# Patient Record
Sex: Male | Born: 1960 | Race: White | Hispanic: No | Smoking: Former smoker
Health system: Southern US, Community
[De-identification: ages and names within clinical notes are randomized; demographics above are authoritative.]

## PROBLEM LIST (undated history)

## (undated) DIAGNOSIS — G4730 Sleep apnea, unspecified: Secondary | ICD-10-CM

## (undated) DIAGNOSIS — G8929 Other chronic pain: Secondary | ICD-10-CM

## (undated) DIAGNOSIS — F419 Anxiety disorder, unspecified: Secondary | ICD-10-CM

## (undated) DIAGNOSIS — Z95810 Presence of automatic (implantable) cardiac defibrillator: Secondary | ICD-10-CM

## (undated) DIAGNOSIS — I209 Angina pectoris, unspecified: Secondary | ICD-10-CM

## (undated) DIAGNOSIS — G629 Polyneuropathy, unspecified: Secondary | ICD-10-CM

## (undated) DIAGNOSIS — I219 Acute myocardial infarction, unspecified: Secondary | ICD-10-CM

## (undated) DIAGNOSIS — I2510 Atherosclerotic heart disease of native coronary artery without angina pectoris: Secondary | ICD-10-CM

## (undated) DIAGNOSIS — K635 Polyp of colon: Secondary | ICD-10-CM

## (undated) DIAGNOSIS — I739 Peripheral vascular disease, unspecified: Secondary | ICD-10-CM

## (undated) DIAGNOSIS — I10 Essential (primary) hypertension: Secondary | ICD-10-CM

## (undated) DIAGNOSIS — E785 Hyperlipidemia, unspecified: Secondary | ICD-10-CM

## (undated) DIAGNOSIS — M549 Dorsalgia, unspecified: Secondary | ICD-10-CM

## (undated) DIAGNOSIS — I509 Heart failure, unspecified: Secondary | ICD-10-CM

## (undated) DIAGNOSIS — F410 Panic disorder [episodic paroxysmal anxiety] without agoraphobia: Secondary | ICD-10-CM

## (undated) HISTORY — DX: Dorsalgia, unspecified: M54.9

## (undated) HISTORY — DX: Hyperlipidemia, unspecified: E78.5

## (undated) HISTORY — DX: Other chronic pain: G89.29

## (undated) HISTORY — DX: Anxiety disorder, unspecified: F41.9

## (undated) HISTORY — DX: Polyp of colon: K63.5

## (undated) HISTORY — PX: CORONARY ANGIOPLASTY WITH STENT PLACEMENT: SHX49

## (undated) HISTORY — DX: Peripheral vascular disease, unspecified: I73.9

## (undated) HISTORY — DX: Acute myocardial infarction, unspecified: I21.9

## (undated) HISTORY — DX: Atherosclerotic heart disease of native coronary artery without angina pectoris: I25.10

---

## 2004-01-11 ENCOUNTER — Inpatient Hospital Stay (HOSPITAL_COMMUNITY): Admission: EM | Admit: 2004-01-11 | Discharge: 2004-01-14 | Payer: Self-pay | Admitting: Emergency Medicine

## 2004-02-08 ENCOUNTER — Ambulatory Visit: Payer: Self-pay | Admitting: *Deleted

## 2004-04-08 ENCOUNTER — Emergency Department (HOSPITAL_COMMUNITY): Admission: EM | Admit: 2004-04-08 | Discharge: 2004-04-08 | Payer: Self-pay | Admitting: Emergency Medicine

## 2004-05-08 ENCOUNTER — Ambulatory Visit: Payer: Self-pay | Admitting: Cardiology

## 2004-05-08 ENCOUNTER — Ambulatory Visit: Payer: Self-pay

## 2004-07-05 ENCOUNTER — Ambulatory Visit: Payer: Self-pay

## 2004-10-26 ENCOUNTER — Ambulatory Visit: Payer: Self-pay | Admitting: Internal Medicine

## 2005-01-15 ENCOUNTER — Ambulatory Visit: Payer: Self-pay | Admitting: Internal Medicine

## 2005-04-09 ENCOUNTER — Ambulatory Visit: Payer: Self-pay | Admitting: Cardiology

## 2005-12-15 ENCOUNTER — Inpatient Hospital Stay (HOSPITAL_COMMUNITY): Admission: EM | Admit: 2005-12-15 | Discharge: 2005-12-19 | Payer: Self-pay | Admitting: Emergency Medicine

## 2005-12-15 ENCOUNTER — Ambulatory Visit: Payer: Self-pay | Admitting: Cardiology

## 2005-12-26 ENCOUNTER — Ambulatory Visit: Payer: Self-pay

## 2005-12-26 ENCOUNTER — Encounter: Payer: Self-pay | Admitting: Cardiology

## 2006-01-02 ENCOUNTER — Ambulatory Visit: Payer: Self-pay | Admitting: Cardiology

## 2007-01-01 ENCOUNTER — Inpatient Hospital Stay (HOSPITAL_COMMUNITY): Admission: EM | Admit: 2007-01-01 | Discharge: 2007-01-03 | Payer: Self-pay | Admitting: Emergency Medicine

## 2007-01-01 ENCOUNTER — Ambulatory Visit: Payer: Self-pay | Admitting: Internal Medicine

## 2007-01-02 ENCOUNTER — Encounter: Payer: Self-pay | Admitting: Internal Medicine

## 2008-05-07 ENCOUNTER — Ambulatory Visit: Payer: Self-pay | Admitting: Cardiology

## 2008-09-06 ENCOUNTER — Encounter (INDEPENDENT_AMBULATORY_CARE_PROVIDER_SITE_OTHER): Payer: Self-pay | Admitting: *Deleted

## 2010-06-30 ENCOUNTER — Telehealth (INDEPENDENT_AMBULATORY_CARE_PROVIDER_SITE_OTHER): Payer: Self-pay | Admitting: *Deleted

## 2010-07-04 NOTE — Progress Notes (Signed)
Summary: Records Request   Faxed OV & Stress to Amy at [facility] ([phone]). Debby Freiberg  June 30, 2010 2:42 PM

## 2010-09-05 NOTE — H&P (Signed)
NAME:  GIBRIL, MASTRO             ACCOUNT NO.:  [id number]   MEDICAL RECORD NO.:  [id number]          PATIENT TYPE:  INP   LOCATION:  2008                         FACILITY:  [facility]   PHYSICIAN:  Bevelyn Buckles. Bensimhon, MDDATE OF BIRTH:  16-Nov-1960   DATE OF ADMISSION:  01/01/2007  DATE OF DISCHARGE:                              HISTORY & PHYSICAL   PRIMARY CARE Georgann Bramble:  Dr. Venetia Maxon in [place], [place].   PRIMARY CARDIOLOGIST:  Dr. Olga Millers.   CHIEF COMPLAINT:  Chest pain.   HISTORY OF PRESENT ILLNESS:  Mr. Campton is a 50 year old male with a  history of coronary artery disease.  He complains of approximately a 10-  day to 2-week history of dizziness, fatigue, shortness of breath,  sweats, chest pain, poor memory, excessive urination.  He has had  episodes of chest pain almost daily.  He gets chest tightness when he  over-exerts himself, and also has episodes in relation to stress.  He  states that they reach a 10/10.  They feel the same as his MI felt, but  do not last as long.  He carries nitroglycerin but takes aspirin for  them.  They resolve in approximately 10 minutes.  He also states that he  has not been recently checking his CBGs and is not consistently  compliant with his medications.  His diet is also poor. He feels that  because of some chronic back pain, he stays active because sitting down  makes his back hurt more.  He came to the hospital today because his  symptoms had begun to concern him because when he had the sweats in  2005, it was a precursor of his MI.   PAST MEDICAL HISTORY:  1. ST segment elevation myocardial infarction in 2005 with      percutaneous transluminal coronary angioplasty to diagonal (right      coronary artery totaled at that time).  2. Non-ST segment elevation myocardial infarction in August 2007 with      a catheterization showing 40% left anterior descending, diagonal      subtotal, obtuse marginal 1 30, circumflex 30, obtuse  marginal 2 of      99% reduced to zero with bare metal stent, right coronary artery      total, ejection fraction 35% to 40%.  3. Peri-infarct BT.  4. Osteoarthritis.  5. Noncompliance with medications.   PAST SURGICAL HISTORY:  Cardiac catheterization.   ALLERGIES:  No known drug allergies.   CURRENT MEDICATIONS:  1. Aspirin 325 mg daily.  2. Sublingual nitroglycerin.  3. Toprol and Glucophage taken at times, he states that he is on his      last few pills and has no refills on his home medications.   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  He has a greater than 50 pack/year  history of ongoing tobacco use.  He denies alcohol or drug abuse.   FAMILY HISTORY:  His mother is alive in her 23s with a history of  coronary artery disease, and  his father is also alive without history of  coronary artery disease and his siblings have no coronary artery  disease.   REVIEW OF SYSTEMS:  Significant for sweats.  He has chronic back pain.  The chest pain as described above.  He has some dyspnea on exertion.  He  coughs at times, but denies wheezing.  He denies PND, orthopnea, or  edema.  Review of systems is otherwise negative.   PHYSICAL EXAMINATION:  VITAL SIGNS:  Temperature 97.9.  Blood pressure  139/87, pulse 97, respiratory rate 18.  O2 saturation 96% on room air.  GENERAL:  Well-developed, well-nourished white male in no acute  distress.  HEENT:  Normal.  NECK:  There is no lymphadenopathy, thyromegaly, bruit or JVD noted.  CV:  His heart is regular rate and rhythm with an S1 and S2.  No  significant murmur, rub, or gallop is noted.  Distal pulses are intact  in all 4 extremities, and no femoral bruits are appreciated.  LUNGS:  He has a few rales in the bases, but they are otherwise clear.  SKIN:  No rashes or lesions are noted.  ABDOMEN:  Soft, nontender, with active bowel sounds.  No  hepatosplenomegaly is  noted.  EXTREMITIES:  There is no cyanosis, clubbing, or edema noted.  MUSCULOSKELETAL:  There are no joint deformities, effusions, and no  spine or CVA tenderness.  NEUROLOGIC:  He is alert and oriented.  Cranial nerves II-XII are  grossly intact.   Chest x-ray:  No acute disease.   EKG:  Sinus rhythm with no acute ischemic changes.  Rate 92.  No change  from an EKG dated August 2007.   LABORATORY DATA:  Sodium 129, potassium 4.0, chloride 100, BUN 17,  creatinine 0.8, glucose 571.  Hemoglobin 16.4, hematocrit 46.8, WBC  10.3, platelets 315.  D-dimer less than 0.22.  Urinalysis negative  except for glucose greater than 1000.   IMPRESSION:  Mr. Rueb is a 50 year old male with known coronary  artery disease and multiple cardiac risk factors that are poorly  controlled.  He stopped taking all medications about 6 months ago, and  now complains of a 2-week history of progressive chest pressure, but no  nocturnal angina.  His ECG and initial point of care markers are  negative, and his blood sugar 571.  He will be admitted, and myocardial  infarction will be ruled out.  Cardiac catheterization will be planned  to reassess his anatomy.  We will treat him with sliding scale insulin  for now, and after catheterization can treat him with  metformin/glipizide 500/5 mg b.i.d.  He will also need a statin as well  as a beta-blocker, an angiotensin-converting enzyme inhibitor and  smoking cessation consultation.  He will also be referred for diabetic  teaching.     [name], PA-C      Bevelyn Buckles. Bensimhon, MD  Electronically Signed   RB/MEDQ  D:  01/01/2007  T:  01/01/2007  Job:  [id number]   cc:   Dr. Valda Favia

## 2010-09-05 NOTE — Assessment & Plan Note (Signed)
[facility]                            CARDIOLOGY OFFICE NOTE   NAME:[name], Lee ECONOMOU                    MRN:          [id number]  DATE:05/07/2008                            DOB:          1960/08/01    [name] is a pleasant gentleman, who has a history of coronary  disease with prior PCIs.  His most recent cardiac catheterization was  performed on January 02, 2007.  At that time, he had a 20% left main.  There is a 50% LAD at the takeoff of the first diagonal.  The mid LAD  has 70% lesion.  The first diagonal branch had a 90% tubular lesion  proximally.  There was 30-40% nondominant circumflex.  The right  coronary artery was subtotally occluded with antegrade flow.  There is a  lesion over a long segment with left-to-right collaterals.  His Ejection  fraction is 60%.  He has not been seen since that time.  There has been  a problem with compliance.  Since I last saw him, there is no dyspnea,  chest pain, palpitations, or syncope.  There is no pedal edema.  Note,  he does have dyspnea with more extreme activities, but not with routine  activities.  This is unchanged in severity and frequency.  It is  relieved promptly with rest.  It is not associated with chest pain.   MEDICATIONS:  1. Clindamycin 300 mg p.o. t.i.d.  2. Metformin.  3. Metoprolol 25 mg p.o. b.i.d.  4. Aspirin.   PHYSICAL EXAMINATION:  VITAL SIGNS:  Blood pressure that is elevated at  163/93.  His pulse is 66.  He weighs 209 pounds.  HEENT:  Normal.  NECK:  Supple.  CHEST:  Clear.  CARDIOVASCULAR:  Regular rate and rhythm.  ABDOMEN:  No tenderness.  EXTREMITIES:  No edema.   His electrocardiogram shows a sinus rhythm at a rate of 62.  There is a  prior septal infarct.  His electrocardiogram is unchanged compared to  January 02, 2006.   DIAGNOSES:  1. Coronary artery disease - [name] has not had any chest pain or      shortness of breath.  We will continue with  medical therapy.  We      will continue with his aspirin and Lopressor.  I will also add      Pravachol 40 mg p.o. daily as well as lisinopril 10 mg daily.  We      will check a BMET in 1 week and lipids and liver in 6 weeks.  We      had a long discussions today concerning compliance with followup as      well as discontinue his tobacco use.  He seems to understand this.  2. Tobacco abuse - We discussed the importance of discontinue this for      between 3-10 minutes.  3. Hypertension - His blood pressure is elevated today.  I am adding      lisinopril and we will check a BMET in 1 week.  4. Hyperlipidemia - We will add Pravachol.  5. Diabetes mellitus -  I have asked him to follow up with his primary      care physician concerning this issue.  6. History of mildly elevated liver functions - We will plan to repeat      these in 12 weeks.   We will see him back in 6 months.     Madolyn Frieze Jens Som, MD, [facility]  Electronically Signed    BSC/MedQ  DD: 05/07/2008  DT: 05/07/2008  Job #: [id number]

## 2010-09-05 NOTE — Discharge Summary (Signed)
NAME:  [name], [name]             ACCOUNT NO.:  [id number]   MEDICAL RECORD NO.:  [id number]          PATIENT TYPE:  INP   LOCATION:  2008                         FACILITY[facility]  [facility]   PHYSICIAN:  Lee Frieze. Lee Som, MD, FACCDATE OF BIRTH:  1960-10-06   DATE OF ADMISSION:  01/01/2007  DATE OF DISCHARGE:  01/03/2007                               DISCHARGE SUMMARY   PROCEDURES:  1. Cardiac catheterization.  2. Coronary arteriogram.  3. Left ventriculogram.  4. Two-D echocardiogram.   DISCHARGE DIAGNOSIS:  Chest pain, medical therapy for coronary artery  disease.   SECONDARY DIAGNOSES:  1. ST elevation myocardial infarction in 2005 with percutaneous      intervention to diagonal, right coronary artery totaled.  2. Non-ST segment elevation myocardial infarction August 2007 with      bare-metal stent to the obtuse marginal 2, ejection fraction 35-40.  3. Peri-infarct ventricular tachycardia.  4. Arthritis.  5. History of noncompliance  6. Ongoing tobacco use.  7. Family history of coronary artery disease.  8. Hyperlipidemia with a total cholesterol of 278, triglycerides 1054,      HDL 22.  9. Abnormal liver function.   Time of discharge 42 minutes.   [name] COURSE:  [name] is a 50 year old male with known coronary  artery disease.  He had been having more frequent chest pain and came to  the [name] where he was admitted for further evaluation.   His cardiac enzymes were negative for MI.  Cardiac catheterization was  performed which showed 20% left main, LAD 70% in the midportion, 90%  first diagonal, circumflex 30-40% with a patent stent in the RCA  subtotal with left-to-right collaterals.  His EF was 60%.  The films  were reviewed with Dr. Excell [name] and consideration of a stent to the mid  LAD for the 70% lesion.  The first diagonal has a 90% stenosis, but it  is a small vessel.  Dr. Excell [name] and Dr. Eden [name] decided that the LAD  lesion was not flow limiting.  Medical  therapy was recommended, and  compliance is to be emphasized.   An echocardiogram was performed which showed an EF of 60% and no  significant valvular abnormalities although it was a technically limited  study.  A smoking cessation consult was called.  [name] states that  he has a prescription for Chantix and will follow up with his family  physician.  A hemoglobin A1c was checked and was 11.1.  Of note, he had  abnormal liver function testing with SGPT of 61, but other CMET values  were within normal limits.  He is to follow up with his family physician  and possibly GI.  Of note, his blood sugar on admission was 571.   On January 03, 2007, since [name] was evaluated by Dr. Jens [name].  His chest pain, shortness of breath, nausea and abdominal pain had all  resolved.  He was evaluated by Dr. Jens [name] and considered stable for  discharge with outpatient follow-up arranged.   DISCHARGE INSTRUCTIONS:  His activity level is to be increased  gradually.  He is to stick to  a low-fat, diabetic diet.  He is to call  our office for any problems with the catheterization site.  He is to  follow up with [name] in 1 week.  He is to see Dr. Jens [name] on  October 1 at August 15.   DISCHARGE MEDICATIONS:  1. Aspirin 325 mg daily.  2. Imdur 30 mg daily.  3. Toprol-XL 50 mg daily.  4. Pravachol 40 mg daily.  5. Glucophage 1000 mg b.i.d., restart September 14.  6. Glipizide 5 mg b.i.d.  7. Lisinopril 5 mg daily.  8. Glipizide 5 mg b.i.d.   Because of financial considerations, [name] medications are all  [facility] $4 prescriptions.      Lee Demark, PA-C      Lee Frieze. Lee Som, MD, [name]  Electronically Signed    RB/MEDQ  D:  01/03/2007  T:  01/04/2007  Job:  [id number]   cc:   [name]. [name], M.D.

## 2010-09-05 NOTE — Cardiovascular Report (Signed)
NAME:  [name], [name]             ACCOUNT NO.:  [id number]   MEDICAL RECORD NO.:  [id number]          PATIENT TYPE:  INP   LOCATION:  2008                         FACILITY:  [facility]   PHYSICIAN:  Noralyn Pick. Eden Emms, MD, FACCDATE OF BIRTH:  1961-02-22   DATE OF PROCEDURE:  DATE OF DISCHARGE:                            CARDIAC CATHETERIZATION   PROCEDURE:  Coronary arteriography   INDICATIONS:  A 46-year patient with known coronary artery disease  admitted with chest pain.   Cine catheterization was done 6-French catheter from right femoral  artery.  The patient was extremely nervous.  He was sedated with 5 mg of  Versed.   Left main coronary artery had a 20% discrete lesion.   The left anterior descending artery had a 50% lesion after the takeoff  of the first diagonal branch.  The mid LAD had a 70% lesion, distal LAD  was normal.  The first diagonal branch had 90% tubular disease  proximally.  Circumflex coronary was nondominant.  There is 30-40%  tubular disease in the midvessel.  There is a stent in the mid circ and  first obtuse marginal branch segment which was widely patent.   The right coronary artery was dominant.  The distal right coronary  artery was subtotally occluded.  There was antegrade flow.  There is a  lesion over a long segment with left-to-right collaterals.   RAO VENTRICULOGRAPHY:  RAO ventriculography was somewhat suboptimal to  catheter at the base of the ventricle.  However, LV function appeared  normal with an EF of 60%.  Aortic pressure was 145/88, LV pressure was  150/11.   IMPRESSION:  I will review the films with Dr. Excell Seltzer.  The LAD lesion  looks flow-limiting to me.  I suspect he would benefit from a non drug-  eluting stent to the mid-LAD.  He has been very noncompliant with his  medications as seen with his uncontrolled diabetes, continued smoking  and lack of Plavix therapy.  The stent in the circ was a non drug-  eluting stent.   I would  probably leave the right coronary artery alone as it was  previously described as subtotally occluded and has reasonable  collaterals.      Noralyn Pick. Eden Emms, MD, [facility]  Electronically Signed     PCN/MEDQ  D:  01/02/2007  T:  01/02/2007  Job:  [id number]

## 2010-09-08 NOTE — Cardiovascular Report (Signed)
NAMEERCEL, PEPITONE             ACCOUNT NO.:  [id number]   MEDICAL RECORD NO.:  [id number]          PATIENT TYPE:  INP   LOCATION:  2923                         FACILITY:  [facility]   PHYSICIAN:  Rollene Rotunda, M.D.   DATE OF BIRTH:  [birth year]   DATE OF PROCEDURE:  01/11/2004  DATE OF DISCHARGE:                              CARDIAC CATHETERIZATION   PRIMARY CARE PHYSICIAN:  None.   PROCEDURE:  Left heart catheterization/coronary arteriography.   INDICATIONS:  Evaluate patient with acute anterolateral MI.   PROCEDURE NOTE:  Left heart catheterization performed the via the right  femoral artery.  The artery was cannulated using anterior wall puncture.  A  #6-French arterial sheath was inserted via the modified Seldinger technique.  Preformed Judkins and a pigtail catheter were utilized.  The patient  tolerated procedure well and left the laboratory in stable condition.   RESULTS:  HEMODYNAMICS:  LV 127/21.  AO 133/84.   CORONARIES:  The left main was normal.   The LAD had 30% stenosis at a first diagonal.  There was 40-50% stenosis  proximally after this.  The first diagonal was occluded at the ostium and  appeared to be an acute occlusion.   The circumflex had diffuse luminal irregularities in the AV groove.  Ramus  intermediate was small and normal.  OM1 was small and normal.  OM2 was large  and normal.   The right coronary artery was a dominant vessel.  There was distal  occlusion.   LEFT VENTRICULOGRAM:  The left ventriculogram was obtained in the RAO  projection.  The EF was 55% with anterolateral akinesis.   CONCLUSION:  Two vessel coronary artery disease with the culprit being a  diagonal lesion.   PLAN:  The patient will have percutaneous revascularization of the first  diagonal with follow-up medical management.     JH/MEDQ  D:  01/11/2004  T:  01/12/2004  Job:  [id number]

## 2010-09-08 NOTE — Discharge Summary (Signed)
NAME:  OLUWAFERANMI, WAIN             ACCOUNT NO.:  [id number]   MEDICAL RECORD NO.:  [id number]          PATIENT TYPE:  INP   LOCATION:  3705                         FACILITY:  [facility]   PHYSICIAN:  Olga Millers, M.D. LHCDATE OF BIRTH:  11/15/1960   DATE OF ADMISSION:  01/11/2004  DATE OF DISCHARGE:  01/13/2004                           DISCHARGE SUMMARY - REFERRING   PROCEDURE:  Emergent percutaneous intervention first diagonal September 20.   REASON FOR ADMISSION:  Mr. Arreguin is a 50 year old male with no prior  cardiac history but multiple cardiac risk factors notable for history of  diabetes mellitus and hypertension, who presented with acute lateral  myocardial infarction.  Please refer to admission note for full details.   LABORATORY DATA:  Peak CPK 2540/158; troponin I 98.6.  Lipid profile total  cholesterol 205, triglycerides 706, HDL 27.  Hemoglobin A1C 10.6.  Sodium  135, potassium 4, glucose 262, BUN 9, creatinine 0.8 at discharge.  WBC  11.9, hemoglobin 13.6, hematocrit 39, platelet 230 at discharge.  TSH 1.99.  Admission chest x-ray showed no acute disease.   HOSPITAL COURSE:  Following stabilization on medication regimen consisting  of aspirin, beta blocker, heparin, nitroglycerin, the patient was taken  directly to the cardiac catheterization where he underwent diagnostic  coronary angiography by Dr. Antoine Poche (see report for full details).  Coronary anatomy notable for total occlusion of the ostial first diagonal as  well as distal occlusion of the RCA.  LV function was preserved (55%),  anterolateral akinesis.  The patient then underwent subsequent percutaneous  intervention by Dr. Riley Kill with dilatation of the 100% first diagonal  lesion to approximately 40% residual stenosis.  Dr. Riley Kill was unable to  pass a mini-vision stent.  There were no noted complications.  The patient  was enrolled in the Triton study.   Postoperative course essentially benign.  The  patient had no recurrent chest  pain.  No dysrhythmia noted.  The patient did develop a right groin hematoma  with presence of bruit on the morning of scheduled discharge.  An ultrasound  of the groin was ordered and, if negative, the patient will be discharged  later today.  The patient did have mildly elevated liver enzymes (AST 40,  ALT 62).  Recommendation is to repeat these in the next several weeks.  Given this finding, Lipitor was cut back from 80 to 40 at the time of  discharge.   The patient was also referred for smoking cessation.  He will be discharged  home on Wellbutrin.   The patient was on no medications prior to admission.  He was referred for  diabetic teaching with recommendation to discharge on both Glucophage and  Glucotrol.  The patient does have a Glucometer at home and does know how to  use this.  Arrangements will also be made for him to establish with a  primary care physician in [place] for close monitoring of diabetes.   DISCHARGE MEDICATIONS:  Triton study drug, coated aspirin 325 mg daily,  Toprol XL 50 mg daily, Lipitor 40 mg daily, Glucophage 500 mg b.i.d.,  Glucotrol XL 5 mg daily,  Wellbutrin 150 mg as directed, Nitrostat 0.4 mg  p.r.n., Darvocet N100, 1-2 tablets p.r.n.   DISCHARGE INSTRUCTIONS:  No heavy lifting or strenuous activity or return to  work until seen by physician.  The patient is to refrain from driving for at  least one week.  The patient is scheduled to follow up with Dr. Olga Millers Friday, October 7, at 1:45 p.m.  He will need to be scheduled for  follow up liver enzymes.  The patient is scheduled to establish with Dr.  Gwendlyn Deutscher of [facility] on Tuesday, October 4, at 8:50  a.m.   DISCHARGE DIAGNOSIS:  1.  Status post acute lateral myocardial infarction.      1.  Emergent percutaneous intervention 100% ostial first diagonal,          Triton study.      2.  Residual 100% distal right coronary artery.       3.  Preserved left ventricular function (ejection fraction 55%).      4.  Right groin hematoma/bruit.  2.  Type 2 diabetes mellitus.  3.  Mixed dyslipidemia.  4.  Tobacco.  5.  Mildly elevated liver enzymes.       GS/MEDQ  D:  01/13/2004  T:  01/13/2004  Job:  [id number]   cc:   Durenda Hurt, M.D.  [address]  [place], [place] [address]  Fax: [phone]

## 2010-09-08 NOTE — Assessment & Plan Note (Signed)
[facility]                              CARDIOLOGY OFFICE NOTE   NAME:[name], [name]                    MRN:          [id number]  DATE:01/02/2006                            DOB:          [birth year]    [name] is a pleasant gentleman who has a history of coronary disease.  He recently had a myocardial infarction at [facility], and was seen down  there and discharged on medications.  He was to follow up here, but had a  second infarct prior to being evaluated.  He was admitted to the hospital on  December 15, 2004, and did rule in for myocardial infarction.  A  catheterization revealed an ejection fraction of 40%.  There was a  35% to  40% LAD, and the diagonal was subtotally occluded.  There was some late  filling compatible with restenosis of the small caliber vessel.  The  circumflex had a 30% ostial narrowing, and then subtotal occlusion  consistent with a ruptured plaque.  The right coronary artery had  recannulization.  The patient subsequently had a Liberte stent placed in the  circumflex.  He did have a followup echocardiogram that showed normal LV  function.  This was performed on December 26, 2005.  Since that time he has  not had chest pain or shortness of breath, though he does smoke.   MEDICATIONS:  1. Aspirin 325 mg p.o. daily.  2. Toprol 25 mg p.o. daily.  3. Plavix 75 mg p.o. daily.  4. Metformin 500 mg p.o. b.i.d.  5. Crestor 10 mg p.o. daily.   PHYSICAL EXAMINATION:  Shows a blood pressure of 142/84 and his pulse is 85.  NECK:  Supple with no bruits.  CHEST:  Clear.  CARDIOVASCULAR:  Exam shows a regular rate and rhythm.  EXTREMITIES:  Show no edema.   His electrocardiogram shows a sinus rhythm in a rate of 85.  There is a  prior septal infarct and a prior lateral infarct cannot be excluded.  There  are no ST changes noted.   DIAGNOSES:  1. Coronary artery disease.  2. Tobacco abuse.  3. Diabetes mellitus.  4.  Hypertension.  5. Hyperlipidemia.  6. History of mildly elevated liver functions.   PLAN:  [name] is doing well since he was discharged.  We will add  lisinopril for his history of mildly reduced left ventricular function,  although improved on most recent echocardiogram.  It should be noted he is  also diabetic and also has coronary disease.  We will check a CMET and  lipids in 1 week.  We should adjust his statin to a goal LDL of less than  70, given his history of coronary disease.  We will need to follow his liver  functions closely, as they have been mildly elevated previously.  We  discussed the importance of diet and exercise as well as discontinuing his  tobacco use.  I will see him back in 3 months, and we will most likely  titrate his medications at that time.  Madolyn Frieze Lee Som, MD, [facility]    BSC/MedQ  DD:  01/02/2006  DT:  01/03/2006  Job #:  [id number]   cc:   Danae Orleans. Venetia Maxon, M.D.

## 2010-09-08 NOTE — H&P (Signed)
NAME:  [name], [name]             ACCOUNT NO.:  [id number]   MEDICAL RECORD NO.:  [id number]          PATIENT TYPE:  INP   LOCATION:  2010                         FACILITY:  [facility]   PHYSICIAN:  Marrian Salvage. Freida Busman, MD     DATE OF BIRTH:  1960/10/20   DATE OF ADMISSION:  12/15/2005  DATE OF DISCHARGE:                                HISTORY & PHYSICAL   CARDIOLOGIST:  Madolyn Frieze. Jens Som, MD.   PRIMARY MEDICAL DOCTOR:  Dr. Venetia Maxon at the [facility] in [place].   CHIEF COMPLAINT:  Chest pain with crescendo angina.   HISTORY OF PRESENT ILLNESS:  The patient is a 50 year old male with multiple  medical problems for his age.  He has a history of coronary disease.  January 11, 2004 he presented with an acute ST elevation MI laterally.  He  was stabilized on medications and taken urgently to the catheterization lab.  There ejection fraction was 55%.  The distal RCA was 100% occluded.  The  left main was normal.  LAD had a 30% lesion after D-1, then 40-50% lesions  in the mid vessel.  The first diagonal had an ostial occlusion that was  thought to be acute.  Left circumflex had some minor irregularities.  The OM-  2 was a large vessel and normal.  He underwent balloon angioplasty to the  first diagonal with reduction in the stenosis from 100% to 40%.  However,  after multiple attempts no stent was able to be passed across the lesion.  He was then treated with medical treatment; and ultimately discharged home 2  days later.  He did participate in the Triton study.   The patient did relatively well afterwards.  However he stopped taking all  his medications except for aspirin.  He was also found to have a hemoglobin  A1c of 10.7 during his admission to the hospital in 2005 and received no  further treatment for diabetes.  He also was a smoker and has continued to  smoke.   The patient did well without recurrent cardiac problems until about 1 week  ago.  He went to [facility] in [place] for vacation.  On Saturday  August 18 he started having chest pain while playing put-put golf.  This  pain was fairly severe, and consequently they called an ambulance.  He was  given nitroglycerin en route with some relief.  He was ultimately admitted  to the [facility] in [place] for 2 days.  Initially his CK  was 78 with a troponin of 0.6, but his troponin rose to 3.5; and he was told  that he had a small heart attack.  He was placed back on medical therapy and  also given Plavix.  He had no further chest pain during the admission.  He  did not undergo cardiac catheterization.  He was also noted to have blood  sugars that were very high up to 640.  Again, he was treated with  medications including Plavix; and he was also started on metformin.  He was  told to followup with his primary  care doctor as well as his cardiologist in  [place] in the next week.   The patient has been taking it easy since arriving back in [place] on  Tuesday August 21.  He is set up an appointment with his primary care doctor  for Thursday, August 23, but then missed that appointment.  He states he has  been taking the medications that they prescribed and he did fill his  prescriptions.  He has noticed; however, that with any exercise or exertion  he feels malaise, weakness, and burning chest pain.  This pain radiates to  both arms.  Today he went out with his family to dinner; and afterwards was  doing some walking in the 100-degrees weather.  He felt more chest  discomfort and malaise.  Consequently he was concerned, and had his wife  bring him to the emergency department.   In the ED an EKG showed lateral Q waves with some persistent ST depressions  in those leads.  His troponin was mildly elevated at 0.23 and his MB was  also mildly elevated at 5.7.  He was started on heparin and admitted to the  cardiac service.   PAST MEDICAL HISTORY:  1. Coronary artery disease as  above.  2. Diabetes type 2 uncontrolled with an A1c of 10.7 in 2005 and no recent      therapy.  3. Hypertension.  4. Medical noncompliance.  5. Hyperlipidemia.  6. Ongoing tobacco abuse.  7. Arthritis of the lumbar spine.   PAST SURGICAL HISTORY:  No prior surgeries.   Echocardiogram at the outside hospital December 10, 2005 showed an ejection  fraction of 50-55% with concentric LVH, mild mitral regurgitation; and no  obvious segmental wall motion abnormalities but a note that there was poor  imaging with difficulty defining the endocardial borders.   ALLERGIES:  No known drug allergies.   CURRENT MEDICATIONS:  The patient was taking only aspirin up to 1 week ago.   MEDICATIONS:  The patient was discharged from the hospital and started on  the following medications over the last few days:  1. Lisinopril 20 mg daily.  2. Atenolol 25 mg daily.  3. Plavix 75 mg daily.  4. Metformin 500 mg b.i.d.  5. Crestor 10 mg daily.  6. Aspirin 81 mg daily.   SOCIAL HISTORY:  The patient lives in [place] with his parents.  He was  recently separated from his wife and they are in divorce proceedings.  They  have two children.  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   The patient continues to smoke 1 pack per day and has smoked up to 2 packs  per day for the last 25 years.  He rarely drinks alcohol.  He used to drink  more, but has never had a history of withdrawal or detox.  No drug use.  He  specifically denied cocaine.  He remains relatively active but does not  formally exercise.   FAMILY HISTORY:  The patient's mother is alive and well at age 13 with a  history of coronary disease.  His father is alive and well at age 69 with  diabetes.  He has two sisters and one brother who are healthy.  There is no  early onset coronary disease in his family, except for him.  REVIEW OF SYSTEMS:  Positive for chest pain.  Otherwise  14-system review is  negative in detail except as  outlined above.  Of note, the patient has not  had significant polyuria or thirst.  He has had no numbness in the feet.  No  problems with abdominal fullness.  No significant shortness of breath.   ADVANCED DIRECTIVES:  Full code.   PHYSICAL EXAMINATION TODAY:  VITAL SIGNS:  Temperature 97.8, pulse 52, blood  pressure 116/79, respirations 22, saturation 96% on room air.  GENERAL:  The patient was in no distress.  No rash.  No jaundice.  HEENT:  He had no oral lesions.  NECK:  His JVP was flat.  No carotid bruits.  Thyroid without enlargement.  LUNGS:  Clear to auscultation bilaterally.  HEART:  Regular rate and rhythm with normal S1-S2.  No S4, and no murmur.  ABDOMEN:  Soft, nontender with no hepatomegaly and normal bowel sounds.  GENITALIA:  His right groin had no bruit, and a 2+ pulse.  NEUROLOGICALLY:  He was grossly intact.  EXTREMITIES:  He had no peripheral edema and was warm.   CHEST X-RAY:  From the outside hospital on August 19 showed borderline  cardiomegaly, but no other abnormalities.   EKG:  Showed normal sinus rhythm at 60 with normal axis, normal intervals.  A Q wave in AVL and lead I with ST elevation upsloping, in V2, V3 which  appeared old, as well as ST depressions in V5, V6 which were new, and T wave  inversions in I and aVL that were new.  This was in comparison to an EKG  dated April 08, 2006.   LABORATORIES:  Here showed a PTT of 30, a PT of 13, an MB of 5.7, a CK of  69, a troponin I of 0.23, creatinine 0.9.  Sodium 136, potassium 4.2, BUN  17, glucose 153, bicarb 23.  White blood cell count was 12, hematocrit 44,  platelets 301.  His initial troponin I, on point of care testing at  presentation, was negative.  The patient's lipids from last week at the  outside hospital showed total cholesterol 145, triglycerides 302, HDL 22,  and LDL 63.   IMPRESSION:  A 50 year old man with known coronary disease as  well as type 2  diabetes and ongoing smoking who basically has not taken any of his coronary  disease secondary prevention medications over the last 2 years, nor has he  taken any diabetic treatments.  He now presents with non-ST elevation  myocardial infarction.   Problem #1.  Non-ST-elevation myocardial infarction.  The patient has a good  story.  His EKG shows significant changes although it is unclear whether  these are acute or are related to his prior Q wave infarction or truly  represent new change.  Regardless he should be treated aggressively for  acute coronary syndrome.  He is admitted to the telemetry unit.  We will  continue to cycle his cardiac markers.  He is currently chest pain free  fortunately.  He will get nitrates as needed.  He is on aspirin and will  continue his Plavix which was started last week.  He has been ordered for IV  heparin; given positive troponins, I think initiation of 2b3a inhibitor is indicated.  Will also continue his ACE inhibitor and beta blocker.  Will  continue his statin therapy.  He should get cardiac catheterization on  Monday.  Long-term the patient needs obviously to be on a good regimen of  secondary prevention; and take better care of his diabetes, as well as quit  smoking.  Problem #2.  Diabetes.  Will hold his metformin.  He will get Accu-Cheks.  I  have started him on Lantus 5 units empirically;  and he may need more than  that.  He will also get a sliding scale.  He written for an ADA carbohydrate  modified diet.  As above, he is started on an ACE inhibitor and statin.  Fortunately despite the high A1c in the past; he has no evidence of  neuropathy or nephropathy.   Problem #3.  Hypertension.  Blood pressure is actually quite well  controlled.  Will continue the ACE inhibitor and beta blocker.   Problem #4.  Hyperlipidemia with hypertriglyceridemia.  Continue the statin  as above.  The patient will get some counseling about diet.   Ultimately he  may need to have a low-dose __________ niacin to his regimen as well.   Problem #5.  Ongoing tobacco abuse.  Counseling was provided.  He declined  nicotine for now.  Further counseling should be done prior to discharge.   Problem #6.  Noncompliance.  The patient was counseled about the importance  of secondary prevention with multiple medications.  We have made sure to  keep him on generic medications as possible except for Plavix.  He should  also have his metformin renewed prior to discharge.  It he is having  problems with this, we can consider having social work see him.   Problem #7.  Prophylaxis.  On heparin.  GI--acid suppression as needed if he  is n.p.o..  Out of bed if tolerated, but no heavy exertion.   DISPOSITION:  Cardiac catheterization for Monday pending.  Potentially home  a day or two after that.           ______________________________  Marrian Salvage. Freida Busman, MD     LAA/MEDQ  D:  12/15/2005  T:  12/16/2005  Job:  [id number]   cc:   Dr. Venetia Maxon

## 2010-09-08 NOTE — Cardiovascular Report (Signed)
NAME:  Caul, Dabney             ACCOUNT NO.:  [id number]   MEDICAL RECORD NO.:  [id number]          PATIENT TYPE:  INP   LOCATION:  6532                         FACILITY:  [facility]   PHYSICIAN:  Arturo Morton. Riley Kill, MD, FACCDATE OF BIRTH:  06-30-1960   DATE OF PROCEDURE:  DATE OF DISCHARGE:                              CARDIAC CATHETERIZATION   INDICATIONS:  Mr. Passage is a 49 year old who presents with a non-ST-  elevation MI.  He has had previous acute intervention of the diagonal which  was a small caliber vessel.  The stent could not be placed.  He had known  total occlusion of the right coronary artery.  He unfortunately has  diabetes, and has continued to smoke.  In addition, he stopped his medicines  according to his wife because his diet was better and he was under the  understanding that he would likely not need medications if he improved his  diet.  He has tried to stop smoking but has been unsuccessful.   PROCEDURE:  1. Left heart catheterization  2. Selective coronary arteriography.  3. Selective left ventriculography.  4. PTCA and stenting of the circumflex marginal with a non DES 16 x 3-0      Liberty stent taken to 3.5 mm post dilatation.   DESCRIPTION OF PROCEDURE:  The patient was brought to the catheterization  laboratory, prepped and draped in usual fashion.  Through an anterior  puncture the right femoral artery was easily entered with a anterior wall  stick.  A 6-French sheath was then placed.  Views of the left and right  coronary arteries were then obtained in multiple angiographic projections.  Central aortic and left ventricular pressures were performed, and  ventriculography was performed in the RAO projection.  The patient had  evidence of progression of disease with a subtotal occlusion of the  circumflex.  There was also somewhat reduced flow in the diagonal, but there  appeared to be some late filling in of the distal diagonal suggesting  collaterals  of a restenotic diffusely diseased lesion.  The LAD did not  appear to be critical, although clearly present, and the right coronary  artery was subtotally occluded, but now recanalized to some extent.  There  was some collaterals from the right to the left.  I reviewed the case, and  the pictures with Dr. Jens Som, Dr. Juanda Chance, and subsequently Dr. Ladona Ridgel.  Given the question of compliance in the past, it was felt that we should  likely treat the patient with a non drug-eluding stent.  There was also  short fairly discrete lesion in the circumflex.  We also talked about the  possibility of revascularization surgery as another option, but the LAD was  not critical, and we discussed this with the patient.  These options were  reviewed.  The patient was fairly adamant about not wanting a bypass  operation at this time.  We also asked Dr. Ladona Ridgel to review the findings in  light of the LV dysfunction and peri-infarct VT.  The recommendation was to  monitor the rhythm, revascularize the circumflex, and optimize the medical  management with follow-up EF in a few weeks to month.  We then made  preparations for percutaneous intervention.  A JL-35 guiding catheter was  utilized.  The patient was on and eptifibatide drip.  Heparin was given  according to protocol and ACT checked.  The patient was getting oral Plavix,  but an additional 300 mg Plavix load was administered.  The lesion was  crossed with a Prowater wire and predilatation done with a 2.5-mm balloon.  We then placed a 16 x 3-0 Liberty stent.  This was taken up to 14  atmospheres then post dilated using a 3/5 Quantum Maverick to 13 atmospheres  as well.  There was an excellent angiographic appearance.  All catheters  were subsequently removed and the femoral sheath was sewn into place.  I  spoke with the patient's wife about the findings.  He was taken to the  holding area in satisfactory clinical condition.   HEMODYNAMIC DATA:  1.  Central aortic pressure was 121/77, mean 96.  2. Left ventricular pressure 112/18.  3. No significant gradient pullback across aortic valve angiographic data   ANGIOGRAPHIC DATA:  1. Ventriculography was done in the RAO projection.  Estimated ejection      fraction would be in the range of 35-40%.  Because of ventricular      ectopy was difficult to calculate.  The mid anterolateral apical      segment appeared to be severely hypo to akinetic.  The apex itself      moved.  There was mid inferior hypokinesis.  2. The left main is free of critical disease.  3. The left anterior descending artery courses to the apex.  After the      septal perforator, there is an area of segmental plaque of about 35-      40%.  The diagonal was subtotally occluded.  Careful analysis the      diagonal suggests some late refilling compatible with restenosis of the      small caliber vessel.  4. The circumflex provides a first marginal branch with about 30% ostial      narrowing.  There is then about 30% mid narrowing leading into the      large second marginal and a subtotal occlusion appears to be a ruptured      plaque.  The distal circumflex opens up.  The AV circumflex provides      some collateralization of the distal right.  The right itself is a      small-caliber vessel, and there is recanalization of this artery with      competitive filling.   Following percutaneous stenting with a 3.0 Liberty stent taken to 3.5, the  vessel was opened up and there was marked improvement the appearance of the  artery.  This was reduced to 99 down to 0%.  There did not appear to be edge  tear.   CONCLUSIONS:  1. Moderate reduction in left ventricular function.  2. Significant three-vessel disease with noncritical disease of the LAD,      restenosis of a small caliber diagonal vein branch, due high-grade      circumflex lesion, and recanalization of the right coronary artery was     successful percutaneous  stenting of the circumflex.   PLAN:  I reviewed the findings and recommendations very carefully with my  colleagues.  We plan to treat him aggressively medically.  He will need to  be watched the next 48 hours.  We  elected to use a non drug-eluding platform  despite his diabetes because of a questionable history of compliance in the  past.  Hopefully, he will improve his diabetic control as well as his  smoking and will be able to be followed.  Follow-up ejection fraction by  echocardiography will be required and I have discussed this with Dr.  Jens Som.  We will keep him in the hospital for 48 hours to review his  rhythm.      Arturo Morton. Riley Kill, MD, [facility]  Electronically Signed     TDS/MEDQ  D:  12/17/2005  T:  12/17/2005  Job:  [id number]   cc:   Madolyn Frieze. Jens Som, MD, [facility]  CV Laboratory

## 2010-09-08 NOTE — Cardiovascular Report (Signed)
NAME:  Ferrara, Mccabe             ACCOUNT NO.:  [id number]   MEDICAL RECORD NO.:  [id number]          PATIENT TYPE:  INP   LOCATION:  1824                         FACILITY:  [facility]   PHYSICIAN:  Arturo Morton. Riley Kill, M.D. [employer] OF BIRTH:  August 17, 1960   DATE OF PROCEDURE:  01/11/2004  DATE OF DISCHARGE:                              CARDIAC CATHETERIZATION   HISTORY OF PRESENT ILLNESS:  Mr. Yepiz has had chest pain for several  days.  He presented with acute chest pain and ST elevation in lead I and aVL  with inferior ST depression.  He was noted by Dr. Antoine Poche to have an  occluded diagonal and urgent percutaneous intervention was recommended.   PROCEDURE:  Percutaneous angioplasty of the diagonal branch.  The patient  has had an indwelling 6 French sheath.  A JL4 Cordis guiding catheter was  utilized.  We were unable to cross with a high torque floppy wire but a high  torque intermediate wire was able to get access distally.  We had initial  trouble getting a balloon across but subsequently we were able to get across  a 2.0 balloon.  A 2.0 x 10 mm balloon was taken up to 3 or 4 atmospheres and  then demonstrated flow distally.  A 1.5 x 15 mm balloon was then taken up  and multiple inflations performed.  Subsequent balloons included a 2.0 and  2.25 mm balloons.  A 2.25 Quantum Maverick taken up to about 8 to 9  atmospheres was then performed to try to get full opposition.  We also tried  to pass a 2.0 18 Minivision stent several times but were never able to get  it beyond the first bend.  The lesion appeared to be relatively stable with  TIMI-3 flow and we elected not to attempt further attempts to get across  with the stent.  There was a fair amount of luminal irregularity in the  native LAD.  However, critical stenosis was not demonstrated.  The patient  had TIMI-0 flow at the beginning of the procedure and TIMI-3 flow at the  completion of the procedure.  He continued to have some  chest pain requiring  Fentanyl.  The stenosis went from 100% down to 40%.   CONCLUSION:  Successful percutaneous angioplasty of the diagonal branch in  the setting of acute myocardial infarction.   DISPOSITION:  The patient has multiple risk factors.  In addition, he has  poor dentition.  He also smokes 1-1/2 packs of cigarettes a day.  Lifestyle  modification will be necessary to improve on the outcome.       TDS/MEDQ  D:  01/11/2004  T:  01/12/2004  Job:  [id number]   cc:   Olga Millers, M.D. [facility]   Rollene Rotunda, M.D.

## 2010-09-08 NOTE — H&P (Signed)
NAME:  [name], [name]                       ACCOUNT NO.:  [id number]   MEDICAL RECORD NO.:  [id number]                   PATIENT TYPE:  INP   LOCATION:  1824                                 FACILITY:  [facility]   PHYSICIAN:  Olga Millers, M.D. [facility]            DATE OF BIRTH:  March 07, 1961   DATE OF ADMISSION:  01/11/2004  DATE OF DISCHARGE:                                HISTORY & PHYSICAL   HISTORY OF PRESENT ILLNESS:  [name] is a 50 year old male with a past  medical history of diabetes mellitus and hypertension who presents with an  acute lateral myocardial infarction.  He has no prior cardiac history.  Over  the past 48 hours, he has described intermittent chest pain that he felt was  indigestion in the substernal location and radiates to the left upper  extremity.  There is associated nausea, vomiting, shortness of breath, and  diaphoresis.  The pain did increase with lying flat, and there was some  improvement with sitting up.  He also states that he thought it was improved  with Tums.  However, he had recurrent symptoms beginning at approximately 6  o'clock this morning.  He presented to the emergency room, and his  electrocardiogram reveals lateral ST elevation.  He is presently having  ongoing pain.   MEDICATIONS:  He is on no medications.   ALLERGIES:  No known drug allergies.   SOCIAL HISTORY:  He does smoke, but he denies any alcohol use in the past 2-  3 years.  He has a remote history of cocaine use, but none recently by his  report.   FAMILY HISTORY:  Negative for coronary artery disease.   PAST MEDICAL HISTORY:  Significant for diabetes mellitus and hypertension,  but he denies any hyperlipidemia.  There is no prior cardiac history.   PAST SURGICAL HISTORY:  He has had no previous surgery.   REVIEW OF SYSTEMS:  He denies any headaches, fevers, or chills.  There is no  productive cough or hemoptysis.  There is no dysphagia or odynophagia,  melena, or  hematochezia.  There is no dysuria or hematuria.  There is no  rash or seizure activity.  There is no orthopnea, PND, or pedal edema.  The  remaining systems are negative.   PHYSICAL EXAMINATION:  VITAL SIGNS:  Blood pressure of 160/80, pulse 93.  GENERAL:  He is well-developed, and in mild distress.  He is mildly  diaphoretic at the time of this evaluation, his skin is otherwise  unremarkable.  He is mildly anxious.  There is no peripheral clubbing.  HEENT:  Unremarkable with normal eyelids.  NECK:  Supple with __________ bruits noted.  There was no jugular venous  distention and no thyromegaly noted.  CHEST:  Clear to auscultation __________.  CARDIOVASCULAR:  Regular rate and rhythm with normal S1 and S2.  There are  no murmurs, rubs, or gallops noted.  ABDOMEN:  Non-tender.  Positive bowel sounds.  No hepatosplenomegaly.  No  mass appreciated.  There was no abdominal bruit.  He has 2+ femoral pulses  bilaterally, and no bruits.  EXTREMITIES:  No edema.  I can palpate no cords.  He has 2+ dorsalis pedis  pulses bilaterally.  NEUROLOGIC:  Grossly intact.   His electrocardiogram shows a sinus rhythm at a rate of 95.  There is  lateral ST elevation of 2 mm with reciprocal ST depression in the inferior  leads.   DIAGNOSES:  1.  Acute lateral myocardial infarction.  2.  Diabetes mellitus.  3.  Hypertension.   PLAN:  [name] presents with an acute lateral infarction.  We will treat  with aspirin, heparin, nitroglycerin, beta blockade, and statin.  The risks  and benefits of emergent cardiac catheterization have been discussed, and he  agrees to proceed.  We have discussed the importance of discontinuing his  tobacco use, and we will check lipids.  We will also need to follow CBGs.       BC/MEDQ  D:  01/11/2004  T:  01/11/2004  Job:  [id number]

## 2010-09-08 NOTE — Discharge Summary (Signed)
NAME:  [name], [name]             ACCOUNT NO.:  [id number]   MEDICAL RECORD NO.:  [id number]          PATIENT TYPE:  INP   LOCATION:  6532                         FACILITY:  [facility]   PHYSICIAN:  Arturo Morton. Riley Kill, MD, FACCDATE OF BIRTH:  11/17/1960   DATE OF ADMISSION:  12/15/2005  DATE OF DISCHARGE:  12/19/2005                                 DISCHARGE SUMMARY   PROCEDURES:  1. Cardiac catheterization.  2. Coronary arteriogram.  3. Left ventriculogram.  4. PTCA and stent to the circumflex marginal.   TIME AT DISCHARGE:  38 minutes.   PRIMARY DIAGNOSIS:  Non-ST segment elevation myocardial infarction.   SECONDARY DIAGNOSES:  1. History of ST elevation myocardial infarction in 2005 with a total      right coronary artery with balloon angioplasty to the first diagonal.  2. Type 2 diabetes.  3. Hypertension.  4. Medical noncompliance.  5. Hyperlipidemia.  6. Ongoing tobacco abuse.  7. Arthritis of the lumbar spine.   HOSPITAL COURSE:  [name] is a 50 year old male with known coronary  artery disease.  He was cathed in 2005, and had angioplasty to the first  diagonal.  He quit taking all of his medications and, when he was on  vacation, had some chest pain, at which time he went to a local of physician  and was told that he had a small heart attack.  He was put back on some  medications and discharged home.  He had repeat chest pain and came to [facility] where he ruled in for an MI and was stabilized medically.  He  was taken to the cath lab on December 17, 2005.   The cardiac catheterization showed a 99% diagonal with TIMI II flow.  There  was a 99% distal RCA.  There was a 90% circumflex marginal which was treated  with PTCA and a bare-metal stent, reducing the stenosis to zero.  Residual  nonobstructive disease is to be treated medically.  His EF was 35-40% with  anterior and inferior hypokinesis.   A hemoglobin A1c was done which was elevated at 8.4, but  this is lower than  it was in 2005, when it was greater than 10.  He had been started on  Glucophage and is encouraged to continue this and follow up with his primary  care physician.  BNP was 228.  A urine drug screen was positive for opiates  and benzodiazepines only.  A lipid profile showed a total cholesterol of  146, triglycerides 285, HDL 29, LDL 60.  He is on Crestor and is to continue  this.  TSH was within normal limits as well.   [name] was seen by smoking cessation and cardiac rehab.  He plans to  quit smoking and will follow up with cardiac rehab in [place].  He was seen  by a nutritionist as well.   By December 19, 2005, [name] was ambulating without chest pain or  shortness of breath.  He was evaluated by Dr. Riley Kill, and considered stable  for discharge with outpatient follow-up arranged.   DISCHARGE INSTRUCTIONS:  1. His activity level is to be increased gradually.  2. He is not to drive for 2 weeks and no lifting for 3 weeks.  3. He is to call our office for problems with the cath site.  4. He is to get an echocardiogram on Wednesday, December 26, 2005, at 7:30      a.m., and follow up with Dr. Jens Som on January 02, 2006, at 12:15.  5. He is to call Dr. Venetia Maxon as needed.   DISCHARGE MEDICATIONS:  1. Atenolol 25 mg a day.  2. Atenolol 25 mg a day is stopped.  3. Crestor 10 mg a day.  4. Lisinopril 10 mg as q. day.  5. Plavix 75 mg q. day.  6. Metformin 500 mg b.i.d., restart December 20, 2005.  7. Aspirin 325 mg q. day.  8. Lisinopril 20 mg q.d. has been discontinued.     ______________________________  [name], PA-C      Arturo Morton Riley Kill, MD, [facility]  Electronically Signed    RB/MEDQ  D:  12/19/2005  T:  12/20/2005  Job:  [id number]   cc:   Danae Orleans. Venetia Maxon, M.D.

## 2010-09-14 ENCOUNTER — Other Ambulatory Visit (HOSPITAL_COMMUNITY): Payer: Self-pay | Admitting: Family Medicine

## 2010-09-14 DIAGNOSIS — M549 Dorsalgia, unspecified: Secondary | ICD-10-CM

## 2010-09-19 ENCOUNTER — Inpatient Hospital Stay (HOSPITAL_COMMUNITY): Admission: RE | Admit: 2010-09-19 | Payer: Self-pay | Source: Ambulatory Visit

## 2011-01-29 ENCOUNTER — Inpatient Hospital Stay (HOSPITAL_COMMUNITY)
Admission: EM | Admit: 2011-01-29 | Discharge: 2011-01-31 | DRG: 378 | Disposition: A | Discharge status: Home / Self Care | Payer: Medicaid Other | Attending: Internal Medicine | Admitting: Internal Medicine

## 2011-01-29 ENCOUNTER — Emergency Department (HOSPITAL_COMMUNITY): Payer: Medicaid Other

## 2011-01-29 ENCOUNTER — Inpatient Hospital Stay (HOSPITAL_COMMUNITY): Payer: Medicaid Other

## 2011-01-29 DIAGNOSIS — Z7982 Long term (current) use of aspirin: Secondary | ICD-10-CM

## 2011-01-29 DIAGNOSIS — I252 Old myocardial infarction: Secondary | ICD-10-CM

## 2011-01-29 DIAGNOSIS — Z9861 Coronary angioplasty status: Secondary | ICD-10-CM

## 2011-01-29 DIAGNOSIS — D62 Acute posthemorrhagic anemia: Secondary | ICD-10-CM | POA: Diagnosis not present

## 2011-01-29 DIAGNOSIS — D126 Benign neoplasm of colon, unspecified: Secondary | ICD-10-CM | POA: Diagnosis present

## 2011-01-29 DIAGNOSIS — I2510 Atherosclerotic heart disease of native coronary artery without angina pectoris: Secondary | ICD-10-CM | POA: Diagnosis present

## 2011-01-29 DIAGNOSIS — K922 Gastrointestinal hemorrhage, unspecified: Principal | ICD-10-CM | POA: Diagnosis present

## 2011-01-29 DIAGNOSIS — K2980 Duodenitis without bleeding: Secondary | ICD-10-CM | POA: Diagnosis present

## 2011-01-29 DIAGNOSIS — M1990 Unspecified osteoarthritis, unspecified site: Secondary | ICD-10-CM | POA: Diagnosis present

## 2011-01-29 DIAGNOSIS — K5730 Diverticulosis of large intestine without perforation or abscess without bleeding: Secondary | ICD-10-CM | POA: Diagnosis present

## 2011-01-29 DIAGNOSIS — D72829 Elevated white blood cell count, unspecified: Secondary | ICD-10-CM | POA: Diagnosis present

## 2011-01-29 HISTORY — DX: Essential (primary) hypertension: I10

## 2011-01-29 LAB — CBC
HCT: 37.1 % — ABNORMAL LOW (ref 39.0–52.0)
Hemoglobin: 13.2 g/dL (ref 13.0–17.0)
MCH: 30.6 pg (ref 26.0–34.0)
MCHC: 35.6 g/dL (ref 30.0–36.0)
MCV: 86.1 fL (ref 78.0–100.0)
Platelets: 279 10*3/uL (ref 150–400)
RBC: 4.31 MIL/uL (ref 4.22–5.81)
RDW: 13.1 % (ref 11.5–15.5)
WBC: 13.7 10*3/uL — ABNORMAL HIGH (ref 4.0–10.5)

## 2011-01-29 LAB — COMPREHENSIVE METABOLIC PANEL
ALT: 24 U/L (ref 0–53)
Albumin: 3.3 g/dL — ABNORMAL LOW (ref 3.5–5.2)
BUN: 15 mg/dL (ref 6–23)
CO2: 24 mEq/L (ref 19–32)
Chloride: 104 mEq/L (ref 96–112)
Creatinine, Ser: 0.73 mg/dL (ref 0.50–1.35)
GFR calc Af Amer: >90 mL/min (ref >90)
GFR calc non Af Amer: >90 mL/min (ref >90)
Glucose, Bld: 216 mg/dL — ABNORMAL HIGH (ref 70–99)
Sodium: 138 mEq/L (ref 135–145)
Total Bilirubin: 0.2 mg/dL — ABNORMAL LOW (ref 0.3–1.2)

## 2011-01-29 LAB — DIFFERENTIAL
Basophils Absolute: 0.1 10*3/uL (ref 0.0–0.1)
Basophils Relative: 1 % (ref 0–1)
Eosinophils Absolute: 0.1 10*3/uL (ref 0.0–0.7)
Lymphocytes Relative: 25 % (ref 12–46)
Lymphs Abs: 3.3 10*3/uL (ref 0.7–4.0)
Monocytes Absolute: 0.8 10*3/uL (ref 0.1–1.0)
Monocytes Relative: 6 % (ref 3–12)
Neutro Abs: 9.4 10*3/uL — ABNORMAL HIGH (ref 1.7–7.7)
Neutrophils Relative %: 69 % (ref 43–77)

## 2011-01-29 LAB — GLUCOSE, CAPILLARY: Glucose-Capillary: 176 mg/dL — ABNORMAL HIGH (ref 70–99)

## 2011-01-29 LAB — PROTIME-INR
INR: 1.08 (ref 0.00–1.49)
Prothrombin Time: 14.2 seconds (ref 11.6–15.2)

## 2011-01-29 LAB — MAGNESIUM: Magnesium: 1.8 mg/dL (ref 1.5–2.5)

## 2011-01-29 LAB — CK TOTAL AND CKMB (NOT AT ARMC)
CK, MB: 2.4 ng/mL (ref 0.3–4.0)
Relative Index: INVALID (ref 0.0–2.5)
Total CK: 46 U/L (ref 7–232)

## 2011-01-29 LAB — LIPASE, BLOOD: Lipase: 19 U/L (ref 11–59)

## 2011-01-29 LAB — PHOSPHORUS: Phosphorus: 3.4 mg/dL (ref 2.3–4.6)

## 2011-01-29 LAB — APTT: aPTT: 42 seconds — ABNORMAL HIGH (ref 24–37)

## 2011-01-29 LAB — TROPONIN I: Troponin I: <0.3 ng/mL (ref <0.30)

## 2011-01-29 LAB — ABO/RH: ABO/RH(D): A POS

## 2011-01-30 ENCOUNTER — Encounter (HOSPITAL_COMMUNITY): Payer: Self-pay | Admitting: Radiology

## 2011-01-30 LAB — CARDIAC PANEL(CRET KIN+CKTOT+MB+TROPI)
CK, MB: 2.5 ng/mL (ref 0.3–4.0)
CK, MB: 2.4 ng/mL (ref 0.3–4.0)
Relative Index: INVALID (ref 0.0–2.5)
Relative Index: INVALID (ref 0.0–2.5)
Total CK: 44 U/L (ref 7–232)
Total CK: 46 U/L (ref 7–232)
Troponin I: <0.3 ng/mL (ref <0.30)

## 2011-01-30 LAB — BASIC METABOLIC PANEL
Calcium: 8.5 mg/dL (ref 8.4–10.5)
Creatinine, Ser: 0.65 mg/dL (ref 0.50–1.35)
GFR calc Af Amer: >90 mL/min (ref >90)
GFR calc non Af Amer: >90 mL/min (ref >90)
Sodium: 139 mEq/L (ref 135–145)

## 2011-01-30 LAB — CBC
HCT: 28.2 % — ABNORMAL LOW (ref 39.0–52.0)
HCT: 31.9 % — ABNORMAL LOW (ref 39.0–52.0)
Hemoglobin: 10.1 g/dL — ABNORMAL LOW (ref 13.0–17.0)
MCH: 30.5 pg (ref 26.0–34.0)
MCH: 30.2 pg (ref 26.0–34.0)
MCHC: 35.8 g/dL (ref 30.0–36.0)
MCHC: 34.8 g/dL (ref 30.0–36.0)
MCV: 85.2 fL (ref 78.0–100.0)
MCV: 86.7 fL (ref 78.0–100.0)
Platelets: 224 10*3/uL (ref 150–400)
Platelets: 253 10*3/uL (ref 150–400)
RBC: 3.31 MIL/uL — ABNORMAL LOW (ref 4.22–5.81)
RBC: 3.68 MIL/uL — ABNORMAL LOW (ref 4.22–5.81)
RDW: 13.2 % (ref 11.5–15.5)
RDW: 13.3 % (ref 11.5–15.5)
WBC: 11.1 10*3/uL — ABNORMAL HIGH (ref 4.0–10.5)

## 2011-01-30 LAB — URINALYSIS, ROUTINE W REFLEX MICROSCOPIC
Bilirubin Urine: NEGATIVE
Glucose, UA: NEGATIVE mg/dL
Hgb urine dipstick: NEGATIVE
Ketones, ur: NEGATIVE mg/dL
Leukocytes, UA: NEGATIVE
Nitrite: NEGATIVE
Protein, ur: NEGATIVE mg/dL
Specific Gravity, Urine: 1.022 (ref 1.005–1.030)
Urobilinogen, UA: 0.2 mg/dL (ref 0.0–1.0)
pH: 5 (ref 5.0–8.0)

## 2011-01-30 LAB — GLUCOSE, CAPILLARY
Glucose-Capillary: 142 mg/dL — ABNORMAL HIGH (ref 70–99)
Glucose-Capillary: 134 mg/dL — ABNORMAL HIGH (ref 70–99)

## 2011-01-30 MED ORDER — IOHEXOL 300 MG/ML  SOLN
100.0000 mL | Freq: Once | INTRAMUSCULAR | Status: AC | PRN
  Administered 2011-01-30: 100 mL via INTRAVENOUS

## 2011-01-31 ENCOUNTER — Other Ambulatory Visit: Payer: Self-pay | Admitting: Gastroenterology

## 2011-01-31 LAB — URINE CULTURE
Colony Count: NO GROWTH
Culture  Setup Time: 201210091528

## 2011-01-31 LAB — GLUCOSE, CAPILLARY: Glucose-Capillary: 132 mg/dL — ABNORMAL HIGH (ref 70–99)

## 2011-01-31 LAB — CBC
HCT: 27.7 % — ABNORMAL LOW (ref 39.0–52.0)
Hemoglobin: 9.6 g/dL — ABNORMAL LOW (ref 13.0–17.0)
MCH: 29.9 pg (ref 26.0–34.0)
MCHC: 34.7 g/dL (ref 30.0–36.0)
RBC: 3.21 MIL/uL — ABNORMAL LOW (ref 4.22–5.81)
WBC: 7.7 10*3/uL (ref 4.0–10.5)

## 2011-01-31 LAB — OCCULT BLOOD X 1 CARD TO LAB, STOOL: Fecal Occult Bld: NEGATIVE

## 2011-02-01 LAB — CROSSMATCH
ABO/RH(D): A POS
Antibody Screen: NEGATIVE
Unit division: 0

## 2011-02-01 NOTE — Discharge Summary (Signed)
[name], [name]             ACCOUNT NO.:  [id number]  MEDICAL RECORD NO.:  [id number]  LOCATION:  4731                         FACILITY:  [facility]  PHYSICIAN:  Isidor Holts, M.D.  DATE OF BIRTH:  10/27/1960  DATE OF ADMISSION:  01/29/2011 DATE OF DISCHARGE:  01/31/2011                              DISCHARGE SUMMARY   PRIMARY CARE PHYSICIAN:  Dr. Larina Bras, [place], [place].  PRIMARY CARDIOLOGIST:  Madolyn Frieze. Jens Som, MD, [facility]  DISCHARGE DIAGNOSES: 1. Lower gastrointestinal bleed, secondary to colonic polyps. 2. Status post colonoscopy/polypectomy x2 on January 31, 2011 by Dr.     Randa Evens, gastroenterologist. 3. Diverticulosis. 4. Duodenitis on esophagogastroduodenoscopy, January 30, 2011 by Dr.     Randa Evens. 5. Coronary artery disease, status post myocardial infarction in     2005/percutaneous transluminal coronary angioplasty to right     coronary artery. 6. Status post non-st elevation myocardial infarction in August     2007/bare-metal stent to the obtuse marginal. 7. Osteoarthritis.  DISCHARGE MEDICATIONS: 1. Enteric-coated aspirin 325 mg p.o. b.i.d. Patient has been instructed to hold     this for 5 days.  May be restarted on February 06, 2011.  PROCEDURES: 1. Abdominal/pelvic CT scan on January 30, 2011.  This showed chronic     diverticulosis without evidence for diverticulitis or colitis     nonspecific mild perinephric fat stranding, no hydronephrosis.     Suggestion of hepatic steatosis, there was also indeterminate left     adrenal nodule, favored to represent an adenoma. 2. Esophagogastroduodenoscopy on January 30, 2011.  This showed     duodenitis, no source of bleeding found on upper endoscopy. 3. Colonoscopy done on January 31, 2011, this demonstrated     divierticulosis, also sessile polyp in the descending colon and     sigmoid colon, probably source of bleeding.  These were     successfully snared and removed.  CONSULTATIONS: 1. James L.  Randa Evens, MD, gastroenterologist.  ADMISSION HISTORY: 1. As in H and P notes of January 29, 2011, dictated by Dr. Richarda Overlie.  However, in brief, this is a 50 year old male, with known     history of coronary artery disease, status post STEMI in 2005,     treated with PTCA to RCA, NSTEMI in 2007 and underwent cardiac     catheterization, which demonstrated 40% LAD, diagonal subtotal     circumflex 30%, and obtuse marginal 2 of 99%, reduced to 0 with     bare-metal stent, osteoarthritis, presenting with several episodes     of bright red blood per rectum.  He was admitted for further     evaluation, investigation, and management.  CLINICAL COURSE: 1. Lower GI bleed.  The patient presented as described above.  There     was some confusion as to whether he had melena and then     hematochezia, or just hematochezia. Be that as it may, GI     consultation was kindly provided by Dr. Carman Ching.  The patient     was placed on proton pump inhibitor, intravenous fluid hydration.     Serial CBCs were done.  He had no further episodes of  GI bleed     during the course of his hospitalization and as a matter of fact,     fecal occult blood testing was negative on January 31, 2011.  He     underwent upper GI endoscopy on January 30, 2011 which demonstrated     duodenitis, but no other source of bleeding.  Lower GI endoscopy     was carried out on January 31, 2011, demonstrated 2 sessile polyps,     one in the descending colon, other one in the sigmoid, which were     then snared in an uncomplicated procedure by a gastroenterologist.     The patient's aspirin is to remain on hold for the next 5 days.  2. Anemia.  The patient at presentation, had a hemoglobin of 13.2.     However, with intravenous fluid hydration as well as his recent     history of GI bleed, his hemoglobin dropped down to 9.6 by January 31, 2011. As described above, he had no further overt     episodes of bleeding.   Likely, he has a mild acute blood loss     anemia; but hemoglobin as of January 31, 2011 was considered     reasonable.  3. History of coronary artery disease.  The patient was asymptomatic     from this viewpoint, throughout the course of this hospitalization.     As described above, his aspirin is on hold until February 06, 2011.  DISPOSITION: 1. The patient on January 31, 2011 was considered clinically stable     for discharge.  There were no new issues.  He did have a small bump     in blood pressure to 180/90 mmHg in the a.m. of January 31, 2011,     after colonoscopy.  This was the only high blood pressure recorded     during the course of this hospitalization.  The patient asssures me     that althouigh he he used to be hypertensive in the past, he is no      longer on blood pressure medications, after having lost a considerable amount     of weight, and his blood pressure has since normalized.  He has     been recommended heart-healthy diet and is to follow up with his primary     MD.  The patient was discharged on January 31, 2011.  ACTIVITY:  As tolerated.  DIET:  Heart healthy.  FOLLOWUP INSTRUCTIONS:  The patient will follow up routinely with his primary MD, Dr. Larina Bras.  He will also follow up routinely with his primary cardiologist Dr. Olga Millers.  GI followup will be arranged by Dr. Carman Ching on a date to be determined and Dr. Randa Evens will communicate results of patient's pathology findings to the patient, as well as timing of repeat endoscopic evaluation.  All this has been communicated to the patient, he has verbalized understanding.     Isidor Holts, M.D.     CO/MEDQ  D:  01/31/2011  T:  01/31/2011  Job:  [id number]  cc:   Dr. Burna Sis. Jens Som, MD, [facility] Aliment. Malon Kindle., M.D.  Electronically Signed by Isidor Holts M.D. on 02/01/2011 05:45:40 PM

## 2011-02-02 LAB — LIPID PANEL
HDL: 22 — ABNORMAL LOW
LDL Cholesterol: UNDETERMINED
Total CHOL/HDL Ratio: 12.6
Triglycerides: 1054 — ABNORMAL HIGH
VLDL: UNDETERMINED

## 2011-02-02 LAB — CARDIAC PANEL(CRET KIN+CKTOT+MB+TROPI)
CK, MB: 1.6
CK, MB: 1.7
Relative Index: INVALID
Relative Index: INVALID
Total CK: 54
Total CK: 58

## 2011-02-02 LAB — I-STAT 8, (EC8 V) (CONVERTED LAB)
Acid-base deficit: 1
BUN: 17
Chloride: 100
Glucose, Bld: 571
Hemoglobin: 17.3 — ABNORMAL HIGH
Potassium: 4
pCO2, Ven: 34.4 — ABNORMAL LOW
pH, Ven: 7.423 — ABNORMAL HIGH

## 2011-02-02 LAB — BASIC METABOLIC PANEL
BUN: 12
Calcium: 8.8
Chloride: 106
GFR calc non Af Amer: >60
Glucose, Bld: 247 — ABNORMAL HIGH
Potassium: 4
Sodium: 136

## 2011-02-02 LAB — COMPREHENSIVE METABOLIC PANEL
Albumin: 3.6
Alkaline Phosphatase: 65
BUN: 15
Calcium: 9.1
Chloride: 103
Creatinine, Ser: 0.75
GFR calc non Af Amer: >60
Glucose, Bld: 234 — ABNORMAL HIGH
Potassium: 3.7
Total Bilirubin: 0.4

## 2011-02-02 LAB — CK TOTAL AND CKMB (NOT AT ARMC): Relative Index: INVALID

## 2011-02-02 LAB — DIFFERENTIAL
Basophils Absolute: 0.1
Basophils Relative: 1
Eosinophils Absolute: 0.2
Eosinophils Relative: 2
Lymphs Abs: 3.7 — ABNORMAL HIGH
Neutro Abs: 5.8
Neutrophils Relative %: 56

## 2011-02-02 LAB — D-DIMER, QUANTITATIVE: D-Dimer, Quant: <0.22

## 2011-02-02 LAB — CBC
HCT: 41.9
HCT: 44.3
Hemoglobin: 14.7
Hemoglobin: 15.6
MCV: 88
Platelets: 265
Platelets: 315
RBC: 4.78
RDW: 12.7
RDW: 12.8
RDW: 13
WBC: 10.3
WBC: 9
WBC: 9.8

## 2011-02-02 LAB — MAGNESIUM: Magnesium: 2.2

## 2011-02-02 LAB — HEMOGLOBIN A1C
Hgb A1c MFr Bld: 11.1 — ABNORMAL HIGH
Mean Plasma Glucose: 318

## 2011-02-02 LAB — POCT CARDIAC MARKERS
CKMB, poc: <1 — ABNORMAL LOW
Myoglobin, poc: 42.7
Operator id: 146091
Operator id: 288331
Troponin i, poc: <0.05

## 2011-02-02 LAB — POCT I-STAT CREATININE: Operator id: 288331

## 2011-02-02 LAB — TROPONIN I: Troponin I: 0.03

## 2011-02-02 LAB — URINALYSIS, ROUTINE W REFLEX MICROSCOPIC
Bilirubin Urine: NEGATIVE
Ketones, ur: NEGATIVE
Leukocytes, UA: NEGATIVE
Nitrite: NEGATIVE
Protein, ur: NEGATIVE
Urobilinogen, UA: 0.2
pH: 5

## 2011-02-02 LAB — APTT: aPTT: 28

## 2011-02-08 NOTE — Op Note (Signed)
  NAMEGRANTLAND, [name]             ACCOUNT NO.:  [id number]  MEDICAL RECORD NO.:  [id number]  LOCATION:  4731                         FACILITY:  [facility]  PHYSICIAN:  Jeovanny Cuadros L. Malon Kindle., M.D.DATE OF BIRTH:  [birth year]  DATE OF PROCEDURE:  01/30/2011 DATE OF DISCHARGE:                              OPERATIVE REPORT   PROCEDURE:  Esophagogastroduodenoscopy.  MEDICATIONS: 1. Cetacaine spray. 2. Benadryl 25 mg IV. 3. Fentanyl 100 mg. 4. Versed 7 mg IV.  INDICATION:  Acute GI bleeding.  DESCRIPTION OF PROCEDURE:  Procedure explained to the patient and consent obtained.  Left lateral decubitus position, the Pentax upper scope was inserted into the esophagus with agglutination.  The esophagus was completely normal.  There were no varices, ulcerations, or any abnormalities.  The stomach was entered and the stomach was normal with no ulceration, inflammation, or signs of recent or active bleeding.  The pylorus was passed.  The duodenum did reveal some mild duodenitis without any active bleeding.  There are no ulcerations or erosions.  The scope was withdrawn.  The patient tolerated the procedure well.  ASSESSMENT: 1. Duodenitis.  No source of bleeding found on upper endoscopy. 2. Gastrointestinal bleeding, probably lower gastrointestinal.  PLAN:  We will proceed with colonoscopy tomorrow and treat empirically with Pepcid for his duodenitis.          ______________________________ Llana Aliment. Malon Kindle., M.D.     Waldron Session  D:  01/30/2011  T:  01/30/2011  Job:  [id number]  Electronically Signed by Carman Ching M.D. on 02/08/2011 12:26:17 PM

## 2011-02-08 NOTE — Consult Note (Signed)
  [name], [name]             ACCOUNT NO.:  [id number]  MEDICAL RECORD NO.:  [id number]  LOCATION:  4731                         FACILITY:  [facility]  PHYSICIAN:  Tayia Stonesifer L. Malon Kindle., M.D.DATE OF BIRTH:  November 29, 1960  DATE OF CONSULTATION:  01/30/2011 DATE OF DISCHARGE:                                CONSULTATION   REASON FOR CONSULTATION:  GI bleeding.  PRIMARY CARE PHYSICIAN:  [place] cardiologist Dr. Olga Millers.  HISTORY:  The patient is a 50 year old white male who has never had previous GI bleeding.  He has never had an endoscopy or colonoscopy.  He had been doing well until yesterday when he presented to the emergency room with melena that became bright red.  He had 8 or 9 episodes, was lightheaded, but had no chest pain and did not pass out, although he felt somewhat weak and dizzy.  The patient was admitted to the hospital. His hemoglobin was 13 this dropped to 11.1 and briefly was 10.4.  BUN 15, creatinine 0.7.  His blood sugar was 216 on admission.  Due to his history of coronary artery disease, cardiac enzymes were obtained which were negative.  The patient has not had any further bleeding since yesterday evening.  He has minimal epigastric tenderness.  Takes no aspirins, Goody's, BCs or other NSAIDs, other than a single aspirin for cardiac reasons.  He has no abdominal pain.  There is no family history of gastric or colon cancer.  CURRENT MEDICATIONS:  Aspirin 1 daily.  No other medicines.  ALLERGIES:  He has no drug allergies.  MEDICAL HISTORY:  He had history of an MI in 2005 with PTCA and stenting to the right coronary artery.  He does have a bare-metal stent.  He has some arthritis.  No other surgeries.  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  PHYSICAL EXAM:  VITAL SIGNS:  Temperature 98.5, respirations 20, blood pressure 91/47. GENERAL:  Alert white male in no acute distress. HEENT:  Sclerae  nonicteric. NECK:  Supple.  No lymphadenopathy.  LUNGS:  Clear. HEART:  Regular rate and rhythm without murmurs or gallops. ABDOMEN:  Soft and nontender.  ASSESSMENT AND PLAN:  Gastrointestinal bleed, probably lower gastrointestinal suspect due to diverticulosis.  He reports that he may have had diverticulitis in the past.  I think we need to rule out an upper source initially.  Plan to proceed with EGD today and if negative we will recommend colonoscopy.          ______________________________ Llana Aliment. Malon Kindle., M.D.     Waldron Session  D:  01/30/2011  T:  01/30/2011  Job:  [id number]  Electronically Signed by Carman Ching M.D. on 02/08/2011 12:26:05 PM

## 2011-02-21 NOTE — H&P (Signed)
[name], [name]             ACCOUNT NO.:  [id number]  MEDICAL RECORD NO.:  [id number]  LOCATION:  [facility]                         FACILITY:  [facility]  PHYSICIAN:  Richarda Overlie, MD       DATE OF BIRTH:  1960-10-10  DATE OF ADMISSION:  01/29/2011 DATE OF DISCHARGE:                             HISTORY & PHYSICAL   PRIMARY CARE PHYSICIAN:  Dr. Larina Bras in [place], [place].  PRIMARY CARDIOLOGIST:  Madolyn Frieze. Jens Som, MD, [facility]  CHIEF COMPLAINT:  GI bleeding.  SUBJECTIVE:  A 50 year old male with a history of coronary artery disease who presents to the ER with a chief complaint of melena that started around 1 o'clock today.  The patient has had 8 or 9 episodes of melena today.  He has some associated lightheadedness, but denies any chest pain or shortness of breath.  The patient states that the episodes were consisted of bright red blood per rectum.  He does have some epigastric tenderness as well as tenderness around the peri-umbical area; however, he denies any nausea, vomiting.  He has not had any hematemesis.  He denies any chest pain per se.  PAST MEDICAL HISTORY: 1. History of ST-elevation MI in 2005 with PTCA to the right coronary     artery. 2. History of non-ST elevation MI in August 2007 with cardiac     catheterization showing 40% left anterior descending.  Diagonal     subtotal.  Obtuse marginal.  Circumflex 30% and obtuse marginal 2     of 99% reduced to 0 with bare metal stent. 3. Osteoarthritis. 4. Noncompliance with medications. 5. Cardiac catheterization.  ALLERGIES:  No known drug allergies.  CURRENT MEDICATIONS:  Aspirin, occasional Aleve.  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  He has greater than 50-pack- year history of smoking.  FAMILY HISTORY:  Mother is alive in her 79s with a history of coronary artery disease.  Father is alive without any history of coronary  artery disease.  REVIEW OF SYSTEMS:  Complete 14-point review of systems was done, as documented in the HPI.  PHYSICAL EXAMINATION:  VITAL SIGNS:  Blood pressure 136/65, pulse of 114, respirations 18, temperature 97.6. GENERAL:  Well nourished, currently comfortable in no acute cardiopulmonary distress. HEENT:  Pupils equal and reactive.  Extraocular movements are intact. NECK:  Supple.  No JVD. LUNGS:  Clear to auscultation bilaterally.  No wheezes, crackles, or rhonchi. CARDIOVASCULAR:  Regular rate and rhythm.  No murmurs, rubs or gallops. ABDOMEN:  Obese, soft, nontender, nondistended. EXTREMITIES:  Without cyanosis, clubbing or edema. NEUROLOGIC:  Cranial nerves II through X11 grossly intact.  LABORATORY DATA:  PTT of 42, lipase of 19.  CMP; sodium 138, potassium 4.4, chloride 104, bicarb 24, glucose 216, BUN 15, creatinine 0.77, alk phos 75, AST 15, ALT 24, calcium 9.6, INR of 1.08.  CBC; WBC 13.7, hemoglobin 13.2, hematocrit 37.1 and a platelet count of 279.  ASSESSMENT: 1. Melena, likely secondary to upper gastrointestinal bleeding. 2. Epigastric pain, could be secondary to underlying peptic ulcer     disease/gastritis. 3. History of coronary artery disease.  PLAN:  The patient will be admitted for evaluation of his melena as well as transfusion if the need arises.  We will do serial CBC.  If his hemoglobin drops below 10 or if he starts developing active chest pain or if the patient has recurrent rectal bleeding, the patient will be transfused with 2 units of packed red blood cells.  We will also do a CT scan of the abdomen and pelvis to rule out any underlying perforation, although his abdominal exam appears to be benign.  He has been started on a Protonix drip.  We will avoid aspirin, NSAIDs.  We will use SCDs for DVT prophylaxis.  Dr. Charlott Rakes has been notified and he will see the patient tonight.  The patient will be kept n.p.o. for possible scope.  He is  a full code.     Richarda Overlie, MD     NA/MEDQ  D:  01/29/2011  T:  01/29/2011  Job:  [id number]  Electronically Signed by Richarda Overlie MD on 02/21/2011 11:47:29 PM

## 2011-05-16 ENCOUNTER — Ambulatory Visit (HOSPITAL_COMMUNITY)
Admission: RE | Admit: 2011-05-16 | Discharge: 2011-05-16 | Disposition: A | Discharge status: Home / Self Care | Payer: Medicaid Other | Source: Ambulatory Visit | Attending: Family Medicine | Admitting: Family Medicine

## 2011-05-16 ENCOUNTER — Other Ambulatory Visit (HOSPITAL_COMMUNITY): Payer: Self-pay | Admitting: Family Medicine

## 2011-05-16 DIAGNOSIS — M546 Pain in thoracic spine: Secondary | ICD-10-CM | POA: Insufficient documentation

## 2011-05-16 DIAGNOSIS — R52 Pain, unspecified: Secondary | ICD-10-CM

## 2011-06-13 ENCOUNTER — Other Ambulatory Visit (HOSPITAL_COMMUNITY): Payer: Self-pay | Admitting: Family Medicine

## 2011-06-13 DIAGNOSIS — M546 Pain in thoracic spine: Secondary | ICD-10-CM

## 2011-06-18 ENCOUNTER — Ambulatory Visit (HOSPITAL_COMMUNITY)
Admission: RE | Admit: 2011-06-18 | Discharge: 2011-06-18 | Disposition: A | Discharge status: Home / Self Care | Payer: Medicaid Other | Source: Ambulatory Visit | Attending: Family Medicine | Admitting: Family Medicine

## 2011-06-18 DIAGNOSIS — M546 Pain in thoracic spine: Secondary | ICD-10-CM | POA: Insufficient documentation

## 2011-07-11 ENCOUNTER — Encounter: Payer: Self-pay | Admitting: Cardiology

## 2011-08-28 ENCOUNTER — Encounter: Payer: Self-pay | Admitting: *Deleted

## 2011-08-29 ENCOUNTER — Encounter: Payer: Self-pay | Admitting: Cardiology

## 2011-08-29 ENCOUNTER — Ambulatory Visit (INDEPENDENT_AMBULATORY_CARE_PROVIDER_SITE_OTHER): Payer: Medicaid Other | Admitting: Cardiology

## 2011-08-29 VITALS — BP 162/98 | HR 65 | Ht 69.0 in | Wt 190.0 lb

## 2011-08-29 DIAGNOSIS — I10 Essential (primary) hypertension: Secondary | ICD-10-CM

## 2011-08-29 DIAGNOSIS — Z720 Tobacco use: Secondary | ICD-10-CM

## 2011-08-29 DIAGNOSIS — E119 Type 2 diabetes mellitus without complications: Secondary | ICD-10-CM

## 2011-08-29 DIAGNOSIS — E785 Hyperlipidemia, unspecified: Secondary | ICD-10-CM

## 2011-08-29 DIAGNOSIS — I2510 Atherosclerotic heart disease of native coronary artery without angina pectoris: Secondary | ICD-10-CM

## 2011-08-29 DIAGNOSIS — F17200 Nicotine dependence, unspecified, uncomplicated: Secondary | ICD-10-CM

## 2011-08-29 MED ORDER — METOPROLOL SUCCINATE ER 25 MG PO TB24: 25.0000 mg | ORAL_TABLET | Freq: Every day | ORAL | Status: DC

## 2011-08-29 MED ORDER — SIMVASTATIN 40 MG PO TABS: 40.0000 mg | ORAL_TABLET | Freq: Every day | ORAL | Status: DC

## 2011-08-29 NOTE — Assessment & Plan Note (Signed)
I have asked him to find a primary care physician to follow his diabetes mellitus and other medical problems. I have instructed him on the importance of compliance. He has not been here in over 3 years. He is not happy with his present primary care.

## 2011-08-29 NOTE — Progress Notes (Signed)
  HPI: Pleasant gentleman for evaluation of coronary artery disease. He does have a history of PCI. His most recent cardiac catheterization in September of 2008 showed a 20% left main. There was a 50% LAD at the takeoff of the first diagonal. There was a 70% mid LAD. The first diagonal had a 90% tubular lesion. There was a 30-40% circumflex. The right coronary was subtotally occluded. There were left to right collaterals and the EF was 60. He has had problems with noncompliance with FU. He has not been seen since Jan 2010. Patient states he feels some dyspnea towards the end of the day. There is no orthopnea or PND. He describes occasional pedal edema. He has had problems with chronic back pain radiating from his chest. This increases with certain movements. He does not have exertional chest pain. No syncope.  Current Outpatient Prescriptions  Medication Sig Dispense Refill  . aspirin 325 MG tablet Take 325 mg by mouth daily.      . metFORMIN (GLUCOPHAGE) 1000 MG tablet Take 1,000 mg by mouth 2 (two) times daily with a meal.        Allergies  Allergen Reactions  . Lisinopril     Past Medical History  Diagnosis Date  . Hypertension   . Diabetes mellitus   . Hyperlipidemia   . CAD (coronary artery disease)   . Anxiety   . Colon polyps   . Chronic back pain     Past Surgical History  Procedure Date  . Coronary angioplasty with stent placement     History   Social History  . Marital Status: Legally Separated    Spouse Name: N/A    Number of Children: N/A  . Years of Education: N/A   Occupational History  .      Unemployed   Social History Main Topics  . Smoking status: Current Everyday Smoker  . Smokeless tobacco: Not on file  . Alcohol Use: No  . Drug Use: Not on file  . Sexually Active: Not on file   Other Topics Concern  . Not on file   Social History Narrative  . No narrative on file    Family History  Problem Relation Age of Onset  . Diabetes Father   .  Kidney failure Father     ROS: Anxiety, chronic back and leg pain but no fevers or chills, productive cough, hemoptysis, dysphasia, odynophagia, melena, hematochezia, dysuria, hematuria, rash, seizure activity, orthopnea, PND,  claudication. Remaining systems are negative.  Physical Exam:   Blood pressure 162/98, pulse 65, height 5\' 9"  (1.753 m), weight 86.183 kg (190 lb).  General:  Well developed/well nourished in NAD Skin warm/dry Patient not depressed but extremely anxious No peripheral clubbing Back-normal HEENT-normal/normal eyelids Neck supple/normal carotid upstroke bilaterally; no bruits; no JVD; no thyromegaly chest - CTA/ normal expansion CV - RRR/normal S1 and S2; no murmurs, rubs or gallops;  PMI nondisplaced Abdomen -NT/ND, no HSM, no mass, + bowel sounds, no bruit 2+ femoral pulses, no bruits Ext-no edema, chords, 2+ DP Neuro-grossly nonfocal  ECG sinus rhythm at a rate of 65. Left anterior fascicular block. Anterior infarct. Lateral T-wave inversion.

## 2011-08-29 NOTE — Assessment & Plan Note (Signed)
Resume Zocor 40 mg daily. Check lipids and liver in 6 weeks. 

## 2011-08-29 NOTE — Assessment & Plan Note (Signed)
Patient counseled on discontinuing. 

## 2011-08-29 NOTE — Assessment & Plan Note (Signed)
Blood pressure elevated. Begin Toprol 25 mg daily. Increase as needed. He may need an ARB if his LV function is reduced on last functional study. He apparently had a rash ACE inhibitors.

## 2011-08-29 NOTE — Assessment & Plan Note (Addendum)
Continue aspirin. Resume statin. We'll begin Zocor 40 mg daily and check lipids and liver in 6 weeks. Patient apparently had a functional study in [place] 2 months ago. We will obtain those results for review. Note patient also has a prescription for digoxin and I asked him to discontinue this.

## 2011-08-29 NOTE — Patient Instructions (Signed)
Your physician recommends that you schedule a follow-up appointment in: 8-12 WEEKS  START METOPROLOL SUCC 25 MG ONCE DAILY  START SIMVASTATIN 40 MG ONCE DAILY AT BEDTIME  Your physician recommends that you return for lab work in: 6 WEEKS=FASTING  REFERRAL TO PCP THAT TAKES MEDICAID

## 2011-09-11 ENCOUNTER — Telehealth: Payer: Self-pay | Admitting: *Deleted

## 2011-09-11 DIAGNOSIS — R9430 Abnormal result of cardiovascular function study, unspecified: Secondary | ICD-10-CM

## 2011-09-11 NOTE — Telephone Encounter (Signed)
Spoke with pt, echo and follow up scheduled.

## 2011-09-11 NOTE — Telephone Encounter (Signed)
Left message for pt to call, we received his nuclear test results from [facility] and it shows an EF% 29. Per dr Jens Som, the pt will need an echo to access LV function and also an office visit with dr Jens Som prior to clearing him for surgery.

## 2011-09-11 NOTE — Telephone Encounter (Signed)
F/u   Patient returning nurse DM call, he can be reached at [phone]

## 2011-09-18 ENCOUNTER — Other Ambulatory Visit: Payer: Self-pay

## 2011-09-18 ENCOUNTER — Other Ambulatory Visit (HOSPITAL_COMMUNITY): Payer: Self-pay | Admitting: Radiology

## 2011-09-18 ENCOUNTER — Ambulatory Visit (HOSPITAL_COMMUNITY): Payer: Medicaid Other | Attending: Cardiology

## 2011-09-18 DIAGNOSIS — E119 Type 2 diabetes mellitus without complications: Secondary | ICD-10-CM | POA: Insufficient documentation

## 2011-09-18 DIAGNOSIS — I10 Essential (primary) hypertension: Secondary | ICD-10-CM | POA: Insufficient documentation

## 2011-09-18 DIAGNOSIS — I2510 Atherosclerotic heart disease of native coronary artery without angina pectoris: Secondary | ICD-10-CM

## 2011-09-18 DIAGNOSIS — F17200 Nicotine dependence, unspecified, uncomplicated: Secondary | ICD-10-CM | POA: Insufficient documentation

## 2011-09-18 DIAGNOSIS — I517 Cardiomegaly: Secondary | ICD-10-CM | POA: Insufficient documentation

## 2011-09-18 DIAGNOSIS — E785 Hyperlipidemia, unspecified: Secondary | ICD-10-CM | POA: Insufficient documentation

## 2011-09-18 DIAGNOSIS — R9430 Abnormal result of cardiovascular function study, unspecified: Secondary | ICD-10-CM

## 2011-09-18 MED ORDER — PERFLUTREN PROTEIN A MICROSPH IV SUSP: 0.5000 mL | Freq: Once | INTRAVENOUS | Status: DC

## 2011-09-20 ENCOUNTER — Encounter: Payer: Self-pay | Admitting: Cardiology

## 2011-09-20 ENCOUNTER — Ambulatory Visit (INDEPENDENT_AMBULATORY_CARE_PROVIDER_SITE_OTHER): Payer: Medicaid Other | Admitting: Cardiology

## 2011-09-20 VITALS — BP 144/76 | HR 60 | Ht 69.0 in | Wt 195.0 lb

## 2011-09-20 DIAGNOSIS — F17200 Nicotine dependence, unspecified, uncomplicated: Secondary | ICD-10-CM

## 2011-09-20 DIAGNOSIS — Z720 Tobacco use: Secondary | ICD-10-CM

## 2011-09-20 DIAGNOSIS — I2510 Atherosclerotic heart disease of native coronary artery without angina pectoris: Secondary | ICD-10-CM

## 2011-09-20 DIAGNOSIS — I10 Essential (primary) hypertension: Secondary | ICD-10-CM

## 2011-09-20 DIAGNOSIS — E785 Hyperlipidemia, unspecified: Secondary | ICD-10-CM

## 2011-09-20 MED ORDER — METOPROLOL SUCCINATE ER 50 MG PO TB24: 50.0000 mg | ORAL_TABLET | Freq: Every day | ORAL | Status: DC

## 2011-09-20 NOTE — Progress Notes (Signed)
   HPI: 51 yo gentleman for fu of coronary artery disease. He does have a history of PCI. His most recent cardiac catheterization in September of 2008 showed a 20% left main. There was a 50% LAD at the takeoff of the first diagonal. There was a 70% mid LAD. The first diagonal had a 90% tubular lesion. There was a 30-40% circumflex. The right coronary was subtotally occluded. There were left to right collaterals and the EF was 60. He has had problems with noncompliance with FU. Patient had a myoview in [place] in March of 2013 that showed an EF of 29%; no ischemia noted. There was a fixed defect involving the apical portion of the inferior wall, anterior wall and lateral wall.. Echo in May of 2013 revealed an EF of 60-65 with hypokinesis of the distal anteroseptal and apical myocardium, mild LAE. Since I last saw him in May of 2013, he has some dyspnea on exertion no orthopnea, PND, pedal edema, syncope or exertional chest pain.    Current Outpatient Prescriptions  Medication Sig Dispense Refill  . aspirin 325 MG tablet Take 325 mg by mouth daily.      . metFORMIN (GLUCOPHAGE) 1000 MG tablet Take 1,000 mg by mouth 2 (two) times daily with a meal.      . metoprolol succinate (TOPROL XL) 25 MG 24 hr tablet Take 1 tablet (25 mg total) by mouth daily.  30 tablet  12  . simvastatin (ZOCOR) 40 MG tablet Take 1 tablet (40 mg total) by mouth at bedtime.  30 tablet  12     Past Medical History  Diagnosis Date  . Hypertension   . Diabetes mellitus   . Hyperlipidemia   . CAD (coronary artery disease)   . Anxiety   . Colon polyps   . Chronic back pain     Past Surgical History  Procedure Date  . Coronary angioplasty with stent placement     History   Social History  . Marital Status: Legally Separated    Spouse Name: N/A    Number of Children: N/A  . Years of Education: N/A   Occupational History  .      Unemployed   Social History Main Topics  . Smoking status: Current Everyday Smoker    . Smokeless tobacco: Not on file  . Alcohol Use: No  . Drug Use: Not on file  . Sexually Active: Not on file   Other Topics Concern  . Not on file   Social History Narrative  . No narrative on file    ROS: problems with panic attacks and back pain but no fevers or chills, productive cough, hemoptysis, dysphasia, odynophagia, melena, hematochezia, dysuria, hematuria, rash, seizure activity, orthopnea, PND, pedal edema, claudication. Remaining systems are negative.  Physical Exam: Well-developed well-nourished in no acute distress.  Skin is warm and dry.  HEENT is normal.  Neck is supple. Chest is clear to auscultation with normal expansion.  Cardiovascular exam is regular rate and rhythm.  Abdominal exam nontender or distended. No masses palpated. Extremities show no edema. neuro grossly intact

## 2011-09-20 NOTE — Assessment & Plan Note (Signed)
Continue aspirin and statin. Outside Myoview showed infarct but no ischemia. There was reduced LV function but followup echocardiogram showed ejection fraction of 60-65%.

## 2011-09-20 NOTE — Assessment & Plan Note (Signed)
Blood pressure elevated. Increase Toprol to 50 mg daily. 

## 2011-09-20 NOTE — Assessment & Plan Note (Signed)
Patient counseled on discontinuing. 

## 2011-09-20 NOTE — Assessment & Plan Note (Signed)
Continue statin. Lipids and liver in 2 weeks.

## 2011-09-20 NOTE — Patient Instructions (Signed)
Your physician wants you to follow-up in: 6 MONTHS WITH DR Jens Som You will receive a reminder letter in the mail two months in advance. If you don't receive a letter, please call our office to schedule the follow-up appointment.   INCREASE METOPROLOL SUCC TO 50 MG ONCE DAILY

## 2011-09-21 ENCOUNTER — Encounter: Payer: Self-pay | Admitting: Cardiology

## 2011-10-08 ENCOUNTER — Other Ambulatory Visit: Payer: Medicaid Other

## 2011-10-17 ENCOUNTER — Encounter: Payer: Self-pay | Admitting: *Deleted

## 2011-10-17 ENCOUNTER — Telehealth: Payer: Self-pay | Admitting: Cardiology

## 2011-10-17 NOTE — Telephone Encounter (Signed)
Spoke with pt, awae letter of clearance faxed to the number provided.

## 2011-10-17 NOTE — Telephone Encounter (Signed)
Please return call to patient at [phone] regarding surgical clearance he received at last appnt 09/20/11.  The document was never sent to Dentist Dr. Ocie Doyne for surgical procedure.  Fax # [phone].  Please return call to patient

## 2011-10-26 ENCOUNTER — Other Ambulatory Visit: Payer: Medicaid Other

## 2011-10-29 ENCOUNTER — Ambulatory Visit: Payer: Medicaid Other | Admitting: Cardiology

## 2011-11-23 ENCOUNTER — Other Ambulatory Visit (HOSPITAL_COMMUNITY): Payer: Self-pay | Admitting: Oral Surgery

## 2011-11-26 ENCOUNTER — Encounter (HOSPITAL_COMMUNITY): Payer: Self-pay | Admitting: Pharmacy Technician

## 2011-11-28 MED ORDER — BUPIVACAINE-EPINEPHRINE PF 0.25-1:200000 % IJ SOLN
INTRAMUSCULAR | Status: AC
  Filled 2011-11-28: qty 30

## 2011-12-05 ENCOUNTER — Encounter (HOSPITAL_COMMUNITY)
Admission: RE | Admit: 2011-12-05 | Discharge: 2011-12-05 | Disposition: A | Discharge status: Home / Self Care | Payer: Medicaid Other | Source: Ambulatory Visit | Attending: Oral Surgery | Admitting: Oral Surgery

## 2011-12-05 ENCOUNTER — Encounter (HOSPITAL_COMMUNITY): Payer: Self-pay

## 2011-12-05 HISTORY — DX: Panic disorder (episodic paroxysmal anxiety): F41.0

## 2011-12-05 LAB — CBC
HCT: 46.4 % (ref 39.0–52.0)
Hemoglobin: 16.3 g/dL (ref 13.0–17.0)
MCH: 30.4 pg (ref 26.0–34.0)
Platelets: 245 10*3/uL (ref 150–400)
RBC: 5.37 MIL/uL (ref 4.22–5.81)
WBC: 13 10*3/uL — ABNORMAL HIGH (ref 4.0–10.5)

## 2011-12-05 LAB — BASIC METABOLIC PANEL
BUN: 12 mg/dL (ref 6–23)
GFR calc Af Amer: >90 mL/min (ref >90)
GFR calc non Af Amer: >90 mL/min (ref >90)
Glucose, Bld: 118 mg/dL — ABNORMAL HIGH (ref 70–99)
Potassium: 4.3 mEq/L (ref 3.5–5.1)
Sodium: 140 mEq/L (ref 135–145)

## 2011-12-05 NOTE — Progress Notes (Signed)
Patient came in severely anxious during PAT. Patient reports being told by Dr. Barbette Merino that he we would see an anesthelogist and be given something for anxiety for the DOS. Reassured patient. Patient concerned that anxiety is going untreated by PCP and that he is not happy with PCP patient given number to [facility]. Spoke with Revonda Standard, [place] regarding patient request

## 2011-12-05 NOTE — Pre-Procedure Instructions (Signed)
[name]  12/05/2011   Your procedure is scheduled on:  August 19  Report to [facility] at 05:30 AM.  Call this number if you have problems the morning of surgery: [phone]   Remember:   Do not eat and drink:After Midnight.  Take these medicines the morning of surgery with A SIP OF WATER: Metoprolol   Do not wear jewelry, make-up or nail polish.  Do not wear lotions, powders, or perfumes. You may wear deodorant.  Do not shave 48 hours prior to surgery. Men may shave face and neck.  Do not bring valuables to the hospital.  Contacts, dentures or bridgework may not be worn into surgery.  Leave suitcase in the car. After surgery it may be brought to your room.  For patients admitted to the hospital, checkout time is 11:00 AM the day of discharge.   Patients discharged the day of surgery will not be allowed to drive home.  Name and phone number of your driver: [name] [phone]  Special Instructions: CHG Shower Use Special Wash: 1/2 bottle night before surgery and 1/2 bottle morning of surgery.   Please read over the following fact sheets that you were given: Pain Booklet, Coughing and Deep Breathing and Surgical Site Infection Prevention

## 2011-12-06 NOTE — Consult Note (Signed)
Anesthesia consult: Patient is a 51 year old male scheduled for multiple teeth extraction, alveoloplasty, right maxillary tuberosity reduction, right maxillary buccal exostosis, bilateral mandibular tori by Dr. Barbette Merino on 12/10/11.  History includes coronary artery disease/MI s/p PCI DIAG '05, BMS to OM2 '07,  s/p Cx stent, hypertension, diabetes mellitus type 2, hyperlipidemia, anxiety with panic attacks, chronic back pain, smoking.  He specifically has panic attacks related to dental procedures. This dates back to teeth extractions as a child, in which he says he was strapped down and was not sedated.   At his PAT visit on 12/05/11, he had actually asked for pain and anti-anxiety medication to take prior to his arrival on the day of surgery. I told him that the Anesthesiologist would not prescribe any medications until the day of surgery when he was under their care.  Any medication prior to that would have to come from Dr. Barbette Merino or his PCP (Dr. Lafayette Dragon in [place]).    His cardiologist is Dr. Olga Millers.  He cleared patient for this procedure. He was last seen on 09/20/2011.  His last cath was in 2008.  He EF was listed at 29% by stress test in March 2013, but was measured at 60-65% by echo in May 2013 (see below).  The patient denies any new CV symptoms since that visit.  EKG on 08/29/11 showed normal sinus rhythm, left anterior fascicular block, anterolateral infarct, age undetermined.  Echo on 09/18/11 showed: Left ventricle: The cavity size was normal. Wall thickness was increased in a pattern of mild LVH. Systolic function was normal. The estimated ejection fraction was in the range of 60% to 65%. There is severe hypokinesis of the distalanteroseptal and apical myocardium. Left ventricular diastolic function parameters were normal. - Left atrium: The atrium was mildly dilated. - Trivial tricuspid regurgitation.  Nuclear stress test on 06/28/11 [facility]) showed no ischemia, large apical  infarct with EF 29%.  Cardiac catheterization on 01/02/07 showed 20% left main, LAD 70% in the midportion, 90% first diagonal, circumflex 30-40% with a patent stent in the RCA subtotal with left-to-right collaterals. His EF was 60%. The films were reviewed with Dr. Excell Seltzer and consideration of a stent to the mid LAD for the 70% lesion. The first diagonal has a 90% stenosis, but it is a small vessel. Dr. Excell Seltzer and Dr. Eden Emms decided that the LAD lesion was not flow limiting. Medical therapy was recommended, and compliance is to be emphasized.  Chest x-ray on 12/05/2011 showed no acute cardiopulmonary findings.  Labs noted.  If no significant change in his status then anticipate he can proceed as planned.  He will likely need an anxiolytic in the Holding area due to his anxiety/aversion to dental procedures.  Shonna Chock, PA-C

## 2011-12-10 ENCOUNTER — Encounter (HOSPITAL_COMMUNITY): Payer: Self-pay | Admitting: Vascular Surgery

## 2011-12-10 ENCOUNTER — Ambulatory Visit (HOSPITAL_COMMUNITY)
Admission: RE | Admit: 2011-12-10 | Discharge: 2011-12-10 | Disposition: A | Discharge status: Home / Self Care | Payer: Medicaid Other | Source: Ambulatory Visit | Attending: Oral Surgery | Admitting: Oral Surgery

## 2011-12-10 ENCOUNTER — Ambulatory Visit (HOSPITAL_COMMUNITY): Payer: Medicaid Other | Admitting: Vascular Surgery

## 2011-12-10 ENCOUNTER — Encounter (HOSPITAL_COMMUNITY): Payer: Self-pay | Admitting: *Deleted

## 2011-12-10 ENCOUNTER — Encounter (HOSPITAL_COMMUNITY): Payer: Self-pay | Admitting: Anesthesiology

## 2011-12-10 ENCOUNTER — Encounter (HOSPITAL_COMMUNITY)
Admission: RE | Disposition: A | Discharge status: Home / Self Care | Payer: Self-pay | Source: Ambulatory Visit | Attending: Oral Surgery

## 2011-12-10 DIAGNOSIS — Z720 Tobacco use: Secondary | ICD-10-CM

## 2011-12-10 DIAGNOSIS — M549 Dorsalgia, unspecified: Secondary | ICD-10-CM | POA: Insufficient documentation

## 2011-12-10 DIAGNOSIS — M270 Developmental disorders of jaws: Secondary | ICD-10-CM

## 2011-12-10 DIAGNOSIS — K219 Gastro-esophageal reflux disease without esophagitis: Secondary | ICD-10-CM | POA: Insufficient documentation

## 2011-12-10 DIAGNOSIS — K089 Disorder of teeth and supporting structures, unspecified: Secondary | ICD-10-CM | POA: Insufficient documentation

## 2011-12-10 DIAGNOSIS — F410 Panic disorder [episodic paroxysmal anxiety] without agoraphobia: Secondary | ICD-10-CM | POA: Insufficient documentation

## 2011-12-10 DIAGNOSIS — F411 Generalized anxiety disorder: Secondary | ICD-10-CM | POA: Insufficient documentation

## 2011-12-10 DIAGNOSIS — E119 Type 2 diabetes mellitus without complications: Secondary | ICD-10-CM | POA: Insufficient documentation

## 2011-12-10 DIAGNOSIS — E785 Hyperlipidemia, unspecified: Secondary | ICD-10-CM | POA: Insufficient documentation

## 2011-12-10 DIAGNOSIS — G8929 Other chronic pain: Secondary | ICD-10-CM | POA: Insufficient documentation

## 2011-12-10 DIAGNOSIS — M2749 Other cysts of jaw: Secondary | ICD-10-CM | POA: Insufficient documentation

## 2011-12-10 DIAGNOSIS — K029 Dental caries, unspecified: Secondary | ICD-10-CM

## 2011-12-10 DIAGNOSIS — I2510 Atherosclerotic heart disease of native coronary artery without angina pectoris: Secondary | ICD-10-CM | POA: Insufficient documentation

## 2011-12-10 DIAGNOSIS — F17200 Nicotine dependence, unspecified, uncomplicated: Secondary | ICD-10-CM | POA: Insufficient documentation

## 2011-12-10 DIAGNOSIS — I10 Essential (primary) hypertension: Secondary | ICD-10-CM | POA: Insufficient documentation

## 2011-12-10 DIAGNOSIS — M278 Other specified diseases of jaws: Secondary | ICD-10-CM | POA: Insufficient documentation

## 2011-12-10 HISTORY — PX: MULTIPLE EXTRACTIONS WITH ALVEOLOPLASTY: SHX5342

## 2011-12-10 HISTORY — PX: EAR CYST EXCISION: SHX22

## 2011-12-10 LAB — GLUCOSE, CAPILLARY: Glucose-Capillary: 190 mg/dL — ABNORMAL HIGH (ref 70–99)

## 2011-12-10 SURGERY — MULTIPLE EXTRACTION WITH ALVEOLOPLASTY
Anesthesia: General | Site: Mouth | Laterality: Right | Wound class: Clean Contaminated

## 2011-12-10 MED ORDER — SUCCINYLCHOLINE CHLORIDE 20 MG/ML IJ SOLN
INTRAMUSCULAR | Status: DC | PRN
  Administered 2011-12-10: 100 mg via INTRAVENOUS

## 2011-12-10 MED ORDER — EPHEDRINE SULFATE 50 MG/ML IJ SOLN
INTRAMUSCULAR | Status: DC | PRN
  Administered 2011-12-10 (×3): 5 mg via INTRAVENOUS

## 2011-12-10 MED ORDER — LIDOCAINE-EPINEPHRINE 2 %-1:100000 IJ SOLN
INTRAMUSCULAR | Status: AC
  Filled 2011-12-10: qty 1

## 2011-12-10 MED ORDER — LIDOCAINE HCL (CARDIAC) 20 MG/ML IV SOLN
INTRAVENOUS | Status: DC | PRN
  Administered 2011-12-10: 100 mg via INTRAVENOUS

## 2011-12-10 MED ORDER — ROCURONIUM BROMIDE 100 MG/10ML IV SOLN
INTRAVENOUS | Status: DC | PRN
  Administered 2011-12-10: 10 mg via INTRAVENOUS

## 2011-12-10 MED ORDER — SODIUM CHLORIDE 0.9 % IR SOLN
Status: DC | PRN
  Administered 2011-12-10: 1

## 2011-12-10 MED ORDER — PROPOFOL 10 MG/ML IV EMUL
INTRAVENOUS | Status: DC | PRN
  Administered 2011-12-10: 130 mg via INTRAVENOUS

## 2011-12-10 MED ORDER — LACTATED RINGERS IV SOLN
INTRAVENOUS | Status: DC | PRN
  Administered 2011-12-10: 08:00:00 via INTRAVENOUS

## 2011-12-10 MED ORDER — CEFAZOLIN SODIUM 1-5 GM-% IV SOLN
INTRAVENOUS | Status: DC | PRN
  Administered 2011-12-10: 1 g via INTRAVENOUS

## 2011-12-10 MED ORDER — LIDOCAINE-EPINEPHRINE 2 %-1:100000 IJ SOLN
INTRAMUSCULAR | Status: DC | PRN
  Administered 2011-12-10: 18 mL

## 2011-12-10 MED ORDER — ONDANSETRON HCL 4 MG/2ML IJ SOLN
INTRAMUSCULAR | Status: DC | PRN
  Administered 2011-12-10: 4 mg via INTRAVENOUS

## 2011-12-10 MED ORDER — HYDROMORPHONE HCL PF 1 MG/ML IJ SOLN: 0.2500 mg | INTRAMUSCULAR | Status: DC | PRN

## 2011-12-10 MED ORDER — ONDANSETRON HCL 4 MG/2ML IJ SOLN: 4.0000 mg | Freq: Once | INTRAMUSCULAR | Status: DC | PRN

## 2011-12-10 MED ORDER — SODIUM CHLORIDE 0.9 % IV SOLN
INTRAVENOUS | Status: DC | PRN
  Administered 2011-12-10: 10:00:00 via INTRAVENOUS

## 2011-12-10 MED ORDER — ONDANSETRON HCL 4 MG PO TABS: 4.0000 mg | ORAL_TABLET | Freq: Three times a day (TID) | ORAL | Status: AC | PRN

## 2011-12-10 MED ORDER — OXYMETAZOLINE HCL 0.05 % NA SOLN
NASAL | Status: AC
  Filled 2011-12-10: qty 15

## 2011-12-10 MED ORDER — ACETAMINOPHEN 10 MG/ML IV SOLN
INTRAVENOUS | Status: AC
  Filled 2011-12-10: qty 100

## 2011-12-10 MED ORDER — OXYCODONE-ACETAMINOPHEN 5-325 MG PO TABS: 1.0000 | ORAL_TABLET | ORAL | Status: AC | PRN

## 2011-12-10 MED ORDER — FENTANYL CITRATE 0.05 MG/ML IJ SOLN
INTRAMUSCULAR | Status: DC | PRN
  Administered 2011-12-10: 150 ug via INTRAVENOUS
  Administered 2011-12-10 (×2): 50 ug via INTRAVENOUS

## 2011-12-10 MED ORDER — OXYMETAZOLINE HCL 0.05 % NA SOLN
NASAL | Status: DC | PRN
  Administered 2011-12-10: 1 via NASAL

## 2011-12-10 MED ORDER — ACETAMINOPHEN 10 MG/ML IV SOLN
INTRAVENOUS | Status: DC | PRN
  Administered 2011-12-10: 1000 mg via INTRAVENOUS

## 2011-12-10 MED ORDER — CEFAZOLIN SODIUM 1-5 GM-% IV SOLN
INTRAVENOUS | Status: AC
  Filled 2011-12-10: qty 50

## 2011-12-10 MED ORDER — MIDAZOLAM HCL 5 MG/5ML IJ SOLN
INTRAMUSCULAR | Status: DC | PRN
  Administered 2011-12-10: 2 mg via INTRAVENOUS

## 2011-12-10 MED ORDER — NEOSTIGMINE METHYLSULFATE 1 MG/ML IJ SOLN
INTRAMUSCULAR | Status: DC | PRN
  Administered 2011-12-10: .5 mg via INTRAVENOUS

## 2011-12-10 SURGICAL SUPPLY — 32 items
BLADE SURG 15 STRL LF DISP TIS (BLADE) IMPLANT
BLADE SURG 15 STRL SS (BLADE) ×4
BUR CROSS CUT FISSURE 1.6 (BURR) ×4 IMPLANT
BUR EGG ELITE 4.0 (BURR) ×4 IMPLANT
CANISTER SUCTION 2500CC (MISCELLANEOUS) ×4 IMPLANT
CLOTH BEACON ORANGE TIMEOUT ST (SAFETY) ×4 IMPLANT
COVER SURGICAL LIGHT HANDLE (MISCELLANEOUS) ×4 IMPLANT
CRADLE DONUT ADULT HEAD (MISCELLANEOUS) ×4 IMPLANT
DECANTER SPIKE VIAL GLASS SM (MISCELLANEOUS) ×4 IMPLANT
GAUZE PACKING FOLDED 2  STR (GAUZE/BANDAGES/DRESSINGS) ×1
GAUZE PACKING FOLDED 2 STR (GAUZE/BANDAGES/DRESSINGS) ×3 IMPLANT
GLOVE BIO SURGEON STRL SZ 6.5 (GLOVE) ×4 IMPLANT
GLOVE BIO SURGEON STRL SZ7 (GLOVE) ×1 IMPLANT
GLOVE BIO SURGEON STRL SZ7.5 (GLOVE) ×5 IMPLANT
GLOVE BIOGEL PI IND STRL 7.0 (GLOVE) ×3 IMPLANT
GLOVE BIOGEL PI INDICATOR 7.0 (GLOVE) ×2
GOWN STRL NON-REIN LRG LVL3 (GOWN DISPOSABLE) ×8 IMPLANT
GOWN STRL REIN XL XLG (GOWN DISPOSABLE) ×4 IMPLANT
KIT BASIN OR (CUSTOM PROCEDURE TRAY) ×4 IMPLANT
KIT ROOM TURNOVER OR (KITS) ×4 IMPLANT
NEEDLE 22X1 1/2 (OR ONLY) (NEEDLE) ×5 IMPLANT
NS IRRIG 1000ML POUR BTL (IV SOLUTION) ×4 IMPLANT
PAD ARMBOARD 7.5X6 YLW CONV (MISCELLANEOUS) ×8 IMPLANT
SUT CHROMIC 3 0 PS 2 (SUTURE) ×6 IMPLANT
SYR 50ML SLIP (SYRINGE) IMPLANT
SYR CONTROL 10ML LL (SYRINGE) ×4 IMPLANT
TOWEL OR 17X26 10 PK STRL BLUE (TOWEL DISPOSABLE) ×4 IMPLANT
TRAY ENT MC OR (CUSTOM PROCEDURE TRAY) ×4 IMPLANT
TUBE CONNECTING 12X1/4 (SUCTIONS) ×2 IMPLANT
TUBING IRRIGATION (MISCELLANEOUS) ×1 IMPLANT
WATER STERILE IRR 1000ML POUR (IV SOLUTION) IMPLANT
YANKAUER SUCT BULB TIP NO VENT (SUCTIONS) ×4 IMPLANT

## 2011-12-10 NOTE — Transfer of Care (Signed)
Immediate Anesthesia Transfer of Care Note  Patient: [name]  Procedure(s) Performed: Procedure(s) (LRB): MULTIPLE EXTRACION WITH ALVEOLOPLASTY (N/A) TUBEROSITY REDUCTION (Right) CYST REMOVAL (Left)  Patient Location: PACU  Anesthesia Type: General  Level of Consciousness: awake, alert  and oriented  Airway & Oxygen Therapy: Patient Spontanous Breathing and Patient connected to nasal cannula oxygen  Post-op Assessment: Report given to PACU RN  Post vital signs: Reviewed and stable  Complications: No apparent anesthesia complications

## 2011-12-10 NOTE — Anesthesia Postprocedure Evaluation (Signed)
Anesthesia Post Note  Patient: [name]  Procedure(s) Performed: Procedure(s) (LRB): MULTIPLE EXTRACION WITH ALVEOLOPLASTY (N/A) TUBEROSITY REDUCTION (Right) CYST REMOVAL (Left)  Anesthesia type: general  Patient location: PACU  Post pain: Pain level controlled  Post assessment: Patient's Cardiovascular Status Stable  Last Vitals:  Filed Vitals:   12/10/11 1015  BP:   Pulse:   Temp: 36.1 C  Resp:     Post vital signs: Reviewed and stable  Level of consciousness: sedated  Complications: No apparent anesthesia complications

## 2011-12-10 NOTE — Anesthesia Preprocedure Evaluation (Addendum)
Anesthesia Evaluation  Patient identified by MRN, date of birth, ID band Patient awake    Reviewed: Allergy & Precautions, H&P , NPO status , Patient's Chart, lab work & pertinent test results, reviewed documented beta blocker date and time   Airway Mallampati: II TM Distance: >3 FB Neck ROM: Full  Mouth opening: Limited Mouth Opening  Dental  (+) Dental Advisory Given   Pulmonary Current Smoker,  breath sounds clear to auscultation        Cardiovascular hypertension, Pt. on medications and Pt. on home beta blockers + CAD and + Cardiac Stents Rhythm:Regular Rate:Normal     Neuro/Psych Anxiety negative neurological ROS     GI/Hepatic Neg liver ROS, GERD-  ,  Endo/Other  Well Controlled, Oral Hypoglycemic Agents  Renal/GU negative Renal ROS     Musculoskeletal negative musculoskeletal ROS (+)   Abdominal Normal abdominal exam  (+)   Peds  Hematology negative hematology ROS (+)   Anesthesia Other Findings   Reproductive/Obstetrics                          Anesthesia Physical Anesthesia Plan  ASA: III  Anesthesia Plan: General   Post-op Pain Management:    Induction: Intravenous, Rapid sequence and Cricoid pressure planned  Airway Management Planned: Nasal ETT  Additional Equipment:   Intra-op Plan:   Post-operative Plan: Extubation in OR  Informed Consent: I have reviewed the patients History and Physical, chart, labs and discussed the procedure including the risks, benefits and alternatives for the proposed anesthesia with the patient or authorized representative who has indicated his/her understanding and acceptance.   Dental advisory given  Plan Discussed with: Surgeon and CRNA  Anesthesia Plan Comments:        Anesthesia Quick Evaluation

## 2011-12-10 NOTE — Preoperative (Signed)
Beta Blockers   Reason not to administer Beta Blockers:Not Applicable, took yesterday 12/09/11 at 1500

## 2011-12-10 NOTE — Op Note (Signed)
12/10/2011  9:38 AM  PATIENT:  [name]  51 y.o. male  PRE-OPERATIVE DIAGNOSIS:  Non-Restorable Teeth #'s 1, 13, 14, 15, 16, 17, 18, 19, 20, 24, 25, 28, Bilateral mandibular lingual tori, right maxillary buccal exostosis, right maxillary tuberosity hyperplasia  POST-OPERATIVE DIAGNOSIS:  SAME plus retained root tooth # 21, cyst left mandible  PROCEDURE:  Procedure(s): MULTIPLE EXTRACION  Teeth #'s 1, 13, 14, 15, 16, 17, 18, 19, 20, 21 24, 25, 28, WITH ALVEOLOPLASTY. Right maxillary TUBEROSITY REDUCTION, bilateral mandibular lingual tori removal, removal right maxillary buccal exostosis.  Removal cyst left mandible.  SURGEON:  Surgeon(s): [place] Lopes, DDS  ANESTHESIA:   local and general  EBL:  minimal  DRAINS: none   SPECIMEN:  No Specimen  COUNTS:  YES  PLAN OF CARE: Discharge to home after PACU  PATIENT DISPOSITION:  PACU - hemodynamically stable.   PROCEDURE DETAILS: Dictation #   [place]ia Lopes, DMD 12/10/2011 9:38 AM

## 2011-12-10 NOTE — H&P (Signed)
HISTORY AND PHYSICAL  [name] is a 51 y.o. male patient with [id number] teeth.  No diagnosis found.  Past Medical History  Diagnosis Date  . Hypertension   . Diabetes mellitus   . Hyperlipidemia   . CAD (coronary artery disease)   . Anxiety   . Colon polyps   . Chronic back pain   . Panic attacks     No current facility-administered medications for this encounter.   Allergies  Allergen Reactions  . Hydrocodone Nausea Only  . Lisinopril Itching and Rash   Active Problems:  * No active hospital problems. *   Vitals: Blood pressure 139/80, pulse 60, temperature 97 F (36.1 C), temperature source Oral, resp. rate 18, SpO2 96.00%. Lab results: Results for orders placed during the hospital encounter of 12/10/11 (from the past 24 hour(s))  GLUCOSE, CAPILLARY     Status: Abnormal   Collection Time   12/10/11  6:01 AM      Component Value Range   Glucose-Capillary 190 (*) 70 - 99 mg/dL   Radiology Results: No results found. General appearance: alert, cooperative and no distress Head: Normocephalic, without obvious abnormality, atraumatic Eyes: negative Ears: normal TM's and external ear canals both ears Nose: Nares normal. Septum midline. Mucosa normal. No drainage or sinus tenderness. Throat: Impacted and decayed teeth #1, 13, 14, 15, 16, 17, 18, 19, 20, 24, 25, 28 Neck: no adenopathy, supple, symmetrical, trachea midline and thyroid not enlarged, symmetric, no tenderness/mass/nodules Resp: clear to auscultation bilaterally Cardio: regular rate and rhythm, S1, S2 normal, no murmur, click, rub or gallop GI: soft, non-tender; bowel sounds normal; no masses,  no organomegaly  Assessment:51 WM HTN, DM, Anxiety,CAD, Chronic Back Pain, Panic Attacks with Impacted and decayed teeth #1, 13, 14, 15, 16, 17, 18, 19, 20, 24, 25, 28  Plan: Extract Impacted and decayed teeth #1, 13, 14, 15, 16, 17, 18, 19, 20, 24, 25, 28. General anesthesia, day surgery.  [name] 12/10/2011

## 2011-12-11 ENCOUNTER — Encounter (HOSPITAL_COMMUNITY): Payer: Self-pay | Admitting: Oral Surgery

## 2011-12-11 NOTE — Op Note (Signed)
NAME:  [name], [name]             ACCOUNT NO.:  [id number]  MEDICAL RECORD NO.:  [id number]  LOCATION:  [facility]                         FACILITY:  [facility]  PHYSICIAN:  [place] Lopes, M.D.  DATE OF BIRTH:  1960-08-16  DATE OF PROCEDURE:  12/10/2011 DATE OF DISCHARGE:  12/10/2011                              OPERATIVE REPORT   PREOPERATIVE DIAGNOSES:  Nonrestorable teeth #1, #13, #14, #15, #16, #17, #18, #19, #20, #24, #25, #28; bilateral mandibular lingual tori; right maxillary buccal exostosis; right maxillary tuberosity; hyperplasia.  POSTOPERATIVE DIAGNOSES:  Nonrestorable teeth #1, #13, #14, #15, #16, #17, #18, #19, #20, #24, #25, #28; bilateral mandibular lingual tori; right maxillary buccal exostosis; right maxillary tuberosity; hyperplasia.  Retained tooth retained tooth root tooth #21 and cyst left mandible.  PROCEDURE:  Extraction teeth #1, #13, #14, #15, #16, #17, #18, #19, #20, #24, #25, #28; alveoplasty; right and left maxilla mandible; right maxillary tuberosity reduction; bilateral mandibular lingual tori removal; removal of right maxillary buccal exostosis; removal cyst left mandible.  SURGEON:  [place]ia Lopes, M.D.  ANESTHESIA:  General, Dr. Michelle Piper, attending .  INDICATIONS FOR PROCEDURE:  [name] is a 51 year old male, who was referred to me by his general dentist for removal of all remaining teeth, so that dentures could be fabricated.  He has a past medical history significant for hypertension, severe anxiety, chronic back pain, hyperlipidemia, and panic attacks.  Because of the number of teeth to be removed in the matter of bone, work to be performed.  It was recommended that general anesthesia be used.  The case was scheduled at [facility] for intubation with airway protection.  PROCEDURE IN DETAIL:  The patient was taken to the operating room, placed on the table in supine position.  General anesthesia was administered intravenously and a nasal endotracheal  tube was placed with rapid sequence technique.  Tube was secured.  The eyes were protected. The patient was draped for the procedure.  Time-out was performed.  The posterior pharynx was suctioned with Yankauer suction.  The throat pack was placed.  Then, 2% lidocaine 1:100,000 epinephrine was infiltrated in an inferior alveolar block on the right and left sides, and a buccal and palatal infiltration of the maxilla, a total of 18 mL was utilized. Then, a 15 blade was used to make a full-thickness incision in the left mandible, buccal and lingual to teeth #17, #18, #19, #20, and carried anteriorly to teeth #24 and #25 in the maxilla.  The 15 blade was used to make an incision overlying tooth #16 and carried anteriorly along teeth numbers #14 and #13, #15 and carried to the area of the canine of the maxilla.  The periosteal elevator was used to reflect the flap in the maxilla on the buccal and palatal aspects, so the alveolar bone was exposed and the periosteal elevator was used to expose the bone in the mandible upon exposing the area of tooth #21.  There was a root present underneath the soft tissues.  The Stryker handpiece with a fissure bur under outer irrigation was used to remove bone around this tooth and around the other teeth in the mandible and in the maxilla, buccal bone was removed around  the impacted teeth #16 and #17.  The teeth were then elevated and removed with the upper universal forceps, lower Kelman forceps, and lower Ash forceps.  Sockets were then curetted.  There was what appeared to be a cyst in the area of tooth #17.  This is removed and submitted for pathological examination.  The periosteum was then further reflected to expose the alveolar bone in the maxilla mandible, and to expose the lingual tori in the mandible.  Then, the egg-shaped bur was used to perform alveoplasty in the left maxilla mandible and to remove the torus of the left lingual mandible.  The bone  file was then used to further smooth all these areas and the areas were irrigated and closed with 3-0 chromic nylon.  Then, attention was turned to the right maxilla mandible.  A 15 blade was used to make a full-thickness incision beginning at the retromolar region and carried anteriorly to tooth #28 and connected to the midline where the previous incisors teeth were removed in the mandible.  A 15 blade was then used to make a full- thickness incision overlying tooth #1 in the posterior aspect of the tuberosity and carried anteriorly to approximately the canine area with a vertical release.  The periosteal elevator was used to elevate the flaps and then the bone was removed around tooth #28 and around tooth #1.  The teeth were elevated and removed with the forceps.  The periosteum was further reflected to expose the tuberosity and the buccal exostosis in the maxilla, and the alveolar crest of the maxilla and mandible.  The lingual tori were also exposed using the periosteal elevator.  Then, the Seldin retractor was used to protect the lingual tissues and the torus was removed.  Alveoplasty was performed on the right mandible and then alveoplasty and exostosis removal were performed in the right maxilla and the torus was removed with the egg-shaped bur as well.  Then, all areas were smoothed with a bone file, irrigated, and closed with 3-0 chromic.  The patient was awakened, extubated, taken to the recovery room, breathing spontaneously in good condition.  ESTIMATED BLOOD LOSS:  Minimum.  COMPLICATIONS:  None.  SPECIMEN:  Cyst left mandible.  PRIMARY DIAGNOSIS:  Dentigerous cyst.     [place] Lopes, M.D.     SMJ/MEDQ  D:  12/10/2011  T:  12/11/2011  Job:  [id number]

## 2011-12-13 ENCOUNTER — Encounter (HOSPITAL_COMMUNITY): Payer: Self-pay

## 2012-05-13 ENCOUNTER — Telehealth: Payer: Self-pay | Admitting: *Deleted

## 2012-05-13 NOTE — Telephone Encounter (Signed)
Left message for pt to call to schedule appt for surgical clearance. Received fax from scott jensen DMD for clearance.

## 2012-06-03 ENCOUNTER — Ambulatory Visit: Payer: Medicaid Other | Admitting: Physician Assistant

## 2012-06-17 ENCOUNTER — Encounter: Payer: Self-pay | Admitting: Physician Assistant

## 2012-06-17 ENCOUNTER — Ambulatory Visit (INDEPENDENT_AMBULATORY_CARE_PROVIDER_SITE_OTHER): Payer: Medicaid Other | Admitting: Physician Assistant

## 2012-06-17 VITALS — BP 130/84 | HR 58 | Ht 69.0 in | Wt 194.8 lb

## 2012-06-17 DIAGNOSIS — Z720 Tobacco use: Secondary | ICD-10-CM

## 2012-06-17 DIAGNOSIS — E785 Hyperlipidemia, unspecified: Secondary | ICD-10-CM

## 2012-06-17 DIAGNOSIS — I10 Essential (primary) hypertension: Secondary | ICD-10-CM

## 2012-06-17 DIAGNOSIS — F17200 Nicotine dependence, unspecified, uncomplicated: Secondary | ICD-10-CM

## 2012-06-17 DIAGNOSIS — I2510 Atherosclerotic heart disease of native coronary artery without angina pectoris: Secondary | ICD-10-CM

## 2012-06-17 DIAGNOSIS — Z01810 Encounter for preprocedural cardiovascular examination: Secondary | ICD-10-CM

## 2012-06-17 NOTE — Progress Notes (Signed)
[address] [place], [place]  [address] Phone: [phone], Fax:  [phone]  Date:  06/17/2012   ID:  [name], DOB 09/06/1960, MRN [id number]  PCP:  Keane Police, MD  Primary Cardiologist:  Dr. Olga Millers     History of Present Illness: [name] is a 52 y.o. male who returns for surgical clearance.  He needs a dental procedure done with Dr. Ocie Doyne.    He has a hx of CAD, s/p prior PCI, HTN, HL, DM2, anxiety. LHC 9/08: left main 20%, LAD 50% at D1 takeoff, mid LAD 70%, pD1 90%, mid CFX 30-40%, mid CFX and OM1 stent patent, distal RCA subtotally occluded, left to right collaterals, EF 60%.  Medical therapy was continued. Myoview 3/13 at [facility]: Large apical infarct, no ischemia, EF 29%. Echo 5/13: Mild LVH, EF 60-65%, distal anteroseptal and apical severe HK, normal diastolic function, mild LAE. Last seen by Dr. Jens Som 5/13. Since last being seen, he has occasional chest discomfort with certain meals. He denies exertional chest pain. He denies significant dyspnea with exertion. He is NYHA class II. He denies orthopnea, PND or edema. He denies syncope.  Labs (10/12):  K 4.4, creatinine 0.73, ALT 24 Labs (8/13):    K 4.3, creatinine 0.7, Hgb 16.3  Wt Readings from Last 3 Encounters:  06/17/12 194 lb 12.8 oz (88.361 kg)  12/05/11 194 lb 8 oz (88.225 kg)  09/20/11 195 lb (88.451 kg)     Past Medical History  Diagnosis Date  . Hypertension   . Diabetes mellitus   . Hyperlipidemia   . CAD (coronary artery disease)   . Anxiety   . Colon polyps   . Chronic back pain   . Panic attacks     Current Outpatient Prescriptions  Medication Sig Dispense Refill  . aspirin 325 MG tablet Take 325 mg by mouth daily.      . metFORMIN (GLUCOPHAGE) 1000 MG tablet Take 1,000 mg by mouth 2 (two) times daily with a meal.      . metoprolol succinate (TOPROL XL) 50 MG 24 hr tablet Take 1 tablet (50 mg total) by mouth daily.  30  tablet  12  . simvastatin (ZOCOR) 40 MG tablet Take 1 tablet (40 mg total) by mouth at bedtime.  30 tablet  12   No current facility-administered medications for this visit.    Allergies:    Allergies  Allergen Reactions  . Hydrocodone Nausea Only  . Lisinopril Itching and Rash    Social History:  The patient  reports that he has been smoking.  He does not have any smokeless tobacco history on file. He reports that he does not drink alcohol or use illicit drugs.   ROS:  Please see the history of present illness.   He has significant problems with panic attacks.   All other systems reviewed and negative.   PHYSICAL EXAM: VS:  BP 130/84  Pulse 58  Ht 5\' 9"  (1.753 m)  Wt 194 lb 12.8 oz (88.361 kg)  BMI 28.75 kg/m2 Well nourished, well developed, in no acute distress HEENT: normal Neck: no JVD Vascular: No carotid bruits Cardiac:  normal S1, S2; RRR; no murmur Lungs:  clear to auscultation bilaterally, no wheezing, rhonchi or rales Abd: soft, nontender, no hepatomegaly Ext: no edema Skin: warm and dry Neuro:  CNs 2-12 intact, no focal abnormalities noted  EKG:  NSR, HR 58, LAD, poor R wave progression, T-wave inversions in V5-V6, similar  to prior tracings without significant change     ASSESSMENT AND PLAN:  1. Surgical Clearance: He does not have any unstable cardiac conditions. He had a low risk Myoview scan 3/13. He is to undergo a low risk dental procedure. He does not require any further cardiac workup prior to his noncardiac procedure. He should be at acceptable risk. If possible, he should remain on aspirin. He should also remain on his metoprolol throughout the perioperative period to reduce the chances of cardiovascular complications. 2. Coronary Artery Disease: Stable. No angina. Continue aspirin and statin. 3. Hypertension: Controlled. Continue current therapy. 4. Hyperlipidemia: He prefers to have this followed with his PCP. 5. Tobacco abuse: We discussed the  importance of cessation and different strategies for quitting.    6. Disposition: Followup with Dr. Jens Som in 6 months  Signed, Tereso Newcomer, PA-C  10:40 AM 06/17/2012

## 2012-06-17 NOTE — Patient Instructions (Addendum)
Your physician recommends that you continue on your current medications as directed. Please refer to the Current Medication list given to you today.  Your physician wants you to follow-up in: 6 months. You will receive a reminder letter in the mail two months in advance. If you don't receive a letter, please call our office to schedule the follow-up appointment.  Please call office at [phone] within the next 2 weeks if you are having labs drawn at this office.

## 2012-10-16 ENCOUNTER — Other Ambulatory Visit: Payer: Self-pay | Admitting: Cardiology

## 2012-11-21 ENCOUNTER — Encounter (HOSPITAL_COMMUNITY): Payer: Self-pay | Admitting: Emergency Medicine

## 2012-11-21 ENCOUNTER — Emergency Department (HOSPITAL_COMMUNITY)
Admission: EM | Admit: 2012-11-21 | Discharge: 2012-11-21 | Disposition: A | Discharge status: Home / Self Care | Payer: Medicaid Other | Attending: Emergency Medicine | Admitting: Emergency Medicine

## 2012-11-21 DIAGNOSIS — Z8659 Personal history of other mental and behavioral disorders: Secondary | ICD-10-CM | POA: Insufficient documentation

## 2012-11-21 DIAGNOSIS — IMO0001 Reserved for inherently not codable concepts without codable children: Secondary | ICD-10-CM | POA: Insufficient documentation

## 2012-11-21 DIAGNOSIS — Z860100 Personal history of colon polyps, unspecified: Secondary | ICD-10-CM | POA: Insufficient documentation

## 2012-11-21 DIAGNOSIS — Z9861 Coronary angioplasty status: Secondary | ICD-10-CM | POA: Insufficient documentation

## 2012-11-21 DIAGNOSIS — F411 Generalized anxiety disorder: Secondary | ICD-10-CM | POA: Insufficient documentation

## 2012-11-21 DIAGNOSIS — Z79899 Other long term (current) drug therapy: Secondary | ICD-10-CM | POA: Insufficient documentation

## 2012-11-21 DIAGNOSIS — G8929 Other chronic pain: Secondary | ICD-10-CM | POA: Insufficient documentation

## 2012-11-21 DIAGNOSIS — E1140 Type 2 diabetes mellitus with diabetic neuropathy, unspecified: Secondary | ICD-10-CM

## 2012-11-21 DIAGNOSIS — F17200 Nicotine dependence, unspecified, uncomplicated: Secondary | ICD-10-CM | POA: Insufficient documentation

## 2012-11-21 DIAGNOSIS — L97909 Non-pressure chronic ulcer of unspecified part of unspecified lower leg with unspecified severity: Secondary | ICD-10-CM | POA: Insufficient documentation

## 2012-11-21 DIAGNOSIS — Z862 Personal history of diseases of the blood and blood-forming organs and certain disorders involving the immune mechanism: Secondary | ICD-10-CM | POA: Insufficient documentation

## 2012-11-21 DIAGNOSIS — I10 Essential (primary) hypertension: Secondary | ICD-10-CM | POA: Insufficient documentation

## 2012-11-21 DIAGNOSIS — E1149 Type 2 diabetes mellitus with other diabetic neurological complication: Secondary | ICD-10-CM | POA: Insufficient documentation

## 2012-11-21 DIAGNOSIS — Z8639 Personal history of other endocrine, nutritional and metabolic disease: Secondary | ICD-10-CM | POA: Insufficient documentation

## 2012-11-21 DIAGNOSIS — I2510 Atherosclerotic heart disease of native coronary artery without angina pectoris: Secondary | ICD-10-CM | POA: Insufficient documentation

## 2012-11-21 DIAGNOSIS — E1142 Type 2 diabetes mellitus with diabetic polyneuropathy: Secondary | ICD-10-CM | POA: Insufficient documentation

## 2012-11-21 MED ORDER — HYDROMORPHONE HCL PF 1 MG/ML IJ SOLN
1.0000 mg | Freq: Once | INTRAMUSCULAR | Status: AC
  Administered 2012-11-21: 1 mg via INTRAVENOUS
  Filled 2012-11-21: qty 1

## 2012-11-21 MED ORDER — OXYCODONE-ACETAMINOPHEN 10-325 MG PO TABS: 1.0000 | ORAL_TABLET | ORAL | Status: DC | PRN

## 2012-11-21 NOTE — ED Provider Notes (Signed)
CSN: [id number]     Arrival date & time 11/21/12  1711 History     First MD Initiated Contact with Patient 11/21/12 1754     Chief Complaint  Patient presents with  . Back Pain  . Leg Pain   (Consider location/radiation/quality/duration/timing/severity/associated sxs/prior Treatment) HPI Patient presents emergency department for pain control.  The patient, states, that he takes pain medicine, and was referred to pain management by his primary Dr. patient, states, that he has diabetic neuropathy, and ulcers on his legs.  Patient, states, that he has not slept in 2 nights due to his pain.  Patient, states, that he did not take any medications prior to arrival.  Patient denies chest pain, shortness of breath, nausea, vomiting, diarrhea, headache, blurred vision, weakness, numbness, dizziness, rash, fever, or syncope.  Patient, states, that nothing seems to make his condition, better or worse Past Medical History  Diagnosis Date  . Hypertension   . Diabetes mellitus   . Hyperlipidemia   . CAD (coronary artery disease)   . Anxiety   . Colon polyps   . Chronic back pain   . Panic attacks    Past Surgical History  Procedure Laterality Date  . Coronary angioplasty with stent placement    . Multiple extractions with alveoloplasty  12/10/2011    Procedure: MULTIPLE EXTRACION WITH ALVEOLOPLASTY;  Surgeon: Georgia Lopes, DDS;  Location: [facility] OR;  Service: Oral Surgery;  Laterality: N/A;  Right Maxillary Tuberosity Reduction; Right  Maxillary Buccal Exostosis; Bilateral Mandibular Tori   . Ear cyst excision  12/10/2011    Procedure: CYST REMOVAL;  Surgeon: Georgia Lopes, DDS;  Location: [facility] OR;  Service: Oral Surgery;  Laterality: Left;  Left Mandible Cyst Removal   Family History  Problem Relation Age of Onset  . Diabetes Father   . Kidney failure Father    History  Substance Use Topics  . Smoking status: Current Every Day Smoker -- 1.00 packs/day for 20 years  . Smokeless tobacco: Not on  file  . Alcohol Use: No    Review of Systems All other systems negative except as documented in the HPI. All pertinent positives and negatives as reviewed in the HPI. Allergies  Codeine; Gabapentin; Hydrocodone; and Lisinopril  Home Medications   Current Outpatient Rx  Name  Route  Sig  Dispense  Refill  . furosemide (LASIX) 20 MG tablet   Oral   Take 20 mg by mouth daily.         . metFORMIN (GLUCOPHAGE) 1000 MG tablet   Oral   Take 1,000 mg by mouth 2 (two) times daily with a meal.         . potassium chloride (KLOR-CON) 8 MEQ tablet   Oral   Take 8 mEq by mouth daily.         . saxagliptin HCl (ONGLYZA) 5 MG TABS tablet   Oral   Take 2.5 mg by mouth 2 (two) times daily.         . Vitamin D, Ergocalciferol, (DRISDOL) 50000 UNITS CAPS   Oral   Take 50,000 Units by mouth every 7 (seven) days. Tuesdays          BP 162/100  Pulse 68  Temp(Src) 97.1 F (36.2 C) (Oral)  Resp 16  SpO2 97% Physical Exam  Nursing note and vitals reviewed. Constitutional: He is oriented to person, place, and time. He appears well-developed and well-nourished. No distress.  HENT:  Head: Normocephalic and atraumatic.  Mouth/Throat: Oropharynx  is clear and moist.  Eyes: Pupils are equal, round, and reactive to light.  Neck: Normal range of motion. Neck supple.  Cardiovascular: Normal rate, regular rhythm and normal heart sounds.  Exam reveals no gallop and no friction rub.   No murmur heard. Pulmonary/Chest: Effort normal and breath sounds normal. No respiratory distress.  Neurological: He is alert and oriented to person, place, and time.  Skin: Skin is warm and dry.  Patient has several healing ulcers on his right anterior shin and several healed, ulcers in that area, along with the, left anterior shin    ED Course   Procedures (including critical care time)  Labs Reviewed  GLUCOSE, CAPILLARY - Abnormal; Notable for the following:    Glucose-Capillary 145 (*)    All  other components within normal limits   patient is here requesting pain control for here and at home.  Patient, states his pain management appointment, next week.  Patient is advised return here as needed.  The areas on his legs do not appear infected at this time  MDM    Carlyle Dolly, PA-C 11/21/12 1924

## 2012-11-21 NOTE — ED Notes (Signed)
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

## 2012-11-21 NOTE — ED Notes (Addendum)
Condition is made better by nothing. Pt states that he in "unable to walk or move or get comfortable for any extent of time" pt describes the pain "as stabbing" and states that he no longer has pain prescription  medication "because it was 30 days when I saw her (PMD)"

## 2012-11-22 NOTE — ED Provider Notes (Signed)
Medical screening examination/treatment/procedure(s) were performed by non-physician practitioner and as supervising physician I was immediately available for consultation/collaboration.    Cecilie Heidel L Nashid Pellum, MD 11/22/12 1114 

## 2012-12-09 ENCOUNTER — Emergency Department (HOSPITAL_COMMUNITY)
Admission: EM | Admit: 2012-12-09 | Discharge: 2012-12-09 | Disposition: A | Discharge status: Home / Self Care | Payer: Medicaid Other | Attending: Emergency Medicine | Admitting: Emergency Medicine

## 2012-12-09 ENCOUNTER — Encounter (HOSPITAL_COMMUNITY): Payer: Self-pay | Admitting: *Deleted

## 2012-12-09 DIAGNOSIS — E1149 Type 2 diabetes mellitus with other diabetic neurological complication: Secondary | ICD-10-CM | POA: Insufficient documentation

## 2012-12-09 DIAGNOSIS — E785 Hyperlipidemia, unspecified: Secondary | ICD-10-CM | POA: Insufficient documentation

## 2012-12-09 DIAGNOSIS — G629 Polyneuropathy, unspecified: Secondary | ICD-10-CM

## 2012-12-09 DIAGNOSIS — M545 Low back pain, unspecified: Secondary | ICD-10-CM | POA: Insufficient documentation

## 2012-12-09 DIAGNOSIS — Z860100 Personal history of colon polyps, unspecified: Secondary | ICD-10-CM | POA: Insufficient documentation

## 2012-12-09 DIAGNOSIS — Z8659 Personal history of other mental and behavioral disorders: Secondary | ICD-10-CM | POA: Insufficient documentation

## 2012-12-09 DIAGNOSIS — R209 Unspecified disturbances of skin sensation: Secondary | ICD-10-CM | POA: Insufficient documentation

## 2012-12-09 DIAGNOSIS — G909 Disorder of the autonomic nervous system, unspecified: Secondary | ICD-10-CM | POA: Insufficient documentation

## 2012-12-09 DIAGNOSIS — I2510 Atherosclerotic heart disease of native coronary artery without angina pectoris: Secondary | ICD-10-CM | POA: Insufficient documentation

## 2012-12-09 DIAGNOSIS — Z79899 Other long term (current) drug therapy: Secondary | ICD-10-CM | POA: Insufficient documentation

## 2012-12-09 DIAGNOSIS — IMO0001 Reserved for inherently not codable concepts without codable children: Secondary | ICD-10-CM | POA: Insufficient documentation

## 2012-12-09 DIAGNOSIS — I10 Essential (primary) hypertension: Secondary | ICD-10-CM | POA: Insufficient documentation

## 2012-12-09 DIAGNOSIS — M549 Dorsalgia, unspecified: Secondary | ICD-10-CM

## 2012-12-09 DIAGNOSIS — F17200 Nicotine dependence, unspecified, uncomplicated: Secondary | ICD-10-CM | POA: Insufficient documentation

## 2012-12-09 DIAGNOSIS — G8929 Other chronic pain: Secondary | ICD-10-CM | POA: Insufficient documentation

## 2012-12-09 DIAGNOSIS — Z872 Personal history of diseases of the skin and subcutaneous tissue: Secondary | ICD-10-CM | POA: Insufficient documentation

## 2012-12-09 MED ORDER — TRAMADOL HCL 50 MG PO TABS
50.0000 mg | ORAL_TABLET | Freq: Once | ORAL | Status: AC
  Administered 2012-12-09: 50 mg via ORAL
  Filled 2012-12-09: qty 1

## 2012-12-09 MED ORDER — OXYCODONE-ACETAMINOPHEN 5-325 MG PO TABS: 1.0000 | ORAL_TABLET | Freq: Four times a day (QID) | ORAL | Status: DC | PRN

## 2012-12-09 NOTE — ED Provider Notes (Signed)
CSN: [id number]     Arrival date & time 12/09/12  1118 History     First MD Initiated Contact with Patient 12/09/12 1240     No chief complaint on file.  (Consider location/radiation/quality/duration/timing/severity/associated sxs/prior Treatment) HPI 52 year old male with DM w/ neuropathy, HTN, and chronic pain presents with lower extremity skin lesions and back, hip and lower extremity pain.  Patient reports that he has had the skin lesions and pain for "months."  However, the skin lesions and pain have been worsening over the past 3 days - 1 week.  He reports that the referral to pain management has not been completed and he has not seen a pain specialist yet.   He endorses severe 9/10 lower back pain which radiates to the hips and legs.  Lower extremity pain described as numbness/tingling, also severe.  No incontinence or saddle anesthesia.  He denies fevers/chills, nausea, vomiting.   Past Medical History  Diagnosis Date  . Hypertension   . Diabetes mellitus   . Hyperlipidemia   . CAD (coronary artery disease)   . Anxiety   . Colon polyps   . Chronic back pain   . Panic attacks    Past Surgical History  Procedure Laterality Date  . Coronary angioplasty with stent placement    . Multiple extractions with alveoloplasty  12/10/2011    Procedure: MULTIPLE EXTRACION WITH ALVEOLOPLASTY;  Surgeon: Georgia Lopes, DDS;  Location: [facility] OR;  Service: Oral Surgery;  Laterality: N/A;  Right Maxillary Tuberosity Reduction; Right  Maxillary Buccal Exostosis; Bilateral Mandibular Tori   . Ear cyst excision  12/10/2011    Procedure: CYST REMOVAL;  Surgeon: Georgia Lopes, DDS;  Location: [facility] OR;  Service: Oral Surgery;  Laterality: Left;  Left Mandible Cyst Removal   Family History  Problem Relation Age of Onset  . Diabetes Father   . Kidney failure Father    History  Substance Use Topics  . Smoking status: Current Every Day Smoker -- 1.00 packs/day for 20 years  . Smokeless tobacco: Not on  file  . Alcohol Use: No    Review of Systems  Constitutional: Negative for fever and chills.  Respiratory: Negative for shortness of breath.   Cardiovascular: Negative for chest pain.  Gastrointestinal: Negative for nausea, vomiting and abdominal pain.  Genitourinary: Negative for difficulty urinating.  Musculoskeletal: Positive for myalgias and back pain.  Skin: Positive for wound.  Neurological: Positive for numbness.   Allergies  Codeine; Gabapentin; Simvastatin; Hydrocodone; and Lisinopril  Home Medications   Current Outpatient Rx  Name  Route  Sig  Dispense  Refill  . furosemide (LASIX) 20 MG tablet   Oral   Take 20 mg by mouth daily.         . metFORMIN (GLUCOPHAGE) 1000 MG tablet   Oral   Take 1,000 mg by mouth 2 (two) times daily with a meal.         . metoprolol succinate (TOPROL-XL) 50 MG 24 hr tablet   Oral   Take 50 mg by mouth daily. Take with or immediately following a meal.         . oxyCODONE-acetaminophen (PERCOCET) 10-325 MG per tablet   Oral   Take 0.5 tablets by mouth every 4 (four) hours as needed for pain.         . potassium chloride (KLOR-CON) 8 MEQ tablet   Oral   Take 8 mEq by mouth daily.         [name] Kitchen  saxagliptin HCl (ONGLYZA) 5 MG TABS tablet   Oral   Take 2.5 mg by mouth 2 (two) times daily.         . Vitamin D, Ergocalciferol, (DRISDOL) 50000 UNITS CAPS   Oral   Take 50,000 Units by mouth every 7 (seven) days. Tuesdays          BP 152/64  Pulse 62  Temp(Src) 97.6 F (36.4 C) (Oral)  Resp 17  SpO2 99% Physical Exam  Constitutional: He is oriented to person, place, and time. He appears well-developed and well-nourished.  HENT:  Head: Normocephalic and atraumatic.  Eyes: No scleral icterus.  Cardiovascular: Normal rate and regular rhythm.  Exam reveals no friction rub.   No murmur heard. Pulmonary/Chest: Effort normal and breath sounds normal. He has no wheezes. He has no rales.  Abdominal: Soft. He exhibits no  distension. There is no tenderness.  Musculoskeletal: He exhibits tenderness.  LE tender to minimal palpitation. No appreciable edema. Anterior and posterior skin ulcerations in various stages of healing noted bilaterally.  No drainage or erythema to suggest infection. 2+ DP and PT pulses bilaterally.  Neurological: He is alert and oriented to person, place, and time.  Skin: Skin is warm.    ED Course   Procedures (including critical care time)  Labs Reviewed - No data to display No results found. 1. Back pain   2. Peripheral neuropathy     MDM  52 year old male with DM w/ neuropathy, HTN, and chronic pain presents with lower extremity skin lesions and back, hip and lower extremity pain. - Skin lesions/ulcerations do not appear infected.  Patient needs to be seen by Dermatology. - No red flags regarding back pain.  Will discharge patient with Percocet x 15 (he is allergic to Hydrocodone).  Information given regarding dermatologist and pain management.  - Patient clinically stable for discharge.   Tommie Sams, DO 12/09/12 1339

## 2012-12-09 NOTE — ED Notes (Signed)
Pt is having a flare up of diabetic neuropathy and is reporting ulcers all over legs, mid and lower back pain.  Pt states condition is worsening.

## 2012-12-09 NOTE — ED Notes (Signed)
Pt discharged.Vital signs stable and GCS 15 

## 2012-12-10 NOTE — ED Provider Notes (Signed)
I saw and evaluated the patient, reviewed the resident's note and I agree with the findings and plan.  Pertinent History: chronic neuropathy, on chronic pain meds, lesions on legs intermittent and tender today Pertinent Exam findings: scattered superficial ulcers on bilateral LE's, anterior and posterior, no fevers, swelling or d/c.  Impression:  Lower extremity Ulcers, Peripheral Neuropathy    Vida Roller, MD 12/10/12 1044

## 2012-12-19 ENCOUNTER — Encounter: Payer: Self-pay | Admitting: Cardiology

## 2012-12-19 ENCOUNTER — Ambulatory Visit (INDEPENDENT_AMBULATORY_CARE_PROVIDER_SITE_OTHER): Payer: Medicaid Other | Admitting: Cardiology

## 2012-12-19 VITALS — BP 145/79 | HR 73 | Ht 69.0 in | Wt 192.0 lb

## 2012-12-19 DIAGNOSIS — I2510 Atherosclerotic heart disease of native coronary artery without angina pectoris: Secondary | ICD-10-CM

## 2012-12-19 MED ORDER — VALSARTAN 80 MG PO TABS: 80.0000 mg | ORAL_TABLET | Freq: Every day | ORAL | Status: DC

## 2012-12-19 MED ORDER — ATORVASTATIN CALCIUM 10 MG PO TABS: 10.0000 mg | ORAL_TABLET | Freq: Every day | ORAL | Status: DC

## 2012-12-19 MED ORDER — ASPIRIN EC 81 MG PO TBEC: 81.0000 mg | DELAYED_RELEASE_TABLET | Freq: Every day | ORAL | Status: DC

## 2012-12-19 NOTE — Assessment & Plan Note (Signed)
Patient counseled on discontinuing. 

## 2012-12-19 NOTE — Assessment & Plan Note (Signed)
We will try Lipitor 10 mg daily. Check lipids, CK and liver in 6 weeks.

## 2012-12-19 NOTE — Patient Instructions (Signed)
Your physician wants you to follow-up in: ONE YEAR WITH DR Shelda Pal will receive a reminder letter in the mail two months in advance. If you don't receive a letter, please call our office to schedule the follow-up appointment.   START DIOVAN 80 MG ONCE DAILY  START LIPITOR 10 MG ONCE DAILY  Your physician recommends that you return for lab work in: ONE WEEK AND IN 6 WEEKS WITH PRIMARY CARE

## 2012-12-19 NOTE — Progress Notes (Signed)
HPI: fu coronary artery disease. Last cardiac catheterization in September of 2008 showed a 20% left main. There was a 50% LAD at the takeoff of the first diagonal. There was a 70% mid LAD. The first diagonal had a 90% tubular lesion. There was a 30-40% circumflex, mid circumflex and OM1 stent patent. The right coronary was subtotally occluded. There were left to right collaterals and the EF was 60. Medical therapy recommended. He has had problems with noncompliance with FU. Patient had a myoview in [place] in March of 2013 that showed an EF of 29%; no ischemia noted. There was a fixed defect involving the apical portion of the inferior wall, anterior wall and lateral wall. Echo in May of 2013 revealed an EF of 60-65 with hypokinesis of the distal anteroseptal and apical myocardium, mild LAE. Since he was last seen, he denies dyspnea, chest pain or syncope.   Current Outpatient Prescriptions  Medication Sig Dispense Refill  . furosemide (LASIX) 20 MG tablet Take 20 mg by mouth daily.      [name] Kitchen HYDROcodone-acetaminophen (NORCO/VICODIN) 5-325 MG per tablet Take 1 tablet by mouth every 6 (six) hours as needed for pain.      . metoprolol succinate (TOPROL-XL) 50 MG 24 hr tablet Take 50 mg by mouth daily. Take with or immediately following a meal.      . oxyCODONE-acetaminophen (ROXICET) 5-325 MG per tablet Take 1 tablet by mouth every 6 (six) hours as needed for pain.  15 tablet  0  . potassium chloride (KLOR-CON) 8 MEQ tablet Take 8 mEq by mouth daily.      . Saxagliptin-Metformin (KOMBIGLYZE XR) 2.08-998 MG TB24 Take 1 tablet by mouth 2 (two) times daily.      . Vitamin D, Ergocalciferol, (DRISDOL) 50000 UNITS CAPS Take 50,000 Units by mouth every 7 (seven) days. Tuesdays       No current facility-administered medications for this visit.     Past Medical History  Diagnosis Date  . Hypertension   . Diabetes mellitus   . Hyperlipidemia   . CAD (coronary artery disease)   . Anxiety   . Colon  polyps   . Chronic back pain   . Panic attacks     Past Surgical History  Procedure Laterality Date  . Coronary angioplasty with stent placement    . Multiple extractions with alveoloplasty  12/10/2011    Procedure: MULTIPLE EXTRACION WITH ALVEOLOPLASTY;  Surgeon: Georgia Lopes, DDS;  Location: [facility] OR;  Service: Oral Surgery;  Laterality: N/A;  Right Maxillary Tuberosity Reduction; Right  Maxillary Buccal Exostosis; Bilateral Mandibular Tori   . Ear cyst excision  12/10/2011    Procedure: CYST REMOVAL;  Surgeon: Georgia Lopes, DDS;  Location: [facility] OR;  Service: Oral Surgery;  Laterality: Left;  Left Mandible Cyst Removal    History   Social History  . Marital Status: Legally Separated    Spouse Name: N/A    Number of Children: N/A  . Years of Education: N/A   Occupational History  .      Unemployed   Social History Main Topics  . Smoking status: Current Every Day Smoker -- 1.00 packs/day for 20 years  . Smokeless tobacco: Not on file  . Alcohol Use: No  . Drug Use: No  . Sexual Activity: Not on file   Other Topics Concern  . Not on file   Social History Narrative  . No narrative on file    ROS: some problems with leg pain  but no fevers or chills, productive cough, hemoptysis, dysphasia, odynophagia, melena, hematochezia, dysuria, hematuria, rash, seizure activity, orthopnea, PND, pedal edema, claudication. Remaining systems are negative.  Physical Exam: Well-developed well-nourished in no acute distress.  Skin is warm and dry.  HEENT is normal.  Neck is supple.  Chest is clear to auscultation with normal expansion.  Cardiovascular exam is regular rate and rhythm.  Abdominal exam nontender or distended. No masses palpated. Extremities show no edema. neuro grossly intact  ECG sinus rhythm at a rate of 64. 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 Lateral T-wave inversion.

## 2012-12-19 NOTE — Assessment & Plan Note (Signed)
Blood pressure mildly elevated. He apparently had rash with lisinopril. I would like for him to be on an ARB given his history of diabetes mellitus. Begin Diovan 80 mg daily. Check potassium and renal function in one week.

## 2013-03-25 ENCOUNTER — Other Ambulatory Visit (HOSPITAL_COMMUNITY): Payer: Self-pay | Admitting: Cardiology

## 2013-04-16 ENCOUNTER — Emergency Department (HOSPITAL_COMMUNITY)
Admission: EM | Admit: 2013-04-16 | Discharge: 2013-04-16 | Disposition: A | Discharge status: Home / Self Care | Payer: Medicaid Other | Attending: Emergency Medicine | Admitting: Emergency Medicine

## 2013-04-16 ENCOUNTER — Encounter (HOSPITAL_COMMUNITY): Payer: Self-pay | Admitting: Emergency Medicine

## 2013-04-16 DIAGNOSIS — S339XXA Sprain of unspecified parts of lumbar spine and pelvis, initial encounter: Secondary | ICD-10-CM | POA: Insufficient documentation

## 2013-04-16 DIAGNOSIS — Y9389 Activity, other specified: Secondary | ICD-10-CM | POA: Insufficient documentation

## 2013-04-16 DIAGNOSIS — M549 Dorsalgia, unspecified: Secondary | ICD-10-CM

## 2013-04-16 DIAGNOSIS — X500XXA Overexertion from strenuous movement or load, initial encounter: Secondary | ICD-10-CM | POA: Insufficient documentation

## 2013-04-16 DIAGNOSIS — S39012A Strain of muscle, fascia and tendon of lower back, initial encounter: Secondary | ICD-10-CM

## 2013-04-16 DIAGNOSIS — G8929 Other chronic pain: Secondary | ICD-10-CM | POA: Insufficient documentation

## 2013-04-16 DIAGNOSIS — Z7982 Long term (current) use of aspirin: Secondary | ICD-10-CM | POA: Insufficient documentation

## 2013-04-16 DIAGNOSIS — Y929 Unspecified place or not applicable: Secondary | ICD-10-CM | POA: Insufficient documentation

## 2013-04-16 DIAGNOSIS — Z860100 Personal history of colon polyps, unspecified: Secondary | ICD-10-CM | POA: Insufficient documentation

## 2013-04-16 DIAGNOSIS — F17200 Nicotine dependence, unspecified, uncomplicated: Secondary | ICD-10-CM | POA: Insufficient documentation

## 2013-04-16 DIAGNOSIS — F411 Generalized anxiety disorder: Secondary | ICD-10-CM | POA: Insufficient documentation

## 2013-04-16 DIAGNOSIS — I2510 Atherosclerotic heart disease of native coronary artery without angina pectoris: Secondary | ICD-10-CM | POA: Insufficient documentation

## 2013-04-16 DIAGNOSIS — Z9861 Coronary angioplasty status: Secondary | ICD-10-CM | POA: Insufficient documentation

## 2013-04-16 DIAGNOSIS — Z79899 Other long term (current) drug therapy: Secondary | ICD-10-CM | POA: Insufficient documentation

## 2013-04-16 DIAGNOSIS — I10 Essential (primary) hypertension: Secondary | ICD-10-CM | POA: Insufficient documentation

## 2013-04-16 DIAGNOSIS — E119 Type 2 diabetes mellitus without complications: Secondary | ICD-10-CM | POA: Insufficient documentation

## 2013-04-16 MED ORDER — OXYCODONE-ACETAMINOPHEN 5-325 MG PO TABS: 1.0000 | ORAL_TABLET | Freq: Four times a day (QID) | ORAL | Status: DC | PRN

## 2013-04-16 MED ORDER — DIAZEPAM 5 MG PO TABS: 5.0000 mg | ORAL_TABLET | Freq: Two times a day (BID) | ORAL | Status: DC

## 2013-04-16 MED ORDER — DIAZEPAM 5 MG PO TABS
5.0000 mg | ORAL_TABLET | Freq: Once | ORAL | Status: AC
  Administered 2013-04-16: 5 mg via ORAL
  Filled 2013-04-16: qty 1

## 2013-04-16 MED ORDER — OXYCODONE-ACETAMINOPHEN 5-325 MG PO TABS
2.0000 | ORAL_TABLET | Freq: Once | ORAL | Status: AC
  Administered 2013-04-16: 2 via ORAL
  Filled 2013-04-16: qty 2

## 2013-04-16 NOTE — ED Provider Notes (Signed)
CSN: [id number]     Arrival date & time 04/16/13  0955 History  This chart was scribed for non-physician practitioner, Johnnette Gourd, PA-C working with Glynn Octave, MD by Greggory Stallion, ED scribe. This patient was seen in room TR08C/TR08C and the patient's care was started at 10:48 AM.   Chief Complaint  Patient presents with  . Back Pain   The history is provided by the patient. No language interpreter was used.   HPI Comments: [name] is a 52 y.o. male who presents to the Emergency Department complaining of sudden onset, constant lower, right back pain that started yesterday after lifting furniture. Rates the pain 8/10. He is currently being treated by Dr. Petra Kuba in [facility] for a different back injury and has a MRI on January 29. Pt has taken aspirin and ibuprofen with little relief. Bearing weight and certain movements worsen the pain. Denies urinary or bowel incontinence, numbness and tingling in lower extremities.   Past Medical History  Diagnosis Date  . Hypertension   . Diabetes mellitus   . Hyperlipidemia   . CAD (coronary artery disease)   . Anxiety   . Colon polyps   . Chronic back pain   . Panic attacks    Past Surgical History  Procedure Laterality Date  . Coronary angioplasty with stent placement    . Multiple extractions with alveoloplasty  12/10/2011    Procedure: MULTIPLE EXTRACION WITH ALVEOLOPLASTY;  Surgeon: Georgia Lopes, DDS;  Location: [facility] OR;  Service: Oral Surgery;  Laterality: N/A;  Right Maxillary Tuberosity Reduction; Right  Maxillary Buccal Exostosis; Bilateral Mandibular Tori   . Ear cyst excision  12/10/2011    Procedure: CYST REMOVAL;  Surgeon: Georgia Lopes, DDS;  Location: [facility] OR;  Service: Oral Surgery;  Laterality: Left;  Left Mandible Cyst Removal   Family History  Problem Relation Age of Onset  . Diabetes Father   . Kidney failure Father    History  Substance Use Topics  . Smoking status: Current Every Day Smoker -- 1.00  packs/day for 20 years  . Smokeless tobacco: Not on file  . Alcohol Use: No    Review of Systems  Genitourinary:       Negative for bowel or bladder incontinence.   Musculoskeletal: Positive for back pain and myalgias.  Neurological: Negative for numbness.  All other systems reviewed and are negative.    Allergies  Codeine; Gabapentin; Simvastatin; Hydrocodone; and Lisinopril  Home Medications   Current Outpatient Rx  Name  Route  Sig  Dispense  Refill  . aspirin 325 MG tablet   Oral   Take 975 mg by mouth 3 (three) times daily as needed for mild pain.         . furosemide (LASIX) 20 MG tablet   Oral   Take 20 mg by mouth daily as needed for fluid.          [name] Kitchen ibuprofen (ADVIL,MOTRIN) 200 MG tablet   Oral   Take 600 mg by mouth 3 (three) times daily as needed for moderate pain.         . metoprolol succinate (TOPROL-XL) 50 MG 24 hr tablet   Oral   Take 50 mg by mouth daily. Take with or immediately following a meal.         . potassium chloride (KLOR-CON) 8 MEQ tablet   Oral   Take 8 mEq by mouth daily as needed (when taking lasix).          [name] Kitchen  Saxagliptin-Metformin (KOMBIGLYZE XR) 2.08-998 MG TB24   Oral   Take 1 tablet by mouth 2 (two) times daily.         . diazepam (VALIUM) 5 MG tablet   Oral   Take 1 tablet (5 mg total) by mouth 2 (two) times daily.   10 tablet   0   . oxyCODONE-acetaminophen (PERCOCET) 5-325 MG per tablet   Oral   Take 1-2 tablets by mouth every 6 (six) hours as needed for severe pain.   10 tablet   0    BP 157/99  Pulse 63  Temp(Src) 98.1 F (36.7 C) (Oral)  Resp 15  SpO2 98%  Physical Exam  Nursing note and vitals reviewed. Constitutional: He is oriented to person, place, and time. He appears well-developed and well-nourished. No distress.  HENT:  Head: Normocephalic and atraumatic.  Eyes: Conjunctivae and EOM are normal.  Neck: Normal range of motion. Neck supple.  Cardiovascular: Normal rate, regular rhythm,  normal heart sounds and intact distal pulses.   Pulmonary/Chest: Effort normal and breath sounds normal.  Musculoskeletal: Normal range of motion. He exhibits no edema.  Tender to palpation on right para lumbar and SI joint. No spinous process tenderness.   Neurological: He is alert and oriented to person, place, and time.  Strength 5/5 and equal in bilateral lower extremities. Sensation intact. Normal gait.   Skin: Skin is warm and dry.  Psychiatric: He has a normal mood and affect. His behavior is normal.    ED Course  Procedures (including critical care time)  DIAGNOSTIC STUDIES: Oxygen Saturation is 98% on RA, normal by my interpretation.    COORDINATION OF CARE: 10:51 AM-Discussed treatment plan which includes a muscle relaxer and pain medication with pt at bedside and pt agreed to plan.   Labs Review Labs Reviewed - No data to display Imaging Review No results found.  EKG Interpretation   None       MDM   1. Back pain   2. Lumbosacral strain, initial encounter    No red flags concerning patient's back pain. No s/s of central cord compression or cauda equina. Lower extremities are neurovascularly intact and patient is ambulating without difficulty. Tx with percocet, valium, rest, ice/heat. Return precautions given. Patient states understanding of treatment care plan and is agreeable.    I personally performed the services described in this documentation, which was scribed in my presence. The recorded information has been reviewed and is accurate.  Trevor Mace, PA-C 04/16/13 1056

## 2013-04-16 NOTE — ED Notes (Signed)
Pt states he is being treated currently for back injury by a neurologist in [place]. Has upcoming MRI in January. He was lifting furniture yesterday am and hes having severe back pain since, unrelieved by OTC pain meds. Ambulatory, mae

## 2013-04-17 NOTE — ED Provider Notes (Signed)
Medical screening examination/treatment/procedure(s) were performed by non-physician practitioner and as supervising physician I was immediately available for consultation/collaboration.  EKG Interpretation   None         Ranald Alessio, MD 04/17/13 1001 

## 2013-06-16 ENCOUNTER — Emergency Department (HOSPITAL_COMMUNITY)
Admission: EM | Admit: 2013-06-16 | Discharge: 2013-06-16 | Disposition: A | Discharge status: Home / Self Care | Payer: Medicaid Other | Attending: Emergency Medicine | Admitting: Emergency Medicine

## 2013-06-16 ENCOUNTER — Emergency Department (HOSPITAL_COMMUNITY): Payer: Medicaid Other

## 2013-06-16 ENCOUNTER — Encounter (HOSPITAL_COMMUNITY): Payer: Self-pay | Admitting: Emergency Medicine

## 2013-06-16 DIAGNOSIS — R0789 Other chest pain: Secondary | ICD-10-CM | POA: Insufficient documentation

## 2013-06-16 DIAGNOSIS — F17200 Nicotine dependence, unspecified, uncomplicated: Secondary | ICD-10-CM | POA: Insufficient documentation

## 2013-06-16 DIAGNOSIS — Z860100 Personal history of colon polyps, unspecified: Secondary | ICD-10-CM | POA: Insufficient documentation

## 2013-06-16 DIAGNOSIS — Z79899 Other long term (current) drug therapy: Secondary | ICD-10-CM | POA: Insufficient documentation

## 2013-06-16 DIAGNOSIS — Z8659 Personal history of other mental and behavioral disorders: Secondary | ICD-10-CM | POA: Insufficient documentation

## 2013-06-16 DIAGNOSIS — M549 Dorsalgia, unspecified: Secondary | ICD-10-CM | POA: Insufficient documentation

## 2013-06-16 DIAGNOSIS — F411 Generalized anxiety disorder: Secondary | ICD-10-CM | POA: Insufficient documentation

## 2013-06-16 DIAGNOSIS — Z7982 Long term (current) use of aspirin: Secondary | ICD-10-CM | POA: Insufficient documentation

## 2013-06-16 DIAGNOSIS — E119 Type 2 diabetes mellitus without complications: Secondary | ICD-10-CM | POA: Insufficient documentation

## 2013-06-16 DIAGNOSIS — I10 Essential (primary) hypertension: Secondary | ICD-10-CM | POA: Insufficient documentation

## 2013-06-16 DIAGNOSIS — I2510 Atherosclerotic heart disease of native coronary artery without angina pectoris: Secondary | ICD-10-CM | POA: Insufficient documentation

## 2013-06-16 DIAGNOSIS — R079 Chest pain, unspecified: Secondary | ICD-10-CM

## 2013-06-16 DIAGNOSIS — Z9861 Coronary angioplasty status: Secondary | ICD-10-CM | POA: Insufficient documentation

## 2013-06-16 DIAGNOSIS — G8929 Other chronic pain: Secondary | ICD-10-CM | POA: Insufficient documentation

## 2013-06-16 LAB — CBC
HCT: 46.2 % (ref 39.0–52.0)
Hemoglobin: 16.6 g/dL (ref 13.0–17.0)
MCH: 30.7 pg (ref 26.0–34.0)
MCHC: 35.9 g/dL (ref 30.0–36.0)
MCV: 85.6 fL (ref 78.0–100.0)
PLATELETS: 268 10*3/uL (ref 150–400)
RBC: 5.4 MIL/uL (ref 4.22–5.81)
RDW: 13.4 % (ref 11.5–15.5)
WBC: 13.6 10*3/uL — AB (ref 4.0–10.5)

## 2013-06-16 LAB — BASIC METABOLIC PANEL
BUN: 10 mg/dL (ref 6–23)
CHLORIDE: 101 meq/L (ref 96–112)
CO2: 20 meq/L (ref 19–32)
CREATININE: 0.66 mg/dL (ref 0.50–1.35)
Calcium: 9.7 mg/dL (ref 8.4–10.5)
GFR calc Af Amer: >90 mL/min (ref >90)
GFR calc non Af Amer: >90 mL/min (ref >90)
GLUCOSE: 124 mg/dL — AB (ref 70–99)
Potassium: 4.2 mEq/L (ref 3.7–5.3)
Sodium: 139 mEq/L (ref 137–147)

## 2013-06-16 LAB — I-STAT TROPONIN, ED
TROPONIN I, POC: 0 ng/mL (ref 0.00–0.08)
Troponin i, poc: 0.01 ng/mL (ref 0.00–0.08)

## 2013-06-16 MED ORDER — DIAZEPAM 5 MG/ML IJ SOLN
5.0000 mg | Freq: Once | INTRAMUSCULAR | Status: AC
  Administered 2013-06-16: 5 mg via INTRAVENOUS
  Filled 2013-06-16: qty 2

## 2013-06-16 MED ORDER — OXYCODONE-ACETAMINOPHEN 5-325 MG PO TABS
2.0000 | ORAL_TABLET | Freq: Once | ORAL | Status: AC
  Administered 2013-06-16: 2 via ORAL
  Filled 2013-06-16: qty 2

## 2013-06-16 MED ORDER — DIPHENHYDRAMINE HCL 50 MG/ML IJ SOLN
25.0000 mg | Freq: Once | INTRAMUSCULAR | Status: AC
  Administered 2013-06-16: 25 mg via INTRAVENOUS
  Filled 2013-06-16: qty 1

## 2013-06-16 MED ORDER — OXYCODONE-ACETAMINOPHEN 5-325 MG PO TABS: 2.0000 | ORAL_TABLET | Freq: Four times a day (QID) | ORAL | Status: DC | PRN

## 2013-06-16 NOTE — ED Notes (Signed)
Pt arrives with anxious appearance stating he's having intermittent substernal chest pains radiating to his back. Also reports he has chronic pain to legs and back. Has pain clinic Appt on Friday. Pt alert, oriented x4.

## 2013-06-16 NOTE — Discharge Instructions (Signed)
Take percocet as needed for pain. Do NOT drive with it.   Follow up with your doctor.   Return to ER if you have severe pain, worse chest pain, shortness of breath.

## 2013-06-16 NOTE — ED Provider Notes (Signed)
CSN: [id number]     Arrival date & time 06/16/13  8416 History   First MD Initiated Contact with Patient 06/16/13 [phone]     Chief Complaint  Patient presents with  . Chest Pain     (Consider location/radiation/quality/duration/timing/severity/associated sxs/prior Treatment) The history is provided by the patient.  ANUAR WALGREN is a 53 y.o. male hx of HTN, DM, HL, CAD with one stent here with worsening chronic back pain, chest pain. He notes chronic back pain getting worse. He is currently on , and all for pain and is scheduled to see a pain specialist on Friday. Since yesterday he states that the pain in his back became unbearable. He did fell several days ago on his left arm but did not fall on his back. Also had some intermittent substernal chest pain with no nausea. He wasn't sure if the chest pain was coming from his back pain or not. 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 He took 3 baby aspirins at home.    Past Medical History  Diagnosis Date  . Hypertension   . Diabetes mellitus   . Hyperlipidemia   . CAD (coronary artery disease)   . Anxiety   . Colon polyps   . Chronic back pain   . Panic attacks    Past Surgical History  Procedure Laterality Date  . Coronary angioplasty with stent placement    . Multiple extractions with alveoloplasty  12/10/2011    Procedure: MULTIPLE EXTRACION WITH ALVEOLOPLASTY;  Surgeon: Gae Bon, DDS;  Location: [facility];  Service: Oral Surgery;  Laterality: N/A;  Right Maxillary Tuberosity Reduction; Right  Maxillary Buccal Exostosis; Bilateral Mandibular Tori   . Ear cyst excision  12/10/2011    Procedure: CYST REMOVAL;  Surgeon: Gae Bon, DDS;  Location: [place];  Service: Oral Surgery;  Laterality: Left;  Left Mandible Cyst Removal   Family History  Problem Relation Age of Onset  . Diabetes Father   . Kidney failure Father    History  Substance Use Topics  . Smoking status:  Current Every Day Smoker -- 1.00 packs/day for 20 years  . Smokeless tobacco: Not on file  . Alcohol Use: No    Review of Systems  Cardiovascular: Positive for chest pain.  Musculoskeletal: Positive for back pain.  All other systems reviewed and are negative.      Allergies  Codeine; Gabapentin; Simvastatin; Hydrocodone; and Lisinopril  Home Medications   Current Outpatient Rx  Name  Route  Sig  Dispense  Refill  . aspirin 325 MG tablet   Oral   Take 975 mg by mouth 3 (three) times daily as needed for mild pain.         . metoprolol succinate (TOPROL-XL) 50 MG 24 hr tablet   Oral   Take 50 mg by mouth daily. Take with or immediately following a meal.         . Saxagliptin-Metformin (KOMBIGLYZE XR) 2.08-998 MG TB24   Oral   Take 1 tablet by mouth 2 (two) times daily.         . furosemide (LASIX) 20 MG tablet   Oral   Take 20 mg by mouth daily as needed for fluid.          . potassium chloride (KLOR-CON) 8 MEQ tablet   Oral   Take 8 mEq by mouth daily as needed (when taking lasix).  BP 143/80  Pulse 57  Temp(Src) 98 F (36.7 C) (Oral)  Resp 13  SpO2 98% Physical Exam  Nursing note and vitals reviewed. Constitutional: He is oriented to person, place, and time.  Anxious, slightly uncomfortable   HENT:  Head: Normocephalic.  Mouth/Throat: Oropharynx is clear and moist.  Eyes: Conjunctivae are normal. Pupils are equal, round, and reactive to light.  Neck: Normal range of motion. Neck supple.  Cardiovascular: Normal rate, regular rhythm and normal heart sounds.   Pulmonary/Chest: Effort normal and breath sounds normal. No respiratory distress. He has no wheezes. He has no rales. He exhibits no tenderness.  Abdominal: Soft. Bowel sounds are normal. He exhibits no distension. There is no tenderness. There is no rebound and no guarding.  Musculoskeletal: Normal range of motion.  Diffuse paralumbar tenderness and spasms, no midline tenderness    Neurological: He is alert and oriented to person, place, and time. No cranial nerve deficit. Coordination normal.  No saddle anesthesia, nl strength bilateral lower extremities   Skin: Skin is warm and dry.  Psychiatric: He has a normal mood and affect. His behavior is normal. Judgment and thought content normal.    ED Course  Procedures (including critical care time) Labs Review Labs Reviewed  CBC - Abnormal; Notable for the following:    WBC 13.6 (*)    All other components within normal limits  BASIC METABOLIC PANEL - Abnormal; Notable for the following:    Glucose, Bld 124 (*)    All other components within normal limits  I-STAT TROPOININ, ED  Randolm Idol, ED   Imaging Review Dg Chest Portable 1 View  06/16/2013   CLINICAL DATA:  Chest pain  EXAM: PORTABLE CHEST - 1 VIEW  COMPARISON:  February 23, 2013  FINDINGS: The heart size and mediastinal contours are stable. The heart size is enlarged. Both lungs are clear. The visualized skeletal structures are unremarkable.  IMPRESSION: No active cardiopulmonary disease.   Electronically Signed   By: Abelardo Diesel M.D.   On: 06/16/2013 10:32    EKG Interpretation    Date/Time:  Tuesday June 16 2013 09:42:26 EST Ventricular Rate:  77 PR Interval:  152 QRS Duration: 88 QT Interval:  400 QTC Calculation: 452 R Axis:   -67 Text Interpretation:  Sinus rhythm with sinus arrhythmia with Fusion complexes Left axis deviation Cannot rule out Inferior infarct , age undetermined Anterolateral infarct , age undetermined No significant change since last tracing Confirmed by [place]  MD, Laquinta Hazell [phone]) on 06/16/2013 9:56:33 AM            MDM   Final diagnoses:  None   SHIGERU LAMPERT is a 53 y.o. male here with chest pain, back pain. Chest pain atypical for ACS but given history of DM and MIs will get trop x 2. Back pain likely worsening chronic pain, no neuro deficits. Will give pain meds.    2:24 PM Trop neg x 2. Pain improved.  Has pain specialist appointment this Fri. Will give several percocets for now.   Wandra Arthurs, MD 06/16/13 1425

## 2013-06-16 NOTE — ED Notes (Signed)
Pt denies any chest pain.  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

## 2013-06-16 NOTE — ED Notes (Signed)
Dr. Yao at the bedside. 

## 2013-12-22 ENCOUNTER — Ambulatory Visit: Payer: Medicaid Other | Admitting: Cardiology

## 2014-02-09 ENCOUNTER — Ambulatory Visit: Payer: Medicaid Other | Admitting: Cardiology

## 2014-02-22 NOTE — Progress Notes (Signed)
HPI:  fu coronary artery disease. Last cardiac catheterization in September of 2008 showed a 20% left main. There was a 50% LAD at the takeoff of the first diagonal. There was a 70% mid LAD. The first diagonal had a 90% tubular lesion. There was a 30-40% circumflex, mid circumflex and OM1 stent patent. The right coronary was subtotally occluded. There were left to right collaterals and the EF was 60. Medical therapy recommended. He has had problems with noncompliance with FU. Patient had a myoview in [place] in March of 2013 that showed an EF of 29%; no ischemia noted. There was a fixed defect involving the apical portion of the inferior wall, anterior wall and lateral wall. Echo in May of 2013 revealed an EF of 60-65 with hypokinesis of the distal anteroseptal and apical myocardium, mild LAE. Since he was last seen, he denies dyspnea, chest pain or syncope.  Current Outpatient Prescriptions  Medication Sig Dispense Refill  . aspirin 325 MG tablet Take 975 mg by mouth 3 (three) times daily as needed for mild pain.    [name] Kitchen aspirin 81 MG tablet Take 81 mg by mouth daily.    . Exenatide ER (BYDUREON) 2 MG PEN Inject 2 mg into the skin daily.    . furosemide (LASIX) 20 MG tablet Take 20 mg by mouth daily as needed for fluid.     [name] Kitchen JANUMET 50-1000 MG per tablet Take 1 tablet by mouth 2 (two) times daily.  0  . metoprolol succinate (TOPROL-XL) 50 MG 24 hr tablet Take 50 mg by mouth daily. Take with or immediately following a meal.    . oxyCODONE-acetaminophen (PERCOCET) 5-325 MG per tablet Take 2 tablets by mouth every 6 (six) hours as needed. 15 tablet 0  . potassium chloride (KLOR-CON) 8 MEQ tablet Take 8 mEq by mouth daily as needed (when taking lasix).     . Vitamin D, Ergocalciferol, (DRISDOL) 50000 UNITS CAPS capsule Take 1 capsule by mouth once a week.  4   No current facility-administered medications for this visit.     Past Medical History  Diagnosis Date  . Hypertension   . Diabetes  mellitus   . Hyperlipidemia   . CAD (coronary artery disease)   . Anxiety   . Colon polyps   . Chronic back pain   . Panic attacks     Past Surgical History  Procedure Laterality Date  . Coronary angioplasty with stent placement    . Multiple extractions with alveoloplasty  12/10/2011    Procedure: MULTIPLE EXTRACION WITH ALVEOLOPLASTY;  Surgeon: Gae Bon, DDS;  Location: [place];  Service: Oral Surgery;  Laterality: N/A;  Right Maxillary Tuberosity Reduction; Right  Maxillary Buccal Exostosis; Bilateral Mandibular Tori   . Ear cyst excision  12/10/2011    Procedure: CYST REMOVAL;  Surgeon: Gae Bon, DDS;  Location: [place] [facility];  Service: Oral Surgery;  Laterality: Left;  Left Mandible Cyst Removal    History   Social History  . Marital Status: Legally Separated    Spouse Name: N/A    Number of Children: N/A  . Years of Education: N/A   Occupational History  .      Unemployed   Social History Main Topics  . Smoking status: Current Every Day Smoker -- 1.00 packs/day for 20 years  . Smokeless tobacco: Not on file  . Alcohol Use: No  . Drug Use: No  . Sexual Activity: Not on file   Other Topics  Concern  . Not on file   Social History Narrative    ROS: Chronic peripheral neuropathy and back pain but no fevers or chills, productive cough, hemoptysis, dysphasia, odynophagia, melena, hematochezia, dysuria, hematuria, rash, seizure activity, orthopnea, PND, pedal edema, claudication. Remaining systems are negative.  Physical Exam: Well-developed well-nourished in no acute distress.  Skin is warm and dry.  HEENT is normal.  Neck is supple.  Chest is clear to auscultation with normal expansion.  Cardiovascular exam is regular rate and rhythm.  Abdominal exam nontender or distended. No masses palpated. Extremities show no edema. neuro grossly intact  ECG Normal sinus rhythm with occasional PAC. 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 Lateral T-wave  inversion. No significant change compared to 06/16/2013.

## 2014-02-25 ENCOUNTER — Encounter: Payer: Self-pay | Admitting: Cardiology

## 2014-02-25 ENCOUNTER — Ambulatory Visit (INDEPENDENT_AMBULATORY_CARE_PROVIDER_SITE_OTHER): Payer: Medicaid Other | Admitting: Cardiology

## 2014-02-25 DIAGNOSIS — I10 Essential (primary) hypertension: Secondary | ICD-10-CM

## 2014-02-25 DIAGNOSIS — Z720 Tobacco use: Secondary | ICD-10-CM

## 2014-02-25 DIAGNOSIS — I2583 Coronary atherosclerosis due to lipid rich plaque: Principal | ICD-10-CM

## 2014-02-25 DIAGNOSIS — I2510 Atherosclerotic heart disease of native coronary artery without angina pectoris: Secondary | ICD-10-CM

## 2014-02-25 MED ORDER — LOSARTAN POTASSIUM 50 MG PO TABS: 50.0000 mg | ORAL_TABLET | Freq: Every day | ORAL | Status: DC

## 2014-02-25 MED ORDER — ATORVASTATIN CALCIUM 10 MG PO TABS: 10.0000 mg | ORAL_TABLET | Freq: Every day | ORAL | Status: DC

## 2014-02-25 NOTE — Assessment & Plan Note (Signed)
Patient counseled on discontinuing. 

## 2014-02-25 NOTE — Assessment & Plan Note (Signed)
Add Lipitor 10 mg daily. Check lipids and liver in 6 weeks.

## 2014-02-25 NOTE — Assessment & Plan Note (Signed)
Blood pressure mildly elevated. Also diabetic. Add Cozaar 50 mg daily. Check potassium and renal function in 1 week. Previous rash with lisinopril.

## 2014-02-25 NOTE — Assessment & Plan Note (Signed)
Continue aspirin. Add statin. 

## 2014-02-25 NOTE — Patient Instructions (Addendum)
Your physician wants you to follow-up in: [place] will receive a reminder letter in the mail two months in advance. If you don't receive a letter, please call our office to schedule the follow-up appointment.   START LOSARTAN 50 MG ONCE DAILY  Your physician recommends that you return for lab work in: [place]  START ATORVASTATIN 10 [place] physician recommends that you return for lab work in: 4 WEEKS-DO NOT EAT PRIOR TO LAB WORK

## 2014-11-16 ENCOUNTER — Other Ambulatory Visit: Payer: Self-pay | Admitting: *Deleted

## 2014-11-16 ENCOUNTER — Encounter: Payer: Self-pay | Admitting: Surgery

## 2014-11-16 DIAGNOSIS — I739 Peripheral vascular disease, unspecified: Secondary | ICD-10-CM

## 2014-12-02 ENCOUNTER — Inpatient Hospital Stay (HOSPITAL_COMMUNITY)
Admission: RE | Admit: 2014-12-02 | Discharge: 2014-12-02 | Disposition: A | Discharge status: Home / Self Care | Payer: Medicaid Other | Source: Ambulatory Visit | Attending: Surgery | Admitting: Surgery

## 2014-12-02 ENCOUNTER — Inpatient Hospital Stay (INDEPENDENT_AMBULATORY_CARE_PROVIDER_SITE_OTHER)
Admission: RE | Admit: 2014-12-02 | Discharge: 2014-12-02 | Disposition: A | Discharge status: Home / Self Care | Payer: Medicaid Other | Source: Ambulatory Visit | Attending: Surgery | Admitting: Surgery

## 2014-12-02 DIAGNOSIS — I739 Peripheral vascular disease, unspecified: Secondary | ICD-10-CM

## 2014-12-17 ENCOUNTER — Encounter: Payer: Self-pay | Admitting: Surgery

## 2014-12-20 ENCOUNTER — Ambulatory Visit (HOSPITAL_COMMUNITY)
Admission: RE | Admit: 2014-12-20 | Discharge: 2014-12-20 | Disposition: A | Discharge status: Home / Self Care | Payer: Medicaid Other | Source: Ambulatory Visit | Attending: Surgery | Admitting: Surgery

## 2014-12-20 ENCOUNTER — Ambulatory Visit (INDEPENDENT_AMBULATORY_CARE_PROVIDER_SITE_OTHER): Payer: Medicaid Other | Admitting: Surgery

## 2014-12-20 ENCOUNTER — Encounter: Payer: Self-pay | Admitting: Surgery

## 2014-12-20 ENCOUNTER — Other Ambulatory Visit: Payer: Self-pay

## 2014-12-20 VITALS — BP 128/70 | HR 80 | Temp 97.4°F | Ht 69.0 in | Wt 200.5 lb

## 2014-12-20 DIAGNOSIS — I739 Peripheral vascular disease, unspecified: Secondary | ICD-10-CM | POA: Diagnosis not present

## 2014-12-20 DIAGNOSIS — E785 Hyperlipidemia, unspecified: Secondary | ICD-10-CM | POA: Diagnosis not present

## 2014-12-20 DIAGNOSIS — E119 Type 2 diabetes mellitus without complications: Secondary | ICD-10-CM | POA: Insufficient documentation

## 2014-12-20 DIAGNOSIS — I10 Essential (primary) hypertension: Secondary | ICD-10-CM | POA: Diagnosis not present

## 2014-12-20 DIAGNOSIS — I70332 Atherosclerosis of unspecified type of bypass graft(s) of the right leg with ulceration of calf: Secondary | ICD-10-CM

## 2014-12-20 NOTE — Progress Notes (Signed)
Patient name: [name] MRN: [id number] DOB: [birth year] Sex: male  Referred by: Dr. Loistine Simas   Reason for referral:  Chief Complaint  Patient presents with  . New Evaluation    Patient complains of right leg pain. States that [facility] told him he has a "blocked artery in leg".     HISTORY OF PRESENT ILLNESS: The patient is appeared today for evaluation of right leg pain.  The patient states that approximately 7 months ago he felt a pop in his right leg.  Since that time he has had difficulty with pain and tingling in his foot.  He also states that approximate 3 months ago he bumped his leg on the OR and has developed a wound.  He is doing daily dressing changes.  He does not endorse claudication symptoms.  The patient suffers from diabetes.  His most recent hemoglobin A1c was 7.4.  He also has coronary artery disease and has undergone stenting in the past.  He is a current smoker but says he wants to stop.  He suffers from hypercholesterolemia.  He has had an allergy to certain statins in the past but is going to try and start atorvastatin today.  He suffers from hypertension which is managed with an ARB.  He recently had a ultrasound to rule out DVT and arterial disease was identified.  Past Medical History  Diagnosis Date  . Hypertension   . Diabetes mellitus   . Hyperlipidemia   . CAD (coronary artery disease)   . Anxiety   . Colon polyps   . Chronic back pain   . Panic attacks   . Heart attack   . PVD (peripheral vascular disease)     Past Surgical History  Procedure Laterality Date  . Coronary angioplasty with stent placement    . Multiple extractions with alveoloplasty  12/10/2011    Procedure: MULTIPLE EXTRACION WITH ALVEOLOPLASTY;  Surgeon: Gae Bon, DDS;  Location: [facility];  Service: Oral Surgery;  Laterality: N/A;  Right Maxillary Tuberosity Reduction; Right  Maxillary Buccal Exostosis; Bilateral Mandibular Tori   . Ear cyst excision   12/10/2011    Procedure: CYST REMOVAL;  Surgeon: Gae Bon, DDS;  Location: [facility];  Service: Oral Surgery;  Laterality: Left;  Left Mandible Cyst Removal    Social History   Social History  . Marital Status: Legally Separated    Spouse Name: N/A  . Number of Children: 3  . Years of Education: N/A   Occupational History  .      Unemployed   Social History Main Topics  . Smoking status: Current Every Day Smoker -- 1.00 packs/day for 25 years  . Smokeless tobacco: Not on file  . Alcohol Use: No  . Drug Use: No  . Sexual Activity: Not on file   Other Topics Concern  . Not on file   Social History Narrative    Family History  Problem Relation Age of Onset  . Diabetes Father   . Kidney failure Father   . Hypertension Father   . Hyperlipidemia Father   . Heart disease Father     Allergies as of 12/20/2014 - Review Complete 12/20/2014  Allergen Reaction Noted  . Codeine Shortness Of Breath and Itching 11/21/2012  . Gabapentin Other (See Comments) 11/21/2012  . Pregabalin  02/25/2014  . Simvastatin Itching 12/09/2012  . Venlafaxine  02/25/2014  . Hydrocodone Nausea Only and Rash 12/10/2011  . Lisinopril Itching and Rash 01/30/2011  Current Outpatient Prescriptions on File Prior to Visit  Medication Sig Dispense Refill  . aspirin 325 MG tablet Take 975 mg by mouth 3 (three) times daily as needed for mild pain.    . Exenatide ER (BYDUREON) 2 MG PEN Inject 2 mg into the skin daily.    . furosemide (LASIX) 20 MG tablet Take 20 mg by mouth daily as needed for fluid.     [name] Kitchen JANUMET 50-1000 MG per tablet Take 1 tablet by mouth 2 (two) times daily.  0  . losartan (COZAAR) 50 MG tablet Take 1 tablet (50 mg total) by mouth daily. 90 tablet 3  . metoprolol succinate (TOPROL-XL) 50 MG 24 hr tablet Take 50 mg by mouth daily. Take with or immediately following a meal.    . potassium chloride (KLOR-CON) 8 MEQ tablet Take 8 mEq by mouth daily as needed (when taking lasix).       No current facility-administered medications on file prior to visit.     REVIEW OF SYSTEMS: Cardiovascular: No chest pain, chest pressure.  Positive for pain in legs on walking and lying flat as well as leg swelling and weakness Pulmonary: No productive cough, asthma or wheezing. Neurologic: No weakness, paresthesias, aphasia, or amaurosis. No dizziness. Hematologic: No bleeding problems or clotting disorders. Musculoskeletal: No joint pain or joint swelling. Gastrointestinal: No blood in stool or hematemesis Genitourinary: No dysuria or hematuria. Psychiatric:: No history of major depression. Integumentary: No rashes or ulcers. Constitutional: No fever or chills.  PHYSICAL EXAMINATION:  Filed Vitals:   12/20/14 1459  BP: 128/70  Pulse: 80  Temp: 97.4 F (36.3 C)  TempSrc: Oral  Height: 5\' 9"  (1.753 m)  Weight: 200 lb 8 oz (90.946 kg)  SpO2: 98%   Body mass index is 29.6 kg/(m^2). General: The patient appears their stated age.   HEENT:  No gross abnormalities Pulmonary: Respirations are non-labored Abdomen: Soft and non-tender  Musculoskeletal: There are no major deformities.   Neurologic: No focal weakness or paresthesias are detected, Skin: 1.5 cm circular ulcer on the right lateral lower leg with no significant erythema Psychiatric: The patient has normal affect. Cardiovascular: There is a regular rate and rhythm without significant murmur appreciated.  Palpable left dorsalis pedis pulse, nonpalpable right no carotid bruits  Diagnostic Studies: ABIs were performed in the office today 0.34 on the right and 1.1 on the left   Assessment:  Right leg ulcer Plan: I discussed with the patient that this is a limb threatening problem.  I have recommended proceeding with angiography via a left femoral approach for evaluation of the right leg and intervention if possible.  I discussed the risks of percutaneous intervention including the risk of thromboembolic complications  and femoral hematoma.  He wishes to proceed.  I have scheduled his procedure for Tuesday, September 6.     Eldridge Abrahams, M.D. [facility] Office: [phone] Pager:  [phone]

## 2014-12-28 ENCOUNTER — Other Ambulatory Visit: Payer: Self-pay | Admitting: *Deleted

## 2014-12-28 ENCOUNTER — Ambulatory Visit (HOSPITAL_COMMUNITY)
Admission: RE | Admit: 2014-12-28 | Discharge: 2014-12-28 | Disposition: A | Discharge status: Home / Self Care | Payer: Medicaid Other | Source: Ambulatory Visit | Attending: Surgery | Admitting: Surgery

## 2014-12-28 ENCOUNTER — Encounter (HOSPITAL_COMMUNITY)
Admission: RE | Disposition: A | Discharge status: Home / Self Care | Payer: Medicaid Other | Source: Ambulatory Visit | Attending: Surgery

## 2014-12-28 DIAGNOSIS — M549 Dorsalgia, unspecified: Secondary | ICD-10-CM | POA: Diagnosis not present

## 2014-12-28 DIAGNOSIS — I10 Essential (primary) hypertension: Secondary | ICD-10-CM | POA: Diagnosis not present

## 2014-12-28 DIAGNOSIS — Z955 Presence of coronary angioplasty implant and graft: Secondary | ICD-10-CM | POA: Diagnosis not present

## 2014-12-28 DIAGNOSIS — I2510 Atherosclerotic heart disease of native coronary artery without angina pectoris: Secondary | ICD-10-CM | POA: Diagnosis not present

## 2014-12-28 DIAGNOSIS — I252 Old myocardial infarction: Secondary | ICD-10-CM | POA: Diagnosis not present

## 2014-12-28 DIAGNOSIS — I739 Peripheral vascular disease, unspecified: Secondary | ICD-10-CM

## 2014-12-28 DIAGNOSIS — Z7982 Long term (current) use of aspirin: Secondary | ICD-10-CM | POA: Insufficient documentation

## 2014-12-28 DIAGNOSIS — Z9862 Peripheral vascular angioplasty status: Secondary | ICD-10-CM

## 2014-12-28 DIAGNOSIS — G8929 Other chronic pain: Secondary | ICD-10-CM | POA: Diagnosis not present

## 2014-12-28 DIAGNOSIS — Z8249 Family history of ischemic heart disease and other diseases of the circulatory system: Secondary | ICD-10-CM | POA: Diagnosis not present

## 2014-12-28 DIAGNOSIS — E11622 Type 2 diabetes mellitus with other skin ulcer: Secondary | ICD-10-CM | POA: Diagnosis not present

## 2014-12-28 DIAGNOSIS — F419 Anxiety disorder, unspecified: Secondary | ICD-10-CM | POA: Diagnosis not present

## 2014-12-28 DIAGNOSIS — L97819 Non-pressure chronic ulcer of other part of right lower leg with unspecified severity: Secondary | ICD-10-CM | POA: Diagnosis not present

## 2014-12-28 DIAGNOSIS — I70238 Atherosclerosis of native arteries of right leg with ulceration of other part of lower right leg: Secondary | ICD-10-CM | POA: Insufficient documentation

## 2014-12-28 DIAGNOSIS — F17210 Nicotine dependence, cigarettes, uncomplicated: Secondary | ICD-10-CM | POA: Diagnosis not present

## 2014-12-28 DIAGNOSIS — E780 Pure hypercholesterolemia: Secondary | ICD-10-CM | POA: Diagnosis not present

## 2014-12-28 HISTORY — PX: PERIPHERAL VASCULAR CATHETERIZATION: SHX172C

## 2014-12-28 LAB — POCT I-STAT, CHEM 8
BUN: 13 mg/dL (ref 6–20)
CREATININE: 0.7 mg/dL (ref 0.61–1.24)
Calcium, Ion: 1.18 mmol/L (ref 1.12–1.23)
Chloride: 104 mmol/L (ref 101–111)
GLUCOSE: 170 mg/dL — AB (ref 65–99)
HEMATOCRIT: 47 % (ref 39.0–52.0)
Hemoglobin: 16 g/dL (ref 13.0–17.0)
Potassium: 4.2 mmol/L (ref 3.5–5.1)
Sodium: 139 mmol/L (ref 135–145)
TCO2: 23 mmol/L (ref 0–100)

## 2014-12-28 LAB — POCT ACTIVATED CLOTTING TIME
ACTIVATED CLOTTING TIME: 269 s
ACTIVATED CLOTTING TIME: 227 s
ACTIVATED CLOTTING TIME: 178 s

## 2014-12-28 LAB — GLUCOSE, CAPILLARY: Glucose-Capillary: 113 mg/dL — ABNORMAL HIGH (ref 65–99)

## 2014-12-28 SURGERY — ABDOMINAL AORTOGRAM

## 2014-12-28 MED ORDER — SODIUM CHLORIDE 0.9 % IV SOLN: 1.0000 mL/kg/h | INTRAVENOUS | Status: DC

## 2014-12-28 MED ORDER — MIDAZOLAM HCL 2 MG/2ML IJ SOLN
INTRAMUSCULAR | Status: DC | PRN
  Administered 2014-12-28 (×5): 1 mg

## 2014-12-28 MED ORDER — SODIUM CHLORIDE 0.9 % IV SOLN
INTRAVENOUS | Status: DC
  Administered 2014-12-28: 09:00:00 via INTRAVENOUS

## 2014-12-28 MED ORDER — HEPARIN SODIUM (PORCINE) 1000 UNIT/ML IJ SOLN
INTRAMUSCULAR | Status: DC | PRN
  Administered 2014-12-28: 9000 [IU] via INTRAVENOUS
  Administered 2014-12-28: 1000 [IU] via INTRAVENOUS

## 2014-12-28 MED ORDER — GUAIFENESIN-DM 100-10 MG/5ML PO SYRP
15.0000 mL | ORAL_SOLUTION | ORAL | Status: DC | PRN
  Filled 2014-12-28: qty 15

## 2014-12-28 MED ORDER — HYDROMORPHONE HCL 1 MG/ML IJ SOLN
INTRAMUSCULAR | Status: AC
  Filled 2014-12-28: qty 1

## 2014-12-28 MED ORDER — ACETAMINOPHEN 325 MG PO TABS
325.0000 mg | ORAL_TABLET | ORAL | Status: DC | PRN
  Filled 2014-12-28: qty 2

## 2014-12-28 MED ORDER — CLOPIDOGREL BISULFATE 75 MG PO TABS
ORAL_TABLET | ORAL | Status: DC | PRN
  Administered 2014-12-28: 300 mg via ORAL

## 2014-12-28 MED ORDER — FENTANYL CITRATE (PF) 100 MCG/2ML IJ SOLN
INTRAMUSCULAR | Status: DC | PRN
  Administered 2014-12-28 (×6): 25 ug via INTRAVENOUS

## 2014-12-28 MED ORDER — OXYCODONE HCL 5 MG PO TABS: 5.0000 mg | ORAL_TABLET | ORAL | Status: DC | PRN

## 2014-12-28 MED ORDER — LIDOCAINE HCL (PF) 1 % IJ SOLN
INTRAMUSCULAR | Status: DC | PRN
  Administered 2014-12-28: 12 mL

## 2014-12-28 MED ORDER — CLOPIDOGREL BISULFATE 75 MG PO TABS: 75.0000 mg | ORAL_TABLET | Freq: Every day | ORAL | Status: DC

## 2014-12-28 MED ORDER — PHENOL 1.4 % MT LIQD
1.0000 | OROMUCOSAL | Status: DC | PRN
  Filled 2014-12-28: qty 177

## 2014-12-28 MED ORDER — HEPARIN SODIUM (PORCINE) 1000 UNIT/ML IJ SOLN
INTRAMUSCULAR | Status: AC
Start: 2014-12-28 — End: 2014-12-28
  Filled 2014-12-28: qty 1

## 2014-12-28 MED ORDER — LIDOCAINE HCL (PF) 1 % IJ SOLN
INTRAMUSCULAR | Status: AC
  Filled 2014-12-28: qty 30

## 2014-12-28 MED ORDER — HEPARIN (PORCINE) IN NACL 2-0.9 UNIT/ML-% IJ SOLN
INTRAMUSCULAR | Status: AC
  Filled 2014-12-28: qty 1000

## 2014-12-28 MED ORDER — ACETAMINOPHEN 325 MG RE SUPP
325.0000 mg | RECTAL | Status: DC | PRN
Start: 2014-12-28 — End: 2014-12-28
  Filled 2014-12-28: qty 2

## 2014-12-28 MED ORDER — CLOPIDOGREL BISULFATE 300 MG PO TABS
ORAL_TABLET | ORAL | Status: AC
  Filled 2014-12-28: qty 1

## 2014-12-28 MED ORDER — MIDAZOLAM HCL 2 MG/2ML IJ SOLN
INTRAMUSCULAR | Status: AC
  Filled 2014-12-28: qty 4

## 2014-12-28 MED ORDER — ALUM & MAG HYDROXIDE-SIMETH 200-200-20 MG/5ML PO SUSP
15.0000 mL | ORAL | Status: DC | PRN
  Filled 2014-12-28: qty 30

## 2014-12-28 MED ORDER — FENTANYL CITRATE (PF) 100 MCG/2ML IJ SOLN
INTRAMUSCULAR | Status: AC
  Filled 2014-12-28: qty 4

## 2014-12-28 MED ORDER — HYDROMORPHONE HCL 1 MG/ML IJ SOLN
0.5000 mg | INTRAMUSCULAR | Status: DC | PRN
  Administered 2014-12-28 (×2): 1 mg via INTRAVENOUS

## 2014-12-28 MED ORDER — HYDRALAZINE HCL 20 MG/ML IJ SOLN: 5.0000 mg | INTRAMUSCULAR | Status: DC | PRN

## 2014-12-28 MED ORDER — CLOPIDOGREL BISULFATE 300 MG PO TABS: 300.0000 mg | ORAL_TABLET | Freq: Once | ORAL | Status: DC

## 2014-12-28 MED ORDER — METOPROLOL TARTRATE 1 MG/ML IV SOLN: 2.0000 mg | INTRAVENOUS | Status: DC | PRN

## 2014-12-28 MED ORDER — LABETALOL HCL 5 MG/ML IV SOLN: 10.0000 mg | INTRAVENOUS | Status: DC | PRN

## 2014-12-28 MED ORDER — ONDANSETRON HCL 4 MG/2ML IJ SOLN: 4.0000 mg | Freq: Four times a day (QID) | INTRAMUSCULAR | Status: DC | PRN

## 2014-12-28 MED ORDER — DOCUSATE SODIUM 100 MG PO CAPS: 100.0000 mg | ORAL_CAPSULE | Freq: Every day | ORAL | Status: DC

## 2014-12-28 SURGICAL SUPPLY — 26 items
BALLOON POWERFLX PRO 4X100X135 (BALLOONS) ×2 IMPLANT
BALLOON POWERFLX PRO 5X100X135 (BALLOONS) ×2 IMPLANT
CATH OMNI FLUSH 5F 65CM (CATHETERS) ×2 IMPLANT
CATH QUICKCROSS .035X135CM (MICROCATHETER) ×2 IMPLANT
CATH QUICKCROSS SUPP .035X90CM (MICROCATHETER) ×2 IMPLANT
COVER PRB 48X5XTLSCP FOLD TPE (BAG) IMPLANT
COVER PROBE 5X48 (BAG) ×3
DEVICE CONTINUOUS FLUSH (MISCELLANEOUS) ×2 IMPLANT
DRAPE ZERO GRAVITY STERILE (DRAPES) ×2 IMPLANT
GUIDEWIRE ANGLED .035X150CM (WIRE) ×2 IMPLANT
GUIDEWIRE ANGLED .035X260CM (WIRE) ×2 IMPLANT
KIT ENCORE 26 ADVANTAGE (KITS) ×2 IMPLANT
KIT MICROINTRODUCER STIFF 5F (SHEATH) ×2 IMPLANT
KIT PV (KITS) ×3 IMPLANT
SHEATH PINNACLE 5F 10CM (SHEATH) ×2 IMPLANT
SHEATH PINNACLE ST 7F 45CM (SHEATH) ×2 IMPLANT
SHIELD RADPAD SCOOP 12X17 (MISCELLANEOUS) ×2 IMPLANT
STENT GENESIS OPTA 8X39X80 (Permanent Stent) ×2 IMPLANT
STENT SMART CONTROL 6X30X120 (Permanent Stent) ×2 IMPLANT
STENT SMART FLEX 6X120X120 (Permanent Stent) ×2 IMPLANT
SYR MEDRAD MARK V 150ML (SYRINGE) ×3 IMPLANT
TAPE RADIOPAQUE TURBO (MISCELLANEOUS) ×2 IMPLANT
TRANSDUCER W/STOPCOCK (MISCELLANEOUS) ×3 IMPLANT
TRAY PV CATH (CUSTOM PROCEDURE TRAY) ×3 IMPLANT
WIRE BENTSON .035X145CM (WIRE) ×2 IMPLANT
WIRE HI TORQ VERSACORE J 260CM (WIRE) ×2 IMPLANT

## 2014-12-28 NOTE — Progress Notes (Signed)
Pt has chronic back pain and would like to take home med/ 1 percocet 7.5/325. Dr Trula Slade was notified and states it is ok to take home pain med.

## 2014-12-28 NOTE — H&P (View-Only) (Signed)
   Patient name: [name] MRN: [id number] DOB: 10/20/1960 Sex: male  Referred by: Dr. Ybanez   Reason for referral:  Chief Complaint  Patient presents with  . New Evaluation    Patient complains of right leg pain. States that [facility] told him he has a "blocked artery in leg".     HISTORY OF PRESENT ILLNESS: The patient is appeared today for evaluation of right leg pain.  The patient states that approximately 7 months ago he felt a pop in his right leg.  Since that time he has had difficulty with pain and tingling in his foot.  He also states that approximate 3 months ago he bumped his leg on the OR and has developed a wound.  He is doing daily dressing changes.  He does not endorse claudication symptoms.  The patient suffers from diabetes.  His most recent hemoglobin A1c was 7.4.  He also has coronary artery disease and has undergone stenting in the past.  He is a current smoker but says he wants to stop.  He suffers from hypercholesterolemia.  He has had an allergy to certain statins in the past but is going to try and start atorvastatin today.  He suffers from hypertension which is managed with an ARB.  He recently had a ultrasound to rule out DVT and arterial disease was identified.  Past Medical History  Diagnosis Date  . Hypertension   . Diabetes mellitus   . Hyperlipidemia   . CAD (coronary artery disease)   . Anxiety   . Colon polyps   . Chronic back pain   . Panic attacks   . Heart attack   . PVD (peripheral vascular disease)     Past Surgical History  Procedure Laterality Date  . Coronary angioplasty with stent placement    . Multiple extractions with alveoloplasty  12/10/2011    Procedure: MULTIPLE EXTRACION WITH ALVEOLOPLASTY;  Surgeon: Scott M Jensen, DDS;  Location: [facility] OR;  Service: Oral Surgery;  Laterality: N/A;  Right Maxillary Tuberosity Reduction; Right  Maxillary Buccal Exostosis; Bilateral Mandibular Tori   . Ear cyst excision   12/10/2011    Procedure: CYST REMOVAL;  Surgeon: Scott M Jensen, DDS;  Location: [facility] OR;  Service: Oral Surgery;  Laterality: Left;  Left Mandible Cyst Removal    Social History   Social History  . Marital Status: Legally Separated    Spouse Name: N/A  . Number of Children: 3  . Years of Education: N/A   Occupational History  .      Unemployed   Social History Main Topics  . Smoking status: Current Every Day Smoker -- 1.00 packs/day for 25 years  . Smokeless tobacco: Not on file  . Alcohol Use: No  . Drug Use: No  . Sexual Activity: Not on file   Other Topics Concern  . Not on file   Social History Narrative    Family History  Problem Relation Age of Onset  . Diabetes Father   . Kidney failure Father   . Hypertension Father   . Hyperlipidemia Father   . Heart disease Father     Allergies as of 12/20/2014 - Review Complete 12/20/2014  Allergen Reaction Noted  . Codeine Shortness Of Breath and Itching 11/21/2012  . Gabapentin Other (See Comments) 11/21/2012  . Pregabalin  02/25/2014  . Simvastatin Itching 12/09/2012  . Venlafaxine  02/25/2014  . Hydrocodone Nausea Only and Rash 12/10/2011  . Lisinopril Itching and Rash 01/30/2011      Current Outpatient Prescriptions on File Prior to Visit  Medication Sig Dispense Refill  . aspirin 325 MG tablet Take 975 mg by mouth 3 (three) times daily as needed for mild pain.    . Exenatide ER (BYDUREON) 2 MG PEN Inject 2 mg into the skin daily.    . furosemide (LASIX) 20 MG tablet Take 20 mg by mouth daily as needed for fluid.     . JANUMET 50-1000 MG per tablet Take 1 tablet by mouth 2 (two) times daily.  0  . losartan (COZAAR) 50 MG tablet Take 1 tablet (50 mg total) by mouth daily. 90 tablet 3  . metoprolol succinate (TOPROL-XL) 50 MG 24 hr tablet Take 50 mg by mouth daily. Take with or immediately following a meal.    . potassium chloride (KLOR-CON) 8 MEQ tablet Take 8 mEq by mouth daily as needed (when taking lasix).       No current facility-administered medications on file prior to visit.     REVIEW OF SYSTEMS: Cardiovascular: No chest pain, chest pressure.  Positive for pain in legs on walking and lying flat as well as leg swelling and weakness Pulmonary: No productive cough, asthma or wheezing. Neurologic: No weakness, paresthesias, aphasia, or amaurosis. No dizziness. Hematologic: No bleeding problems or clotting disorders. Musculoskeletal: No joint pain or joint swelling. Gastrointestinal: No blood in stool or hematemesis Genitourinary: No dysuria or hematuria. Psychiatric:: No history of major depression. Integumentary: No rashes or ulcers. Constitutional: No fever or chills.  PHYSICAL EXAMINATION:  Filed Vitals:   12/20/14 1459  BP: 128/70  Pulse: 80  Temp: 97.4 F (36.3 C)  TempSrc: Oral  Height: 5' 9" (1.753 m)  Weight: 200 lb 8 oz (90.946 kg)  SpO2: 98%   Body mass index is 29.6 kg/(m^2). General: The patient appears their stated age.   HEENT:  No gross abnormalities Pulmonary: Respirations are non-labored Abdomen: Soft and non-tender  Musculoskeletal: There are no major deformities.   Neurologic: No focal weakness or paresthesias are detected, Skin: 1.5 cm circular ulcer on the right lateral lower leg with no significant erythema Psychiatric: The patient has normal affect. Cardiovascular: There is a regular rate and rhythm without significant murmur appreciated.  Palpable left dorsalis pedis pulse, nonpalpable right no carotid bruits  Diagnostic Studies: ABIs were performed in the office today 0.34 on the right and 1.1 on the left   Assessment:  Right leg ulcer Plan: I discussed with the patient that this is a limb threatening problem.  I have recommended proceeding with angiography via a left femoral approach for evaluation of the right leg and intervention if possible.  I discussed the risks of percutaneous intervention including the risk of thromboembolic complications  and femoral hematoma.  He wishes to proceed.  I have scheduled his procedure for Tuesday, September 6.     V. Wells Jammi Morrissette IV, M.D. [facility] Office: [phone] Pager:  [phone]   

## 2014-12-28 NOTE — Discharge Instructions (Signed)

## 2014-12-28 NOTE — Interval H&P Note (Signed)
History and Physical Interval Note:  12/28/2014 9:51 AM  [name]  has presented today for surgery, with the diagnosis of right leg ulcer  The various methods of treatment have been discussed with the patient and family. After consideration of risks, benefits and other options for treatment, the patient has consented to  Procedure(s): Abdominal Aortogram (N/A) as a surgical intervention .  The patient's history has been reviewed, patient examined, no change in status, stable for surgery.  I have reviewed the patient's chart and labs.  Questions were answered to the patient's satisfaction.     [name]

## 2014-12-28 NOTE — Op Note (Signed)
Patient name: [name] MRN: [id number] DOB: 1960-07-18 Sex: male  12/28/2014 Pre-operative Diagnosis: Right leg ulcer Post-operative diagnosis:  Same Surgeon:  [name] Procedure Performed:  1.  Ultrasound-guided access, left femoral artery  2.  Abdominal aortogram  3.  Right lower extremity runoff  4.  Stent, right popliteal artery  5.  Stent, right common iliac artery     Indications:  The patient has severe pain in his right leg as well as a nonhealing wound.  ABIs were 0.3.  He is here for further evaluation and possible treatment.  Findings: Distal superficial femoral and proximal popliteal artery occlusion successfully recanalized and stented using overlapping 6 mm Cordis self-expanding stents.  Residual stenosis was less than 10%.  50 mm gradient in the right common iliac artery associated with a 75% stenosis this was treated using a 8 x 39 balloon expandable stent with residual stenosis less than 10%  Procedure:  The patient was identified in the holding area and taken to room 8.  The patient was then placed supine on the table and prepped and draped in the usual sterile fashion.  A time out was called.  Ultrasound was used to evaluate the left common femoral artery.  It was patent .  A digital ultrasound image was acquired.  A micropuncture needle was used to access the left common femoral artery under ultrasound guidance.  An 018 wire was advanced without resistance and a micropuncture sheath was placed.  The 018 wire was removed and a benson wire was placed.  The micropuncture sheath was exchanged for a 5 french sheath.  An omniflush catheter was advanced over the wire to the level of L-1.  An abdominal angiogram was obtained.  Next, using the omniflush catheter and a benson wire, the aortic bifurcation was crossed and the catheter was placed into theright external iliac artery and right runoff was obtained.    Findings:   Aortogram:  No significant suprarenal aortic  stenosis is identified.  Luminal irregularity is noted within bilateral renal arteries.  Possibly a hemodynamically significant lesion on the left.  The infrarenal abdominal aorta is calcified.  There is no pressure gradient throughout the infrarenal aorta.  Approximately 30% stenosis at the origin of the left common iliac artery.  No significant left external iliac artery stenosis.  75% stenosis within the right common iliac artery and no significant right external iliac artery stenosis.  Right Lower Extremity:  Right common femoral and profunda femoral artery are widely patent.  The right superficial femoral artery is patent until the adductor canal where it becomes occluded.  There was reconstitution at the joint space.  The below knee popliteal artery is widely patent.  There is two-vessel runoff via the posterior tibial and peroneal artery.  Left Lower Extremity:  Not evaluated  Intervention:  After the above images were acquired, the decision was made to proceed with intervention.  A 7 French sheath was advanced over the aortic bifurcation and positioned into the right external iliac artery.  The patient was fully heparinized.  Using a Glidewire and a quick cross, subintimal recanalization was performed with reentry at the joint space.  This was confirmed by contrast injection with the catheter tip in the below knee popliteal artery.  A 035 wire was inserted.  The subintimal tract was dilated with a 4 mm balloon, followed by a 6 x 1 20 self-expanding Cordis stent.  This was then molded to confirmation with a 5 mm balloon.  Completion imaging showed a residual dissection at the distal part of the stent.  I elected to treat this with a second stent, this time using a 6 x 30 and deploying it is close to the joint space as possible.  It was again molded with a 5 mm balloon and this time a completion imaging showed resolution of the stenosis.  Runoff was unchanged.  Next I further up I waited the right common  iliac lesion with pullback pressures.  There was approximately a 50 mm gradient.  I elected to primarily stent this.  A Cordis 8 x 39 balloon expandable stent was deployed in the common iliac artery.  The balloon was taken to nominal pressure.  Completion imaging revealed resolution of the stenosis.  At this point times the wire was removed and the sheath was withdrawn to the left external iliac artery.  The patient be taken the holding area for sheath pull once his granulation profile corrects.  Impression:  #1  right distal superficial femoral and proximal popliteal artery occlusion successfully recanalized and stented using 6 mm stents  #2  50 mm gradient across the right common iliac artery lesion successfully treated using an 8 x 39 balloon expandable stent  #3  two-vessel runoff to the right leg via the posterior tibial and peroneal artery    V. [name], M.D. [facility] Office: [phone] Pager:  [phone]

## 2014-12-28 NOTE — Progress Notes (Signed)
Pt states his back pain continues/ 6/10, bp is elevated, pt states its due to pain, pain med given see MAR.

## 2014-12-28 NOTE — Research (Signed)
SAFE-DCB Informed Consent   Subject Name: [name]  Subject met inclusion and exclusion criteria.  The informed consent form, study requirements and expectations were reviewed with the subject and questions and concerns were addressed prior to the signing of the consent form.  The subject verbalized understanding of the trail requirements.  The subject agreed to participate in the SAFE-DCB trial and signed the informed consent.  The informed consent was obtained prior to performance of any protocol-specific procedures for the subject.  A copy of the signed informed consent was given to the subject and a copy was placed in the subject's medical record.  [name],Lee Ransom W 12/28/2014, 9:37 AM

## 2014-12-28 NOTE — Progress Notes (Signed)
Site area: left groin fa sheath Site Prior to Removal:  Level  0 Pressure Applied For:  25 minutes Manual:   yes Patient Status During Pull:  stable Post Pull Site:  Level  0 Post Pull Instructions Given:  yes Post Pull Pulses Present: yes Dressing Applied:  tegaderm Bedrest begins @  1400 Comments:  0

## 2014-12-29 ENCOUNTER — Telehealth: Payer: Self-pay

## 2014-12-29 ENCOUNTER — Encounter (HOSPITAL_COMMUNITY): Payer: Self-pay | Admitting: Surgery

## 2014-12-29 DIAGNOSIS — I739 Peripheral vascular disease, unspecified: Secondary | ICD-10-CM

## 2014-12-29 DIAGNOSIS — Z95828 Presence of other vascular implants and grafts: Secondary | ICD-10-CM

## 2014-12-29 MED ORDER — CLOPIDOGREL BISULFATE 75 MG PO TABS: 75.0000 mg | ORAL_TABLET | Freq: Every day | ORAL | Status: DC

## 2014-12-29 MED FILL — Clopidogrel Bisulfate Tab 300 MG (Base Equiv): ORAL | Qty: 1 | Status: AC

## 2014-12-29 MED FILL — Heparin Sodium (Porcine) 2 Unit/ML in Sodium Chloride 0.9%: INTRAMUSCULAR | Qty: 1000 | Status: AC

## 2014-12-29 NOTE — Telephone Encounter (Signed)
Pt. Called to inquire about a medication to be sent to his pharmacy, following his procedure yesterday.  Stated he was advised to contact office if the medication was not at his pharmacy.  Call placed to Dr. Trula Slade.  Rec'd v.o. For Plavix 75 mg, take 1 tab, po, qd; #30; RF x 3.  Will send order to [facility] per pt. Request.

## 2015-03-10 ENCOUNTER — Encounter (HOSPITAL_COMMUNITY): Payer: Medicaid Other

## 2015-03-10 ENCOUNTER — Ambulatory Visit (HOSPITAL_COMMUNITY)
Admission: RE | Admit: 2015-03-10 | Discharge: 2015-03-10 | Disposition: A | Discharge status: Home / Self Care | Payer: Medicaid Other | Source: Ambulatory Visit | Attending: Vascular Surgery | Admitting: Vascular Surgery

## 2015-03-10 ENCOUNTER — Other Ambulatory Visit: Payer: Self-pay | Admitting: Surgery

## 2015-03-10 DIAGNOSIS — Z48812 Encounter for surgical aftercare following surgery on the circulatory system: Secondary | ICD-10-CM | POA: Insufficient documentation

## 2015-03-10 DIAGNOSIS — I70219 Atherosclerosis of native arteries of extremities with intermittent claudication, unspecified extremity: Secondary | ICD-10-CM

## 2015-03-10 DIAGNOSIS — I739 Peripheral vascular disease, unspecified: Secondary | ICD-10-CM | POA: Diagnosis not present

## 2015-03-15 ENCOUNTER — Encounter: Payer: Self-pay | Admitting: Surgery

## 2015-03-21 ENCOUNTER — Encounter: Payer: Self-pay | Admitting: Surgery

## 2015-03-21 ENCOUNTER — Ambulatory Visit (INDEPENDENT_AMBULATORY_CARE_PROVIDER_SITE_OTHER): Payer: Medicaid Other | Admitting: Surgery

## 2015-03-21 VITALS — BP 139/76 | HR 77 | Temp 98.3°F | Resp 18 | Ht 69.0 in | Wt 195.1 lb

## 2015-03-21 DIAGNOSIS — I70332 Atherosclerosis of unspecified type of bypass graft(s) of the right leg with ulceration of calf: Secondary | ICD-10-CM | POA: Diagnosis not present

## 2015-03-21 NOTE — Addendum Note (Signed)
Addended by: Dorthula Rue L on: 03/21/2015 04:04 PM   Modules accepted: Orders

## 2015-03-21 NOTE — Progress Notes (Signed)
Patient name: RENNER SEABROOK MRN: [id number] DOB: 09-03-1960 Sex: male     Chief Complaint  Patient presents with  . Follow-up    FU  LE arterial and ABIs on 03-10-2015 ,  pt. c/o intermittent right foot numbness      HISTORY OF PRESENT ILLNESS:  the patient is back for follow-up. He initially presented with lifestyle limiting claudication in the right leg as well as a nonhealing wound. His ABI was 0.34 on the right and normal on the left. On 12/28/2014 he was taken to the Cath Lab and underwent angiography.  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  This was successfully recanalized and stented using overlapping 6 mm Cordis self-expanding stents.  There is also a significant gradient within the right common iliac artery which was also treated using an 8 x 39 balloon expandable stent.  The patient states today that his ulcer healed very quickly after the procedure.  He is no longer walking with a cane.  He is able to ambulate without significant limitations.  He takes his Plavix occasionally but not routinely.  Past Medical History  Diagnosis Date  . Hypertension   . Diabetes mellitus   . Hyperlipidemia   . CAD (coronary artery disease)   . Anxiety   . Colon polyps   . Chronic back pain   . Panic attacks   . Heart attack ([place])   . PVD (peripheral vascular disease) [facility])     Past Surgical History  Procedure Laterality Date  . Coronary angioplasty with stent placement    . Multiple extractions with alveoloplasty  12/10/2011    Procedure: MULTIPLE EXTRACION WITH ALVEOLOPLASTY;  Surgeon: Gae Bon, DDS;  Location: [facility];  Service: Oral Surgery;  Laterality: N/A;  Right Maxillary Tuberosity Reduction; Right  Maxillary Buccal Exostosis; Bilateral Mandibular Tori   . Ear cyst excision  12/10/2011    Procedure: CYST REMOVAL;  Surgeon: Gae Bon, DDS;  Location: [facility];  Service: Oral Surgery;  Laterality: Left;  Left Mandible Cyst  Removal  . Peripheral vascular catheterization N/A 12/28/2014    Procedure: Abdominal Aortogram;  Surgeon: Serafina Mitchell, MD;  Location: [facility];  Service: Cardiovascular;  Laterality: N/A;    Social History   Social History  . Marital Status: Legally Separated    Spouse Name: N/A  . Number of Children: 3  . Years of Education: N/A   Occupational History  .      Unemployed   Social History Main Topics  . Smoking status: Current Every Day Smoker -- 0.25 packs/day for 25 years  . Smokeless tobacco: Not on file  . Alcohol Use: No  . Drug Use: No  . Sexual Activity: Not on file   Other Topics Concern  . Not on file   Social History Narrative    Family History  Problem Relation Age of Onset  . Diabetes Father   . Kidney failure Father   . Hypertension Father   . Hyperlipidemia Father   . Heart disease Father     Allergies as of 03/21/2015 - Review Complete 03/21/2015  Allergen Reaction Noted  . Codeine Shortness Of Breath and Itching 11/21/2012  . Gabapentin Other (See Comments) 11/21/2012  . Pregabalin  02/25/2014  . Simvastatin Itching 12/09/2012  . Venlafaxine  02/25/2014  . Hydrocodone Nausea Only and Rash 12/10/2011  . Lisinopril Itching and Rash 01/30/2011    Current Outpatient Prescriptions on File Prior  to Visit  Medication Sig Dispense Refill  . aspirin 325 MG tablet Take 325 mg by mouth 3 (three) times daily as needed for mild pain.     [name] Kitchen atorvastatin (LIPITOR) 20 MG tablet Take 20 mg by mouth.    . baclofen (LIORESAL) 10 MG tablet Take 10 mg by mouth once a week.     . clopidogrel (PLAVIX) 75 MG tablet Take 1 tablet (75 mg total) by mouth daily. 30 tablet 3  . Exenatide ER (BYDUREON) 2 MG PEN Inject 2 mg into the skin once a week.     . furosemide (LASIX) 20 MG tablet Take 20 mg by mouth daily as needed for fluid.     [name] Kitchen JANUMET 50-1000 MG per tablet Take 1 tablet by mouth 2 (two) times daily.  0  . losartan (COZAAR) 50 MG tablet Take 1  tablet (50 mg total) by mouth daily. 90 tablet 3  . metFORMIN (GLUCOPHAGE) 1000 MG tablet Take 1,000 mg by mouth 2 (two) times daily.    . metoprolol succinate (TOPROL-XL) 50 MG 24 hr tablet Take 50 mg by mouth daily. Take with or immediately following a meal.    . omeprazole (PRILOSEC) 20 MG capsule Take 1 capsule by mouth daily.    [name] Kitchen oxyCODONE-acetaminophen (PERCOCET) 7.5-325 MG per tablet Take 1 tablet by mouth every 6 (six) hours as needed for severe pain.     No current facility-administered medications on file prior to visit.     REVIEW OF SYSTEMS:  see history of present illness otherwise negative  PHYSICAL EXAMINATION:   Vital signs are  Filed Vitals:   03/21/15 1459  BP: 139/76  Pulse: 77  Temp: 98.3 F (36.8 C)  TempSrc: Oral  Resp: 18  Height: 5\' 9"  (1.753 m)  Weight: 195 lb 1.6 oz (88.497 kg)  SpO2: 98%   Body mass index is 28.8 kg/(m^2). General: The patient appears their stated age. HEENT:  No gross abnormalities Pulmonary:  Non labored breathing Musculoskeletal: There are no major deformities. Neurologic: No focal weakness or paresthesias are detected, Skin: The right leg ulcer has healed Psychiatric: The patient has normal affect. Cardiovascular: There is a regular rate and rhythm without significant murmur appreciated.   Diagnostic Studies  duplex was ordered and reviewed. The stents repair to be widely patent.  Waveforms are triphasic throughout the entire right leg.   ABIs were denied by Medicaid  Assessment:  status post percutaneous intervention of the right leg for ulcer Plan:  the patient's ulcer has healed.  His claudication symptoms are dramatically improved.  I have  Encouraged him to continue taking aspirin and Plavix for at least 3 months , preferably longer.  He will follow-up in 6 months with a repeat duplex and ankle-brachial indices.  I also discussed the importance of contacting me should he feel like his symptoms in the right leg are  returning.  Eldridge Abrahams, M.D. [facility] [place] Office: [phone] Pager:  [phone]

## 2015-03-28 ENCOUNTER — Ambulatory Visit: Payer: Medicaid Other | Admitting: Surgery

## 2015-09-20 ENCOUNTER — Encounter: Payer: Self-pay | Admitting: Family

## 2015-09-26 ENCOUNTER — Ambulatory Visit (INDEPENDENT_AMBULATORY_CARE_PROVIDER_SITE_OTHER): Payer: Medicaid Other | Admitting: Family

## 2015-09-26 ENCOUNTER — Ambulatory Visit (INDEPENDENT_AMBULATORY_CARE_PROVIDER_SITE_OTHER)
Admission: RE | Admit: 2015-09-26 | Discharge: 2015-09-26 | Disposition: A | Discharge status: Home / Self Care | Payer: Medicaid Other | Source: Ambulatory Visit | Attending: Surgery | Admitting: Surgery

## 2015-09-26 ENCOUNTER — Encounter: Payer: Self-pay | Admitting: Family

## 2015-09-26 ENCOUNTER — Ambulatory Visit (HOSPITAL_COMMUNITY)
Admission: RE | Admit: 2015-09-26 | Discharge: 2015-09-26 | Disposition: A | Discharge status: Home / Self Care | Payer: Medicaid Other | Source: Ambulatory Visit | Attending: Surgery | Admitting: Surgery

## 2015-09-26 VITALS — BP 131/77 | HR 70 | Temp 98.4°F | Resp 18 | Ht 67.0 in | Wt 184.3 lb

## 2015-09-26 DIAGNOSIS — E1151 Type 2 diabetes mellitus with diabetic peripheral angiopathy without gangrene: Secondary | ICD-10-CM | POA: Diagnosis not present

## 2015-09-26 DIAGNOSIS — I10 Essential (primary) hypertension: Secondary | ICD-10-CM | POA: Insufficient documentation

## 2015-09-26 DIAGNOSIS — I739 Peripheral vascular disease, unspecified: Secondary | ICD-10-CM

## 2015-09-26 DIAGNOSIS — I2510 Atherosclerotic heart disease of native coronary artery without angina pectoris: Secondary | ICD-10-CM | POA: Insufficient documentation

## 2015-09-26 DIAGNOSIS — E785 Hyperlipidemia, unspecified: Secondary | ICD-10-CM | POA: Insufficient documentation

## 2015-09-26 DIAGNOSIS — I779 Disorder of arteries and arterioles, unspecified: Secondary | ICD-10-CM | POA: Diagnosis not present

## 2015-09-26 DIAGNOSIS — Z4889 Encounter for other specified surgical aftercare: Secondary | ICD-10-CM

## 2015-09-26 DIAGNOSIS — I70332 Atherosclerosis of unspecified type of bypass graft(s) of the right leg with ulceration of calf: Secondary | ICD-10-CM

## 2015-09-26 DIAGNOSIS — R0989 Other specified symptoms and signs involving the circulatory and respiratory systems: Secondary | ICD-10-CM | POA: Diagnosis present

## 2015-09-26 DIAGNOSIS — F17200 Nicotine dependence, unspecified, uncomplicated: Secondary | ICD-10-CM

## 2015-09-26 DIAGNOSIS — F419 Anxiety disorder, unspecified: Secondary | ICD-10-CM | POA: Diagnosis not present

## 2015-09-26 DIAGNOSIS — E119 Type 2 diabetes mellitus without complications: Secondary | ICD-10-CM | POA: Insufficient documentation

## 2015-09-26 DIAGNOSIS — I771 Stricture of artery: Secondary | ICD-10-CM | POA: Diagnosis not present

## 2015-09-26 DIAGNOSIS — Z95828 Presence of other vascular implants and grafts: Secondary | ICD-10-CM

## 2015-09-26 DIAGNOSIS — I70209 Unspecified atherosclerosis of native arteries of extremities, unspecified extremity: Secondary | ICD-10-CM

## 2015-09-26 DIAGNOSIS — Z48812 Encounter for surgical aftercare following surgery on the circulatory system: Secondary | ICD-10-CM

## 2015-09-26 DIAGNOSIS — Z789 Other specified health status: Secondary | ICD-10-CM

## 2015-09-26 DIAGNOSIS — Z720 Tobacco use: Secondary | ICD-10-CM

## 2015-09-26 NOTE — Patient Instructions (Signed)
Peripheral Vascular Disease Peripheral vascular disease (PVD) is a disease of the blood vessels that are not part of your heart and brain. A simple term for PVD is poor circulation. In most cases, PVD narrows the blood vessels that carry blood from your heart to the rest of your body. This can result in a decreased supply of blood to your arms, legs, and internal organs, like your stomach or kidneys. However, it most often affects a person's lower legs and feet. There are two types of PVD.  Organic PVD. This is the more common type. It is caused by damage to the structure of blood vessels.  Functional PVD. This is caused by conditions that make blood vessels contract and tighten (spasm). Without treatment, PVD tends to get worse over time. PVD can also lead to acute ischemic limb. This is when an arm or limb suddenly has trouble getting enough blood. This is a medical emergency. CAUSES Each type of PVD has many different causes. The most common cause of PVD is buildup of a fatty material (plaque) inside of your arteries (atherosclerosis). Small amounts of plaque can break off from the walls of the blood vessels and become lodged in a smaller artery. This blocks blood flow and can cause acute ischemic limb. Other common causes of PVD include:  Blood clots that form inside of blood vessels.  Injuries to blood vessels.  Diseases that cause inflammation of blood vessels or cause blood vessel spasms.  Health behaviors and health history that increase your risk of developing PVD. RISK FACTORS  You may have a greater risk of PVD if you:  Have a family history of PVD.  Have certain medical conditions, including:  High cholesterol.  Diabetes.  High blood pressure (hypertension).  Coronary heart disease.  Past problems with blood clots.  Past injury, such as burns or a broken bone. These may have damaged blood vessels in your limbs.  Buerger disease. This is caused by inflamed blood  vessels in your hands and feet.  Some forms of arthritis.  Rare birth defects that affect the arteries in your legs.  Use tobacco.  Do not get enough exercise.  Are obese.  Are age 50 or older. SIGNS AND SYMPTOMS  PVD may cause many different symptoms. Your symptoms depend on what part of your body is not getting enough blood. Some common signs and symptoms include:  Cramps in your lower legs. This may be a symptom of poor leg circulation (claudication).  Pain and weakness in your legs while you are physically active that goes away when you rest (intermittent claudication).  Leg pain when at rest.  Leg numbness, tingling, or weakness.  Coldness in a leg or foot, especially when compared with the other leg.  Skin or hair changes. These can include:  Hair loss.  Shiny skin.  Pale or bluish skin.  Thick toenails.  Inability to get or maintain an erection (erectile dysfunction). People with PVD are more prone to developing ulcers and sores on their toes, feet, or legs. These may take longer than normal to heal. DIAGNOSIS Your health care provider may diagnose PVD from your signs and symptoms. The health care provider will also do a physical exam. You may have tests to find out what is causing your PVD and determine its severity. Tests may include:  Blood pressure recordings from your arms and legs and measurements of the strength of your pulses (pulse volume recordings).  Imaging studies using sound waves to take pictures of   the blood flow through your blood vessels (Doppler ultrasound).  Injecting a dye into your blood vessels before having imaging studies using:  X-rays (angiogram or arteriogram).  Computer-generated X-rays (CT angiogram).  A powerful electromagnetic field and a computer (magnetic resonance angiogram or MRA). TREATMENT Treatment for PVD depends on the cause of your condition and the severity of your symptoms. It also depends on your age. Underlying  causes need to be treated and controlled. These include long-lasting (chronic) conditions, such as diabetes, high cholesterol, and high blood pressure. You may need to first try making lifestyle changes and taking medicines. Surgery may be needed if these do not work. Lifestyle changes may include:  Quitting smoking.  Exercising regularly.  Following a low-fat, low-cholesterol diet. Medicines may include:  Blood thinners to prevent blood clots.  Medicines to improve blood flow.  Medicines to improve your blood cholesterol levels. Surgical procedures may include:  A procedure that uses an inflated balloon to open a blocked artery and improve blood flow (angioplasty).  A procedure to put in a tube (stent) to keep a blocked artery open (stent implant).  Surgery to reroute blood flow around a blocked artery (peripheral bypass surgery).  Surgery to remove dead tissue from an infected wound on the affected limb.  Amputation. This is surgical removal of the affected limb. This may be necessary in cases of acute ischemic limb that are not improved through medical or surgical treatments. HOME CARE INSTRUCTIONS  Take medicines only as directed by your health care provider.  Do not use any tobacco products, including cigarettes, chewing tobacco, or electronic cigarettes. If you need help quitting, ask your health care provider.  Lose weight if you are overweight, and maintain a healthy weight as directed by your health care provider.  Eat a diet that is low in fat and cholesterol. If you need help, ask your health care provider.  Exercise regularly. Ask your health care provider to suggest some good activities for you.  Use compression stockings or other mechanical devices as directed by your health care provider.  Take good care of your feet.  Wear comfortable shoes that fit well.  Check your feet often for any cuts or sores. SEEK MEDICAL CARE IF:  You have cramps in your legs  while walking.  You have leg pain when you are at rest.  You have coldness in a leg or foot.  Your skin changes.  You have erectile dysfunction.  You have cuts or sores on your feet that are not healing. SEEK IMMEDIATE MEDICAL CARE IF:  Your arm or leg turns cold and blue.  Your arms or legs become red, warm, swollen, painful, or numb.  You have chest pain or trouble breathing.  You suddenly have weakness in your face, arm, or leg.  You become very confused or lose the ability to speak.  You suddenly have a very bad headache or lose your vision.   This information is not intended to replace advice given to you by your health care provider. Make sure you discuss any questions you have with your health care provider.   Document Released: 05/17/2004 Document Revised: 04/30/2014 Document Reviewed: 09/17/2013 Elsevier Interactive Patient Education 2016 Elsevier Inc.     Steps to Quit Smoking  Smoking tobacco can be harmful to your health and can affect almost every organ in your body. Smoking puts you, and those around you, at risk for developing many serious chronic diseases. Quitting smoking is difficult, but it is one of   the best things that you can do for your health. It is never too late to quit. WHAT ARE THE BENEFITS OF QUITTING SMOKING? When you quit smoking, you lower your risk of developing serious diseases and conditions, such as:  Lung cancer or lung disease, such as COPD.  Heart disease.  Stroke.  Heart attack.  Infertility.  Osteoporosis and bone fractures. Additionally, symptoms such as coughing, wheezing, and shortness of breath may get better when you quit. You may also find that you get sick less often because your body is stronger at fighting off colds and infections. If you are pregnant, quitting smoking can help to reduce your chances of having a baby of low birth weight. HOW DO I GET READY TO QUIT? When you decide to quit smoking, create a plan to  make sure that you are successful. Before you quit:  Pick a date to quit. Set a date within the next two weeks to give you time to prepare.  Write down the reasons why you are quitting. Keep this list in places where you will see it often, such as on your bathroom mirror or in your car or wallet.  Identify the people, places, things, and activities that make you want to smoke (triggers) and avoid them. Make sure to take these actions:  Throw away all cigarettes at home, at work, and in your car.  Throw away smoking accessories, such as ashtrays and lighters.  Clean your car and make sure to empty the ashtray.  Clean your home, including curtains and carpets.  Tell your family, friends, and coworkers that you are quitting. Support from your loved ones can make quitting easier.  Talk with your health care provider about your options for quitting smoking.  Find out what treatment options are covered by your health insurance. WHAT STRATEGIES CAN I USE TO QUIT SMOKING?  Talk with your healthcare provider about different strategies to quit smoking. Some strategies include:  Quitting smoking altogether instead of gradually lessening how much you smoke over a period of time. Research shows that quitting "cold turkey" is more successful than gradually quitting.  Attending in-person counseling to help you build problem-solving skills. You are more likely to have success in quitting if you attend several counseling sessions. Even short sessions of 10 minutes can be effective.  Finding resources and support systems that can help you to quit smoking and remain smoke-free after you quit. These resources are most helpful when you use them often. They can include:  Online chats with a counselor.  Telephone quitlines.  Printed self-help materials.  Support groups or group counseling.  Text messaging programs.  Mobile phone applications.  Taking medicines to help you quit smoking. (If you are  pregnant or breastfeeding, talk with your health care provider first.) Some medicines contain nicotine and some do not. Both types of medicines help with cravings, but the medicines that include nicotine help to relieve withdrawal symptoms. Your health care provider may recommend:  Nicotine patches, gum, or lozenges.  Nicotine inhalers or sprays.  Non-nicotine medicine that is taken by mouth. Talk with your health care provider about combining strategies, such as taking medicines while you are also receiving in-person counseling. Using these two strategies together makes you more likely to succeed in quitting than if you used either strategy on its own. If you are pregnant or breastfeeding, talk with your health care provider about finding counseling or other support strategies to quit smoking. Do not take medicine to help you   quit smoking unless told to do so by your health care provider. WHAT THINGS CAN I DO TO MAKE IT EASIER TO QUIT? Quitting smoking might feel overwhelming at first, but there is a lot that you can do to make it easier. Take these important actions:  Reach out to your family and friends and ask that they support and encourage you during this time. Call telephone quitlines, reach out to support groups, or work with a counselor for support.  Ask people who smoke to avoid smoking around you.  Avoid places that trigger you to smoke, such as bars, parties, or smoke-break areas at work.  Spend time around people who do not smoke.  Lessen stress in your life, because stress can be a smoking trigger for some people. To lessen stress, try:  Exercising regularly.  Deep-breathing exercises.  Yoga.  Meditating.  Performing a body scan. This involves closing your eyes, scanning your body from head to toe, and noticing which parts of your body are particularly tense. Purposefully relax the muscles in those areas.  Download or purchase mobile phone or tablet apps (applications)  that can help you stick to your quit plan by providing reminders, tips, and encouragement. There are many free apps, such as QuitGuide from the CDC (Centers for Disease Control and Prevention). You can find other support for quitting smoking (smoking cessation) through smokefree.gov and other websites. HOW WILL I FEEL WHEN I QUIT SMOKING? Within the first 24 hours of quitting smoking, you may start to feel some withdrawal symptoms. These symptoms are usually most noticeable 2-3 days after quitting, but they usually do not last beyond 2-3 weeks. Changes or symptoms that you might experience include:  Mood swings.  Restlessness, anxiety, or irritation.  Difficulty concentrating.  Dizziness.  Strong cravings for sugary foods in addition to nicotine.  Mild weight gain.  Constipation.  Nausea.  Coughing or a sore throat.  Changes in how your medicines work in your body.  A depressed mood.  Difficulty sleeping (insomnia). After the first 2-3 weeks of quitting, you may start to notice more positive results, such as:  Improved sense of smell and taste.  Decreased coughing and sore throat.  Slower heart rate.  Lower blood pressure.  Clearer skin.  The ability to breathe more easily.  Fewer sick days. Quitting smoking is very challenging for most people. Do not get discouraged if you are not successful the first time. Some people need to make many attempts to quit before they achieve long-term success. Do your best to stick to your quit plan, and talk with your health care provider if you have any questions or concerns.   This information is not intended to replace advice given to you by your health care provider. Make sure you discuss any questions you have with your health care provider.   Document Released: 04/03/2001 Document Revised: 08/24/2014 Document Reviewed: 08/24/2014 Elsevier Interactive Patient Education 2016 Elsevier Inc.  

## 2015-09-26 NOTE — Progress Notes (Signed)
[facility]   CC: Follow up peripheral artery occlusive disease  History of Present Illness [name] is a 55 y.o. male patient of Dr. Trula Slade who is back for follow-up. He initially presented with lifestyle limiting claudication in the right leg as well as a nonhealing wound. His ABI was 0.34 on the right and normal on the left. On 12/28/2014 he was taken to the Cath Lab and underwent angiography. 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 This was successfully recanalized and stented using overlapping 6 mm Cordis self-expanding stents. There is also a significant gradient within the right common iliac artery which was also treated using an 8 x 39 balloon expandable stent. The patient states today that his ulcer healed very quickly after the procedure.He is no longer walking with a cane. He is able to ambulate without significant limitations.  He denies claudication symptoms in either leg or buttocks with walking.   He has applied for disability based on his back issues, arthritis, and DM neuropathy.  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 Stanford Breed is his cardiologist, has one cardiac stent. He states that early CAD runs in his family.   He states that the lesions on his forearms are from poison [place] and on his lower legs are from DM; states all lesions are healing.  He denies any known history of stroke or TIA.  The patient denies New Medical or Surgical History  Pt Diabetic: Yes, states his last A1C was about 8.2, but states since then he has drastically changed his diet and lost weight Pt smoker: smoker  (decreased to 7 cigarettes/day)  Pt meds include: Statin :Yes Betablocker: Yes ASA: Yes Other anticoagulants/antiplatelets: Plavix   Past Medical History  Diagnosis Date  . Hypertension   . Diabetes mellitus   . Hyperlipidemia   . CAD (coronary artery disease)   .  Anxiety   . Colon polyps   . Chronic back pain   . Panic attacks   . Heart attack ([place])   . PVD (peripheral vascular disease) ([place])     Social History Social History  Substance Use Topics  . Smoking status: Current Every Day Smoker -- 0.25 packs/day for 25 years  . Smokeless tobacco: None  . Alcohol Use: No    Family History Family History  Problem Relation Age of Onset  . Diabetes Father   . Kidney failure Father   . Hypertension Father   . Hyperlipidemia Father   . Heart disease Father     Past Surgical History  Procedure Laterality Date  . Coronary angioplasty with stent placement    . Multiple extractions with alveoloplasty  12/10/2011    Procedure: MULTIPLE EXTRACION WITH ALVEOLOPLASTY;  Surgeon: Gae Bon, DDS;  Location: [facility];  Service: Oral Surgery;  Laterality: N/A;  Right Maxillary Tuberosity Reduction; Right  Maxillary Buccal Exostosis; Bilateral Mandibular Tori   . Ear cyst excision  12/10/2011    Procedure: CYST REMOVAL;  Surgeon: Gae Bon, DDS;  Location: [facility];  Service: Oral Surgery;  Laterality: Left;  Left Mandible Cyst Removal  . Peripheral vascular catheterization N/A 12/28/2014    Procedure: Abdominal Aortogram;  Surgeon: Serafina Mitchell, MD;  Location: [facility];  Service: Cardiovascular;  Laterality: N/A;    Allergies  Allergen Reactions  . Codeine Shortness Of Breath and Itching  . Gabapentin Other (See Comments)    Suicidal  thoughts, extreme dreams  . Pregabalin   . Simvastatin Itching  . Venlafaxine   . Hydrocodone Nausea Only and Rash    Breathing problems   . Lisinopril Itching and Rash    Current Outpatient Prescriptions  Medication Sig Dispense Refill  . aspirin 325 MG tablet Take 325 mg by mouth 3 (three) times daily as needed for mild pain.     [name] Kitchen atorvastatin (LIPITOR) 20 MG tablet Take 20 mg by mouth.    . baclofen (LIORESAL) 10 MG tablet Take 10 mg by mouth once a week.     . clopidogrel (PLAVIX) 75 MG tablet  Take 1 tablet (75 mg total) by mouth daily. 30 tablet 3  . Exenatide ER (BYDUREON) 2 MG PEN Inject 2 mg into the skin once a week.     . furosemide (LASIX) 20 MG tablet Take 20 mg by mouth daily as needed for fluid.     [name] Kitchen JANUMET 50-1000 MG per tablet Take 1 tablet by mouth 2 (two) times daily.  0  . losartan (COZAAR) 50 MG tablet Take 1 tablet (50 mg total) by mouth daily. 90 tablet 3  . metFORMIN (GLUCOPHAGE) 1000 MG tablet Take 1,000 mg by mouth 2 (two) times daily.    . metoprolol succinate (TOPROL-XL) 50 MG 24 hr tablet Take 50 mg by mouth daily. Take with or immediately following a meal.    . omeprazole (PRILOSEC) 20 MG capsule Take 1 capsule by mouth daily.    [name] Kitchen oxyCODONE-acetaminophen (PERCOCET) 7.5-325 MG per tablet Take 1 tablet by mouth every 6 (six) hours as needed for severe pain.     No current facility-administered medications for this visit.    ROS: See HPI for pertinent positives and negatives.   Physical Examination  Filed Vitals:   09/26/15 1317  BP: 131/77  Pulse: 70  Temp: 98.4 F (36.9 C)  TempSrc: Oral  Resp: 18  Height: 5\' 7"  (1.702 m)  Weight: 184 lb 4.8 oz (83.598 kg)  SpO2: 97%   Body mass index is 28.86 kg/(m^2).  General: A&O x 3, WDWN. Gait: normal Eyes: PERRLA. Pulmonary: Respirations are non labored, CTAB, no wheezes, rales or rhonchi. Cardiac: regular rhythm, no detected murmur.         Carotid Bruits Right Left   Negative Negative  Aorta is not palpable. Radial pulses: 2+ palpable and =                           VASCULAR EXAM: Extremities without ischemic changes, without Gangrene; without open wounds.                                                                                                          LE Pulses Right Left       FEMORAL  not palpable  faintly palpable        POPLITEAL  not palpable   not palpable       POSTERIOR TIBIAL  not palpable   3+ palpable  DORSALIS PEDIS      ANTERIOR TIBIAL faintly palpable   not palpable    Abdomen: soft, NT, no palpable masses. Skin:  no ulcers noted, multiple healing lesions on lower legs and forearms. Musculoskeletal: no muscle wasting or atrophy.  Neurologic: A&O X 3; Appropriate Affect, sensation is normal except for paresthesia and hyperesthesia in feet and toes; MOTOR FUNCTION:  moving all extremities equally, motor strength 5/5 throughout. Speech is fluent/normal.  CN 2-12 intact.    Non-Invasive Vascular Imaging: DATE: 09/26/2015  Right LE Arterial Duplex: Heterogenous plaque noted in the mid right SFA, proximal to the stent origin with velocities suggestive of a 50-74% stenosis.  Patent right superficial femoral/popliteal artery stent with with elevated velocities and hyperplasia in the distal segment (50-99% by velocity criteria). New finding of distal superficial femoral artery stent stenosis since the last duplex exam of 03/10/15.  ABI:  RIGHT: 0.84 (0.34, 12/20/14), Waveforms: monophasic; TBI: 0.60   LEFT: 0.93 (1.04), Waveforms: biphasic PT, monophasic DP; TBI: 0.69   ASSESSMENT: [name] is a 55 y.o. male who is s/p right CIA stent placement, and right popliteal artery/SFA stent placement on 12/28/14. He no longer has claudication symptoms in either lower extremity with walking, no signs of ischemia in his feet/legs.   Today's right lower extremity arterial duplex suggests heterogenous plaque noted in the mid right SFA, proximal to the stent origin with velocities suggestive of a 50-74% stenosis.  Patent right superficial femoral/popliteal artery stent with with elevated velocities and hyperplasia in the distal segment (50-99% by velocity criteria). New finding of distal superficial femoral artery stent stenosis since the last duplex exam of 03/10/15. Today's ABI's are the first post stenting ABI's as Medicaid would not cover ABI's at pt's last visit on 03/21/15, but did cover right LE arterial duplex. Right ABI today is 84% compared to  pre-stent ABI of 34%, monophasic waveforms. Left ABI today has decreased slightly to 93% from 100%, biphasic and monophasic waveforms.   His atherosclerotic risk factors include previously uncontrolled DM and long term smoking. He indicates that he has made drastic changes to his diet, has lost a significant amount of weight, and his home glucose checks have improved a great deal since his last A1C of about 8.2.  He has also decreased his smoking. The patient was counseled re smoking cessation and given several free resources re smoking cessation.  His walking is limited by his arthritis pain and back pain. He states he will no longer have Medicaid by the end of this month,  has no other health insurance.  He is hoping to have disability status as soon as possible. I discussed with Dr. Trula Slade results today of pt's arterial duplex and no claudication symptoms, no signs of ischemia in his feet/legs.    PLAN:  The patient was counseled re smoking cessation and given several free resources re smoking cessation.   Based on the patient's vascular studies and examination, and after discussing with Dr. Trula Slade, pt will return to clinic in 3 months with right lower extremity duplex; if his insurance will not cover right LE extremity duplex, will try to get limited duplex of the distal SFA stent; Medicaid denied coverage of ABI's at his last visit but did seem to cover right LE arterial duplex.  I discussed in depth with the patient the nature of atherosclerosis, and emphasized the importance of maximal medical management including strict control of blood pressure, blood glucose, and lipid levels, obtaining regular exercise, and cessation  of smoking.  The patient is aware that without maximal medical management the underlying atherosclerotic disease process will progress, limiting the benefit of any interventions.  The patient was given information about PAD including signs, symptoms, treatment, what  symptoms should prompt the patient to seek immediate medical care, and risk reduction measures to take.  Clemon Chambers, RN, MSN, FNP-C [facility] Phone: [phone]  Clinic MD: Trula Slade  09/26/2015 1:49 PM

## 2015-11-18 NOTE — Addendum Note (Signed)
Addended by: Reola Calkins on: 11/18/2015 02:37 PM   Modules accepted: Orders

## 2015-12-06 ENCOUNTER — Encounter: Payer: Self-pay | Admitting: Surgery

## 2015-12-08 ENCOUNTER — Ambulatory Visit (HOSPITAL_COMMUNITY)
Admission: RE | Admit: 2015-12-08 | Discharge: 2015-12-08 | Disposition: A | Discharge status: Home / Self Care | Payer: Medicaid Other | Source: Ambulatory Visit | Attending: Surgery | Admitting: Surgery

## 2015-12-08 ENCOUNTER — Other Ambulatory Visit: Payer: Self-pay | Admitting: Family

## 2015-12-08 DIAGNOSIS — I70209 Unspecified atherosclerosis of native arteries of extremities, unspecified extremity: Secondary | ICD-10-CM

## 2015-12-08 DIAGNOSIS — Z48812 Encounter for surgical aftercare following surgery on the circulatory system: Secondary | ICD-10-CM | POA: Diagnosis not present

## 2015-12-08 DIAGNOSIS — I771 Stricture of artery: Secondary | ICD-10-CM

## 2015-12-08 DIAGNOSIS — I70201 Unspecified atherosclerosis of native arteries of extremities, right leg: Secondary | ICD-10-CM | POA: Insufficient documentation

## 2015-12-08 DIAGNOSIS — Z4889 Encounter for other specified surgical aftercare: Secondary | ICD-10-CM

## 2015-12-08 DIAGNOSIS — Z95828 Presence of other vascular implants and grafts: Secondary | ICD-10-CM | POA: Diagnosis not present

## 2015-12-08 DIAGNOSIS — I739 Peripheral vascular disease, unspecified: Secondary | ICD-10-CM

## 2015-12-08 DIAGNOSIS — Z789 Other specified health status: Secondary | ICD-10-CM

## 2015-12-08 LAB — VAS US LOWER EXTREMITY ARTERIAL DUPLEX
RSFMPSV: -213 cm/s
Right popliteal dist sys PSV: -81 cm/s
Right super femoral prox sys PSV: 106 cm/s

## 2015-12-12 ENCOUNTER — Other Ambulatory Visit: Payer: Self-pay

## 2015-12-12 ENCOUNTER — Ambulatory Visit (INDEPENDENT_AMBULATORY_CARE_PROVIDER_SITE_OTHER): Payer: Self-pay | Admitting: Surgery

## 2015-12-12 ENCOUNTER — Encounter: Payer: Self-pay | Admitting: Surgery

## 2015-12-12 DIAGNOSIS — Z95828 Presence of other vascular implants and grafts: Secondary | ICD-10-CM

## 2015-12-12 DIAGNOSIS — Z789 Other specified health status: Secondary | ICD-10-CM

## 2015-12-12 DIAGNOSIS — I739 Peripheral vascular disease, unspecified: Secondary | ICD-10-CM

## 2015-12-12 MED ORDER — CLOPIDOGREL BISULFATE 75 MG PO TABS: 75.0000 mg | ORAL_TABLET | Freq: Every day | ORAL | 6 refills | Status: DC

## 2015-12-12 NOTE — Progress Notes (Signed)
Vascular and Vein Specialist of [facility]  Patient name: [name] MRN: [id number] DOB: 04/21/1961 Sex: male  REASON FOR VISIT: follow up  HPI: the patient is back for follow-up. He initially presented with lifestyle limiting claudication in the right leg as well as a nonhealing wound. His ABI was 0.34 on the right and normal on the left. On 12/28/2014 he was taken to the Cath Lab and underwent angiography.  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  This was successfully recanalized and stented using overlapping 6 mm Cordis self-expanding stents.  There is also a significant gradient within the right common iliac artery which was also treated using an 8 x 39 balloon expandable stent.  The patient states today that his ulcer healed very quickly after the procedure.  He is no longer walking with a cane.  He is able to ambulate without significant limitations.  He takes his Plavix occasionally but not routinely.  At his last appointment, he had elevation of the velocity profile within his right leg.  He is back today for repeat evaluation.  He is now noticing claudication symptoms in his right leg with walking approximately 50 feet.  There are no open wounds.  He has had difficulty with taking his medication and payment.  Past Medical History:  Diagnosis Date  . Anxiety   . CAD (coronary artery disease)   . Chronic back pain   . Colon polyps   . Diabetes mellitus   . Heart attack ([place])   . Hyperlipidemia   . Hypertension   . Panic attacks   . PVD (peripheral vascular disease) (HCC)     Family History  Problem Relation Age of Onset  . Diabetes Father   . Kidney failure Father   . Hypertension Father   . Hyperlipidemia Father   . Heart disease Father     SOCIAL HISTORY: Social History  Substance Use Topics  . Smoking status: Current Every Day Smoker    Packs/day: 0.25    Years: 25.00  . Smokeless  tobacco: Not on file  . Alcohol use No    Allergies  Allergen Reactions  . Codeine Shortness Of Breath and Itching  . Gabapentin Other (See Comments)    Suicidal thoughts, extreme dreams  . Pregabalin   . Simvastatin Itching  . Venlafaxine   . Hydrocodone Nausea Only and Rash    Breathing problems   . Lisinopril Itching and Rash    Current Outpatient Prescriptions  Medication Sig Dispense Refill  . aspirin 325 MG tablet Take 325 mg by mouth 3 (three) times daily as needed for mild pain.     . baclofen (LIORESAL) 10 MG tablet Take 10 mg by mouth once a week.     . clopidogrel (PLAVIX) 75 MG tablet Take 1 tablet (75 mg total) by mouth daily. 30 tablet 3  . Exenatide ER (BYDUREON) 2 MG PEN Inject 2 mg into the skin once a week.     . furosemide (LASIX) 20 MG tablet Take 20 mg by mouth daily as needed for fluid.     [name] Kitchen JANUMET 50-1000 MG per tablet Take 1 tablet by mouth 2 (two) times daily.  0  . losartan (COZAAR) 50 MG tablet Take 1 tablet (50 mg total) by mouth daily. 90 tablet 3  . metFORMIN (GLUCOPHAGE) 1000 MG tablet Take 1,000 mg by mouth 2 (two) times daily.    . metoprolol succinate (TOPROL-XL) 50 MG 24 hr tablet Take 50  mg by mouth daily. Take with or immediately following a meal.    . omeprazole (PRILOSEC) 20 MG capsule Take 1 capsule by mouth daily.    [name] Kitchen oxyCODONE-acetaminophen (PERCOCET) 7.5-325 MG per tablet Take 1 tablet by mouth every 6 (six) hours as needed for severe pain.     No current facility-administered medications for this visit.     REVIEW OF SYSTEMS:  [X]  denotes positive finding, [ ]  denotes negative finding Cardiac  Comments:  Chest pain or chest pressure:    Shortness of breath upon exertion:    Short of breath when lying flat:    Irregular heart rhythm:        Vascular    Pain in calf, thigh, or hip brought on by ambulation: x   Pain in feet at night that wakes you up from your sleep:     Blood clot in your veins:    Leg swelling:           Pulmonary    Oxygen at home:    Productive cough:     Wheezing:         Neurologic    Sudden weakness in arms or legs:     Sudden numbness in arms or legs:     Sudden onset of difficulty speaking or slurred speech:    Temporary loss of vision in one eye:     Problems with dizziness:         Gastrointestinal    Blood in stool:     Vomited blood:         Genitourinary    Burning when urinating:     Blood in urine:        Psychiatric    Major depression:         Hematologic    Bleeding problems:    Problems with blood clotting too easily:        Skin    Rashes or ulcers:        Constitutional    Fever or chills:      PHYSICAL EXAM: Vitals:   12/12/15 1105  BP: (!) 188/96  Pulse: (!) 54  Resp: 16  Temp: 97.6 F (36.4 C)  TempSrc: Oral  SpO2: 97%  Weight: 185 lb (83.9 kg)  Height: 5\' 7"  (1.702 m)    GENERAL: The patie nt is a well-nourished male, in no acute distress. The vital signs are documented above. CARDIAC: There is a regular rate and rhythm.  PULMONARY: There is good air exchange bilaterally without wheezing or rales. MUSCULOSKELETAL: There are no major deformities or cyanosis. NEUROLOGIC: No focal weakness or paresthesias are detected. SKIN: There are no ulcers or rashes noted. PSYCHIATRIC: The patient has a normal affect.  DATA:  I have ordered and reviewed his duplex.  The ABI on the right is not calculated.  There are elevated velocities within the distal right leg stent.  MEDICAL ISSUES: Atherosclerosis with claudication: The patient's ultrasound today shows slight progression of the stenosis within his stent.  What is more troubling is the fact that his symptoms appear to be getting worse.  Therefore I have recommended proceeding with aortogram via a left femoral cannulation, bilateral runoff, and intervention on the restenosis in the right leg.  I have him scheduled for this Tuesday, August 29.  Risks and benefits of the procedure were discussed  with the patient.  All questions were answered.  I did give him a refill for his Plavix today.  He  was instructed to change from omeprazole to Zantac.    [name], MD Vascular and Vein Specialists of [facility] [phone] Pager [phone]

## 2015-12-20 ENCOUNTER — Other Ambulatory Visit: Payer: Self-pay | Admitting: *Deleted

## 2015-12-20 ENCOUNTER — Ambulatory Visit (HOSPITAL_COMMUNITY)
Admission: RE | Admit: 2015-12-20 | Discharge: 2015-12-20 | Disposition: A | Discharge status: Home / Self Care | Payer: Medicaid Other | Source: Ambulatory Visit | Attending: Surgery | Admitting: Surgery

## 2015-12-20 ENCOUNTER — Ambulatory Visit (HOSPITAL_COMMUNITY)
Admission: RE | Disposition: A | Discharge status: Home / Self Care | Payer: Self-pay | Source: Ambulatory Visit | Attending: Surgery

## 2015-12-20 DIAGNOSIS — Z7902 Long term (current) use of antithrombotics/antiplatelets: Secondary | ICD-10-CM | POA: Insufficient documentation

## 2015-12-20 DIAGNOSIS — T82856A Stenosis of peripheral vascular stent, initial encounter: Secondary | ICD-10-CM | POA: Insufficient documentation

## 2015-12-20 DIAGNOSIS — G8929 Other chronic pain: Secondary | ICD-10-CM | POA: Insufficient documentation

## 2015-12-20 DIAGNOSIS — F17210 Nicotine dependence, cigarettes, uncomplicated: Secondary | ICD-10-CM | POA: Diagnosis not present

## 2015-12-20 DIAGNOSIS — I701 Atherosclerosis of renal artery: Secondary | ICD-10-CM | POA: Insufficient documentation

## 2015-12-20 DIAGNOSIS — Z79899 Other long term (current) drug therapy: Secondary | ICD-10-CM | POA: Insufficient documentation

## 2015-12-20 DIAGNOSIS — F419 Anxiety disorder, unspecified: Secondary | ICD-10-CM | POA: Diagnosis not present

## 2015-12-20 DIAGNOSIS — I2510 Atherosclerotic heart disease of native coronary artery without angina pectoris: Secondary | ICD-10-CM | POA: Insufficient documentation

## 2015-12-20 DIAGNOSIS — I70211 Atherosclerosis of native arteries of extremities with intermittent claudication, right leg: Secondary | ICD-10-CM | POA: Insufficient documentation

## 2015-12-20 DIAGNOSIS — Y838 Other surgical procedures as the cause of abnormal reaction of the patient, or of later complication, without mention of misadventure at the time of the procedure: Secondary | ICD-10-CM | POA: Diagnosis not present

## 2015-12-20 DIAGNOSIS — Z7984 Long term (current) use of oral hypoglycemic drugs: Secondary | ICD-10-CM | POA: Diagnosis not present

## 2015-12-20 DIAGNOSIS — I739 Peripheral vascular disease, unspecified: Secondary | ICD-10-CM

## 2015-12-20 DIAGNOSIS — E119 Type 2 diabetes mellitus without complications: Secondary | ICD-10-CM | POA: Diagnosis not present

## 2015-12-20 DIAGNOSIS — I10 Essential (primary) hypertension: Secondary | ICD-10-CM | POA: Diagnosis not present

## 2015-12-20 DIAGNOSIS — E785 Hyperlipidemia, unspecified: Secondary | ICD-10-CM | POA: Diagnosis not present

## 2015-12-20 DIAGNOSIS — I252 Old myocardial infarction: Secondary | ICD-10-CM | POA: Insufficient documentation

## 2015-12-20 DIAGNOSIS — Z7982 Long term (current) use of aspirin: Secondary | ICD-10-CM | POA: Diagnosis not present

## 2015-12-20 DIAGNOSIS — Z9862 Peripheral vascular angioplasty status: Secondary | ICD-10-CM

## 2015-12-20 HISTORY — PX: PERIPHERAL VASCULAR CATHETERIZATION: SHX172C

## 2015-12-20 LAB — POCT I-STAT, CHEM 8
BUN: 17 mg/dL (ref 6–20)
CALCIUM ION: 1.22 mmol/L (ref 1.15–1.40)
Chloride: 102 mmol/L (ref 101–111)
Creatinine, Ser: 0.8 mg/dL (ref 0.61–1.24)
Glucose, Bld: 146 mg/dL — ABNORMAL HIGH (ref 65–99)
HEMATOCRIT: 48 % (ref 39.0–52.0)
Hemoglobin: 16.3 g/dL (ref 13.0–17.0)
Potassium: 4.3 mmol/L (ref 3.5–5.1)
SODIUM: 139 mmol/L (ref 135–145)
TCO2: 24 mmol/L (ref 0–100)

## 2015-12-20 LAB — POCT ACTIVATED CLOTTING TIME
Activated Clotting Time: 219 seconds
Activated Clotting Time: 169 seconds

## 2015-12-20 LAB — GLUCOSE, CAPILLARY: Glucose-Capillary: 112 mg/dL — ABNORMAL HIGH (ref 65–99)

## 2015-12-20 SURGERY — ABDOMINAL AORTOGRAM
Anesthesia: LOCAL

## 2015-12-20 MED ORDER — FENTANYL CITRATE (PF) 100 MCG/2ML IJ SOLN
INTRAMUSCULAR | Status: DC | PRN
  Administered 2015-12-20: 25 ug via INTRAVENOUS
  Administered 2015-12-20: 50 ug via INTRAVENOUS

## 2015-12-20 MED ORDER — MIDAZOLAM HCL 2 MG/2ML IJ SOLN
INTRAMUSCULAR | Status: AC
  Filled 2015-12-20: qty 2

## 2015-12-20 MED ORDER — HEPARIN (PORCINE) IN NACL 2-0.9 UNIT/ML-% IJ SOLN
INTRAMUSCULAR | Status: AC
  Filled 2015-12-20: qty 500

## 2015-12-20 MED ORDER — MIDAZOLAM HCL 2 MG/2ML IJ SOLN
INTRAMUSCULAR | Status: DC | PRN
  Administered 2015-12-20: 1 mg via INTRAVENOUS
  Administered 2015-12-20: 2 mg via INTRAVENOUS

## 2015-12-20 MED ORDER — SODIUM CHLORIDE 0.9 % IV SOLN
INTRAVENOUS | Status: DC
  Administered 2015-12-20: 07:00:00 via INTRAVENOUS

## 2015-12-20 MED ORDER — LIDOCAINE HCL (PF) 1 % IJ SOLN
INTRAMUSCULAR | Status: AC
  Filled 2015-12-20: qty 30

## 2015-12-20 MED ORDER — HYDRALAZINE HCL 20 MG/ML IJ SOLN: 5.0000 mg | INTRAMUSCULAR | Status: DC | PRN

## 2015-12-20 MED ORDER — ACETAMINOPHEN 325 MG RE SUPP
325.0000 mg | RECTAL | Status: DC | PRN
  Filled 2015-12-20: qty 2

## 2015-12-20 MED ORDER — ACETAMINOPHEN 325 MG PO TABS
325.0000 mg | ORAL_TABLET | ORAL | Status: DC | PRN
  Filled 2015-12-20: qty 2

## 2015-12-20 MED ORDER — METOPROLOL TARTRATE 5 MG/5ML IV SOLN: 2.0000 mg | INTRAVENOUS | Status: DC | PRN

## 2015-12-20 MED ORDER — SODIUM CHLORIDE 0.9 % IV SOLN: 1.0000 mL/kg/h | INTRAVENOUS | Status: DC

## 2015-12-20 MED ORDER — LIDOCAINE HCL (PF) 1 % IJ SOLN
INTRAMUSCULAR | Status: DC | PRN
Start: 2015-12-20 — End: 2015-12-20
  Administered 2015-12-20: 14 mL

## 2015-12-20 MED ORDER — GUAIFENESIN-DM 100-10 MG/5ML PO SYRP
15.0000 mL | ORAL_SOLUTION | ORAL | Status: DC | PRN
  Filled 2015-12-20: qty 15

## 2015-12-20 MED ORDER — PHENOL 1.4 % MT LIQD
1.0000 | OROMUCOSAL | Status: DC | PRN
  Filled 2015-12-20: qty 177

## 2015-12-20 MED ORDER — IODIXANOL 320 MG/ML IV SOLN
INTRAVENOUS | Status: DC | PRN
  Administered 2015-12-20: 110 mL via INTRA_ARTERIAL

## 2015-12-20 MED ORDER — MORPHINE SULFATE (PF) 10 MG/ML IV SOLN
2.0000 mg | INTRAVENOUS | Status: DC | PRN
  Administered 2015-12-20: 2 mg via INTRAVENOUS

## 2015-12-20 MED ORDER — HEPARIN (PORCINE) IN NACL 2-0.9 UNIT/ML-% IJ SOLN
INTRAMUSCULAR | Status: DC | PRN
  Administered 2015-12-20: 1000 mL via INTRA_ARTERIAL

## 2015-12-20 MED ORDER — HEPARIN SODIUM (PORCINE) 1000 UNIT/ML IJ SOLN
INTRAMUSCULAR | Status: DC | PRN
  Administered 2015-12-20: 8000 [IU] via INTRAVENOUS

## 2015-12-20 MED ORDER — ALUM & MAG HYDROXIDE-SIMETH 200-200-20 MG/5ML PO SUSP
15.0000 mL | ORAL | Status: DC | PRN
  Filled 2015-12-20: qty 30

## 2015-12-20 MED ORDER — MORPHINE SULFATE (PF) 2 MG/ML IV SOLN
INTRAVENOUS | Status: AC
  Filled 2015-12-20: qty 1

## 2015-12-20 MED ORDER — FENTANYL CITRATE (PF) 100 MCG/2ML IJ SOLN
INTRAMUSCULAR | Status: AC
  Filled 2015-12-20: qty 2

## 2015-12-20 MED ORDER — DOCUSATE SODIUM 100 MG PO CAPS: 100.0000 mg | ORAL_CAPSULE | Freq: Every day | ORAL | Status: DC

## 2015-12-20 MED ORDER — LABETALOL HCL 5 MG/ML IV SOLN: 10.0000 mg | INTRAVENOUS | Status: DC | PRN

## 2015-12-20 MED ORDER — ONDANSETRON HCL 4 MG/2ML IJ SOLN: 4.0000 mg | Freq: Four times a day (QID) | INTRAMUSCULAR | Status: DC | PRN

## 2015-12-20 SURGICAL SUPPLY — 15 items
BALLN LUTONIX 5X120X130 (BALLOONS) ×3
BALLOON LUTONIX 5X120X130 (BALLOONS) IMPLANT
CATH OMNI FLUSH 5F 65CM (CATHETERS) ×1 IMPLANT
DEVICE CONTINUOUS FLUSH (MISCELLANEOUS) ×1 IMPLANT
DRAPE ZERO GRAVITY STERILE (DRAPES) ×1 IMPLANT
KIT ENCORE 26 ADVANTAGE (KITS) ×1 IMPLANT
KIT MICROINTRODUCER STIFF 5F (SHEATH) ×1 IMPLANT
KIT PV (KITS) ×3 IMPLANT
SHEATH DESTINATION MP 5FR 45CM (SHEATH) ×1 IMPLANT
SHEATH PINNACLE 5F 10CM (SHEATH) ×1 IMPLANT
SYR MEDRAD MARK V 150ML (SYRINGE) ×3 IMPLANT
TRANSDUCER W/STOPCOCK (MISCELLANEOUS) ×3 IMPLANT
TRAY PV CATH (CUSTOM PROCEDURE TRAY) ×3 IMPLANT
WIRE BENTSON .035X145CM (WIRE) ×1 IMPLANT
WIRE HI TORQ VERSACORE J 260CM (WIRE) ×2 IMPLANT

## 2015-12-20 NOTE — Progress Notes (Signed)
Assumed care of pt from D. Hedge, RN. Assessment unchanged.

## 2015-12-20 NOTE — Progress Notes (Signed)
Sheath removed from RFA. Pt tolerated well, hemostasis easily achieved. No hematoma, no eccymosis. Pt. Given instrustions if laugh,cough, sneeze or bear down to hold pressure. Pt verbally acknowledges understanding as he repeats them back to me. Pt. Also advised he is going home and has family to stay with him over night. Care transferred to C. Ouida Sills, RN for transport to [facility].

## 2015-12-20 NOTE — Interval H&P Note (Signed)
History and Physical Interval Note:  12/20/2015 8:20 AM  [name]  has presented today for surgery, with the diagnosis of pvd  The various methods of treatment have been discussed with the patient and family. After consideration of risks, benefits and other options for treatment, the patient has consented to  Procedure(s): Abdominal Aortogram (N/A) as a surgical intervention .  The patient's history has been reviewed, patient examined, no change in status, stable for surgery.  I have reviewed the patient's chart and labs.  Questions were answered to the patient's satisfaction.     [name]

## 2015-12-20 NOTE — H&P (View-Only) (Signed)
Vascular and Vein Specialist of [facility]  Patient name: [name] MRN: [id number] DOB: [birth year] Sex: male  REASON FOR VISIT: follow up  HPI: the patient is back for follow-up. He initially presented with lifestyle limiting claudication in the right leg as well as a nonhealing wound. His ABI was 0.34 on the right and normal on the left. On 12/28/2014 he was taken to the Cath Lab and underwent angiography.  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  This was successfully recanalized and stented using overlapping 6 mm Cordis self-expanding stents.  There is also a significant gradient within the right common iliac artery which was also treated using an 8 x 39 balloon expandable stent.  The patient states today that his ulcer healed very quickly after the procedure.  He is no longer walking with a cane.  He is able to ambulate without significant limitations.  He takes his Plavix occasionally but not routinely.  At his last appointment, he had elevation of the velocity profile within his right leg.  He is back today for repeat evaluation.  He is now noticing claudication symptoms in his right leg with walking approximately 50 feet.  There are no open wounds.  He has had difficulty with taking his medication and payment.  Past Medical History:  Diagnosis Date  . Anxiety   . CAD (coronary artery disease)   . Chronic back pain   . Colon polyps   . Diabetes mellitus   . Heart attack ([place])   . Hyperlipidemia   . Hypertension   . Panic attacks   . PVD (peripheral vascular disease) (HCC)     Family History  Problem Relation Age of Onset  . Diabetes Father   . Kidney failure Father   . Hypertension Father   . Hyperlipidemia Father   . Heart disease Father     SOCIAL HISTORY: Social History  Substance Use Topics  . Smoking status: Current Every Day Smoker    Packs/day: 0.25    Years: 25.00  . Smokeless  tobacco: Not on file  . Alcohol use No    Allergies  Allergen Reactions  . Codeine Shortness Of Breath and Itching  . Gabapentin Other (See Comments)    Suicidal thoughts, extreme dreams  . Pregabalin   . Simvastatin Itching  . Venlafaxine   . Hydrocodone Nausea Only and Rash    Breathing problems   . Lisinopril Itching and Rash    Current Outpatient Prescriptions  Medication Sig Dispense Refill  . aspirin 325 MG tablet Take 325 mg by mouth 3 (three) times daily as needed for mild pain.     . baclofen (LIORESAL) 10 MG tablet Take 10 mg by mouth once a week.     . clopidogrel (PLAVIX) 75 MG tablet Take 1 tablet (75 mg total) by mouth daily. 30 tablet 3  . Exenatide ER (BYDUREON) 2 MG PEN Inject 2 mg into the skin once a week.     . furosemide (LASIX) 20 MG tablet Take 20 mg by mouth daily as needed for fluid.     [name] Kitchen JANUMET 50-1000 MG per tablet Take 1 tablet by mouth 2 (two) times daily.  0  . losartan (COZAAR) 50 MG tablet Take 1 tablet (50 mg total) by mouth daily. 90 tablet 3  . metFORMIN (GLUCOPHAGE) 1000 MG tablet Take 1,000 mg by mouth 2 (two) times daily.    . metoprolol succinate (TOPROL-XL) 50 MG 24 hr tablet Take 50  mg by mouth daily. Take with or immediately following a meal.    . omeprazole (PRILOSEC) 20 MG capsule Take 1 capsule by mouth daily.    [name] Kitchen oxyCODONE-acetaminophen (PERCOCET) 7.5-325 MG per tablet Take 1 tablet by mouth every 6 (six) hours as needed for severe pain.     No current facility-administered medications for this visit.     REVIEW OF SYSTEMS:  [X]  denotes positive finding, [ ]  denotes negative finding Cardiac  Comments:  Chest pain or chest pressure:    Shortness of breath upon exertion:    Short of breath when lying flat:    Irregular heart rhythm:        Vascular    Pain in calf, thigh, or hip brought on by ambulation: x   Pain in feet at night that wakes you up from your sleep:     Blood clot in your veins:    Leg swelling:           Pulmonary    Oxygen at home:    Productive cough:     Wheezing:         Neurologic    Sudden weakness in arms or legs:     Sudden numbness in arms or legs:     Sudden onset of difficulty speaking or slurred speech:    Temporary loss of vision in one eye:     Problems with dizziness:         Gastrointestinal    Blood in stool:     Vomited blood:         Genitourinary    Burning when urinating:     Blood in urine:        Psychiatric    Major depression:         Hematologic    Bleeding problems:    Problems with blood clotting too easily:        Skin    Rashes or ulcers:        Constitutional    Fever or chills:      PHYSICAL EXAM: Vitals:   12/12/15 1105  BP: (!) 188/96  Pulse: (!) 54  Resp: 16  Temp: 97.6 F (36.4 C)  TempSrc: Oral  SpO2: 97%  Weight: 185 lb (83.9 kg)  Height: 5\' 7"  (1.702 m)    GENERAL: The patie nt is a well-nourished male, in no acute distress. The vital signs are documented above. CARDIAC: There is a regular rate and rhythm.  PULMONARY: There is good air exchange bilaterally without wheezing or rales. MUSCULOSKELETAL: There are no major deformities or cyanosis. NEUROLOGIC: No focal weakness or paresthesias are detected. SKIN: There are no ulcers or rashes noted. PSYCHIATRIC: The patient has a normal affect.  DATA:  I have ordered and reviewed his duplex.  The ABI on the right is not calculated.  There are elevated velocities within the distal right leg stent.  MEDICAL ISSUES: Atherosclerosis with claudication: The patient's ultrasound today shows slight progression of the stenosis within his stent.  What is more troubling is the fact that his symptoms appear to be getting worse.  Therefore I have recommended proceeding with aortogram via a left femoral cannulation, bilateral runoff, and intervention on the restenosis in the right leg.  I have him scheduled for this Tuesday, August 29.  Risks and benefits of the procedure were discussed  with the patient.  All questions were answered.  I did give him a refill for his Plavix today.  He  was instructed to change from omeprazole to Zantac.    [name], MD Vascular and Vein Specialists of [facility] [phone] Pager [phone]

## 2015-12-20 NOTE — Op Note (Signed)
Patient name: [name] MRN: [id number] DOB: [birth year] Sex: male  12/20/2015 Pre-operative Diagnosis: End stent stenosis Post-operative diagnosis:  Same Surgeon:  [name] Procedure Performed:  1.  Ultrasound-guided access, left femoral artery  2.  Abdominal aortogram  3.  Right lower extremity runoff  4.  Drug coated balloon angioplasty, right superficial femoral artery  5.  Conscious sedation (10:35-11:27)    Indications:  The patient is previously undergone recanalization of an occluded popliteal artery and subsequent stenting for wound healing.  He has healed his wound but is now having recurrent claudication symptoms.  He is here for further evaluation.  Procedure:  The patient was identified in the holding area and taken to room 8.  The patient was then placed supine on the table and prepped and draped in the usual sterile fashion.  A time out was called.  Conscious sedation was performed with the use of IV fentanyl and Versed under continuous monitoring by the physician and circulating nurse.  Heart rate blood pressure and oxygen saturations were continuously monitored.  Ultrasound was used to evaluate the left common femoral artery.  It was patent .  A digital ultrasound image was acquired.  A micropuncture needle was used to access the left common femoral artery under ultrasound guidance.  An 018 wire was advanced without resistance and a micropuncture sheath was placed.  The 018 wire was removed and a benson wire was placed.  The micropuncture sheath was exchanged for a 5 french sheath.  An omniflush catheter was advanced over the wire to the level of L-1.  An abdominal angiogram was obtained.  Next, using the omniflush catheter and a benson wire, the aortic bifurcation was crossed and the catheter was placed into theright external iliac artery and right runoff was obtained.    Findings:   Aortogram:  Right renal artery stenosis.  Patent infrarenal abdominal aorta.   30-40% stenosis at the origin of the left common iliac artery.  The right common and external iliac artery are widely patent.  Left external iliac artery is patent.  Right Lower Extremity:  The right common femoral profunda femoral artery are patent throughout their course.  In the mid superficial femoral artery, there is approximately 40-50% stenosis.  A stent is visualized within the above-knee popliteal artery going down to the joint space.  There are several focal areas of narrowing approximately 70%.  There is two-vessel runoff via the posterior tibial and peroneal artery  Left Lower Extremity:  Not evaluated  Intervention:  After the above images were acquired, the decision was made to proceed with intervention.  Over a 035 wire, a 5 French 45 cm sheath was advanced into the right external iliac artery.  The patient was fully heparinized.  A 035 woolly wire was then advanced into the peroneal artery.  A 5 x 1 20 Lutonix drug coated balloon was then inserted and taken 14 atm for 2 minutes to treat the in-stent stenosis within the right popliteal stent.  Completion imaging revealed resolution of the stenosis to less than 5%.  Runoff was unchanged.  Catheters and wires were removed.  The patient taken the holding area for sheath pull once her coagulation profile corrects.  Impression:  #1  right renal artery stenosis  #2  several focal moderate areas of stenosis within the stent of the right popliteal artery successfully treated with drug coated balloon angioplasty using a 5 mm balloon.    Theotis Burrow, M.D. Vascular and Vein  Specialists of [place] Office: [phone] Pager:  [phone]

## 2015-12-20 NOTE — Progress Notes (Signed)
Assumed care of pt from [facility] assessment unchanged

## 2015-12-20 NOTE — Discharge Instructions (Signed)
Angiogram, Care After Refer to this sheet in the next few weeks. These instructions provide you with information about caring for yourself after your procedure. Your health care provider may also give you more specific instructions. Your treatment has been planned according to current medical practices, but problems sometimes occur. Call your health care provider if you have any problems or questions after your procedure. WHAT TO EXPECT AFTER THE PROCEDURE After your procedure, it is typical to have the following:  Bruising at the catheter insertion site that usually fades within 1-2 weeks.  Blood collecting in the tissue (hematoma) that may be painful to the touch. It should usually decrease in size and tenderness within 1-2 weeks. HOME CARE INSTRUCTIONS  Take medicines only as directed by your health care provider.  You may shower 24-48 hours after the procedure or as directed by your health care provider. Remove the bandage (dressing) and gently wash the site with plain soap and water. Pat the area dry with a clean towel. Do not rub the site, because this may cause bleeding.  Do not take baths, swim, or use a hot tub until your health care provider approves.  Check your insertion site every day for redness, swelling, or drainage.  Do not apply powder or lotion to the site.  Do not lift over 10 lb (4.5 kg) for 5 days after your procedure or as directed by your health care provider.  Ask your health care provider when it is okay to:  Return to work or school.  Resume usual physical activities or sports.  Resume sexual activity.  Do not drive home if you are discharged the same day as the procedure. Have someone else drive you.  You may drive 24 hours after the procedure unless otherwise instructed by your health care provider.  Do not operate machinery or power tools for 24 hours after the procedure or as directed by your health care provider.  If your procedure was done as an  outpatient procedure, which means that you went home the same day as your procedure, a responsible adult should be with you for the first 24 hours after you arrive home.  Keep all follow-up visits as directed by your health care provider. This is important. SEEK MEDICAL CARE IF:  You have a fever.  You have chills.  You have increased bleeding from the catheter insertion site. Hold pressure on the site. SEEK IMMEDIATE MEDICAL CARE IF:  You have unusual pain at the catheter insertion site.  You have redness, warmth, or swelling at the catheter insertion site.  You have drainage (other than a small amount of blood on the dressing) from the catheter insertion site.  The catheter insertion site is bleeding, and the bleeding does not stop after 30 minutes of holding steady pressure on the site.  The area near or just beyond the catheter insertion site becomes pale, cool, tingly, or numb.   This information is not intended to replace advice given to you by your health care provider. Make sure you discuss any questions you have with your health care provider.   Document Released: 10/26/2004 Document Revised: 04/30/2014 Document Reviewed: 09/10/2012 Elsevier Interactive Patient Education 2016 [place]

## 2015-12-20 NOTE — Progress Notes (Signed)
Client states know to hold metformin for 2 days

## 2015-12-21 ENCOUNTER — Encounter (HOSPITAL_COMMUNITY): Payer: Self-pay | Admitting: Surgery

## 2015-12-21 MED FILL — Morphine Sulfate Inj 2 MG/ML: INTRAMUSCULAR | Qty: 1 | Status: AC

## 2016-01-03 ENCOUNTER — Telehealth: Payer: Self-pay

## 2016-01-03 NOTE — Telephone Encounter (Signed)
Faxed letter of medical necessity to Dalbert Mayotte, Case Manager @ [facility], [place] [phone], per pt. request, under the guidance of Dr. Trula Slade.

## 2016-01-03 NOTE — Telephone Encounter (Signed)
-----   Message from Viann Fish, NP sent at 01/02/2016  5:06 PM EDT ----- Regarding: FW: letter of medical necessity Arbie Cookey, I hope this helps. Thank you, Vinnie Level  ----- Message ----- From: Serafina Mitchell, MD Sent: 01/01/2016   9:32 PM To: Sharmon Leyden Nickel, NP Subject: RE: letter of medical necessity                He just had an intervention for recurrent stenosis in his leg.  This should be enough evidence to support routine surveillance.  ----- Message ----- From: Viann Fish, NP Sent: 11/30/2015   7:26 PM To: Serafina Mitchell, MD, Denman George, RN Subject: FW: letter of medical necessity                Dr. Trula Slade, Please weigh in re this. Thank you, Vinnie Level  ----- Message ----- From: Denman George, RN Sent: 11/30/2015  12:52 PM To: Sharmon Leyden Nickel, NP Subject: letter of medical necessity                    You saw this pt. in June of this year.  We follow him for PVD; s/p (R) Pop and CIA stent 12/2014. At last office exam in June, he had a "new finding of distal superficial femoral artery stent stenosis since the last duplex exam of 03/10/15."  He is calling requesting a letter to support medical necessity of his vascular studies and office exams, since he no longer has Medicaid, or any other means of insurance, at present time.  He stated that his attorney has advised him to obtain this, to help build a case for any future expenses re: his condition, and potential limb- threatening situation.  I am willing to write the letter, but need to know what you would want to convey in it. (he stated he had a similar conversation with you in June, and that you told him to let us know if we could help)  FYI: he is scheduled for (R) LE Art. Duplex on 8/17, and to f/u with [facility] on 8/21.  Please advise.

## 2016-01-20 ENCOUNTER — Telehealth: Payer: Self-pay | Admitting: Surgery

## 2016-01-20 NOTE — Telephone Encounter (Signed)
spoke to [facility] member they req lttr to be mailed  appt for ABI & Duplex on 12/04 then f/u with Doc

## 2016-01-20 NOTE — Telephone Encounter (Signed)
-----   Message from Mena Goes, RN sent at 12/20/2015  1:23 PM EDT ----- Regarding: schedule   ----- Message ----- From: Serafina Mitchell, MD Sent: 12/20/2015  11:29 AM To: [facility] Charge Pool  12/20/2015:  Surgeon:  [name] Procedure Performed:  1.  Ultrasound-guided access, left femoral artery  2.  Abdominal aortogram  3.  Right lower extremity runoff  4.  Drug coated balloon angioplasty, right superficial femoral artery  5.  Conscious sedation (10:35-11:27)   Follow-up Vinnie Level 3 months with ABIs and a duplex of the right leg

## 2016-03-06 ENCOUNTER — Encounter (HOSPITAL_COMMUNITY): Payer: Self-pay

## 2016-03-06 ENCOUNTER — Emergency Department (HOSPITAL_COMMUNITY)
Admission: EM | Admit: 2016-03-06 | Discharge: 2016-03-06 | Disposition: A | Discharge status: Home / Self Care | Payer: Medicaid Other | Attending: Emergency Medicine | Admitting: Emergency Medicine

## 2016-03-06 DIAGNOSIS — E119 Type 2 diabetes mellitus without complications: Secondary | ICD-10-CM | POA: Diagnosis not present

## 2016-03-06 DIAGNOSIS — Z7982 Long term (current) use of aspirin: Secondary | ICD-10-CM | POA: Insufficient documentation

## 2016-03-06 DIAGNOSIS — I10 Essential (primary) hypertension: Secondary | ICD-10-CM | POA: Insufficient documentation

## 2016-03-06 DIAGNOSIS — L03211 Cellulitis of face: Secondary | ICD-10-CM | POA: Insufficient documentation

## 2016-03-06 DIAGNOSIS — I2510 Atherosclerotic heart disease of native coronary artery without angina pectoris: Secondary | ICD-10-CM | POA: Insufficient documentation

## 2016-03-06 DIAGNOSIS — Z955 Presence of coronary angioplasty implant and graft: Secondary | ICD-10-CM | POA: Insufficient documentation

## 2016-03-06 DIAGNOSIS — F17200 Nicotine dependence, unspecified, uncomplicated: Secondary | ICD-10-CM | POA: Diagnosis not present

## 2016-03-06 DIAGNOSIS — R21 Rash and other nonspecific skin eruption: Secondary | ICD-10-CM | POA: Diagnosis present

## 2016-03-06 DIAGNOSIS — Z7984 Long term (current) use of oral hypoglycemic drugs: Secondary | ICD-10-CM | POA: Insufficient documentation

## 2016-03-06 LAB — CBG MONITORING, ED: Glucose-Capillary: 182 mg/dL — ABNORMAL HIGH (ref 65–99)

## 2016-03-06 MED ORDER — SULFAMETHOXAZOLE-TRIMETHOPRIM 800-160 MG PO TABS
1.0000 | ORAL_TABLET | Freq: Once | ORAL | Status: AC
  Administered 2016-03-06: 1 via ORAL
  Filled 2016-03-06: qty 1

## 2016-03-06 MED ORDER — SULFAMETHOXAZOLE-TRIMETHOPRIM 800-160 MG PO TABS: 1.0000 | ORAL_TABLET | Freq: Two times a day (BID) | ORAL | 0 refills | Status: AC

## 2016-03-06 MED ORDER — CEPHALEXIN 250 MG PO CAPS
500.0000 mg | ORAL_CAPSULE | Freq: Once | ORAL | Status: AC
  Administered 2016-03-06: 500 mg via ORAL
  Filled 2016-03-06: qty 2

## 2016-03-06 MED ORDER — CEPHALEXIN 500 MG PO CAPS: 500.0000 mg | ORAL_CAPSULE | Freq: Four times a day (QID) | ORAL | 0 refills | Status: DC

## 2016-03-06 NOTE — ED Provider Notes (Signed)
[facility] DEPT Provider Note   CSN: [id number] Arrival date & time: 03/06/16  1333     History   Chief Complaint Chief Complaint  Patient presents with  . Cellulitis    HPI [name] is a 55 y.o. male.  Pt presents to the ED today with redness to the left cheek.  The pt said that he was bitten by a spider about 2 weeks ago and that bite healed up.  This morning, he woke up and his face was swollen and red to his left cheek.  The pt said that he does have some yellow drainage to the cheek.  He is a diabetic.      Past Medical History:  Diagnosis Date  . Anxiety   . CAD (coronary artery disease)   . Chronic back pain   . Colon polyps   . Diabetes mellitus   . Heart attack   . Hyperlipidemia   . Hypertension   . Panic attacks   . PVD (peripheral vascular disease) [facility])     Patient Active Problem List   Diagnosis Date Noted  . CAD (coronary artery disease) 08/29/2011  . Tobacco abuse 08/29/2011  . Hypertension 08/29/2011  . Hyperlipidemia 08/29/2011  . Diabetes mellitus 08/29/2011    Past Surgical History:  Procedure Laterality Date  . CORONARY ANGIOPLASTY WITH STENT PLACEMENT    . EAR CYST EXCISION  12/10/2011   Procedure: CYST REMOVAL;  Surgeon: Gae Bon, DDS;  Location: [place];  Service: Oral Surgery;  Laterality: Left;  Left Mandible Cyst Removal  . MULTIPLE EXTRACTIONS WITH ALVEOLOPLASTY  12/10/2011   Procedure: MULTIPLE EXTRACION WITH ALVEOLOPLASTY;  Surgeon: Gae Bon, DDS;  Location: [place];  Service: Oral Surgery;  Laterality: N/A;  Right Maxillary Tuberosity Reduction; Right  Maxillary Buccal Exostosis; Bilateral Mandibular Tori   . PERIPHERAL VASCULAR CATHETERIZATION N/A 12/28/2014   Procedure: Abdominal Aortogram;  Surgeon: Serafina Mitchell, MD;  Location: [place] [facility];  Service: Cardiovascular;  Laterality: N/A;  . PERIPHERAL VASCULAR CATHETERIZATION N/A 12/20/2015   Procedure: Abdominal Aortogram;  Surgeon: Serafina Mitchell,  MD;  Location: [place] [facility];  Service: Cardiovascular;  Laterality: N/A;  . PERIPHERAL VASCULAR CATHETERIZATION  12/20/2015   Procedure: Peripheral Vascular Balloon Angioplasty;  Surgeon: Serafina Mitchell, MD;  Location: [place] [facility];  Service: Cardiovascular;;       Home Medications    Prior to Admission medications   Medication Sig Start Date End Date Taking? Authorizing Provider  aspirin 325 MG tablet Take 325 mg by mouth daily.     Historical Provider, MD  cephALEXin (KEFLEX) 500 MG capsule Take 1 capsule (500 mg total) by mouth 4 (four) times daily. 03/06/16   Isla Pence, MD  clopidogrel (PLAVIX) 75 MG tablet Take 1 tablet (75 mg total) by mouth daily. 12/12/15   Serafina Mitchell, MD  empagliflozin (JARDIANCE) 25 MG TABS tablet Take 25 mg by mouth daily.    Historical Provider, MD  furosemide (LASIX) 20 MG tablet Take 20 mg by mouth daily as needed for fluid.     Historical Provider, MD  metFORMIN (GLUCOPHAGE) 1000 MG tablet Take 1,000 mg by mouth 2 (two) times daily with a meal.     Historical Provider, MD  metoprolol succinate (TOPROL-XL) 50 MG 24 hr tablet Take 50 mg by mouth daily. Take with or immediately following a meal.    Historical Provider, MD  omeprazole (PRILOSEC) 20 MG capsule Take 20 mg by mouth  every evening.  03/04/14   Historical Provider, MD  oxyCODONE-acetaminophen (PERCOCET) 7.5-325 MG per tablet Take 1 tablet by mouth every 6 (six) hours as needed for severe pain.    Historical Provider, MD  sulfamethoxazole-trimethoprim (BACTRIM DS,SEPTRA DS) 800-160 MG tablet Take 1 tablet by mouth 2 (two) times daily. 03/06/16 03/13/16  Isla Pence, MD    Family History Family History  Problem Relation Age of Onset  . Diabetes Father   . Kidney failure Father   . Hypertension Father   . Hyperlipidemia Father   . Heart disease Father     Social History Social History  Substance Use Topics  . Smoking status: Current Every Day Smoker    Packs/day: 0.25      Years: 25.00  . Smokeless tobacco: Never Used     Comment: 1/4 pack per day  . Alcohol use No     Allergies   Codeine; Gabapentin; Pregabalin; Simvastatin; Hydrocodone; Lisinopril; and Venlafaxine   Review of Systems Review of Systems  Skin: Positive for rash.  All other systems reviewed and are negative.    Physical Exam Updated Vital Signs BP 173/94 (BP Location: Right Arm)   Pulse 71   Temp 97.8 F (36.6 C) (Oral)   Resp 18   SpO2 95%   Physical Exam  Constitutional: He is oriented to person, place, and time. He appears well-developed and well-nourished.  HENT:  Head: Normocephalic.  Right Ear: External ear normal.  Left Ear: External ear normal.  Nose: Nose normal.  Mouth/Throat: Oropharynx is clear and moist.  Eyes: Conjunctivae and EOM are normal. Pupils are equal, round, and reactive to light.  Neck: Normal range of motion. Neck supple.  Cardiovascular: Normal rate, regular rhythm, normal heart sounds and intact distal pulses.   Pulmonary/Chest: Effort normal and breath sounds normal.  Abdominal: Soft. Bowel sounds are normal.  Musculoskeletal: Normal range of motion.  Neurological: He is alert and oriented to person, place, and time.  Skin: Skin is warm.     Psychiatric: He has a normal mood and affect. His behavior is normal. Judgment and thought content normal.  Nursing note and vitals reviewed.    ED Treatments / Results  Labs (all labs ordered are listed, but only abnormal results are displayed) Labs Reviewed  CBG MONITORING, ED - Abnormal; Notable for the following:       Result Value   Glucose-Capillary 182 (*)    All other components within normal limits    EKG  EKG Interpretation None       Radiology No results found.  Procedures Procedures (including critical care time)  Medications Ordered in ED Medications  sulfamethoxazole-trimethoprim (BACTRIM DS,SEPTRA DS) 800-160 MG per tablet 1 tablet (not administered)   cephALEXin (KEFLEX) capsule 500 mg (not administered)     Initial Impression / Assessment and Plan / ED Course  I have reviewed the triage vital signs and the nursing notes.  Pertinent labs & imaging results that were available during my care of the patient were reviewed by me and considered in my medical decision making (see chart for details).  Clinical Course     Pt will be treated with bactrim and keflex.  He knows to return if worse.  Final Clinical Impressions(s) / ED Diagnoses   Final diagnoses:  Facial cellulitis    New Prescriptions New Prescriptions   CEPHALEXIN (KEFLEX) 500 MG CAPSULE    Take 1 capsule (500 mg total) by mouth 4 (four) times daily.   SULFAMETHOXAZOLE-TRIMETHOPRIM (BACTRIM  DS,SEPTRA DS) 800-160 MG TABLET    Take 1 tablet by mouth 2 (two) times daily.     Isla Pence, MD 03/06/16 [phone]

## 2016-03-06 NOTE — ED Triage Notes (Signed)
Pt reports spider bite to face last week. He reports the site is oozing clear liquid. He now has a flat red circular type rash to the face. Pt reports hx of DM.

## 2016-03-26 ENCOUNTER — Ambulatory Visit: Payer: Self-pay | Admitting: Surgery

## 2016-03-26 ENCOUNTER — Ambulatory Visit (HOSPITAL_COMMUNITY)
Admission: RE | Admit: 2016-03-26 | Discharge: 2016-03-26 | Disposition: A | Discharge status: Home / Self Care | Payer: Medicaid Other | Source: Ambulatory Visit | Attending: Surgery | Admitting: Surgery

## 2016-03-26 ENCOUNTER — Ambulatory Visit (INDEPENDENT_AMBULATORY_CARE_PROVIDER_SITE_OTHER)
Admission: RE | Admit: 2016-03-26 | Discharge: 2016-03-26 | Disposition: A | Discharge status: Home / Self Care | Payer: Medicaid Other | Source: Ambulatory Visit | Attending: Surgery | Admitting: Surgery

## 2016-03-26 DIAGNOSIS — Z9861 Coronary angioplasty status: Secondary | ICD-10-CM | POA: Diagnosis not present

## 2016-03-26 DIAGNOSIS — I739 Peripheral vascular disease, unspecified: Secondary | ICD-10-CM

## 2016-03-26 DIAGNOSIS — E1151 Type 2 diabetes mellitus with diabetic peripheral angiopathy without gangrene: Secondary | ICD-10-CM | POA: Diagnosis not present

## 2016-03-26 DIAGNOSIS — Z9862 Peripheral vascular angioplasty status: Secondary | ICD-10-CM

## 2016-03-26 DIAGNOSIS — I10 Essential (primary) hypertension: Secondary | ICD-10-CM | POA: Diagnosis not present

## 2016-03-26 LAB — VAS US LOWER EXTREMITY ARTERIAL DUPLEX: RSFMPSV: -244 cm/s

## 2016-03-28 ENCOUNTER — Encounter: Payer: Self-pay | Admitting: Surgery

## 2016-04-02 ENCOUNTER — Ambulatory Visit (INDEPENDENT_AMBULATORY_CARE_PROVIDER_SITE_OTHER): Payer: Self-pay | Admitting: Surgery

## 2016-04-02 VITALS — BP 140/80 | HR 66 | Temp 98.0°F | Resp 18 | Ht 68.0 in | Wt 185.0 lb

## 2016-04-02 DIAGNOSIS — I70211 Atherosclerosis of native arteries of extremities with intermittent claudication, right leg: Secondary | ICD-10-CM

## 2016-04-02 NOTE — Progress Notes (Signed)
[facility]  Patient name: [name] MRN: [id number] DOB: 27-Oct-1960 Sex: male  REASON FOR VISIT: follow up  HPI: the patient is back for follow-up. He initially presented with lifestyle limiting claudication in the right leg as well as a nonhealing wound. His ABI was 0.34 on the right and normal on the left. On 12/28/2014 he was taken to the Cath Lab and underwent angiography. 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 This was successfully recanalized and stented using overlapping 6 mm Cordis self-expanding stents. There is also a significant gradient within the right common iliac artery which was also treated using an 8 x 39 balloon expandable stent. The patient states today that his ulcer healed very quickly after the procedure. His walking was much better.  In 2017 he developed velocity elevations within his right leg.  This was also associated with claudication symptoms when walking less than 50 feet.  Therefore on 8-29 he went for angiography.  He was found to have several areas of stenosis within the stent.  These were successfully treated using drug coated balloon angioplasty and 5 mm balloon.  He has no complaints of claudication at this time.  He did have a spider bite to his left leg has edema to his left plaque but no open wounds  Past Medical History:  Diagnosis Date  . Anxiety   . CAD (coronary artery disease)   . Chronic back pain   . Colon polyps   . Diabetes mellitus   . Heart attack   . Hyperlipidemia   . Hypertension   . Panic attacks   . PVD (peripheral vascular disease) ([facility])     Family History  Problem Relation Age of Onset  . Diabetes Father   . Kidney failure Father   . Hypertension Father   . Hyperlipidemia Father   . Heart disease Father     SOCIAL HISTORY: Social History  Substance Use Topics  . Smoking status: Current Every Day  Smoker    Packs/day: 0.25    Years: 25.00  . Smokeless tobacco: Never Used     Comment: 1/4 pack per day  . Alcohol use No    Allergies  Allergen Reactions  . Codeine Shortness Of Breath and Itching  . Gabapentin Other (See Comments)    Suicidal thoughts, extreme dreams  . Pregabalin     Suicidal thoughts,extreme dreams   . Simvastatin     Pain in joints  . Hydrocodone Nausea Only and Rash    Breathing problems   . Lisinopril Itching and Rash  . Venlafaxine Rash    Current Outpatient Prescriptions  Medication Sig Dispense Refill  . aspirin 325 MG tablet Take 325 mg by mouth daily.     . cephALEXin (KEFLEX) 500 MG capsule Take 1 capsule (500 mg total) by mouth 4 (four) times daily. 28 capsule 0  . clopidogrel (PLAVIX) 75 MG tablet Take 1 tablet (75 mg total) by mouth daily. 30 tablet 6  . empagliflozin (JARDIANCE) 25 MG TABS tablet Take 25 mg by mouth daily.    . furosemide (LASIX) 20 MG tablet Take 20 mg by mouth daily as needed for fluid.     . metFORMIN (GLUCOPHAGE) 1000 MG tablet Take 1,000 mg by mouth 2 (two) times daily with a meal.     . metoprolol succinate (TOPROL-XL) 50 MG 24 hr tablet Take 50 mg by mouth daily. Take with or immediately following a meal.    . [  START ON 04/03/2016] Oxycodone HCl 10 MG TABS Take 10 mg by mouth.    . oxyCODONE-acetaminophen (PERCOCET) 7.5-325 MG per tablet Take 1 tablet by mouth every 6 (six) hours as needed for severe pain.     No current facility-administered medications for this visit.     REVIEW OF SYSTEMS:  [X]  denotes positive finding, [ ]  denotes negative finding Cardiac  Comments:  Chest pain or chest pressure:    Shortness of breath upon exertion:    Short of breath when lying flat:    Irregular heart rhythm:        Vascular    Pain in calf, thigh, or hip brought on by ambulation:    Pain in feet at night that wakes you up from your sleep:     Blood clot in your veins:    Leg swelling:         Pulmonary    Oxygen at  home:    Productive cough:     Wheezing:         Neurologic    Sudden weakness in arms or legs:     Sudden numbness in arms or legs:     Sudden onset of difficulty speaking or slurred speech:    Temporary loss of vision in one eye:     Problems with dizziness:         Gastrointestinal    Blood in stool:     Vomited blood:         Genitourinary    Burning when urinating:     Blood in urine:        Psychiatric    Major depression:         Hematologic    Bleeding problems:    Problems with blood clotting too easily:        Skin    Rashes or ulcers:        Constitutional    Fever or chills:      PHYSICAL EXAM: Vitals:   04/02/16 1122  BP: 140/80  Pulse: 66  Resp: 18  Temp: 98 F (36.7 C)  TempSrc: Oral  SpO2: 98%  Weight: 185 lb (83.9 kg)  Height: 5\' 8"  (1.727 m)    GENERAL: The patient is a well-nourished male, in no acute distress. The vital signs are documented above. CARDIAC: There is a regular rate and rhythm.  VASCULAR: No pulses are not palpable.  There is left leg edema and an area which is consistent with a healed spider bite PULMONARY: Non-labored respirations MUSCULOSKELETAL: There are no major deformities or cyanosis. NEUROLOGIC: No focal weakness or paresthesias are detected. SKIN: There are no ulcers or rashes noted. PSYCHIATRIC: The patient has a normal affect.  DATA:  I have ordered and reviewed the duplex.  ABIs 1.0 on the right.  Prior to the intervention it was 0.84.  The left side remains at 1.03.  No significant velocities were obtained within the right leg.  MEDICAL ISSUES: Status post percutaneous intervention to the right leg: The patient underwent repeat intervention with a drug coated balloon in August 2017 for in-stent stenosis.  He has had a good result.  His symptoms every salt.  Ultrasound shows a widely patent intervention.  He will follow up in 6 months with a repeat duplex.  The patient is not taking Plavix secondary to  monetary issues.  He is taking 2 aspirin.  I told him this should be sufficient.    [name], MD [facility] [phone] Pager [phone]

## 2016-04-02 NOTE — Addendum Note (Signed)
Addended by: Lianne Cure A on: 04/02/2016 12:36 PM   Modules accepted: Orders

## 2016-09-26 ENCOUNTER — Encounter: Payer: Self-pay | Admitting: Family

## 2016-10-01 ENCOUNTER — Encounter (HOSPITAL_COMMUNITY): Payer: Self-pay

## 2016-10-01 ENCOUNTER — Ambulatory Visit: Payer: Self-pay | Admitting: Family

## 2017-04-06 ENCOUNTER — Emergency Department (HOSPITAL_COMMUNITY)
Admission: EM | Admit: 2017-04-06 | Discharge: 2017-04-06 | Disposition: A | Discharge status: Home / Self Care | Payer: Medicaid Other | Attending: Emergency Medicine | Admitting: Emergency Medicine

## 2017-04-06 ENCOUNTER — Other Ambulatory Visit: Payer: Self-pay

## 2017-04-06 DIAGNOSIS — Z79899 Other long term (current) drug therapy: Secondary | ICD-10-CM | POA: Insufficient documentation

## 2017-04-06 DIAGNOSIS — E785 Hyperlipidemia, unspecified: Secondary | ICD-10-CM | POA: Insufficient documentation

## 2017-04-06 DIAGNOSIS — I739 Peripheral vascular disease, unspecified: Secondary | ICD-10-CM | POA: Insufficient documentation

## 2017-04-06 DIAGNOSIS — I10 Essential (primary) hypertension: Secondary | ICD-10-CM | POA: Insufficient documentation

## 2017-04-06 DIAGNOSIS — Z7982 Long term (current) use of aspirin: Secondary | ICD-10-CM | POA: Insufficient documentation

## 2017-04-06 DIAGNOSIS — E119 Type 2 diabetes mellitus without complications: Secondary | ICD-10-CM | POA: Diagnosis not present

## 2017-04-06 DIAGNOSIS — F17200 Nicotine dependence, unspecified, uncomplicated: Secondary | ICD-10-CM | POA: Diagnosis not present

## 2017-04-06 DIAGNOSIS — I2510 Atherosclerotic heart disease of native coronary artery without angina pectoris: Secondary | ICD-10-CM | POA: Insufficient documentation

## 2017-04-06 DIAGNOSIS — Z7902 Long term (current) use of antithrombotics/antiplatelets: Secondary | ICD-10-CM | POA: Insufficient documentation

## 2017-04-06 DIAGNOSIS — R21 Rash and other nonspecific skin eruption: Secondary | ICD-10-CM | POA: Diagnosis not present

## 2017-04-06 DIAGNOSIS — Z7984 Long term (current) use of oral hypoglycemic drugs: Secondary | ICD-10-CM | POA: Diagnosis not present

## 2017-04-06 LAB — CBC WITH DIFFERENTIAL/PLATELET
BASOS ABS: 0.1 10*3/uL (ref 0.0–0.1)
BASOS PCT: 1 %
EOS ABS: 0.3 10*3/uL (ref 0.0–0.7)
Eosinophils Relative: 2 %
HCT: 45.7 % (ref 39.0–52.0)
Hemoglobin: 15.7 g/dL (ref 13.0–17.0)
Lymphocytes Relative: 23 %
Lymphs Abs: 2.5 10*3/uL (ref 0.7–4.0)
MCH: 30 pg (ref 26.0–34.0)
MCHC: 34.4 g/dL (ref 30.0–36.0)
MCV: 87.4 fL (ref 78.0–100.0)
Monocytes Absolute: 1 10*3/uL (ref 0.1–1.0)
Monocytes Relative: 9 %
Neutro Abs: 7.2 10*3/uL (ref 1.7–7.7)
Neutrophils Relative %: 65 %
Platelets: 245 10*3/uL (ref 150–400)
RBC: 5.23 MIL/uL (ref 4.22–5.81)
RDW: 14.3 % (ref 11.5–15.5)
WBC: 11 10*3/uL — AB (ref 4.0–10.5)

## 2017-04-06 LAB — COMPREHENSIVE METABOLIC PANEL
ALBUMIN: 3.6 g/dL (ref 3.5–5.0)
ALT: 13 U/L — AB (ref 17–63)
AST: 18 U/L (ref 15–41)
Alkaline Phosphatase: 67 U/L (ref 38–126)
Anion gap: 8 (ref 5–15)
BUN: 14 mg/dL (ref 6–20)
CALCIUM: 8.9 mg/dL (ref 8.9–10.3)
CHLORIDE: 103 mmol/L (ref 101–111)
CO2: 25 mmol/L (ref 22–32)
CREATININE: 0.93 mg/dL (ref 0.61–1.24)
GFR calc Af Amer: >60 mL/min (ref >60)
GFR calc non Af Amer: >60 mL/min (ref >60)
GLUCOSE: 143 mg/dL — AB (ref 65–99)
Potassium: 4 mmol/L (ref 3.5–5.1)
SODIUM: 136 mmol/L (ref 135–145)
Total Bilirubin: 0.3 mg/dL (ref 0.3–1.2)
Total Protein: 6.3 g/dL — ABNORMAL LOW (ref 6.5–8.1)

## 2017-04-06 LAB — I-STAT CG4 LACTIC ACID, ED: Lactic Acid, Venous: 2.08 mmol/L (ref 0.5–1.9)

## 2017-04-06 MED ORDER — TRIAMCINOLONE ACETONIDE 0.1 % EX CREA: 1.0000 "application " | TOPICAL_CREAM | Freq: Two times a day (BID) | CUTANEOUS | 0 refills | Status: DC

## 2017-04-06 MED ORDER — DEXAMETHASONE 4 MG PO TABS
8.0000 mg | ORAL_TABLET | Freq: Once | ORAL | Status: AC
  Administered 2017-04-06: 8 mg via ORAL
  Filled 2017-04-06: qty 2

## 2017-04-06 NOTE — ED Triage Notes (Addendum)
Pt has rash for about 2 weeks, originally it was located on right arm, behind bilateral knees, and on his back. Pt states it is now on his right arm, left arm, left leg. Pt took a full coarse of doxycyline. Pt also has neck pain and headache and right shoulder pain since yesterday,. Afebrile. Bradycardic at triage, prior EKG also shows bradycardia in the 50s.

## 2017-04-06 NOTE — ED Notes (Signed)
Pt with generalized rash x 2 weeks. Red, cracked, excoriated areas noted to BUE, BLE, and back. Pt reports the rash began on his R arm and has been spreading x 2 weeks. Per pt, he was supposed to f/u with PCP after doxycycline administration did not work for him however due to the snow he was unable to f/u because the offices have been closed. A7Ox4. Respirations even and unlabored. Pt reports mild chills and nausea with rash. Denies fever, vomiting, diarrhea.

## 2017-04-21 NOTE — ED Provider Notes (Signed)
[facility] EMERGENCY DEPARTMENT Provider Note   CSN: [id number] Arrival date & time: 04/06/17  1630     History   Chief Complaint Chief Complaint  Patient presents with  . Rash    HPI [name] is a 56 y.o. male.  HPI   56 year old male with pruritic rash.  Onset about 2 weeks ago.  First noticed on his upper extremities and has since spread into his back and legs.  Recently seen for the same and placed on doxycycline without improvement.  No fevers or chills.  No new exposures that he is aware of.  Past Medical History:  Diagnosis Date  . Anxiety   . CAD (coronary artery disease)   . Chronic back pain   . Colon polyps   . Diabetes mellitus   . Heart attack   . Hyperlipidemia   . Hypertension   . Panic attacks   . PVD (peripheral vascular disease) [facility])     Patient Active Problem List   Diagnosis Date Noted  . CAD (coronary artery disease) 08/29/2011  . Tobacco abuse 08/29/2011  . Hypertension 08/29/2011  . Hyperlipidemia 08/29/2011  . Diabetes mellitus 08/29/2011    Past Surgical History:  Procedure Laterality Date  . CORONARY ANGIOPLASTY WITH STENT PLACEMENT    . EAR CYST EXCISION  12/10/2011   Procedure: CYST REMOVAL;  Surgeon: Gae Bon, DDS;  Location: [place];  Service: Oral Surgery;  Laterality: Left;  Left Mandible Cyst Removal  . MULTIPLE EXTRACTIONS WITH ALVEOLOPLASTY  12/10/2011   Procedure: MULTIPLE EXTRACION WITH ALVEOLOPLASTY;  Surgeon: Gae Bon, DDS;  Location: [place];  Service: Oral Surgery;  Laterality: N/A;  Right Maxillary Tuberosity Reduction; Right  Maxillary Buccal Exostosis; Bilateral Mandibular Tori   . PERIPHERAL VASCULAR CATHETERIZATION N/A 12/28/2014   Procedure: Abdominal Aortogram;  Surgeon: Serafina Mitchell, MD;  Location: [place] [facility];  Service: Cardiovascular;  Laterality: N/A;  . PERIPHERAL VASCULAR CATHETERIZATION N/A 12/20/2015   Procedure: Abdominal Aortogram;  Surgeon: Serafina Mitchell,  MD;  Location: [place] [facility];  Service: Cardiovascular;  Laterality: N/A;  . PERIPHERAL VASCULAR CATHETERIZATION  12/20/2015   Procedure: Peripheral Vascular Balloon Angioplasty;  Surgeon: Serafina Mitchell, MD;  Location: [place] [facility];  Service: Cardiovascular;;       Home Medications    Prior to Admission medications   Medication Sig Start Date End Date Taking? Authorizing Provider  aspirin EC 81 MG tablet Take 81 mg by mouth.   Yes [provider]  clobetasol cream (TEMOVATE) 0.86 % Apply 1 application topically 2 (two) times daily.   Yes [provider]  empagliflozin (JARDIANCE) 25 MG TABS tablet Take 25 mg by mouth daily.   Yes [provider]  empagliflozin (JARDIANCE) 25 MG TABS tablet Take 25 mg by mouth daily. 08/08/16  Yes [provider]  metFORMIN (GLUCOPHAGE) 1000 MG tablet Take 1,000 mg by mouth 2 (two) times daily with a meal.    Yes [provider]  metoprolol succinate (TOPROL-XL) 50 MG 24 hr tablet Take 50 mg by mouth daily. Take with or immediately following a meal.   Yes [provider]  Morphine Sulfate ER (MORPHABOND ER) 30 MG T12A Take 30 mg by mouth 2 (two) times daily. 04/24/17 05/24/17 Yes [provider]  Oxycodone HCl 10 MG TABS Take 10 mg by mouth. 04/03/16  Yes [provider]  rosuvastatin (CRESTOR) 10 MG tablet Take 10 mg by mouth daily. 02/23/17  Yes [provider]  clopidogrel (PLAVIX) 75 MG tablet Take 1 tablet (75 mg total) by mouth daily. Patient not taking: Reported on 04/06/2017 12/12/15   Serafina Mitchell, MD  triamcinolone cream (KENALOG) 0.1 % Apply 1 application topically 2 (two) times daily. 04/06/17   Virgel Manifold, MD    Family History Family History  Problem Relation Age of Onset  . Diabetes Father   . Kidney failure Father   . Hypertension Father   . Hyperlipidemia Father   . Heart disease Father     Social History Social History   Tobacco Use  .  Smoking status: Current Every Day Smoker    Packs/day: 0.25    Years: 25.00    Pack years: 6.25  . Smokeless tobacco: Never Used  . Tobacco comment: 1/4 pack per day  Substance Use Topics  . Alcohol use: No    Alcohol/week: 0.0 oz  . Drug use: No     Allergies   Codeine; Gabapentin; Pregabalin; Simvastatin; Hydrocodone; Lisinopril; and Venlafaxine   Review of Systems Review of Systems All systems reviewed and negative, other than as noted in HPI.   Physical Exam Updated Vital Signs BP (!) 143/72 (BP Location: Left Arm)   Pulse (!) 52   Temp 98.7 F (37.1 C) (Oral)   Resp 16   SpO2 98%   Physical Exam  Constitutional: He appears well-developed and well-nourished. No distress.  HENT:  Head: Normocephalic and atraumatic.  Eyes: Conjunctivae are normal. Right eye exhibits no discharge. Left eye exhibits no discharge.  Neck: Neck supple.  Cardiovascular: Normal rate, regular rhythm and normal heart sounds. Exam reveals no gallop and no friction rub.  No murmur heard. Pulmonary/Chest: Effort normal and breath sounds normal. No respiratory distress.  Abdominal: Soft. He exhibits no distension. There is no tenderness.  Musculoskeletal: He exhibits no edema or tenderness.  Neurological: He is alert.  Skin: Skin is warm and dry. Rash noted.  Erythematous maculopapular rash.  Some scaling.  Excoriations.  Primarily to extremities with a small area noted on upper back.  Palm soles/last soles spared.  No mucous membrane involvement.  Psychiatric: He has a normal mood and affect. His behavior is normal. Thought content normal.  Nursing note and vitals reviewed.    ED Treatments / Results  Labs (all labs ordered are listed, but only abnormal results are displayed) Labs Reviewed  CBC WITH DIFFERENTIAL/PLATELET - Abnormal; Notable for the following components:      Result Value   WBC 11.0 (*)    All other components within normal limits  COMPREHENSIVE METABOLIC PANEL -  Abnormal; Notable for the following components:   Glucose, Bld 143 (*)    Total Protein 6.3 (*)    ALT 13 (*)    All other components within normal limits  I-STAT CG4 LACTIC ACID, ED - Abnormal; Notable for the following components:   Lactic Acid, Venous 2.08 (*)    All other components within normal limits    EKG  EKG Interpretation None       Radiology No results found.  Procedures Procedures (including critical care time)  Medications Ordered in ED Medications  dexamethasone (DECADRON) tablet 8 mg (8 mg Oral Given 04/06/17 1944)     Initial Impression / Assessment and Plan / ED Course  I have reviewed the triage vital signs and the nursing notes.  Pertinent labs & imaging results that were available during my care of the patient were reviewed by me and considered  in my medical decision making (see chart for details).     Erythematous scaling rash with some excoriations.  No mucous membrane involvement.  Will try a course of steroid cream.  Doubt emergent process.  Final Clinical Impressions(s) / ED Diagnoses   Final diagnoses:  Rash    ED Discharge Orders        Ordered    triamcinolone cream (KENALOG) 0.1 %  2 times daily     04/06/17 1937       Virgel Manifold, MD 04/21/17 1101

## 2017-09-20 ENCOUNTER — Encounter (HOSPITAL_COMMUNITY): Payer: Self-pay

## 2017-09-20 ENCOUNTER — Ambulatory Visit: Payer: Self-pay | Admitting: Family

## 2017-09-20 ENCOUNTER — Other Ambulatory Visit: Payer: Self-pay

## 2017-09-20 DIAGNOSIS — I739 Peripheral vascular disease, unspecified: Secondary | ICD-10-CM

## 2017-09-20 DIAGNOSIS — I70211 Atherosclerosis of native arteries of extremities with intermittent claudication, right leg: Secondary | ICD-10-CM

## 2017-10-01 ENCOUNTER — Encounter: Payer: Self-pay | Admitting: Family

## 2017-10-01 ENCOUNTER — Ambulatory Visit (INDEPENDENT_AMBULATORY_CARE_PROVIDER_SITE_OTHER): Payer: Medicaid Other | Admitting: Family

## 2017-10-01 ENCOUNTER — Other Ambulatory Visit: Payer: Self-pay

## 2017-10-01 ENCOUNTER — Ambulatory Visit (HOSPITAL_COMMUNITY)
Admission: RE | Admit: 2017-10-01 | Discharge: 2017-10-01 | Disposition: A | Discharge status: Home / Self Care | Payer: Medicaid Other | Source: Ambulatory Visit | Attending: Surgery | Admitting: Surgery

## 2017-10-01 ENCOUNTER — Ambulatory Visit (INDEPENDENT_AMBULATORY_CARE_PROVIDER_SITE_OTHER)
Admission: RE | Admit: 2017-10-01 | Discharge: 2017-10-01 | Disposition: A | Discharge status: Home / Self Care | Payer: Medicaid Other | Source: Ambulatory Visit | Attending: Surgery | Admitting: Surgery

## 2017-10-01 VITALS — BP 166/85 | HR 45 | Temp 97.0°F | Resp 20 | Ht 68.0 in | Wt 193.0 lb

## 2017-10-01 DIAGNOSIS — I70211 Atherosclerosis of native arteries of extremities with intermittent claudication, right leg: Secondary | ICD-10-CM

## 2017-10-01 DIAGNOSIS — I10 Essential (primary) hypertension: Secondary | ICD-10-CM | POA: Insufficient documentation

## 2017-10-01 DIAGNOSIS — F17200 Nicotine dependence, unspecified, uncomplicated: Secondary | ICD-10-CM

## 2017-10-01 DIAGNOSIS — I824Z2 Acute embolism and thrombosis of unspecified deep veins of left distal lower extremity: Secondary | ICD-10-CM

## 2017-10-01 DIAGNOSIS — M79662 Pain in left lower leg: Secondary | ICD-10-CM

## 2017-10-01 DIAGNOSIS — E119 Type 2 diabetes mellitus without complications: Secondary | ICD-10-CM | POA: Diagnosis not present

## 2017-10-01 DIAGNOSIS — I779 Disorder of arteries and arterioles, unspecified: Secondary | ICD-10-CM | POA: Diagnosis not present

## 2017-10-01 DIAGNOSIS — E1151 Type 2 diabetes mellitus with diabetic peripheral angiopathy without gangrene: Secondary | ICD-10-CM | POA: Diagnosis not present

## 2017-10-01 DIAGNOSIS — I739 Peripheral vascular disease, unspecified: Secondary | ICD-10-CM | POA: Diagnosis present

## 2017-10-01 DIAGNOSIS — I70203 Unspecified atherosclerosis of native arteries of extremities, bilateral legs: Secondary | ICD-10-CM | POA: Diagnosis not present

## 2017-10-01 DIAGNOSIS — E785 Hyperlipidemia, unspecified: Secondary | ICD-10-CM | POA: Diagnosis not present

## 2017-10-01 NOTE — Patient Instructions (Signed)
Steps to Quit Smoking Smoking tobacco can be bad for your health. It can also affect almost every organ in your body. Smoking puts you and people around you at risk for many serious long-lasting (chronic) diseases. Quitting smoking is hard, but it is one of the best things that you can do for your health. It is never too late to quit. What are the benefits of quitting smoking? When you quit smoking, you lower your risk for getting serious diseases and conditions. They can include:  Lung cancer or lung disease.  Heart disease.  Stroke.  Heart attack.  Not being able to have children (infertility).  Weak bones (osteoporosis) and broken bones (fractures).  If you have coughing, wheezing, and shortness of breath, those symptoms may get better when you quit. You may also get sick less often. If you are pregnant, quitting smoking can help to lower your chances of having a baby of low birth weight. What can I do to help me quit smoking? Talk with your doctor about what can help you quit smoking. Some things you can do (strategies) include:  Quitting smoking totally, instead of slowly cutting back how much you smoke over a period of time.  Going to in-person counseling. You are more likely to quit if you go to many counseling sessions.  Using resources and support systems, such as: ? Online chats with a counselor. ? Phone quitlines. ? Printed self-help materials. ? Support groups or group counseling. ? Text messaging programs. ? Mobile phone apps or applications.  Taking medicines. Some of these medicines may have nicotine in them. If you are pregnant or breastfeeding, do not take any medicines to quit smoking unless your doctor says it is okay. Talk with your doctor about counseling or other things that can help you.  Talk with your doctor about using more than one strategy at the same time, such as taking medicines while you are also going to in-person counseling. This can help make  quitting easier. What things can I do to make it easier to quit? Quitting smoking might feel very hard at first, but there is a lot that you can do to make it easier. Take these steps:  Talk to your family and friends. Ask them to support and encourage you.  Call phone quitlines, reach out to support groups, or work with a counselor.  Ask people who smoke to not smoke around you.  Avoid places that make you want (trigger) to smoke, such as: ? Bars. ? Parties. ? Smoke-break areas at work.  Spend time with people who do not smoke.  Lower the stress in your life. Stress can make you want to smoke. Try these things to help your stress: ? Getting regular exercise. ? Deep-breathing exercises. ? Yoga. ? Meditating. ? Doing a body scan. To do this, close your eyes, focus on one area of your body at a time from head to toe, and notice which parts of your body are tense. Try to relax the muscles in those areas.  Download or buy apps on your mobile phone or tablet that can help you stick to your quit plan. There are many free apps, such as QuitGuide from the CDC (Centers for Disease Control and Prevention). You can find more support from smokefree.gov and other websites.  This information is not intended to replace advice given to you by your health care provider. Make sure you discuss any questions you have with your health care provider. Document Released: 02/03/2009 Document   Revised: 12/06/2015 Document Reviewed: 08/24/2014 Elsevier Interactive Patient Education  2018 Elsevier Inc.     Peripheral Vascular Disease Peripheral vascular disease (PVD) is a disease of the blood vessels that are not part of your heart and brain. A simple term for PVD is poor circulation. In most cases, PVD narrows the blood vessels that carry blood from your heart to the rest of your body. This can result in a decreased supply of blood to your arms, legs, and internal organs, like your stomach or kidneys.  However, it most often affects a person's lower legs and feet. There are two types of PVD.  Organic PVD. This is the more common type. It is caused by damage to the structure of blood vessels.  Functional PVD. This is caused by conditions that make blood vessels contract and tighten (spasm).  Without treatment, PVD tends to get worse over time. PVD can also lead to acute ischemic limb. This is when an arm or limb suddenly has trouble getting enough blood. This is a medical emergency. Follow these instructions at home:  Take medicines only as told by your doctor.  Do not use any tobacco products, including cigarettes, chewing tobacco, or electronic cigarettes. If you need help quitting, ask your doctor.  Lose weight if you are overweight, and maintain a healthy weight as told by your doctor.  Eat a diet that is low in fat and cholesterol. If you need help, ask your doctor.  Exercise regularly. Ask your doctor for some good activities for you.  Take good care of your feet. ? Wear comfortable shoes that fit well. ? Check your feet often for any cuts or sores. Contact a doctor if:  You have cramps in your legs while walking.  You have leg pain when you are at rest.  You have coldness in a leg or foot.  Your skin changes.  You are unable to get or have an erection (erectile dysfunction).  You have cuts or sores on your feet that are not healing. Get help right away if:  Your arm or leg turns cold and blue.  Your arms or legs become red, warm, swollen, painful, or numb.  You have chest pain or trouble breathing.  You suddenly have weakness in your face, arm, or leg.  You become very confused or you cannot speak.  You suddenly have a very bad headache.  You suddenly cannot see. This information is not intended to replace advice given to you by your health care provider. Make sure you discuss any questions you have with your health care provider. Document Released:  07/04/2009 Document Revised: 09/15/2015 Document Reviewed: 09/17/2013 Elsevier Interactive Patient Education  2017 Elsevier Inc.  

## 2017-10-01 NOTE — Progress Notes (Signed)
[facility] [place]   CC: Follow up peripheral artery occlusive disease  History of Present Illness [name] is a 57 y.o. male who is s/p drug coated balloon angioplasty, right superficial femoral artery on 12-20-15 for end stent stenosis by Dr. Trula Slade.    He is also s/p stenting of right distal SFA and proximal popliteal artery occlusion, and stenting of right CIA on 12/28/2014. The patient states that his ulcer healed very quickly after the procedure.    Dr. Trula Slade last evaluated pt on 04-02-16. At that time  ABIs were 1.0 on the right.  Prior to the intervention it was 0.84.  The left side remains at 1.03.  No significant velocities were obtained within the right leg. Status post percutaneous intervention to the right leg: The patient underwent repeat intervention with a drug coated balloon in August 2017 for in-stent stenosis.  He has had a good result.  His symptoms resolved. Ultrasound shows a widely patent intervention.  He was to follow up in 6 months with a repeat duplex.  About April 2019 left lower leg lateral aspect of calf started hurting after walking about 75 yards, relieved with rest, seems to be improving.  He denies any prolonged period of sitting or travel, denies dyspnea.    He has applied for disability based re his back issues, arthritis, and DM neuropathy.  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 Stanford Breed is his cardiologist, has one cardiac stent. He states that early CAD runs in his family.   He is being treated for a generalized rash.   He denies any known history of stroke or TIA.  0.93 is last serum creatinine result on file, dated 04-06-17.     Diabetic: Yes, states his last A1C was 7.4, improved from 8.2 Tobacco use:: smoker  (decreased to 5-6 cigarettes/day)   Pt meds include: Statin :Yes Betablocker: Yes ASA: Yes Other anticoagulants/antiplatelets: Plavix     Past Medical History:   Diagnosis Date  . Anxiety   . CAD (coronary artery disease)   . Chronic back pain   . Colon polyps   . Diabetes mellitus   . Heart attack ([place])   . Hyperlipidemia   . Hypertension   . Panic attacks   . PVD (peripheral vascular disease) ([place])     Social History Social History   Tobacco Use  . Smoking status: Current Every Day Smoker    Packs/day: 0.25    Years: 25.00    Pack years: 6.25  . Smokeless tobacco: Never Used  . Tobacco comment: 1/4 pack per day  Substance Use Topics  . Alcohol use: No    Alcohol/week: 0.0 oz  . Drug use: No    Family History Family History  Problem Relation Age of Onset  . Diabetes Father   . Kidney failure Father   . Hypertension Father   . Hyperlipidemia Father   . Heart disease Father     Past Surgical History:  Procedure Laterality Date  . CORONARY ANGIOPLASTY WITH STENT PLACEMENT    . EAR CYST EXCISION  12/10/2011   Procedure: CYST REMOVAL;  Surgeon: Gae Bon, DDS;  Location: [facility];  Service: Oral Surgery;  Laterality: Left;  Left Mandible Cyst Removal  . MULTIPLE EXTRACTIONS WITH ALVEOLOPLASTY  12/10/2011   Procedure: MULTIPLE EXTRACION WITH ALVEOLOPLASTY;  Surgeon: Gae Bon, DDS;  Location: [facility];  Service: Oral Surgery;  Laterality: N/A;  Right  Maxillary Tuberosity Reduction; Right  Maxillary Buccal Exostosis; Bilateral Mandibular Tori   . PERIPHERAL VASCULAR CATHETERIZATION N/A 12/28/2014   Procedure: Abdominal Aortogram;  Surgeon: Serafina Mitchell, MD;  Location: [facility];  Service: Cardiovascular;  Laterality: N/A;  . PERIPHERAL VASCULAR CATHETERIZATION N/A 12/20/2015   Procedure: Abdominal Aortogram;  Surgeon: Serafina Mitchell, MD;  Location: [facility];  Service: Cardiovascular;  Laterality: N/A;  . PERIPHERAL VASCULAR CATHETERIZATION  12/20/2015   Procedure: Peripheral Vascular Balloon Angioplasty;  Surgeon: Serafina Mitchell, MD;  Location: [facility];  Service: Cardiovascular;;    Allergies   Allergen Reactions  . Codeine Shortness Of Breath and Itching  . Gabapentin Other (See Comments)    Suicidal thoughts, extreme dreams  . Pregabalin     Suicidal thoughts,extreme dreams   . Simvastatin     Pain in joints  . Hydrocodone Nausea Only and Rash    Breathing problems   . Lisinopril Itching and Rash  . Venlafaxine Rash    Current Outpatient Medications  Medication Sig Dispense Refill  . aspirin EC 81 MG tablet Take 81 mg by mouth.    . clobetasol cream (TEMOVATE) 8.67 % Apply 1 application topically 2 (two) times daily.    . clopidogrel (PLAVIX) 75 MG tablet Take 1 tablet (75 mg total) by mouth daily. 30 tablet 6  . empagliflozin (JARDIANCE) 25 MG TABS tablet Take 25 mg by mouth daily.    . empagliflozin (JARDIANCE) 25 MG TABS tablet Take 25 mg by mouth daily.    . metFORMIN (GLUCOPHAGE) 1000 MG tablet Take 1,000 mg by mouth 2 (two) times daily with a meal.     . metoprolol succinate (TOPROL-XL) 50 MG 24 hr tablet Take 50 mg by mouth daily. Take with or immediately following a meal.    . Oxycodone HCl 10 MG TABS Take 10 mg by mouth.    . rosuvastatin (CRESTOR) 10 MG tablet Take 10 mg by mouth daily.  3  . triamcinolone cream (KENALOG) 0.1 % Apply 1 application topically 2 (two) times daily. 30 g 0   No current facility-administered medications for this visit.     ROS: See HPI for pertinent positives and negatives.   Physical Examination  Vitals:   10/01/17 1544 10/01/17 1546  BP: (!) 178/88 (!) 166/85  Pulse: (!) 45   Resp: 20   Temp: (!) 97 F (36.1 C)   TempSrc: Oral   SpO2: 97%   Weight: 193 lb (87.5 kg)   Height: 5\' 8"  (1.727 m)    Body mass index is 29.35 kg/m.  General: A&O x 3, WDWN, male in NAD. Gait: normal HENT: No gross abnormalities.  Eyes: PERRLA. Pulmonary: Respirations are non labored, CTAB, good air movement Cardiac: regular rhythm, no detected murmur.         Carotid Bruits Right Left   Negative Negative   Radial pulses are 2+  palpable bilaterally   Adominal aortic pulse is not palpable                         VASCULAR EXAM: Extremities without ischemic changes, without Gangrene; without open wounds. Hemosiderin staining in a gaiter fashion only at his left lower leg, not on the right. No peripheral edema. No erythema.  LE Pulses Right Left       FEMORAL  faintly palpable  2+ palpable        POPLITEAL  not palpable   not palpable       POSTERIOR TIBIAL  not palpable   2+ palpable        DORSALIS PEDIS      ANTERIOR TIBIAL faintly palpable  not palpable    Abdomen: soft, NT, no palpable masses. Skin: no cellulitis, no ulcers noted. Several dry lesions on legs and forearms.  Musculoskeletal: no muscle wasting or atrophy.  Neurologic: A&O X 3; appropriate affect, Sensation is abnormal in his feet; MOTOR FUNCTION:  moving all extremities equally, motor strength 5/5 throughout. Speech is fluent/normal. CN 2-12 intact. Psychiatric: Thought content is normal, mood appropriate for clinical situation.     ASSESSMENT: [name] is a 57 y.o. male who is s/p drug coated balloon angioplasty or right SFA on 12-20-15 for end stent stenosis by Dr. Trula Slade.  He is also s/p stenting of right distal SFA and proximal popliteal artery occlusion, and stenting of right CIA on 12/28/2014.  About April 2019 left lower leg lateral aspect of calf started hurting after walking about 75 yards, relieved with rest, seems to be improving.  He denies any prolonged period of sitting or travel, denies dyspnea.    His ABI's are normal bilaterally with all biphasic waveforms; therefore the pain in his left calf is not from lack of arterial perfusion. There is hemosiderin staining in a gaiter fashion only at his left lower leg, not on the right. Pt states the staining started about 2 months ago, when the pain in his left calf  started. No swelling no edema on either lower leg, no erythema.  Will obtain venous duplex of left leg in the next day or two, pt states he cannot stay for testing today.   His atherosclerotic risk factors include almost in control DM, continued smoking x 25 years, and CAD with hx of 2-3 MI's. He takes a daily statin, ASA, and Plavix.     DATA  Bilateral LE Arterial Duplex (10/01/17): Right: highest velocity is in mid stent at 254 cm/s, all triphasic waveforms except biphasic at mid stent.  Left: Highest velocity is mid popliteal at 288 cm/s; biphasic waveforms except triphasic at [facility], monophasic at distal peroneal.    ABI (Date: 10/01/2017):  R:   ABI: 1.01 (was 1.00 on 03-26-16),   PT: bi  DP: bi  TBI:  0.60 (was .61)  L:   ABI: 1.07 (was 1.03),   PT: bi  DP: bi  TBI: 0.57 (was 0.62)  Bilateral ABI remain normal with all biphasic waveforms.   Bilateral TBI remain stable.     PLAN:  Based on the patient's vascular studies and examination, pt will return to clinic in 1-2 days for left leg DVT duplex.  ABI's and right LE arterial duplex in 9 months.   Over 3 minutes was spent counseling patient re smoking cessation, and patient was given several free resources re smoking cessation.   I discussed in depth with the patient the nature of atherosclerosis, and emphasized the importance of maximal medical management including strict control of blood pressure, blood glucose, and lipid levels, obtaining regular exercise, and cessation of smoking.  The patient is aware that without maximal medical management the underlying atherosclerotic disease process will progress, limiting the benefit of any interventions.  The patient was given information about PAD including signs, symptoms, treatment, what  symptoms should prompt the patient to seek immediate medical care, and risk reduction measures to take.  Clemon Chambers, RN, MSN, FNP-C [facility]  [employer] Phone: [phone]  Clinic MD: Early  10/01/17 4:09 PM

## 2017-10-03 ENCOUNTER — Ambulatory Visit (INDEPENDENT_AMBULATORY_CARE_PROVIDER_SITE_OTHER): Payer: Medicaid Other | Admitting: Family

## 2017-10-03 ENCOUNTER — Ambulatory Visit (HOSPITAL_COMMUNITY)
Admission: RE | Admit: 2017-10-03 | Discharge: 2017-10-03 | Disposition: A | Discharge status: Home / Self Care | Payer: Medicaid Other | Source: Ambulatory Visit | Attending: Vascular Surgery | Admitting: Vascular Surgery

## 2017-10-03 ENCOUNTER — Encounter: Payer: Self-pay | Admitting: Family

## 2017-10-03 VITALS — BP 174/89 | HR 45 | Temp 97.0°F | Resp 16 | Ht 68.0 in | Wt 195.9 lb

## 2017-10-03 DIAGNOSIS — I779 Disorder of arteries and arterioles, unspecified: Secondary | ICD-10-CM

## 2017-10-03 DIAGNOSIS — E1151 Type 2 diabetes mellitus with diabetic peripheral angiopathy without gangrene: Secondary | ICD-10-CM | POA: Diagnosis not present

## 2017-10-03 DIAGNOSIS — M79662 Pain in left lower leg: Secondary | ICD-10-CM | POA: Diagnosis not present

## 2017-10-03 DIAGNOSIS — I824Z2 Acute embolism and thrombosis of unspecified deep veins of left distal lower extremity: Secondary | ICD-10-CM | POA: Insufficient documentation

## 2017-10-03 DIAGNOSIS — F17200 Nicotine dependence, unspecified, uncomplicated: Secondary | ICD-10-CM

## 2017-10-03 NOTE — Progress Notes (Signed)
[facility] [place]   CC: Follow up peripheral artery occlusive disease   History of Present Illness [name] is a 57 y.o. male who is s/p drug coated balloon angioplasty, right superficial femoral artery on 12-20-15 for end stent stenosis by Dr. Trula Slade.    He is also s/p stenting of right distal SFA and proximal popliteal artery occlusion, and stenting of right CIA on 12/28/2014. The patient states that his ulcer healed very quickly after the procedure.    Dr. Trula Slade last evaluated pt on 04-02-16. At that time ABIs were 1.0 on the right. Prior to the intervention it was 0.84. The left side remains at 1.03. No significant velocities were obtained within the right leg. Status post percutaneous intervention to the right leg: The patient underwent repeat intervention with a drug coated balloon in August 2017 for in-stent stenosis. He has had a good result. His symptoms resolved.Ultrasound shows a widely patent intervention. He was to follow up in 6 months with a repeat duplex.  About April 2019 left lower leg lateral aspect of calf started hurting after walking about 75 yards, relieved with rest, seems to be improving.  He denies any prolonged period of sitting or travel, denies dyspnea.    He has applied for disability based re his back issues, arthritis, and DM neuropathy.  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 Stanford Breed is his cardiologist, has one cardiac stent. He states that early CAD runs in his family.   He is being treated for a generalized rash.   He denies any known history of stroke or TIA.  0.93 is last serum creatinine result on file, dated 04-06-17.     Diabetic: Yes, states his last A1C was 7.4, improved from 8.2 Tobacco use:: smoker (decreased to 5-6 cigarettes/day)   Pt meds include: Statin :Yes Betablocker: Yes ASA: Yes Other anticoagulants/antiplatelets: Plavix      Past Medical  History:  Diagnosis Date  . Anxiety   . CAD (coronary artery disease)   . Chronic back pain   . Colon polyps   . Diabetes mellitus   . Heart attack ([place])   . Hyperlipidemia   . Hypertension   . Panic attacks   . PVD (peripheral vascular disease) ([place])     Social History Social History   Tobacco Use  . Smoking status: Current Every Day Smoker    Packs/day: 0.25    Years: 25.00    Pack years: 6.25    Types: Cigarettes  . Smokeless tobacco: Never Used  . Tobacco comment: 1/4 pack per day  Substance Use Topics  . Alcohol use: No    Alcohol/week: 0.0 oz  . Drug use: No    Family History Family History  Problem Relation Age of Onset  . Diabetes Father   . Kidney failure Father   . Hypertension Father   . Hyperlipidemia Father   . Heart disease Father     Past Surgical History:  Procedure Laterality Date  . CORONARY ANGIOPLASTY WITH STENT PLACEMENT    . EAR CYST EXCISION  12/10/2011   Procedure: CYST REMOVAL;  Surgeon: Gae Bon, DDS;  Location: [facility];  Service: Oral Surgery;  Laterality: Left;  Left Mandible Cyst Removal  . MULTIPLE EXTRACTIONS WITH ALVEOLOPLASTY  12/10/2011   Procedure: MULTIPLE EXTRACION WITH ALVEOLOPLASTY;  Surgeon: Gae Bon, DDS;  Location: [facility];  Service: Oral Surgery;  Laterality: N/A;  Right Maxillary Tuberosity  Reduction; Right  Maxillary Buccal Exostosis; Bilateral Mandibular Tori   . PERIPHERAL VASCULAR CATHETERIZATION N/A 12/28/2014   Procedure: Abdominal Aortogram;  Surgeon: Serafina Mitchell, MD;  Location: [facility];  Service: Cardiovascular;  Laterality: N/A;  . PERIPHERAL VASCULAR CATHETERIZATION N/A 12/20/2015   Procedure: Abdominal Aortogram;  Surgeon: Serafina Mitchell, MD;  Location: [facility];  Service: Cardiovascular;  Laterality: N/A;  . PERIPHERAL VASCULAR CATHETERIZATION  12/20/2015   Procedure: Peripheral Vascular Balloon Angioplasty;  Surgeon: Serafina Mitchell, MD;  Location: [facility];  Service:  Cardiovascular;;    Allergies  Allergen Reactions  . Codeine Shortness Of Breath and Itching  . Gabapentin Other (See Comments)    Suicidal thoughts, extreme dreams  . Pregabalin     Suicidal thoughts,extreme dreams   . Simvastatin     Pain in joints  . Hydrocodone Nausea Only and Rash    Breathing problems   . Lisinopril Itching and Rash  . Venlafaxine Rash    Current Outpatient Medications  Medication Sig Dispense Refill  . aspirin EC 81 MG tablet Take 81 mg by mouth.    . carvedilol (COREG) 25 MG tablet Take 25 mg by mouth 2 (two) times daily with a meal.    . clobetasol cream (TEMOVATE) 6.44 % Apply 1 application topically 2 (two) times daily.    . empagliflozin (JARDIANCE) 25 MG TABS tablet Take 25 mg by mouth daily.    . metFORMIN (GLUCOPHAGE) 1000 MG tablet Take 1,000 mg by mouth 2 (two) times daily with a meal.     . Oxycodone HCl 10 MG TABS Take 10 mg by mouth.    . rosuvastatin (CRESTOR) 10 MG tablet Take 10 mg by mouth daily.  3  . triamcinolone cream (KENALOG) 0.1 % Apply 1 application topically 2 (two) times daily. 30 g 0  . clopidogrel (PLAVIX) 75 MG tablet Take 1 tablet (75 mg total) by mouth daily. (Patient not taking: Reported on 10/03/2017) 30 tablet 6   No current facility-administered medications for this visit.     ROS: See HPI for pertinent positives and negatives.   Physical Examination  Vitals:   10/03/17 0936 10/03/17 0939  BP: (!) 177/85 (!) 174/89  Pulse: (!) 46 (!) 45  Resp: 16   Temp: (!) 97 F (36.1 C)   TempSrc: Oral   SpO2: 96% 97%  Weight: 195 lb 14.4 oz (88.9 kg)   Height: 5\' 8"  (1.727 m)    Body mass index is 29.79 kg/m.  General: A&O x 3, WDWN, male in NAD. Gait: normal HENT: No gross abnormalities.  Eyes: PERRLA. Pulmonary: Respirations are non labored, CTAB, good air movement Cardiac: regular rhythm, no detected murmur.         Carotid Bruits Right Left   Negative Negative   Radial pulses are 2+ palpable  bilaterally   Adominal aortic pulse is not palpable                         VASCULAR EXAM: Extremities without ischemic changes, without Gangrene; without open wounds. Hemosiderin staining in a gaiter fashion only at his left lower leg, not on the right. No peripheral edema. No erythema.  LE Pulses Right Left       FEMORAL  faintly palpable  2+ palpable        POPLITEAL  not palpable   not palpable       POSTERIOR TIBIAL  not palpable   2+ palpable        DORSALIS PEDIS      ANTERIOR TIBIAL faintly palpable  not palpable    Abdomen: soft, NT, no palpable masses. Skin: no cellulitis, no ulcers noted. Several dry lesions on legs and forearms.  Musculoskeletal: no muscle wasting or atrophy.      Neurologic: A&O X 3; appropriate affect, Sensation is abnormal in his feet; MOTOR FUNCTION:  moving all extremities equally, motor strength 5/5 throughout. Speech is fluent/normal. CN 2-12 intact. Psychiatric: Thought content is normal, mood appropriate for clinical situation.     ASSESSMENT: [name] is a 57 y.o. male who is s/p drug coated balloon angioplasty or right SFA on 12-20-15 for end stent stenosis by Dr. Trula Slade.  He is also s/p stenting of right distal SFA and proximal popliteal artery occlusion, and stenting of right CIA on 12/28/2014.  About April 2019 left lower leg lateral aspect of calf started hurting after walking about 75 yards, relieved with rest, seems to be improving.  He denies any prolonged period of sitting or travel, denies dyspnea.    His ABI's are normal bilaterally with all biphasic waveforms; therefore the pain in his left calf is not from lack of arterial perfusion.  There is hemosiderin staining in a gaiter fashion only at his left lower leg, not on the right. Pt states the staining started  about 2 months ago, when the pain in his left calf started. No swelling no edema on either lower leg, no erythema.  Venous duplex of left lower extremity today shows no DVT, no superficial venous thrombosis, and no Baker's cyst.   His atherosclerotic risk factors include almost in control DM, continued smoking x 25 years, and CAD with hx of 2-3 MI's. He takes a daily statin, ASA, and Plavix.    DATA  Venous Duplex (10/03/17): Right: no no evidence of common femoral vein obstruction  Left: No DVT, no SVT. No cystic structure in the popliteal fossa.   Bilateral LE Arterial Duplex (10/01/17): Right: highest velocity is in mid stent at 254 cm/s, all triphasic waveforms except biphasic at mid stent.  Left: Highest velocity is mid popliteal at 288 cm/s; biphasic waveforms except triphasic at [employer], monophasic at distal peroneal.    ABI (Date: 10/01/2017):  R:  ? ABI: 1.01 (was 1.00 on 03-26-16),  ? PT: bi ? DP: bi ? TBI:  0.60 (was .61)  L:  ? ABI: 1.07 (was 1.03),  ? PT: bi ? DP: bi ? TBI: 0.57 (was 0.62) ? Bilateral ABI remain normal with all biphasic waveforms.  ? Bilateral TBI remain stable.     PLAN:  Based on the patient's vascular studies and examination, pt will return to clinic in 9 months with ABI's and right LE arterial duplex.   I discussed in depth with the patient the nature of atherosclerosis, and emphasized the importance of maximal medical management including strict control of blood pressure, blood glucose, and lipid levels, obtaining regular exercise, and cessation of smoking.  The patient is aware that without maximal medical management the underlying atherosclerotic disease process will progress, limiting the benefit of any interventions.  The patient was given information about PAD including signs, symptoms, treatment, what symptoms should  prompt the patient to seek immediate medical care, and risk reduction measures to take.  Clemon Chambers, RN,  MSN, FNP-C [facility] Phone: [phone]  Clinic MD: [facility]  10/03/17 10:07 AM

## 2017-10-03 NOTE — Patient Instructions (Signed)
Steps to Quit Smoking Smoking tobacco can be bad for your health. It can also affect almost every organ in your body. Smoking puts you and people around you at risk for many serious long-lasting (chronic) diseases. Quitting smoking is hard, but it is one of the best things that you can do for your health. It is never too late to quit. What are the benefits of quitting smoking? When you quit smoking, you lower your risk for getting serious diseases and conditions. They can include:  Lung cancer or lung disease.  Heart disease.  Stroke.  Heart attack.  Not being able to have children (infertility).  Weak bones (osteoporosis) and broken bones (fractures).  If you have coughing, wheezing, and shortness of breath, those symptoms may get better when you quit. You may also get sick less often. If you are pregnant, quitting smoking can help to lower your chances of having a baby of low birth weight. What can I do to help me quit smoking? Talk with your doctor about what can help you quit smoking. Some things you can do (strategies) include:  Quitting smoking totally, instead of slowly cutting back how much you smoke over a period of time.  Going to in-person counseling. You are more likely to quit if you go to many counseling sessions.  Using resources and support systems, such as: ? Online chats with a counselor. ? Phone quitlines. ? Printed self-help materials. ? Support groups or group counseling. ? Text messaging programs. ? Mobile phone apps or applications.  Taking medicines. Some of these medicines may have nicotine in them. If you are pregnant or breastfeeding, do not take any medicines to quit smoking unless your doctor says it is okay. Talk with your doctor about counseling or other things that can help you.  Talk with your doctor about using more than one strategy at the same time, such as taking medicines while you are also going to in-person counseling. This can help make  quitting easier. What things can I do to make it easier to quit? Quitting smoking might feel very hard at first, but there is a lot that you can do to make it easier. Take these steps:  Talk to your family and friends. Ask them to support and encourage you.  Call phone quitlines, reach out to support groups, or work with a counselor.  Ask people who smoke to not smoke around you.  Avoid places that make you want (trigger) to smoke, such as: ? Bars. ? Parties. ? Smoke-break areas at work.  Spend time with people who do not smoke.  Lower the stress in your life. Stress can make you want to smoke. Try these things to help your stress: ? Getting regular exercise. ? Deep-breathing exercises. ? Yoga. ? Meditating. ? Doing a body scan. To do this, close your eyes, focus on one area of your body at a time from head to toe, and notice which parts of your body are tense. Try to relax the muscles in those areas.  Download or buy apps on your mobile phone or tablet that can help you stick to your quit plan. There are many free apps, such as QuitGuide from the CDC (Centers for Disease Control and Prevention). You can find more support from smokefree.gov and other websites.  This information is not intended to replace advice given to you by your health care provider. Make sure you discuss any questions you have with your health care provider. Document Released: 02/03/2009 Document   Revised: 12/06/2015 Document Reviewed: 08/24/2014 Elsevier Interactive Patient Education  2018 Elsevier Inc.     Peripheral Vascular Disease Peripheral vascular disease (PVD) is a disease of the blood vessels that are not part of your heart and brain. A simple term for PVD is poor circulation. In most cases, PVD narrows the blood vessels that carry blood from your heart to the rest of your body. This can result in a decreased supply of blood to your arms, legs, and internal organs, like your stomach or kidneys.  However, it most often affects a person's lower legs and feet. There are two types of PVD.  Organic PVD. This is the more common type. It is caused by damage to the structure of blood vessels.  Functional PVD. This is caused by conditions that make blood vessels contract and tighten (spasm).  Without treatment, PVD tends to get worse over time. PVD can also lead to acute ischemic limb. This is when an arm or limb suddenly has trouble getting enough blood. This is a medical emergency. Follow these instructions at home:  Take medicines only as told by your doctor.  Do not use any tobacco products, including cigarettes, chewing tobacco, or electronic cigarettes. If you need help quitting, ask your doctor.  Lose weight if you are overweight, and maintain a healthy weight as told by your doctor.  Eat a diet that is low in fat and cholesterol. If you need help, ask your doctor.  Exercise regularly. Ask your doctor for some good activities for you.  Take good care of your feet. ? Wear comfortable shoes that fit well. ? Check your feet often for any cuts or sores. Contact a doctor if:  You have cramps in your legs while walking.  You have leg pain when you are at rest.  You have coldness in a leg or foot.  Your skin changes.  You are unable to get or have an erection (erectile dysfunction).  You have cuts or sores on your feet that are not healing. Get help right away if:  Your arm or leg turns cold and blue.  Your arms or legs become red, warm, swollen, painful, or numb.  You have chest pain or trouble breathing.  You suddenly have weakness in your face, arm, or leg.  You become very confused or you cannot speak.  You suddenly have a very bad headache.  You suddenly cannot see. This information is not intended to replace advice given to you by your health care provider. Make sure you discuss any questions you have with your health care provider. Document Released:  07/04/2009 Document Revised: 09/15/2015 Document Reviewed: 09/17/2013 Elsevier Interactive Patient Education  2017 Elsevier Inc.  

## 2018-06-12 ENCOUNTER — Emergency Department (HOSPITAL_COMMUNITY)
Admission: EM | Admit: 2018-06-12 | Discharge: 2018-06-12 | Disposition: A | Discharge status: Home / Self Care | Payer: Medicaid Other | Attending: Emergency Medicine | Admitting: Emergency Medicine

## 2018-06-12 ENCOUNTER — Encounter (HOSPITAL_COMMUNITY): Payer: Self-pay | Admitting: Emergency Medicine

## 2018-06-12 DIAGNOSIS — I10 Essential (primary) hypertension: Secondary | ICD-10-CM | POA: Insufficient documentation

## 2018-06-12 DIAGNOSIS — Z955 Presence of coronary angioplasty implant and graft: Secondary | ICD-10-CM | POA: Diagnosis not present

## 2018-06-12 DIAGNOSIS — E119 Type 2 diabetes mellitus without complications: Secondary | ICD-10-CM | POA: Insufficient documentation

## 2018-06-12 DIAGNOSIS — Z7984 Long term (current) use of oral hypoglycemic drugs: Secondary | ICD-10-CM | POA: Diagnosis not present

## 2018-06-12 DIAGNOSIS — M25572 Pain in left ankle and joints of left foot: Secondary | ICD-10-CM | POA: Insufficient documentation

## 2018-06-12 DIAGNOSIS — F17210 Nicotine dependence, cigarettes, uncomplicated: Secondary | ICD-10-CM | POA: Insufficient documentation

## 2018-06-12 DIAGNOSIS — R21 Rash and other nonspecific skin eruption: Secondary | ICD-10-CM | POA: Diagnosis present

## 2018-06-12 DIAGNOSIS — Z7982 Long term (current) use of aspirin: Secondary | ICD-10-CM | POA: Diagnosis not present

## 2018-06-12 DIAGNOSIS — I2510 Atherosclerotic heart disease of native coronary artery without angina pectoris: Secondary | ICD-10-CM | POA: Insufficient documentation

## 2018-06-12 DIAGNOSIS — M79662 Pain in left lower leg: Secondary | ICD-10-CM | POA: Insufficient documentation

## 2018-06-12 DIAGNOSIS — M25552 Pain in left hip: Secondary | ICD-10-CM | POA: Insufficient documentation

## 2018-06-12 LAB — CBC WITH DIFFERENTIAL/PLATELET
Abs Immature Granulocytes: 0.04 10*3/uL (ref 0.00–0.07)
BASOS ABS: 0.1 10*3/uL (ref 0.0–0.1)
Basophils Relative: 1 %
EOS ABS: 0.4 10*3/uL (ref 0.0–0.5)
Eosinophils Relative: 4 %
HCT: 49.7 % (ref 39.0–52.0)
Hemoglobin: 16.4 g/dL (ref 13.0–17.0)
Immature Granulocytes: 0 %
Lymphocytes Relative: 17 %
Lymphs Abs: 1.8 10*3/uL (ref 0.7–4.0)
MCH: 30.1 pg (ref 26.0–34.0)
MCHC: 33 g/dL (ref 30.0–36.0)
MCV: 91.4 fL (ref 80.0–100.0)
Monocytes Absolute: 1 10*3/uL (ref 0.1–1.0)
Monocytes Relative: 9 %
NEUTROS PCT: 69 %
NRBC: 0 % (ref 0.0–0.2)
Neutro Abs: 7.7 10*3/uL (ref 1.7–7.7)
Platelets: 262 10*3/uL (ref 150–400)
RBC: 5.44 MIL/uL (ref 4.22–5.81)
RDW: 15 % (ref 11.5–15.5)
WBC: 11 10*3/uL — ABNORMAL HIGH (ref 4.0–10.5)

## 2018-06-12 LAB — COMPREHENSIVE METABOLIC PANEL
ALBUMIN: 3.7 g/dL (ref 3.5–5.0)
ALT: 21 U/L (ref 0–44)
ANION GAP: 15 (ref 5–15)
AST: 17 U/L (ref 15–41)
Alkaline Phosphatase: 65 U/L (ref 38–126)
BUN: 20 mg/dL (ref 6–20)
CHLORIDE: 103 mmol/L (ref 98–111)
CO2: 20 mmol/L — ABNORMAL LOW (ref 22–32)
Calcium: 9 mg/dL (ref 8.9–10.3)
Creatinine, Ser: 0.98 mg/dL (ref 0.61–1.24)
GFR calc Af Amer: >60 mL/min (ref >60)
Glucose, Bld: 229 mg/dL — ABNORMAL HIGH (ref 70–99)
Potassium: 4.1 mmol/L (ref 3.5–5.1)
Sodium: 138 mmol/L (ref 135–145)
Total Bilirubin: 0.8 mg/dL (ref 0.3–1.2)
Total Protein: 6.6 g/dL (ref 6.5–8.1)

## 2018-06-12 MED ORDER — DOXYCYCLINE HYCLATE 100 MG PO CAPS: 100.0000 mg | ORAL_CAPSULE | Freq: Two times a day (BID) | ORAL | 0 refills | Status: DC

## 2018-06-12 MED ORDER — DEXAMETHASONE 4 MG PO TABS
8.0000 mg | ORAL_TABLET | Freq: Once | ORAL | Status: AC
  Administered 2018-06-12: 8 mg via ORAL
  Filled 2018-06-12: qty 2

## 2018-06-12 NOTE — ED Triage Notes (Signed)
Pt with left lower leg pain after walking on the tread mill. Now with redness and swelling and minimum bleeding, reports he's had cellulitis before. Pt denies any new injuries. He also reports a new generalized rash. No fevers.

## 2018-06-12 NOTE — ED Notes (Signed)
Patient verbalizes understanding of discharge instructions. Opportunity for questioning and answers were provided. Armband removed by staff, pt discharged from ED.  

## 2018-06-12 NOTE — ED Provider Notes (Signed)
[facility] EMERGENCY DEPARTMENT Provider Note   CSN: [id number] Arrival date & time: 06/12/18  1010    History   Chief Complaint Chief Complaint  Patient presents with  . Rash  . Cellulitis    HPI [name] is a 58 y.o. male with a history of PVD, hypertension, diabetes, CAD with multiple MIs and 1 stent ending to the emergency department today with chief complaint of rash on left lower leg x2 weeks.  Patient reports 2 weeks ago he was walking on the treadmill when he noticed a wound on his left lateral ankle.  He described it as the skin seemed dry and cracked.  He stopped exercising to let the wound heal however it has progressively worsened.  He describes the pain in the ankle as a throbbing pain.  It starts actually in his left hip and radiates down his leg.  The pain is been constant.  Patient used to see a pain clinic, but it close 6 months ago and he has not found a new clinic yet.  He has been taking ibuprofen for his pain.  He denies symptom relief.  Patient rates the pain 8 out of 10 in severity. Patient also states he has a similar rash on bilateral arms.  The rash on his arms is pruritic.   He is working on getting an appointment with vascular and vein specialist of [place].  He has seen Dr. Trula Slade in the past and had a stent placed behind his right knee for his PVD.  Denies fevers, chills, shortness of breath, chest pain.  Patient denies any changes laundry detergent, soaps, lotions  Past Medical History:  Diagnosis Date  . Anxiety   . CAD (coronary artery disease)   . Chronic back pain   . Colon polyps   . Diabetes mellitus   . Heart attack ([place])   . Hyperlipidemia   . Hypertension   . Panic attacks   . PVD (peripheral vascular disease) [facility])     Patient Active Problem List   Diagnosis Date Noted  . CAD (coronary artery disease) 08/29/2011  . Tobacco abuse 08/29/2011  . Hypertension 08/29/2011  . Hyperlipidemia 08/29/2011  .  Diabetes mellitus 08/29/2011    Past Surgical History:  Procedure Laterality Date  . CORONARY ANGIOPLASTY WITH STENT PLACEMENT    . EAR CYST EXCISION  12/10/2011   Procedure: CYST REMOVAL;  Surgeon: Gae Bon, DDS;  Location: [facility];  Service: Oral Surgery;  Laterality: Left;  Left Mandible Cyst Removal  . MULTIPLE EXTRACTIONS WITH ALVEOLOPLASTY  12/10/2011   Procedure: MULTIPLE EXTRACION WITH ALVEOLOPLASTY;  Surgeon: Gae Bon, DDS;  Location: [facility];  Service: Oral Surgery;  Laterality: N/A;  Right Maxillary Tuberosity Reduction; Right  Maxillary Buccal Exostosis; Bilateral Mandibular Tori   . PERIPHERAL VASCULAR CATHETERIZATION N/A 12/28/2014   Procedure: Abdominal Aortogram;  Surgeon: Serafina Mitchell, MD;  Location: [facility];  Service: Cardiovascular;  Laterality: N/A;  . PERIPHERAL VASCULAR CATHETERIZATION N/A 12/20/2015   Procedure: Abdominal Aortogram;  Surgeon: Serafina Mitchell, MD;  Location: [facility];  Service: Cardiovascular;  Laterality: N/A;  . PERIPHERAL VASCULAR CATHETERIZATION  12/20/2015   Procedure: Peripheral Vascular Balloon Angioplasty;  Surgeon: Serafina Mitchell, MD;  Location: [facility];  Service: Cardiovascular;;        Home Medications    Prior to Admission medications   Medication Sig Start Date End Date Taking? Authorizing Provider  aspirin EC 81  MG tablet Take 81 mg by mouth.   Yes [provider]  carvedilol (COREG) 25 MG tablet Take 25 mg by mouth 2 (two) times daily with a meal.   Yes [provider]  empagliflozin (JARDIANCE) 25 MG TABS tablet Take 25 mg by mouth daily.   Yes [provider]  ibuprofen (ADVIL,MOTRIN) 200 MG tablet Take 400 mg by mouth every 6 (six) hours as needed for moderate pain.   Yes [provider]  metFORMIN (GLUCOPHAGE) 1000 MG tablet Take 1,000 mg by mouth 2 (two) times daily with a meal.    Yes [provider]  rosuvastatin (CRESTOR) 10 MG tablet Take 10  mg by mouth daily. 02/23/17  Yes [provider]  triamcinolone cream (KENALOG) 0.1 % Apply 1 application topically 2 (two) times daily. Patient taking differently: Apply 1 application topically 2 (two) times daily as needed (rash).  04/06/17  Yes Virgel Manifold, MD  clopidogrel (PLAVIX) 75 MG tablet Take 1 tablet (75 mg total) by mouth daily. Patient not taking: Reported on 10/03/2017 12/12/15   Serafina Mitchell, MD  doxycycline (VIBRAMYCIN) 100 MG capsule Take 1 capsule (100 mg total) by mouth 2 (two) times daily for 7 days. 06/12/18 06/19/18  Albrizze, Harley Hallmark, PA-C    Family History Family History  Problem Relation Age of Onset  . Diabetes Father   . Kidney failure Father   . Hypertension Father   . Hyperlipidemia Father   . Heart disease Father     Social History Social History   Tobacco Use  . Smoking status: Current Every Day Smoker    Packs/day: 0.25    Years: 25.00    Pack years: 6.25    Types: Cigarettes  . Smokeless tobacco: Never Used  . Tobacco comment: 1/4 pack per day  Substance Use Topics  . Alcohol use: No    Alcohol/week: 0.0 standard drinks  . Drug use: No     Allergies   Codeine; Gabapentin; Pregabalin; Simvastatin; Hydrocodone; Lisinopril; and Venlafaxine   Review of Systems Review of Systems  Musculoskeletal: Positive for back pain.  Skin: Positive for rash and wound.  All other systems reviewed and are negative.    Physical Exam Updated Vital Signs BP 132/70   Pulse (!) 53   Temp 97.9 F (36.6 C) (Oral)   Resp 18   SpO2 94%   Physical Exam Vitals signs and nursing note reviewed.  Constitutional:      Appearance: He is not ill-appearing or toxic-appearing.  HENT:     Head: Normocephalic and atraumatic.     Nose: Nose normal.     Mouth/Throat:     Mouth: Mucous membranes are moist. No oral lesions.     Tongue: No lesions.     Palate: No lesions.     Pharynx: Oropharynx is clear.  Eyes:     Conjunctiva/sclera:  Conjunctivae normal.  Neck:     Musculoskeletal: Normal range of motion.  Cardiovascular:     Rate and Rhythm: Normal rate and regular rhythm.     Pulses:          Radial pulses are 2+ on the right side and 2+ on the left side.       Dorsalis pedis pulses are 2+ on the right side and detected w/ Doppler on the left side.     Heart sounds: Normal heart sounds.  Pulmonary:     Effort: Pulmonary effort is normal.     Breath sounds: Normal  breath sounds.  Abdominal:     General: There is no distension.     Palpations: Abdomen is soft.     Tenderness: There is no abdominal tenderness. There is no guarding or rebound.  Musculoskeletal: Normal range of motion.     Right lower leg: No edema.     Left lower leg: No edema.  Skin:    General: Skin is warm and dry.     Capillary Refill: Capillary refill takes less than 2 seconds.     Comments: Erythematous maculopapular rash.  Some scaling and excoriations.  Rash is primarily on extremities with lesions on his back that are scabbed.  Palm and soles are spared.  No mucous membrane involvement. As pictured.  Neurological:     Mental Status: He is alert. Mental status is at baseline.     Motor: No weakness.  Psychiatric:        Behavior: Behavior normal.              ED Treatments / Results  Labs (all labs ordered are listed, but only abnormal results are displayed) Labs Reviewed  CBC WITH DIFFERENTIAL/PLATELET - Abnormal; Notable for the following components:      Result Value   WBC 11.0 (*)    All other components within normal limits  COMPREHENSIVE METABOLIC PANEL - Abnormal; Notable for the following components:   CO2 20 (*)    Glucose, Bld 229 (*)    All other components within normal limits    EKG None  Radiology No results found.  Procedures Procedures (including critical care time)  Medications Ordered in ED Medications  dexamethasone (DECADRON) tablet 8 mg (8 mg Oral Given 06/12/18 1308)     Initial  Impression / Assessment and Plan / ED Course  I have reviewed the triage vital signs and the nursing notes.  Pertinent labs & imaging results that were available during my care of the patient were reviewed by me and considered in my medical decision making (see chart for details).   Patient is afebrile, nontoxic-appearing.  His CMP today shows elevated glucose of 229 otherwise unremarkable.  His CBC shows mild leukocytosis of 11. This is not the first time he is had the rash.  He has been seen for it in the emergency department in the past and treated with decadron and kenalog cream which he reports improved his symptoms.    Today we will treat with Decadron p.o. and discharge home with prescription for doxycycline for possible cellulitis.  Pt is an established patient of Dr. Stephens Shire.  He has stent placed in the right leg and has already discussed possibility for stent to be placed in the left leg.  He called his office this morning and is waiting for a call back to schedule an appointment for evaluation for this possible stent placement.  Also recommend PCP follow-up within the next week.  He has an upcoming appointment with his dermatologist on March 4, recommend he keeps that as scheduled.  Discussed strict ED return precautions. Pt and spouse verbalized understanding of and is in agreement with this plan. Pt stable for discharge home at this time. Pt case discussed with Dr. Stark Jock who agrees with my plan.    Final Clinical Impressions(s) / ED Diagnoses   Final diagnoses:  Rash    ED Discharge Orders         Ordered    doxycycline (VIBRAMYCIN) 100 MG capsule  2 times daily     06/12/18 1329  Cherre Robins, PA-C 06/12/18 1344    Veryl Speak, MD 06/12/18 [phone]

## 2018-06-16 ENCOUNTER — Emergency Department (HOSPITAL_COMMUNITY): Payer: Medicaid Other

## 2018-06-16 ENCOUNTER — Other Ambulatory Visit: Payer: Self-pay

## 2018-06-16 ENCOUNTER — Inpatient Hospital Stay (HOSPITAL_COMMUNITY)
Admission: EM | Admit: 2018-06-16 | Discharge: 2018-06-20 | DRG: 603 | Disposition: A | Discharge status: Home Health | Payer: Medicaid Other | Attending: Internal Medicine | Admitting: Internal Medicine

## 2018-06-16 ENCOUNTER — Encounter (HOSPITAL_COMMUNITY): Payer: Self-pay | Admitting: Emergency Medicine

## 2018-06-16 DIAGNOSIS — E119 Type 2 diabetes mellitus without complications: Secondary | ICD-10-CM

## 2018-06-16 DIAGNOSIS — I10 Essential (primary) hypertension: Secondary | ICD-10-CM | POA: Diagnosis present

## 2018-06-16 DIAGNOSIS — L309 Dermatitis, unspecified: Secondary | ICD-10-CM

## 2018-06-16 DIAGNOSIS — E785 Hyperlipidemia, unspecified: Secondary | ICD-10-CM | POA: Diagnosis present

## 2018-06-16 DIAGNOSIS — R609 Edema, unspecified: Secondary | ICD-10-CM | POA: Diagnosis not present

## 2018-06-16 DIAGNOSIS — I2510 Atherosclerotic heart disease of native coronary artery without angina pectoris: Secondary | ICD-10-CM | POA: Diagnosis present

## 2018-06-16 DIAGNOSIS — Z720 Tobacco use: Secondary | ICD-10-CM | POA: Diagnosis present

## 2018-06-16 DIAGNOSIS — F17210 Nicotine dependence, cigarettes, uncomplicated: Secondary | ICD-10-CM | POA: Diagnosis present

## 2018-06-16 DIAGNOSIS — E1151 Type 2 diabetes mellitus with diabetic peripheral angiopathy without gangrene: Secondary | ICD-10-CM | POA: Diagnosis present

## 2018-06-16 DIAGNOSIS — B9689 Other specified bacterial agents as the cause of diseases classified elsewhere: Secondary | ICD-10-CM | POA: Diagnosis present

## 2018-06-16 DIAGNOSIS — Z95820 Peripheral vascular angioplasty status with implants and grafts: Secondary | ICD-10-CM

## 2018-06-16 DIAGNOSIS — L03116 Cellulitis of left lower limb: Principal | ICD-10-CM

## 2018-06-16 DIAGNOSIS — L308 Other specified dermatitis: Secondary | ICD-10-CM | POA: Diagnosis present

## 2018-06-16 DIAGNOSIS — L089 Local infection of the skin and subcutaneous tissue, unspecified: Secondary | ICD-10-CM

## 2018-06-16 DIAGNOSIS — R262 Difficulty in walking, not elsewhere classified: Secondary | ICD-10-CM | POA: Diagnosis present

## 2018-06-16 DIAGNOSIS — Z8349 Family history of other endocrine, nutritional and metabolic diseases: Secondary | ICD-10-CM | POA: Diagnosis not present

## 2018-06-16 DIAGNOSIS — Z833 Family history of diabetes mellitus: Secondary | ICD-10-CM

## 2018-06-16 DIAGNOSIS — Z7984 Long term (current) use of oral hypoglycemic drugs: Secondary | ICD-10-CM

## 2018-06-16 DIAGNOSIS — Z841 Family history of disorders of kidney and ureter: Secondary | ICD-10-CM | POA: Diagnosis not present

## 2018-06-16 DIAGNOSIS — I252 Old myocardial infarction: Secondary | ICD-10-CM

## 2018-06-16 DIAGNOSIS — Z8249 Family history of ischemic heart disease and other diseases of the circulatory system: Secondary | ICD-10-CM

## 2018-06-16 DIAGNOSIS — Z7982 Long term (current) use of aspirin: Secondary | ICD-10-CM | POA: Diagnosis not present

## 2018-06-16 DIAGNOSIS — L0390 Cellulitis, unspecified: Secondary | ICD-10-CM | POA: Diagnosis not present

## 2018-06-16 DIAGNOSIS — I739 Peripheral vascular disease, unspecified: Secondary | ICD-10-CM | POA: Diagnosis not present

## 2018-06-16 DIAGNOSIS — Z955 Presence of coronary angioplasty implant and graft: Secondary | ICD-10-CM | POA: Diagnosis not present

## 2018-06-16 DIAGNOSIS — R52 Pain, unspecified: Secondary | ICD-10-CM | POA: Diagnosis not present

## 2018-06-16 LAB — COMPREHENSIVE METABOLIC PANEL
ALT: 23 U/L (ref 0–44)
AST: 29 U/L (ref 15–41)
Albumin: 3.7 g/dL (ref 3.5–5.0)
Alkaline Phosphatase: 59 U/L (ref 38–126)
Anion gap: 10 (ref 5–15)
BUN: 23 mg/dL — ABNORMAL HIGH (ref 6–20)
CO2: 20 mmol/L — ABNORMAL LOW (ref 22–32)
Calcium: 9.3 mg/dL (ref 8.9–10.3)
Chloride: 105 mmol/L (ref 98–111)
Creatinine, Ser: 0.95 mg/dL (ref 0.61–1.24)
GFR calc non Af Amer: >60 mL/min (ref >60)
Glucose, Bld: 262 mg/dL — ABNORMAL HIGH (ref 70–99)
Potassium: 4.9 mmol/L (ref 3.5–5.1)
Sodium: 135 mmol/L (ref 135–145)
Total Bilirubin: 1.3 mg/dL — ABNORMAL HIGH (ref 0.3–1.2)
Total Protein: 6.1 g/dL — ABNORMAL LOW (ref 6.5–8.1)

## 2018-06-16 LAB — CBC WITH DIFFERENTIAL/PLATELET
Abs Immature Granulocytes: 0.04 10*3/uL (ref 0.00–0.07)
Basophils Absolute: 0.1 10*3/uL (ref 0.0–0.1)
Basophils Relative: 1 %
Eosinophils Absolute: 0.3 10*3/uL (ref 0.0–0.5)
Eosinophils Relative: 3 %
HCT: 48.9 % (ref 39.0–52.0)
Hemoglobin: 15.7 g/dL (ref 13.0–17.0)
Immature Granulocytes: 0 %
Lymphocytes Relative: 16 %
Lymphs Abs: 1.5 10*3/uL (ref 0.7–4.0)
MCH: 29.3 pg (ref 26.0–34.0)
MCHC: 32.1 g/dL (ref 30.0–36.0)
MCV: 91.2 fL (ref 80.0–100.0)
Monocytes Absolute: 0.7 10*3/uL (ref 0.1–1.0)
Monocytes Relative: 8 %
Neutro Abs: 6.9 10*3/uL (ref 1.7–7.7)
Neutrophils Relative %: 72 %
Platelets: 286 10*3/uL (ref 150–400)
RBC: 5.36 MIL/uL (ref 4.22–5.81)
RDW: 15 % (ref 11.5–15.5)
WBC: 9.5 10*3/uL (ref 4.0–10.5)
nRBC: 0 % (ref 0.0–0.2)

## 2018-06-16 LAB — GLUCOSE, CAPILLARY
Glucose-Capillary: 254 mg/dL — ABNORMAL HIGH (ref 70–99)
Glucose-Capillary: 254 mg/dL — ABNORMAL HIGH (ref 70–99)

## 2018-06-16 LAB — HEMOGLOBIN A1C
Hgb A1c MFr Bld: 10.2 % — ABNORMAL HIGH (ref 4.8–5.6)
Mean Plasma Glucose: 246.04 mg/dL

## 2018-06-16 MED ORDER — ASPIRIN EC 81 MG PO TBEC
81.0000 mg | DELAYED_RELEASE_TABLET | Freq: Every day | ORAL | Status: DC
  Administered 2018-06-16 – 2018-06-20 (×5): 81 mg via ORAL
  Filled 2018-06-16 (×5): qty 1

## 2018-06-16 MED ORDER — MORPHINE SULFATE (PF) 4 MG/ML IV SOLN
4.0000 mg | Freq: Once | INTRAVENOUS | Status: AC
  Administered 2018-06-16: 4 mg via INTRAVENOUS
  Filled 2018-06-16: qty 1

## 2018-06-16 MED ORDER — CEFAZOLIN SODIUM-DEXTROSE 1-4 GM/50ML-% IV SOLN
1.0000 g | Freq: Once | INTRAVENOUS | Status: AC
  Administered 2018-06-16: 1 g via INTRAVENOUS
  Filled 2018-06-16: qty 50

## 2018-06-16 MED ORDER — ENOXAPARIN SODIUM 40 MG/0.4ML ~~LOC~~ SOLN
40.0000 mg | SUBCUTANEOUS | Status: DC
  Administered 2018-06-16 – 2018-06-19 (×4): 40 mg via SUBCUTANEOUS
  Filled 2018-06-16 (×4): qty 0.4

## 2018-06-16 MED ORDER — HYDROCERIN EX CREA
TOPICAL_CREAM | Freq: Every day | CUTANEOUS | Status: DC
  Filled 2018-06-16 (×2): qty 113

## 2018-06-16 MED ORDER — VANCOMYCIN HCL IN DEXTROSE 1-5 GM/200ML-% IV SOLN
1000.0000 mg | INTRAVENOUS | Status: AC
  Administered 2018-06-16: 1000 mg via INTRAVENOUS
  Filled 2018-06-16: qty 200

## 2018-06-16 MED ORDER — VANCOMYCIN HCL 10 G IV SOLR
1250.0000 mg | Freq: Two times a day (BID) | INTRAVENOUS | Status: DC
  Administered 2018-06-17: 1250 mg via INTRAVENOUS
  Filled 2018-06-16 (×2): qty 1250

## 2018-06-16 MED ORDER — ONDANSETRON HCL 4 MG/2ML IJ SOLN
4.0000 mg | Freq: Once | INTRAMUSCULAR | Status: AC
  Administered 2018-06-16: 4 mg via INTRAVENOUS
  Filled 2018-06-16: qty 2

## 2018-06-16 MED ORDER — SODIUM CHLORIDE 0.9 % IV SOLN
1.0000 g | Freq: Three times a day (TID) | INTRAVENOUS | Status: DC
  Administered 2018-06-16 – 2018-06-17 (×2): 1 g via INTRAVENOUS
  Filled 2018-06-16 (×4): qty 1

## 2018-06-16 MED ORDER — INSULIN ASPART 100 UNIT/ML ~~LOC~~ SOLN
0.0000 [IU] | Freq: Three times a day (TID) | SUBCUTANEOUS | Status: DC
  Administered 2018-06-17: 2 [IU] via SUBCUTANEOUS
  Administered 2018-06-17: 3 [IU] via SUBCUTANEOUS
  Administered 2018-06-17: 2 [IU] via SUBCUTANEOUS
  Administered 2018-06-18: 3 [IU] via SUBCUTANEOUS
  Administered 2018-06-18 (×2): 2 [IU] via SUBCUTANEOUS
  Administered 2018-06-19: 3 [IU] via SUBCUTANEOUS
  Administered 2018-06-19: 1 [IU] via SUBCUTANEOUS
  Administered 2018-06-19: 2 [IU] via SUBCUTANEOUS
  Administered 2018-06-20: 3 [IU] via SUBCUTANEOUS
  Administered 2018-06-20: 1 [IU] via SUBCUTANEOUS

## 2018-06-16 MED ORDER — EUCERIN EX CREA
TOPICAL_CREAM | Freq: Every day | CUTANEOUS | Status: DC
  Administered 2018-06-16 – 2018-06-20 (×5): via TOPICAL
  Filled 2018-06-16: qty 57

## 2018-06-16 MED ORDER — VANCOMYCIN HCL IN DEXTROSE 1-5 GM/200ML-% IV SOLN
1000.0000 mg | Freq: Once | INTRAVENOUS | Status: AC
  Administered 2018-06-16: 1000 mg via INTRAVENOUS
  Filled 2018-06-16: qty 200

## 2018-06-16 MED ORDER — CARVEDILOL 25 MG PO TABS
25.0000 mg | ORAL_TABLET | Freq: Two times a day (BID) | ORAL | Status: DC
  Administered 2018-06-16 – 2018-06-20 (×8): 25 mg via ORAL
  Filled 2018-06-16 (×8): qty 1

## 2018-06-16 MED ORDER — SODIUM CHLORIDE 0.9 % IV SOLN: 600.0000 mg | Freq: Two times a day (BID) | INTRAVENOUS | Status: DC

## 2018-06-16 MED ORDER — DIPHENHYDRAMINE HCL 25 MG PO CAPS
25.0000 mg | ORAL_CAPSULE | Freq: Four times a day (QID) | ORAL | Status: DC | PRN
  Administered 2018-06-17 – 2018-06-18 (×3): 25 mg via ORAL
  Filled 2018-06-16 (×3): qty 1

## 2018-06-16 MED ORDER — IBUPROFEN 200 MG PO TABS
400.0000 mg | ORAL_TABLET | Freq: Four times a day (QID) | ORAL | Status: DC | PRN
  Administered 2018-06-16: 400 mg via ORAL
  Filled 2018-06-16: qty 2

## 2018-06-16 MED ORDER — VANCOMYCIN HCL IN DEXTROSE 1-5 GM/200ML-% IV SOLN
1000.0000 mg | INTRAVENOUS | Status: DC
  Filled 2018-06-16: qty 200

## 2018-06-16 MED ORDER — INSULIN ASPART 100 UNIT/ML ~~LOC~~ SOLN
0.0000 [IU] | Freq: Every day | SUBCUTANEOUS | Status: DC
  Administered 2018-06-16: 3 [IU] via SUBCUTANEOUS
  Administered 2018-06-17: 2 [IU] via SUBCUTANEOUS
  Administered 2018-06-17: 3 [IU] via SUBCUTANEOUS
  Administered 2018-06-18: 2 [IU] via SUBCUTANEOUS
  Administered 2018-06-19: 3 [IU] via SUBCUTANEOUS

## 2018-06-16 MED ORDER — SODIUM CHLORIDE 0.9 % IV SOLN
INTRAVENOUS | Status: DC
  Administered 2018-06-16: 11:00:00 via INTRAVENOUS

## 2018-06-16 MED ORDER — ACETAMINOPHEN 500 MG PO TABS
1000.0000 mg | ORAL_TABLET | Freq: Four times a day (QID) | ORAL | Status: DC | PRN
  Administered 2018-06-17 – 2018-06-20 (×10): 1000 mg via ORAL
  Filled 2018-06-16 (×10): qty 2

## 2018-06-16 MED ORDER — ROSUVASTATIN CALCIUM 5 MG PO TABS
10.0000 mg | ORAL_TABLET | Freq: Every day | ORAL | Status: DC
  Administered 2018-06-16 – 2018-06-20 (×5): 10 mg via ORAL
  Filled 2018-06-16 (×5): qty 2

## 2018-06-16 NOTE — H&P (Signed)
History and Physical    [name] KGY:[id number] DOB: 06-Sep-1960 DOA: 06/16/2018  PCP: Concepcion Elk, MD  Patient coming from: home   I have personally briefly reviewed patient's old medical records available.   Chief Complaint: Swelling and redness of the left leg.  HPI: [name] is a 58 y.o. male with medical history significant of type 2 diabetes, chronic eczema, coronary artery disease, peripheral vascular disease, ongoing smoker, hypertension and hyperlipidemia who is presenting to the emergency room with worsening swelling of the left leg.  Patient does have recurrent eczematous dermatitis and has been suffering from this for more than 10 years.  He gets occasional flare every year.  He had in the past needed  IV antibiotics in the hospital.  Patient has developed itchy rashes all over the body, mostly below knees and below elbow areas since last 3 weeks.  Subsequently his left leg rash started getting worse, becoming red and inflamed and tender.  Denies any fever or chills.  Rashes are itchy.  No rashes on his webspace.  Lives with his mother and she does not itch.  Patient visited emergency room 2 days ago, was given doxycycline however no improvement so came back today. ED Course: Hemodynamically stable.  WBC count 9.5.  Blood glucose 262.  Due to failed outpatient therapy with oral antibiotics, given vancomycin and cefepime in the ER.  Need IV antibiotics due to significant symptoms.  Review of Systems: As per HPI otherwise 10 point review of systems negative.    Past Medical History:  Diagnosis Date  . Anxiety   . CAD (coronary artery disease)   . Chronic back pain   . Colon polyps   . Diabetes mellitus   . Heart attack ([place])   . Hyperlipidemia   . Hypertension   . Panic attacks   . PVD (peripheral vascular disease) ([place])     Past Surgical History:  Procedure Laterality Date  . CORONARY ANGIOPLASTY WITH STENT PLACEMENT    . EAR CYST EXCISION  12/10/2011   Procedure: CYST REMOVAL;  Surgeon: Gae Bon, DDS;  Location: [facility];  Service: Oral Surgery;  Laterality: Left;  Left Mandible Cyst Removal  . MULTIPLE EXTRACTIONS WITH ALVEOLOPLASTY  12/10/2011   Procedure: MULTIPLE EXTRACION WITH ALVEOLOPLASTY;  Surgeon: Gae Bon, DDS;  Location: [facility];  Service: Oral Surgery;  Laterality: N/A;  Right Maxillary Tuberosity Reduction; Right  Maxillary Buccal Exostosis; Bilateral Mandibular Tori   . PERIPHERAL VASCULAR CATHETERIZATION N/A 12/28/2014   Procedure: Abdominal Aortogram;  Surgeon: Serafina Mitchell, MD;  Location: [facility];  Service: Cardiovascular;  Laterality: N/A;  . PERIPHERAL VASCULAR CATHETERIZATION N/A 12/20/2015   Procedure: Abdominal Aortogram;  Surgeon: Serafina Mitchell, MD;  Location: [facility];  Service: Cardiovascular;  Laterality: N/A;  . PERIPHERAL VASCULAR CATHETERIZATION  12/20/2015   Procedure: Peripheral Vascular Balloon Angioplasty;  Surgeon: Serafina Mitchell, MD;  Location: [facility];  Service: Cardiovascular;;     reports that he has been smoking cigarettes. He has a 6.25 pack-year smoking history. He has never used smokeless tobacco. He reports that he does not drink alcohol or use drugs.  Allergies  Allergen Reactions  . Codeine Shortness Of Breath and Itching  . Gabapentin Other (See Comments)    Suicidal thoughts, extreme dreams  . Pregabalin     Suicidal thoughts,extreme dreams   . Simvastatin     Pain in joints  . Hydrocodone Nausea Only and Rash  Breathing problems   . Lisinopril Itching and Rash  . Venlafaxine Rash    Family History  Problem Relation Age of Onset  . Diabetes Father   . Kidney failure Father   . Hypertension Father   . Hyperlipidemia Father   . Heart disease Father      Prior to Admission medications   Medication Sig Start Date End Date Taking? Authorizing Provider  acetaminophen (TYLENOL) 500 MG tablet Take 1,000 mg by mouth every 6 (six) hours as needed  for headache.   Yes [provider]  aspirin EC 81 MG tablet Take 81 mg by mouth daily.    Yes [provider]  carvedilol (COREG) 25 MG tablet Take 25 mg by mouth 2 (two) times daily with a meal.   Yes [provider]  doxycycline (VIBRAMYCIN) 100 MG capsule Take 1 capsule (100 mg total) by mouth 2 (two) times daily for 7 days. 06/12/18 06/19/18 Yes Albrizze, Kaitlyn E, PA-C  empagliflozin (JARDIANCE) 25 MG TABS tablet Take 25 mg by mouth daily.   Yes [provider]  ibuprofen (ADVIL,MOTRIN) 200 MG tablet Take 400 mg by mouth every 6 (six) hours as needed for moderate pain.   Yes [provider]  metFORMIN (GLUCOPHAGE) 1000 MG tablet Take 1,000 mg by mouth 2 (two) times daily with a meal.    Yes [provider]  rosuvastatin (CRESTOR) 10 MG tablet Take 10 mg by mouth daily. 02/23/17  Yes [provider]  triamcinolone cream (KENALOG) 0.1 % Apply 1 application topically 2 (two) times daily. Patient taking differently: Apply 1 application topically 2 (two) times daily as needed (rash).  04/06/17  Yes Virgel Manifold, MD    Physical Exam: Vitals:   06/16/18 1300 06/16/18 1315 06/16/18 1330 06/16/18 1345  BP: (!) 165/88 (!) 142/74 (!) 150/72 (!) 164/89  Pulse: (!) 52 (!) 58 66 (!) 104  Resp: 11 16 20 13   Temp:      TempSrc:      SpO2: 96% 95% 93% 93%    Constitutional: NAD, calm, comfortable Vitals:   06/16/18 1300 06/16/18 1315 06/16/18 1330 06/16/18 1345  BP: (!) 165/88 (!) 142/74 (!) 150/72 (!) 164/89  Pulse: (!) 52 (!) 58 66 (!) 104  Resp: 11 16 20 13   Temp:      TempSrc:      SpO2: 96% 95% 93% 93%   Eyes: PERRL, lids and conjunctivae normal ENMT: Mucous membranes are moist. Posterior pharynx clear of any exudate or lesions.Normal dentition.  Neck: normal, supple, no masses, no thyromegaly Respiratory: clear to auscultation bilaterally, no wheezing, no crackles. Normal respiratory effort. No accessory muscle use.    Cardiovascular: Regular rate and rhythm, no murmurs / rubs / gallops. No extremity edema. 2+ pedal pulses. No carotid bruits.  Abdomen: no tenderness, no masses palpated. No hepatosplenomegaly. Bowel sounds positive.  Musculoskeletal: no clubbing / cyanosis. No joint deformity upper and lower extremities. Good ROM, no contractures. Normal muscle tone.  Skin: no rashes, lesions, ulcers. No induration Neurologic: CN 2-12 grossly intact. Sensation intact, DTR normal. Strength 5/5 in all 4.  Psychiatric: Normal judgment and insight. Alert and oriented x 3. Normal mood.       Labs on Admission: I have personally reviewed following labs and imaging studies  CBC: Recent Labs  Lab 06/12/18 1032 06/16/18 1106  WBC 11.0* 9.5  NEUTROABS 7.7 6.9  HGB 16.4 15.7  HCT 49.7 48.9  MCV 91.4 91.2  PLT 262 286  Basic Metabolic Panel: Recent Labs  Lab 06/12/18 1032 06/16/18 1106  NA 138 135  K 4.1 4.9  CL 103 105  CO2 20* 20*  GLUCOSE 229* 262*  BUN 20 23*  CREATININE 0.98 0.95  CALCIUM 9.0 9.3   GFR: CrCl cannot be calculated (Unknown ideal weight.). Liver Function Tests: Recent Labs  Lab 06/12/18 1032 06/16/18 1106  AST 17 29  ALT 21 23  ALKPHOS 65 59  BILITOT 0.8 1.3*  PROT 6.6 6.1*  ALBUMIN 3.7 3.7   No results for input(s): LIPASE, AMYLASE in the last 168 hours. No results for input(s): AMMONIA in the last 168 hours. Coagulation Profile: No results for input(s): INR, PROTIME in the last 168 hours. Cardiac Enzymes: No results for input(s): CKTOTAL, CKMB, CKMBINDEX, TROPONINI in the last 168 hours. BNP (last 3 results) No results for input(s): PROBNP in the last 8760 hours. HbA1C: No results for input(s): HGBA1C in the last 72 hours. CBG: No results for input(s): GLUCAP in the last 168 hours. Lipid Profile: No results for input(s): CHOL, HDL, LDLCALC, TRIG, CHOLHDL, LDLDIRECT in the last 72 hours. Thyroid Function Tests: No results for input(s): TSH, T4TOTAL,  FREET4, T3FREE, THYROIDAB in the last 72 hours. Anemia Panel: No results for input(s): VITAMINB12, FOLATE, FERRITIN, TIBC, IRON, RETICCTPCT in the last 72 hours. Urine analysis:    Component Value Date/Time   COLORURINE YELLOW 01/30/2011 1501   APPEARANCEUR CLEAR 01/30/2011 1501   LABSPEC 1.022 01/30/2011 1501   PHURINE 5.0 01/30/2011 1501   GLUCOSEU NEGATIVE 01/30/2011 1501   HGBUR NEGATIVE 01/30/2011 1501   BILIRUBINUR NEGATIVE 01/30/2011 1501   KETONESUR NEGATIVE 01/30/2011 1501   PROTEINUR NEGATIVE 01/30/2011 1501   UROBILINOGEN 0.2 01/30/2011 1501   NITRITE NEGATIVE 01/30/2011 1501   LEUKOCYTESUR NEGATIVE 01/30/2011 1501    Radiological Exams on Admission: Vas [place] Lower Extremity Venous (dvt) (mc And Wl 7a-7p)  Result Date: 06/16/2018  Lower Venous Study Indications: Edema, and cellulitis.  Comparison Study: Prior study from 10/03/17 is available for comparison Performing Technologist: Sharion Dove RVS  Examination Guidelines: A complete evaluation includes B-mode imaging, spectral Doppler, color Doppler, and power Doppler as needed of all accessible portions of each vessel. Bilateral testing is considered an integral part of a complete examination. Limited examinations for reoccurring indications may be performed as noted.  Right Venous Findings: +---+---------------+---------+-----------+----------+-------+    CompressibilityPhasicitySpontaneityPropertiesSummary +---+---------------+---------+-----------+----------+-------+ CFVFull           Yes      Yes                          +---+---------------+---------+-----------+----------+-------+  Left Venous Findings: +---------+---------------+---------+-----------+----------+-------+          CompressibilityPhasicitySpontaneityPropertiesSummary +---------+---------------+---------+-----------+----------+-------+ CFV      Full           Yes      Yes                           +---------+---------------+---------+-----------+----------+-------+ SFJ      Full                                                 +---------+---------------+---------+-----------+----------+-------+ FV Prox  Full                                                 +---------+---------------+---------+-----------+----------+-------+  FV Mid   Full                                                 +---------+---------------+---------+-----------+----------+-------+ FV DistalFull                                                 +---------+---------------+---------+-----------+----------+-------+ PFV      Full                                                 +---------+---------------+---------+-----------+----------+-------+ POP      Full           Yes      Yes                          +---------+---------------+---------+-----------+----------+-------+ PTV      Full                                                 +---------+---------------+---------+-----------+----------+-------+ PERO     Full                                                 +---------+---------------+---------+-----------+----------+-------+ GSV      Full                                                 +---------+---------------+---------+-----------+----------+-------+    Summary: Right: No evidence of common femoral vein obstruction. Left: There is no evidence of deep vein thrombosis in the lower extremity.There is no evidence of superficial venous thrombosis. Ultrasound characteristics of enlarged lymph nodes noted in the groin.  *See table(s) above for measurements and observations.    Preliminary      Assessment/Plan Principal Problem:   Superficial bacterial skin infection Active Problems:   CAD (coronary artery disease)   Tobacco abuse   Hypertension   Hyperlipidemia   Diabetes mellitus ([place])   Bacterial skin infection     1.  Superficial bacterial skin infection with  cellulitis: Suspect acute bacterial superadded infection with underlying chronic eczematous disease in a diabetic patient. Failed oral doxycycline therapy. Admit.  Will use Teflaro for broad coverage as patient is also diabetic. Does not have local drain. Taking antibiotics and prolonged symptoms, Blood cultures will not have any yield. Duplexes were negative for DVT. We will use symptomatic treatment with Benadryl to stop itching. Wound care consultation to formulate wound care and dressing plan at home.  2.  Type 2 diabetes: Unknown control.  Keep on sliding scale insulin.  Holding metformin and Jardiance.  Will keep on sliding scale insulin and check A1c.  May need up titration of the treatment.  3.  Hypertension: Fairly stable.  Resume home medications.  4.  Peripheral vascular disease/ coronary artery disease: Patient on aspirin.  On statin.  Continues to smoke.  Currently without chest pain.  5.  Smoker: Patient continues to smoke. We discussed in brief about adverse health outcomes with continuous smoking.  Counseling provided.  Patient will be provided with written counseling and instructions for smoking cessation.  He does not want nicotine patch.    DVT prophylaxis: Subcu Lovenox. Code Status: Full code. Family Communication: No family at bedside. Disposition Plan: Home when is stable. Consults called: None.  Wound care. Admission status: Inpatient.   Barb Merino MD [facility] Pager [phone]  If 7PM-7AM, please contact night-coverage [email or web address] Password [facility]  06/16/2018, 3:09 PM

## 2018-06-16 NOTE — ED Notes (Signed)
ED Provider at bedside. 

## 2018-06-16 NOTE — Consult Note (Addendum)
[facility] Nurse wound consult note Reason for Consult: Consult requested for legs.  Reviewed photos and progress notes in the EMR.  Pt recently developed cellulitis, according to the progress notes, and has generalized edema and erythremia to calf areas; patchy areas of red moist partial thickness skin loss and dry peeling skin. Dressing procedure/placement/frequency: Topical treatment is minimally effective for cellulitis; systemic antibiotics promote healing.  Orders provided for Eucerin cream to promote healing. Pt could benefit from use of long-term compression stockings after edema recedes. Please re-consult if further assistance is needed.  Thank-you,  Julien Girt MSN, [place], [place], [place], [place]

## 2018-06-16 NOTE — ED Notes (Signed)
Patient transported to Ultrasound 

## 2018-06-16 NOTE — ED Provider Notes (Signed)
[place] EMERGENCY DEPARTMENT Provider Note   CSN: [id number] Arrival date & time: 06/16/18  1017    History   Chief Complaint Chief Complaint  Patient presents with  . Leg Pain    HPI [name] is a 58 y.o. male.     Patient with hx pvd, presents c/o persistent pain, swelling, and redness to left lower leg. States symptoms started with recurrent rash to bilateral arms and legs a week ago. Rash is mildly pruritic at onset, but states involvement of LLE is worse. Indicates has hx same rash in past, 3-4 times, and that some lesions are chronically present to extremities. Was seen in ED 4 days ago, given rx doxy - indicates is taking, and while some lesions have dried, the area to left lower leg seems more swollen, and redness/pain persist. Subjective fever. No nv.   The history is provided by the patient.  Leg Pain  Associated symptoms: no back pain     Past Medical History:  Diagnosis Date  . Anxiety   . CAD (coronary artery disease)   . Chronic back pain   . Colon polyps   . Diabetes mellitus   . Heart attack ([place])   . Hyperlipidemia   . Hypertension   . Panic attacks   . PVD (peripheral vascular disease) [facility])     Patient Active Problem List   Diagnosis Date Noted  . CAD (coronary artery disease) 08/29/2011  . Tobacco abuse 08/29/2011  . Hypertension 08/29/2011  . Hyperlipidemia 08/29/2011  . Diabetes mellitus 08/29/2011    Past Surgical History:  Procedure Laterality Date  . CORONARY ANGIOPLASTY WITH STENT PLACEMENT    . EAR CYST EXCISION  12/10/2011   Procedure: CYST REMOVAL;  Surgeon: Gae Bon, DDS;  Location: [place];  Service: Oral Surgery;  Laterality: Left;  Left Mandible Cyst Removal  . MULTIPLE EXTRACTIONS WITH ALVEOLOPLASTY  12/10/2011   Procedure: MULTIPLE EXTRACION WITH ALVEOLOPLASTY;  Surgeon: Gae Bon, DDS;  Location: [place];  Service: Oral Surgery;  Laterality: N/A;  Right Maxillary Tuberosity Reduction; Right   Maxillary Buccal Exostosis; Bilateral Mandibular Tori   . PERIPHERAL VASCULAR CATHETERIZATION N/A 12/28/2014   Procedure: Abdominal Aortogram;  Surgeon: Serafina Mitchell, MD;  Location: [place] CV [facility];  Service: Cardiovascular;  Laterality: N/A;  . PERIPHERAL VASCULAR CATHETERIZATION N/A 12/20/2015   Procedure: Abdominal Aortogram;  Surgeon: Serafina Mitchell, MD;  Location: [place] [facility];  Service: Cardiovascular;  Laterality: N/A;  . PERIPHERAL VASCULAR CATHETERIZATION  12/20/2015   Procedure: Peripheral Vascular Balloon Angioplasty;  Surgeon: Serafina Mitchell, MD;  Location: [place] [facility];  Service: Cardiovascular;;        Home Medications    Prior to Admission medications   Medication Sig Start Date End Date Taking? Authorizing Provider  aspirin EC 81 MG tablet Take 81 mg by mouth.    [provider]  carvedilol (COREG) 25 MG tablet Take 25 mg by mouth 2 (two) times daily with a meal.    [provider]  clopidogrel (PLAVIX) 75 MG tablet Take 1 tablet (75 mg total) by mouth daily. Patient not taking: Reported on 10/03/2017 12/12/15   Serafina Mitchell, MD  doxycycline (VIBRAMYCIN) 100 MG capsule Take 1 capsule (100 mg total) by mouth 2 (two) times daily for 7 days. 06/12/18 06/19/18  Albrizze, Kaitlyn E, PA-C  empagliflozin (JARDIANCE) 25 MG TABS tablet Take 25 mg by mouth daily.    [provider]  ibuprofen (ADVIL,MOTRIN) 200 MG tablet Take 400 mg by mouth every 6 (six) hours as needed for moderate pain.    [provider]  metFORMIN (GLUCOPHAGE) 1000 MG tablet Take 1,000 mg by mouth 2 (two) times daily with a meal.     [provider]  rosuvastatin (CRESTOR) 10 MG tablet Take 10 mg by mouth daily. 02/23/17   [provider]  triamcinolone cream (KENALOG) 0.1 % Apply 1 application topically 2 (two) times daily. Patient taking differently: Apply 1 application topically 2 (two) times daily as needed (rash).  04/06/17   Virgel Manifold, MD    Family History Family History  Problem Relation Age of Onset  . Diabetes Father   . Kidney failure Father   . Hypertension Father   . Hyperlipidemia Father   . Heart disease Father     Social History Social History   Tobacco Use  . Smoking status: Current Every Day Smoker    Packs/day: 0.25    Years: 25.00    Pack years: 6.25    Types: Cigarettes  . Smokeless tobacco: Never Used  . Tobacco comment: 1/4 pack per day  Substance Use Topics  . Alcohol use: No    Alcohol/week: 0.0 standard drinks  . Drug use: No     Allergies   Codeine; Gabapentin; Pregabalin; Simvastatin; Hydrocodone; Lisinopril; and Venlafaxine   Review of Systems Review of Systems  Constitutional: Negative for chills.  HENT: Negative for sore throat.   Eyes: Negative for redness.  Respiratory: Negative for shortness of breath.   Cardiovascular: Negative for chest pain.  Gastrointestinal: Negative for abdominal pain and vomiting.  Genitourinary: Negative for flank pain.  Musculoskeletal: Negative for back pain.  Skin: Negative for rash.  Neurological: Negative for numbness and headaches.  Hematological: Does not bruise/bleed easily.  Psychiatric/Behavioral: Negative for confusion.     Physical Exam Updated Vital Signs BP (!) 177/117 (BP Location: Right Arm)   Pulse 80   Temp 97.7 F (36.5 C) (Oral)   Resp 18   SpO2 96%   Physical Exam Vitals signs and nursing note reviewed.  Constitutional:      Appearance: Normal appearance. He is well-developed.  HENT:     Head: Atraumatic.     Nose: Nose normal.     Mouth/Throat:     Mouth: Mucous membranes are moist.     Pharynx: Oropharynx is clear.  Eyes:     General: No scleral icterus.    Conjunctiva/sclera: Conjunctivae normal.     Pupils: Pupils are equal, round, and reactive to light.  Neck:     Musculoskeletal: Normal range of motion and neck supple. No neck rigidity.     Trachea: No tracheal deviation.    Cardiovascular:     Rate and Rhythm: Normal rate and regular rhythm.     Pulses: Normal pulses.     Heart sounds: Normal heart sounds. No murmur. No friction rub. No gallop.   Pulmonary:     Effort: Pulmonary effort is normal. No accessory muscle usage or respiratory distress.     Breath sounds: Normal breath sounds.  Abdominal:     General: Bowel sounds are normal. There is no distension.     Palpations: Abdomen is soft.     Tenderness: There is no abdominal tenderness. There is no guarding.  Genitourinary:    Comments: No cva tenderness. Musculoskeletal:        General: No swelling.  Skin:    General: Skin is warm and  dry.     Findings: No rash.     Comments: Erythematous, scaly lesions to bil forearms and bilateral lower legs. LLE is diffusely erythematous and swollen, with some lesions weeping/draining scant amount of fluid. Increased warmth to LLE. Foot is warm. doppl pulses present.   Neurological:     Mental Status: He is alert.     Comments: Alert, speech clear.   Psychiatric:        Mood and Affect: Mood normal.      ED Treatments / Results  Labs (all labs ordered are listed, but only abnormal results are displayed) Results for orders placed or performed during the hospital encounter of 06/16/18  CBC with Differential/Platelet  Result Value Ref Range   WBC 9.5 4.0 - 10.5 K/uL   RBC 5.36 4.22 - 5.81 MIL/uL   Hemoglobin 15.7 13.0 - 17.0 g/dL   HCT 48.9 39.0 - 52.0 %   MCV 91.2 80.0 - 100.0 fL   MCH 29.3 26.0 - 34.0 pg   MCHC 32.1 30.0 - 36.0 g/dL   RDW 15.0 11.5 - 15.5 %   Platelets 286 150 - 400 K/uL   nRBC 0.0 0.0 - 0.2 %   Neutrophils Relative % 72 %   Neutro Abs 6.9 1.7 - 7.7 K/uL   Lymphocytes Relative 16 %   Lymphs Abs 1.5 0.7 - 4.0 K/uL   Monocytes Relative 8 %   Monocytes Absolute 0.7 0.1 - 1.0 K/uL   Eosinophils Relative 3 %   Eosinophils Absolute 0.3 0.0 - 0.5 K/uL   Basophils Relative 1 %   Basophils Absolute 0.1 0.0 - 0.1 K/uL   Immature  Granulocytes 0 %   Abs Immature Granulocytes 0.04 0.00 - 0.07 K/uL  Comprehensive metabolic panel  Result Value Ref Range   Sodium 135 135 - 145 mmol/L   Potassium 4.9 3.5 - 5.1 mmol/L   Chloride 105 98 - 111 mmol/L   CO2 20 (L) 22 - 32 mmol/L   Glucose, Bld 262 (H) 70 - 99 mg/dL   BUN 23 (H) 6 - 20 mg/dL   Creatinine, Ser 0.95 0.61 - 1.24 mg/dL   Calcium 9.3 8.9 - 10.3 mg/dL   Total Protein 6.1 (L) 6.5 - 8.1 g/dL   Albumin 3.7 3.5 - 5.0 g/dL   AST 29 15 - 41 U/L   ALT 23 0 - 44 U/L   Alkaline Phosphatase 59 38 - 126 U/L   Total Bilirubin 1.3 (H) 0.3 - 1.2 mg/dL   GFR calc non Af Amer >60 >60 mL/min   GFR calc Af Amer >60 >60 mL/min   Anion gap 10 5 - 15   Vas [place] Lower Extremity Venous (dvt) (mc And Wl 7a-7p)  Result Date: 06/16/2018  Lower Venous Study Indications: Edema, and cellulitis.  Comparison Study: Prior study from 10/03/17 is available for comparison Performing Technologist: Sharion Dove RVS  Examination Guidelines: A complete evaluation includes B-mode imaging, spectral Doppler, color Doppler, and power Doppler as needed of all accessible portions of each vessel. Bilateral testing is considered an integral part of a complete examination. Limited examinations for reoccurring indications may be performed as noted.  Right Venous Findings: +---+---------------+---------+-----------+----------+-------+    CompressibilityPhasicitySpontaneityPropertiesSummary +---+---------------+---------+-----------+----------+-------+ CFVFull           Yes      Yes                          +---+---------------+---------+-----------+----------+-------+  Left Venous Findings: +---------+---------------+---------+-----------+----------+-------+  CompressibilityPhasicitySpontaneityPropertiesSummary +---------+---------------+---------+-----------+----------+-------+ CFV      Full           Yes      Yes                           +---------+---------------+---------+-----------+----------+-------+ SFJ      Full                                                 +---------+---------------+---------+-----------+----------+-------+ FV Prox  Full                                                 +---------+---------------+---------+-----------+----------+-------+ FV Mid   Full                                                 +---------+---------------+---------+-----------+----------+-------+ FV DistalFull                                                 +---------+---------------+---------+-----------+----------+-------+ PFV      Full                                                 +---------+---------------+---------+-----------+----------+-------+ POP      Full           Yes      Yes                          +---------+---------------+---------+-----------+----------+-------+ PTV      Full                                                 +---------+---------------+---------+-----------+----------+-------+ PERO     Full                                                 +---------+---------------+---------+-----------+----------+-------+ GSV      Full                                                 +---------+---------------+---------+-----------+----------+-------+    Summary: Right: No evidence of common femoral vein obstruction. Left: There is no evidence of deep vein thrombosis in the lower extremity.There is no evidence of superficial venous thrombosis. Ultrasound characteristics of enlarged lymph nodes noted in the groin.  *See table(s) above for measurements and observations.    Preliminary     EKG None  Radiology Vas [place] Lower Extremity Venous (dvt) (mc And Wl 7a-7p)  Result Date: 06/16/2018  Lower Venous Study Indications: Edema, and cellulitis.  Comparison Study: Prior study from 10/03/17 is available for comparison Performing Technologist: Sharion Dove RVS  Examination  Guidelines: A complete evaluation includes B-mode imaging, spectral Doppler, color Doppler, and power Doppler as needed of all accessible portions of each vessel. Bilateral testing is considered an integral part of a complete examination. Limited examinations for reoccurring indications may be performed as noted.  Right Venous Findings: +---+---------------+---------+-----------+----------+-------+    CompressibilityPhasicitySpontaneityPropertiesSummary +---+---------------+---------+-----------+----------+-------+ CFVFull           Yes      Yes                          +---+---------------+---------+-----------+----------+-------+  Left Venous Findings: +---------+---------------+---------+-----------+----------+-------+          CompressibilityPhasicitySpontaneityPropertiesSummary +---------+---------------+---------+-----------+----------+-------+ CFV      Full           Yes      Yes                          +---------+---------------+---------+-----------+----------+-------+ SFJ      Full                                                 +---------+---------------+---------+-----------+----------+-------+ FV Prox  Full                                                 +---------+---------------+---------+-----------+----------+-------+ FV Mid   Full                                                 +---------+---------------+---------+-----------+----------+-------+ FV DistalFull                                                 +---------+---------------+---------+-----------+----------+-------+ PFV      Full                                                 +---------+---------------+---------+-----------+----------+-------+ POP      Full           Yes      Yes                          +---------+---------------+---------+-----------+----------+-------+ PTV      Full                                                  +---------+---------------+---------+-----------+----------+-------+ PERO     Full                                                 +---------+---------------+---------+-----------+----------+-------+  GSV      Full                                                 +---------+---------------+---------+-----------+----------+-------+    Summary: Right: No evidence of common femoral vein obstruction. Left: There is no evidence of deep vein thrombosis in the lower extremity.There is no evidence of superficial venous thrombosis. Ultrasound characteristics of enlarged lymph nodes noted in the groin.  *See table(s) above for measurements and observations.    Preliminary     Procedures Procedures (including critical care time)  Medications Ordered in ED Medications  0.9 %  sodium chloride infusion (has no administration in time range)     Initial Impression / Assessment and Plan / ED Course  I have reviewed the triage vital signs and the nursing notes.  Pertinent labs & imaging results that were available during my care of the patient were reviewed by me and considered in my medical decision making (see chart for details).  Iv ns. Labs.   Vascular doppler.   Reviewed nursing notes and prior charts for additional history.   Vascular dopplers reviewed - neg for dvt.   Pt notes worsening pain, erythema, and swelling to left lower leg despite outpatient abx therapy - will tx with iv abx, and admit.   Unassigned medicine consulted for admission.  Pt requests pain med. Morphine iv.     Final Clinical Impressions(s) / ED Diagnoses   Final diagnoses:  None    ED Discharge Orders    None       Lajean Saver, MD 06/16/18 1354

## 2018-06-16 NOTE — Plan of Care (Signed)

## 2018-06-16 NOTE — ED Triage Notes (Signed)
Pt presents back to ER for left lower leg swelling and pain, he was seen on Thursday for the same. He reports receiving steroids and antibiotic which have helped dry up the cellulitis and skin rash but pain, swelling and itching still persists. He was unable to see his PCP today.

## 2018-06-16 NOTE — Progress Notes (Signed)
Pharmacy Antibiotic Note  [name] is a 58 y.o. male admitted on 06/16/2018 with cellulitis.  Pt was seen in ED 2/22 and started on PO Doxy without improvement.  Pharmacy has been consulted for Vancomycin and Cefepime dosing.  Received Vanc 1gm and Ancef 1gm in ED (2/24 1420).   Plan: Cefepime 1gm IV q8h Vancomycin 1gm IV x 1 now to complete loading dose. Vancomycin 1250 mg IV Q 12 hrs starting 5/25 at 0600. Goal AUC 400-550. Expected AUC: 532.3 SCr used: 0.95     Temp (24hrs), Avg:97.9 F (36.6 C), Min:97.7 F (36.5 C), Max:98 F (36.7 C)  Recent Labs  Lab 06/12/18 1032 06/16/18 1106  WBC 11.0* 9.5  CREATININE 0.98 0.95    CrCl cannot be calculated (Unknown ideal weight.).    Allergies  Allergen Reactions  . Codeine Shortness Of Breath and Itching  . Gabapentin Other (See Comments)    Suicidal thoughts, extreme dreams  . Pregabalin     Suicidal thoughts,extreme dreams   . Simvastatin     Pain in joints  . Hydrocodone Nausea Only and Rash    Breathing problems   . Lisinopril Itching and Rash  . Venlafaxine Rash    Antimicrobials this admission: PO Doxy PTA Vanc 2/24 >> Cefepime 2/24 >>  Dose adjustments this admission:   Microbiology results:   Thank you for allowing pharmacy to be a part of this patient's care.  [employer], Pharm.D., BCPS Clinical Pharmacist  **Pharmacist phone directory can now be found on [email or web address] ([email or web address]).  Listed under [place].  06/16/2018 6:43 PM

## 2018-06-16 NOTE — Progress Notes (Addendum)
VASCULAR LAB PRELIMINARY  PRELIMINARY  PRELIMINARY  PRELIMINARY  Left lower extremity venous duplex completed.    Preliminary report:  See CV Proc  Called results to Dr. Maurie Boettcher, [facility], RVT 06/16/2018, 11:57 AM

## 2018-06-17 LAB — CBC
HEMATOCRIT: 49.2 % (ref 39.0–52.0)
Hemoglobin: 15.9 g/dL (ref 13.0–17.0)
MCH: 29.8 pg (ref 26.0–34.0)
MCHC: 32.3 g/dL (ref 30.0–36.0)
MCV: 92.1 fL (ref 80.0–100.0)
Platelets: 274 10*3/uL (ref 150–400)
RBC: 5.34 MIL/uL (ref 4.22–5.81)
RDW: 15.1 % (ref 11.5–15.5)
WBC: 9.6 10*3/uL (ref 4.0–10.5)
nRBC: 0 % (ref 0.0–0.2)

## 2018-06-17 LAB — GLUCOSE, CAPILLARY
Glucose-Capillary: 198 mg/dL — ABNORMAL HIGH (ref 70–99)
Glucose-Capillary: 174 mg/dL — ABNORMAL HIGH (ref 70–99)
Glucose-Capillary: 159 mg/dL — ABNORMAL HIGH (ref 70–99)
Glucose-Capillary: 214 mg/dL — ABNORMAL HIGH (ref 70–99)
Glucose-Capillary: 204 mg/dL — ABNORMAL HIGH (ref 70–99)

## 2018-06-17 LAB — HIV ANTIBODY (ROUTINE TESTING W REFLEX): HIV Screen 4th Generation wRfx: NONREACTIVE

## 2018-06-17 LAB — BASIC METABOLIC PANEL
Anion gap: 8 (ref 5–15)
BUN: 22 mg/dL — ABNORMAL HIGH (ref 6–20)
CO2: 23 mmol/L (ref 22–32)
Calcium: 9 mg/dL (ref 8.9–10.3)
Chloride: 107 mmol/L (ref 98–111)
Creatinine, Ser: 1.06 mg/dL (ref 0.61–1.24)
GFR calc Af Amer: >60 mL/min (ref >60)
GFR calc non Af Amer: >60 mL/min (ref >60)
Glucose, Bld: 201 mg/dL — ABNORMAL HIGH (ref 70–99)
Potassium: 4.4 mmol/L (ref 3.5–5.1)
Sodium: 138 mmol/L (ref 135–145)

## 2018-06-17 MED ORDER — ZOLPIDEM TARTRATE 5 MG PO TABS
5.0000 mg | ORAL_TABLET | Freq: Every evening | ORAL | Status: DC | PRN
  Administered 2018-06-17 – 2018-06-18 (×2): 5 mg via ORAL
  Filled 2018-06-17 (×2): qty 1

## 2018-06-17 MED ORDER — OXYCODONE HCL 5 MG PO TABS
10.0000 mg | ORAL_TABLET | Freq: Once | ORAL | Status: AC
  Administered 2018-06-17: 10 mg via ORAL
  Filled 2018-06-17: qty 2

## 2018-06-17 MED ORDER — VANCOMYCIN HCL IN DEXTROSE 1-5 GM/200ML-% IV SOLN
1000.0000 mg | Freq: Two times a day (BID) | INTRAVENOUS | Status: DC
  Administered 2018-06-17 – 2018-06-19 (×4): 1000 mg via INTRAVENOUS
  Filled 2018-06-17 (×5): qty 200

## 2018-06-17 NOTE — Progress Notes (Signed)
PROGRESS NOTE    SHONE LEVENTHAL  TJQ:[id number] DOB: Nov 19, 1960 DOA: 06/16/2018 PCP: Concepcion Elk, MD  Brief Narrative: 58 year old male with history of type 2 diabetes, chronic eczema, history of CAD and PAD with stents, ongoing tobacco abuse, hypertension, dyslipidemia has had recurrent eczematous dermatitis off and on for the past 10 years, has been followed by dermatology, biopsies etc. -2 to 3 weeks ago patient developed severe redness scaling inflammation and tenderness in his left lower leg with scaling, itching.  He was seen in the emergency room and treated with doxycycline and prednisone due to worsening redness and swelling came back to the emergency room yesterday and was admitted, started IV vancomycin  Assessment & Plan:   Cellulitis/secondary bacterial infection in the background of chronic eczematous dermatitis -We will hold off on steroids at this time due to ongoing infection and diabetes -Will use topical steroids, hydracerin -Continue IV vancomycin -Venous duplex was negative for DVT -Followed by local dermatologist, has an appointment next week, advised patient to keep this -Elevate left lower extremity  History of PAD -Followed by Dr. Trula Slade, underwent balloon angioplasty and stenting of right SFA in 11/2015, also history of right distal SFA, proximal popliteal artery occlusion and stenting of right CIA on 12/2014 -Time patient has claudication symptoms in his left lower leg as well, DP pulses are diminished but palpable -Due to follow-up with vascular in March for this -Continue aspirin, statin, encouraged smoking cessation  Type 2 diabetes mellitus -Metformin and Jardiance on hold -Followed by endocrinologist also just started Trulicity reportedly -Hemoglobin A1c is 10, wants to avoid insulin at this time  Hypertension -Stable, continue current meds  History of CAD -Stable, continue aspirin statin and beta-blocker  Tobacco abuse -Smoking cessation  counseled, does not want a nicotine patch  DVT prophylaxis: Lovenox Code Status: Full code Family Communication: No family at bedside Disposition Plan: Home likely tomorrow pending clinical improvement  Consultants:    Procedures:   Antimicrobials:    Subjective: -Reports improvement in his left leg swelling and pain -Fevers or chills, no nausea vomiting  Objective: Vitals:   06/16/18 1747 06/16/18 1949 06/17/18 0423 06/17/18 0835  BP: (!) 158/84 119/69 140/79 (!) 172/99  Pulse: 67 (!) 52 (!) 51 62  Resp: 16 20 18 16   Temp: 98 F (36.7 C) 98.4 F (36.9 C) 97.8 F (36.6 C) 98.3 F (36.8 C)  TempSrc: Oral Oral Oral Oral  SpO2: 97% 98% 98% 99%    Intake/Output Summary (Last 24 hours) at 06/17/2018 1143 Last data filed at 06/17/2018 0900 Gross per 24 hour  Intake 786.95 ml  Output 2 ml  Net 784.95 ml   There were no vitals filed for this visit.  Examination:  General exam: Appears calm and comfortable  Respiratory system: Clear to auscultation. Respiratory effort normal. Cardiovascular system: S1 & S2 heard, RRR. No JVD, murmurs, rubs, gallops Gastrointestinal system: Abdomen is nondistended, soft and nontender.Normal bowel sounds heard. Central nervous system: Alert and oriented. No focal neurological deficits. Extremities: Left lower leg with erythema edema redness mild tenderness and diffuse scaly eczematous lesions, streaks, dorsalis pedis pulses weak but palpable Skin: Diffuse eczema changes in both lower legs, arms face etc. Psychiatry: Judgement and insight appear normal. Mood & affect appropriate.     Data Reviewed:   CBC: Recent Labs  Lab 06/12/18 1032 06/16/18 1106 06/17/18 0321  WBC 11.0* 9.5 9.6  NEUTROABS 7.7 6.9  --   HGB 16.4 15.7 15.9  HCT 49.7 48.9 49.2  MCV 91.4 91.2 92.1  PLT 262 286 253   Basic Metabolic Panel: Recent Labs  Lab 06/12/18 1032 06/16/18 1106 06/17/18 0321  NA 138 135 138  K 4.1 4.9 4.4  CL 103 105 107  CO2 20*  20* 23  GLUCOSE 229* 262* 201*  BUN 20 23* 22*  CREATININE 0.98 0.95 1.06  CALCIUM 9.0 9.3 9.0   GFR: CrCl cannot be calculated (Unknown ideal weight.). Liver Function Tests: Recent Labs  Lab 06/12/18 1032 06/16/18 1106  AST 17 29  ALT 21 23  ALKPHOS 65 59  BILITOT 0.8 1.3*  PROT 6.6 6.1*  ALBUMIN 3.7 3.7   No results for input(s): LIPASE, AMYLASE in the last 168 hours. No results for input(s): AMMONIA in the last 168 hours. Coagulation Profile: No results for input(s): INR, PROTIME in the last 168 hours. Cardiac Enzymes: No results for input(s): CKTOTAL, CKMB, CKMBINDEX, TROPONINI in the last 168 hours. BNP (last 3 results) No results for input(s): PROBNP in the last 8760 hours. HbA1C: Recent Labs    06/16/18 1853  HGBA1C 10.2*   CBG: Recent Labs  Lab 06/16/18 1802 06/16/18 2140 06/17/18 0610 06/17/18 0819  GLUCAP 254* 254* 198* 174*   Lipid Profile: No results for input(s): CHOL, HDL, LDLCALC, TRIG, CHOLHDL, LDLDIRECT in the last 72 hours. Thyroid Function Tests: No results for input(s): TSH, T4TOTAL, FREET4, T3FREE, THYROIDAB in the last 72 hours. Anemia Panel: No results for input(s): VITAMINB12, FOLATE, FERRITIN, TIBC, IRON, RETICCTPCT in the last 72 hours. Urine analysis:    Component Value Date/Time   COLORURINE YELLOW 01/30/2011 1501   APPEARANCEUR CLEAR 01/30/2011 1501   LABSPEC 1.022 01/30/2011 1501   PHURINE 5.0 01/30/2011 1501   GLUCOSEU NEGATIVE 01/30/2011 1501   HGBUR NEGATIVE 01/30/2011 1501   BILIRUBINUR NEGATIVE 01/30/2011 1501   KETONESUR NEGATIVE 01/30/2011 1501   PROTEINUR NEGATIVE 01/30/2011 1501   UROBILINOGEN 0.2 01/30/2011 1501   NITRITE NEGATIVE 01/30/2011 1501   LEUKOCYTESUR NEGATIVE 01/30/2011 1501   Sepsis Labs: @LABRCNTIP (procalcitonin:4,lacticidven:4)  )No results found for this or any previous visit (from the past 240 hour(s)).       Radiology Studies: Vas [place] Lower Extremity Venous (dvt) (mc And Wl  7a-7p)  Result Date: 06/16/2018  Lower Venous Study Indications: Edema, and cellulitis.  Comparison Study: Prior study from 10/03/17 is available for comparison Performing Technologist: Sharion Dove RVS  Examination Guidelines: A complete evaluation includes B-mode imaging, spectral Doppler, color Doppler, and power Doppler as needed of all accessible portions of each vessel. Bilateral testing is considered an integral part of a complete examination. Limited examinations for reoccurring indications may be performed as noted.  Right Venous Findings: +---+---------------+---------+-----------+----------+-------+    CompressibilityPhasicitySpontaneityPropertiesSummary +---+---------------+---------+-----------+----------+-------+ CFVFull           Yes      Yes                          +---+---------------+---------+-----------+----------+-------+  Left Venous Findings: +---------+---------------+---------+-----------+----------+-------+          CompressibilityPhasicitySpontaneityPropertiesSummary +---------+---------------+---------+-----------+----------+-------+ CFV      Full           Yes      Yes                          +---------+---------------+---------+-----------+----------+-------+ SFJ      Full                                                 +---------+---------------+---------+-----------+----------+-------+  FV Prox  Full                                                 +---------+---------------+---------+-----------+----------+-------+ FV Mid   Full                                                 +---------+---------------+---------+-----------+----------+-------+ FV DistalFull                                                 +---------+---------------+---------+-----------+----------+-------+ PFV      Full                                                 +---------+---------------+---------+-----------+----------+-------+ POP      Full            Yes      Yes                          +---------+---------------+---------+-----------+----------+-------+ PTV      Full                                                 +---------+---------------+---------+-----------+----------+-------+ PERO     Full                                                 +---------+---------------+---------+-----------+----------+-------+ GSV      Full                                                 +---------+---------------+---------+-----------+----------+-------+    Summary: Right: No evidence of common femoral vein obstruction. Left: There is no evidence of deep vein thrombosis in the lower extremity.There is no evidence of superficial venous thrombosis. Ultrasound characteristics of enlarged lymph nodes noted in the groin.  *See table(s) above for measurements and observations. Electronically signed by Ruta Hinds MD on 06/16/2018 at 3:32:42 PM.    Final         Scheduled Meds: . aspirin EC  81 mg Oral Daily  . carvedilol  25 mg Oral BID WC  . enoxaparin (LOVENOX) injection  40 mg Subcutaneous Q24H  . eucerin   Topical Daily  . insulin aspart  0-5 Units Subcutaneous QHS  . insulin aspart  0-9 Units Subcutaneous TID WC  . rosuvastatin  10 mg Oral Daily   Continuous Infusions: . ceFEPime (MAXIPIME) IV 1 g (06/17/18 0429)  . vancomycin 1,250 mg (06/17/18 0613)     LOS: 1 day    Time spent: 33min  Domenic Polite, MD [facility] 06/17/2018, 11:43 AM

## 2018-06-17 NOTE — Progress Notes (Addendum)
Inpatient Diabetes Program Recommendations  AACE/ADA: New Consensus Statement on Inpatient Glycemic Control (2015)  Target Ranges:  Prepandial:   less than 140 mg/dL      Peak postprandial:   less than 180 mg/dL (1-2 hours)      Critically ill patients:  140 - 180 mg/dL   Lab Results  Component Value Date   GLUCAP 214 (H) 06/17/2018   HGBA1C 10.2 (H) 06/16/2018    Spoke with patient about his Hgb A1c of 10.2% (average of 246 mg/dl). He states it was as high as 14% (seen 05/01/2018 in office note) as he had "a lot of sugar over the holidays". Explained what an A1c is and what it measures. Reminded patient that  goal A1c is 7% or less per ADA standards to prevent both acute and long-term complications. Explained to patient the extreme importance of good glucose control at home. Encouraged patient to check CBGs at least bid at home (fasting and another check within the day) and to record all CBGs in a logbook for Endocrinologist to review.   He has a working glucometer and checks his blood sugar at home.  Patient has seen Dr. Meredith Pel (endocrinologist in [facility]) for the past couple of years. He was taking Glucophage 1000mg  BID and Jardiance 25 mg daily. Stated his endo had just given him samples of Truliciity (wasn't sure of the dose) and has done two weeks of that weekly injection recently but he has not received a prescription. He has a follow up appt with Dr. Meredith Pel next Friday 3/6. Patient states he will be going to that appointment and will take his CBG log.   Discussed diabetes management with patient. States he has been diabetic since 2004. Ordered Living Well with Diabetes book for patient.   -- Will follow during hospitalization.--  Jonna Clark RN, MSN Diabetes Coordinator Inpatient Glycemic Control Team Team Pager: [phone] (8am-5pm)

## 2018-06-17 NOTE — Progress Notes (Signed)
Pharmacy Antibiotic Note  [name] is a 58 y.o. male admitted on 06/16/2018 with cellulitis.  Pt was seen in ED 2/22 and started on PO Doxy without improvement.  Pharmacy has been consulted for Vancomycin dosing.  Plan: Stop cefepime per MD Change vancomycin to 1g IV Q12h Monitor clinical picture, renal function, vanc levels prn F/U C&S, abx deescalation / LOT  Height: 5\' 7"  (170.2 cm) Weight: 187 lb 6.3 oz (85 kg) IBW/kg (Calculated) : 66.1  Temp (24hrs), Avg:98.1 F (36.7 C), Min:97.8 F (36.6 C), Max:98.4 F (36.9 C)  Recent Labs  Lab 06/12/18 1032 06/16/18 1106 06/17/18 0321  WBC 11.0* 9.5 9.6  CREATININE 0.98 0.95 1.06    Estimated Creatinine Clearance: 81.1 mL/min (by C-G formula based on SCr of 1.06 mg/dL).    Allergies  Allergen Reactions  . Codeine Shortness Of Breath and Itching  . Gabapentin Other (See Comments)    Suicidal thoughts, extreme dreams  . Pregabalin     Suicidal thoughts,extreme dreams   . Simvastatin     Pain in joints  . Hydrocodone Nausea Only and Rash    Breathing problems   . Lisinopril Itching and Rash  . Venlafaxine Rash    Elenor Quinones, PharmD, BCPS, [facility] Clinical Pharmacist Phone number [phone] 06/17/2018 12:49 PM

## 2018-06-17 NOTE — Plan of Care (Signed)

## 2018-06-17 NOTE — Progress Notes (Signed)
Patient is very upset and irate.  He stated he has been in pain all day and all he is getting is Tylenol and Benadryl.  He is threatening to leave AMA.  He stated "I can take this shit at home, I don't have to be here for Tylenol"! He stated his pain is a 10/10.  I reminded patient he need to receive his iv antibiotics that is why he is here. I am sending message to on-call MD for pain medication and a sleep aid as per patient's request.

## 2018-06-17 NOTE — Progress Notes (Signed)
Physical Therapy Evaluation Patient Details Name: [name] MRN: [id number] DOB: May 20, 1960 Today's Date: 06/17/2018   History of Present Illness  Patient is 58 y/o male admitted to hospital with LLE cellultis secondary to chronic eczematous dermatitis. PMH includes CAD, tobacco abuse, HTN, HLD, DM, and PAD.   Clinical Impression  Patient admitted to hospital secondary to problems above and with deficits below. Patient at modI to independent level for all functional mobility. No further acute PT needs identified. All education has been completed and the patient has no further questions. See below for any follow-up Physical Therapy or equipment needs. PT is signing off. Thank you for this referral. If needs change, please re-consult.      Follow Up Recommendations No PT follow up    Equipment Recommendations  None recommended by PT    Recommendations for Other Services       Precautions / Restrictions Precautions Precautions: None Restrictions Weight Bearing Restrictions: No      Mobility  Bed Mobility Overal bed mobility: Independent             General bed mobility comments: Patient is independent for all bed mobility  Transfers Overall transfer level: Independent Equipment used: None             General transfer comment: Patient independent for all transfers. Patient with inital pain in LLE upon standing that subsides with time and mobility.   Ambulation/Gait Ambulation/Gait assistance: Modified independent (Device/Increase time) Gait Distance (Feet): 400 Feet Assistive device: None Gait Pattern/deviations: Step-through pattern;Antalgic Gait velocity: decreased Gait velocity interpretation: 1.31 - 2.62 ft/sec, indicative of limited community ambulator General Gait Details: Patient ambulated at modI level with decreased gait speed. Patient with antalgic gait pattern with first couple steps but pain subsides with mobility. States he is at his baseline  for mobility   Stairs Stairs: Yes Stairs assistance: Modified independent (Device/Increase time) Stair Management: One rail Right Number of Stairs: 4 General stair comments: Patient at [place] level for stair navigation. Education provided about stair sequencing technique if experiencing pain in LLE.   Wheelchair Mobility    Modified Rankin (Stroke Patients Only)       Balance Overall balance assessment: No apparent balance deficits (not formally assessed)                                           Pertinent Vitals/Pain Pain Assessment: 0-10 Pain Score: 10-Worst pain ever Pain Location: LLE initally when standing Pain Descriptors / Indicators: Pressure Pain Intervention(s): Limited activity within patient's tolerance;Monitored during session    Home Living Family/patient expects to be discharged to:: Private residence Living Arrangements: Parent Available Help at Discharge: Family;Available 24 hours/day Type of Home: House Home Access: Stairs to enter Entrance Stairs-Rails: Right Entrance Stairs-Number of Steps: 3 Home Layout: One level Home Equipment: Cane - quad;Shower seat      Prior Function Level of Independence: Independent               Hand Dominance        Extremity/Trunk Assessment   Upper Extremity Assessment Upper Extremity Assessment: Overall WFL for tasks assessed    Lower Extremity Assessment Lower Extremity Assessment: LLE deficits/detail LLE Deficits / Details: LLE pain due to cellulitis     Cervical / Trunk Assessment Cervical / Trunk Assessment: Normal  Communication   Communication: No difficulties  Cognition Arousal/Alertness: Awake/alert Behavior  During Therapy: WFL for tasks assessed/performed Overall Cognitive Status: Within Functional Limits for tasks assessed                                        General Comments General comments (skin integrity, edema, etc.): Patient reports feeling  comfortable with everything he needs to do at home.    Exercises     Assessment/Plan    PT Assessment Patent does not need any further PT services  PT Problem List         PT Treatment Interventions      PT Goals (Current goals can be found in the Care Plan section)  Acute Rehab PT Goals Patient Stated Goal: go home PT Goal Formulation: With patient Time For Goal Achievement: 06/17/18 Potential to Achieve Goals: Good    Frequency     Barriers to discharge        Co-evaluation               AM-PAC PT "6 Clicks" Mobility  Outcome Measure Help needed turning from your back to your side while in a flat bed without using bedrails?: None Help needed moving from lying on your back to sitting on the side of a flat bed without using bedrails?: None Help needed moving to and from a bed to a chair (including a wheelchair)?: None Help needed standing up from a chair using your arms (e.g., wheelchair or bedside chair)?: None Help needed to walk in hospital room?: None Help needed climbing 3-5 steps with a railing? : None 6 Click Score: 24    End of Session Equipment Utilized During Treatment: Gait belt Activity Tolerance: Patient tolerated treatment well Patient left: in chair;with call bell/phone within reach Nurse Communication: Mobility status PT Visit Diagnosis: Pain;Other abnormalities of gait and mobility (R26.89) Pain - Right/Left: Left Pain - part of body: Leg    Time: 1430-1443 PT Time Calculation (min) (ACUTE ONLY): 13 min   Charges:   PT Evaluation $PT Eval Low Complexity: 1 Low          Erick Blinks, SPT  Erick Blinks 06/17/2018, 3:57 PM

## 2018-06-18 LAB — CBC
HCT: 46.3 % (ref 39.0–52.0)
Hemoglobin: 15.1 g/dL (ref 13.0–17.0)
MCH: 29.7 pg (ref 26.0–34.0)
MCHC: 32.6 g/dL (ref 30.0–36.0)
MCV: 91.1 fL (ref 80.0–100.0)
Platelets: 253 10*3/uL (ref 150–400)
RBC: 5.08 MIL/uL (ref 4.22–5.81)
RDW: 15 % (ref 11.5–15.5)
WBC: 9.6 10*3/uL (ref 4.0–10.5)
nRBC: 0 % (ref 0.0–0.2)

## 2018-06-18 LAB — BASIC METABOLIC PANEL
Anion gap: 8 (ref 5–15)
BUN: 20 mg/dL (ref 6–20)
CO2: 20 mmol/L — ABNORMAL LOW (ref 22–32)
Calcium: 8.6 mg/dL — ABNORMAL LOW (ref 8.9–10.3)
Chloride: 110 mmol/L (ref 98–111)
Creatinine, Ser: 0.86 mg/dL (ref 0.61–1.24)
GFR calc Af Amer: >60 mL/min (ref >60)
Glucose, Bld: 167 mg/dL — ABNORMAL HIGH (ref 70–99)
Potassium: 3.8 mmol/L (ref 3.5–5.1)
Sodium: 138 mmol/L (ref 135–145)

## 2018-06-18 LAB — GLUCOSE, CAPILLARY
Glucose-Capillary: 188 mg/dL — ABNORMAL HIGH (ref 70–99)
Glucose-Capillary: 189 mg/dL — ABNORMAL HIGH (ref 70–99)
Glucose-Capillary: 208 mg/dL — ABNORMAL HIGH (ref 70–99)
Glucose-Capillary: 246 mg/dL — ABNORMAL HIGH (ref 70–99)

## 2018-06-18 MED ORDER — OXYCODONE HCL 5 MG PO TABS
10.0000 mg | ORAL_TABLET | Freq: Four times a day (QID) | ORAL | Status: DC | PRN
  Administered 2018-06-18 – 2018-06-20 (×9): 10 mg via ORAL
  Filled 2018-06-18 (×9): qty 2

## 2018-06-18 MED ORDER — HYDROXYZINE HCL 25 MG PO TABS
25.0000 mg | ORAL_TABLET | Freq: Once | ORAL | Status: AC
  Administered 2018-06-18: 25 mg via ORAL
  Filled 2018-06-18: qty 1

## 2018-06-18 NOTE — Plan of Care (Signed)
  Problem: Clinical Measurements: Goal: Ability to maintain clinical measurements within normal limits will improve Outcome: Progressing   Problem: Activity: Goal: Risk for activity intolerance will decrease Outcome: Progressing   Problem: Nutrition: Goal: Adequate nutrition will be maintained Outcome: Progressing   Problem: Coping: Goal: Level of anxiety will decrease Outcome: Progressing   Problem: Elimination: Goal: Will not experience complications related to bowel motility Outcome: Progressing   Problem: Safety: Goal: Ability to remain free from injury will improve Outcome: Progressing   Problem: Skin Integrity: Goal: Risk for impaired skin integrity will decrease Outcome: Progressing   

## 2018-06-18 NOTE — Progress Notes (Signed)
Patient received a one time dose of Oxy IR 10 mg and Ambien 5 mg PO.  He has been resting comfortably.

## 2018-06-18 NOTE — Progress Notes (Signed)
Orthopedic Tech Progress Note Patient Details:  [name] 1960-10-28 [id number]  Ortho Devices Ortho Device/Splint Location: left PRAFO  Ortho Device/Splint Interventions: Application   Post Interventions Patient Tolerated: Well Instructions Provided: Care of device   Norton Pastel 06/18/2018, 12:11 PM

## 2018-06-18 NOTE — Progress Notes (Signed)
PROGRESS NOTE    [name]  QBV:[id number] DOB: April 30, 1960 DOA: 06/16/2018 PCP: Concepcion Elk, MD  Brief Narrative: 58 year old male with history of type 2 diabetes, chronic eczema, history of CAD and PAD with stents, ongoing tobacco abuse, hypertension, dyslipidemia has had recurrent eczematous dermatitis off and on for the past 10 years, has been followed by dermatology, biopsies etc. -2 to 3 weeks ago patient developed severe redness scaling inflammation and tenderness in his left lower leg with scaling, itching.  He was seen in the emergency room and treated with doxycycline and prednisone due to worsening redness and swelling came back to the emergency room 2/24 and was admitted, started IV vancomycin  Assessment & Plan:   Cellulitis/secondary bacterial infection in the background of chronic eczematous dermatitis -Discontinued steroids due to ongoing infection, diabetes  -Clinically improving, continue IV vancomycin, topical steroids emollients  -Venous duplex was negative for DVT  -Elevate left lower extremity, consult [facility] for Boot -Followed by local dermatologist, has an appointment next week, advised patient to keep this -Anticipate discharge home tomorrow  History of PAD -Followed by Dr. Trula Slade, underwent balloon angioplasty and stenting of right SFA in 11/2015, also history of right distal SFA, proximal popliteal artery occlusion and stenting of right CIA on 12/2014 -Reports symptoms suggestive of claudication in his left leg for the last 6 months, DP pulses are diminished but palpable  -Due to follow-up with vascular surgery in March for this  -Continue aspirin, statin, smoking cessation encouraged  Type 2 diabetes mellitus -Metformin and Jardiance on hold -Followed by endocrinologist also just started Trulicity reportedly -Hemoglobin A1c is 10, wants to avoid insulin at this time  Hypertension -Stable, continue current meds  History of CAD -Stable, continue  aspirin statin and beta-blocker  Tobacco abuse -Smoking cessation counseled, does not want a nicotine patch  DVT prophylaxis: Lovenox Code Status: Full code Family Communication: No family at bedside Disposition Plan: Home likely tomorrow pending clinical improvement  Consultants:    Procedures:   Antimicrobials:    Subjective: -Had severe pain when he tried to stand up this morning, swelling and redness is improving  Objective: Vitals:   06/17/18 1412 06/17/18 1740 06/17/18 2000 06/18/18 0526  BP: (!) 134/58 (!) 142/73 129/65 (!) 144/66  Pulse: (!) 54 (!) 54 (!) 53 (!) 51  Resp: 16 14 15 16   Temp: 98.2 F (36.8 C) 100 F (37.8 C) 98.4 F (36.9 C) 97.8 F (36.6 C)  TempSrc:   Oral Oral  SpO2: 95% 95% 99% 99%  Weight:      Height:        Intake/Output Summary (Last 24 hours) at 06/18/2018 1137 Last data filed at 06/17/2018 2300 Gross per 24 hour  Intake 880 ml  Output 3 ml  Net 877 ml   Filed Weights   06/17/18 1200  Weight: 85 kg    Examination:  Gen: Awake, Alert, Oriented X 3, no distress HEENT: PERRLA, Neck supple, no JVD Lungs: Good air movement bilaterally, CTAB CVS: RRR,No Gallops,Rubs or new Murmurs Abd: soft, Non tender, non distended, BS present Extremities: Left lower leg with erythema edema mild tenderness and diffuse scaly eczematous lesions with scaly streaks, dorsalis pedis pulse is weak but palpable Skin: Diffuse eczema type rash in arms and legs Psychiatry: Judgement and insight appear normal. Mood & affect appropriate.     Data Reviewed:   CBC: Recent Labs  Lab 06/12/18 1032 06/16/18 1106 06/17/18 0321 06/18/18 0250  WBC 11.0* 9.5 9.6 9.6  NEUTROABS 7.7 6.9  --   --   HGB 16.4 15.7 15.9 15.1  HCT 49.7 48.9 49.2 46.3  MCV 91.4 91.2 92.1 91.1  PLT 262 286 274 947   Basic Metabolic Panel: Recent Labs  Lab 06/12/18 1032 06/16/18 1106 06/17/18 0321 06/18/18 0250  NA 138 135 138 138  K 4.1 4.9 4.4 3.8  CL 103 105 107 110   CO2 20* 20* 23 20*  GLUCOSE 229* 262* 201* 167*  BUN 20 23* 22* 20  CREATININE 0.98 0.95 1.06 0.86  CALCIUM 9.0 9.3 9.0 8.6*   GFR: Estimated Creatinine Clearance: 100 mL/min (by C-G formula based on SCr of 0.86 mg/dL). Liver Function Tests: Recent Labs  Lab 06/12/18 1032 06/16/18 1106  AST 17 29  ALT 21 23  ALKPHOS 65 59  BILITOT 0.8 1.3*  PROT 6.6 6.1*  ALBUMIN 3.7 3.7   No results for input(s): LIPASE, AMYLASE in the last 168 hours. No results for input(s): AMMONIA in the last 168 hours. Coagulation Profile: No results for input(s): INR, PROTIME in the last 168 hours. Cardiac Enzymes: No results for input(s): CKTOTAL, CKMB, CKMBINDEX, TROPONINI in the last 168 hours. BNP (last 3 results) No results for input(s): PROBNP in the last 8760 hours. HbA1C: Recent Labs    06/16/18 1853  HGBA1C 10.2*   CBG: Recent Labs  Lab 06/17/18 1142 06/17/18 1605 06/17/18 2119 06/18/18 0726 06/18/18 1116  GLUCAP 159* 214* 204* 188* 208*   Lipid Profile: No results for input(s): CHOL, HDL, LDLCALC, TRIG, CHOLHDL, LDLDIRECT in the last 72 hours. Thyroid Function Tests: No results for input(s): TSH, T4TOTAL, FREET4, T3FREE, THYROIDAB in the last 72 hours. Anemia Panel: No results for input(s): VITAMINB12, FOLATE, FERRITIN, TIBC, IRON, RETICCTPCT in the last 72 hours. Urine analysis:    Component Value Date/Time   COLORURINE YELLOW 01/30/2011 1501   APPEARANCEUR CLEAR 01/30/2011 1501   LABSPEC 1.022 01/30/2011 1501   PHURINE 5.0 01/30/2011 1501   GLUCOSEU NEGATIVE 01/30/2011 1501   HGBUR NEGATIVE 01/30/2011 1501   BILIRUBINUR NEGATIVE 01/30/2011 1501   KETONESUR NEGATIVE 01/30/2011 1501   PROTEINUR NEGATIVE 01/30/2011 1501   UROBILINOGEN 0.2 01/30/2011 1501   NITRITE NEGATIVE 01/30/2011 1501   LEUKOCYTESUR NEGATIVE 01/30/2011 1501   Sepsis Labs: @LABRCNTIP (procalcitonin:4,lacticidven:4)  )No results found for this or any previous visit (from the past 240 hour(s)).        Radiology Studies: Vas [place] Lower Extremity Venous (dvt) (mc And Wl 7a-7p)  Result Date: 06/16/2018  Lower Venous Study Indications: Edema, and cellulitis.  Comparison Study: Prior study from 10/03/17 is available for comparison Performing Technologist: Sharion Dove RVS  Examination Guidelines: A complete evaluation includes B-mode imaging, spectral Doppler, color Doppler, and power Doppler as needed of all accessible portions of each vessel. Bilateral testing is considered an integral part of a complete examination. Limited examinations for reoccurring indications may be performed as noted.  Right Venous Findings: +---+---------------+---------+-----------+----------+-------+    CompressibilityPhasicitySpontaneityPropertiesSummary +---+---------------+---------+-----------+----------+-------+ CFVFull           Yes      Yes                          +---+---------------+---------+-----------+----------+-------+  Left Venous Findings: +---------+---------------+---------+-----------+----------+-------+          CompressibilityPhasicitySpontaneityPropertiesSummary +---------+---------------+---------+-----------+----------+-------+ CFV      Full           Yes      Yes                          +---------+---------------+---------+-----------+----------+-------+  SFJ      Full                                                 +---------+---------------+---------+-----------+----------+-------+ FV Prox  Full                                                 +---------+---------------+---------+-----------+----------+-------+ FV Mid   Full                                                 +---------+---------------+---------+-----------+----------+-------+ FV DistalFull                                                 +---------+---------------+---------+-----------+----------+-------+ PFV      Full                                                  +---------+---------------+---------+-----------+----------+-------+ POP      Full           Yes      Yes                          +---------+---------------+---------+-----------+----------+-------+ PTV      Full                                                 +---------+---------------+---------+-----------+----------+-------+ PERO     Full                                                 +---------+---------------+---------+-----------+----------+-------+ GSV      Full                                                 +---------+---------------+---------+-----------+----------+-------+    Summary: Right: No evidence of common femoral vein obstruction. Left: There is no evidence of deep vein thrombosis in the lower extremity.There is no evidence of superficial venous thrombosis. Ultrasound characteristics of enlarged lymph nodes noted in the groin.  *See table(s) above for measurements and observations. Electronically signed by Ruta Hinds MD on 06/16/2018 at 3:32:42 PM.    Final         Scheduled Meds: . aspirin EC  81 mg Oral Daily  . carvedilol  25 mg Oral BID WC  . enoxaparin (LOVENOX) injection  40 mg Subcutaneous Q24H  . eucerin   Topical Daily  . insulin  aspart  0-5 Units Subcutaneous QHS  . insulin aspart  0-9 Units Subcutaneous TID WC  . rosuvastatin  10 mg Oral Daily   Continuous Infusions: . vancomycin 1,000 mg (06/18/18 0514)     LOS: 2 days    Time spent: 73min    Domenic Polite, MD [facility] 06/18/2018, 11:37 AM

## 2018-06-19 ENCOUNTER — Inpatient Hospital Stay (HOSPITAL_COMMUNITY): Payer: Medicaid Other

## 2018-06-19 DIAGNOSIS — R52 Pain, unspecified: Secondary | ICD-10-CM

## 2018-06-19 DIAGNOSIS — I739 Peripheral vascular disease, unspecified: Secondary | ICD-10-CM

## 2018-06-19 LAB — GLUCOSE, CAPILLARY
Glucose-Capillary: 200 mg/dL — ABNORMAL HIGH (ref 70–99)
Glucose-Capillary: 219 mg/dL — ABNORMAL HIGH (ref 70–99)
Glucose-Capillary: 137 mg/dL — ABNORMAL HIGH (ref 70–99)
Glucose-Capillary: 251 mg/dL — ABNORMAL HIGH (ref 70–99)

## 2018-06-19 MED ORDER — HYDROXYZINE HCL 25 MG PO TABS
25.0000 mg | ORAL_TABLET | Freq: Four times a day (QID) | ORAL | Status: DC | PRN
  Administered 2018-06-19 – 2018-06-20 (×5): 25 mg via ORAL
  Filled 2018-06-19 (×5): qty 1

## 2018-06-19 MED ORDER — EUCERIN EX CREA: TOPICAL_CREAM | Freq: Every day | CUTANEOUS | 0 refills | Status: DC

## 2018-06-19 MED ORDER — DOXYCYCLINE HYCLATE 100 MG PO CAPS: 100.0000 mg | ORAL_CAPSULE | Freq: Two times a day (BID) | ORAL | 0 refills | Status: AC

## 2018-06-19 MED ORDER — VANCOMYCIN HCL 10 G IV SOLR
1250.0000 mg | Freq: Two times a day (BID) | INTRAVENOUS | Status: DC
  Administered 2018-06-19 – 2018-06-20 (×2): 1250 mg via INTRAVENOUS
  Filled 2018-06-19 (×4): qty 1250

## 2018-06-19 NOTE — Progress Notes (Signed)
ABI has been completed.  Refer to "CV Proc" under chart review to view preliminary results.  06/19/2018 2:44 PM Maudry Mayhew, MHA, RVT, RDCS, RDMS

## 2018-06-19 NOTE — Plan of Care (Signed)

## 2018-06-19 NOTE — Progress Notes (Addendum)
PROGRESS NOTE  [name] JXB:[id number] DOB: 02-Dec-1960 DOA: 06/16/2018 PCP: Concepcion Elk, MD  HPI/Recap of past 64 hours:  58 year old male with history of type 2 diabetes, chronic eczema, history of CAD and PAD with stents, ongoing tobacco abuse, hypertension, dyslipidemia has had recurrent eczematous dermatitis off and on for the past 10 years, has been followed by dermatology, biopsies etc. -2 to 3 weeks ago 58 year old male with history redness scaling inflammation and tenderness in his left lower leg with scaling, itching.  He was seen in the emergency room and treated with doxycycline and prednisone due to worsening redness and swelling came back to the emergency room 2/24 and was admitted, started IV vancomycin.  06/19/2018: Patient seen and examined at his bedside.  Reports significant pain in his left lower extremity, suspect claudication pain.  Unable to walk or bear weight on that leg.  PT ordered to assess.  Follows with Dr. Trula Slade outpatient.  Assessment/Plan: Principal Problem:   Superficial bacterial skin infection Active Problems:   CAD (coronary artery disease)   Tobacco abuse   Hypertension   Hyperlipidemia   Diabetes mellitus ([place])   Bacterial skin infection  Cellulitis/secondary bacterial infection in the background of chronic eczematous dermatitis -Discontinued steroids due to ongoing infection, diabetes  -Clinically improving, continue IV vancomycin, topical steroids emollients  -Venous duplex was negative for DVT  -Elevate left lower extremity, consult [facility] for Boot -Followed by local dermatologist, has an appointment next week, advised patient to keep this  Severe left lower extremity pain Possibly from peripheral artery disease Unable to bear weight on the left leg due to significant pain PT order to assess Obtain ABI Follows with Dr. Trula Slade outpatient Dorsalis pedis pulse palpable but faint  History of PAD -Followed by Dr. Trula Slade, underwent  balloon angioplasty and stenting of right SFA in 11/2015, also history of right distal SFA, proximal popliteal artery occlusion and stenting of right CIA on 12/2014 -Reports symptoms suggestive of claudication in his left leg for the last 6 months, DP pulses are diminished but palpable  -Due to follow-up with vascular surgery in March, 2020 -Continue aspirin, statin, smoking cessation encouraged  Type 2 diabetes mellitus -Metformin and Jardiance on hold -Followed by endocrinologist also just started Trulicity reportedly -Hemoglobin A1c is 10, wants to avoid insulin at this time  Hypertension -Stable, continue current meds  History of CAD -Stable, continue aspirin statin and beta-blocker  Tobacco abuse -Smoking cessation counseled, does not want a nicotine patch  DVT prophylaxis: Lovenox Code Status: Full code Family Communication: No family at bedside Disposition Plan: Home likely tomorrow pending clinical improvement  Consultants: None   Procedures:   Antimicrobials:     Objective: Vitals:   06/18/18 1403 06/18/18 2100 06/19/18 0611 06/19/18 0810  BP: (!) 151/72 118/64 (!) 154/94 (!) 159/86  Pulse: (!) 51 63  (!) 51  Resp:  18 18   Temp: 98.6 F (37 C) 98.6 F (37 C) (!) 96.1 F (35.6 C) 97.7 F (36.5 C)  TempSrc: Oral Oral Oral Oral  SpO2:  95% 96%   Weight:      Height:        Intake/Output Summary (Last 24 hours) at 06/19/2018 1041 Last data filed at 06/19/2018 1308 Gross per 24 hour  Intake 720 ml  Output -  Net 720 ml   Filed Weights   06/17/18 1200  Weight: 85 kg    Exam:  . General: 58 y.o. year-old male well developed well nourished in no acute  distress.  Alert and oriented x3. . Cardiovascular: Regular rate and rhythm with no rubs or gallops.  No thyromegaly or JVD noted.   [name] Kitchen Respiratory: Clear to auscultation with no wheezes or rales. Good inspiratory effort. . Abdomen: Soft nontender nondistended with normal bowel sounds x4  quadrants. . Musculoskeletal: Trace lower extremity edema.  Left dorsalis pedis pulse palpable but faint. . Skin: Erythema and warmth in left lower extremity are improved. [name] Kitchen Psychiatry: Mood is appropriate for condition and setting   Data Reviewed: CBC: Recent Labs  Lab 06/16/18 1106 06/17/18 0321 06/18/18 0250  WBC 9.5 9.6 9.6  NEUTROABS 6.9  --   --   HGB 15.7 15.9 15.1  HCT 48.9 49.2 46.3  MCV 91.2 92.1 91.1  PLT 286 274 329   Basic Metabolic Panel: Recent Labs  Lab 06/16/18 1106 06/17/18 0321 06/18/18 0250  NA 135 138 138  K 4.9 4.4 3.8  CL 105 107 110  CO2 20* 23 20*  GLUCOSE 262* 201* 167*  BUN 23* 22* 20  CREATININE 0.95 1.06 0.86  CALCIUM 9.3 9.0 8.6*   GFR: Estimated Creatinine Clearance: 100 mL/min (by C-G formula based on SCr of 0.86 mg/dL). Liver Function Tests: Recent Labs  Lab 06/16/18 1106  AST 29  ALT 23  ALKPHOS 59  BILITOT 1.3*  PROT 6.1*  ALBUMIN 3.7   No results for input(s): LIPASE, AMYLASE in the last 168 hours. No results for input(s): AMMONIA in the last 168 hours. Coagulation Profile: No results for input(s): INR, PROTIME in the last 168 hours. Cardiac Enzymes: No results for input(s): CKTOTAL, CKMB, CKMBINDEX, TROPONINI in the last 168 hours. BNP (last 3 results) No results for input(s): PROBNP in the last 8760 hours. HbA1C: Recent Labs    06/16/18 1853  HGBA1C 10.2*   CBG: Recent Labs  Lab 06/18/18 0726 06/18/18 1116 06/18/18 1523 06/18/18 2332 06/19/18 0650  GLUCAP 188* 208* 189* 246* 200*   Lipid Profile: No results for input(s): CHOL, HDL, LDLCALC, TRIG, CHOLHDL, LDLDIRECT in the last 72 hours. Thyroid Function Tests: No results for input(s): TSH, T4TOTAL, FREET4, T3FREE, THYROIDAB in the last 72 hours. Anemia Panel: No results for input(s): VITAMINB12, FOLATE, FERRITIN, TIBC, IRON, RETICCTPCT in the last 72 hours. Urine analysis:    Component Value Date/Time   COLORURINE YELLOW 01/30/2011 1501    APPEARANCEUR CLEAR 01/30/2011 1501   LABSPEC 1.022 01/30/2011 1501   PHURINE 5.0 01/30/2011 1501   GLUCOSEU NEGATIVE 01/30/2011 1501   HGBUR NEGATIVE 01/30/2011 1501   BILIRUBINUR NEGATIVE 01/30/2011 1501   KETONESUR NEGATIVE 01/30/2011 1501   PROTEINUR NEGATIVE 01/30/2011 1501   UROBILINOGEN 0.2 01/30/2011 1501   NITRITE NEGATIVE 01/30/2011 1501   LEUKOCYTESUR NEGATIVE 01/30/2011 1501   Sepsis Labs: @LABRCNTIP (procalcitonin:4,lacticidven:4)  )No results found for this or any previous visit (from the past 240 hour(s)).    Studies: No results found.  Scheduled Meds: . aspirin EC  81 mg Oral Daily  . carvedilol  25 mg Oral BID WC  . enoxaparin (LOVENOX) injection  40 mg Subcutaneous Q24H  . eucerin   Topical Daily  . insulin aspart  0-5 Units Subcutaneous QHS  . insulin aspart  0-9 Units Subcutaneous TID WC  . rosuvastatin  10 mg Oral Daily    Continuous Infusions: . vancomycin 1,000 mg (06/19/18 0618)     LOS: 3 days     Kayleen Memos, MD [facility] Pager [phone]  If 7PM-7AM, please contact night-coverage [email or web address] Password [facility] 06/19/2018, 10:41 AM

## 2018-06-19 NOTE — Evaluation (Signed)
Physical Therapy Evaluation Patient Details Name: [name] MRN: [id number] DOB: Jan 06, 1961 Today's Date: 06/19/2018   History of Present Illness  Patient is 58 y/o male admitted to hospital with LLE cellultis secondary to chronic eczematous dermatitis. PMH includes CAD, tobacco abuse, HTN, HLD, DM, and PAD.   Clinical Impression  Pt has had a marked decrease in mobility since prior PT evaluation 06/17/18. Pt experiencing 10/10 pain with placement of L LE in any type of dependent position. Pt able to independently come to sitting on EoB but can not tolerate placing L LE down to floor for extended period of time. Pt requires minA to come into standing with RW and mod A for LoB after attempting to place weight through L LE, and then required modA for hopping 2 feet to recliner. PT hopeful that pain can be better managed prior to d/c home, if not pt will require a w/c to be able to maintain elevation of his L LE, and HHPT to work on his safe mobility. PT will continue to follow acutely.     Follow Up Recommendations Home health PT;Supervision - Intermittent    Equipment Recommendations  Rolling walker with 5" wheels;Wheelchair (measurements PT);Wheelchair cushion (measurements PT)       Precautions / Restrictions Precautions Precautions: Fall Precaution Comments: excessive pain with L LE in dependent position  Restrictions Weight Bearing Restrictions: No      Mobility  Bed Mobility Overal bed mobility: Independent             General bed mobility comments: Pt is independent in getting to EoB however can not tolerate L LE in dependent position and had to elevate in back onto bed surface until RW and IV lines placed   Transfers Overall transfer level: Needs assistance Equipment used: Rolling walker (2 wheeled) Transfers: Sit to/from Stand Sit to Stand: Min assist         General transfer comment: min A for steadying in standing with  RW  Ambulation/Gait Ambulation/Gait assistance: Mod assist Gait Distance (Feet): 2 Feet Assistive device: Rolling walker (2 wheeled) Gait Pattern/deviations: Decreased stance time - left;Decreased weight shift to left;Antalgic(hop to ) Gait velocity: decreased Gait velocity interpretation: <1.31 ft/sec, indicative of household ambulator General Gait Details: modA for LoB from L knee buckling secondary to pain as pt attempted to bearweight through L LE, once steadied with RW pt requires modA for hopping on R LE to chair, as soon as pt sat down he quickly propped his L LE on the bed to reduce pain  Stairs Stairs: (unable to attempt)                Balance   Sitting-balance support: Single extremity supported Sitting balance-[name] Scale: Poor Sitting balance - Comments: unable to sit with L LE in dependent postion requires UE support to be able to prop L LE back on bed   Standing balance support: Bilateral upper extremity supported Standing balance-[name] Scale: Poor Standing balance comment: requires UE support on RW to keep L LE elevated due to pain                              Pertinent Vitals/Pain Pain Assessment: 0-10 Pain Score: 10-Worst pain ever Pain Location: L LE in dependent position  Pain Descriptors / Indicators: Burning;Cramping;Sharp;Stabbing Pain Intervention(s): Limited activity within patient's tolerance;Monitored during session;Repositioned;Premedicated before session    Home Living Family/patient expects to be discharged to:: Private residence Living  Arrangements: Parent Available Help at Discharge: Family;Available 24 hours/day Type of Home: House Home Access: Stairs to enter Entrance Stairs-Rails: Right Entrance Stairs-Number of Steps: 3 Home Layout: One level Home Equipment: Cane - quad;Shower seat      Prior Function Level of Independence: Independent               Hand Dominance        Extremity/Trunk Assessment    Upper Extremity Assessment Upper Extremity Assessment: Overall WFL for tasks assessed    Lower Extremity Assessment Lower Extremity Assessment: LLE deficits/detail LLE Deficits / Details: cellulitis of LE, unable to tolerate in dependent position  LLE: Unable to fully assess due to pain LLE Sensation: (sensitive to touch)    Cervical / Trunk Assessment Cervical / Trunk Assessment: Normal  Communication   Communication: No difficulties  Cognition Arousal/Alertness: Awake/alert Behavior During Therapy: WFL for tasks assessed/performed Overall Cognitive Status: Within Functional Limits for tasks assessed                                        General Comments General comments (skin integrity, edema, etc.): Pt with cellulitis over L LE and rash over remainder of body. Pt in bed with only sheet over him due to increased discomfort/itching with gown, Pt does place gown on for transfer.         Assessment/Plan    PT Assessment Patient needs continued PT services  PT Problem List Decreased activity tolerance;Decreased balance;Decreased mobility;Pain       PT Treatment Interventions Therapeutic exercise;DME instruction;Gait training;Functional mobility training;Therapeutic activities;Balance training;Patient/family education    PT Goals (Current goals can be found in the Care Plan section)  Acute Rehab PT Goals Patient Stated Goal: go home PT Goal Formulation: With patient Time For Goal Achievement: 07/03/18 Potential to Achieve Goals: Good    Frequency Min 3X/week    AM-PAC PT "6 Clicks" Mobility  Outcome Measure Help needed turning from your back to your side while in a flat bed without using bedrails?: None Help needed moving from lying on your back to sitting on the side of a flat bed without using bedrails?: None Help needed moving to and from a bed to a chair (including a wheelchair)?: A Lot Help needed standing up from a chair using your arms (e.g.,  wheelchair or bedside chair)?: A Little Help needed to walk in hospital room?: A Lot Help needed climbing 3-5 steps with a railing? : Total 6 Click Score: 16    End of Session Equipment Utilized During Treatment: Gait belt Activity Tolerance: Patient limited by pain Patient left: in chair;with call bell/phone within reach Nurse Communication: Mobility status PT Visit Diagnosis: Pain;Other abnormalities of gait and mobility (R26.89);Difficulty in walking, not elsewhere classified (R26.2);Unsteadiness on feet (R26.81) Pain - Right/Left: Left Pain - part of body: Leg    Time: 6962-9528 PT Time Calculation (min) (ACUTE ONLY): 18 min   Charges:   PT Evaluation $PT Eval Low Complexity: 1 Low          Tanaiya Kolarik B. Migdalia Dk PT, DPT Acute Rehabilitation Services Pager [phone] Office [phone]   [place] 06/19/2018, 1:52 PM

## 2018-06-19 NOTE — Discharge Instructions (Signed)

## 2018-06-19 NOTE — Progress Notes (Signed)
Pharmacy Antibiotic Note  [name] is a 59 y.o. male admitted on 06/16/2018 with cellulitis.  Pt was seen in ED 2/22 and started on PO Doxy without improvement.  Pharmacy has been consulted for Vancomycin dosing.  SCr is improving. WBC remains within normal limits and patient remains afebrile.  Patient is still complaining of significant pain likely from claudication per notes.   Plan: Change vancomycin to 1250mg  IV every 12 hours Monitor clinical picture, renal function, vanc levels prn F/U C&S, abx deescalation / LOT  Height: 5\' 7"  (170.2 cm) Weight: 187 lb 6.3 oz (85 kg) IBW/kg (Calculated) : 66.1  Temp (24hrs), Avg:97.8 F (36.6 C), Min:96.1 F (35.6 C), Max:98.6 F (37 C)  Recent Labs  Lab 06/16/18 1106 06/17/18 0321 06/18/18 0250  WBC 9.5 9.6 9.6  CREATININE 0.95 1.06 0.86    Estimated Creatinine Clearance: 100 mL/min (by C-G formula based on SCr of 0.86 mg/dL).    Allergies  Allergen Reactions  . Codeine Shortness Of Breath and Itching  . Gabapentin Other (See Comments)    Suicidal thoughts, extreme dreams  . Pregabalin     Suicidal thoughts,extreme dreams   . Simvastatin     Pain in joints  . Hydrocodone Nausea Only and Rash    Breathing problems   . Lisinopril Itching and Rash  . Venlafaxine Rash    Sloan Leiter, PharmD, BCPS, BCCCP Clinical Pharmacist Please refer to [facility] for [place] numbers 06/19/2018 12:03 PM

## 2018-06-20 LAB — GLUCOSE, CAPILLARY
Glucose-Capillary: 148 mg/dL — ABNORMAL HIGH (ref 70–99)
Glucose-Capillary: 209 mg/dL — ABNORMAL HIGH (ref 70–99)

## 2018-06-20 MED ORDER — HYDROXYZINE HCL 25 MG PO TABS: 25.0000 mg | ORAL_TABLET | Freq: Four times a day (QID) | ORAL | 0 refills | Status: DC | PRN

## 2018-06-20 NOTE — Discharge Summary (Signed)
Discharge Summary  [name] TDV:[id number] DOB: 17-Feb-1961  PCP: Concepcion Elk, MD  Admit date: 06/16/2018 Discharge date: 06/20/2018  Time spent: 35 minutes  Recommendations for Outpatient Follow-up:  1. Follow up with Vascular surgery 2. Follow up with PCP 3. Take your medications as prescribed   Discharge Diagnoses:  Active Hospital Problems   Diagnosis Date Noted  . Superficial bacterial skin infection 06/16/2018  . Bacterial skin infection 06/16/2018  . CAD (coronary artery disease) 08/29/2011  . Tobacco abuse 08/29/2011  . Hypertension 08/29/2011  . Hyperlipidemia 08/29/2011  . Diabetes mellitus ([place]) 08/29/2011    Resolved Hospital Problems  No resolved problems to display.    Discharge Condition: Stable   Diet recommendation: Resume previous diet   Vitals:   06/20/18 0357 06/20/18 1231  BP: (!) 149/81 119/70  Pulse: (!) 56 60  Resp: 18 18  Temp: (!) 97.4 F (36.3 C)   SpO2: 97% 99%    History of present illness:  58 year old male with history of type 2 diabetes, chronic eczema, history of CAD and PAD with stents, ongoing tobacco abuse, hypertension, dyslipidemia has had recurrent eczematous dermatitis off and on for the past 10 years, has been followed by dermatology, biopsies etc. -2 to 3 weeks ago patient developed severe redness scaling inflammation and tenderness in his left lower leg with scaling, itching. He was seen in the emergency room and treated with doxycycline and prednisone due to worsening redness and swelling came back to the emergency room 2/24and was admitted, started IV vancomycin.  06/20/18: seen and examined at his bedside. Pain in his left leg. ABI revealed mild left lower extremity atrial disease. Patient advised to keep his appointment with vascular surgery. He understands and agrees to plan.  On the day of discharge, the patient was hemodynamically stable. He will need to follow up with vascular surgery, his dermatologist, and  PCP post hospitalization. He will also need to take his medications as prescribed.  Hospital Course:  Principal Problem:   Superficial bacterial skin infection Active Problems:   CAD (coronary artery disease)   Tobacco abuse   Hypertension   Hyperlipidemia   Diabetes mellitus ([place])   Bacterial skin infection   Cellulitis/secondary bacterial infection in the background of chronic eczematous dermatitis -Discontinued steroids due to ongoing infection, diabetes -Clinically improving, completed 5 days of IV vancomycin -Venous duplex was negative for DVT -Elevate left lower extremity,consult [facility] forBoot -Followed by local dermatologist, has an appointment next week, advised patient to keep this  Severe left lower extremity pain Possibly from peripheral artery disease Unable to bear weight on the left leg due to significant pain PT recommends home health PT ABI revealed mild arterial disease Follows with Dr. Trula Slade outpatient Dorsalis pedis pulse palpable but faint  History of PAD -Followed by Dr. Trula Slade, underwent balloon angioplasty and stenting of right SFA in 11/2015, also history of right distal SFA, proximal popliteal artery occlusion and stenting of right CIA on 12/2014 -Reports symptoms suggestive of claudication in his left leg for the last 6 months, DP pulses are diminished but palpable -Due to follow-up with vascular surgery in March, 2020 -Continue aspirin, statin, smoking cessation encouraged  Type 2 diabetes mellitus -Resume home meds Metformin and Jardiance  -Followed by endocrinologist also just started Trulicity reportedly -Hemoglobin A1c is 10, wants to avoid insulin at this time  Hypertension -Stable, continue current meds  History of CAD -Stable, continue aspirin statin and beta-blocker  Tobacco abuse -Smoking cessation counseled, does not want a  nicotine patch   Code Status:Full code     Discharge Exam: BP 119/70 (BP Location:  Right Arm)   Pulse 60   Temp (!) 97.4 F (36.3 C) (Oral)   Resp 18   Ht 5\' 7"  (1.702 m)   Wt 85 kg   SpO2 99%   BMI 29.35 kg/m  . General: 58 y.o. year-old male well developed well nourished in no acute distress.  Alert and oriented x3. . Cardiovascular: Regular rate and rhythm with no rubs or gallops.  No thyromegaly or JVD noted.   [name] Kitchen Respiratory: Clear to auscultation with no wheezes or rales. Good inspiratory effort. . Abdomen: Soft nontender nondistended with normal bowel sounds x4 quadrants. . Musculoskeletal: Trace lower extremity edema. [name] Kitchen Psychiatry: Mood is appropriate for condition and setting  Discharge Instructions You were cared for by a hospitalist during your hospital stay. If you have any questions about your discharge medications or the care you received while you were in the hospital after you are discharged, you can call the unit and asked to speak with the hospitalist on call if the hospitalist that took care of you is not available. Once you are discharged, your primary care physician will handle any further medical issues. Please note that NO REFILLS for any discharge medications will be authorized once you are discharged, as it is imperative that you return to your primary care physician (or establish a relationship with a primary care physician if you do not have one) for your aftercare needs so that they can reassess your need for medications and monitor your lab values.   Allergies as of 06/20/2018      Reactions   Codeine Shortness Of Breath, Itching   Gabapentin Other (See Comments)   Suicidal thoughts, extreme dreams   Pregabalin    Suicidal thoughts,extreme dreams    Simvastatin    Pain in joints   Hydrocodone Nausea Only, Rash   Breathing problems    Lisinopril Itching, Rash   Venlafaxine Rash      Medication List    TAKE these medications   acetaminophen 500 MG tablet Commonly known as:  TYLENOL Take 1,000 mg by mouth every 6 (six) hours as  needed for headache.   aspirin EC 81 MG tablet Take 81 mg by mouth daily.   carvedilol 25 MG tablet Commonly known as:  COREG Take 25 mg by mouth 2 (two) times daily with a meal.   doxycycline 100 MG capsule Commonly known as:  VIBRAMYCIN Take 1 capsule (100 mg total) by mouth 2 (two) times daily for 7 days.   eucerin cream Apply topically daily.   hydrOXYzine 25 MG tablet Commonly known as:  ATARAX/VISTARIL Take 1 tablet (25 mg total) by mouth every 6 (six) hours as needed for itching.   ibuprofen 200 MG tablet Commonly known as:  ADVIL,MOTRIN Take 400 mg by mouth every 6 (six) hours as needed for moderate pain.   JARDIANCE 25 MG Tabs tablet Generic drug:  empagliflozin Take 25 mg by mouth daily.   metFORMIN 1000 MG tablet Commonly known as:  GLUCOPHAGE Take 1,000 mg by mouth 2 (two) times daily with a meal.   rosuvastatin 10 MG tablet Commonly known as:  CRESTOR Take 10 mg by mouth daily.   triamcinolone cream 0.1 % Commonly known as:  KENALOG Apply 1 application topically 2 (two) times daily. What changed:    when to take this  reasons to take this  Durable Medical Equipment  (From admission, onward)         Start     Ordered   06/20/18 1606  For home use only DME 3 n 1  Once    Comments:  DME 3 in 1 bedside comode   06/20/18 1605   06/20/18 1531  For home use only DME 3 n 1  Once     06/20/18 1531   06/20/18 1112  For home use only DME wheelchair cushion (seat and back)  Once     06/20/18 1113   06/20/18 1112  For home use only DME lightweight manual wheelchair with seat cushion  Once    Comments:  Patient suffers from ambulatory dysfunction which impairs their ability to perform daily activities like walking in the home.  A walking aid will not resolve issue with performing activities of daily living. A wheelchair will allow patient to safely perform daily activities. Patient is not able to propel themselves in the home using a standard  weight wheelchair due to weakness. Patient can self propel in the lightweight wheelchair.  Accessories: elevating leg rests, wheel locks, extensions and anti-tippers.   06/20/18 1113   06/20/18 1111  For home use only DME Walker rolling  Once    Comments:  5" wheels  Question:  Patient needs a walker to treat with the following condition  Answer:  Ambulatory dysfunction   06/20/18 1113         Allergies  Allergen Reactions  . Codeine Shortness Of Breath and Itching  . Gabapentin Other (See Comments)    Suicidal thoughts, extreme dreams  . Pregabalin     Suicidal thoughts,extreme dreams   . Simvastatin     Pain in joints  . Hydrocodone Nausea Only and Rash    Breathing problems   . Lisinopril Itching and Rash  . Venlafaxine Rash   Follow-up Information    Concepcion Elk, MD. Call in 1 day(s).   Specialty:  Internal Medicine Why:  Please call for a post hospital follow up appointment. Contact information: [address] [place] [address] [phone]        Serafina Mitchell, MD. Call in 1 day(s).   Specialties:  Vascular Surgery, Cardiology Why:  Please call for a post hospital follow up appointment Contact information: [place] [address] [phone]        Home[facility] Follow up.   Specialty:  [place] Why:  A representative from [facility] will cointact you to arrange start date and time for your therapy. Contact information: [address] [place] [address] [phone]            The results of significant diagnostics from this hospitalization (including imaging, microbiology, ancillary and laboratory) are listed below for reference.    Significant Diagnostic Studies: Vas [place] Abi With/wo Tbi  Result Date: 06/19/2018 LOWER EXTREMITY DOPPLER STUDY Indications: Rest pain, peripheral artery disease, and History of right femoral              artery stent. High Risk         Hypertension,  hyperlipidemia, Diabetes, coronary artery Factors:          disease.  Comparison Study: 10/01/2017- Rt=1.01 Lt=1.07 Performing Technologist: Maudry Mayhew MHA, RVT, RDCS, RDMS  Examination Guidelines: A complete evaluation includes at minimum, Doppler waveform signals and systolic blood pressure reading at the level of bilateral brachial, anterior tibial, and posterior  tibial arteries, when vessel segments are accessible. Bilateral testing is considered an integral part of a complete examination. Photoelectric Plethysmograph (PPG) waveforms and toe systolic pressure readings are included as required and additional duplex testing as needed. Limited examinations for reoccurring indications may be performed as noted.  ABI Findings: +---------+------------------+-----+----------+--------+ Right    Rt Pressure (mmHg)IndexWaveform  Comment  +---------+------------------+-----+----------+--------+ Brachial 105                    triphasic          +---------+------------------+-----+----------+--------+ PTA      149               1.13 monophasic         +---------+------------------+-----+----------+--------+ DP       119               0.90 monophasic         +---------+------------------+-----+----------+--------+ Great Toe67                0.51                    +---------+------------------+-----+----------+--------+ +---------+------------------+-----+----------+-------+ Left     Lt Pressure (mmHg)IndexWaveform  Comment +---------+------------------+-----+----------+-------+ Brachial 132                    triphasic         +---------+------------------+-----+----------+-------+ PTA      110               0.83 monophasic        +---------+------------------+-----+----------+-------+ DP       118               0.89 monophasic        +---------+------------------+-----+----------+-------+ Great Toe44                0.33                    +---------+------------------+-----+----------+-------+ +-------+-----------+-----------+------------+------------+ ABI/TBIToday's ABIToday's TBIPrevious ABIPrevious TBI +-------+-----------+-----------+------------+------------+ Right  1.13       0.51       1.01        0.60         +-------+-----------+-----------+------------+------------+ Left   0.89       0.33       1.07        0.57         +-------+-----------+-----------+------------+------------+  Summary: Right: Resting right ankle-brachial index is within normal range , however given abnormal waveforms and history of peripheral arterial disease this is likely falsely elevated. Left: Resting left ankle-brachial index indicates mild left lower extremity arterial disease, however this may also be falsely elevated given abnormal waveforms.  *See table(s) above for measurements and observations.  Electronically signed by Monica Martinez MD on 06/19/2018 at 4:22:43 PM.    Final    Vas [place] Lower Extremity Venous (dvt) (mc And Wl 7a-7p)  Result Date: 06/16/2018  Lower Venous Study Indications: Edema, and cellulitis.  Comparison Study: Prior study from 10/03/17 is available for comparison Performing Technologist: Sharion Dove RVS  Examination Guidelines: A complete evaluation includes B-mode imaging, spectral Doppler, color Doppler, and power Doppler as needed of all accessible portions of each vessel. Bilateral testing is considered an integral part of a complete examination. Limited examinations for reoccurring indications may be performed as noted.  Right Venous Findings: +---+---------------+---------+-----------+----------+-------+    CompressibilityPhasicitySpontaneityPropertiesSummary +---+---------------+---------+-----------+----------+-------+ CFVFull           Yes  Yes                          +---+---------------+---------+-----------+----------+-------+  Left Venous Findings:  +---------+---------------+---------+-----------+----------+-------+          CompressibilityPhasicitySpontaneityPropertiesSummary +---------+---------------+---------+-----------+----------+-------+ CFV      Full           Yes      Yes                          +---------+---------------+---------+-----------+----------+-------+ SFJ      Full                                                 +---------+---------------+---------+-----------+----------+-------+ FV Prox  Full                                                 +---------+---------------+---------+-----------+----------+-------+ FV Mid   Full                                                 +---------+---------------+---------+-----------+----------+-------+ FV DistalFull                                                 +---------+---------------+---------+-----------+----------+-------+ PFV      Full                                                 +---------+---------------+---------+-----------+----------+-------+ POP      Full           Yes      Yes                          +---------+---------------+---------+-----------+----------+-------+ PTV      Full                                                 +---------+---------------+---------+-----------+----------+-------+ PERO     Full                                                 +---------+---------------+---------+-----------+----------+-------+ GSV      Full                                                 +---------+---------------+---------+-----------+----------+-------+    Summary: Right: No evidence of common femoral vein obstruction. Left: There is no evidence of deep  vein thrombosis in the lower extremity.There is no evidence of superficial venous thrombosis. Ultrasound characteristics of enlarged lymph nodes noted in the groin.  *See table(s) above for measurements and observations. Electronically signed by Ruta Hinds  MD on 06/16/2018 at 3:32:42 PM.    Final     Microbiology: No results found for this or any previous visit (from the past 240 hour(s)).   Labs: Basic Metabolic Panel: Recent Labs  Lab 06/16/18 1106 06/17/18 0321 06/18/18 0250  NA 135 138 138  K 4.9 4.4 3.8  CL 105 107 110  CO2 20* 23 20*  GLUCOSE 262* 201* 167*  BUN 23* 22* 20  CREATININE 0.95 1.06 0.86  CALCIUM 9.3 9.0 8.6*   Liver Function Tests: Recent Labs  Lab 06/16/18 1106  AST 29  ALT 23  ALKPHOS 59  BILITOT 1.3*  PROT 6.1*  ALBUMIN 3.7   No results for input(s): LIPASE, AMYLASE in the last 168 hours. No results for input(s): AMMONIA in the last 168 hours. CBC: Recent Labs  Lab 06/16/18 1106 06/17/18 0321 06/18/18 0250  WBC 9.5 9.6 9.6  NEUTROABS 6.9  --   --   HGB 15.7 15.9 15.1  HCT 48.9 49.2 46.3  MCV 91.2 92.1 91.1  PLT 286 274 253   Cardiac Enzymes: No results for input(s): CKTOTAL, CKMB, CKMBINDEX, TROPONINI in the last 168 hours. BNP: BNP (last 3 results) No results for input(s): BNP in the last 8760 hours.  ProBNP (last 3 results) No results for input(s): PROBNP in the last 8760 hours.  CBG: Recent Labs  Lab 06/19/18 1210 06/19/18 1617 06/19/18 2045 06/20/18 0748 06/20/18 1229  GLUCAP 219* 137* 251* 148* 209*       Signed:  Kayleen Memos, MD [facility] 06/20/2018, 4:29 PM

## 2018-06-20 NOTE — Progress Notes (Signed)
Rounded on Pt at 17:55 to check if equipment were delivered, the room was empty.

## 2018-06-20 NOTE — Progress Notes (Signed)
Occupational Therapy Evaluation and Discharge Patient Details Name: [name] MRN: [id number] DOB: 01/27/1961 Today's Date: 06/20/2018    History of Present Illness Patient is 58 y/o male admitted to hospital with LLE cellultis secondary to chronic eczematous dermatitis. PMH includes CAD, tobacco abuse, HTN, HLD, DM, and PAD.    Clinical Impression   PTA Pt independent in ADL and mobility. Pt currently SEVERELY limited by 15/10 pain (Pt reported) throbbing "I feel like my foot is going to burst" when put in dependent position. He stated "I don't know how I am going to function at home" but was largely unwilling to participate in therapy - He was able to perform transfers with RW at min guard, educated on use of 3 in 1 and keep leg in elevated position, and safety for bathroom/using tub (with 3 in 1) Son present throughout and states that he lives with his mother (Patient's mother) who can usually "get by with cooking" Pt is primarily limited by pain - otherwise able to complete ADL - but presents with decreased independence in self-care tasks and transfers. He would continue to benefit from continued skilled OT in the acute setting but at Pt request OT will discharge from services.     Follow Up Recommendations  No OT follow up;Supervision - Intermittent    Equipment Recommendations  3 in 1 bedside commode    Recommendations for Other Services       Precautions / Restrictions Precautions Precautions: Fall Precaution Comments: excessive pain with L LE in dependent position  Restrictions Weight Bearing Restrictions: No      Mobility Bed Mobility Overal bed mobility: Independent                Transfers Overall transfer level: Needs assistance Equipment used: Rolling walker (2 wheeled) Transfers: Sit to/from Stand Sit to Stand: Min guard         General transfer comment: min guard for safety - dramatic increase in pain in dependent position    Balance Overall  balance assessment: No apparent balance deficits (not formally assessed) Sitting-balance support: Single extremity supported Sitting balance-[name] Scale: Poor Sitting balance - Comments: unable to sit with L LE in dependent postion requires UE support to be able to prop L LE back on bed   Standing balance support: Bilateral upper extremity supported Standing balance-[name] Scale: Poor Standing balance comment: requires UE support on RW to keep L LE elevated due to pain                            ADL either performed or assessed with clinical judgement   ADL Overall ADL's : Needs assistance/impaired Eating/Feeding: Modified independent   Grooming: Set up;Sitting Grooming Details (indicate cue type and reason): pain is unbearable with leg in dependent position Upper Body Bathing: Set up;Sitting   Lower Body Bathing: Minimal assistance;Sitting/lateral leans   Upper Body Dressing : Modified independent;Sitting   Lower Body Dressing: Moderate assistance;Sit to/from stand Lower Body Dressing Details (indicate cue type and reason): to manage pants over foot/ankle Toilet Transfer: Min guard;Ambulation;RW Toilet Transfer Details (indicate cue type and reason): severely increased pain Toileting- Clothing Manipulation and Hygiene: Minimal assistance;Sitting/lateral lean   Tub/ Shower Transfer: Tub transfer   Functional mobility during ADLs: Min guard;Rolling walker       Vision         Perception     Praxis      Pertinent Vitals/Pain Pain Assessment: 0-10 Pain  Score: 10-Worst pain ever("It's way past 10") Pain Location: L LE in dependent position  Pain Descriptors / Indicators: Burning;Cramping;Sharp;Stabbing;Throbbing("I feel like it's going to burst") Pain Intervention(s): Limited activity within patient's tolerance;Monitored during session;Repositioned(elevated)     Hand Dominance     Extremity/Trunk Assessment Upper Extremity Assessment Upper Extremity  Assessment: Overall WFL for tasks assessed   Lower Extremity Assessment Lower Extremity Assessment: LLE deficits/detail LLE Deficits / Details: cellulitis of LE, unable to tolerate in dependent position  LLE: Unable to fully assess due to pain LLE Sensation: (sensitive to touch)   Cervical / Trunk Assessment Cervical / Trunk Assessment: Normal   Communication Communication Communication: No difficulties   Cognition Arousal/Alertness: Awake/alert Behavior During Therapy: WFL for tasks assessed/performed Overall Cognitive Status: Within Functional Limits for tasks assessed                                     General Comments  Pt irriated throughout session "I just want to get out of here, I will take out this IV and walk out of here!"    Exercises     Shoulder Instructions      Home Living Family/patient expects to be discharged to:: Private residence Living Arrangements: Parent Available Help at Discharge: Family;Available 24 hours/day Type of Home: House Home Access: Stairs to enter [employer] of Steps: 3 Entrance Stairs-Rails: Right Home Layout: One level     Bathroom Shower/Tub: Teacher, early years/pre: Standard     Home Equipment: Cane - quad;Shower seat          Prior Functioning/Environment Level of Independence: Independent                 OT Problem List: Decreased activity tolerance;Impaired balance (sitting and/or standing);Decreased safety awareness;Decreased knowledge of use of DME or AE;Pain      OT Treatment/Interventions:      OT Goals(Current goals can be found in the care plan section) Acute Rehab OT Goals Patient Stated Goal: go home OT Goal Formulation: With patient Time For Goal Achievement: 07/04/18 Potential to Achieve Goals: Good  OT Frequency:     Barriers to D/C:            Co-evaluation              AM-PAC OT "6 Clicks" Daily Activity     Outcome Measure Help from another  person eating meals?: None Help from another person taking care of personal grooming?: A Little Help from another person toileting, which includes using toliet, bedpan, or urinal?: A Little Help from another person bathing (including washing, rinsing, drying)?: A Little Help from another person to put on and taking off regular upper body clothing?: None Help from another person to put on and taking off regular lower body clothing?: A Lot 6 Click Score: 19   End of Session Equipment Utilized During Treatment: Rolling walker Nurse Communication: Mobility status  Activity Tolerance: Patient limited by pain Patient left: in bed;with call bell/phone within reach;with family/visitor present;Other (comment)(LLE elevated on pillows)  OT Visit Diagnosis: Unsteadiness on feet (R26.81);Other abnormalities of gait and mobility (R26.89);Pain Pain - Right/Left: Left Pain - part of body: Ankle and joints of foot                Time: 3383-2919 OT Time Calculation (min): 10 min Charges:  OT General Charges $OT Visit: 1 Visit OT Evaluation $OT Eval Low  Complexity: Leonidas OTR/L Acute Rehabilitation Services Pager: [phone] Office: [place] 06/20/2018, 3:52 PM

## 2018-06-20 NOTE — Plan of Care (Signed)

## 2018-06-20 NOTE — Progress Notes (Signed)
Physical Therapy Treatment Patient Details Name: [name] MRN: [id number] DOB: [birth year] Today's Date: 06/20/2018    History of Present Illness Patient is 58 y/o male admitted to hospital with LLE cellultis secondary to chronic eczematous dermatitis. PMH includes CAD, tobacco abuse, HTN, HLD, DM, and PAD.     PT Comments    On entry pt supine in bed with L LE propped up above level of head and heart. Pt agitated as he wants to leave despite not having d/c, prescriptions or DME. CM/OT in room also advising to wait on prescriptions and DME. Once pt calmed down, able to talk pt in to short distance ambulation to determine if he still needs wheelchair for d/c. Pt independent with bed mobility, and min guard for transfers and ambulation of 8 feet. Pt with increasing instability with distance due to increased pain with L LE in dependent position and immediately returning to supine with L LE up in the air. PT continues to recommend wheelchair for home mobility to maintain safety.      Follow Up Recommendations  Home health PT;Supervision - Intermittent     Equipment Recommendations  Rolling walker with 5" wheels;Wheelchair (measurements PT);Wheelchair cushion (measurements PT)    Recommendations for Other Services       Precautions / Restrictions Precautions Precautions: Fall Precaution Comments: excessive pain with L LE in dependent position  Restrictions Weight Bearing Restrictions: No    Mobility  Bed Mobility Overal bed mobility: Independent                Transfers Overall transfer level: Needs assistance Equipment used: Rolling walker (2 wheeled) Transfers: Sit to/from Stand Sit to Stand: Min guard         General transfer comment: min guard for safety - dramatic increase in pain in dependent position  Ambulation/Gait Ambulation/Gait assistance: Min guard Gait Distance (Feet): 8 Feet Assistive device: Rolling walker (2 wheeled) Gait  Pattern/deviations: Step-through pattern;Decreased step length - right;Decreased stance time - left;Decreased weight shift to left;Antalgic;Trunk flexed Gait velocity: decreased Gait velocity interpretation: <1.31 ft/sec, indicative of household ambulator General Gait Details: Pt requires min guard assist for ambulation of 8 feet with increasing instability secondary to increase in L LE pain in dependent postion       Balance Overall balance assessment: No apparent balance deficits (not formally assessed) Sitting-balance support: Single extremity supported Sitting balance-[name] Scale: Poor Sitting balance - Comments: unable to sit with L LE in dependent postion requires UE support to be able to prop L LE back on bed   Standing balance support: Bilateral upper extremity supported Standing balance-[name] Scale: Poor Standing balance comment: requires UE support on RW to keep L LE elevated due to pain                             Cognition Arousal/Alertness: Awake/alert Behavior During Therapy: WFL for tasks assessed/performed Overall Cognitive Status: Within Functional Limits for tasks assessed                                           General Comments General comments (skin integrity, edema, etc.): Pt aggitated and threatening to leave without d/c. Encouraged by threrapy to wait for prescriptions and wheelchair so that he will be able to mobilize in his home environment with L LE elevated. Pt son in room  also encouraging pt to wait.       Pertinent Vitals/Pain Pain Assessment: 0-10 Pain Score: 10-Worst pain ever Pain Location: L LE in dependent position  Pain Descriptors / Indicators: Burning;Cramping;Sharp;Stabbing;Throbbing("I feel like it's going to burst") Pain Intervention(s): Limited activity within patient's tolerance;Monitored during session;Repositioned    Home Living Family/patient expects to be discharged to:: Private residence Living  Arrangements: Parent Available Help at Discharge: Family;Available 24 hours/day Type of Home: House Home Access: Stairs to enter Entrance Stairs-Rails: Right Home Layout: One level Home Equipment: Cane - quad;Shower seat      Prior Function Level of Independence: Independent          PT Goals (current goals can now be found in the care plan section) Acute Rehab PT Goals Patient Stated Goal: go home PT Goal Formulation: With patient Time For Goal Achievement: 07/03/18 Potential to Achieve Goals: Fair Progress towards PT goals: Not progressing toward goals - comment(continues to be limited by 10/10 pain in L LE in dependent )    Frequency    Min 3X/week      PT Plan Current plan remains appropriate       AM-PAC PT "6 Clicks" Mobility   Outcome Measure  Help needed turning from your back to your side while in a flat bed without using bedrails?: None Help needed moving from lying on your back to sitting on the side of a flat bed without using bedrails?: None Help needed moving to and from a bed to a chair (including a wheelchair)?: A Little Help needed standing up from a chair using your arms (e.g., wheelchair or bedside chair)?: A Little Help needed to walk in hospital room?: A Little Help needed climbing 3-5 steps with a railing? : Total 6 Click Score: 18    End of Session   Activity Tolerance: Patient limited by pain Patient left: in bed;with family/visitor present;with call bell/phone within reach Nurse Communication: Mobility status;Other (comment)(need for wheelchair) PT Visit Diagnosis: Pain;Other abnormalities of gait and mobility (R26.89);Difficulty in walking, not elsewhere classified (R26.2);Unsteadiness on feet (R26.81) Pain - Right/Left: Left Pain - part of body: Leg     Time: 3338-3291 PT Time Calculation (min) (ACUTE ONLY): 10 min  Charges:  $Gait Training: 8-22 mins                     Marcos Peloso B. Migdalia Dk PT, DPT Acute Rehabilitation  Services Pager [phone] Office [phone]    [place] 06/20/2018, 3:58 PM

## 2018-06-20 NOTE — Progress Notes (Signed)
Provided discharge education/instructions, all questions and concerns addressed, Pt not in distress, son at bedside, advised to wait for [facility] equipment prior to discharge, Pt and son verbalized understanding.

## 2018-06-20 NOTE — Progress Notes (Signed)
DME Letter of Medical Necessity   Patient suffers from 10/10 L LE claudication pain which causes instability with L LE in dependent position which impairs their ability to perform daily activities like mobilization in the home.  A walker alone will not resolve the issues with performing activities of daily living. A wheelchair will allow patient to safely perform daily activities.  The patient can self propel in the home or has a caregiver who can provide assistance.     Maddyson Keil B. Migdalia Dk PT, DPT Acute Rehabilitation Services Pager [phone] Office [phone]

## 2018-06-20 NOTE — Care Management Note (Addendum)
Case Management Note  Patient Details  Name: WOFFORD STRATTON MRN: [id number] Date of Birth: 27-Jan-1961  Subjective/Objective:   58 yr old male admitted with left lower leg cellulitis, pain.Unable to walk or weight bear on left leg.                 Action/Plan:  Case manager spoke with patient concerning discharge plan and DME needs. Referral for Home Health therapy called to Joen Laura, [facility]. DME has been requested. Patient will have support at discharge.     Expected Discharge Date:    06/20/18              Expected Discharge Plan:  [place]  In-House Referral:  NA  Discharge planning Services  CM Consult  Post Acute Care Choice:  Durable Medical Equipment, Home Health Choice offered to:  Patient  DME Arranged:  3-N-1, Wheelchair manual, Walker rolling DME Agency:  [place]:  PT, Respirator Therapy [place] Agency:  [facility]  Status of Service:  Completed, signed off  If discussed at [employer], dates discussed:    Additional Comments:  Ninfa Meeker, RN 06/20/2018, 3:35 PM

## 2018-07-09 ENCOUNTER — Other Ambulatory Visit: Payer: Self-pay

## 2018-07-09 DIAGNOSIS — I779 Disorder of arteries and arterioles, unspecified: Secondary | ICD-10-CM

## 2018-07-18 ENCOUNTER — Ambulatory Visit: Payer: Medicaid Other | Admitting: Family

## 2018-07-18 ENCOUNTER — Encounter (HOSPITAL_COMMUNITY): Payer: Medicaid Other

## 2018-07-18 ENCOUNTER — Inpatient Hospital Stay (HOSPITAL_COMMUNITY): Admission: RE | Admit: 2018-07-18 | Payer: Medicaid Other | Source: Ambulatory Visit

## 2019-10-24 ENCOUNTER — Inpatient Hospital Stay (HOSPITAL_COMMUNITY)
Admission: EM | Admit: 2019-10-24 | Discharge: 2019-11-06 | DRG: 234 | Disposition: A | Discharge status: Home / Self Care | Payer: Medicaid Other | Attending: Cardiothoracic Surgery | Admitting: Cardiothoracic Surgery

## 2019-10-24 ENCOUNTER — Other Ambulatory Visit: Payer: Self-pay

## 2019-10-24 ENCOUNTER — Encounter (HOSPITAL_COMMUNITY): Payer: Self-pay | Admitting: Emergency Medicine

## 2019-10-24 ENCOUNTER — Emergency Department (HOSPITAL_COMMUNITY): Payer: Medicaid Other

## 2019-10-24 DIAGNOSIS — I5021 Acute systolic (congestive) heart failure: Secondary | ICD-10-CM

## 2019-10-24 DIAGNOSIS — E1151 Type 2 diabetes mellitus with diabetic peripheral angiopathy without gangrene: Secondary | ICD-10-CM | POA: Diagnosis present

## 2019-10-24 DIAGNOSIS — Z951 Presence of aortocoronary bypass graft: Secondary | ICD-10-CM

## 2019-10-24 DIAGNOSIS — Z833 Family history of diabetes mellitus: Secondary | ICD-10-CM

## 2019-10-24 DIAGNOSIS — I872 Venous insufficiency (chronic) (peripheral): Secondary | ICD-10-CM | POA: Diagnosis present

## 2019-10-24 DIAGNOSIS — R0602 Shortness of breath: Secondary | ICD-10-CM

## 2019-10-24 DIAGNOSIS — F17213 Nicotine dependence, cigarettes, with withdrawal: Secondary | ICD-10-CM | POA: Diagnosis present

## 2019-10-24 DIAGNOSIS — Z8249 Family history of ischemic heart disease and other diseases of the circulatory system: Secondary | ICD-10-CM

## 2019-10-24 DIAGNOSIS — Z7982 Long term (current) use of aspirin: Secondary | ICD-10-CM

## 2019-10-24 DIAGNOSIS — Z7984 Long term (current) use of oral hypoglycemic drugs: Secondary | ICD-10-CM

## 2019-10-24 DIAGNOSIS — I5043 Acute on chronic combined systolic (congestive) and diastolic (congestive) heart failure: Secondary | ICD-10-CM | POA: Diagnosis present

## 2019-10-24 DIAGNOSIS — J90 Pleural effusion, not elsewhere classified: Secondary | ICD-10-CM | POA: Diagnosis present

## 2019-10-24 DIAGNOSIS — Z720 Tobacco use: Secondary | ICD-10-CM | POA: Diagnosis present

## 2019-10-24 DIAGNOSIS — G8929 Other chronic pain: Secondary | ICD-10-CM | POA: Diagnosis present

## 2019-10-24 DIAGNOSIS — E669 Obesity, unspecified: Secondary | ICD-10-CM | POA: Diagnosis present

## 2019-10-24 DIAGNOSIS — Z888 Allergy status to other drugs, medicaments and biological substances status: Secondary | ICD-10-CM

## 2019-10-24 DIAGNOSIS — Z9689 Presence of other specified functional implants: Secondary | ICD-10-CM

## 2019-10-24 DIAGNOSIS — I2510 Atherosclerotic heart disease of native coronary artery without angina pectoris: Secondary | ICD-10-CM | POA: Diagnosis present

## 2019-10-24 DIAGNOSIS — E0800 Diabetes mellitus due to underlying condition with hyperosmolarity without nonketotic hyperglycemic-hyperosmolar coma (NKHHC): Secondary | ICD-10-CM

## 2019-10-24 DIAGNOSIS — Z9861 Coronary angioplasty status: Secondary | ICD-10-CM | POA: Diagnosis present

## 2019-10-24 DIAGNOSIS — I252 Old myocardial infarction: Secondary | ICD-10-CM

## 2019-10-24 DIAGNOSIS — E1165 Type 2 diabetes mellitus with hyperglycemia: Secondary | ICD-10-CM | POA: Diagnosis present

## 2019-10-24 DIAGNOSIS — E785 Hyperlipidemia, unspecified: Secondary | ICD-10-CM | POA: Diagnosis present

## 2019-10-24 DIAGNOSIS — R001 Bradycardia, unspecified: Secondary | ICD-10-CM | POA: Diagnosis present

## 2019-10-24 DIAGNOSIS — I10 Essential (primary) hypertension: Secondary | ICD-10-CM | POA: Diagnosis present

## 2019-10-24 DIAGNOSIS — Z9889 Other specified postprocedural states: Secondary | ICD-10-CM

## 2019-10-24 DIAGNOSIS — Z6830 Body mass index (BMI) 30.0-30.9, adult: Secondary | ICD-10-CM

## 2019-10-24 DIAGNOSIS — G4733 Obstructive sleep apnea (adult) (pediatric): Secondary | ICD-10-CM | POA: Diagnosis present

## 2019-10-24 DIAGNOSIS — I4891 Unspecified atrial fibrillation: Secondary | ICD-10-CM | POA: Diagnosis not present

## 2019-10-24 DIAGNOSIS — E1140 Type 2 diabetes mellitus with diabetic neuropathy, unspecified: Secondary | ICD-10-CM | POA: Diagnosis present

## 2019-10-24 DIAGNOSIS — Z79899 Other long term (current) drug therapy: Secondary | ICD-10-CM

## 2019-10-24 DIAGNOSIS — Z452 Encounter for adjustment and management of vascular access device: Secondary | ICD-10-CM

## 2019-10-24 DIAGNOSIS — I6521 Occlusion and stenosis of right carotid artery: Secondary | ICD-10-CM

## 2019-10-24 DIAGNOSIS — Z8616 Personal history of COVID-19: Secondary | ICD-10-CM

## 2019-10-24 DIAGNOSIS — I440 Atrioventricular block, first degree: Secondary | ICD-10-CM | POA: Diagnosis present

## 2019-10-24 DIAGNOSIS — I25110 Atherosclerotic heart disease of native coronary artery with unstable angina pectoris: Secondary | ICD-10-CM | POA: Diagnosis present

## 2019-10-24 DIAGNOSIS — Z841 Family history of disorders of kidney and ureter: Secondary | ICD-10-CM

## 2019-10-24 DIAGNOSIS — I509 Heart failure, unspecified: Secondary | ICD-10-CM

## 2019-10-24 DIAGNOSIS — Z955 Presence of coronary angioplasty implant and graft: Secondary | ICD-10-CM

## 2019-10-24 DIAGNOSIS — E119 Type 2 diabetes mellitus without complications: Secondary | ICD-10-CM

## 2019-10-24 DIAGNOSIS — N179 Acute kidney failure, unspecified: Secondary | ICD-10-CM | POA: Diagnosis present

## 2019-10-24 DIAGNOSIS — I110 Hypertensive heart disease with heart failure: Principal | ICD-10-CM | POA: Diagnosis present

## 2019-10-24 DIAGNOSIS — I5041 Acute combined systolic (congestive) and diastolic (congestive) heart failure: Secondary | ICD-10-CM

## 2019-10-24 DIAGNOSIS — R601 Generalized edema: Secondary | ICD-10-CM

## 2019-10-24 DIAGNOSIS — Z9119 Patient's noncompliance with other medical treatment and regimen: Secondary | ICD-10-CM

## 2019-10-24 DIAGNOSIS — Z885 Allergy status to narcotic agent status: Secondary | ICD-10-CM

## 2019-10-24 LAB — BASIC METABOLIC PANEL
Anion gap: 11 (ref 5–15)
BUN: 22 mg/dL — ABNORMAL HIGH (ref 6–20)
CO2: 25 mmol/L (ref 22–32)
Calcium: 8.7 mg/dL — ABNORMAL LOW (ref 8.9–10.3)
Chloride: 105 mmol/L (ref 98–111)
Creatinine, Ser: 1.33 mg/dL — ABNORMAL HIGH (ref 0.61–1.24)
GFR calc Af Amer: >60 mL/min (ref >60)
GFR calc non Af Amer: 59 mL/min — ABNORMAL LOW (ref >60)
Glucose, Bld: 198 mg/dL — ABNORMAL HIGH (ref 70–99)
Potassium: 4.3 mmol/L (ref 3.5–5.1)
Sodium: 141 mmol/L (ref 135–145)

## 2019-10-24 LAB — TROPONIN I (HIGH SENSITIVITY)
Troponin I (High Sensitivity): 17 ng/L (ref <18)
Troponin I (High Sensitivity): 19 ng/L — ABNORMAL HIGH (ref <18)

## 2019-10-24 LAB — CBC
HCT: 53.9 % — ABNORMAL HIGH (ref 39.0–52.0)
Hemoglobin: 17.1 g/dL — ABNORMAL HIGH (ref 13.0–17.0)
MCH: 28.8 pg (ref 26.0–34.0)
MCHC: 31.7 g/dL (ref 30.0–36.0)
MCV: 90.7 fL (ref 80.0–100.0)
Platelets: 222 10*3/uL (ref 150–400)
RBC: 5.94 MIL/uL — ABNORMAL HIGH (ref 4.22–5.81)
RDW: 15.4 % (ref 11.5–15.5)
WBC: 6.6 10*3/uL (ref 4.0–10.5)
nRBC: 0 % (ref 0.0–0.2)

## 2019-10-24 LAB — BRAIN NATRIURETIC PEPTIDE: B Natriuretic Peptide: 1913.6 pg/mL — ABNORMAL HIGH (ref 0.0–100.0)

## 2019-10-24 MED ORDER — SODIUM CHLORIDE 0.9% FLUSH
3.0000 mL | Freq: Once | INTRAVENOUS | Status: AC
  Administered 2019-10-25: 3 mL via INTRAVENOUS

## 2019-10-24 NOTE — ED Triage Notes (Signed)
Pt states she is having bilateral lower leg and scrotum swollen for the past week with some SOB.

## 2019-10-25 ENCOUNTER — Inpatient Hospital Stay (HOSPITAL_COMMUNITY): Payer: Medicaid Other

## 2019-10-25 ENCOUNTER — Encounter (HOSPITAL_COMMUNITY): Payer: Self-pay | Admitting: Internal Medicine

## 2019-10-25 DIAGNOSIS — Z7982 Long term (current) use of aspirin: Secondary | ICD-10-CM | POA: Diagnosis not present

## 2019-10-25 DIAGNOSIS — E1165 Type 2 diabetes mellitus with hyperglycemia: Secondary | ICD-10-CM | POA: Diagnosis not present

## 2019-10-25 DIAGNOSIS — Z720 Tobacco use: Secondary | ICD-10-CM | POA: Diagnosis not present

## 2019-10-25 DIAGNOSIS — Z7984 Long term (current) use of oral hypoglycemic drugs: Secondary | ICD-10-CM | POA: Diagnosis not present

## 2019-10-25 DIAGNOSIS — I5021 Acute systolic (congestive) heart failure: Secondary | ICD-10-CM | POA: Diagnosis present

## 2019-10-25 DIAGNOSIS — I200 Unstable angina: Secondary | ICD-10-CM | POA: Diagnosis not present

## 2019-10-25 DIAGNOSIS — I5043 Acute on chronic combined systolic (congestive) and diastolic (congestive) heart failure: Secondary | ICD-10-CM | POA: Diagnosis not present

## 2019-10-25 DIAGNOSIS — J90 Pleural effusion, not elsewhere classified: Secondary | ICD-10-CM | POA: Diagnosis not present

## 2019-10-25 DIAGNOSIS — I5041 Acute combined systolic (congestive) and diastolic (congestive) heart failure: Secondary | ICD-10-CM | POA: Diagnosis not present

## 2019-10-25 DIAGNOSIS — I480 Paroxysmal atrial fibrillation: Secondary | ICD-10-CM | POA: Diagnosis not present

## 2019-10-25 DIAGNOSIS — I6521 Occlusion and stenosis of right carotid artery: Secondary | ICD-10-CM | POA: Diagnosis not present

## 2019-10-25 DIAGNOSIS — E1151 Type 2 diabetes mellitus with diabetic peripheral angiopathy without gangrene: Secondary | ICD-10-CM | POA: Diagnosis not present

## 2019-10-25 DIAGNOSIS — I10 Essential (primary) hypertension: Secondary | ICD-10-CM | POA: Diagnosis not present

## 2019-10-25 DIAGNOSIS — I2510 Atherosclerotic heart disease of native coronary artery without angina pectoris: Secondary | ICD-10-CM | POA: Diagnosis not present

## 2019-10-25 DIAGNOSIS — N179 Acute kidney failure, unspecified: Secondary | ICD-10-CM | POA: Diagnosis not present

## 2019-10-25 DIAGNOSIS — E669 Obesity, unspecified: Secondary | ICD-10-CM | POA: Diagnosis not present

## 2019-10-25 DIAGNOSIS — E1140 Type 2 diabetes mellitus with diabetic neuropathy, unspecified: Secondary | ICD-10-CM | POA: Diagnosis not present

## 2019-10-25 DIAGNOSIS — I440 Atrioventricular block, first degree: Secondary | ICD-10-CM | POA: Diagnosis present

## 2019-10-25 DIAGNOSIS — I509 Heart failure, unspecified: Secondary | ICD-10-CM | POA: Diagnosis not present

## 2019-10-25 DIAGNOSIS — E7800 Pure hypercholesterolemia, unspecified: Secondary | ICD-10-CM | POA: Diagnosis not present

## 2019-10-25 DIAGNOSIS — Z79899 Other long term (current) drug therapy: Secondary | ICD-10-CM | POA: Diagnosis not present

## 2019-10-25 DIAGNOSIS — G8929 Other chronic pain: Secondary | ICD-10-CM | POA: Diagnosis present

## 2019-10-25 DIAGNOSIS — R001 Bradycardia, unspecified: Secondary | ICD-10-CM | POA: Diagnosis present

## 2019-10-25 DIAGNOSIS — I110 Hypertensive heart disease with heart failure: Secondary | ICD-10-CM | POA: Diagnosis not present

## 2019-10-25 DIAGNOSIS — I252 Old myocardial infarction: Secondary | ICD-10-CM | POA: Diagnosis not present

## 2019-10-25 DIAGNOSIS — G4733 Obstructive sleep apnea (adult) (pediatric): Secondary | ICD-10-CM | POA: Diagnosis present

## 2019-10-25 DIAGNOSIS — Z8616 Personal history of COVID-19: Secondary | ICD-10-CM | POA: Diagnosis not present

## 2019-10-25 DIAGNOSIS — I236 Thrombosis of atrium, auricular appendage, and ventricle as current complications following acute myocardial infarction: Secondary | ICD-10-CM | POA: Diagnosis not present

## 2019-10-25 DIAGNOSIS — Z01810 Encounter for preprocedural cardiovascular examination: Secondary | ICD-10-CM | POA: Diagnosis not present

## 2019-10-25 DIAGNOSIS — Z6830 Body mass index (BMI) 30.0-30.9, adult: Secondary | ICD-10-CM | POA: Diagnosis not present

## 2019-10-25 DIAGNOSIS — F17213 Nicotine dependence, cigarettes, with withdrawal: Secondary | ICD-10-CM | POA: Diagnosis not present

## 2019-10-25 DIAGNOSIS — I872 Venous insufficiency (chronic) (peripheral): Secondary | ICD-10-CM | POA: Diagnosis present

## 2019-10-25 DIAGNOSIS — I25110 Atherosclerotic heart disease of native coronary artery with unstable angina pectoris: Secondary | ICD-10-CM | POA: Diagnosis not present

## 2019-10-25 DIAGNOSIS — Z9119 Patient's noncompliance with other medical treatment and regimen: Secondary | ICD-10-CM | POA: Diagnosis not present

## 2019-10-25 DIAGNOSIS — E785 Hyperlipidemia, unspecified: Secondary | ICD-10-CM | POA: Diagnosis present

## 2019-10-25 DIAGNOSIS — I4891 Unspecified atrial fibrillation: Secondary | ICD-10-CM | POA: Diagnosis not present

## 2019-10-25 LAB — HEMOGLOBIN A1C
Hgb A1c MFr Bld: 10.6 % — ABNORMAL HIGH (ref 4.8–5.6)
Mean Plasma Glucose: 257.52 mg/dL

## 2019-10-25 LAB — GLUCOSE, CAPILLARY
Glucose-Capillary: 148 mg/dL — ABNORMAL HIGH (ref 70–99)
Glucose-Capillary: 185 mg/dL — ABNORMAL HIGH (ref 70–99)
Glucose-Capillary: 150 mg/dL — ABNORMAL HIGH (ref 70–99)
Glucose-Capillary: 200 mg/dL — ABNORMAL HIGH (ref 70–99)

## 2019-10-25 LAB — BASIC METABOLIC PANEL
Anion gap: 10 (ref 5–15)
BUN: 19 mg/dL (ref 6–20)
CO2: 28 mmol/L (ref 22–32)
Calcium: 9.1 mg/dL (ref 8.9–10.3)
Chloride: 104 mmol/L (ref 98–111)
Creatinine, Ser: 1.15 mg/dL (ref 0.61–1.24)
GFR calc Af Amer: >60 mL/min (ref >60)
GFR calc non Af Amer: >60 mL/min (ref >60)
Glucose, Bld: 126 mg/dL — ABNORMAL HIGH (ref 70–99)
Potassium: 3.8 mmol/L (ref 3.5–5.1)
Sodium: 142 mmol/L (ref 135–145)

## 2019-10-25 LAB — MAGNESIUM: Magnesium: 2 mg/dL (ref 1.7–2.4)

## 2019-10-25 LAB — ECHOCARDIOGRAM COMPLETE
Height: 67 in
Weight: 3489.6 oz

## 2019-10-25 LAB — HIV ANTIBODY (ROUTINE TESTING W REFLEX): HIV Screen 4th Generation wRfx: NONREACTIVE

## 2019-10-25 LAB — SARS CORONAVIRUS 2 BY RT PCR (HOSPITAL ORDER, PERFORMED IN ~~LOC~~ HOSPITAL LAB): SARS Coronavirus 2: NEGATIVE

## 2019-10-25 MED ORDER — SODIUM CHLORIDE 0.9% FLUSH
3.0000 mL | Freq: Two times a day (BID) | INTRAVENOUS | Status: DC
  Administered 2019-10-25 – 2019-10-28 (×9): 3 mL via INTRAVENOUS

## 2019-10-25 MED ORDER — LOSARTAN POTASSIUM 50 MG PO TABS
50.0000 mg | ORAL_TABLET | Freq: Every day | ORAL | Status: DC
  Administered 2019-10-26: 50 mg via ORAL
  Filled 2019-10-25: qty 1

## 2019-10-25 MED ORDER — EMPAGLIFLOZIN 25 MG PO TABS
25.0000 mg | ORAL_TABLET | Freq: Every day | ORAL | Status: DC
  Administered 2019-10-25 – 2019-10-28 (×4): 25 mg via ORAL
  Filled 2019-10-25 (×6): qty 1

## 2019-10-25 MED ORDER — FUROSEMIDE 10 MG/ML IJ SOLN
40.0000 mg | Freq: Once | INTRAMUSCULAR | Status: AC
  Administered 2019-10-25: 40 mg via INTRAVENOUS
  Filled 2019-10-25: qty 4

## 2019-10-25 MED ORDER — HYDROXYZINE HCL 25 MG PO TABS: 25.0000 mg | ORAL_TABLET | Freq: Four times a day (QID) | ORAL | Status: DC | PRN

## 2019-10-25 MED ORDER — FUROSEMIDE 10 MG/ML IJ SOLN
80.0000 mg | Freq: Two times a day (BID) | INTRAMUSCULAR | Status: DC
  Administered 2019-10-25 – 2019-10-27 (×4): 80 mg via INTRAVENOUS
  Filled 2019-10-25 (×4): qty 8

## 2019-10-25 MED ORDER — FUROSEMIDE 10 MG/ML IJ SOLN
20.0000 mg | Freq: Once | INTRAMUSCULAR | Status: AC
  Administered 2019-10-25: 20 mg via INTRAVENOUS
  Filled 2019-10-25: qty 2

## 2019-10-25 MED ORDER — SODIUM CHLORIDE 0.9 % IV SOLN: 250.0000 mL | INTRAVENOUS | Status: DC | PRN

## 2019-10-25 MED ORDER — ATORVASTATIN CALCIUM 10 MG PO TABS
20.0000 mg | ORAL_TABLET | Freq: Every day | ORAL | Status: DC
  Administered 2019-10-26 – 2019-10-27 (×2): 20 mg via ORAL
  Filled 2019-10-25 (×2): qty 2

## 2019-10-25 MED ORDER — ACETAMINOPHEN 325 MG PO TABS
650.0000 mg | ORAL_TABLET | ORAL | Status: DC | PRN
  Administered 2019-10-25 – 2019-10-26 (×2): 650 mg via ORAL
  Filled 2019-10-25: qty 2

## 2019-10-25 MED ORDER — LIDOCAINE 5 % EX PTCH
2.0000 | MEDICATED_PATCH | CUTANEOUS | Status: DC
  Administered 2019-10-25 – 2019-10-30 (×6): 2 via TRANSDERMAL
  Filled 2019-10-25 (×6): qty 2

## 2019-10-25 MED ORDER — INSULIN ASPART 100 UNIT/ML ~~LOC~~ SOLN
0.0000 [IU] | Freq: Three times a day (TID) | SUBCUTANEOUS | Status: DC
  Administered 2019-10-25 (×2): 3 [IU] via SUBCUTANEOUS
  Administered 2019-10-25: 4 [IU] via SUBCUTANEOUS
  Administered 2019-10-26: 3 [IU] via SUBCUTANEOUS
  Administered 2019-10-26 – 2019-10-27 (×3): 7 [IU] via SUBCUTANEOUS
  Administered 2019-10-27: 4 [IU] via SUBCUTANEOUS
  Administered 2019-10-27: 7 [IU] via SUBCUTANEOUS
  Administered 2019-10-28: 3 [IU] via SUBCUTANEOUS
  Administered 2019-10-29: 7 [IU] via SUBCUTANEOUS

## 2019-10-25 MED ORDER — CARVEDILOL 12.5 MG PO TABS
12.5000 mg | ORAL_TABLET | Freq: Two times a day (BID) | ORAL | Status: DC
  Administered 2019-10-25 – 2019-10-27 (×5): 12.5 mg via ORAL
  Filled 2019-10-25 (×5): qty 1

## 2019-10-25 MED ORDER — IPRATROPIUM-ALBUTEROL 0.5-2.5 (3) MG/3ML IN SOLN: 3.0000 mL | Freq: Four times a day (QID) | RESPIRATORY_TRACT | Status: DC | PRN

## 2019-10-25 MED ORDER — ONDANSETRON HCL 4 MG/2ML IJ SOLN: 4.0000 mg | Freq: Four times a day (QID) | INTRAMUSCULAR | Status: DC | PRN

## 2019-10-25 MED ORDER — SODIUM CHLORIDE 0.9% FLUSH: 3.0000 mL | INTRAVENOUS | Status: DC | PRN

## 2019-10-25 MED ORDER — FUROSEMIDE 10 MG/ML IJ SOLN
40.0000 mg | Freq: Two times a day (BID) | INTRAMUSCULAR | Status: DC
  Administered 2019-10-25: 40 mg via INTRAVENOUS
  Filled 2019-10-25: qty 4

## 2019-10-25 MED ORDER — ROSUVASTATIN CALCIUM 5 MG PO TABS
10.0000 mg | ORAL_TABLET | Freq: Every day | ORAL | Status: DC
  Administered 2019-10-25: 10 mg via ORAL
  Filled 2019-10-25: qty 2

## 2019-10-25 MED ORDER — INSULIN ASPART 100 UNIT/ML ~~LOC~~ SOLN
0.0000 [IU] | Freq: Three times a day (TID) | SUBCUTANEOUS | Status: DC
Start: 2019-10-25 — End: 2019-10-25

## 2019-10-25 MED ORDER — POTASSIUM CHLORIDE CRYS ER 20 MEQ PO TBCR
20.0000 meq | EXTENDED_RELEASE_TABLET | Freq: Two times a day (BID) | ORAL | Status: DC
  Administered 2019-10-25 (×2): 20 meq via ORAL
  Filled 2019-10-25 (×3): qty 1

## 2019-10-25 MED ORDER — ENOXAPARIN SODIUM 40 MG/0.4ML ~~LOC~~ SOLN
40.0000 mg | SUBCUTANEOUS | Status: DC
  Administered 2019-10-25 – 2019-10-26 (×2): 40 mg via SUBCUTANEOUS
  Filled 2019-10-25 (×2): qty 0.4

## 2019-10-25 MED ORDER — LOSARTAN POTASSIUM 25 MG PO TABS
25.0000 mg | ORAL_TABLET | Freq: Every day | ORAL | Status: DC
  Administered 2019-10-25: 25 mg via ORAL
  Filled 2019-10-25: qty 1

## 2019-10-25 MED ORDER — ASPIRIN EC 81 MG PO TBEC
81.0000 mg | DELAYED_RELEASE_TABLET | Freq: Every day | ORAL | Status: DC
  Administered 2019-10-25 – 2019-10-27 (×3): 81 mg via ORAL
  Filled 2019-10-25 (×5): qty 1

## 2019-10-25 MED ORDER — NICOTINE 21 MG/24HR TD PT24
21.0000 mg | MEDICATED_PATCH | Freq: Every day | TRANSDERMAL | Status: DC
  Administered 2019-10-25 – 2019-10-29 (×5): 21 mg via TRANSDERMAL
  Filled 2019-10-25 (×5): qty 1

## 2019-10-25 NOTE — ED Provider Notes (Signed)
[facility] EMERGENCY DEPARTMENT Provider Note   CSN: [id number] Arrival date & time: 10/24/19  1947     History Chief Complaint  Patient presents with  . Shortness of Breath    [name] is a 59 y.o. male.  Patient with history of CAD, HTN, HLD, DM, chronic back pain, PVD, continuous smoker, presents for evaluation of bilateral LE swelling that started about one month ago and has gotten progressively worse extending to thighs, groin and abdomen. He has chronic SOB but now has significantly worse SOB, DOE and orthopnea. No history of CHF. He describes a mild chest discomfort but denies significant chest pain. No fever, cough, nausea or vomiting.   The history is provided by the patient and the spouse. No language interpreter was used.  Shortness of Breath Associated symptoms: no abdominal pain, no fever and no vomiting        Past Medical History:  Diagnosis Date  . Anxiety   . CAD (coronary artery disease)   . Chronic back pain   . Colon polyps   . Diabetes mellitus   . Heart attack ([place])   . Hyperlipidemia   . Hypertension   . Panic attacks   . PVD (peripheral vascular disease) [facility])     Patient Active Problem List   Diagnosis Date Noted  . Superficial bacterial skin infection 06/16/2018  . Bacterial skin infection 06/16/2018  . CAD (coronary artery disease) 08/29/2011  . Tobacco abuse 08/29/2011  . Hypertension 08/29/2011  . Hyperlipidemia 08/29/2011  . Diabetes mellitus ([place]) 08/29/2011    Past Surgical History:  Procedure Laterality Date  . CORONARY ANGIOPLASTY WITH STENT PLACEMENT    . EAR CYST EXCISION  12/10/2011   Procedure: CYST REMOVAL;  Surgeon: Gae Bon, DDS;  Location: [facility];  Service: Oral Surgery;  Laterality: Left;  Left Mandible Cyst Removal  . MULTIPLE EXTRACTIONS WITH ALVEOLOPLASTY  12/10/2011   Procedure: MULTIPLE EXTRACION WITH ALVEOLOPLASTY;  Surgeon: Gae Bon, DDS;  Location: [facility];  Service: Oral  Surgery;  Laterality: N/A;  Right Maxillary Tuberosity Reduction; Right  Maxillary Buccal Exostosis; Bilateral Mandibular Tori   . PERIPHERAL VASCULAR CATHETERIZATION N/A 12/28/2014   Procedure: Abdominal Aortogram;  Surgeon: Serafina Mitchell, MD;  Location: [facility] CV LAB;  Service: Cardiovascular;  Laterality: N/A;  . PERIPHERAL VASCULAR CATHETERIZATION N/A 12/20/2015   Procedure: Abdominal Aortogram;  Surgeon: Serafina Mitchell, MD;  Location: [facility] CV LAB;  Service: Cardiovascular;  Laterality: N/A;  . PERIPHERAL VASCULAR CATHETERIZATION  12/20/2015   Procedure: Peripheral Vascular Balloon Angioplasty;  Surgeon: Serafina Mitchell, MD;  Location: [facility] CV LAB;  Service: Cardiovascular;;       Family History  Problem Relation Age of Onset  . Diabetes Father   . Kidney failure Father   . Hypertension Father   . Hyperlipidemia Father   . Heart disease Father     Social History   Tobacco Use  . Smoking status: Current Every Day Smoker    Packs/day: 0.25    Years: 25.00    Pack years: 6.25    Types: Cigarettes  . Smokeless tobacco: Never Used  . Tobacco comment: 1/4 pack per day  Vaping Use  . Vaping Use: Never used  Substance Use Topics  . Alcohol use: No    Alcohol/week: 0.0 standard drinks  . Drug use: No    Home Medications Prior to Admission medications   Medication Sig Start Date End Date Taking? Authorizing Provider  acetaminophen (TYLENOL) 500 MG tablet Take 1,000 mg by mouth every 6 (six) hours as needed for headache.    [provider]  aspirin EC 81 MG tablet Take 81 mg by mouth daily.     [provider]  carvedilol (COREG) 25 MG tablet Take 25 mg by mouth 2 (two) times daily with a meal.    [provider]  empagliflozin (JARDIANCE) 25 MG TABS tablet Take 25 mg by mouth daily.    [provider]  hydrOXYzine (ATARAX/VISTARIL) 25 MG tablet Take 1 tablet (25 mg total) by mouth every 6 (six) hours as needed for itching.  06/20/18   Kayleen Memos, DO  ibuprofen (ADVIL,MOTRIN) 200 MG tablet Take 400 mg by mouth every 6 (six) hours as needed for moderate pain.    [provider]  metFORMIN (GLUCOPHAGE) 1000 MG tablet Take 1,000 mg by mouth 2 (two) times daily with a meal.     [provider]  rosuvastatin (CRESTOR) 10 MG tablet Take 10 mg by mouth daily. 02/23/17   [provider]  Skin Protectants, Misc. (EUCERIN) cream Apply topically daily. 06/19/18   Kayleen Memos, DO  triamcinolone cream (KENALOG) 0.1 % Apply 1 application topically 2 (two) times daily. Patient taking differently: Apply 1 application topically 2 (two) times daily as needed (rash).  04/06/17   Virgel Manifold, MD    Allergies    Codeine, Gabapentin, Pregabalin, Simvastatin, Hydrocodone, Lisinopril, and Venlafaxine  Review of Systems   Review of Systems  Constitutional: Negative for chills and fever.  HENT: Negative.   Respiratory: Positive for chest tightness and shortness of breath.   Cardiovascular: Positive for leg swelling.  Gastrointestinal: Negative.  Negative for abdominal pain, diarrhea and vomiting.  Musculoskeletal: Positive for back pain (Chronic).  Skin: Negative.   Neurological: Negative.     Physical Exam Updated Vital Signs BP (!) 154/85 (BP Location: Left Arm)   Pulse (!) 56   Temp 98.8 F (37.1 C) (Oral)   Resp 18   Ht 5\' 7"  (1.702 m)   Wt 85 kg   SpO2 92%   BMI 29.35 kg/m   Physical Exam Vitals and nursing note reviewed.  Constitutional:      Appearance: He is well-developed.  HENT:     Head: Normocephalic.  Cardiovascular:     Rate and Rhythm: Normal rate and regular rhythm.     Heart sounds: No murmur heard.   Pulmonary:     Effort: Pulmonary effort is normal.     Breath sounds: Rales present.  Chest:     Chest wall: No tenderness.  Abdominal:     General: Bowel sounds are normal.     Palpations: Abdomen is soft.     Tenderness: There is no abdominal tenderness.  There is no guarding or rebound.  Musculoskeletal:        General: Normal range of motion.     Cervical back: Normal range of motion and neck supple.     Right lower leg: Edema present.     Left lower leg: Edema present.     Comments: Marked edema of legs, groin and abdomen.   Skin:    General: Skin is warm and dry.     Findings: No rash.  Neurological:     Mental Status: He is alert and oriented to person, place, and time.     ED Results / Procedures / Treatments   Labs (all labs ordered are listed, but only abnormal results  are displayed) Labs Reviewed  BASIC METABOLIC PANEL - Abnormal; Notable for the following components:      Result Value   Glucose, Bld 198 (*)    BUN 22 (*)    Creatinine, Ser 1.33 (*)    Calcium 8.7 (*)    GFR calc non Af Amer 59 (*)    All other components within normal limits  CBC - Abnormal; Notable for the following components:   RBC 5.94 (*)    Hemoglobin 17.1 (*)    HCT 53.9 (*)    All other components within normal limits  BRAIN NATRIURETIC PEPTIDE - Abnormal; Notable for the following components:   B Natriuretic Peptide 1,913.6 (*)    All other components within normal limits  TROPONIN I (HIGH SENSITIVITY) - Abnormal; Notable for the following components:   Troponin I (High Sensitivity) 19 (*)    All other components within normal limits  TROPONIN I (HIGH SENSITIVITY)    EKG EKG Interpretation  Date/Time:  Saturday October 24 2019 20:13:39 EDT Ventricular Rate:  53 PR Interval:  220 QRS Duration: 94 QT Interval:  476 QTC Calculation: 446 R Axis:   -99 Text Interpretation: Sinus bradycardia with 1st degree A-V block Low voltage QRS Inferior infarct , age undetermined Anterolateral infarct , age undetermined Abnormal ECG When compared with ECG of EARLIER SAME DATE No significant change was found Confirmed by Delora Fuel ([id number]) on 10/24/2019 9:17:57 PM   Radiology DG Chest 2 View  Result Date: 10/24/2019 CLINICAL DATA:  Shortness of  breath. Low back pain. History of CHF. EXAM: CHEST - 2 VIEW COMPARISON:  06/16/2013 FINDINGS: Moderate right pleural effusion. Associated right basilar opacity. Cardiomegaly with unchanged mediastinal contours. Aortic atherosclerosis. Increased interstitial opacities with Kerley B-lines consistent with pulmonary edema. No pneumothorax. No acute osseous abnormalities are seen. IMPRESSION: 1. Moderate right pleural effusion with associated right basilar opacity, likely atelectasis. 2. Cardiomegaly with pulmonary edema. Findings consistent with fluid overload/CHF. Electronically Signed   By: Keith Rake M.D.   On: 10/24/2019 20:34    Procedures Procedures (including critical care time)  Medications Ordered in ED Medications  sodium chloride flush (NS) 0.9 % injection 3 mL (has no administration in time range)  furosemide (LASIX) injection 20 mg (has no administration in time range)    ED Course  I have reviewed the triage vital signs and the nursing notes.  Pertinent labs & imaging results that were available during my care of the patient were reviewed by me and considered in my medical decision making (see chart for details).    MDM Rules/Calculators/A&P                          Patient to ED with progressive LE edema x 1 month, SOB/DOE and orthopnea.   VSS. He is found to have edema on CXR c/w CHF,, BNP >1900.  IV started, Lasix ordered. Anticipate admission for new onset CHF.  Discussed admission with [facility] admitting resident as unassigned consult, who accepts the patient to their service.   Final Clinical Impression(s) / ED Diagnoses Final diagnoses:  None   1. New onset CHF 2. Anasarca   Rx / DC Orders ED Discharge Orders    None       Charlann Lange, PA-C 10/25/19 4503    Ward, Delice Bison, DO 10/25/19 [phone]

## 2019-10-25 NOTE — Progress Notes (Signed)
Admission assessment and history completed.  See chart.  Report given at the bedside.  BP elevated upon arrival to unit.  After looking at BP in ER, patient's BP was higher before transporting patient to floor.  Notified doctor but no new orders received.   Educated patient about plan of care and how to use call bell.

## 2019-10-25 NOTE — Progress Notes (Signed)
   Subjective: HD#0   Overnight: No acute events reported. Patient was admitted  Today, [name] states that his shortness of breath and chest pain has significantly improved. His only complaint is that he feels tired which has been going on prior to his admission and he is looking forward to resting in the hospital. He is unsure if his lower extremity edema has improved. He continues to make urine and has failed the bedside urinal about 3 times since being admitted to the floor. Reports that his baseline weight is between 155 pounds 288 pounds.  Objective:  Vital signs in last 24 hours: Vitals:   10/25/19 0119 10/25/19 0126 10/25/19 0341 10/25/19 0405  BP:  (!) 174/93 (!) 175/111 (!) 171/104  Pulse:  70 74 71  Resp:  19 17 20   Temp:    97.6 F (36.4 C)  TempSrc:   Oral Oral  SpO2: 99% 98% 95% 93%  Weight:    98.9 kg  Height:    5\' 7"  (1.702 m)   Const: In no apparent distress, lying comfortably in bed, conversational HEENT: Nasal cannula in place Resp: Minimal crackles appreciated throughout the lung field CV: RRR, no murmurs, gallop, rub Ext: 2+ pitting edema bilaterally at the lower extremities with stasis dermatitis   Assessment/Plan:  Active Problems:   Acute systolic heart failure (HCC)  [name] is a 59 year old man with a past medical history of CAD and PAD (s/p stents), T2DM, HTN, tobacco use, OSA (on CPAP) who presented to [facility] on 7/4 with bilateral lower extremity swelling, shortness of breath, found to have acute on chronic CHF exacerbation.  #Acute on Chronic systolic heart failure exacerbation He is he is net -0.7 L since admitted to the hospital though unclear if this is accurate. His weight this morning is 218 pounds. He states that his dry weight is around 185-188 pounds which is about 30 pounds over his dry weight. PLAN: -Increase IV Lasix to 80 mg twice daily -Daily weights -Is and Os -Low salt diet -Monitor and replete  electrolytes -Telemetry - O2  #AKI versus acute on chronic kidney disease: Serum creatinine at admission 1.3 (baseline 0.8 in February 2020). Likely due to renal congestion -Continue to monitor  #Type 2 diabetes mellitus PLAN: -Sliding scale insulin -Continue jardiance 25mg  daily  #CAD #Hypertension PLAN: -Continue aspirin 81mg  -Continue rosuvastatin 10mg  -Resume Coreg at a lower dose of 12.5 mg twice daily  #OSA PLAN: -Continue home CPAP  FEN: Heart healthy diet VTE ppx: Subcutaneous Lovenox CODE STATUS: Full code  Prior to Admission Living Arrangement: Home Anticipated Discharge Location: Home Barriers to Discharge: IV diuretics Dispo: Anticipated discharge in approximately 2-3 day(s).    Jean Rosenthal, MD 10/25/2019, 6:07 AM Pager: [phone] Internal Medicine Teaching Service After 5pm on weekdays and 1pm on weekends: On Call pager: [phone]

## 2019-10-25 NOTE — Progress Notes (Signed)
Patient refusing CPAP for the night. 

## 2019-10-25 NOTE — H&P (Signed)
Date: 10/25/2019               Patient Name:  [name] MRN: [id number]  DOB: [birth year] Age / Sex: 59 y.o., male   PCP: Concepcion Elk, MD         Medical Service: Internal Medicine Teaching Service         Attending Physician: Dr. Velna Ochs, MD    First Contact: Dr. Gaylan Gerold Pager: [phone]  Second Contact: Dr. Nathanial Rancher Pager: [phone]       After Hours (After 5p/  First Contact Pager: [phone]  weekends / holidays): Second Contact Pager: [phone]   Chief Complaint: Leg Swelling  History of Present Illness:  Mr.Staib is a 59 year old man with a past medical history of CAD and PAD (s/p stents), T2DM, HTN, tobacco use, OSA (on CPAP) who presented to [facility] on 7/4 with bilateral lower extremity swelling. He states that approximately one month ago he drove from [place] to visit his daughter in [place]. Upon arrival to his daughter's house, he noticed that he had swelling in both of his legs which he described as uncomfortable. The swelling has since gradually worsened and has progressed upwards to involve his scrotum and abdomen. The swelling has become painful especially in his scrotum. He reports no significant changes to his diet during this time, however his diet largely consists of fried foods. Additionally, he reports the development of difficulty breathing and nonproductive cough during this time. He sleeps inclined in bed at home. He has a CPAP, but does not use it frequently. He reports that he has not experienced any of these symptoms in the past. He mentions that he was previously on diuretics until he was started on empagliflozin and his loop diuretics were stopped.   On review of systems, he mentions having a history of intermittent chest pain brought on by stress, as well as neuropathy which he attributes to his diabetes.  In the ED, he was found to have elevated BNP with pulmonary edema and right pleural effusion. He was provided IV furosemide 20mg   and put on nasal cannula oxygen. IMTS was consulted for admission.  Meds: Current Meds  Medication Sig  . acetaminophen (TYLENOL) 500 MG tablet Take 1,000 mg by mouth every 6 (six) hours as needed for headache.  [name] Kitchen aspirin EC 81 MG tablet Take 81 mg by mouth daily.   . carvedilol (COREG) 25 MG tablet Take 25 mg by mouth 2 (two) times daily with a meal.  . empagliflozin (JARDIANCE) 25 MG TABS tablet Take 25 mg by mouth daily.  . hydrOXYzine (ATARAX/VISTARIL) 25 MG tablet Take 1 tablet (25 mg total) by mouth every 6 (six) hours as needed for itching.  [name] Kitchen ibuprofen (ADVIL,MOTRIN) 200 MG tablet Take 400 mg by mouth every 6 (six) hours as needed for moderate pain.  . metFORMIN (GLUCOPHAGE) 1000 MG tablet Take 1,000 mg by mouth 2 (two) times daily with a meal.   . Multiple Vitamins-Minerals (CENTRUM SILVER) tablet Take 1 tablet by mouth daily.  . rosuvastatin (CRESTOR) 10 MG tablet Take 10 mg by mouth daily.  [name] Kitchen triamcinolone cream (KENALOG) 0.1 % Apply 1 application topically 2 (two) times daily. (Patient taking differently: Apply 1 application topically 2 (two) times daily as needed (rash). )  . TRULICITY 6.27 OJ/5.0KX SOPN Inject 0.75 mg into the skin once a week.   Allergies: Allergies as of 10/24/2019 - Review Complete 10/24/2019  Allergen Reaction Noted  . Codeine Shortness Of  Breath and Itching 11/21/2012  . Gabapentin Other (See Comments) 11/21/2012  . Pregabalin  02/25/2014  . Simvastatin  12/09/2012  . Hydrocodone Nausea Only and Rash 12/10/2011  . Lisinopril Itching and Rash 01/30/2011  . Venlafaxine Rash 02/25/2014   Past Medical History:  Diagnosis Date  . Anxiety   . CAD (coronary artery disease)   . Chronic back pain   . Colon polyps   . Diabetes mellitus   . Heart attack ([place])   . Hyperlipidemia   . Hypertension   . Panic attacks   . PVD (peripheral vascular disease) ([place])     Family History: Father had heart attack. No other significant family history  Social  History: Used to work as Acupuncturist. Currently living off disability. Lives with mother. Smokes around 3/4 pack daily for >30 years. Denies alcohol, illicit substance use.  Review of Systems: A complete ROS was negative except as per HPI.  Physical Exam: Blood pressure (!) 174/93, pulse 70, temperature 98.8 F (37.1 C), temperature source Oral, resp. rate 19, height 5\' 7"  (1.702 m), weight 96.6 kg, SpO2 98 %.  Physical Exam Constitutional:      Appearance: He is obese.  HENT:     Head: Normocephalic and atraumatic.  Eyes:     Extraocular Movements: Extraocular movements intact.     Pupils: Pupils are equal, round, and reactive to light.  Cardiovascular:     Rate and Rhythm: Normal rate and regular rhythm.  Pulmonary:     Effort: Respiratory distress present.     Breath sounds: Wheezing present.  Abdominal:     General: Bowel sounds are normal.     Palpations: Abdomen is soft.  Musculoskeletal:     Right lower leg: Edema present.     Left lower leg: Edema present.  Skin:    General: Skin is warm and dry.  Neurological:     Mental Status: He is alert.     EKG: personally reviewed my interpretation is sinus bradycardia. CXR: personally reviewed my interpretation is right-sided pleural effusion with increased pulmonary vasculature consistent with pulmonary edema.  Assessment & Plan by Problem: Active Problems:   Acute systolic heart failure (HCC)  Mr.Richeson is a 59 year old man with a past medical history of CAD and PAD (s/p stents), T2DM, HTN, tobacco use, OSA (on CPAP) who presented to [facility] on 7/4 with bilateral lower extremity swelling, shortness of breath, found to have acute on chronic CHF exacerbation.  #Acute on Chronic systolic heart failure exacerbation Patient presented with one month history of progressively worsening swelling in bilateral extremities, scrotum, and abdomen along with shortness of breath. Patient has cardiac history (EF 30% 2013), poor diet and a  long history of tobacco use. 2+ pitting edema to knees, scrotal swelling, and abdominal distention on physical exam. Labs significant for elevated BNP, 1913. Pleural effusion and pulmonary edema on CXR.  PLAN: -IV furosemide -Echocardiogram -Daily weights -Is and Os -Low salt diet -Monitor and replete electrolytes -Telemetry -[place] O2  #Type 2 diabetes mellitus PLAN: -Sliding scale insulin -Continue jardiance 25mg  daily  #CAD #Hypertension PLAN: -Continue aspirin 81mg  -Continue rosuvastatin 10mg  -Holding home carvedilol in the setting of bradycardia  #OSA PLAN: -Continue home CPAP  Dispo: Admit patient to Inpatient with expected length of stay greater than 2 midnights.  Signed: Paulla Dolly, MD 10/25/2019, 2:50 AM  Pager: @MYPAGER @ After 5pm on weekdays and 1pm on weekends: On Call pager: [phone]

## 2019-10-25 NOTE — ED Notes (Signed)
Attempted report x1. 

## 2019-10-25 NOTE — Progress Notes (Signed)
*  PRELIMINARY RESULTS* Echocardiogram 2D Echocardiogram has been performed.  [name] 10/25/2019, 3:26 PM

## 2019-10-26 ENCOUNTER — Inpatient Hospital Stay (HOSPITAL_COMMUNITY): Payer: Medicaid Other

## 2019-10-26 DIAGNOSIS — I236 Thrombosis of atrium, auricular appendage, and ventricle as current complications following acute myocardial infarction: Secondary | ICD-10-CM

## 2019-10-26 DIAGNOSIS — J90 Pleural effusion, not elsewhere classified: Secondary | ICD-10-CM | POA: Diagnosis present

## 2019-10-26 DIAGNOSIS — I5021 Acute systolic (congestive) heart failure: Secondary | ICD-10-CM

## 2019-10-26 LAB — ECHOCARDIOGRAM LIMITED
Height: 67 in
Weight: 3272 oz

## 2019-10-26 LAB — BASIC METABOLIC PANEL
Anion gap: 10 (ref 5–15)
BUN: 20 mg/dL (ref 6–20)
CO2: 27 mmol/L (ref 22–32)
Calcium: 8.6 mg/dL — ABNORMAL LOW (ref 8.9–10.3)
Chloride: 102 mmol/L (ref 98–111)
Creatinine, Ser: 1.25 mg/dL — ABNORMAL HIGH (ref 0.61–1.24)
GFR calc Af Amer: >60 mL/min (ref >60)
GFR calc non Af Amer: >60 mL/min (ref >60)
Glucose, Bld: 225 mg/dL — ABNORMAL HIGH (ref 70–99)
Potassium: 3.7 mmol/L (ref 3.5–5.1)
Sodium: 139 mmol/L (ref 135–145)

## 2019-10-26 LAB — GLUCOSE, CAPILLARY
Glucose-Capillary: 197 mg/dL — ABNORMAL HIGH (ref 70–99)
Glucose-Capillary: 236 mg/dL — ABNORMAL HIGH (ref 70–99)
Glucose-Capillary: 226 mg/dL — ABNORMAL HIGH (ref 70–99)
Glucose-Capillary: 129 mg/dL — ABNORMAL HIGH (ref 70–99)
Glucose-Capillary: 152 mg/dL — ABNORMAL HIGH (ref 70–99)

## 2019-10-26 LAB — HEPATIC FUNCTION PANEL
ALT: 26 U/L (ref 0–44)
AST: 27 U/L (ref 15–41)
Albumin: 3.2 g/dL — ABNORMAL LOW (ref 3.5–5.0)
Alkaline Phosphatase: 106 U/L (ref 38–126)
Bilirubin, Direct: 0.3 mg/dL — ABNORMAL HIGH (ref 0.0–0.2)
Indirect Bilirubin: 0.9 mg/dL (ref 0.3–0.9)
Total Bilirubin: 1.2 mg/dL (ref 0.3–1.2)
Total Protein: 6.1 g/dL — ABNORMAL LOW (ref 6.5–8.1)

## 2019-10-26 LAB — MAGNESIUM: Magnesium: 2 mg/dL (ref 1.7–2.4)

## 2019-10-26 MED ORDER — INSULIN GLARGINE 100 UNIT/ML ~~LOC~~ SOLN
10.0000 [IU] | Freq: Every day | SUBCUTANEOUS | Status: DC
  Administered 2019-10-27 – 2019-10-28 (×3): 10 [IU] via SUBCUTANEOUS
  Filled 2019-10-26 (×4): qty 0.1

## 2019-10-26 MED ORDER — POTASSIUM CHLORIDE CRYS ER 20 MEQ PO TBCR
30.0000 meq | EXTENDED_RELEASE_TABLET | Freq: Two times a day (BID) | ORAL | Status: DC
  Administered 2019-10-26 – 2019-10-27 (×3): 30 meq via ORAL
  Filled 2019-10-26 (×3): qty 1

## 2019-10-26 MED ORDER — SACUBITRIL-VALSARTAN 49-51 MG PO TABS
1.0000 | ORAL_TABLET | Freq: Two times a day (BID) | ORAL | Status: DC
  Administered 2019-10-27 – 2019-10-28 (×4): 1 via ORAL
  Filled 2019-10-26 (×5): qty 1

## 2019-10-26 MED ORDER — PERFLUTREN LIPID MICROSPHERE
1.0000 mL | INTRAVENOUS | Status: AC | PRN
  Administered 2019-10-26: 2 mL via INTRAVENOUS
  Filled 2019-10-26: qty 10

## 2019-10-26 NOTE — Consult Note (Addendum)
Cardiology Consultation:   Patient ID: [name] MRN: [id number]; DOB: [birth year]  Admit date: 10/24/2019 Date of Consult: 10/26/2019  Primary Care Provider: Concepcion Elk, MD [facility] Cardiologist: Kirk Ruths, MD [facility] Electrophysiologist:  None    Patient Profile:   [name] is a 59 y.o. male with a hx of CAD, (acute MI 2005 with PTCA to Diag, then stent to LCX in 2007 , last cath 2008 with 20% LM, 50% LAD and 70%mLAD, ? Hx of EF 29% on myoview in 2013 but echo in 2013 with normal EF, HTN, DM-2, HLD, + tobacco, chronic pain , PAD  who is being seen today for the evaluation of cardiomyopathy at the request of Dr. Rebeca Alert.  History of Present Illness:   Mr. Doo with above hx last seen by Dr. Stanford Breed in 2015 and followed ab [facility] by IM plans have been for sleep apnea.  + tobacco as <1ppd.  Hx of revascularization of Rt leg in 2017 with PTCA stent to Rt popliteal artery 2017.  Presemted to ER 10/24/19 with lower ext edema and into scrotum.  Some SOB and occ chest discomfort.  Pt was admitted and echo done yesterday with EF 30-35%, moderate LVH, G3 DD may need definity to more effectively exclude apical thrombus. acoustic shadowing and false  tendon noted. RV function is mildly reduced. Moderately elevated pulmonary artery pressure. bilateral atrium mild to moderately dilated.  The edema began 2 weeks ago and has increased.  Unable to rest.   Before admit  Labs hs troponin 17, 19,  bnp 1913  Na 139, Cr 1.25, K+ 3.7  hgb A1c of 10.6 hgb 17.1, wbc 6.6 plts 222   EKG:  The EKG was personally reviewed and demonstrates:  SB at 7, with Q waves inf and ant. But no change from 2017  Telemetry:  Telemetry was personally reviewed and demonstrates:  SR  2V CXR  IMPRESSION: 1. Moderate right pleural effusion with associated right basilar opacity, likely atelectasis. 2. Cardiomegaly with pulmonary edema. Findings consistent with  fluid overload/CHF.   BP 162/80, p 86 to 51  Negative 8260 and wt down from 98.9 to 92.8 kg on lasix 80 mg BID IV and has rec'd 120 mg yesterday morning in divided doses then by evening placed on 80 IV BID  Feeling much better.  No chest pain and SOB improved can lay almost flat.   Past Medical History:  Diagnosis Date  . Anxiety   . CAD (coronary artery disease)   . Chronic back pain   . Colon polyps   . Diabetes mellitus   . Heart attack ([place])   . Hyperlipidemia   . Hypertension   . Panic attacks   . PVD (peripheral vascular disease) ([place])     Past Surgical History:  Procedure Laterality Date  . CORONARY ANGIOPLASTY WITH STENT PLACEMENT    . EAR CYST EXCISION  12/10/2011   Procedure: CYST REMOVAL;  Surgeon: Gae Bon, DDS;  Location: [place];  Service: Oral Surgery;  Laterality: Left;  Left Mandible Cyst Removal  . MULTIPLE EXTRACTIONS WITH ALVEOLOPLASTY  12/10/2011   Procedure: MULTIPLE EXTRACION WITH ALVEOLOPLASTY;  Surgeon: Gae Bon, DDS;  Location: [place];  Service: Oral Surgery;  Laterality: N/A;  Right Maxillary Tuberosity Reduction; Right  Maxillary Buccal Exostosis; Bilateral Mandibular Tori   . PERIPHERAL VASCULAR CATHETERIZATION N/A 12/28/2014   Procedure: Abdominal Aortogram;  Surgeon: Serafina Mitchell, MD;  Location: [place] CV LAB;  Service:  Cardiovascular;  Laterality: N/A;  . PERIPHERAL VASCULAR CATHETERIZATION N/A 12/20/2015   Procedure: Abdominal Aortogram;  Surgeon: Serafina Mitchell, MD;  Location: [place] [facility];  Service: Cardiovascular;  Laterality: N/A;  . PERIPHERAL VASCULAR CATHETERIZATION  12/20/2015   Procedure: Peripheral Vascular Balloon Angioplasty;  Surgeon: Serafina Mitchell, MD;  Location: [place] [facility];  Service: Cardiovascular;;     Home Medications:  Prior to Admission medications   Medication Sig Start Date End Date Taking? Authorizing Provider  acetaminophen (TYLENOL) 500 MG tablet Take 1,000 mg by mouth every 6 (six) hours  as needed for headache.   Yes [provider]  aspirin EC 81 MG tablet Take 81 mg by mouth daily.    Yes [provider]  carvedilol (COREG) 25 MG tablet Take 25 mg by mouth 2 (two) times daily with a meal.   Yes [provider]  empagliflozin (JARDIANCE) 25 MG TABS tablet Take 25 mg by mouth daily.   Yes [provider]  hydrOXYzine (ATARAX/VISTARIL) 25 MG tablet Take 1 tablet (25 mg total) by mouth every 6 (six) hours as needed for itching. 06/20/18  Yes Kayleen Memos, DO  ibuprofen (ADVIL,MOTRIN) 200 MG tablet Take 400 mg by mouth every 6 (six) hours as needed for moderate pain.   Yes [provider]  metFORMIN (GLUCOPHAGE) 1000 MG tablet Take 1,000 mg by mouth 2 (two) times daily with a meal.    Yes [provider]  Multiple Vitamins-Minerals (CENTRUM SILVER) tablet Take 1 tablet by mouth daily.   Yes [provider]  rosuvastatin (CRESTOR) 10 MG tablet Take 10 mg by mouth daily. 02/23/17  Yes [provider]  triamcinolone cream (KENALOG) 0.1 % Apply 1 application topically 2 (two) times daily. Patient taking differently: Apply 1 application topically 2 (two) times daily as needed (rash).  04/06/17  Yes Virgel Manifold, MD  TRULICITY 7.06 CB/7.6EG SOPN Inject 0.75 mg into the skin once a week. 05/04/19  Yes [provider]  Skin Protectants, Misc. (EUCERIN) cream Apply topically daily. Patient taking differently: Apply 1 application topically daily as needed for dry skin.  06/19/18   Kayleen Memos, DO    Inpatient Medications: Scheduled Meds: . aspirin EC  81 mg Oral Daily  . atorvastatin  20 mg Oral Daily  . carvedilol  12.5 mg Oral BID WC  . empagliflozin  25 mg Oral Daily  . enoxaparin (LOVENOX) injection  40 mg Subcutaneous Q24H  . furosemide  80 mg Intravenous BID  . insulin aspart  0-20 Units Subcutaneous TID WC  . lidocaine  2 patch Transdermal Q24H  . losartan  50 mg Oral Daily  . nicotine  21 mg  Transdermal Q0600  . potassium chloride  30 mEq Oral BID  . sodium chloride flush  3 mL Intravenous Q12H   Continuous Infusions: . sodium chloride     PRN Meds: sodium chloride, acetaminophen, ipratropium-albuterol, ondansetron (ZOFRAN) IV, sodium chloride flush  Allergies:    Allergies  Allergen Reactions  . Codeine Shortness Of Breath and Itching  . Gabapentin Other (See Comments)    Suicidal thoughts, extreme dreams  . Pregabalin     Suicidal thoughts,extreme dreams   . Simvastatin     Pain in joints  . Hydrocodone Nausea Only and Rash    Breathing problems   . Lisinopril Itching and Rash  . Venlafaxine Rash    Social History:   Social History   Socioeconomic History  . Marital status:  Legally Separated    Spouse name: Not on file  . Number of children: 3  . Years of education: Not on file  . Highest education level: Not on file  Occupational History    Comment: Unemployed  Tobacco Use  . Smoking status: Current Every Day Smoker    Packs/day: 0.25    Years: 25.00    Pack years: 6.25    Types: Cigarettes  . Smokeless tobacco: Never Used  . Tobacco comment: 1/4 pack per day  Vaping Use  . Vaping Use: Never used  Substance and Sexual Activity  . Alcohol use: No    Alcohol/week: 0.0 standard drinks  . Drug use: No  . Sexual activity: Not on file  Other Topics Concern  . Not on file  Social History Narrative  . Not on file   Social Determinants of Health   Financial Resource Strain:   . Difficulty of Paying Living Expenses:   Food Insecurity:   . Worried About Charity fundraiser in the Last Year:   . Arboriculturist in the Last Year:   Transportation Needs:   . Film/video editor (Medical):   [name] Kitchen Lack of Transportation (Non-Medical):   Physical Activity:   . Days of Exercise per Week:   . Minutes of Exercise per Session:   Stress:   . Feeling of Stress :   Social Connections:   . Frequency of Communication with Friends and Family:   .  Frequency of Social Gatherings with Friends and Family:   . Attends Religious Services:   . Active Member of Clubs or Organizations:   . Attends Archivist Meetings:   [name] Kitchen Marital Status:   Intimate Partner Violence:   . Fear of Current or Ex-Partner:   . Emotionally Abused:   [name] Kitchen Physically Abused:   . Sexually Abused:     Family History:    Family History  Problem Relation Age of Onset  . Diabetes Father   . Kidney failure Father   . Hypertension Father   . Hyperlipidemia Father   . Heart disease Father      ROS:  Please see the history of present illness.  General:no colds or fevers, + weight changes Skin:no rashes or ulcers HEENT:no blurred vision, no congestion CV:see HPI PUL:see HPI GI:no diarrhea constipation or melena, no indigestion GU:no hematuria, no dysuria MS:no joint pain, no claudication Neuro:no syncope, no lightheadedness Endo:+ diabetes, no thyroid disease  All other ROS reviewed and negative.     Physical Exam/Data:   Vitals:   10/25/19 1958 10/26/19 0012 10/26/19 0438 10/26/19 0833  BP: 126/63 (!) 147/77 (!) 179/86 (!) 162/80  Pulse: (!) 55 (!) 53 (!) 55 76  Resp: 20 18 20 20   Temp: 98 F (36.7 C) (!) 97.4 F (36.3 C) 97.6 F (36.4 C)   TempSrc: Oral Oral Oral   SpO2: 93% 97% 94%   Weight:  92.8 kg    Height:        Intake/Output Summary (Last 24 hours) at 10/26/2019 1056 Last data filed at 10/26/2019 0900 Gross per 24 hour  Intake 920 ml  Output 5560 ml  Net -4640 ml   Last 3 Weights 10/26/2019 10/25/2019 10/25/2019  Weight (lbs) 204 lb 8 oz 218 lb 1.6 oz 213 lb  Weight (kg) 92.761 kg 98.93 kg 96.616 kg     Body mass index is 32.03 kg/m.  General:  Well nourished, well developed, in no acute distress HEENT: normal Lymph:  no adenopathy Neck: + JVD Endocrine:  No thryomegaly Vascular: No carotid bruits; pedal pulses 2+ bilaterally  Cardiac:  normal S1, S2; RRR; no murmur gallup rub or click Lungs:  Decreased breath sounds in  both bases to auscultation bilaterally, no wheezing, rhonchi or rales  Abd: soft, nontender, no hepatomegaly  Ext: improved edema Musculoskeletal:  No deformities, BUE and BLE strength normal and equal Skin: warm and dry  Neuro:  Alert and oriented X 3 MAE follows commands, no focal abnormalities noted Psych:  Normal affect   Relevant CV Studies: Cath 2008   IMPRESSION:  I will review the films with Dr. Burt Knack.  The LAD lesion  looks flow-limiting to me.  I suspect he would benefit from a non drug-  eluting stent to the mid-LAD.  He has been very noncompliant with his  medications as seen with his uncontrolled diabetes, continued smoking  and lack of Plavix therapy.  The stent in the circ was a non drug-  eluting stent.   I would probably leave the right coronary artery alone as it was  previously described as subtotally occluded and has reasonable  collaterals.  echo 10/25/19   1. Left ventricular ejection fraction, by estimation, is 30 to 35%. The  left ventricle has moderately decreased function. The left ventricle  demonstrates regional wall motion abnormalities (see scoring  diagram/findings for description). There is  moderate left ventricular hypertrophy. Left ventricular diastolic  parameters are consistent with Grade III diastolic dysfunction  (restrictive). Suggest Definity contrast study to more effectively exclude  apical thrombus - acoustic shadowing and false  tendon noted.  2. Right ventricular systolic function is mildly reduced. The right  ventricular size is normal. There is moderately elevated pulmonary artery  systolic pressure. The estimated right ventricular systolic pressure is  37.1 mmHg.  3. Left atrial size was mild to moderately dilated.  4. Right atrial size was mild to moderately dilated.  5. The mitral valve is abnormal, mildly thickened and with annular  calcification. Mild mitral valve regurgitation.  6. The tricuspid valve is abnormal.   7. The aortic valve is tricuspid. Aortic valve regurgitation is trivial.  8. The inferior vena cava is dilated in size with <50% respiratory  variability, suggesting right atrial pressure of 15 mmHg.   FINDINGS  Left Ventricle: Left ventricular ejection fraction, by estimation, is 30  to 35%. The left ventricle has moderately decreased function. The left  ventricle demonstrates regional wall motion abnormalities. The left  ventricular internal cavity size was  normal in size. There is moderate left ventricular hypertrophy. Left  ventricular diastolic parameters are consistent with Grade III diastolic  dysfunction (restrictive).     LV Wall Scoring:  The mid and distal anterior wall and entire apex are akinetic. The  inferior  wall, mid anterolateral segment, and basal anterior segment are  hypokinetic.  The basal anterolateral segment, mid inferoseptal segment, and basal  inferoseptal segment are normal.   Right Ventricle: The right ventricular size is normal. No increase in  right ventricular wall thickness. Right ventricular systolic function is  mildly reduced. There is moderately elevated pulmonary artery systolic  pressure. The tricuspid regurgitant  velocity is 2.90 m/s, and with an assumed right atrial pressure of 15  mmHg, the estimated right ventricular systolic pressure is 69.6 mmHg.   Left Atrium: Left atrial size was mild to moderately dilated.   Right Atrium: Right atrial size was mild to moderately dilated.   Pericardium: Trivial pericardial effusion is present.  The pericardial  effusion is posterior to the left ventricle.   Mitral Valve: The mitral valve is abnormal. There is mild thickening of  the mitral valve leaflet(s). Mild mitral annular calcification. Mild  mitral valve regurgitation.   Tricuspid Valve: The tricuspid valve is abnormal. Tricuspid valve  regurgitation is mild.   Aortic Valve: The aortic valve is tricuspid. Aortic valve  regurgitation is  trivial. Mild aortic valve annular calcification.   Pulmonic Valve: The pulmonic valve was grossly normal. Pulmonic valve  regurgitation is trivial.   Aorta: The aortic root is normal in size and structure.   Venous: The inferior vena cava is dilated in size with less than 50%  respiratory variability, suggesting right atrial pressure of 15 mmHg.   IAS/Shunts: No atrial level shunt detected by color flow Doppler.     LEFT VENTRICLE   Laboratory Data:  High Sensitivity Troponin:   Recent Labs  Lab 10/24/19 2011 10/24/19 2218  TROPONINIHS 17 19*     Chemistry Recent Labs  Lab 10/24/19 2011 10/25/19 1111 10/26/19 0142  NA 141 142 139  K 4.3 3.8 3.7  CL 105 104 102  CO2 25 28 27   GLUCOSE 198* 126* 225*  BUN 22* 19 20  CREATININE 1.33* 1.15 1.25*  CALCIUM 8.7* 9.1 8.6*  GFRNONAA 59* >60 >60  GFRAA >60 >60 >60  ANIONGAP 11 10 10     Recent Labs  Lab 10/26/19 0142  PROT 6.1*  ALBUMIN 3.2*  AST 27  ALT 26  ALKPHOS 106  BILITOT 1.2   Hematology Recent Labs  Lab 10/24/19 2011  WBC 6.6  RBC 5.94*  HGB 17.1*  HCT 53.9*  MCV 90.7  MCH 28.8  MCHC 31.7  RDW 15.4  PLT 222   BNP Recent Labs  Lab 10/24/19 2012  BNP 1,913.6*    DDimer No results for input(s): DDIMER in the last 168 hours.   Radiology/Studies:  DG Chest 2 View  Result Date: 10/24/2019 CLINICAL DATA:  Shortness of breath. Low back pain. History of CHF. EXAM: CHEST - 2 VIEW COMPARISON:  06/16/2013 FINDINGS: Moderate right pleural effusion. Associated right basilar opacity. Cardiomegaly with unchanged mediastinal contours. Aortic atherosclerosis. Increased interstitial opacities with Kerley B-lines consistent with pulmonary edema. No pneumothorax. No acute osseous abnormalities are seen. IMPRESSION: 1. Moderate right pleural effusion with associated right basilar opacity, likely atelectasis. 2. Cardiomegaly with pulmonary edema. Findings consistent with fluid overload/CHF.  Electronically Signed   By: Keith Rake M.D.   On: 10/24/2019 20:34   ECHOCARDIOGRAM COMPLETE  Result Date: 10/25/2019    ECHOCARDIOGRAM REPORT   Patient Name:   [name] Date of Exam: 10/25/2019 Medical Rec #:  [id number]         Height:       67.0 in Accession #:    [id number]        Weight:       218.1 lb Date of Birth:  Jul 03, 1960         BSA:          2.098 m Patient Age:    52 years          BP:           169/93 mmHg Patient Gender: M                 HR:           56 bpm. Exam Location:  Inpatient Procedure: 2D Echo, Cardiac Doppler and Color Doppler Indications:  CHF-Acute Systolic 245.80 / D98.33  History:        Patient has prior history of Echocardiogram examinations, most                 recent 09/18/2011. CAD; Risk Factors:Hypertension, Diabetes and                 Dyslipidemia. Tobacco abuse,Heart attack ([place]) (From Hx).  Sonographer:    Alvino Chapel RCS Referring Phys: [place]  1. Left ventricular ejection fraction, by estimation, is 30 to 35%. The left ventricle has moderately decreased function. The left ventricle demonstrates regional wall motion abnormalities (see scoring diagram/findings for description). There is moderate left ventricular hypertrophy. Left ventricular diastolic parameters are consistent with Grade III diastolic dysfunction (restrictive). Suggest Definity contrast study to more effectively exclude apical thrombus - acoustic shadowing and false tendon noted.  2. Right ventricular systolic function is mildly reduced. The right ventricular size is normal. There is moderately elevated pulmonary artery systolic pressure. The estimated right ventricular systolic pressure is 82.5 mmHg.  3. Left atrial size was mild to moderately dilated.  4. Right atrial size was mild to moderately dilated.  5. The mitral valve is abnormal, mildly thickened and with annular calcification. Mild mitral valve regurgitation.  6. The tricuspid valve is abnormal.  7. The  aortic valve is tricuspid. Aortic valve regurgitation is trivial.  8. The inferior vena cava is dilated in size with <50% respiratory variability, suggesting right atrial pressure of 15 mmHg. FINDINGS  Left Ventricle: Left ventricular ejection fraction, by estimation, is 30 to 35%. The left ventricle has moderately decreased function. The left ventricle demonstrates regional wall motion abnormalities. The left ventricular internal cavity size was normal in size. There is moderate left ventricular hypertrophy. Left ventricular diastolic parameters are consistent with Grade III diastolic dysfunction (restrictive).  LV Wall Scoring: The mid and distal anterior wall and entire apex are akinetic. The inferior wall, mid anterolateral segment, and basal anterior segment are hypokinetic. The basal anterolateral segment, mid inferoseptal segment, and basal inferoseptal segment are normal. Right Ventricle: The right ventricular size is normal. No increase in right ventricular wall thickness. Right ventricular systolic function is mildly reduced. There is moderately elevated pulmonary artery systolic pressure. The tricuspid regurgitant velocity is 2.90 m/s, and with an assumed right atrial pressure of 15 mmHg, the estimated right ventricular systolic pressure is 05.3 mmHg. Left Atrium: Left atrial size was mild to moderately dilated. Right Atrium: Right atrial size was mild to moderately dilated. Pericardium: Trivial pericardial effusion is present. The pericardial effusion is posterior to the left ventricle. Mitral Valve: The mitral valve is abnormal. There is mild thickening of the mitral valve leaflet(s). Mild mitral annular calcification. Mild mitral valve regurgitation. Tricuspid Valve: The tricuspid valve is abnormal. Tricuspid valve regurgitation is mild. Aortic Valve: The aortic valve is tricuspid. Aortic valve regurgitation is trivial. Mild aortic valve annular calcification. Pulmonic Valve: The pulmonic valve was  grossly normal. Pulmonic valve regurgitation is trivial. Aorta: The aortic root is normal in size and structure. Venous: The inferior vena cava is dilated in size with less than 50% respiratory variability, suggesting right atrial pressure of 15 mmHg. IAS/Shunts: No atrial level shunt detected by color flow Doppler.  LEFT VENTRICLE PLAX 2D LVIDd:         5.17 cm      Diastology LVIDs:         4.10 cm      LV e' lateral:  3.37 cm/s LV PW:         1.50 cm      LV E/e' lateral: 28.3 LV IVS:        1.60 cm      LV e' medial:    4.24 cm/s LVOT diam:     1.70 cm      LV E/e' medial:  22.5 LV SV:         48 LV SV Index:   23 LVOT Area:     2.27 cm  LV Volumes (MOD) LV vol d, MOD A2C: 117.0 ml LV vol d, MOD A4C: 121.0 ml LV vol s, MOD A2C: 71.3 ml LV vol s, MOD A4C: 68.8 ml LV SV MOD A2C:     45.7 ml LV SV MOD A4C:     121.0 ml LV SV MOD BP:      51.5 ml RIGHT VENTRICLE RV S prime:     8.81 cm/s TAPSE (M-mode): 1.5 cm LEFT ATRIUM             Index       RIGHT ATRIUM           Index LA diam:        4.10 cm 1.95 cm/m  RA Area:     27.70 cm LA Vol (A2C):   84.9 ml 40.47 ml/m RA Volume:   101.00 ml 48.14 ml/m LA Vol (A4C):   81.3 ml 38.75 ml/m LA Biplane Vol: 87.2 ml 41.56 ml/m  AORTIC VALVE LVOT Vmax:   99.20 cm/s LVOT Vmean:  63.800 cm/s LVOT VTI:    0.212 m  AORTA Ao Root diam: 3.50 cm MITRAL VALVE               TRICUSPID VALVE MV Area (PHT): 4.21 cm    TR Peak grad:   33.6 mmHg MV Decel Time: 180 msec    TR Vmax:        290.00 cm/s MV E velocity: 95.50 cm/s MV A velocity: 23.10 cm/s  SHUNTS MV E/A ratio:  4.13        Systemic VTI:  0.21 m                            Systemic Diam: 1.70 cm Rozann Lesches MD Electronically signed by Rozann Lesches MD Signature Date/Time: 10/25/2019/4:49:11 PM    Final         No chest pain  Assessment and Plan:   1. 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 NEW cardiomyopathy -- Agree to diuresis once fluid improved would consider changed losartan to entresto if  cost is not an issue - he is on coreg 12.5 BID  With know CAD he may need Rt and Lt cardiac cath.  Continue ASA.  Also with pl effusion if diuresis does not control may need thoracentesis    Also discussed decrease salt briefly in diet  2. CAD with PTCA after MI to Diag one then in 2007 stent to LCX.  Last cath stable but + CAD   3. HTN  elvated today on coreg and losartan plus lasix 4. Sinus brady to 51 monitor may need to back off of BB. 5. Tobacco use has nicoderm patch and has been asked to stop. We discussed  6.  DM-2 per IM on jardiance may need farxia         For questions or updates, please contact [place] Please consult  [email or web address] for contact info under    Signed, Cecilie Kicks, NP  10/26/2019 10:56 AM  Attending note  Patient seen and discussed with PA Dorene Ar, I agree with her documentaiton. 59 yo male histoyr of CAD, PAD, DM2, OSA on cpap admitted with leg, scrotal, and abdominal swelling.    K 4.3 Cr 1.33 BUN 22 WBC 6.6 Hgb 17.1 Plt 222 BNP 1913 Mg 2 Hgb A1c 10.6  hstrop 17-->19--> COVID neg CXR pulm edema EKG sinus brady, inferior, lateral, anterior Qwaves 09/2019 echo LVEF 30-35%, grade III DDx, dilated IVC   Patient presents with acute systolic HF. LVEF 60-63% by echo this admit, prior echo 2013 LVEF 30-35%. Negative 6 L yesterday, negative 8.2 L since admission. Received several doses of IV lasix yesterday (20,20,40,40,80). Currently on 80mg  IV bid today, f/u I/Os. If continued very aggressive diuresis may lower IV lasix dosing. Other medical therapy with coreg 12.5mg bid, losartan 50. Change losartan to entresto 49/51 mg  bid tomorrow (itching/rash on lisinopril, reaosnable to try entresto), if tolerates add aldactone. Given his history of PAD and CAD and diffuse Qwaves on EKG likely ICM as etiology. Would plan for RHC/LHC once more euvolemic. Would tolerate HRs 50 and above on coreg, follow rates.   Check lipid panel, pending results consider high dose statin  given his CAD/PAD history.    Has limited study with echocontrast pending to better exclude apical thrombus.  Carlyle Dolly MD

## 2019-10-26 NOTE — Progress Notes (Signed)
°  Echocardiogram 2D Echocardiogram limited with definity has been performed.  Darlina Sicilian M 10/26/2019, 1:12 PM

## 2019-10-26 NOTE — Progress Notes (Addendum)
Subjective: Patient examined at bedside this morning. His ex-wife is at bedside and is his care giver. He states he has been urinating often, and has had marked improvement in breathing and leg swelling. No longer having any orthopnea. Denies any chest pain.  We discussed his echo and cardiology consult. He was seeing Dr. Stanford Breed the cardiologist but lost to follow up since 2013 whom he followed after his MI.  We also discussed his pleural effusion and thoracentesis possibly tomorrow, patient agreeable. Patient requested switching to our clinic for primary care.  Objective:  Vital signs in last 24 hours: Vitals:   10/25/19 1639 10/25/19 1958 10/26/19 0012 10/26/19 0438  BP: 136/66 126/63 (!) 147/77 (!) 179/86  Pulse: (!) 53 (!) 55 (!) 53 (!) 55  Resp: 20 20 18 20   Temp: 97.9 F (36.6 C) 98 F (36.7 C) (!) 97.4 F (36.3 C) 97.6 F (36.4 C)  TempSrc: Oral Oral Oral Oral  SpO2: 94% 93% 97% 94%  Weight:   92.8 kg   Height:       Physical Exam Cardiovascular:     Rate and Rhythm: Normal rate and regular rhythm.     Heart sounds: Normal heart sounds.     Comments: JVP visible to mid neck Pulmonary:     Effort: No tachypnea or respiratory distress.     Breath sounds: Examination of the right-upper field reveals wheezing. Examination of the left-upper field reveals wheezing. Examination of the right-lower field reveals decreased breath sounds. Decreased breath sounds and wheezing present.  Chest:     Chest wall: There is dullness to percussion.     Comments: Dullness to percussion of RLL Abdominal:     Comments: Mildly distended  Musculoskeletal:     Right lower leg: Edema present.     Left lower leg: Edema present.     Comments: +1 bilateral extremity edema  Skin:    General: Skin is warm.  Neurological:     Mental Status: He is alert.  Psychiatric:        Mood and Affect: Mood normal.    Assessment/Plan:  59 yo male with PMH of CAD s/p stents, type 2 DM, OSA on CPAP,  and HTN, admitted to the hospital for bilateral extremity edema and dyspnea, found to have acute heart failure with reduced EF.   Principal Problem:   Acute congestive heart failure (HCC) Active Problems:   CAD (coronary artery disease)   Tobacco abuse   Hypertension   Hyperlipidemia   Diabetes mellitus (HCC)   Pleural effusion  Acute decompensated heart failure  - Echo: EF 30-35% with regional wall abnormality. Grade III diastolic dysfunction. R ventricular systolic function reduced. Patient has no diagnosis of heart failure in the past. Given h/o CAD and MI, likely ischemic in origin - Patient produced 7L of fluid in the last 24h. Wt drop 218-204 (dry wt 185) - Cardiology is on board with the treatment plan and recommends continue diuresis. Change Losartan to [facility] tomorrow. Will add Spironolactone later. RHC/LHC will be planned once patient is euvolemic given history of CAD.  - Continue Lasix 80 mg BID. Strict I/O. Daily wt - Continue Coreg 12.5 mg. Monitor HR, has has sinus bradycardia in the 50s with PR ~250 ms - Continue cardiac monitoring   Pleural Effusion  - CXR shows moderate right pleural effusion .  - Plan to possibly do a thoracentesis tomorrow.  - Continue diuresis with Lasix  AKI - Likely secondary to renal congestion  -  Baseline Creatine 0.8. Cr improves 1.25 - Continue diuresis  - Daily BMP   HTN - Continue Coreg 12.5 mg and Losartan 50 mg today - Entresto will be started tomorrow per Cardio  - Continue to monitor vitals   Type 2 DM  - A1C 10.6 - Continue Jardiance and Novolog SSC TID  - Add 10 units of Lantus daily at bedtime - Continue to monitor blood glucose    OSA - Continue CPAP  FEN: Heart healthy diet VTE ppx: Subcutaneous Lovenox IVF: none CODE STATUS: Full code  Prior to Admission Living Arrangement: home Anticipated Discharge Location: home Barriers to Discharge: hypervolemia, needs additional workup of HFrEF Dispo: Anticipated  discharge in approximately 2 day(s).   Gaylan Gerold, DO 10/26/2019, 7:35 AM Pager: [phone] After 5pm on weekdays and 1pm on weekends: On Call pager [phone]

## 2019-10-26 NOTE — Evaluation (Signed)
Physical Therapy Evaluation Patient Details Name: [name] MRN: [id number] DOB: March 02, 1961 Today's Date: 10/26/2019   History of Present Illness  Patient is a 59 y/o male who presents with BLE swelling and SOB. Elevated BNP. CXR- pulmonary edema, right pleural effusion. Admitted with Acute combined systolic and diastolic HF with NEW cardiomyopathy. Possible thoracentesis pending. PMH includes CAd, tobacco abuse, HTN, HLD, DM, PAD, panic attacks.  Clinical Impression  Patient presents with decreased activity tolerance, impaired endurance, dyspnea on exertion and impaired mobility s/p above. Pt lives with elderly mother and reports being independent for ADLs and IADLs PTA. Today, pt tolerated transfers and ambulation Mod I -supervision for safety with Sp02 dropping to 88% on RA. Able to recover to >90% with cues for pursed lip breathing. Donned supplemental 02 when back in room. Encouraged short bouts of activity and walking multiple times per day while in the hospital. Will follow acutely to maximize independence and mobility prior to return home.    Follow Up Recommendations No PT follow up;Supervision - Intermittent    Equipment Recommendations  None recommended by PT    Recommendations for Other Services       Precautions / Restrictions Precautions Precautions: Other (comment) Precaution Comments: watch 02 Restrictions Weight Bearing Restrictions: No      Mobility  Bed Mobility Overal bed mobility: Needs Assistance Bed Mobility: Supine to Sit;Sit to Supine     Supine to sit: Modified independent (Device/Increase time);HOB elevated Sit to supine: Modified independent (Device/Increase time);HOB elevated   General bed mobility comments: No assist needed.  Transfers Overall transfer level: Modified independent Equipment used: None             General transfer comment: Stood from EOB without difficulty.  Ambulation/Gait Ambulation/Gait assistance:  Supervision Gait Distance (Feet): 150 Feet Assistive device: None Gait Pattern/deviations: Step-through pattern;Decreased stride length;Drifts right/left Gait velocity: decreased Gait velocity interpretation: 1.31 - 2.62 ft/sec, indicative of limited community ambulator General Gait Details: Slow, mostly steady gait with some mild drifting (pt reports as baseline due to neuropathy). Sp02 dropped to 88% on RA, able to recover with cues for pursed lip breathing to >90%. 2/4 DOE.  Stairs            Wheelchair Mobility    Modified Rankin (Stroke Patients Only)       Balance Overall balance assessment: No apparent balance deficits (not formally assessed)                                           Pertinent Vitals/Pain Pain Assessment: Faces Faces Pain Scale: Hurts a little bit Pain Location: low back Pain Descriptors / Indicators: Aching Pain Intervention(s): Monitored during session;Repositioned    Home Living Family/patient expects to be discharged to:: Private residence Living Arrangements:  (elderly mother) Available Help at Discharge: Family;Available 24 hours/day Type of Home: House Home Access: Stairs to enter Entrance Stairs-Rails: Right Entrance Stairs-Number of Steps: 2 Home Layout: One level Home Equipment: Shower seat;Cane - quad;Walker - 2 wheels;Wheelchair - manual      Prior Function Level of Independence: Independent         Comments: Drives, cooks/cleans. Lives with elderly mother.     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 Swelling improving in LEs but still some in abdomen.    Exercises     Assessment/Plan    PT Assessment Patient needs continued PT services  PT Problem List Decreased mobility;Cardiopulmonary status limiting activity;Decreased activity tolerance;Decreased balance;Impaired sensation       PT Treatment Interventions Therapeutic activities;Therapeutic exercise;Patient/family education;Balance training;Stair training;Functional mobility training    PT Goals (Current goals can be found in the Care Plan section)  Acute Rehab PT Goals Patient Stated Goal: to get better PT Goal Formulation: With patient Time For Goal Achievement: 11/09/19 Potential to Achieve Goals: Good    Frequency Min 3X/week   Barriers to discharge        Co-evaluation               AM-PAC PT "6 Clicks" Mobility  Outcome Measure Help needed turning from your back to your side while in a flat bed without using bedrails?: None Help needed moving from lying on your back to sitting on the side of a flat bed without using bedrails?: None Help needed moving to and from a bed to a chair (including a wheelchair)?: None Help needed standing up from a chair using your arms (e.g., wheelchair or bedside chair)?: None Help needed to walk in hospital room?: None Help needed climbing 3-5 steps with a railing? : A Little 6 Click Score: 23    End of Session   Activity Tolerance: Treatment limited secondary to medical  complications (Comment) (drop in Sp02) Patient left: in bed;with call bell/phone within reach;with family/visitor present Nurse Communication: Mobility status PT Visit Diagnosis: Unsteadiness on feet (R26.81);Difficulty in walking, not elsewhere classified (R26.2)    Time: 5038-8828 PT Time Calculation (min) (ACUTE ONLY): 17 min   Charges:   PT Evaluation $PT Eval Moderate Complexity: 1 Mod          Marisa Severin, PT, DPT Acute Rehabilitation Services Pager [phone] Office [phone]      Marguarite Arbour A Sabra Heck 10/26/2019, 2:19 PM

## 2019-10-27 ENCOUNTER — Inpatient Hospital Stay (HOSPITAL_COMMUNITY): Payer: Medicaid Other

## 2019-10-27 DIAGNOSIS — E7800 Pure hypercholesterolemia, unspecified: Secondary | ICD-10-CM

## 2019-10-27 DIAGNOSIS — J90 Pleural effusion, not elsewhere classified: Secondary | ICD-10-CM

## 2019-10-27 DIAGNOSIS — I5043 Acute on chronic combined systolic (congestive) and diastolic (congestive) heart failure: Secondary | ICD-10-CM

## 2019-10-27 DIAGNOSIS — Z720 Tobacco use: Secondary | ICD-10-CM

## 2019-10-27 DIAGNOSIS — I2510 Atherosclerotic heart disease of native coronary artery without angina pectoris: Secondary | ICD-10-CM

## 2019-10-27 HISTORY — PX: IR THORACENTESIS ASP PLEURAL SPACE W/IMG GUIDE: IMG5380

## 2019-10-27 LAB — CBC
HCT: 56.2 % — ABNORMAL HIGH (ref 39.0–52.0)
Hemoglobin: 17.6 g/dL — ABNORMAL HIGH (ref 13.0–17.0)
MCH: 28.2 pg (ref 26.0–34.0)
MCHC: 31.3 g/dL (ref 30.0–36.0)
MCV: 90.1 fL (ref 80.0–100.0)
Platelets: 211 10*3/uL (ref 150–400)
RBC: 6.24 MIL/uL — ABNORMAL HIGH (ref 4.22–5.81)
RDW: 15.7 % — ABNORMAL HIGH (ref 11.5–15.5)
WBC: 7.3 10*3/uL (ref 4.0–10.5)
nRBC: 0 % (ref 0.0–0.2)

## 2019-10-27 LAB — BODY FLUID CELL COUNT WITH DIFFERENTIAL
Eos, Fluid: 0 %
Lymphs, Fluid: 63 %
Monocyte-Macrophage-Serous Fluid: 23 % — ABNORMAL LOW (ref 50–90)
Neutrophil Count, Fluid: 14 % (ref 0–25)
Total Nucleated Cell Count, Fluid: 577 cu mm (ref 0–1000)

## 2019-10-27 LAB — ALBUMIN, PLEURAL OR PERITONEAL FLUID: Albumin, Fluid: 1.3 g/dL

## 2019-10-27 LAB — GLUCOSE, CAPILLARY
Glucose-Capillary: 167 mg/dL — ABNORMAL HIGH (ref 70–99)
Glucose-Capillary: 222 mg/dL — ABNORMAL HIGH (ref 70–99)
Glucose-Capillary: 213 mg/dL — ABNORMAL HIGH (ref 70–99)
Glucose-Capillary: 242 mg/dL — ABNORMAL HIGH (ref 70–99)

## 2019-10-27 LAB — LACTATE DEHYDROGENASE, PLEURAL OR PERITONEAL FLUID: LD, Fluid: 56 U/L — ABNORMAL HIGH (ref 3–23)

## 2019-10-27 LAB — BASIC METABOLIC PANEL
Anion gap: 14 (ref 5–15)
BUN: 20 mg/dL (ref 6–20)
CO2: 25 mmol/L (ref 22–32)
Calcium: 9 mg/dL (ref 8.9–10.3)
Chloride: 101 mmol/L (ref 98–111)
Creatinine, Ser: 1.03 mg/dL (ref 0.61–1.24)
GFR calc Af Amer: >60 mL/min (ref >60)
GFR calc non Af Amer: >60 mL/min (ref >60)
Glucose, Bld: 160 mg/dL — ABNORMAL HIGH (ref 70–99)
Potassium: 4 mmol/L (ref 3.5–5.1)
Sodium: 140 mmol/L (ref 135–145)

## 2019-10-27 LAB — GRAM STAIN

## 2019-10-27 LAB — LIPID PANEL
Cholesterol: 135 mg/dL (ref 0–200)
HDL: 34 mg/dL — ABNORMAL LOW (ref >40)
LDL Cholesterol: 83 mg/dL (ref 0–99)
Total CHOL/HDL Ratio: 4 RATIO
Triglycerides: 89 mg/dL (ref <150)
VLDL: 18 mg/dL (ref 0–40)

## 2019-10-27 LAB — PROTEIN, PLEURAL OR PERITONEAL FLUID: Total protein, fluid: <3 g/dL

## 2019-10-27 LAB — PROTEIN, TOTAL: Total Protein: 6.4 g/dL — ABNORMAL LOW (ref 6.5–8.1)

## 2019-10-27 MED ORDER — ATORVASTATIN CALCIUM 40 MG PO TABS
40.0000 mg | ORAL_TABLET | Freq: Every day | ORAL | Status: DC
  Administered 2019-10-28 – 2019-11-06 (×9): 40 mg via ORAL
  Filled 2019-10-27 (×10): qty 1

## 2019-10-27 MED ORDER — CARVEDILOL 3.125 MG PO TABS
3.1250 mg | ORAL_TABLET | Freq: Two times a day (BID) | ORAL | Status: DC
  Administered 2019-10-27 – 2019-10-29 (×4): 3.125 mg via ORAL
  Filled 2019-10-27 (×4): qty 1

## 2019-10-27 MED ORDER — FUROSEMIDE 80 MG PO TABS: 80.0000 mg | ORAL_TABLET | Freq: Every day | ORAL | Status: DC

## 2019-10-27 MED ORDER — SODIUM CHLORIDE 0.9 % IV SOLN: INTRAVENOUS | Status: DC

## 2019-10-27 MED ORDER — LIDOCAINE HCL 1 % IJ SOLN
INTRAMUSCULAR | Status: AC
  Filled 2019-10-27: qty 20

## 2019-10-27 MED ORDER — SODIUM CHLORIDE 0.9% FLUSH
3.0000 mL | Freq: Two times a day (BID) | INTRAVENOUS | Status: DC
  Administered 2019-10-27 – 2019-10-28 (×3): 3 mL via INTRAVENOUS

## 2019-10-27 MED ORDER — SODIUM CHLORIDE 0.9 % IV SOLN: 250.0000 mL | INTRAVENOUS | Status: DC | PRN

## 2019-10-27 MED ORDER — SODIUM CHLORIDE 0.9% FLUSH: 3.0000 mL | INTRAVENOUS | Status: DC | PRN

## 2019-10-27 MED ORDER — POTASSIUM CHLORIDE CRYS ER 20 MEQ PO TBCR
30.0000 meq | EXTENDED_RELEASE_TABLET | Freq: Two times a day (BID) | ORAL | Status: DC
  Administered 2019-10-28: 30 meq via ORAL
  Filled 2019-10-27: qty 1

## 2019-10-27 MED ORDER — FUROSEMIDE 80 MG PO TABS
80.0000 mg | ORAL_TABLET | Freq: Every day | ORAL | Status: DC
  Filled 2019-10-27: qty 1

## 2019-10-27 MED ORDER — ASPIRIN 81 MG PO CHEW
81.0000 mg | CHEWABLE_TABLET | ORAL | Status: AC
  Administered 2019-10-28: 81 mg via ORAL
  Filled 2019-10-27: qty 1

## 2019-10-27 NOTE — Plan of Care (Signed)
  Problem: Activity: Goal: Capacity to carry out activities will improve 10/27/2019 2026 by Barton Dubois, RN Outcome: Progressing 10/27/2019 2024 by Barton Dubois, RN Outcome: Progressing   Problem: Cardiac: Goal: Ability to achieve and maintain adequate cardiopulmonary perfusion will improve Outcome: Progressing

## 2019-10-27 NOTE — Plan of Care (Signed)
?  Problem: Activity: ?Goal: Capacity to carry out activities will improve ?Outcome: Progressing ?  ?

## 2019-10-27 NOTE — Progress Notes (Addendum)
Subjective:   Patient examined at bedside. He reports continued improvement in SOB and abdominal distention after diuresis. He states that his appetite is better as well. Shortness of breath also improves. He denies chest pain. Patient has been walked down the hall and back without oxygen requirement. O2 by [place] being weaned. We discussed cardiology recommendations of doing RHC/LHC as well as a thoracentesis. Patient voices understanding and agreeable with the plan.  Patient states that he takes Jardiance, Trulicity, and metformin for DM. The Trulicity dose was increased at last PCP visit but has not yet picked up his new dose. He is motivated to make lifestyle changes and stop smoking.   Objective:  Vital signs in last 24 hours: Vitals:   10/26/19 0438 10/26/19 0833 10/26/19 1936 10/27/19 0525  BP: (!) 179/86 (!) 162/80 (!) 158/85 (!) 168/102  Pulse: (!) 55 76 (!) 55 (!) 52  Resp: 20 20 18 15   Temp: 97.6 F (36.4 C)  97.7 F (36.5 C) 97.8 F (36.6 C)  TempSrc: Oral  Oral Oral  SpO2: 94%  96% 95%  Weight:    88.7 kg  Height:        Physical Exam Physical Exam Constitutional:      General: He is not in acute distress.    Appearance: He is not ill-appearing.  HENT:     Head: Normocephalic.  Cardiovascular:     Rate and Rhythm: Normal rate and regular rhythm.     Heart sounds: Normal heart sounds. No murmur heard.   Pulmonary:     Breath sounds: Examination of the right-lower field reveals decreased breath sounds. Examination of the left-lower field reveals decreased breath sounds. Decreased breath sounds present.     Comments: Crackles noted at lower lung field Abdominal:     Comments: Mildly distended  Musculoskeletal:     Cervical back: Normal range of motion.     Right lower leg: Edema present.     Left lower leg: Edema present.     Comments: +1 bilateral LE edema  Skin:    General: Skin is warm.  Neurological:     Mental Status: He is alert.  Psychiatric:         Mood and Affect: Mood normal.     Assessment/Plan: 59 yo male with PMH of CAD s/p stents, type 2 DM, OSA on CPAP, and HTN, admitted to the hospital for bilateral extremity edema and dyspnea, found to have acute heart failure with reduced EF.  Principal Problem:   Acute congestive heart failure (HCC) Active Problems:   CAD (coronary artery disease)   Tobacco abuse   Hypertension   Hyperlipidemia   Diabetes mellitus (HCC)   Pleural effusion   Acute decompensated heart failure  - Echo: EF 30-35% with regional wall abnormality. Grade III diastolic dysfunction. R ventricular systolic function reduced. Patient has no diagnosis of heart failure in the past. Given h/o CAD and MI, likely ischemic in origin - Patient produced 5L of fluid in the last 24h. Wt drop 195 (dry wt 185) - Cardiology is on board with the treatment plan and recommends continue diuresis. Continue Entresto and will add Spironolactone before discharge.  - RHC/LHC planned tomorrow. - Switch to Lasix 80 mg BID PO. Strict I/O. Daily wt - Continue Atorvastatin 40 mg goal LDL < 70 - Patient has had sinus bradycardia in the 50s with prolonged PRs. Reduce Coreg to 3.125 mg. Continue monitor HR.  - Continue cardiac monitoring    Pleural Effusion  -  CXR shows moderate right pleural effusion .  - IR Thoracentesis ordered.  - Will repeat CXR to reevaluate per Cardio  - Continue diuresis with Lasix    HTN - Continue Coreg 3.125 mg and Entresto. Spironolactone will be added before d/c per Cardio - Continue to monitor vitals    Type 2 DM  - A1C 10.6 - Continue Jardiance, 10 units Lantus qd and Novolog SSC TID  - Continue to monitor blood glucose  - Patient reports taking Trulicity at home. Will continue Metformin, Jardiance and Trulicity when discharged. Encourage patient to follow up with PCP when discharged.   AKI - Likely secondary to renal congestion  - Baseline Creatine 0.8. Cr improves 1.08 - Continue diuresis  -  Daily BMP    OSA - Continue CPAP   FEN: Heart healthy diet VTE ppx: Subcutaneous Lovenox IVF: none CODE STATUS: Full code   Prior to Admission Living Arrangement: home Anticipated Discharge Location: home Barriers to Discharge: hypervolemia, needs additional workup of HFrEF (RHC/LHC) Dispo: Anticipated discharge in approximately 1-2 day(s).   Gaylan Gerold, DO 10/27/2019, 7:11 AM Pager: [phone] After 5pm on weekdays and 1pm on weekends: On Call pager [phone]

## 2019-10-27 NOTE — Progress Notes (Addendum)
Progress Note  Patient Name: [name] Date of Encounter: 10/27/2019  [facility] Cardiologist: Kirk Ruths, MD   Subjective   Breathing and edema improving. Very close to baseline.   Inpatient Medications    Scheduled Meds: . aspirin EC  81 mg Oral Daily  . atorvastatin  20 mg Oral Daily  . carvedilol  3.125 mg Oral BID WC  . empagliflozin  25 mg Oral Daily  . [START ON 10/28/2019] furosemide  80 mg Oral Daily  . insulin aspart  0-20 Units Subcutaneous TID WC  . insulin glargine  10 Units Subcutaneous QHS  . lidocaine  2 patch Transdermal Q24H  . nicotine  21 mg Transdermal Q0600  . [START ON 10/28/2019] potassium chloride  30 mEq Oral BID  . sacubitril-valsartan  1 tablet Oral BID  . sodium chloride flush  3 mL Intravenous Q12H   Continuous Infusions: . sodium chloride     PRN Meds: sodium chloride, acetaminophen, ipratropium-albuterol, ondansetron (ZOFRAN) IV, sodium chloride flush   Vital Signs    Vitals:   10/26/19 0438 10/26/19 0833 10/26/19 1936 10/27/19 0525  BP: (!) 179/86 (!) 162/80 (!) 158/85 (!) 168/102  Pulse: (!) 55 76 (!) 55 (!) 52  Resp: 20 20 18 15   Temp: 97.6 F (36.4 C)  97.7 F (36.5 C) 97.8 F (36.6 C)  TempSrc: Oral  Oral Oral  SpO2: 94%  96% 95%  Weight:    88.7 kg  Height:        Intake/Output Summary (Last 24 hours) at 10/27/2019 1005 Last data filed at 10/27/2019 0824 Gross per 24 hour  Intake 580 ml  Output 4970 ml  Net -4390 ml   Last 3 Weights 10/27/2019 10/26/2019 10/25/2019  Weight (lbs) 195 lb 9.6 oz 204 lb 8 oz 218 lb 1.6 oz  Weight (kg) 88.724 kg 92.761 kg 98.93 kg      Telemetry    NSR - Personally Reviewed  ECG    N/A  Physical Exam   GEN: No acute distress.   Neck: No JVD Cardiac: RRR, no murmurs, rubs, or gallops.  Respiratory: Clear to auscultation bilaterally. GI: Soft, nontender, non-distended  MS: 1+ BL LE  edema; No deformity. Neuro:  Nonfocal  Psych: Normal affect   Labs    High  Sensitivity Troponin:   Recent Labs  Lab 10/24/19 2011 10/24/19 2218  TROPONINIHS 17 19*      Chemistry Recent Labs  Lab 10/25/19 1111 10/26/19 0142 10/27/19 0428  NA 142 139 140  K 3.8 3.7 4.0  CL 104 102 101  CO2 28 27 25   GLUCOSE 126* 225* 160*  BUN 19 20 20   CREATININE 1.15 1.25* 1.03  CALCIUM 9.1 8.6* 9.0  PROT  --  6.1*  --   ALBUMIN  --  3.2*  --   AST  --  27  --   ALT  --  26  --   ALKPHOS  --  106  --   BILITOT  --  1.2  --   GFRNONAA >60 >60 >60  GFRAA >60 >60 >60  ANIONGAP 10 10 14      Hematology Recent Labs  Lab 10/24/19 2011 10/27/19 0428  WBC 6.6 7.3  RBC 5.94* 6.24*  HGB 17.1* 17.6*  HCT 53.9* 56.2*  MCV 90.7 90.1  MCH 28.8 28.2  MCHC 31.7 31.3  RDW 15.4 15.7*  PLT 222 211    BNP Recent Labs  Lab 10/24/19 2012  BNP 1,913.6*  Radiology    ECHOCARDIOGRAM COMPLETE  Result Date: 10/25/2019    ECHOCARDIOGRAM REPORT   Patient Name:   [name] Date of Exam: 10/25/2019 Medical Rec #:  [id number]         Height:       67.0 in Accession #:    [id number]        Weight:       218.1 lb Date of Birth:  1960/09/22         BSA:          2.098 m Patient Age:    59 years          BP:           169/93 mmHg Patient Gender: M                 HR:           56 bpm. Exam Location:  Inpatient Procedure: 2D Echo, Cardiac Doppler and Color Doppler Indications:    CHF-Acute Systolic 102.58 / N27.78  History:        Patient has prior history of Echocardiogram examinations, most                 recent 09/18/2011. CAD; Risk Factors:Hypertension, Diabetes and                 Dyslipidemia. Tobacco abuse,Heart attack ([place]) (From Hx).  Sonographer:    Alvino Chapel RCS Referring Phys: [place]  1. Left ventricular ejection fraction, by estimation, is 30 to 35%. The left ventricle has moderately decreased function. The left ventricle demonstrates regional wall motion abnormalities (see scoring diagram/findings for description). There is moderate  left ventricular hypertrophy. Left ventricular diastolic parameters are consistent with Grade III diastolic dysfunction (restrictive). Suggest Definity contrast study to more effectively exclude apical thrombus - acoustic shadowing and false tendon noted.  2. Right ventricular systolic function is mildly reduced. The right ventricular size is normal. There is moderately elevated pulmonary artery systolic pressure. The estimated right ventricular systolic pressure is 24.2 mmHg.  3. Left atrial size was mild to moderately dilated.  4. Right atrial size was mild to moderately dilated.  5. The mitral valve is abnormal, mildly thickened and with annular calcification. Mild mitral valve regurgitation.  6. The tricuspid valve is abnormal.  7. The aortic valve is tricuspid. Aortic valve regurgitation is trivial.  8. The inferior vena cava is dilated in size with <50% respiratory variability, suggesting right atrial pressure of 15 mmHg. FINDINGS  Left Ventricle: Left ventricular ejection fraction, by estimation, is 30 to 35%. The left ventricle has moderately decreased function. The left ventricle demonstrates regional wall motion abnormalities. The left ventricular internal cavity size was normal in size. There is moderate left ventricular hypertrophy. Left ventricular diastolic parameters are consistent with Grade III diastolic dysfunction (restrictive).  LV Wall Scoring: The mid and distal anterior wall and entire apex are akinetic. The inferior wall, mid anterolateral segment, and basal anterior segment are hypokinetic. The basal anterolateral segment, mid inferoseptal segment, and basal inferoseptal segment are normal. Right Ventricle: The right ventricular size is normal. No increase in right ventricular wall thickness. Right ventricular systolic function is mildly reduced. There is moderately elevated pulmonary artery systolic pressure. The tricuspid regurgitant velocity is 2.90 m/s, and with an assumed right atrial  pressure of 15 mmHg, the estimated right ventricular systolic pressure is 35.3 mmHg. Left Atrium: Left atrial size was mild to moderately dilated. Right Atrium:  Right atrial size was mild to moderately dilated. Pericardium: Trivial pericardial effusion is present. The pericardial effusion is posterior to the left ventricle. Mitral Valve: The mitral valve is abnormal. There is mild thickening of the mitral valve leaflet(s). Mild mitral annular calcification. Mild mitral valve regurgitation. Tricuspid Valve: The tricuspid valve is abnormal. Tricuspid valve regurgitation is mild. Aortic Valve: The aortic valve is tricuspid. Aortic valve regurgitation is trivial. Mild aortic valve annular calcification. Pulmonic Valve: The pulmonic valve was grossly normal. Pulmonic valve regurgitation is trivial. Aorta: The aortic root is normal in size and structure. Venous: The inferior vena cava is dilated in size with less than 50% respiratory variability, suggesting right atrial pressure of 15 mmHg. IAS/Shunts: No atrial level shunt detected by color flow Doppler.  LEFT VENTRICLE PLAX 2D LVIDd:         5.17 cm      Diastology LVIDs:         4.10 cm      LV e' lateral:   3.37 cm/s LV PW:         1.50 cm      LV E/e' lateral: 28.3 LV IVS:        1.60 cm      LV e' medial:    4.24 cm/s LVOT diam:     1.70 cm      LV E/e' medial:  22.5 LV SV:         48 LV SV Index:   23 LVOT Area:     2.27 cm  LV Volumes (MOD) LV vol d, MOD A2C: 117.0 ml LV vol d, MOD A4C: 121.0 ml LV vol s, MOD A2C: 71.3 ml LV vol s, MOD A4C: 68.8 ml LV SV MOD A2C:     45.7 ml LV SV MOD A4C:     121.0 ml LV SV MOD BP:      51.5 ml RIGHT VENTRICLE RV S prime:     8.81 cm/s TAPSE (M-mode): 1.5 cm LEFT ATRIUM             Index       RIGHT ATRIUM           Index LA diam:        4.10 cm 1.95 cm/m  RA Area:     27.70 cm LA Vol (A2C):   84.9 ml 40.47 ml/m RA Volume:   101.00 ml 48.14 ml/m LA Vol (A4C):   81.3 ml 38.75 ml/m LA Biplane Vol: 87.2 ml 41.56 ml/m  AORTIC  VALVE LVOT Vmax:   99.20 cm/s LVOT Vmean:  63.800 cm/s LVOT VTI:    0.212 m  AORTA Ao Root diam: 3.50 cm MITRAL VALVE               TRICUSPID VALVE MV Area (PHT): 4.21 cm    TR Peak grad:   33.6 mmHg MV Decel Time: 180 msec    TR Vmax:        290.00 cm/s MV E velocity: 95.50 cm/s MV A velocity: 23.10 cm/s  SHUNTS MV E/A ratio:  4.13        Systemic VTI:  0.21 m                            Systemic Diam: 1.70 cm Rozann Lesches MD Electronically signed by Rozann Lesches MD Signature Date/Time: 10/25/2019/4:49:11 PM    Final    ECHOCARDIOGRAM LIMITED  Result Date: 10/26/2019  ECHOCARDIOGRAM LIMITED REPORT   Patient Name:   [name] Date of Exam: 10/26/2019 Medical Rec #:  [id number]         Height:       67.0 in Accession #:    [id number]        Weight:       204.5 lb Date of Birth:  19-Sep-1960         BSA:          2.041 m Patient Age:    59 years          BP:           162/80 mmHg Patient Gender: M                 HR:           76 bpm. Exam Location:  Inpatient Procedure: Intracardiac Opacification Agent and Limited Echo Indications:    Thrombus in heart chamber [[id number]]  History:        Patient has prior history of Echocardiogram examinations, most                 recent 10/25/2019. CAD, PAD; Risk Factors:Hypertension, Diabetes                 and Sleep Apnea.  Sonographer:    Darlina Sicilian RDCS Referring Phys: [place]  1. No formed LV mural thrombus noted with Definity contrast. FINDINGS  Left Ventricle: No formed LV mural thrombus noted with Definity contrast.   Rozann Lesches MD Electronically signed by Rozann Lesches MD Signature Date/Time: 10/26/2019/5:04:29 PM    Final     Cardiac Studies   Limited Echo 10/26/19 1. No formed LV mural thrombus noted with Definity contrast.   FINDINGS  Left Ventricle: No formed LV mural thrombus noted with Definity contrast.   Echo 10/25/19 1. Left ventricular ejection fraction, by estimation, is 30 to 35%. The  left ventricle has  moderately decreased function. The left ventricle  demonstrates regional wall motion abnormalities (see scoring  diagram/findings for description). There is  moderate left ventricular hypertrophy. Left ventricular diastolic  parameters are consistent with Grade III diastolic dysfunction  (restrictive). Suggest Definity contrast study to more effectively exclude  apical thrombus - acoustic shadowing and false  tendon noted.  2. Right ventricular systolic function is mildly reduced. The right  ventricular size is normal. There is moderately elevated pulmonary artery  systolic pressure. The estimated right ventricular systolic pressure is  57.0 mmHg.  3. Left atrial size was mild to moderately dilated.  4. Right atrial size was mild to moderately dilated.  5. The mitral valve is abnormal, mildly thickened and with annular  calcification. Mild mitral valve regurgitation.  6. The tricuspid valve is abnormal.  7. The aortic valve is tricuspid. Aortic valve regurgitation is trivial.  8. The inferior vena cava is dilated in size with <50% respiratory  variability, suggesting right atrial pressure of 15 mmHg.   Patient Profile     [name] is a 59 y.o. male with a hx of CAD, (acute MI 2005 with PTCA to Diag, then stent to LCX in 2007 , last cath 2008 with 20% LM, 50% LAD and 70%mLAD, ? Hx of EF 29% on myoview in 2013 but echo in 2013 with normal EF (60-65%), HTN, DM-2, HLD, + tobacco, chronic pain , PAD  who is being seen  for the evaluation of cardiomyopathy at the request of Dr. Rebeca Alert.  Assessment & Plan    1. Acute combined CHF - Echo this admission showed LVEF of 30-35% and grade III DD (EF was 60-65% in 2013). No LV thrombus on limited echo.  - Diuresed > 10L - Weight 213>>195lb - Heart failure education given. Will get nutritional consult - For Left and right cath tomorrow - The patient understands that risks include but are not limited to stroke (1 in 1000), death (1  in 1000), kidney failure [usually temporary] (1 in 500), bleeding (1 in 200), allergic reaction [possibly serious] (1 in 200), and agrees to proceed.  - IV lasix changed to Po lasix 80mg  qd starting tomorrow - Started Entresto and Coreg today - Add TED hose  2. AKi - Resolved - Followed closely - Entresto started today  3. HTN - Remained elevated - Added Coreg and Entresto today  4. CAD - Continue ASA and statin - No chest pain - Cath tomorrow  5. HLD - 10/27/2019: Cholesterol 135; HDL 34; LDL Cholesterol 83; Triglycerides 89; VLDL 18  - Increase Lipitor to 40mg  qd  6. DM - Per primary team   7. Tobacco abuse - Recommended cessation - On nicotine patch  8. PAD - Followed by vascular - On ASA and statin   For questions or updates, please contact [place] Please consult [email or web address] for contact info under        SignedLeanor Kail, PA  10/27/2019, 10:05 AM

## 2019-10-27 NOTE — Procedures (Addendum)
Ultrasound-guided diagnostic and therapeutic right sided thoracentesis performed yielding 1 liters of straw colored fluid. No immediate complications.   Diagnostic fluid was sent to the lab for further analysis. Follow-up chest x-ray pending. EBL is < 2 ml.      

## 2019-10-27 NOTE — Progress Notes (Signed)
Inpatient Diabetes Program Recommendations  AACE/ADA: New Consensus Statement on Inpatient Glycemic Control (2015)  Target Ranges:  Prepandial:   less than 140 mg/dL      Peak postprandial:   less than 180 mg/dL (1-2 hours)      Critically ill patients:  140 - 180 mg/dL   Lab Results  Component Value Date   GLUCAP 222 (H) 10/27/2019   HGBA1C 10.6 (H) 10/25/2019    Review of Glycemic Control Results for [name], Lee L "MIKE" (MRN [id number]) as of 10/27/2019 14:02  Ref. Range 10/26/2019 11:11 10/26/2019 16:21 10/26/2019 21:24 10/27/2019 06:14 10/27/2019 11:14  Glucose-Capillary Latest Ref Range: 70 - 99 mg/dL 226 (H) 129 (H) 152 (H) 167 (H) 222 (H)   Diabetes history: DM 2 Outpatient Diabetes medications:  Metformin 1779 mg bid, Trulicity 3.90 mg weekly, Jardiance 25 mg daily Current orders for Inpatient glycemic control:  Novolog resistant tid with meals Jardiance 25 mg daily Lantus 10 units q HS Inpatient Diabetes Program Recommendations:    Spoke to pt. Regarding home DM management.  He states that he recently saw his Endocrinologist and Trulicity dose was increased (however he has not picked up the new dose yet).  We discussed A1C results and goal of 7%.  He is currently on low dose basal insulin in the hospital.  Discussed potential need for insulin at home.  However encouraged patient to discuss this with his endocrinologist.  Encouraged him to check blood sugars at least 2 times a day and to let Dr. Meredith Pel know if blood sugars consistently >180 mg/dL.  Patient states that he knows this is very important and is motivated to make these changes.

## 2019-10-27 NOTE — Procedures (Addendum)
Patient declined the use of CPAP while in the hospital.

## 2019-10-28 ENCOUNTER — Inpatient Hospital Stay (HOSPITAL_COMMUNITY)
Admission: EM | Disposition: A | Discharge status: Home / Self Care | Payer: Self-pay | Source: Home / Self Care | Attending: Cardiothoracic Surgery

## 2019-10-28 DIAGNOSIS — I10 Essential (primary) hypertension: Secondary | ICD-10-CM

## 2019-10-28 HISTORY — PX: RIGHT/LEFT HEART CATH AND CORONARY ANGIOGRAPHY: CATH118266

## 2019-10-28 LAB — POCT I-STAT EG7
Acid-Base Excess: 0 mmol/L (ref 0.0–2.0)
Bicarbonate: 25.7 mmol/L (ref 20.0–28.0)
Calcium, Ion: 1.12 mmol/L — ABNORMAL LOW (ref 1.15–1.40)
HCT: 57 % — ABNORMAL HIGH (ref 39.0–52.0)
Hemoglobin: 19.4 g/dL — ABNORMAL HIGH (ref 13.0–17.0)
O2 Saturation: 75 %
Potassium: 3.7 mmol/L (ref 3.5–5.1)
Sodium: 145 mmol/L (ref 135–145)
TCO2: 27 mmol/L (ref 22–32)
pCO2, Ven: 44.7 mmHg (ref 44.0–60.0)
pH, Ven: 7.368 (ref 7.250–7.430)
pO2, Ven: 42 mmHg (ref 32.0–45.0)

## 2019-10-28 LAB — CBC
HCT: 56.7 % — ABNORMAL HIGH (ref 39.0–52.0)
Hemoglobin: 17.8 g/dL — ABNORMAL HIGH (ref 13.0–17.0)
MCH: 28.2 pg (ref 26.0–34.0)
MCHC: 31.4 g/dL (ref 30.0–36.0)
MCV: 89.9 fL (ref 80.0–100.0)
Platelets: 206 10*3/uL (ref 150–400)
RBC: 6.31 MIL/uL — ABNORMAL HIGH (ref 4.22–5.81)
RDW: 15.7 % — ABNORMAL HIGH (ref 11.5–15.5)
WBC: 8.6 10*3/uL (ref 4.0–10.5)
nRBC: 0 % (ref 0.0–0.2)

## 2019-10-28 LAB — GLUCOSE, CAPILLARY
Glucose-Capillary: 137 mg/dL — ABNORMAL HIGH (ref 70–99)
Glucose-Capillary: 116 mg/dL — ABNORMAL HIGH (ref 70–99)
Glucose-Capillary: 90 mg/dL (ref 70–99)
Glucose-Capillary: 377 mg/dL — ABNORMAL HIGH (ref 70–99)

## 2019-10-28 LAB — BASIC METABOLIC PANEL
Anion gap: 12 (ref 5–15)
BUN: 19 mg/dL (ref 6–20)
CO2: 26 mmol/L (ref 22–32)
Calcium: 9 mg/dL (ref 8.9–10.3)
Chloride: 104 mmol/L (ref 98–111)
Creatinine, Ser: 1.16 mg/dL (ref 0.61–1.24)
GFR calc Af Amer: >60 mL/min (ref >60)
GFR calc non Af Amer: >60 mL/min (ref >60)
Glucose, Bld: 131 mg/dL — ABNORMAL HIGH (ref 70–99)
Potassium: 4 mmol/L (ref 3.5–5.1)
Sodium: 142 mmol/L (ref 135–145)

## 2019-10-28 LAB — POCT I-STAT 7, (LYTES, BLD GAS, ICA,H+H)
Acid-base deficit: 1 mmol/L (ref 0.0–2.0)
Bicarbonate: 23.2 mmol/L (ref 20.0–28.0)
Calcium, Ion: 0.98 mmol/L — ABNORMAL LOW (ref 1.15–1.40)
HCT: 53 % — ABNORMAL HIGH (ref 39.0–52.0)
Hemoglobin: 18 g/dL — ABNORMAL HIGH (ref 13.0–17.0)
O2 Saturation: 97 %
Potassium: 3.3 mmol/L — ABNORMAL LOW (ref 3.5–5.1)
Sodium: 145 mmol/L (ref 135–145)
TCO2: 24 mmol/L (ref 22–32)
pCO2 arterial: 38.5 mmHg (ref 32.0–48.0)
pH, Arterial: 7.388 (ref 7.350–7.450)
pO2, Arterial: 92 mmHg (ref 83.0–108.0)

## 2019-10-28 LAB — CYTOLOGY - NON PAP

## 2019-10-28 LAB — LACTATE DEHYDROGENASE: LDH: 194 U/L — ABNORMAL HIGH (ref 98–192)

## 2019-10-28 SURGERY — RIGHT/LEFT HEART CATH AND CORONARY ANGIOGRAPHY
Anesthesia: LOCAL

## 2019-10-28 MED ORDER — DEXMEDETOMIDINE HCL IN NACL 400 MCG/100ML IV SOLN
0.1000 ug/kg/h | INTRAVENOUS | Status: AC
  Administered 2019-10-29: .5 ug/kg/h via INTRAVENOUS
  Filled 2019-10-28 (×2): qty 100

## 2019-10-28 MED ORDER — PHENYLEPHRINE HCL-NACL 20-0.9 MG/250ML-% IV SOLN
30.0000 ug/min | INTRAVENOUS | Status: DC
  Filled 2019-10-28 (×2): qty 250

## 2019-10-28 MED ORDER — VERAPAMIL HCL 2.5 MG/ML IV SOLN
INTRAVENOUS | Status: AC
  Filled 2019-10-28: qty 2

## 2019-10-28 MED ORDER — MIDAZOLAM HCL 2 MG/2ML IJ SOLN
INTRAMUSCULAR | Status: AC
  Filled 2019-10-28: qty 2

## 2019-10-28 MED ORDER — MIDAZOLAM HCL 2 MG/2ML IJ SOLN
INTRAMUSCULAR | Status: DC | PRN
  Administered 2019-10-28: 1 mg via INTRAVENOUS
  Administered 2019-10-28: 2 mg via INTRAVENOUS

## 2019-10-28 MED ORDER — FENTANYL CITRATE (PF) 100 MCG/2ML IJ SOLN
INTRAMUSCULAR | Status: AC
  Filled 2019-10-28: qty 2

## 2019-10-28 MED ORDER — NITROGLYCERIN IN D5W 200-5 MCG/ML-% IV SOLN
2.0000 ug/min | INTRAVENOUS | Status: AC
  Administered 2019-10-29: 5 ug/min via INTRAVENOUS
  Filled 2019-10-28: qty 250

## 2019-10-28 MED ORDER — HEPARIN (PORCINE) IN NACL 1000-0.9 UT/500ML-% IV SOLN
INTRAVENOUS | Status: DC | PRN
  Administered 2019-10-28 (×2): 500 mL

## 2019-10-28 MED ORDER — SODIUM CHLORIDE 0.9 % IV SOLN: 250.0000 mL | INTRAVENOUS | Status: DC | PRN

## 2019-10-28 MED ORDER — MAGNESIUM SULFATE 50 % IJ SOLN
40.0000 meq | INTRAMUSCULAR | Status: DC
  Filled 2019-10-28: qty 9.85

## 2019-10-28 MED ORDER — IOHEXOL 350 MG/ML SOLN
INTRAVENOUS | Status: DC | PRN
  Administered 2019-10-28: 50 mL via INTRA_ARTERIAL

## 2019-10-28 MED ORDER — SODIUM CHLORIDE 0.9% FLUSH: 3.0000 mL | INTRAVENOUS | Status: DC | PRN

## 2019-10-28 MED ORDER — TRANEXAMIC ACID 1000 MG/10ML IV SOLN
1.5000 mg/kg/h | INTRAVENOUS | Status: AC
  Administered 2019-10-29: 1.5 mg/kg/h via INTRAVENOUS
  Filled 2019-10-28: qty 25

## 2019-10-28 MED ORDER — LABETALOL HCL 5 MG/ML IV SOLN: 10.0000 mg | INTRAVENOUS | Status: AC | PRN

## 2019-10-28 MED ORDER — POTASSIUM CHLORIDE 2 MEQ/ML IV SOLN
80.0000 meq | INTRAVENOUS | Status: DC
  Filled 2019-10-28: qty 40

## 2019-10-28 MED ORDER — INSULIN REGULAR(HUMAN) IN NACL 100-0.9 UT/100ML-% IV SOLN
INTRAVENOUS | Status: AC
  Administered 2019-10-29: 2.6 [IU]/h via INTRAVENOUS
  Filled 2019-10-28 (×2): qty 100

## 2019-10-28 MED ORDER — TRANEXAMIC ACID (OHS) BOLUS VIA INFUSION
15.0000 mg/kg | INTRAVENOUS | Status: AC
  Administered 2019-10-29: 1275 mg via INTRAVENOUS
  Filled 2019-10-28: qty 1275

## 2019-10-28 MED ORDER — SODIUM CHLORIDE 0.9 % IV SOLN
750.0000 mg | INTRAVENOUS | Status: AC
  Administered 2019-10-29: 750 mg via INTRAVENOUS
  Filled 2019-10-28: qty 750

## 2019-10-28 MED ORDER — VANCOMYCIN HCL 1500 MG/300ML IV SOLN
1500.0000 mg | INTRAVENOUS | Status: AC
  Administered 2019-10-29: 1500 mg via INTRAVENOUS
  Filled 2019-10-28 (×2): qty 300

## 2019-10-28 MED ORDER — SPIRONOLACTONE 12.5 MG HALF TABLET
12.5000 mg | ORAL_TABLET | Freq: Every day | ORAL | Status: DC
  Administered 2019-10-28: 12.5 mg via ORAL
  Filled 2019-10-28 (×2): qty 1

## 2019-10-28 MED ORDER — FENTANYL CITRATE (PF) 100 MCG/2ML IJ SOLN
INTRAMUSCULAR | Status: DC | PRN
  Administered 2019-10-28: 25 ug via INTRAVENOUS
  Administered 2019-10-28: 50 ug via INTRAVENOUS

## 2019-10-28 MED ORDER — MILRINONE LACTATE IN DEXTROSE 20-5 MG/100ML-% IV SOLN
0.3000 ug/kg/min | INTRAVENOUS | Status: AC
  Administered 2019-10-29: .3 ug/kg/min via INTRAVENOUS
  Filled 2019-10-28: qty 100

## 2019-10-28 MED ORDER — LIDOCAINE HCL (PF) 1 % IJ SOLN
INTRAMUSCULAR | Status: DC | PRN
  Administered 2019-10-28: 2 mL via INTRADERMAL
  Administered 2019-10-28: 12 mL via INTRADERMAL

## 2019-10-28 MED ORDER — EPINEPHRINE HCL 5 MG/250ML IV SOLN IN NS
0.0000 ug/min | INTRAVENOUS | Status: AC
  Administered 2019-10-29: 5 ug/min via INTRAVENOUS
  Filled 2019-10-28 (×2): qty 250

## 2019-10-28 MED ORDER — SODIUM CHLORIDE 0.9 % IV SOLN: INTRAVENOUS | Status: DC

## 2019-10-28 MED ORDER — VERAPAMIL HCL 2.5 MG/ML IV SOLN
INTRAVENOUS | Status: DC | PRN
  Administered 2019-10-28: 10 mL via INTRA_ARTERIAL

## 2019-10-28 MED ORDER — PLASMA-LYTE 148 IV SOLN
INTRAVENOUS | Status: DC
  Filled 2019-10-28: qty 2.5

## 2019-10-28 MED ORDER — HEPARIN SODIUM (PORCINE) 1000 UNIT/ML IJ SOLN
INTRAMUSCULAR | Status: AC
  Filled 2019-10-28: qty 1

## 2019-10-28 MED ORDER — HYDRALAZINE HCL 20 MG/ML IJ SOLN: 10.0000 mg | INTRAMUSCULAR | Status: AC | PRN

## 2019-10-28 MED ORDER — HEPARIN (PORCINE) IN NACL 1000-0.9 UT/500ML-% IV SOLN
INTRAVENOUS | Status: AC
  Filled 2019-10-28: qty 1000

## 2019-10-28 MED ORDER — FUROSEMIDE 10 MG/ML IJ SOLN
80.0000 mg | Freq: Two times a day (BID) | INTRAMUSCULAR | Status: DC
  Administered 2019-10-28: 80 mg via INTRAVENOUS
  Filled 2019-10-28 (×2): qty 8

## 2019-10-28 MED ORDER — LIDOCAINE HCL (PF) 1 % IJ SOLN
INTRAMUSCULAR | Status: AC
  Filled 2019-10-28: qty 30

## 2019-10-28 MED ORDER — NOREPINEPHRINE 4 MG/250ML-% IV SOLN
0.0000 ug/min | INTRAVENOUS | Status: DC
  Filled 2019-10-28 (×2): qty 250

## 2019-10-28 MED ORDER — SODIUM CHLORIDE 0.9 % IV SOLN
INTRAVENOUS | Status: DC
  Filled 2019-10-28: qty 30

## 2019-10-28 MED ORDER — SODIUM CHLORIDE 0.9% FLUSH
3.0000 mL | Freq: Two times a day (BID) | INTRAVENOUS | Status: DC
  Administered 2019-10-28 (×2): 3 mL via INTRAVENOUS

## 2019-10-28 MED ORDER — SODIUM CHLORIDE 0.9 % IV SOLN
1.5000 g | INTRAVENOUS | Status: AC
  Administered 2019-10-29: 1.5 g via INTRAVENOUS
  Filled 2019-10-28: qty 1.5

## 2019-10-28 MED ORDER — TRANEXAMIC ACID (OHS) PUMP PRIME SOLUTION
2.0000 mg/kg | INTRAVENOUS | Status: DC
  Filled 2019-10-28: qty 1.7

## 2019-10-28 SURGICAL SUPPLY — 18 items
CATH 5FR JL3.5 JR4 ANG PIG MP (CATHETERS) ×1 IMPLANT
CATH BALLN WEDGE 5F 110CM (CATHETERS) ×1 IMPLANT
CATH INFINITI 5FR JL4 (CATHETERS) ×1 IMPLANT
CLOSURE MYNX CONTROL 5F (Vascular Products) ×1 IMPLANT
DEVICE RAD COMP TR BAND LRG (VASCULAR PRODUCTS) ×1 IMPLANT
GLIDESHEATH SLEND SS 6F .021 (SHEATH) ×1 IMPLANT
GUIDEWIRE INQWIRE 1.5J.035X260 (WIRE) IMPLANT
INQWIRE 1.5J .035X260CM (WIRE) ×2
KIT HEART LEFT (KITS) ×2 IMPLANT
PACK CARDIAC CATHETERIZATION (CUSTOM PROCEDURE TRAY) ×2 IMPLANT
SHEATH GLIDE SLENDER 4/5FR (SHEATH) ×1 IMPLANT
SHEATH PINNACLE 5F 10CM (SHEATH) ×1 IMPLANT
SHEATH PROBE COVER 6X72 (BAG) ×1 IMPLANT
TRANSDUCER W/STOPCOCK (MISCELLANEOUS) ×2 IMPLANT
TUBING CIL FLEX 10 FLL-RA (TUBING) ×2 IMPLANT
WIRE EMERALD 3MM-J .035X150CM (WIRE) ×1 IMPLANT
WIRE EMERALD ST .035X150CM (WIRE) IMPLANT
WIRE HI TORQ VERSACORE-J 145CM (WIRE) ×1 IMPLANT

## 2019-10-28 NOTE — H&P (View-Only) (Signed)
Progress Note  Patient Name: [name] Date of Encounter: 10/28/2019  [facility] Cardiologist: Kirk Ruths, MD   Subjective   Breathing and edema improving. Very close to baseline.   Inpatient Medications    Scheduled Meds: . aspirin EC  81 mg Oral Daily  . atorvastatin  40 mg Oral Daily  . carvedilol  3.125 mg Oral BID WC  . empagliflozin  25 mg Oral Daily  . furosemide  80 mg Oral Daily  . insulin aspart  0-20 Units Subcutaneous TID WC  . insulin glargine  10 Units Subcutaneous QHS  . lidocaine  2 patch Transdermal Q24H  . nicotine  21 mg Transdermal Q0600  . potassium chloride  30 mEq Oral BID  . sacubitril-valsartan  1 tablet Oral BID  . sodium chloride flush  3 mL Intravenous Q12H  . sodium chloride flush  3 mL Intravenous Q12H   Continuous Infusions: . sodium chloride    . sodium chloride    . sodium chloride 10 mL/hr at 10/28/19 0740   PRN Meds: sodium chloride, sodium chloride, acetaminophen, ipratropium-albuterol, ondansetron (ZOFRAN) IV, sodium chloride flush, sodium chloride flush   Vital Signs    Vitals:   10/27/19 1449 10/27/19 1941 10/28/19 0037 10/28/19 0409  BP: (!) 146/68 (!) 150/70  (!) 148/68  Pulse: (!) 56 (!) 56  (!) 57  Resp: 18 18  18   Temp: 98.7 F (37.1 C) 98.6 F (37 C)  98.4 F (36.9 C)  TempSrc: Oral Oral  Oral  SpO2: 92% 96%  96%  Weight:   85 kg   Height:        Intake/Output Summary (Last 24 hours) at 10/28/2019 0826 Last data filed at 10/28/2019 0410 Gross per 24 hour  Intake 942 ml  Output 3150 ml  Net -2208 ml   Last 3 Weights 10/28/2019 10/27/2019 10/26/2019  Weight (lbs) 187 lb 6.4 oz 195 lb 9.6 oz 204 lb 8 oz  Weight (kg) 85.004 kg 88.724 kg 92.761 kg      Telemetry    NSR - Personally Reviewed  ECG    N/A  Physical Exam   GEN: No acute distress.   Neck: No JVD Cardiac: RRR, no murmurs, rubs, or gallops.  Respiratory: Clear to auscultation bilaterally. GI: Soft, nontender, non-distended  MS: 1+  BL LE  edema; No deformity. Neuro:  Nonfocal  Psych: Normal affect   Labs    High Sensitivity Troponin:   Recent Labs  Lab 10/24/19 2011 10/24/19 2218  TROPONINIHS 17 19*      Chemistry Recent Labs  Lab 10/25/19 1111 10/26/19 0142 10/27/19 0428 10/27/19 1700  NA 142 139 140  --   K 3.8 3.7 4.0  --   CL 104 102 101  --   CO2 28 27 25   --   GLUCOSE 126* 225* 160*  --   BUN 19 20 20   --   CREATININE 1.15 1.25* 1.03  --   CALCIUM 9.1 8.6* 9.0  --   PROT  --  6.1*  --  6.4*  ALBUMIN  --  3.2*  --   --   AST  --  27  --   --   ALT  --  26  --   --   ALKPHOS  --  106  --   --   BILITOT  --  1.2  --   --   GFRNONAA >60 >60 >60  --   GFRAA >60 >60 >60  --  ANIONGAP 10 10 14   --      Hematology Recent Labs  Lab 10/24/19 2011 10/27/19 0428  WBC 6.6 7.3  RBC 5.94* 6.24*  HGB 17.1* 17.6*  HCT 53.9* 56.2*  MCV 90.7 90.1  MCH 28.8 28.2  MCHC 31.7 31.3  RDW 15.4 15.7*  PLT 222 211    BNP Recent Labs  Lab 10/24/19 2012  BNP 1,913.6*     Radiology    DG Chest 2 View  Result Date: 10/27/2019 CLINICAL DATA:  Post right thoracentesis EXAM: CHEST - 2 VIEW COMPARISON:  10/24/2019 FINDINGS: Cardiomegaly with mild central congestion. Decreased right pleural effusion with small residual. No pneumothorax. Posterior lower lobe opacity on lateral view. Aortic atherosclerosis. IMPRESSION: 1. Decreased right pleural effusion with small residual. No pneumothorax. 2. Cardiomegaly with mild central congestion. 3. Posterior lung base opacity may reflect atelectasis, pneumonia, or mass. Short interval radiographic follow-up to clearing is recommended, alternatively chest CT could be considered. Electronically Signed   By: Donavan Foil M.D.   On: 10/27/2019 15:12   ECHOCARDIOGRAM LIMITED  Result Date: 10/26/2019    ECHOCARDIOGRAM LIMITED REPORT   Patient Name:   ALPHEUS STIFF Laidler Date of Exam: 10/26/2019 Medical Rec #:  [id number]         Height:       67.0 in Accession #:     [id number]        Weight:       204.5 lb Date of Birth:  1960/06/19         BSA:          2.041 m Patient Age:    59 years          BP:           162/80 mmHg Patient Gender: M                 HR:           76 bpm. Exam Location:  Inpatient Procedure: Intracardiac Opacification Agent and Limited Echo Indications:    Thrombus in heart chamber [[id number]]  History:        Patient has prior history of Echocardiogram examinations, most                 recent 10/25/2019. CAD, PAD; Risk Factors:Hypertension, Diabetes                 and Sleep Apnea.  Sonographer:    Darlina Sicilian RDCS Referring Phys: [place]  1. No formed LV mural thrombus noted with Definity contrast. FINDINGS  Left Ventricle: No formed LV mural thrombus noted with Definity contrast.   Rozann Lesches MD Electronically signed by Rozann Lesches MD Signature Date/Time: 10/26/2019/5:04:29 PM    Final    IR THORACENTESIS ASP PLEURAL SPACE W/IMG GUIDE  Result Date: 10/27/2019 INDICATION: Patient with history acute combined congestive heart failure and AKI presents for therapeutic and diagnostic right-sided thoracentesis EXAM: ULTRASOUND GUIDED THERAPEUTIC AND DIAGNOSTIC THORACENTESIS MEDICATIONS: Lidocaine 1% 10 mL COMPLICATIONS: None immediate. PROCEDURE: An ultrasound guided thoracentesis was thoroughly discussed with the patient and questions answered. The benefits, risks, alternatives and complications were also discussed. The patient understands and wishes to proceed with the procedure. Written consent was obtained. Ultrasound was performed to localize and mark an adequate pocket of fluid in the right sided chest. The area was then prepped and draped in the normal sterile fashion. 1% Lidocaine was used for local anesthesia. Under ultrasound guidance a 6 Fr  Safe-T-Centesis catheter was introduced. Thoracentesis was performed. The catheter was removed and a dressing applied. FINDINGS: A total of approximately 1 L of straw-colored fluid  was removed. Samples were sent to the laboratory as requested by the clinical team. IMPRESSION: Successful ultrasound guided therapeutic and diagnostic right-sided thoracentesis yielding 1 L of pleural fluid. Read by: Rushie Nyhan, NP Electronically Signed   By: Lucrezia Europe M.D.   On: 10/27/2019 15:30    Cardiac Studies   Limited Echo 10/26/19 1. No formed LV mural thrombus noted with Definity contrast.   FINDINGS  Left Ventricle: No formed LV mural thrombus noted with Definity contrast.   Echo 10/25/19 1. Left ventricular ejection fraction, by estimation, is 30 to 35%. The  left ventricle has moderately decreased function. The left ventricle  demonstrates regional wall motion abnormalities (see scoring  diagram/findings for description). There is  moderate left ventricular hypertrophy. Left ventricular diastolic  parameters are consistent with Grade III diastolic dysfunction  (restrictive). Suggest Definity contrast study to more effectively exclude  apical thrombus - acoustic shadowing and false  tendon noted.  2. Right ventricular systolic function is mildly reduced. The right  ventricular size is normal. There is moderately elevated pulmonary artery  systolic pressure. The estimated right ventricular systolic pressure is  35.3 mmHg.  3. Left atrial size was mild to moderately dilated.  4. Right atrial size was mild to moderately dilated.  5. The mitral valve is abnormal, mildly thickened and with annular  calcification. Mild mitral valve regurgitation.  6. The tricuspid valve is abnormal.  7. The aortic valve is tricuspid. Aortic valve regurgitation is trivial.  8. The inferior vena cava is dilated in size with <50% respiratory  variability, suggesting right atrial pressure of 15 mmHg.   Patient Profile     MAHLON GABRIELLE is a 59 y.o. male with a hx of CAD, (acute MI 2005 with PTCA to Diag, then stent to LCX in 2007 , last cath 2008 with 20% LM, 50% LAD and  70%mLAD, ? Hx of EF 29% on myoview in 2013 but echo in 2013 with normal EF (60-65%), HTN, DM-2, HLD, + tobacco, chronic pain , PAD  who is being seen  for the evaluation of cardiomyopathy at the request of Dr. Rebeca Alert.  Assessment & Plan    1. Acute combined CHF - Echo this admission showed LVEF of 30-35% and grade III DD (EF was 60-65% in 2013). No LV thrombus on limited echo.  - Diuresed > 13L - Weight down 26 lbs - Heart failure education given. Will get nutritional consult - For Left and right cath today - s/p right thoracentesis yesterday with removal of 1 liter of fluid. Looks transudative.  - will change lasix 80mg  qd starting today - Started Entresto and Coreg  - add aldactone 12.5 mg daily. Follow BMET - Add TED hose  2. AKi - Resolved - Followed closely - monitor with change in meds.   3. HTN - controlled.  - Added Coreg and Entresto today  4. CAD - Continue ASA and statin - No chest pain - Cath tomorrow  5. HLD - 10/27/2019: Cholesterol 135; HDL 34; LDL Cholesterol 83; Triglycerides 89; VLDL 18  - Increase Lipitor to 40mg  qd  6. DM - Per primary team   7. Tobacco abuse - Recommended cessation - On nicotine patch  8. PAD - Followed by vascular - On ASA and statin   For questions or updates, please contact [place] Please consult [email or web address]  for contact info under        Signed, Britzy Graul [place], MD  10/28/2019, 8:26 AM

## 2019-10-28 NOTE — Progress Notes (Signed)
Nutrition Education Note  RD consulted for nutrition education regarding CHF and diabetes.  Lab Results  Component Value Date   HGBA1C 10.6 (H) 10/25/2019   PTA DM medications are Metformin 0100 mg bid, Trulicity 7.12 mg weekly, Jardiance 25 mg daily.   Labs reviewed: CBGS: 197-588 (inpatient orders for glycemic control are 25 mg jardiance daily, 0-20 units insulin aspart TID with meals, and 10 units insulin glargine daily at bedtime).   Pt NPO for cath today. Attempted to speak with pt, however, pt and visitor sleeping soundly at time of visit. Also attempted to call pt on hospital room phone, however, no answer.   Per DM coordinator notes, DM medications recently increased, however, has not changed to higher dose yet. He is followed by outpatinet endocrinology (Dr. Meredith Pel).   RD provided "Heart Healthy, Consistent Carbohydrate Nutrition Therapy" handout from the [employer] of [employer]. Attached to AVS/ discharge summary.   RD will refer to outpatient education ([facility]) for further reinforcement.   Current diet order is heart healthy, carb modified, patient is consuming approximately 100% of meals at this time. Labs and medications reviewed. No further nutrition interventions warranted at this time. RD contact information provided. If additional nutrition issues arise, please re-consult RD.   Loistine Chance, RD, LDN, [place] Registered Dietitian II Certified Diabetes Care and Education Specialist Please refer to [facility] for RD and/or RD on-call/weekend/after hours pager

## 2019-10-28 NOTE — Progress Notes (Signed)
Progress Note  Patient Name: [name] Date of Encounter: 10/28/2019  [facility] Cardiologist: Kirk Ruths, MD   Subjective   Breathing and edema improving. Very close to baseline.   Inpatient Medications    Scheduled Meds: . aspirin EC  81 mg Oral Daily  . atorvastatin  40 mg Oral Daily  . carvedilol  3.125 mg Oral BID WC  . empagliflozin  25 mg Oral Daily  . furosemide  80 mg Oral Daily  . insulin aspart  0-20 Units Subcutaneous TID WC  . insulin glargine  10 Units Subcutaneous QHS  . lidocaine  2 patch Transdermal Q24H  . nicotine  21 mg Transdermal Q0600  . potassium chloride  30 mEq Oral BID  . sacubitril-valsartan  1 tablet Oral BID  . sodium chloride flush  3 mL Intravenous Q12H  . sodium chloride flush  3 mL Intravenous Q12H   Continuous Infusions: . sodium chloride    . sodium chloride    . sodium chloride 10 mL/hr at 10/28/19 0740   PRN Meds: sodium chloride, sodium chloride, acetaminophen, ipratropium-albuterol, ondansetron (ZOFRAN) IV, sodium chloride flush, sodium chloride flush   Vital Signs    Vitals:   10/27/19 1449 10/27/19 1941 10/28/19 0037 10/28/19 0409  BP: (!) 146/68 (!) 150/70  (!) 148/68  Pulse: (!) 56 (!) 56  (!) 57  Resp: 18 18  18   Temp: 98.7 F (37.1 C) 98.6 F (37 C)  98.4 F (36.9 C)  TempSrc: Oral Oral  Oral  SpO2: 92% 96%  96%  Weight:   85 kg   Height:        Intake/Output Summary (Last 24 hours) at 10/28/2019 0826 Last data filed at 10/28/2019 0410 Gross per 24 hour  Intake 942 ml  Output 3150 ml  Net -2208 ml   Last 3 Weights 10/28/2019 10/27/2019 10/26/2019  Weight (lbs) 187 lb 6.4 oz 195 lb 9.6 oz 204 lb 8 oz  Weight (kg) 85.004 kg 88.724 kg 92.761 kg      Telemetry    NSR - Personally Reviewed  ECG    N/A  Physical Exam   GEN: No acute distress.   Neck: No JVD Cardiac: RRR, no murmurs, rubs, or gallops.  Respiratory: Clear to auscultation bilaterally. GI: Soft, nontender, non-distended  MS: 1+  BL LE  edema; No deformity. Neuro:  Nonfocal  Psych: Normal affect   Labs    High Sensitivity Troponin:   Recent Labs  Lab 10/24/19 2011 10/24/19 2218  TROPONINIHS 17 19*      Chemistry Recent Labs  Lab 10/25/19 1111 10/26/19 0142 10/27/19 0428 10/27/19 1700  NA 142 139 140  --   K 3.8 3.7 4.0  --   CL 104 102 101  --   CO2 28 27 25   --   GLUCOSE 126* 225* 160*  --   BUN 19 20 20   --   CREATININE 1.15 1.25* 1.03  --   CALCIUM 9.1 8.6* 9.0  --   PROT  --  6.1*  --  6.4*  ALBUMIN  --  3.2*  --   --   AST  --  27  --   --   ALT  --  26  --   --   ALKPHOS  --  106  --   --   BILITOT  --  1.2  --   --   GFRNONAA >60 >60 >60  --   GFRAA >60 >60 >60  --  ANIONGAP 10 10 14   --      Hematology Recent Labs  Lab 10/24/19 2011 10/27/19 0428  WBC 6.6 7.3  RBC 5.94* 6.24*  HGB 17.1* 17.6*  HCT 53.9* 56.2*  MCV 90.7 90.1  MCH 28.8 28.2  MCHC 31.7 31.3  RDW 15.4 15.7*  PLT 222 211    BNP Recent Labs  Lab 10/24/19 2012  BNP 1,913.6*     Radiology    DG Chest 2 View  Result Date: 10/27/2019 CLINICAL DATA:  Post right thoracentesis EXAM: CHEST - 2 VIEW COMPARISON:  10/24/2019 FINDINGS: Cardiomegaly with mild central congestion. Decreased right pleural effusion with small residual. No pneumothorax. Posterior lower lobe opacity on lateral view. Aortic atherosclerosis. IMPRESSION: 1. Decreased right pleural effusion with small residual. No pneumothorax. 2. Cardiomegaly with mild central congestion. 3. Posterior lung base opacity may reflect atelectasis, pneumonia, or mass. Short interval radiographic follow-up to clearing is recommended, alternatively chest CT could be considered. Electronically Signed   By: Donavan Foil M.D.   On: 10/27/2019 15:12   ECHOCARDIOGRAM LIMITED  Result Date: 10/26/2019    ECHOCARDIOGRAM LIMITED REPORT   Patient Name:   [name] Date of Exam: 10/26/2019 Medical Rec #:  [id number]         Height:       67.0 in Accession #:     [id number]        Weight:       204.5 lb Date of Birth:  1960/12/31         BSA:          2.041 m Patient Age:    59 years          BP:           162/80 mmHg Patient Gender: M                 HR:           76 bpm. Exam Location:  Inpatient Procedure: Intracardiac Opacification Agent and Limited Echo Indications:    Thrombus in heart chamber [[id number]]  History:        Patient has prior history of Echocardiogram examinations, most                 recent 10/25/2019. CAD, PAD; Risk Factors:Hypertension, Diabetes                 and Sleep Apnea.  Sonographer:    Darlina Sicilian RDCS Referring Phys: [place]  1. No formed LV mural thrombus noted with Definity contrast. FINDINGS  Left Ventricle: No formed LV mural thrombus noted with Definity contrast.   Rozann Lesches MD Electronically signed by Rozann Lesches MD Signature Date/Time: 10/26/2019/5:04:29 PM    Final    IR THORACENTESIS ASP PLEURAL SPACE W/IMG GUIDE  Result Date: 10/27/2019 INDICATION: Patient with history acute combined congestive heart failure and AKI presents for therapeutic and diagnostic right-sided thoracentesis EXAM: ULTRASOUND GUIDED THERAPEUTIC AND DIAGNOSTIC THORACENTESIS MEDICATIONS: Lidocaine 1% 10 mL COMPLICATIONS: None immediate. PROCEDURE: An ultrasound guided thoracentesis was thoroughly discussed with the patient and questions answered. The benefits, risks, alternatives and complications were also discussed. The patient understands and wishes to proceed with the procedure. Written consent was obtained. Ultrasound was performed to localize and mark an adequate pocket of fluid in the right sided chest. The area was then prepped and draped in the normal sterile fashion. 1% Lidocaine was used for local anesthesia. Under ultrasound guidance a 6 Fr  Safe-T-Centesis catheter was introduced. Thoracentesis was performed. The catheter was removed and a dressing applied. FINDINGS: A total of approximately 1 L of straw-colored fluid  was removed. Samples were sent to the laboratory as requested by the clinical team. IMPRESSION: Successful ultrasound guided therapeutic and diagnostic right-sided thoracentesis yielding 1 L of pleural fluid. Read by: Rushie Nyhan, NP Electronically Signed   By: Lucrezia Europe M.D.   On: 10/27/2019 15:30    Cardiac Studies   Limited Echo 10/26/19 1. No formed LV mural thrombus noted with Definity contrast.   FINDINGS  Left Ventricle: No formed LV mural thrombus noted with Definity contrast.   Echo 10/25/19 1. Left ventricular ejection fraction, by estimation, is 30 to 35%. The  left ventricle has moderately decreased function. The left ventricle  demonstrates regional wall motion abnormalities (see scoring  diagram/findings for description). There is  moderate left ventricular hypertrophy. Left ventricular diastolic  parameters are consistent with Grade III diastolic dysfunction  (restrictive). Suggest Definity contrast study to more effectively exclude  apical thrombus - acoustic shadowing and false  tendon noted.  2. Right ventricular systolic function is mildly reduced. The right  ventricular size is normal. There is moderately elevated pulmonary artery  systolic pressure. The estimated right ventricular systolic pressure is  72.5 mmHg.  3. Left atrial size was mild to moderately dilated.  4. Right atrial size was mild to moderately dilated.  5. The mitral valve is abnormal, mildly thickened and with annular  calcification. Mild mitral valve regurgitation.  6. The tricuspid valve is abnormal.  7. The aortic valve is tricuspid. Aortic valve regurgitation is trivial.  8. The inferior vena cava is dilated in size with <50% respiratory  variability, suggesting right atrial pressure of 15 mmHg.   Patient Profile     [name] is a 59 y.o. male with a hx of CAD, (acute MI 2005 with PTCA to Diag, then stent to LCX in 2007 , last cath 2008 with 20% LM, 50% LAD and  70%mLAD, ? Hx of EF 29% on myoview in 2013 but echo in 2013 with normal EF (60-65%), HTN, DM-2, HLD, + tobacco, chronic pain , PAD  who is being seen  for the evaluation of cardiomyopathy at the request of Dr. Rebeca Alert.  Assessment & Plan    1. Acute combined CHF - Echo this admission showed LVEF of 30-35% and grade III DD (EF was 60-65% in 2013). No LV thrombus on limited echo.  - Diuresed > 13L - Weight down 26 lbs - Heart failure education given. Will get nutritional consult - For Left and right cath today - s/p right thoracentesis yesterday with removal of 1 liter of fluid. Looks transudative.  - will change lasix 80mg  qd starting today - Started Entresto and Coreg  - add aldactone 12.5 mg daily. Follow BMET - Add TED hose  2. AKi - Resolved - Followed closely - monitor with change in meds.   3. HTN - controlled.  - Added Coreg and Entresto today  4. CAD - Continue ASA and statin - No chest pain - Cath tomorrow  5. HLD - 10/27/2019: Cholesterol 135; HDL 34; LDL Cholesterol 83; Triglycerides 89; VLDL 18  - Increase Lipitor to 40mg  qd  6. DM - Per primary team   7. Tobacco abuse - Recommended cessation - On nicotine patch  8. PAD - Followed by vascular - On ASA and statin   For questions or updates, please contact [place] Please consult [email or web address]  for contact info under        Signed, Jeovanni Heuring [place], MD  10/28/2019, 8:26 AM

## 2019-10-28 NOTE — Interval H&P Note (Signed)
History and Physical Interval Note:  10/28/2019 4:11 PM  [name]  has presented today for surgery, with the diagnosis of congestive heart failure.  The various methods of treatment have been discussed with the patient and family. After consideration of risks, benefits and other options for treatment, the patient has consented to  Procedure(s): RIGHT/LEFT HEART CATH AND CORONARY ANGIOGRAPHY (N/A) as a surgical intervention.  The patient's history has been reviewed, patient examined, no change in status, stable for surgery.  I have reviewed the patient's chart and labs.  Questions were answered to the patient's satisfaction.    Cath Lab Visit (complete for each Cath Lab visit)  Clinical Evaluation Leading to the Procedure:   ACS: No.  Non-ACS:    Anginal Classification: CCS II  Anti-ischemic medical therapy: Minimal Therapy (1 class of medications)  Non-Invasive Test Results: No non-invasive testing performed  Prior CABG: No previous CABG        [name]

## 2019-10-28 NOTE — Plan of Care (Signed)
  Problem: Activity: Goal: Capacity to carry out activities will improve Outcome: Progressing   Problem: Cardiac: Goal: Ability to achieve and maintain adequate cardiopulmonary perfusion will improve Outcome: Not Met (add Reason) Pt. Scheduled for cardiac surgery

## 2019-10-28 NOTE — Progress Notes (Signed)
TCTS consulted for CABG evaluation. °

## 2019-10-28 NOTE — Hospital Course (Signed)
Acute decompensated heart failure - [name] is a 59 yo male with PMH of CAD s/p stents, type 2 DM, OSA on CPAP, and HTN, admitted to the hospital for bilateral extremity edema and dyspnea, found to have acute heart failure with reduced EF. Echocardiogram shows EF 30-35% with regional wall abnormality. Grade III diastolic dysfunction. R ventricular systolic function reduced. He was diuresed with Lasix 80 mg BID and volume status improved significantly. Patient was started on Coreg, Entresto and Spironolactone. Right and Left heart cath shows ***  Pleural Effusion  - CXR shows moderate right pleural effusion on admission.Thoracentesis drew 1 L of fluid, transudate per Light's criteria, which consistent with his HF. Patient's breathing improves after the thoracentesis.   Type 2 DM - A1C of 10.6. Patient was on 10 units Lantus daily, Jardiance and sliding scale insulin. Encourage patient to follow up with PCP when discharged. He will continue Metformin, Jardiance and Trulicity when discharged. Patient may need additional Insulin given his A1C.   Acute Kidney Injury - AKI likely secondary to renal congestion from heart failure. Creatine trending back to baseline after diuresis with Lasix.

## 2019-10-28 NOTE — Progress Notes (Addendum)
Pt refusing CPAP for tonight, as well as states he doesn't need O2 while sleeping. SpO2 was around 89% when RT entered pts room, but he states he is okay. Pt has refused CPAP multiple times this admission, so order is being discontinued per RT protocol. Pt aware that he can still request CPAP if he changes his mind. RT Will continue to monitor.

## 2019-10-28 NOTE — Progress Notes (Signed)
PT Cancellation Note  Patient Details Name: [name] MRN: [id number] DOB: Nov 24, 1960   Cancelled Treatment:    Reason Eval/Treat Not Completed: Patient at procedure or test/unavailable Pt NPO and was supposed to go for cardiac cath around 10 (now 1130).  Pt declined PT at this time.  He reports he has been up and ambulating in room.  Will f/u as able.  Abran Richard, PT Acute Rehab Services Pager [phone] [facility] 10/28/2019, 12:07 PM

## 2019-10-28 NOTE — Discharge Instructions (Addendum)
[facility] Surgeon Office [phone]   Coronary Artery Bypass Grafting, Care After This sheet gives you information about how to care for yourself after your procedure. Your doctor may also give you more specific instructions. If you have problems or questions, call your doctor. What can I expect after the procedure? After the procedure, it is common to:  Feel sick to your stomach (nauseous).  Not want to eat as much as normal (lack of appetite).  Have trouble pooping (constipation).  Have weakness and tiredness (fatigue).  Feel sad (depressed) or grouchy (irritable).  Have pain or discomfort around the cuts from surgery (incisions). Follow these instructions at home: Medicines  Take over-the-counter and prescription medicines only as told by your doctor. Do not stop taking medicines or start any new medicines unless your doctor says it is okay.  If you were prescribed an antibiotic medicine, take it as told by your doctor. Do not stop taking the antibiotic even if you start to feel better. Incision care   Follow instructions from your doctor about how to take care of your cuts from surgery. Make sure you: ? Wash your hands with soap and water before and after you change your bandage (dressing). If you cannot use soap and water, use hand sanitizer. ? Change your bandage as told by your doctor. ? Leave stitches (sutures), skin glue, or skin tape (adhesive) strips in place. They may need to stay in place for 2 weeks or longer. If tape strips get loose and curl up, you may trim the loose edges. Do not remove tape strips completely unless your doctor says it is okay.  Make sure the surgery cuts are clean, dry, and protected.  Check your cut areas every day for signs of infection. Check for: ? More redness, swelling, or pain. ? More fluid or blood. ? Warmth. ? Pus or a bad smell.  If cuts were made in your legs: ? Avoid crossing your legs. ? Avoid sitting for long periods of time.  Change positions every 30 minutes. ? Raise (elevate) your legs when you are sitting. Bathing  Do not take baths, swim, or use a hot tub until your doctor says it is okay.  You may shower. Pat the surgery cuts dry. Do not rub the cuts to dry.  Eating and drinking   Eat foods that are high in fiber, such as beans, nuts, whole grains, and raw fruits and vegetables. Any meats you eat should be lean cut. Avoid canned, processed, and fried foods. This can help prevent trouble pooping. This is also a part of a heart-healthy diet.  Drink enough fluid to keep your pee (urine) pale yellow.  Do not drink alcohol until you are fully recovered. Ask your doctor when it is safe to drink alcohol. Activity  Rest and limit your activity as told by your doctor. You may be told to: ? Stop any activity right away if you have chest pain, shortness of breath, irregular heartbeats, or dizziness. Get help right away if you have any of these symptoms. ? Move around often for short periods or take short walks as told by your doctor. Slowly increase your activities. ? Avoid lifting, pushing, or pulling anything that is heavier than 10 lb (4.5 kg) for at least 6 weeks or as told by your doctor.  Do physical therapy or a cardiac rehab (cardiac rehabilitation) program as told by your doctor. ? Physical therapy involves doing exercises to maintain movement and build strength and endurance. ?  A cardiac rehab program includes:  Exercise training.  Education.  Counseling.  Do not drive until your doctor says it is okay.  Ask your doctor when you can go back to work.  Ask your doctor when you can be sexually active. General instructions  Do not drive or use heavy machinery while taking prescription pain medicine.  Do not use any products that contain nicotine or tobacco. These include cigarettes, e-cigarettes, and chewing tobacco. If you need help quitting, ask your doctor.  Take 2-3 deep breaths every few  hours during the day while you get better. This helps expand your lungs and prevent problems.  If you were given a device called an incentive spirometer, use it several times a day to practice deep breathing. Support your chest with a pillow or your arms when you take deep breaths or cough.  Wear compression stockings as told by your doctor.  Weigh yourself every day. This helps to see if your body is holding (retaining) fluid that may make your heart and lungs work harder.  Keep all follow-up visits as told by your doctor. This is important. Contact a doctor if:  You have more redness, swelling, or pain around any cut.  You have more fluid or blood coming from any cut.  Any cut feels warm to the touch.  You have pus or a bad smell coming from any cut.  You have a fever.  You have swelling in your ankles or legs.  You have pain in your legs.  You gain 2 lb (0.9 kg) or more a day.  You feel sick to your stomach or you throw up (vomit).  You have watery poop (diarrhea). Get help right away if:  You have chest pain that goes to your jaw or arms.  You are short of breath.  You have a fast or irregular heartbeat.  You notice a "clicking" in your breastbone (sternum) when you move.  You have any signs of a stroke. "BE FAST" is an easy way to remember the main warning signs: ? B - Balance. Signs are dizziness, sudden trouble walking, or loss of balance. ? E - Eyes. Signs are trouble seeing or a change in how you see. ? F - Face. Signs are sudden weakness or loss of feeling of the face, or the face or eyelid drooping on one side. ? A - Arms. Signs are weakness or loss of feeling in an arm. This happens suddenly and usually on one side of the body. ? S - Speech. Signs are sudden trouble speaking, slurred speech, or trouble understanding what people say. ? T - Time. Time to call emergency services. Write down what time symptoms started.  You have other signs of a stroke, such  as: ? A sudden, very bad headache with no known cause. ? Feeling sick to your stomach. ? Throwing up. ? Jerky movements you cannot control (seizure). These symptoms may be an emergency. Do not wait to see if the symptoms will go away. Get medical help right away. Call your local emergency services (911 in the [place] Do not drive yourself to the hospital. Summary  After the procedure, it is common to have pain or discomfort in the cuts from surgery (incisions).  Do not take baths, swim, or use a hot tub until your doctor says it is okay.  Slowly increase your activities. You may need physical therapy or cardiac rehab.  Weigh yourself every day. This helps to see if your body is  holding fluid. This information is not intended to replace advice given to you by your health care provider. Make sure you discuss any questions you have with your health care provider. Document Revised: 12/17/2017 Document Reviewed: 12/17/2017 Elsevier Patient Education  2020 [place], Consistent Carbohydrate Nutrition Therapy   A heart-healthy and consistent carbohydrate diet is recommended to manage heart disease and diabetes. To follow a heart-healthy and consistent carbohydrate diet, . Eat a balanced diet with whole grains, fruits and vegetables, and lean protein sources.  . Choose heart-healthy unsaturated fats. Limit saturated fats, trans fats, and cholesterol intake. Eat more plant-based or vegetarian meals using beans and soy foods for protein.  . Eat whole, unprocessed foods to limit the amount of sodium (salt) you eat.  . Choose a consistent amount of carbohydrate at each meal and snack. Limit refined carbohydrates especially sugar, sweets and sugar-sweetened beverages.  . If you drink alcohol, do so in moderation: one serving per day (women) and two servings per day (men). o One serving is equivalent to 12 ounces beer, 5 ounces wine, or 1.5 ounces distilled spirits  Tips Tips for  Choosing Heart-Healthy Fats Choose lean protein and low-fat dairy foods to reduce saturated fat intake. . Saturated fat is usually found in animal-based protein and is associated with certain health risks. Saturated fat is the biggest contributor to raise low-density lipoprotein (LDL) cholesterol levels. Research shows that limiting saturated fat lowers unhealthy cholesterol levels. Eat no more than 7% of your total calories each day from saturated fat. Ask your RDN to help you determine how much saturated fat is right for you. . There are many foods that do not contain large amounts of saturated fats. Swapping these foods to replace foods high in saturated fats will help you limit the saturated fat you eat and improve your cholesterol levels. You can also try eating more plant-based or vegetarian meals. Instead of. Try:  Whole milk, cheese, yogurt, and ice cream 1% or skim milk, low-fat cheese, non-fat yogurt, and low-fat ice cream  Fatty, marbled beef and pork Lean beef, pork, or venison  Poultry with skin Poultry without skin  Butter, stick margarine Reduced-fat, whipped, or liquid spreads  Coconut oil, palm oil Liquid vegetable oils: corn, canola, olive, soybean and safflower oils   Avoid foods that contain trans fats. . Trans fats increase levels of LDL-cholesterol. Hydrogenated fat in processed foods is the main source of trans fats in foods.  . Trans fats can be found in stick margarine, shortening, processed sweets, baked goods, some fried foods, and packaged foods made with hydrogenated oils. Avoid foods with "partially hydrogenated oil" on the ingredient list such as: cookies, pastries, baked goods, biscuits, crackers, microwave popcorn, and frozen dinners. Choose foods with heart healthy fats. . Polyunsaturated and monounsaturated fat are unsaturated fats that may help lower your blood cholesterol level when used in place of saturated fat in your diet. . Ask your RDN about taking a dietary  supplement with plant sterols and stanols to help lower your cholesterol level. [name] Kitchen Research shows that substituting saturated fats with unsaturated fats is beneficial to cholesterol levels. Try these easy swaps: Instead of. Try:  Butter, stick margarine, or solid shortening Reduced-fat, whipped, or liquid spreads  Beef, pork, or poultry with skin Fish and seafood  Chips, crackers, snack foods Raw or unsalted nuts and seeds or nut butters Hummus with vegetables Avocado on toast  Coconut oil, palm oil Liquid vegetable oils: corn, canola, olive, soybean and safflower  oils  Limit the amount of cholesterol you eat to less than 200 milligrams per day. . Cholesterol is a substance carried through the bloodstream via lipoproteins, which are known as "transporters" of fat. Some body functions need cholesterol to work properly, but too much cholesterol in the bloodstream can damage arteries and build up blood vessel linings (which can lead to heart attack and stroke). You should eat less than 200 milligrams cholesterol per day. [name] Kitchen People respond differently to eating cholesterol. There is no test available right now that can figure out which people will respond more to dietary cholesterol and which will respond less. For individuals with high intake of dietary cholesterol, different types of increase (none, small, moderate, large) in LDL-cholesterol levels are all possible.  . Food sources of cholesterol include egg yolks and organ meats such as liver, gizzards. Limit egg yolks to two to four per week and avoid organ meats like liver and gizzards to control cholesterol intake. Tips for Choosing Heart-Healthy Carbohydrates Consume a consistent amount of carbohydrate . It is important to eat foods with carbohydrates in moderation because they impact your blood glucose level. Carbohydrates can be found in many foods such as: . Grains (breads, crackers, rice, pasta, and cereals)  . Starchy Vegetables (potatoes,  corn, and peas)  . Beans and legumes  . Milk, soy milk, and yogurt  . Fruit and fruit juice  . Sweets (cakes, cookies, ice cream, jam and jelly) . Your RDN will help you set a goal for how many carbohydrate servings to eat at your meals and snacks. For many adults, eating 3 to 5 servings of carbohydrate foods at each meal and 1 or 2 carbohydrate servings for each snack works well.  . Check your blood glucose level regularly. It can tell you if you need to adjust when you eat carbohydrates. . Choose foods rich in viscous (soluble) fiber . Viscous, or soluble, is found in the walls of plant cells. Viscous fiber is found only in plant-based foods. Eating foods with fiber helps to lower your unhealthy cholesterol and keep your blood glucose in range  . Rich sources of viscous fiber include vegetables (asparagus, Brussels sprouts, sweet potatoes, turnips) fruit (apricots, mangoes, oranges), legumes, and whole grains (barley, oats, and oat bran).  . As you increase your fiber intake gradually, also increase the amount of water you drink. This will help prevent constipation.  . If you have difficulty achieving this goal, ask your RDN about fiber laxatives. Choose fiber supplements made with viscous fibers such as psyllium seed husks or methylcellulose to help lower unhealthy cholesterol.  . Limit refined carbohydrates  . There are three types of carbohydrates: starches, sugar, and fiber. Some carbohydrates occur naturally in food, like the starches in rice or corn or the sugars in fruits and milk. Refined carbohydrates--foods with high amounts of simple sugars--can raise triglyceride levels. High triglyceride levels are associated with coronary heart disease. . Some examples of refined carbohydrate foods are table sugar, sweets, and beverages sweetened with added sugar. Tips for Reducing Sodium (Salt) Although sodium is important for your body to function, too much sodium can be harmful for people with high  blood pressure. As sodium and fluid buildup in your tissues and bloodstream, your blood pressure increases. High blood pressure may cause damage to other organs and increase your risk for a stroke. Even if you take a pill for blood pressure or a water pill (diuretic) to remove fluid, it is still important to have less  salt in your diet. Ask your doctor and RDN what amount of sodium is right for you. [name] Kitchen Avoid processed foods. Eat more fresh foods.  . Fresh fruits and vegetables are naturally low in sodium, as well as frozen vegetables and fruits that have no added juices or sauces.  . Fresh meats are lower in sodium than processed meats, such as bacon, sausage, and hotdogs. Read the nutrition label or ask your butcher to help you find a fresh meat that is low in sodium. . Eat less salt--at the table and when cooking.  . A single teaspoon of table salt has 2,300 mg of sodium.  . Leave the salt out of recipes for pasta, casseroles, and soups.  . Ask your RDN how to cook your favorite recipes without sodium . Be a Paramedic.  . Look for food packages that say "salt-free" or "sodium-free." These items contain less than 5 milligrams of sodium per serving.  [name] Kitchen "Very low-sodium" products contain less than 35 milligrams of sodium per serving.  [name] Kitchen "Low-sodium" products contain less than 140 milligrams of sodium per serving.  . Beware for "Unsalted" or "No Added Salt" products. These items may still be high in sodium. Check the nutrition label. . Add flavors to your food without adding sodium.  . Try lemon juice, lime juice, fruit juice or vinegar.  . Dry or fresh herbs add flavor. Try basil, bay leaf, dill, rosemary, parsley, sage, dry mustard, nutmeg, thyme, and paprika.  . Pepper, red pepper flakes, and cayenne pepper can add spice t your meals without adding sodium. Hot sauce contains sodium, but if you use just a drop or two, it will not add up to much.  Sharyn Lull a sodium-free seasoning blend or make your  own at home. Additional Lifestyle Tips Achieve and maintain a healthy weight. . Talk with your RDN or your doctor about what is a healthy weight for you. . Set goals to reach and maintain that weight.  . To lose weight, reduce your calorie intake along with increasing your physical activity. A weight loss of 10 to 15 pounds could reduce LDL-cholesterol by 5 milligrams per deciliter. Participate in physical activity. . Talk with your health care team to find out what types of physical activity are best for you. Set a plan to get about 30 minutes of exercise on most days.  Foods Recommended Food Group Foods Recommended  Grains Whole grain breads and cereals, including whole wheat, barley, rye, buckwheat, corn, teff, quinoa, millet, amaranth, brown or wild rice, sorghum, and oats Pasta, especially whole wheat or other whole grain types  [employer], quinoa or wild rice Whole grain crackers, bread, rolls, pitas Home-made bread with reduced-sodium baking soda  Protein Foods Lean cuts of beef and pork (loin, leg, round, extra lean hamburger)  Skinless Cytogeneticist and other wild game Dried beans and peas Nuts and nut butters Meat alternatives made with soy or textured vegetable protein  Egg whites or egg substitute Cold cuts made with lean meat or soy protein  Dairy Nonfat (skim), low-fat, or 1%-fat milk  Nonfat or low-fat yogurt or cottage cheese Fat-free and low-fat cheese  Vegetables Fresh, frozen, or canned vegetables without added fat or salt   Fruits Fresh, frozen, canned, or dried fruit   Oils Unsaturated oils (corn, olive, peanut, soy, sunflower, canola)  Soft or liquid margarines and vegetable oil spreads  Salad dressings Seeds and nuts  Avocado   Foods Not Recommended Food Group Foods Not  Recommended  Grains Breads or crackers topped with salt Cereals (hot or cold) with more than 300 mg sodium per serving Biscuits, cornbread, and other "quick" breads prepared with  baking soda Bread crumbs or stuffing mix from a store High-fat bakery products, such as doughnuts, biscuits, croissants, danish pastries, pies, cookies Instant cooking foods to which you add hot water and stir--potatoes, noodles, rice, etc. Packaged starchy foods--seasoned noodle or rice dishes, stuffing mix, macaroni and cheese dinner Snacks made with partially hydrogenated oils, including chips, cheese puffs, snack mixes, regular crackers, butter-flavored popcorn  Protein Foods Higher-fat cuts of meats (ribs, t-bone steak, regular hamburger) Bacon, sausage, or hot dogs Cold cuts, such as salami or bologna, deli meats, cured meats, corned beef Organ meats (liver, brains, gizzards, sweetbreads) Poultry with skin Fried or smoked meat, poultry, and fish Whole eggs and egg yolks (more than 2-4 per week) Salted legumes, nuts, seeds, or nut/seed butters Meat alternatives with high levels of sodium (>300 mg per serving) or saturated fat (>5 g per serving)  Dairy Whole milk,?2% fat milk, buttermilk Whole milk yogurt or ice cream Cream Half-&-half Cream cheese Sour cream Cheese  Vegetables Canned or frozen vegetables with salt, fresh vegetables prepared with salt, butter, cheese, or cream sauce Fried vegetables Pickled vegetables such as olives, pickles, or sauerkraut  Fruits Fried fruits Fruits served with butter or cream  Oils Butter, stick margarine, shortening Partially hydrogenated oils or trans fats Tropical oils (coconut, palm, palm kernel oils)  Other Candy, sugar sweetened soft drinks and desserts Salt, sea salt, garlic salt, and seasoning mixes containing salt Bouillon cubes Ketchup, barbecue sauce, Worcestershire sauce, soy sauce, teriyaki sauce Miso Salsa Pickles, olives, relish   Heart Healthy Consistent Carbohydrate Vegetarian (Lacto-Ovo) Sample 1-Day Menu  Breakfast 1 cup oatmeal, cooked (2 carbohydrate servings)   cup blueberries (1 carbohydrate serving)  11 almonds,  without salt  1 cup 1% milk (1 carbohydrate serving)  1 cup coffee  Morning Snack 1 cup fat-free plain yogurt (1 carbohydrate serving)  Lunch 1 whole wheat bun (1 carbohydrate servings)  1 black bean burger (1 carbohydrate servings)  1 slice cheddar cheese, low sodium  2 slices tomatoes  2 leaves lettuce  1 teaspoon mustard  1 small pear (1 carbohydrate servings)  1 cup green tea, unsweetened  Afternoon Snack 1/3 cup trail mix with nuts, seeds, and raisins, without salt (1 carbohydrate servinga)  Evening Meal  cup meatless chicken  2/3 cup brown rice, cooked (2 carbohydrate servings)  1 cup broccoli, cooked (2/3 carbohydrate serving)   cup carrots, cooked (1/3 carbohydrate serving)  2 teaspoons olive oil  1 teaspoon balsamic vinegar  1 whole wheat dinner roll (1 carbohydrate serving)  1 teaspoon margarine, soft, tub  1 cup 1% milk (1 carbohydrate serving)  Evening Snack 1 extra small banana (1 carbohydrate serving)  1 tablespoon peanut butter   Heart Healthy Consistent Carbohydrate Vegan Sample 1-Day Menu  Breakfast 1 cup oatmeal, cooked (2 carbohydrate servings)   cup blueberries (1 carbohydrate serving)  11 almonds, without salt  1 cup soymilk fortified with calcium, vitamin B12, and vitamin D  1 cup coffee  Morning Snack 6 ounces soy yogurt (1 carbohydrate servings)  Lunch 1 whole wheat bun(1 carbohydrate servings)  1 black bean burger (1 carbohydrate serving)  2 slices tomatoes  2 leaves lettuce  1 teaspoon mustard  1 small pear (1 carbohydrate servings)  1 cup green tea, unsweetened  Afternoon Snack 1/3 cup trail mix with nuts, seeds, and raisins,  without salt (1 carbohydrate servings)  Evening Meal  cup meatless chicken  2/3 cup brown rice, cooked (2 carbohydrate servings)  1 cup broccoli, cooked (2/3 carbohydrate serving)   cup carrots, cooked (1/3 carbohydrate serving)  2 teaspoons olive oil  1 teaspoon balsamic vinegar  1 whole wheat dinner roll (1  carbohydrate serving)  1 teaspoon margarine, soft, tub  1 cup soymilk fortified with calcium, vitamin B12, and vitamin D  Evening Snack 1 extra small banana (1 carbohydrate serving)  1 tablespoon peanut butter    Heart Healthy Consistent Carbohydrate Sample 1-Day Menu  Breakfast 1 cup cooked oatmeal (2 carbohydrate servings)  3/4 cup blueberries (1 carbohydrate serving)  1 ounce almonds  1 cup skim milk (1 carbohydrate serving)  1 cup coffee  Morning Snack 1 cup sugar-free nonfat yogurt (1 carbohydrate serving)  Lunch 2 slices whole-wheat bread (2 carbohydrate servings)  2 ounces lean [place] breast  1 ounce low-fat Swiss cheese  1 teaspoon mustard  1 slice tomato  1 lettuce leaf  1 small pear (1 carbohydrate serving)  1 cup skim milk (1 carbohydrate serving)  Afternoon Snack 1 ounce trail mix with unsalted nuts, seeds, and raisins (1 carbohydrate serving)  Evening Meal 3 ounces salmon  2/3 cup cooked brown rice (2 carbohydrate servings)  1 teaspoon soft margarine  1 cup cooked broccoli with 1/2 cup cooked carrots (1 carbohydrate serving  Carrots, cooked, boiled, drained, without salt  1 cup lettuce  1 teaspoon olive oil with vinegar for dressing  1 small whole grain roll (1 carbohydrate serving)  1 teaspoon soft margarine  1 cup unsweetened tea  Evening Snack 1 extra-small banana (1 carbohydrate serving)  Copyright 2020  [employer]y [employer]. All rights reserved.  Information on my medicine - ELIQUIS (apixaban)  This medication education was reviewed with me or my healthcare representative as part of my discharge preparation.  The pharmacist that spoke with me during my hospital stay was:  Georgina Peer, [facility]  Why was Eliquis prescribed for you? Eliquis was prescribed for you to reduce the risk of a blood clot forming that can cause a stroke if you have a medical condition called atrial fibrillation (a type of irregular heartbeat).  What do You need to  know about Eliquis ? Take your Eliquis TWICE DAILY - one tablet in the morning and one tablet in the evening with or without food. If you have difficulty swallowing the tablet whole please discuss with your pharmacist how to take the medication safely.  Take Eliquis exactly as prescribed by your doctor and DO NOT stop taking Eliquis without talking to the doctor who prescribed the medication.  Stopping may increase your risk of developing a stroke.  Refill your prescription before you run out.  After discharge, you should have regular check-up appointments with your healthcare provider that is prescribing your Eliquis.  In the future your dose may need to be changed if your kidney function or weight changes by a significant amount or as you get older.  What do you do if you miss a dose? If you miss a dose, take it as soon as you remember on the same day and resume taking twice daily.  Do not take more than one dose of ELIQUIS at the same time to make up a missed dose.  Important Safety Information A possible side effect of Eliquis is bleeding. You should call your healthcare provider right away if you experience any of the following: ?  Bleeding from an injury or your nose that does not stop. ? Unusual colored urine (red or dark brown) or unusual colored stools (red or black). ? Unusual bruising for unknown reasons. ? A serious fall or if you hit your head (even if there is no bleeding).  Some medicines may interact with Eliquis and might increase your risk of bleeding or clotting while on Eliquis. To help avoid this, consult your healthcare provider or pharmacist prior to using any new prescription or non-prescription medications, including herbals, vitamins, non-steroidal anti-inflammatory drugs (NSAIDs) and supplements.  This website has more information on Eliquis (apixaban): http://www.eliquis.com/eliquis/home

## 2019-10-28 NOTE — Research (Signed)
[place] Informed Consent   Subject Name: [name]  Subject met inclusion and exclusion criteria.  The informed consent form, study requirements and expectations were reviewed with the subject and questions and concerns were addressed prior to the signing of the consent form.  The subject verbalized understanding of the trail requirements.  The subject agreed to participate in the [facility] trial and signed the informed consent.  The informed consent was obtained prior to performance of any protocol-specific procedures for the subject.  A copy of the signed informed consent was given to the subject and a copy was placed in the subject's medical record.  Philemon Kingdom D 10/28/2019, 8:28 AM

## 2019-10-28 NOTE — TOC Benefit Eligibility Note (Signed)
Transition of Care [facility]) Benefit Eligibility Note    Patient Details  Name: HEYDEN JABER MRN: [id number] Date of Birth: [birth year]   Medication/Dose: Delene Loll 49-51 MG BID                       Additional Notes: PRIMARY INS: [place] MEDICAID  [place]   EFF-DATE : 10-22-2019    CO-PAY- $4.00 FOR EACH PRESCRIPTION    Memory Argue Phone Number: 10/28/2019, 10:16 AM

## 2019-10-28 NOTE — Progress Notes (Signed)
Subjective:  Patient examined at bedside. He states that his breathing feels "better than I have in years" after the thoracentesis. No longer requiring oxygen. Patient denies chest pain. He also states that his abdominal distention has improved greatly.    We discussed plan for L and R heart cath today, patient agreeable.    Objective:  Vital signs in last 24 hours: Vitals:   10/27/19 1449 10/27/19 1941 10/28/19 0037 10/28/19 0409  BP: (!) 146/68 (!) 150/70  (!) 148/68  Pulse: (!) 56 (!) 56  (!) 57  Resp: 18 18  18   Temp: 98.7 F (37.1 C) 98.6 F (37 C)  98.4 F (36.9 C)  TempSrc: Oral Oral  Oral  SpO2: 92% 96%  96%  Weight:   85 kg   Height:        Physical Exam  Physical Exam Constitutional:      General: He is not in acute distress. HENT:     Head: Normocephalic.  Cardiovascular:     Rate and Rhythm: Normal rate and regular rhythm.     Heart sounds: No murmur heard.      Comments: No JVP visible in neck when sitting Pulmonary:     Breath sounds: Normal breath sounds.  Chest:     Chest wall: There is no dullness to percussion.  Abdominal:     General: Bowel sounds are normal.     Palpations: Abdomen is soft.     Comments: Less distended  Musculoskeletal:     Right lower leg: Edema present.     Left lower leg: Edema present.     Comments: +1 edema bilateral LE  Skin:    General: Skin is warm.  Neurological:     Mental Status: He is alert.  Psychiatric:        Mood and Affect: Mood normal.     Assessment/Plan: BRITTEN SEYFRIED is a 59 yo male with PMH of CAD s/p stents, type 2 DM, OSA on CPAP, and HTN, admitted to the hospital for bilateral extremity edema and dyspnea, found to have acute heart failure with reduced EF.  Principal Problem:   Acute congestive heart failure (HCC) Active Problems:   CAD (coronary artery disease)   Tobacco abuse   Hypertension   Hyperlipidemia   Diabetes mellitus (HCC)   Pleural  effusion  Acutedecompensatedheart failure  - Echo: EF 30-35% with regional wall abnormality. Grade III diastolic dysfunction. R ventricular systolic function reduced. Patient has no diagnosis ofheart failure in the past.Given h/o CAD and MI, likely ischemic in origin. - Patient produced 3L of fluid output in the last 24h. Wt drop 187 (dry wt 185) - Cardiology is on board with the treatment plan. RHC/LHC planned today. - Continue Entresto and Coreg 3.125 mg  - Added Spironolactone 12.5 mg daily. Follow BMP daily - Switch to Lasix 80 mg qd PO. Strict I/O. Daily wt - Continue Atorvastatin 40 mg goal LDL < 70 - Continue cardiac monitoring  PleuralEffusion  - CXR shows moderate right pleural effusion .  -Thoracentesis drew 1 L of fluid. Transudate per Light's criteria, which consistent with his HF - Continue diuresis with Lasix   HTN - Continue Coreg 3.125 mg, Entresto, and Spironolactone  - Continue to monitor vitals  Type 2 DM - A1C 10.6 - Continue Jardiance, 10 units Lantus qd and Novolog SSC TID  - Continue to monitor blood glucose - Patient reports taking Trulicity at home. Will continue Metformin, Jardiance and Trulicity when  discharged. Encourage patient to follow up with PCP when discharged.   AKI-resolved - Likely secondary to renal congestion  - Baseline Creatine 0.8. - Continue diuresis  - Daily BMP   CAD - Continue ASA and Lipitor  OSA - Continue CPAP  Tobacco cessation - Continue Nicotine patch  VZD:GLOVF healthy diet VTE ppx:Subcutaneous Lovenox IVF: none CODE STATUS:Full code  Prior to Admission Living Arrangement:home Anticipated Discharge Location:home Barriers to Discharge:hypervolemia, needs additional workup of HFrEF (RHC/LHC) Dispo: Anticipated discharge in approximately1-2day(s).   Gaylan Gerold, DO 10/28/2019, 8:21 AM Pager: [phone] After 5pm on weekdays and 1pm on weekends: On Call pager [phone]

## 2019-10-29 ENCOUNTER — Inpatient Hospital Stay (HOSPITAL_COMMUNITY)
Admission: EM | Disposition: A | Discharge status: Home / Self Care | Payer: Self-pay | Source: Home / Self Care | Attending: Cardiothoracic Surgery

## 2019-10-29 ENCOUNTER — Inpatient Hospital Stay (HOSPITAL_COMMUNITY): Payer: Medicaid Other | Admitting: Certified Registered Nurse Anesthetist

## 2019-10-29 ENCOUNTER — Inpatient Hospital Stay (HOSPITAL_COMMUNITY): Payer: Medicaid Other

## 2019-10-29 ENCOUNTER — Encounter (HOSPITAL_COMMUNITY): Payer: Self-pay | Admitting: Cardiovascular Disease

## 2019-10-29 DIAGNOSIS — I509 Heart failure, unspecified: Secondary | ICD-10-CM

## 2019-10-29 DIAGNOSIS — I2510 Atherosclerotic heart disease of native coronary artery without angina pectoris: Secondary | ICD-10-CM

## 2019-10-29 DIAGNOSIS — Z01810 Encounter for preprocedural cardiovascular examination: Secondary | ICD-10-CM

## 2019-10-29 DIAGNOSIS — I5041 Acute combined systolic (congestive) and diastolic (congestive) heart failure: Secondary | ICD-10-CM

## 2019-10-29 HISTORY — PX: CORONARY ARTERY BYPASS GRAFT: SHX141

## 2019-10-29 HISTORY — PX: TEE WITHOUT CARDIOVERSION: SHX5443

## 2019-10-29 HISTORY — PX: RADIAL ARTERY HARVEST: SHX5067

## 2019-10-29 LAB — POCT I-STAT 7, (LYTES, BLD GAS, ICA,H+H)
Acid-Base Excess: 1 mmol/L (ref 0.0–2.0)
Acid-Base Excess: 1 mmol/L (ref 0.0–2.0)
Acid-base deficit: 3 mmol/L — ABNORMAL HIGH (ref 0.0–2.0)
Bicarbonate: 27.5 mmol/L (ref 20.0–28.0)
Bicarbonate: 24.7 mmol/L (ref 20.0–28.0)
Bicarbonate: 25 mmol/L (ref 20.0–28.0)
Calcium, Ion: 1.09 mmol/L — ABNORMAL LOW (ref 1.15–1.40)
Calcium, Ion: 1.04 mmol/L — ABNORMAL LOW (ref 1.15–1.40)
Calcium, Ion: 1.24 mmol/L (ref 1.15–1.40)
HCT: 46 % (ref 39.0–52.0)
HCT: 46 % (ref 39.0–52.0)
HCT: 54 % — ABNORMAL HIGH (ref 39.0–52.0)
Hemoglobin: 15.6 g/dL (ref 13.0–17.0)
Hemoglobin: 15.6 g/dL (ref 13.0–17.0)
Hemoglobin: 18.4 g/dL — ABNORMAL HIGH (ref 13.0–17.0)
O2 Saturation: 100 %
O2 Saturation: 100 %
O2 Saturation: 96 %
Patient temperature: 36.6
Potassium: 3 mmol/L — ABNORMAL LOW (ref 3.5–5.1)
Potassium: 4.6 mmol/L (ref 3.5–5.1)
Potassium: 3.7 mmol/L (ref 3.5–5.1)
Sodium: 145 mmol/L (ref 135–145)
Sodium: 141 mmol/L (ref 135–145)
Sodium: 146 mmol/L — ABNORMAL HIGH (ref 135–145)
TCO2: 29 mmol/L (ref 22–32)
TCO2: 26 mmol/L (ref 22–32)
TCO2: 27 mmol/L (ref 22–32)
pCO2 arterial: 47.4 mmHg (ref 32.0–48.0)
pCO2 arterial: 34.1 mmHg (ref 32.0–48.0)
pCO2 arterial: 51.6 mmHg — ABNORMAL HIGH (ref 32.0–48.0)
pH, Arterial: 7.371 (ref 7.350–7.450)
pH, Arterial: 7.468 — ABNORMAL HIGH (ref 7.350–7.450)
pH, Arterial: 7.291 — ABNORMAL LOW (ref 7.350–7.450)
pO2, Arterial: 303 mmHg — ABNORMAL HIGH (ref 83.0–108.0)
pO2, Arterial: 316 mmHg — ABNORMAL HIGH (ref 83.0–108.0)
pO2, Arterial: 90 mmHg (ref 83.0–108.0)

## 2019-10-29 LAB — POCT I-STAT, CHEM 8
BUN: 20 mg/dL (ref 6–20)
BUN: 19 mg/dL (ref 6–20)
BUN: 18 mg/dL (ref 6–20)
BUN: 18 mg/dL (ref 6–20)
BUN: 19 mg/dL (ref 6–20)
Calcium, Ion: 1.24 mmol/L (ref 1.15–1.40)
Calcium, Ion: 1.23 mmol/L (ref 1.15–1.40)
Calcium, Ion: 1.13 mmol/L — ABNORMAL LOW (ref 1.15–1.40)
Calcium, Ion: 1.06 mmol/L — ABNORMAL LOW (ref 1.15–1.40)
Calcium, Ion: 1.22 mmol/L (ref 1.15–1.40)
Chloride: 105 mmol/L (ref 98–111)
Chloride: 105 mmol/L (ref 98–111)
Chloride: 105 mmol/L (ref 98–111)
Chloride: 106 mmol/L (ref 98–111)
Chloride: 106 mmol/L (ref 98–111)
Creatinine, Ser: 0.8 mg/dL (ref 0.61–1.24)
Creatinine, Ser: 0.8 mg/dL (ref 0.61–1.24)
Creatinine, Ser: 0.9 mg/dL (ref 0.61–1.24)
Creatinine, Ser: 0.7 mg/dL (ref 0.61–1.24)
Creatinine, Ser: 0.7 mg/dL (ref 0.61–1.24)
Glucose, Bld: 127 mg/dL — ABNORMAL HIGH (ref 70–99)
Glucose, Bld: 106 mg/dL — ABNORMAL HIGH (ref 70–99)
Glucose, Bld: 89 mg/dL (ref 70–99)
Glucose, Bld: 104 mg/dL — ABNORMAL HIGH (ref 70–99)
Glucose, Bld: 97 mg/dL (ref 70–99)
HCT: 54 % — ABNORMAL HIGH (ref 39.0–52.0)
HCT: 53 % — ABNORMAL HIGH (ref 39.0–52.0)
HCT: 47 % (ref 39.0–52.0)
HCT: 44 % (ref 39.0–52.0)
HCT: 45 % (ref 39.0–52.0)
Hemoglobin: 18.4 g/dL — ABNORMAL HIGH (ref 13.0–17.0)
Hemoglobin: 18 g/dL — ABNORMAL HIGH (ref 13.0–17.0)
Hemoglobin: 16 g/dL (ref 13.0–17.0)
Hemoglobin: 15 g/dL (ref 13.0–17.0)
Hemoglobin: 15.3 g/dL (ref 13.0–17.0)
Potassium: 3.6 mmol/L (ref 3.5–5.1)
Potassium: 3.1 mmol/L — ABNORMAL LOW (ref 3.5–5.1)
Potassium: 3.9 mmol/L (ref 3.5–5.1)
Potassium: 3.9 mmol/L (ref 3.5–5.1)
Potassium: 4.7 mmol/L (ref 3.5–5.1)
Sodium: 145 mmol/L (ref 135–145)
Sodium: 145 mmol/L (ref 135–145)
Sodium: 143 mmol/L (ref 135–145)
Sodium: 143 mmol/L (ref 135–145)
Sodium: 144 mmol/L (ref 135–145)
TCO2: 24 mmol/L (ref 22–32)
TCO2: 26 mmol/L (ref 22–32)
TCO2: 29 mmol/L (ref 22–32)
TCO2: 25 mmol/L (ref 22–32)
TCO2: 25 mmol/L (ref 22–32)

## 2019-10-29 LAB — GLUCOSE, CAPILLARY
Glucose-Capillary: 228 mg/dL — ABNORMAL HIGH (ref 70–99)
Glucose-Capillary: 133 mg/dL — ABNORMAL HIGH (ref 70–99)
Glucose-Capillary: 121 mg/dL — ABNORMAL HIGH (ref 70–99)
Glucose-Capillary: 135 mg/dL — ABNORMAL HIGH (ref 70–99)
Glucose-Capillary: 121 mg/dL — ABNORMAL HIGH (ref 70–99)
Glucose-Capillary: 113 mg/dL — ABNORMAL HIGH (ref 70–99)

## 2019-10-29 LAB — CBC
HCT: 58.6 % — ABNORMAL HIGH (ref 39.0–52.0)
HCT: 55.4 % — ABNORMAL HIGH (ref 39.0–52.0)
Hemoglobin: 18.7 g/dL — ABNORMAL HIGH (ref 13.0–17.0)
Hemoglobin: 17.7 g/dL — ABNORMAL HIGH (ref 13.0–17.0)
MCH: 28.4 pg (ref 26.0–34.0)
MCH: 29 pg (ref 26.0–34.0)
MCHC: 31.9 g/dL (ref 30.0–36.0)
MCHC: 31.9 g/dL (ref 30.0–36.0)
MCV: 88.9 fL (ref 80.0–100.0)
MCV: 90.8 fL (ref 80.0–100.0)
Platelets: 195 10*3/uL (ref 150–400)
Platelets: 180 10*3/uL (ref 150–400)
RBC: 6.59 MIL/uL — ABNORMAL HIGH (ref 4.22–5.81)
RBC: 6.1 MIL/uL — ABNORMAL HIGH (ref 4.22–5.81)
RDW: 15.9 % — ABNORMAL HIGH (ref 11.5–15.5)
RDW: 15.1 % (ref 11.5–15.5)
WBC: 8.3 10*3/uL (ref 4.0–10.5)
WBC: 14.7 10*3/uL — ABNORMAL HIGH (ref 4.0–10.5)
nRBC: 0 % (ref 0.0–0.2)
nRBC: 0 % (ref 0.0–0.2)

## 2019-10-29 LAB — COMPREHENSIVE METABOLIC PANEL
ALT: 29 U/L (ref 0–44)
AST: 29 U/L (ref 15–41)
Albumin: 3.1 g/dL — ABNORMAL LOW (ref 3.5–5.0)
Alkaline Phosphatase: 120 U/L (ref 38–126)
Anion gap: 11 (ref 5–15)
BUN: 21 mg/dL — ABNORMAL HIGH (ref 6–20)
CO2: 23 mmol/L (ref 22–32)
Calcium: 8.9 mg/dL (ref 8.9–10.3)
Chloride: 106 mmol/L (ref 98–111)
Creatinine, Ser: 1.08 mg/dL (ref 0.61–1.24)
GFR calc Af Amer: >60 mL/min (ref >60)
GFR calc non Af Amer: >60 mL/min (ref >60)
Glucose, Bld: 256 mg/dL — ABNORMAL HIGH (ref 70–99)
Potassium: 3.8 mmol/L (ref 3.5–5.1)
Sodium: 140 mmol/L (ref 135–145)
Total Bilirubin: 1.6 mg/dL — ABNORMAL HIGH (ref 0.3–1.2)
Total Protein: 6.4 g/dL — ABNORMAL LOW (ref 6.5–8.1)

## 2019-10-29 LAB — URINALYSIS, ROUTINE W REFLEX MICROSCOPIC
Bacteria, UA: NONE SEEN
Bilirubin Urine: NEGATIVE
Glucose, UA: >=500 mg/dL — AB
Ketones, ur: NEGATIVE mg/dL
Leukocytes,Ua: NEGATIVE
Nitrite: NEGATIVE
Protein, ur: 100 mg/dL — AB
Specific Gravity, Urine: 1.018 (ref 1.005–1.030)
pH: 7 (ref 5.0–8.0)

## 2019-10-29 LAB — TYPE AND SCREEN
ABO/RH(D): A POS
Antibody Screen: NEGATIVE

## 2019-10-29 LAB — APTT
aPTT: 35 seconds (ref 24–36)
aPTT: 40 seconds — ABNORMAL HIGH (ref 24–36)

## 2019-10-29 LAB — PROTIME-INR
INR: 1.2 (ref 0.8–1.2)
INR: 1.5 — ABNORMAL HIGH (ref 0.8–1.2)
Prothrombin Time: 15 seconds (ref 11.4–15.2)
Prothrombin Time: 17.7 seconds — ABNORMAL HIGH (ref 11.4–15.2)

## 2019-10-29 LAB — SURGICAL PCR SCREEN
MRSA, PCR: NEGATIVE
Staphylococcus aureus: POSITIVE — AB

## 2019-10-29 LAB — PLATELET COUNT: Platelets: 145 10*3/uL — ABNORMAL LOW (ref 150–400)

## 2019-10-29 LAB — HEMOGLOBIN AND HEMATOCRIT, BLOOD
HCT: 48.5 % (ref 39.0–52.0)
Hemoglobin: 15.7 g/dL (ref 13.0–17.0)

## 2019-10-29 SURGERY — CORONARY ARTERY BYPASS GRAFTING (CABG)
Anesthesia: General | Site: Chest

## 2019-10-29 MED ORDER — HEMOSTATIC AGENTS (NO CHARGE) OPTIME
TOPICAL | Status: DC | PRN
  Administered 2019-10-29 (×4): 1 via TOPICAL

## 2019-10-29 MED ORDER — STERILE WATER FOR INJECTION IJ SOLN
INTRAMUSCULAR | Status: DC | PRN
  Administered 2019-10-29: 10 mL

## 2019-10-29 MED ORDER — DEXTROSE 50 % IV SOLN: 0.0000 mL | INTRAVENOUS | Status: DC | PRN

## 2019-10-29 MED ORDER — CHLORHEXIDINE GLUCONATE CLOTH 2 % EX PADS: 6.0000 | MEDICATED_PAD | Freq: Once | CUTANEOUS | Status: DC

## 2019-10-29 MED ORDER — MAGNESIUM SULFATE 4 GM/100ML IV SOLN: 4.0000 g | Freq: Once | INTRAVENOUS | Status: AC

## 2019-10-29 MED ORDER — CHLORHEXIDINE GLUCONATE CLOTH 2 % EX PADS
6.0000 | MEDICATED_PAD | Freq: Every day | CUTANEOUS | Status: DC
  Administered 2019-10-30 – 2019-11-06 (×9): 6 via TOPICAL

## 2019-10-29 MED ORDER — MIDAZOLAM HCL (PF) 10 MG/2ML IJ SOLN
INTRAMUSCULAR | Status: AC
  Filled 2019-10-29: qty 2

## 2019-10-29 MED ORDER — VANCOMYCIN HCL 1000 MG IV SOLR
INTRAVENOUS | Status: DC | PRN
  Administered 2019-10-29: 3000 mg via TOPICAL

## 2019-10-29 MED ORDER — POTASSIUM CHLORIDE 10 MEQ/50ML IV SOLN
10.0000 meq | INTRAVENOUS | Status: AC
  Administered 2019-10-29 – 2019-10-30 (×3): 10 meq via INTRAVENOUS

## 2019-10-29 MED ORDER — ORAL CARE MOUTH RINSE
15.0000 mL | OROMUCOSAL | Status: DC
  Administered 2019-10-29 – 2019-10-30 (×2): 15 mL via OROMUCOSAL

## 2019-10-29 MED ORDER — NITROGLYCERIN 0.2 MG/ML ON CALL CATH LAB
INTRAVENOUS | Status: DC | PRN
  Administered 2019-10-29: 20 ug via INTRAVENOUS

## 2019-10-29 MED ORDER — SODIUM CHLORIDE 0.9 % IV SOLN: 250.0000 mL | INTRAVENOUS | Status: DC

## 2019-10-29 MED ORDER — TEMAZEPAM 7.5 MG PO CAPS: 15.0000 mg | ORAL_CAPSULE | Freq: Once | ORAL | Status: DC | PRN

## 2019-10-29 MED ORDER — MIDAZOLAM HCL 2 MG/2ML IJ SOLN: 2.0000 mg | INTRAMUSCULAR | Status: DC | PRN

## 2019-10-29 MED ORDER — BISACODYL 5 MG PO TBEC
5.0000 mg | DELAYED_RELEASE_TABLET | Freq: Once | ORAL | Status: AC
  Administered 2019-10-29: 5 mg via ORAL
  Filled 2019-10-29: qty 1

## 2019-10-29 MED ORDER — STERILE WATER FOR INJECTION IJ SOLN
INTRAMUSCULAR | Status: AC
  Filled 2019-10-29: qty 10

## 2019-10-29 MED ORDER — ASPIRIN 81 MG PO CHEW: 324.0000 mg | CHEWABLE_TABLET | Freq: Every day | ORAL | Status: DC

## 2019-10-29 MED ORDER — SODIUM CHLORIDE 0.9% FLUSH: 3.0000 mL | INTRAVENOUS | Status: DC | PRN

## 2019-10-29 MED ORDER — METOCLOPRAMIDE HCL 5 MG/ML IJ SOLN
10.0000 mg | Freq: Four times a day (QID) | INTRAMUSCULAR | Status: AC
  Administered 2019-10-29 – 2019-11-03 (×18): 10 mg via INTRAVENOUS
  Filled 2019-10-29 (×18): qty 2

## 2019-10-29 MED ORDER — SODIUM CHLORIDE (PF) 0.9 % IJ SOLN
INTRAMUSCULAR | Status: AC
  Filled 2019-10-29: qty 30

## 2019-10-29 MED ORDER — PROTAMINE SULFATE 10 MG/ML IV SOLN
INTRAVENOUS | Status: DC | PRN
  Administered 2019-10-29: 230 mg via INTRAVENOUS

## 2019-10-29 MED ORDER — BUPIVACAINE LIPOSOME 1.3 % IJ SUSP
INTRAMUSCULAR | Status: DC | PRN
  Administered 2019-10-29: 50 mL

## 2019-10-29 MED ORDER — LACTATED RINGERS IV SOLN: INTRAVENOUS | Status: DC | PRN

## 2019-10-29 MED ORDER — ACETAMINOPHEN 500 MG PO TABS: 1000.0000 mg | ORAL_TABLET | Freq: Four times a day (QID) | ORAL | Status: DC

## 2019-10-29 MED ORDER — DOCUSATE SODIUM 100 MG PO CAPS
200.0000 mg | ORAL_CAPSULE | Freq: Every day | ORAL | Status: DC
  Administered 2019-10-30 – 2019-11-06 (×5): 200 mg via ORAL
  Filled 2019-10-29 (×7): qty 2

## 2019-10-29 MED ORDER — MILRINONE LACTATE IN DEXTROSE 20-5 MG/100ML-% IV SOLN
0.1250 ug/kg/min | INTRAVENOUS | Status: DC
  Administered 2019-10-30: 0.375 ug/kg/min via INTRAVENOUS
  Administered 2019-10-31: 0.25 ug/kg/min via INTRAVENOUS
  Administered 2019-10-31: 0.375 ug/kg/min via INTRAVENOUS
  Administered 2019-11-01 (×2): 0.25 ug/kg/min via INTRAVENOUS
  Administered 2019-11-02 – 2019-11-05 (×3): 0.125 ug/kg/min via INTRAVENOUS
  Filled 2019-10-29 (×9): qty 100

## 2019-10-29 MED ORDER — METOPROLOL TARTRATE 25 MG/10 ML ORAL SUSPENSION: 12.5000 mg | Freq: Two times a day (BID) | ORAL | Status: DC

## 2019-10-29 MED ORDER — HYDROMORPHONE HCL 2 MG PO TABS
2.0000 mg | ORAL_TABLET | ORAL | Status: DC | PRN
  Administered 2019-10-30: 2 mg via ORAL
  Administered 2019-10-30: 4 mg via ORAL
  Administered 2019-10-30 (×2): 2 mg via ORAL
  Administered 2019-10-31 – 2019-11-01 (×3): 4 mg via ORAL
  Administered 2019-11-01: 2 mg via ORAL
  Administered 2019-11-01: 4 mg via ORAL
  Administered 2019-11-01: 2 mg via ORAL
  Administered 2019-11-02 (×5): 4 mg via ORAL
  Administered 2019-11-03: 2 mg via ORAL
  Administered 2019-11-03: 4 mg via ORAL
  Administered 2019-11-03: 2 mg via ORAL
  Administered 2019-11-04 – 2019-11-06 (×12): 4 mg via ORAL
  Filled 2019-10-29 (×2): qty 2
  Filled 2019-10-29: qty 1
  Filled 2019-10-29 (×3): qty 2
  Filled 2019-10-29: qty 1
  Filled 2019-10-29 (×4): qty 2
  Filled 2019-10-29: qty 1
  Filled 2019-10-29 (×4): qty 2
  Filled 2019-10-29: qty 1
  Filled 2019-10-29: qty 2
  Filled 2019-10-29: qty 1
  Filled 2019-10-29 (×2): qty 2
  Filled 2019-10-29: qty 1
  Filled 2019-10-29 (×2): qty 2
  Filled 2019-10-29: qty 1
  Filled 2019-10-29 (×5): qty 2

## 2019-10-29 MED ORDER — ACETAMINOPHEN 650 MG RE SUPP
650.0000 mg | Freq: Once | RECTAL | Status: AC
  Administered 2019-10-29: 650 mg via RECTAL

## 2019-10-29 MED ORDER — LACTATED RINGERS IV SOLN: INTRAVENOUS | Status: DC

## 2019-10-29 MED ORDER — MORPHINE SULFATE (PF) 2 MG/ML IV SOLN
1.0000 mg | INTRAVENOUS | Status: DC | PRN
  Administered 2019-10-29 – 2019-10-30 (×2): 2 mg via INTRAVENOUS
  Administered 2019-10-30: 4 mg via INTRAVENOUS
  Administered 2019-10-30 – 2019-10-31 (×8): 2 mg via INTRAVENOUS
  Administered 2019-11-01 – 2019-11-03 (×3): 4 mg via INTRAVENOUS
  Filled 2019-10-29: qty 1
  Filled 2019-10-29 (×3): qty 2
  Filled 2019-10-29 (×7): qty 1
  Filled 2019-10-29 (×2): qty 2
  Filled 2019-10-29: qty 1

## 2019-10-29 MED ORDER — PROPOFOL 10 MG/ML IV BOLUS
INTRAVENOUS | Status: AC
  Filled 2019-10-29: qty 20

## 2019-10-29 MED ORDER — CHLORHEXIDINE GLUCONATE 0.12% ORAL RINSE (MEDLINE KIT)
15.0000 mL | Freq: Two times a day (BID) | OROMUCOSAL | Status: DC
  Administered 2019-10-30 – 2019-10-31 (×4): 15 mL via OROMUCOSAL

## 2019-10-29 MED ORDER — SODIUM CHLORIDE 0.9% FLUSH
10.0000 mL | Freq: Two times a day (BID) | INTRAVENOUS | Status: DC
  Administered 2019-10-30 – 2019-11-02 (×6): 10 mL

## 2019-10-29 MED ORDER — SODIUM CHLORIDE 0.9 % IV SOLN: INTRAVENOUS | Status: DC

## 2019-10-29 MED ORDER — FAMOTIDINE IN NACL 20-0.9 MG/50ML-% IV SOLN
INTRAVENOUS | Status: AC
  Administered 2019-10-29: 20 mg via INTRAVENOUS
  Filled 2019-10-29: qty 50

## 2019-10-29 MED ORDER — ALBUMIN HUMAN 5 % IV SOLN
250.0000 mL | INTRAVENOUS | Status: AC | PRN
  Administered 2019-10-29: 12.5 g via INTRAVENOUS
  Filled 2019-10-29 (×2): qty 250

## 2019-10-29 MED ORDER — BUPIVACAINE HCL (PF) 0.5 % IJ SOLN
INTRAMUSCULAR | Status: AC
  Filled 2019-10-29: qty 30

## 2019-10-29 MED ORDER — CHLORHEXIDINE GLUCONATE 4 % EX LIQD
CUTANEOUS | Status: AC
  Filled 2019-10-29: qty 15

## 2019-10-29 MED ORDER — ACETAMINOPHEN 160 MG/5ML PO SOLN
1000.0000 mg | Freq: Four times a day (QID) | ORAL | Status: DC
  Administered 2019-10-30: 1000 mg
  Filled 2019-10-29: qty 40.6

## 2019-10-29 MED ORDER — FAMOTIDINE IN NACL 20-0.9 MG/50ML-% IV SOLN
20.0000 mg | Freq: Two times a day (BID) | INTRAVENOUS | Status: AC
  Administered 2019-10-30: 20 mg via INTRAVENOUS
  Filled 2019-10-29 (×2): qty 50

## 2019-10-29 MED ORDER — BISACODYL 5 MG PO TBEC
10.0000 mg | DELAYED_RELEASE_TABLET | Freq: Every day | ORAL | Status: DC
  Administered 2019-10-30 – 2019-11-03 (×4): 10 mg via ORAL
  Filled 2019-10-29 (×7): qty 2

## 2019-10-29 MED ORDER — METOPROLOL TARTRATE 12.5 MG HALF TABLET: 12.5000 mg | ORAL_TABLET | Freq: Two times a day (BID) | ORAL | Status: DC

## 2019-10-29 MED ORDER — MUPIROCIN 2 % EX OINT
1.0000 "application " | TOPICAL_OINTMENT | Freq: Two times a day (BID) | CUTANEOUS | Status: AC
  Administered 2019-10-29 – 2019-11-02 (×10): 1 via NASAL
  Filled 2019-10-29 (×3): qty 22

## 2019-10-29 MED ORDER — HEPARIN SODIUM (PORCINE) 1000 UNIT/ML IJ SOLN
INTRAMUSCULAR | Status: AC
  Filled 2019-10-29: qty 1

## 2019-10-29 MED ORDER — NITROGLYCERIN IN D5W 200-5 MCG/ML-% IV SOLN: 0.0000 ug/min | INTRAVENOUS | Status: DC

## 2019-10-29 MED ORDER — CHLORHEXIDINE GLUCONATE 0.12 % MT SOLN
15.0000 mL | OROMUCOSAL | Status: AC
  Administered 2019-10-29: 15 mL via OROMUCOSAL
  Filled 2019-10-29: qty 15

## 2019-10-29 MED ORDER — CHLORHEXIDINE GLUCONATE 0.12 % MT SOLN
15.0000 mL | Freq: Once | OROMUCOSAL | Status: AC
  Administered 2019-10-29: 15 mL via OROMUCOSAL
  Filled 2019-10-29: qty 15

## 2019-10-29 MED ORDER — SODIUM CHLORIDE 0.9% FLUSH
3.0000 mL | Freq: Two times a day (BID) | INTRAVENOUS | Status: DC
  Administered 2019-10-30 – 2019-11-01 (×3): 3 mL via INTRAVENOUS

## 2019-10-29 MED ORDER — PANTOPRAZOLE SODIUM 40 MG PO TBEC
40.0000 mg | DELAYED_RELEASE_TABLET | Freq: Every day | ORAL | Status: DC
  Administered 2019-10-31 – 2019-11-06 (×7): 40 mg via ORAL
  Filled 2019-10-29 (×7): qty 1

## 2019-10-29 MED ORDER — BISACODYL 10 MG RE SUPP: 10.0000 mg | Freq: Every day | RECTAL | Status: DC

## 2019-10-29 MED ORDER — PROTAMINE SULFATE 10 MG/ML IV SOLN
INTRAVENOUS | Status: AC
  Filled 2019-10-29: qty 10

## 2019-10-29 MED ORDER — PROPOFOL 10 MG/ML IV BOLUS
INTRAVENOUS | Status: DC | PRN
  Administered 2019-10-29: 30 mg via INTRAVENOUS

## 2019-10-29 MED ORDER — METOPROLOL TARTRATE 5 MG/5ML IV SOLN
2.5000 mg | INTRAVENOUS | Status: DC | PRN
  Administered 2019-11-01: 2.5 mg via INTRAVENOUS
  Administered 2019-11-02: 5 mg via INTRAVENOUS
  Administered 2019-11-03: 2.5 mg via INTRAVENOUS
  Filled 2019-10-29 (×3): qty 5

## 2019-10-29 MED ORDER — ASPIRIN EC 325 MG PO TBEC: 325.0000 mg | DELAYED_RELEASE_TABLET | Freq: Every day | ORAL | Status: DC

## 2019-10-29 MED ORDER — DEXMEDETOMIDINE HCL IN NACL 400 MCG/100ML IV SOLN
0.0000 ug/kg/h | INTRAVENOUS | Status: DC
  Filled 2019-10-29 (×2): qty 100

## 2019-10-29 MED ORDER — ROCURONIUM BROMIDE 10 MG/ML (PF) SYRINGE
PREFILLED_SYRINGE | INTRAVENOUS | Status: DC | PRN
  Administered 2019-10-29: 40 mg via INTRAVENOUS
  Administered 2019-10-29: 60 mg via INTRAVENOUS
  Administered 2019-10-29 (×2): 50 mg via INTRAVENOUS

## 2019-10-29 MED ORDER — MAGNESIUM SULFATE 4 GM/100ML IV SOLN
INTRAVENOUS | Status: AC
  Administered 2019-10-29: 4 g via INTRAVENOUS
  Filled 2019-10-29: qty 100

## 2019-10-29 MED ORDER — EPINEPHRINE HCL 5 MG/250ML IV SOLN IN NS
0.0000 ug/min | INTRAVENOUS | Status: DC
  Administered 2019-10-30: 9 ug/min via INTRAVENOUS
  Filled 2019-10-29 (×3): qty 250

## 2019-10-29 MED ORDER — SODIUM CHLORIDE 0.9% FLUSH
10.0000 mL | INTRAVENOUS | Status: DC | PRN
  Administered 2019-10-29: 10 mL

## 2019-10-29 MED ORDER — PROTAMINE SULFATE 10 MG/ML IV SOLN
INTRAVENOUS | Status: AC
  Filled 2019-10-29: qty 25

## 2019-10-29 MED ORDER — PHENYLEPHRINE HCL-NACL 20-0.9 MG/250ML-% IV SOLN: 0.0000 ug/min | INTRAVENOUS | Status: DC

## 2019-10-29 MED ORDER — FENTANYL CITRATE (PF) 250 MCG/5ML IJ SOLN
INTRAMUSCULAR | Status: DC | PRN
  Administered 2019-10-29: 250 ug via INTRAVENOUS
  Administered 2019-10-29: 150 ug via INTRAVENOUS
  Administered 2019-10-29: 100 ug via INTRAVENOUS
  Administered 2019-10-29: 250 ug via INTRAVENOUS
  Administered 2019-10-29: 400 ug via INTRAVENOUS
  Administered 2019-10-29 (×2): 50 ug via INTRAVENOUS

## 2019-10-29 MED ORDER — MIDAZOLAM HCL 5 MG/5ML IJ SOLN
INTRAMUSCULAR | Status: DC | PRN
  Administered 2019-10-29 (×3): 2 mg via INTRAVENOUS
  Administered 2019-10-29 (×2): 1 mg via INTRAVENOUS
  Administered 2019-10-29: 2 mg via INTRAVENOUS

## 2019-10-29 MED ORDER — SUCCINYLCHOLINE CHLORIDE 20 MG/ML IJ SOLN
INTRAMUSCULAR | Status: DC | PRN
Start: 2019-10-29 — End: 2019-10-29
  Administered 2019-10-29: 100 mg via INTRAVENOUS

## 2019-10-29 MED ORDER — VANCOMYCIN HCL IN DEXTROSE 1-5 GM/200ML-% IV SOLN
1000.0000 mg | Freq: Once | INTRAVENOUS | Status: AC
  Administered 2019-10-30: 1000 mg via INTRAVENOUS
  Filled 2019-10-29: qty 200

## 2019-10-29 MED ORDER — FENTANYL CITRATE (PF) 250 MCG/5ML IJ SOLN
INTRAMUSCULAR | Status: AC
  Filled 2019-10-29: qty 25

## 2019-10-29 MED ORDER — PLASMA-LYTE 148 IV SOLN
INTRAVENOUS | Status: DC | PRN
  Administered 2019-10-29: 500 mL

## 2019-10-29 MED ORDER — CHLORHEXIDINE GLUCONATE CLOTH 2 % EX PADS: 6.0000 | MEDICATED_PAD | Freq: Every day | CUTANEOUS | Status: DC

## 2019-10-29 MED ORDER — METOPROLOL TARTRATE 12.5 MG HALF TABLET
12.5000 mg | ORAL_TABLET | Freq: Once | ORAL | Status: AC
  Administered 2019-10-29: 12.5 mg via ORAL
  Filled 2019-10-29: qty 1

## 2019-10-29 MED ORDER — PHENYLEPHRINE HCL-NACL 20-0.9 MG/250ML-% IV SOLN
INTRAVENOUS | Status: DC | PRN
  Administered 2019-10-29: 40 ug/min via INTRAVENOUS

## 2019-10-29 MED ORDER — 0.9 % SODIUM CHLORIDE (POUR BTL) OPTIME
TOPICAL | Status: DC | PRN
  Administered 2019-10-29: 5000 mL

## 2019-10-29 MED ORDER — INSULIN REGULAR(HUMAN) IN NACL 100-0.9 UT/100ML-% IV SOLN: INTRAVENOUS | Status: DC

## 2019-10-29 MED ORDER — ALBUMIN HUMAN 5 % IV SOLN
INTRAVENOUS | Status: DC | PRN
Start: 2019-10-29 — End: 2019-10-29

## 2019-10-29 MED ORDER — ACETAMINOPHEN 160 MG/5ML PO SOLN: 650.0000 mg | Freq: Once | ORAL | Status: AC

## 2019-10-29 MED ORDER — SODIUM CHLORIDE 0.9 % IV SOLN
1.5000 g | Freq: Two times a day (BID) | INTRAVENOUS | Status: AC
  Administered 2019-10-29 – 2019-10-31 (×4): 1.5 g via INTRAVENOUS
  Filled 2019-10-29 (×4): qty 1.5

## 2019-10-29 MED ORDER — ONDANSETRON HCL 4 MG/2ML IJ SOLN
4.0000 mg | Freq: Four times a day (QID) | INTRAMUSCULAR | Status: DC | PRN
  Administered 2019-11-05: 4 mg via INTRAVENOUS
  Filled 2019-10-29: qty 2

## 2019-10-29 MED ORDER — HEPARIN SODIUM (PORCINE) 1000 UNIT/ML IJ SOLN
INTRAMUSCULAR | Status: DC | PRN
  Administered 2019-10-29: 25000 [IU] via INTRAVENOUS

## 2019-10-29 MED ORDER — SODIUM CHLORIDE 0.45 % IV SOLN: INTRAVENOUS | Status: DC | PRN

## 2019-10-29 MED ORDER — LACTATED RINGERS IV SOLN: 500.0000 mL | Freq: Once | INTRAVENOUS | Status: DC | PRN

## 2019-10-29 MED ORDER — VANCOMYCIN HCL 1000 MG IV SOLR
INTRAVENOUS | Status: AC
  Filled 2019-10-29: qty 3000

## 2019-10-29 MED ORDER — NOREPINEPHRINE 4 MG/250ML-% IV SOLN: 0.0000 ug/min | INTRAVENOUS | Status: DC

## 2019-10-29 MED FILL — Heparin Sodium (Porcine) Inj 1000 Unit/ML: INTRAMUSCULAR | Qty: 10 | Status: AC

## 2019-10-29 SURGICAL SUPPLY — 123 items
ADAPTER CARDIO PERF ANTE/RETRO (ADAPTER) ×4 IMPLANT
ADH SKN CLS APL DERMABOND .7 (GAUZE/BANDAGES/DRESSINGS) ×3
ADPR PRFSN 84XANTGRD RTRGD (ADAPTER) ×3
APL SKNCLS STERI-STRIP NONHPOA (GAUZE/BANDAGES/DRESSINGS) ×6
APPLIER CLIP 9.375 SM OPEN (CLIP)
APR CLP SM 9.3 20 MLT OPN (CLIP)
BAG DECANTER FOR FLEXI CONT (MISCELLANEOUS) ×4 IMPLANT
BENZOIN TINCTURE PRP APPL 2/3 (GAUZE/BANDAGES/DRESSINGS) ×2 IMPLANT
BLADE CLIPPER SURG (BLADE) ×3 IMPLANT
BLADE STERNUM SYSTEM 6 (BLADE) ×4 IMPLANT
BLADE SURG 15 STRL LF DISP TIS (BLADE) ×3 IMPLANT
BLADE SURG 15 STRL SS (BLADE)
BNDG ELASTIC 4X5.8 VLCR STR LF (GAUZE/BANDAGES/DRESSINGS) ×4 IMPLANT
BNDG ELASTIC 6X5.8 VLCR STR LF (GAUZE/BANDAGES/DRESSINGS) ×3 IMPLANT
BNDG GAUZE ELAST 4 BULKY (GAUZE/BANDAGES/DRESSINGS) ×4 IMPLANT
CANISTER SUCT 3000ML PPV (MISCELLANEOUS) ×4 IMPLANT
CANISTER WOUNDNEG PRESSURE 500 (CANNISTER) ×1 IMPLANT
CANNULA NON VENT 20FR 12 (CANNULA) ×1 IMPLANT
CATH CPB KIT HENDRICKSON (MISCELLANEOUS) ×4 IMPLANT
CATH ROBINSON RED A/P 18FR (CATHETERS) ×8 IMPLANT
CLIP APPLIE 9.375 SM OPEN (CLIP) ×3 IMPLANT
CLIP RETRACTION 3.0MM CORONARY (MISCELLANEOUS) ×3 IMPLANT
CLIP VESOCCLUDE MED 24/CT (CLIP) IMPLANT
CLIP VESOCCLUDE SM WIDE 24/CT (CLIP) IMPLANT
CONN ST 1/4X3/8  BEN (MISCELLANEOUS) ×12
CONN ST 1/4X3/8 BEN (MISCELLANEOUS) IMPLANT
COVER MAYO STAND STRL (DRAPES) ×3 IMPLANT
CUFF TOURN SGL QUICK 18X4 (TOURNIQUET CUFF) IMPLANT
CUFF TOURN SGL QUICK 24 (TOURNIQUET CUFF)
CUFF TRNQT CYL 24X4X16.5-23 (TOURNIQUET CUFF) IMPLANT
DERMABOND ADVANCED (GAUZE/BANDAGES/DRESSINGS) ×1
DERMABOND ADVANCED .7 DNX12 (GAUZE/BANDAGES/DRESSINGS) ×3 IMPLANT
DRAIN CHANNEL 28F RND 3/8 FF (WOUND CARE) ×13 IMPLANT
DRAPE CARDIOVASCULAR INCISE (DRAPES) ×4
DRAPE EXTREMITY T 121X128X90 (DISPOSABLE) ×4 IMPLANT
DRAPE HALF SHEET 40X57 (DRAPES) ×4 IMPLANT
DRAPE SLUSH/WARMER DISC (DRAPES) ×4 IMPLANT
DRAPE SRG 135X102X78XABS (DRAPES) ×3 IMPLANT
DRSG AQUACEL AG ADV 3.5X14 (GAUZE/BANDAGES/DRESSINGS) ×3 IMPLANT
ELECT CAUTERY BLADE 6.4 (BLADE) ×4 IMPLANT
ELECT REM PT RETURN 9FT ADLT (ELECTROSURGICAL) ×8
ELECTRODE REM PT RTRN 9FT ADLT (ELECTROSURGICAL) ×6 IMPLANT
FELT TEFLON 1X6 (MISCELLANEOUS) ×7 IMPLANT
GAUZE SPONGE 4X4 12PLY STRL (GAUZE/BANDAGES/DRESSINGS) ×8 IMPLANT
GAUZE SPONGE 4X4 12PLY STRL LF (GAUZE/BANDAGES/DRESSINGS) ×2 IMPLANT
GEL ULTRASOUND 20GR AQUASONIC (MISCELLANEOUS) ×3 IMPLANT
GLOVE BIO SURGEON STRL SZ 6.5 (GLOVE) ×5 IMPLANT
GLOVE BIOGEL PI IND STRL 6.5 (GLOVE) IMPLANT
GLOVE BIOGEL PI IND STRL 7.5 (GLOVE) IMPLANT
GLOVE BIOGEL PI IND STRL 8 (GLOVE) IMPLANT
GLOVE BIOGEL PI INDICATOR 6.5 (GLOVE) ×1
GLOVE BIOGEL PI INDICATOR 7.5 (GLOVE) ×2
GLOVE BIOGEL PI INDICATOR 8 (GLOVE) ×1
GLOVE NEODERM STRL 7.5  LF PF (GLOVE) ×9
GLOVE NEODERM STRL 7.5 LF PF (GLOVE) ×9 IMPLANT
GLOVE SURG NEODERM 7.5  LF PF (GLOVE) ×3
GOWN STRL REUS W/ TWL LRG LVL3 (GOWN DISPOSABLE) ×12 IMPLANT
GOWN STRL REUS W/ TWL XL LVL3 (GOWN DISPOSABLE) IMPLANT
GOWN STRL REUS W/TWL LRG LVL3 (GOWN DISPOSABLE) ×32
GOWN STRL REUS W/TWL XL LVL3 (GOWN DISPOSABLE) ×4
HEMOSTAT POWDER SURGIFOAM 1G (HEMOSTASIS) ×8 IMPLANT
INSERT FOGARTY XLG (MISCELLANEOUS) ×1 IMPLANT
KIT BASIN OR (CUSTOM PROCEDURE TRAY) ×4 IMPLANT
KIT PREVENA INCISION MGT20CM45 (CANNISTER) ×1 IMPLANT
KIT SUCTION CATH 14FR (SUCTIONS) ×4 IMPLANT
KIT TURNOVER KIT B (KITS) ×4 IMPLANT
KIT VASOVIEW HEMOPRO 2 VH 4000 (KITS) ×3 IMPLANT
MARKER GRAFT CORONARY BYPASS (MISCELLANEOUS) ×10 IMPLANT
NDL 18GX1X1/2 (RX/OR ONLY) (NEEDLE) ×3 IMPLANT
NEEDLE 18GX1X1/2 (RX/OR ONLY) (NEEDLE) ×4 IMPLANT
NS IRRIG 1000ML POUR BTL (IV SOLUTION) ×20 IMPLANT
PACK E OPEN HEART (SUTURE) ×4 IMPLANT
PACK OPEN HEART (CUSTOM PROCEDURE TRAY) ×4 IMPLANT
PACK SPY-PHI (KITS) ×1 IMPLANT
PAD ARMBOARD 7.5X6 YLW CONV (MISCELLANEOUS) ×8 IMPLANT
PAD ELECT DEFIB RADIOL ZOLL (MISCELLANEOUS) ×4 IMPLANT
PENCIL BUTTON HOLSTER BLD 10FT (ELECTRODE) ×4 IMPLANT
POSITIONER HEAD DONUT 9IN (MISCELLANEOUS) ×4 IMPLANT
POWDER SURGICEL 3.0 GRAM (HEMOSTASIS) ×1 IMPLANT
PREVENA RESTOR AXIOFORM 29X28 (GAUZE/BANDAGES/DRESSINGS) ×1 IMPLANT
PUNCH AORTIC ROTATE 4.0MM (MISCELLANEOUS) ×1 IMPLANT
SEALANT SURG COSEAL 8ML (VASCULAR PRODUCTS) ×1 IMPLANT
SET CARDIOPLEGIA MPS 5001102 (MISCELLANEOUS) ×1 IMPLANT
SHEARS HARMONIC 9CM CVD (BLADE) ×1 IMPLANT
SPONGE LAP 18X18 RF (DISPOSABLE) ×1 IMPLANT
STAPLER VISISTAT (STAPLE) ×1 IMPLANT
SUPPORT HEART JANKE-BARRON (MISCELLANEOUS) ×4 IMPLANT
SUT BONE WAX W31G (SUTURE) ×4 IMPLANT
SUT MNCRL AB 3-0 PS2 18 (SUTURE) ×8 IMPLANT
SUT PDS AB 1 CTX 36 (SUTURE) ×8 IMPLANT
SUT PROLENE 3 0 SH DA (SUTURE) ×5 IMPLANT
SUT PROLENE 5 0 C 1 36 (SUTURE) IMPLANT
SUT PROLENE 6 0 C 1 30 (SUTURE) ×12 IMPLANT
SUT PROLENE 7 0 BV 1 (SUTURE) ×1 IMPLANT
SUT PROLENE 8 0 BV175 6 (SUTURE) IMPLANT
SUT PROLENE BLUE 7 0 (SUTURE) ×4 IMPLANT
SUT SILK  1 MH (SUTURE) ×8
SUT SILK 1 MH (SUTURE) IMPLANT
SUT SILK 2 0 SH CR/8 (SUTURE) IMPLANT
SUT SILK 3 0 SH CR/8 (SUTURE) IMPLANT
SUT STEEL 6MS V (SUTURE) ×4 IMPLANT
SUT STEEL SZ 6 DBL 3X14 BALL (SUTURE) ×4 IMPLANT
SUT VIC AB 2-0 CT1 27 (SUTURE) ×4
SUT VIC AB 2-0 CT1 TAPERPNT 27 (SUTURE) IMPLANT
SUT VIC AB 2-0 CTX 27 (SUTURE) IMPLANT
SUT VIC AB 3-0 SH 27 (SUTURE)
SUT VIC AB 3-0 SH 27X BRD (SUTURE) IMPLANT
SUT VIC AB 3-0 X1 27 (SUTURE) IMPLANT
SYR 10ML LL (SYRINGE) IMPLANT
SYR 30ML LL (SYRINGE) ×4 IMPLANT
SYR 3ML LL SCALE MARK (SYRINGE) ×3 IMPLANT
SYR 50ML SLIP (SYRINGE) IMPLANT
SYR BULB IRRIG 60ML STRL (SYRINGE) IMPLANT
SYSTEM SAHARA CHEST DRAIN ATS (WOUND CARE) ×5 IMPLANT
TAPE CLOTH SURG 4X10 WHT LF (GAUZE/BANDAGES/DRESSINGS) ×1 IMPLANT
TAPE PAPER 2X10 WHT MICROPORE (GAUZE/BANDAGES/DRESSINGS) ×1 IMPLANT
TOWEL GREEN STERILE (TOWEL DISPOSABLE) ×4 IMPLANT
TOWEL GREEN STERILE FF (TOWEL DISPOSABLE) ×4 IMPLANT
TRAY FOLEY SLVR 16FR TEMP STAT (SET/KITS/TRAYS/PACK) ×4 IMPLANT
TUBING LAP HI FLOW INSUFFLATIO (TUBING) ×3 IMPLANT
UNDERPAD 30X36 HEAVY ABSORB (UNDERPADS AND DIAPERS) ×6 IMPLANT
WATER STERILE IRR 1000ML POUR (IV SOLUTION) ×8 IMPLANT
WATER STERILE IRR 1000ML UROMA (IV SOLUTION) IMPLANT

## 2019-10-29 NOTE — Progress Notes (Signed)
Brief Nutrition Education Note  RD consulted for nutrition education regarding CHF and diabetes.  RD attempted ot speak with pt x 2 yesterday, however, unable to reach (see progress note dated on 10/28/19 for further details).   RD attempted to reach pt today via hospital room phone, however, no answer.   RD has attached handouts to discharge instructions and referred to [facility] for additional outpatient support. RD will re-attempt to engage pt and family as time allows.   Current diet order is carb modified, patient is consuming approximately 75-100% of meals at this time. Labs and medications reviewed. No further nutrition interventions warranted at this time. RD contact information provided. If additional nutrition issues arise, please re-consult RD.   Loistine Chance, RD, LDN, [place] Registered Dietitian II Certified Diabetes Care and Education Specialist Please refer to [facility] for RD and/or RD on-call/weekend/after hours pager

## 2019-10-29 NOTE — Brief Op Note (Signed)
10/24/2019 - 10/29/2019  6:46 PM  PATIENT:  [name]  59 y.o. male  PRE-OPERATIVE DIAGNOSIS:  congestive heart failure  POST-OPERATIVE DIAGNOSIS:  congestive heart failure  PROCEDURE:  Procedure(s):  CORONARY ARTERY BYPASS GRAFTING x 3 -LIMA to LAD -RIMA to PDA -RADIAL ARTERY TO LEFT CIRCUMFLEX  RADIAL ARTERY HARVEST  -Open radial harvest  TRANSESOPHAGEAL ECHOCARDIOGRAM (TEE) (N/A)  SURGEON:  Surgeon(s) and Role:    * Wonda Olds, MD - Primary  PHYSICIAN ASSISTANT: Ellwood Handler PA-C  ANESTHESIA:   general    BLOOD ADMINISTERED: CELLSAVER  DRAINS: Left and Right Pleural Chest Tubes, Mediastinal Chest Drains   LOCAL MEDICATIONS USED:  BUPIVICAINE   SPECIMEN:  No Specimen  DISPOSITION OF SPECIMEN:  N/A  COUNTS:  YES  TOURNIQUET:  * No tourniquets in log *  DICTATION: .Dragon Dictation  PLAN OF CARE: Admit to inpatient   PATIENT DISPOSITION:  ICU - intubated and hemodynamically stable.   Delay start of Pharmacological VTE agent (>24hrs) due to surgical blood loss or risk of bleeding: yes

## 2019-10-29 NOTE — Progress Notes (Signed)
Physical Therapy Treatment Patient Details Name: [name] MRN: [id number] DOB: [birth year] Today's Date: 10/29/2019    History of Present Illness Patient is a 59 y/o male who presents 10/24/19 with BLE swelling and SOB. Elevated BNP. CXR- pulmonary edema, right pleural effusion. 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 S/p thoracentesis. Plan for CABG 7/8. PMH includes CAD, tobacco abuse, HTN, HLD, DM, PAD, panic attacks.   PT Comments    Pt progressing well with mobility. Preparing for CABG sx today, therefore today's session focused on education and practicing functional mobility while maintaining sternal precautions. Pt performing well, independent; expect good progress post-op. Wife present and supportive. Will follow-up for PT treatment post-op.  SpO2 92-94% on RA with ambulation, HR 60s    Follow Up Recommendations  No PT follow up;Supervision - Intermittent     Equipment Recommendations  None recommended by PT    Recommendations for Other Services       Precautions / Restrictions Precautions Precautions: None Precaution Comments: Educ pt and wife on sternal precautions "move in the tube" in preparation for CABG today Restrictions Weight Bearing Restrictions: No    Mobility  Bed Mobility               General bed mobility comments: Received sitting in recliner  Transfers Overall transfer level: Independent Equipment used: None             General transfer comment: Educ on standing with sternal precautions (hands on knees or hands right next to legs); able to perform 5x sit<>stand from chair without UE support  Ambulation/Gait Ambulation/Gait assistance: Independent Gait Distance (Feet): 400 Feet Assistive device: None Gait Pattern/deviations: WFL(Within Functional Limits)   Gait velocity interpretation: 1.31 - 2.62 ft/sec, indicative of limited community ambulator General Gait Details: Independent ambulation;  SpO2 92-94% on RA, HR 60s, no DOE noted and pt denies SOB   Stairs             Wheelchair Mobility    Modified Rankin (Stroke Patients Only)       Balance Overall balance assessment: No apparent balance deficits (not formally assessed)                                          Cognition Arousal/Alertness: Awake/alert Behavior During Therapy: WFL for tasks assessed/performed Overall Cognitive Status: Within Functional Limits for tasks assessed                                        Exercises Other Exercises Other Exercises: Able to complete 5x sit<>stand without UE support in 17.7 sec    General Comments General comments (skin integrity, edema, etc.): Wife present and supportive      Pertinent Vitals/Pain Pain Assessment: No/denies pain    Home Living                      Prior Function            PT Goals (current goals can now be found in the care plan section) Progress towards PT goals: Progressing toward goals    Frequency    Min 3X/week      PT Plan Current plan remains appropriate    Co-evaluation  AM-PAC PT "6 Clicks" Mobility   Outcome Measure  Help needed turning from your back to your side while in a flat bed without using bedrails?: None Help needed moving from lying on your back to sitting on the side of a flat bed without using bedrails?: None Help needed moving to and from a bed to a chair (including a wheelchair)?: None Help needed standing up from a chair using your arms (e.g., wheelchair or bedside chair)?: None Help needed to walk in hospital room?: None Help needed climbing 3-5 steps with a railing? : None 6 Click Score: 24    End of Session   Activity Tolerance: Patient tolerated treatment well Patient left: in chair;with call bell/phone within reach;with family/visitor present Nurse Communication: Mobility status PT Visit Diagnosis: Unsteadiness on feet  (R26.81);Difficulty in walking, not elsewhere classified (R26.2)     Time: 5500-1642 PT Time Calculation (min) (ACUTE ONLY): 11 min  Charges:  $Self Care/Home Management: [place], PT, DPT Acute Rehabilitation Services  Pager [phone] Office [phone]  Derry Lory 10/29/2019, 8:27 AM

## 2019-10-29 NOTE — Anesthesia Procedure Notes (Signed)
Central Venous Catheter Insertion Performed by: Suzette Battiest, MD, anesthesiologist Start/End7/11/2019 1:30 PM, 10/29/2019 1:45 PM Patient location: Pre-op. Preanesthetic checklist: patient identified, IV checked, site marked, risks and benefits discussed, surgical consent, monitors and equipment checked, pre-op evaluation, timeout performed and anesthesia consent Hand hygiene performed  and maximum sterile barriers used  PA cath was placed.Swan type:thermodilution Procedure performed without using ultrasound guided technique. Attempts: 1 Patient tolerated the procedure well with no immediate complications.

## 2019-10-29 NOTE — Consult Note (Signed)
[address]       [place], [address]             [phone]        Raheem L Gretzinger [facility] Medical Record #[id number] Date of Birth: 1960/11/30  Referring: No ref. provider found Primary Care: Concepcion Elk, MD Primary Cardiologist:Brian Stanford Breed, MD  Chief Complaint:    Chief Complaint  Patient presents with  . Shortness of Breath    History of Present Illness:      59 year old gentleman with a past medical history of hypertension type 2 diabetes sleep apnea tobacco abuse and known coronary artery disease presented with worsening shortness of breath and fatigue in addition to peripheral edema. The patient has had prior PCI and known reduced LV function. He was well compensated until recently when he developed the symptoms noted above. With the symptoms he presented to the emergency department where he was found to be in heart failure. The patient was admitted has been diuresed. He is feeling symptomatically better and underwent left heart catheterization today demonstrating multiple vessel coronary artery disease. He is referred for CABG.  Current Activity/ Functional Status: Patient will be independent with mobility/ambulation, transfers, ADL's, IADL's.   Zubrod Score: At the time of surgery this patient's most appropriate activity status/level should be described as: []     0    Normal activity, no symptoms []     1    Restricted in physical strenuous activity but ambulatory, able to do out light work []     2    Ambulatory and capable of self care, unable to do work activities, up and about                 more than 50%  Of the time                            []     3    Only limited self care, in bed greater than 50% of waking hours []     4    Completely disabled, no self care, confined to bed or chair []     5    Moribund  Past Medical History:  Diagnosis Date  . Anxiety   . CAD (coronary artery disease)   . Chronic back pain   . Colon polyps    . Diabetes mellitus   . Heart attack ([place])   . Hyperlipidemia   . Hypertension   . Panic attacks   . PVD (peripheral vascular disease) ([place])     Past Surgical History:  Procedure Laterality Date  . CORONARY ANGIOPLASTY WITH STENT PLACEMENT    . EAR CYST EXCISION  12/10/2011   Procedure: CYST REMOVAL;  Surgeon: Gae Bon, DDS;  Location: [facility];  Service: Oral Surgery;  Laterality: Left;  Left Mandible Cyst Removal  . IR THORACENTESIS ASP PLEURAL SPACE W/IMG GUIDE  10/27/2019  . MULTIPLE EXTRACTIONS WITH ALVEOLOPLASTY  12/10/2011   Procedure: MULTIPLE EXTRACION WITH ALVEOLOPLASTY;  Surgeon: Gae Bon, DDS;  Location: [facility];  Service: Oral Surgery;  Laterality: N/A;  Right Maxillary Tuberosity Reduction; Right  Maxillary Buccal Exostosis; Bilateral Mandibular Tori   . PERIPHERAL VASCULAR CATHETERIZATION N/A 12/28/2014   Procedure: Abdominal Aortogram;  Surgeon: Serafina Mitchell, MD;  Location: [facility];  Service: Cardiovascular;  Laterality: N/A;  . PERIPHERAL VASCULAR CATHETERIZATION N/A 12/20/2015   Procedure: Abdominal Aortogram;  Surgeon: Durene Fruits  Pierre Bali, MD;  Location: [facility];  Service: Cardiovascular;  Laterality: N/A;  . PERIPHERAL VASCULAR CATHETERIZATION  12/20/2015   Procedure: Peripheral Vascular Balloon Angioplasty;  Surgeon: Serafina Mitchell, MD;  Location: [facility];  Service: Cardiovascular;;    Social History   Tobacco Use  Smoking Status Current Every Day Smoker  . Packs/day: 0.25  . Years: 25.00  . Pack years: 6.25  . Types: Cigarettes  Smokeless Tobacco Never Used  Tobacco Comment   1/4 pack per day    Social History   Substance and Sexual Activity  Alcohol Use No  . Alcohol/week: 0.0 standard drinks     Allergies  Allergen Reactions  . Codeine Shortness Of Breath and Itching  . Gabapentin Other (See Comments)    Suicidal thoughts, extreme dreams  . Pregabalin     Suicidal thoughts,extreme dreams   . Simvastatin      Pain in joints  . Hydrocodone Nausea Only and Rash    Breathing problems   . Lisinopril Itching and Rash  . Venlafaxine Rash    Current Facility-Administered Medications  Medication Dose Route Frequency Provider Last Rate Last Admin  . 0.9 %  sodium chloride infusion  250 mL Intravenous PRN Lauree Chandler D, MD      . 0.9 %  sodium chloride infusion   Intravenous Continuous Burnell Blanks, MD 50 mL/hr at 10/28/19 1805 New Bag at 10/28/19 1805  . 0.9 %  sodium chloride infusion  250 mL Intravenous PRN Burnell Blanks, MD      . acetaminophen (TYLENOL) tablet 650 mg  650 mg Oral Q4H PRN Burnell Blanks, MD   650 mg at 10/26/19 0018  . aspirin EC tablet 81 mg  81 mg Oral Daily Burnell Blanks, MD   81 mg at 10/27/19 0815  . atorvastatin (LIPITOR) tablet 40 mg  40 mg Oral Daily Burnell Blanks, MD   40 mg at 10/28/19 0086  . carvedilol (COREG) tablet 3.125 mg  3.125 mg Oral BID WC Burnell Blanks, MD   3.125 mg at 10/28/19 1741  . cefUROXime (ZINACEF) 1.5 g in sodium chloride 0.9 % 100 mL IVPB  1.5 g Intravenous To OR Marie Borowski Z, MD      . cefUROXime (ZINACEF) 750 mg in sodium chloride 0.9 % 100 mL IVPB  750 mg Intravenous To OR Laurel Harnden Z, MD      . dexmedetomidine (PRECEDEX) 400 MCG/100ML (4 mcg/mL) infusion  0.1-0.7 mcg/kg/hr Intravenous To OR Jonan Seufert, Glenice Bow, MD      . empagliflozin (JARDIANCE) tablet 25 mg  25 mg Oral Daily Burnell Blanks, MD   25 mg at 10/28/19 0955  . EPINEPHrine (ADRENALIN) 4 mg in NS 250 mL (0.016 mg/mL) premix infusion  0-10 mcg/min Intravenous To OR Marykay Mccleod Z, MD      . furosemide (LASIX) injection 80 mg  80 mg Intravenous BID Burnell Blanks, MD   80 mg at 10/28/19 1804  . heparin 30,000 units/NS 1000 mL solution for CELLSAVER   Other To OR Afreen Siebels, Glenice Bow, MD      . heparin sodium (porcine) 2,500 Units, papaverine 30 mg in electrolyte-148 (PLASMALYTE-148) 500 mL  irrigation   Irrigation To OR Koleen Celia Z, MD      . insulin aspart (novoLOG) injection 0-20 Units  0-20 Units Subcutaneous TID WC Burnell Blanks, MD   3 Units at 10/28/19 [phone]  . insulin glargine (LANTUS)  injection 10 Units  10 Units Subcutaneous QHS Burnell Blanks, MD   10 Units at 10/28/19 2347  . insulin regular, human (MYXREDLIN) 100 units/ 100 mL infusion   Intravenous To OR Kirandeep Fariss Z, MD      . ipratropium-albuterol (DUONEB) 0.5-2.5 (3) MG/3ML nebulizer solution 3 mL  3 mL Nebulization Q6H PRN Burnell Blanks, MD      . lidocaine (LIDODERM) 5 % 2 patch  2 patch Transdermal Q24H Burnell Blanks, MD   2 patch at 10/28/19 [phone]  . magnesium sulfate (IV Push/IM) injection 40 mEq  40 mEq Other To OR Kikue Gerhart Z, MD      . milrinone (PRIMACOR) 20 MG/100 ML (0.2 mg/mL) infusion  0.3 mcg/kg/min Intravenous To OR Shakara Tweedy Z, MD      . nicotine (NICODERM CQ - dosed in mg/24 hours) patch 21 mg  21 mg Transdermal Q0600 Burnell Blanks, MD   21 mg at 10/28/19 0650  . nitroGLYCERIN 50 mg in dextrose 5 % 250 mL (0.2 mg/mL) infusion  2-200 mcg/min Intravenous To OR Kitrina Maurin, Glenice Bow, MD      . norepinephrine (LEVOPHED) 4mg  in 26mL premix infusion  0-40 mcg/min Intravenous To OR Zubayr Bednarczyk, Glenice Bow, MD      . ondansetron (ZOFRAN) injection 4 mg  4 mg Intravenous Q6H PRN Burnell Blanks, MD      . phenylephrine (NEOSYNEPHRINE) 20-0.9 MG/250ML-% infusion  30-200 mcg/min Intravenous To OR Delorice Bannister Z, MD      . potassium chloride injection 80 mEq  80 mEq Other To OR Boy Delamater, Glenice Bow, MD      . sacubitril-valsartan (ENTRESTO) 49-51 mg per tablet  1 tablet Oral BID Burnell Blanks, MD   1 tablet at 10/28/19 2346  . sodium chloride flush (NS) 0.9 % injection 3 mL  3 mL Intravenous Q12H Burnell Blanks, MD   3 mL at 10/28/19 2349  . sodium chloride flush (NS) 0.9 % injection 3 mL  3 mL Intravenous PRN Lauree Chandler D, MD      . sodium chloride flush (NS) 0.9 % injection 3 mL  3 mL Intravenous Q12H Burnell Blanks, MD   3 mL at 10/28/19 0959  . sodium chloride flush (NS) 0.9 % injection 3 mL  3 mL Intravenous Q12H Burnell Blanks, MD   3 mL at 10/28/19 2348  . sodium chloride flush (NS) 0.9 % injection 3 mL  3 mL Intravenous PRN Burnell Blanks, MD      . spironolactone (ALDACTONE) tablet 12.5 mg  12.5 mg Oral Daily Burnell Blanks, MD   12.5 mg at 10/28/19 1740  . tranexamic acid (CYKLOKAPRON) 2,500 mg in sodium chloride 0.9 % 250 mL (10 mg/mL) infusion  1.5 mg/kg/hr Intravenous To OR Kodah Maret, Glenice Bow, MD      . tranexamic acid (CYKLOKAPRON) bolus via infusion - over 30 minutes 1,275 mg  15 mg/kg Intravenous To OR Madeliene Tejera Z, MD      . tranexamic acid (CYKLOKAPRON) pump prime solution 170 mg  2 mg/kg Intracatheter To OR Belford Pascucci Z, MD      . vancomycin (VANCOREADY) IVPB 1500 mg/300 mL  1,500 mg Intravenous To OR Rashel Okeefe, Glenice Bow, MD        Medications Prior to Admission  Medication Sig Dispense Refill Last Dose  . acetaminophen (TYLENOL) 500 MG tablet Take 1,000 mg by mouth every 6 (six) hours as needed for headache.  Past Week at Unknown time  . aspirin EC 81 MG tablet Take 81 mg by mouth daily.    Past Week at Unknown time  . carvedilol (COREG) 25 MG tablet Take 25 mg by mouth 2 (two) times daily with a meal.   10/24/2019 at 1900  . empagliflozin (JARDIANCE) 25 MG TABS tablet Take 25 mg by mouth daily.   10/24/2019 at Unknown time  . hydrOXYzine (ATARAX/VISTARIL) 25 MG tablet Take 1 tablet (25 mg total) by mouth every 6 (six) hours as needed for itching. 30 tablet 0 Past Week at Unknown time  . ibuprofen (ADVIL,MOTRIN) 200 MG tablet Take 400 mg by mouth every 6 (six) hours as needed for moderate pain.   Past Week at Unknown time  . metFORMIN (GLUCOPHAGE) 1000 MG tablet Take 1,000 mg by mouth 2 (two) times daily with a meal.    10/24/2019 at Unknown  time  . Multiple Vitamins-Minerals (CENTRUM SILVER) tablet Take 1 tablet by mouth daily.   Past Week at Unknown time  . rosuvastatin (CRESTOR) 10 MG tablet Take 10 mg by mouth daily.  3 10/24/2019 at Unknown time  . triamcinolone cream (KENALOG) 0.1 % Apply 1 application topically 2 (two) times daily. (Patient taking differently: Apply 1 application topically 2 (two) times daily as needed (rash). ) 30 g 0 Past Week at Unknown time  . TRULICITY 2.45 YK/9.9IP SOPN Inject 0.75 mg into the skin once a week.   10/15/2019  . Skin Protectants, Misc. (EUCERIN) cream Apply topically daily. (Patient taking differently: Apply 1 application topically daily as needed for dry skin. ) 454 g 0     Family History  Problem Relation Age of Onset  . Diabetes Father   . Kidney failure Father   . Hypertension Father   . Hyperlipidemia Father   . Heart disease Father      Review of Systems:   ROS Pertinent items are noted in HPI.     Cardiac Review of Systems: Y or  [    ]= no  Chest Pain [    ]  Resting SOB [   ] Exertional SOB  [  ]  Orthopnea [  ]   Pedal Edema [   ]    Palpitations [  ] Syncope  [  ]   Presyncope [   ]  General Review of Systems: [Y] = yes [  ]=no Constitional: recent weight change [  ]; anorexia [  ]; fatigue [  ]; nausea [  ]; night sweats [  ]; fever [  ]; or chills [  ]                                                               Dental: Last Dentist visit:   Eye : blurred vision [  ]; diplopia [   ]; vision changes [  ];  Amaurosis fugax[  ]; Resp: cough [  ];  wheezing[  ];  hemoptysis[  ]; shortness of breath[  ]; paroxysmal nocturnal dyspnea[  ]; dyspnea on exertion[  ]; or orthopnea[  ];  GI:  gallstones[  ], vomiting[  ];  dysphagia[  ]; melena[  ];  hematochezia [  ]; heartburn[  ];   Hx of  Colonoscopy[  ];  GU: kidney stones [  ]; hematuria[  ];   dysuria [  ];  nocturia[  ];  history of     obstruction [  ]; urinary frequency [  ]             Skin: rash, swelling[  ];, hair  loss[  ];  peripheral edema[  ];  or itching[  ]; Musculosketetal: myalgias[  ];  joint swelling[  ];  joint erythema[  ];  joint pain[  ];  back pain[  ];  Heme/Lymph: bruising[  ];  bleeding[  ];  anemia[  ];  Neuro: TIA[  ];  headaches[  ];  stroke[  ];  vertigo[  ];  seizures[  ];   paresthesias[  ];  difficulty walking[  ];  Psych:depression[  ]; anxiety[  ];  Endocrine: diabetes[  ];  thyroid dysfunction[  ];          Physical Exam: BP 133/73 (BP Location: Left Arm)   Pulse 75   Temp 98.3 F (36.8 C) (Oral)   Resp 16   Ht 5\' 7"  (1.702 m)   Wt 85 kg Comment: scale b  SpO2 95%   BMI 29.35 kg/m    General appearance: alert and cooperative Neck: no adenopathy, no carotid bruit, no JVD, supple, symmetrical, trachea midline and thyroid not enlarged, symmetric, no tenderness/mass/nodules Resp: clear to auscultation bilaterally Cardio: regular rate and rhythm, S1, S2 normal, no murmur, click, rub or gallop GI: soft, non-tender; bowel sounds normal; no masses,  no organomegaly Extremities: extremities normal, atraumatic, no cyanosis or edema Neurologic: Alert and oriented X 3, normal strength and tone. Normal symmetric reflexes. Normal coordination and gait I have personally performed a modified Allen's test of his left upper extremity using plethysmography and confirmed that when stimulated occlusion of the left radial artery is performed perfusion to the left thumb is intact via the left ulnar artery. Diagnostic Studies & Laboratory data:     Recent Radiology Findings:   DG Chest 2 View  Result Date: 10/27/2019 CLINICAL DATA:  Post right thoracentesis EXAM: CHEST - 2 VIEW COMPARISON:  10/24/2019 FINDINGS: Cardiomegaly with mild central congestion. Decreased right pleural effusion with small residual. No pneumothorax. Posterior lower lobe opacity on lateral view. Aortic atherosclerosis. IMPRESSION: 1. Decreased right pleural effusion with small residual. No pneumothorax. 2.  Cardiomegaly with mild central congestion. 3. Posterior lung base opacity may reflect atelectasis, pneumonia, or mass. Short interval radiographic follow-up to clearing is recommended, alternatively chest CT could be considered. Electronically Signed   By: Donavan Foil M.D.   On: 10/27/2019 15:12   CARDIAC CATHETERIZATION  Result Date: 10/28/2019  Prox RCA lesion is 100% stenosed.  2nd Mrg lesion is 80% stenosed.  Prox Cx to Mid Cx lesion is 80% stenosed.  Ramus lesion is 70% stenosed.  2nd Diag lesion is 90% stenosed.  Mid LAD lesion is 99% stenosed.  1. Severe triple vessel CAD 2. Severe stenosis mid LAD 3. Severe stenosis moderate caliber Diagonal branch 4. Severe stenosis mid Circumflex into the stented segment of the OM branch 5. Severe stenosis moderate caliber intermediate branch 6. Chronic occlusion proximal RCA. The distal vessel fills from right to right and left to right collaterals. 7. Elevated filling pressures. Will resume IV lasix. Will consult CT surgery for CABG.   IR THORACENTESIS ASP PLEURAL SPACE W/IMG GUIDE  Result Date: 10/27/2019 INDICATION: Patient with history acute combined congestive heart failure and AKI presents for therapeutic and diagnostic right-sided thoracentesis  EXAM: ULTRASOUND GUIDED THERAPEUTIC AND DIAGNOSTIC THORACENTESIS MEDICATIONS: Lidocaine 1% 10 mL COMPLICATIONS: None immediate. PROCEDURE: An ultrasound guided thoracentesis was thoroughly discussed with the patient and questions answered. The benefits, risks, alternatives and complications were also discussed. The patient understands and wishes to proceed with the procedure. Written consent was obtained. Ultrasound was performed to localize and mark an adequate pocket of fluid in the right sided chest. The area was then prepped and draped in the normal sterile fashion. 1% Lidocaine was used for local anesthesia. Under ultrasound guidance a 6 Fr Safe-T-Centesis catheter was introduced. Thoracentesis was  performed. The catheter was removed and a dressing applied. FINDINGS: A total of approximately 1 L of straw-colored fluid was removed. Samples were sent to the laboratory as requested by the clinical team. IMPRESSION: Successful ultrasound guided therapeutic and diagnostic right-sided thoracentesis yielding 1 L of pleural fluid. Read by: Rushie Nyhan, NP Electronically Signed   By: Lucrezia Europe M.D.   On: 10/27/2019 15:30     I have independently reviewed the above radiologic studies and discussed with the patient   Recent Lab Findings:     Assessment / Plan:      59 yo man with long h/o CAD now with ICM. Agree with suggestion for CABG as best medical therapy to treat CAD-related HF sx. Tentatively plan CABG tomorrow (7/8) pending completion of work-up including CT chest to eval aortic disease and carotid/peripheral dopplers. He wishes to proceed with this plan.     I  spent 30 minutes counseling the patient face to face.   Shaiden Aldous Z. Orvan Seen, MD [phone] 10/29/2019 12:26 AM

## 2019-10-29 NOTE — Progress Notes (Signed)
Discussed sternal precautions, mobility, and d/c planning with pts NOK, Velvet. Pt is in testing currently. She voices that they will be working together on diet and lifestyle change. He lives with his mother who can be with him at d/c. Other family can check in. Left materials to review. [id number] [place], ACSM 9:35 AM 10/29/2019

## 2019-10-29 NOTE — TOC Progression Note (Signed)
Transition of Care [facility]) - Progression Note    Patient Details  Name: [name] MRN: [id number] Date of Birth: 1961/03/10  Transition of Care [facility]) CM/SW Contact  Zenon Mayo, RN Phone Number: 10/29/2019, 9:57 AM  Clinical Narrative:    Patient for CABG today.        Expected Discharge Plan and Services                                                 Social Determinants of Health (SDOH) Interventions Physical Activity Interventions: Intervention Not Indicated Social Connections Interventions: Intervention Not Indicated  Readmission Risk Interventions No flowsheet data found.

## 2019-10-29 NOTE — Anesthesia Procedure Notes (Signed)
Central Venous Catheter Insertion Performed by: Suzette Battiest, MD, anesthesiologist Start/End7/11/2019 1:30 PM, 10/29/2019 1:45 PM Patient location: Pre-op. Preanesthetic checklist: patient identified, IV checked, site marked, risks and benefits discussed, surgical consent, monitors and equipment checked, pre-op evaluation, timeout performed and anesthesia consent Position: Trendelenburg Lidocaine 1% used for infiltration and patient sedated Hand hygiene performed , maximum sterile barriers used  and Seldinger technique used Catheter size: 9 Fr Total catheter length 10. Central line and PA cath was placed.MAC introducer Swan type:thermodilution PA Cath depth:40 Procedure performed using ultrasound guided technique. Ultrasound Notes:anatomy identified, needle tip was noted to be adjacent to the nerve/plexus identified, no ultrasound evidence of intravascular and/or intraneural injection and image(s) printed for medical record Attempts: 1 Following insertion, line sutured, dressing applied and Biopatch. Post procedure assessment: blood return through all ports, free fluid flow and no air  Patient tolerated the procedure well with no immediate complications.

## 2019-10-29 NOTE — Progress Notes (Signed)
Pre procedure  CBG 121

## 2019-10-29 NOTE — Transfer of Care (Signed)
Immediate Anesthesia Transfer of Care Note  Patient: AMAY MIJANGOS  Procedure(s) Performed: CORONARY ARTERY BYPASS GRAFTING (CABG) using LIMA to LAD; RIMA to PDA; and Left radial harvest vein to Circ. (N/A Chest) RADIAL ARTERY HARVEST (Left Arm Lower) TRANSESOPHAGEAL ECHOCARDIOGRAM (TEE) (N/A )  Patient Location: ICU  Anesthesia Type:General  Level of Consciousness: sedated and Patient remains intubated per anesthesia plan  Airway & Oxygen Therapy: Patient remains intubated per anesthesia plan and Patient placed on Ventilator (see vital sign flow sheet for setting)  Post-op Assessment: Report given to RN and Post -op Vital signs reviewed and stable ABP 130/67; spo2 91% on 100% Fio2 transport with ambu and peep valve  - dr Julien Girt notified; paced  Post vital signs: Reviewed and stable  Last Vitals:  Vitals Value Taken Time  BP    Temp    Pulse    Resp    SpO2      Last Pain:  Vitals:   10/29/19 1303  TempSrc:   PainSc: 2       Patients Stated Pain Goal: 3 (98/26/41 5830)  Complications: No complications documented.

## 2019-10-29 NOTE — Anesthesia Procedure Notes (Signed)
Procedure Name: Intubation Date/Time: 10/29/2019 3:02 PM Performed by: Shirlyn Goltz, CRNA Pre-anesthesia Checklist: Patient identified, Emergency Drugs available, Suction available and Patient being monitored Patient Re-evaluated:Patient Re-evaluated prior to induction Oxygen Delivery Method: Circle system utilized Preoxygenation: Pre-oxygenation with 100% oxygen Induction Type: IV induction Ventilation: Mask ventilation without difficulty and Oral airway inserted - appropriate to patient size Laryngoscope Size: Mac and 4 Grade View: Grade I Tube type: Oral Tube size: 8.0 mm Airway Equipment and Method: Stylet Placement Confirmation: ETT inserted through vocal cords under direct vision,  positive ETCO2 and breath sounds checked- equal and bilateral Secured at: 22 cm Tube secured with: Tape Dental Injury: Teeth and Oropharynx as per pre-operative assessment

## 2019-10-29 NOTE — Progress Notes (Signed)
Pre-CABG US doppler study completed.   Critical results given to Shirlee [place], [place].  See Cv Proc for preliminary results.   [name]

## 2019-10-29 NOTE — Anesthesia Procedure Notes (Signed)
Arterial Line Insertion Start/End7/11/2019 1:45 PM, 10/29/2019 1:55 PM Performed by: Suzette Battiest, MD, anesthesiologist  Patient location: Pre-op. Preanesthetic checklist: patient identified, IV checked, site marked, risks and benefits discussed, surgical consent, monitors and equipment checked, pre-op evaluation, timeout performed and anesthesia consent Lidocaine 1% used for infiltration Right, radial was placed Catheter size: 20 Fr Hand hygiene performed  and maximum sterile barriers used   Attempts: 3 Procedure performed using ultrasound guided technique. Ultrasound Notes:anatomy identified, needle tip was noted to be adjacent to the nerve/plexus identified, no ultrasound evidence of intravascular and/or intraneural injection and image(s) printed for medical record Following insertion, dressing applied and Biopatch. Post procedure assessment: normal and unchanged  Patient tolerated the procedure well with no immediate complications.

## 2019-10-29 NOTE — Progress Notes (Signed)
   Subjective: Patient was in CABG surgery when we visit him. Spoke to his partner about care plan.   Objective:  Vital signs in last 24 hours: Vitals:   10/28/19 2132 10/28/19 2346 10/29/19 0353 10/29/19 0400  BP: (!) 170/93 133/73 (!) 180/102   Pulse: 75  88   Resp:   20   Temp: 98.3 F (36.8 C)  99 F (37.2 C)   TempSrc: Oral  Oral   SpO2: 95%  (!) 88% 93%  Weight:   81.9 kg   Height:        Physical Exam  Patient was not present for examination   Assessment/Plan: [name] is a 59 yo male with PMH of CAD s/p stents, type 2 DM, OSA on CPAP, and HTN, admitted to the hospital for bilateral extremity edema and dyspnea, found to have acute heart failure with reduced EF. R/LHC reveals severe triple vessels disease. Cardiologist will perform CABG procedure.    Principal Problem:   Acute congestive heart failure (HCC) Active Problems:   CAD (coronary artery disease)   Tobacco abuse   Hypertension   Hyperlipidemia   Diabetes mellitus (HCC)   Pleural effusion  Acutedecompensatedheart failure  - Echo: EF 30-35% with regional wall abnormality. Grade III diastolic dysfunction. R ventricular systolic function reduced. Patient has no diagnosis ofheart failure in the past.Given h/o CAD and MI, likely ischemic in origin. - Cardiology is on board with the treatment plan.RHC/LHC reveals severe triple vessels disease. CABG is performed today.  - Continue Entresto, Coreg 3.125 mg, and Spironolactone. Follow BMP daily. - Patient produced4 L of fluid output in the last 24h. Wt 180(dry wt 185). Continue Lasix 80 mg IV BID. Strict I/O. Daily wt - Continue Atorvastatin 40 mg goal LDL < 70 - Continue cardiac monitoring - Patient will follow up with our Internal Medicine clinic after discharge.   PleuralEffusion  - CXR 10/27/19 shows moderate right pleural effusion .  -Thoracentesisdrew 1 L of fluid. Transudate per Light's criteria, which consistent with his HF. Cytology  shows reactive mesothelial cells.  - CTchest 10/29/19 shows moderate pleural effusion, which is likely re-accumulation of fluid. - Continue diuresis with Lasix   HTN - Continue Coreg3.125mg , Entresto, and Spironolactone  - Continue to monitor vitals  Type 2 DM - A1C 10.6 - Continue Jardiance, 10 units Lantus qdand Novolog SSC TID  - Continue to monitor blood glucose - Patient reports taking Trulicity at home. Will continue Metformin, Jardiance and Trulicity when discharged. Patient will follow up with our Internal Medicine clinic after discharge for management of diabetes.  AKI-resolved - Likely secondary to renal congestion  - Baseline Creatine 0.8. - Continue diuresis  - Daily BMP   CAD - Continue ASA and Lipitor  OSA - Continue CPAP  Tobacco cessation - Continue Nicotine patch  XBW:IOMBT healthy diet VTE ppx:Subcutaneous Lovenox IVF: none CODE STATUS:Full code  Prior to Admission Living Arrangement:home Anticipated Discharge Location:home Barriers to Discharge:CABG procedure Dispo: Anticipated discharge in approximately1-2day(s).  Gaylan Gerold, DO 10/29/2019, 8:23 AM Pager: [phone] After 5pm on weekdays and 1pm on weekends: On Call pager [phone]

## 2019-10-29 NOTE — Anesthesia Preprocedure Evaluation (Addendum)
Anesthesia Evaluation  Patient identified by MRN, date of birth, ID band Patient awake    Reviewed: Allergy & Precautions, NPO status , Patient's Chart, lab work & pertinent test results  History of Anesthesia Complications Negative for: history of anesthetic complications  Airway Mallampati: II  TM Distance: >3 FB     Dental  (+) Dental Advisory Given   Pulmonary Current Smoker and Patient abstained from smoking.,    breath sounds clear to auscultation       Cardiovascular hypertension, Pt. on home beta blockers and Pt. on medications + CAD, + Past MI, + Peripheral Vascular Disease and +CHF   Rhythm:Regular Rate:Normal   '21 Cath - Prox RCA lesion is 100% stenosed. 2nd Mrg lesion is 80% stenosed. Prox Cx to Mid Cx lesion is 80% stenosed. Ramus lesion is 70% stenosed. 2nd Diag lesion is 90% stenosed. Mid LAD lesion is 99% stenosed.  '21 TTE - EF 30 to 35%. The mid and distal anterior wall and entire apex are akinetic. The inferior wall, mid anterolateral segment, and basal anterior segment are  hypokinetic. Moderate LVH. Grade III diastolic dysfunction (restrictive). Definity contrast study later excluded apical thrombus (acoustic shadowing and false  tendon noted on initial TTE). RV systolic function is mildly reduced. Moderately elevated PASP, 48.6 mmHg. LA was mild to moderately dilated. RA was mild to moderately dilated. Mild MR. Trivial AI.  '21 Carotid US - In process   Neuro/Psych PSYCHIATRIC DISORDERS Anxiety negative neurological ROS     GI/Hepatic negative GI ROS, Neg liver ROS,   Endo/Other  diabetes, Type 2, Oral Hypoglycemic Agents  Renal/GU negative Renal ROS     Musculoskeletal negative musculoskeletal ROS (+)   Abdominal   Peds  Hematology  INR 1.2    Anesthesia Other Findings Covid test negative   Reproductive/Obstetrics                            Anesthesia  Physical Anesthesia Plan  ASA: IV  Anesthesia Plan: General   Post-op Pain Management:    Induction: Intravenous  PONV Risk Score and Plan: 2 and Treatment may vary due to age or medical condition  Airway Management Planned: Oral ETT  Additional Equipment: Arterial line, CVP, PA Cath, TEE and Ultrasound Guidance Line Placement  Intra-op Plan:   Post-operative Plan: Post-operative intubation/ventilation  Informed Consent: I have reviewed the patients History and Physical, chart, labs and discussed the procedure including the risks, benefits and alternatives for the proposed anesthesia with the patient or authorized representative who has indicated his/her understanding and acceptance.     Dental advisory given  Plan Discussed with: CRNA and Anesthesiologist  Anesthesia Plan Comments:        Anesthesia Quick Evaluation

## 2019-10-30 ENCOUNTER — Inpatient Hospital Stay (HOSPITAL_COMMUNITY): Payer: Medicaid Other

## 2019-10-30 ENCOUNTER — Encounter (HOSPITAL_COMMUNITY): Payer: Self-pay | Admitting: Cardiothoracic Surgery

## 2019-10-30 DIAGNOSIS — I6521 Occlusion and stenosis of right carotid artery: Secondary | ICD-10-CM

## 2019-10-30 DIAGNOSIS — I25110 Atherosclerotic heart disease of native coronary artery with unstable angina pectoris: Secondary | ICD-10-CM

## 2019-10-30 LAB — POCT I-STAT 7, (LYTES, BLD GAS, ICA,H+H)
Acid-base deficit: 3 mmol/L — ABNORMAL HIGH (ref 0.0–2.0)
Acid-base deficit: 4 mmol/L — ABNORMAL HIGH (ref 0.0–2.0)
Acid-base deficit: 5 mmol/L — ABNORMAL HIGH (ref 0.0–2.0)
Acid-base deficit: 6 mmol/L — ABNORMAL HIGH (ref 0.0–2.0)
Bicarbonate: 21 mmol/L (ref 20.0–28.0)
Bicarbonate: 21.7 mmol/L (ref 20.0–28.0)
Bicarbonate: 22.2 mmol/L (ref 20.0–28.0)
Bicarbonate: 22.3 mmol/L (ref 20.0–28.0)
Calcium, Ion: 1.2 mmol/L (ref 1.15–1.40)
Calcium, Ion: 1.22 mmol/L (ref 1.15–1.40)
Calcium, Ion: 1.23 mmol/L (ref 1.15–1.40)
Calcium, Ion: 1.27 mmol/L (ref 1.15–1.40)
HCT: 48 % (ref 39.0–52.0)
HCT: 48 % (ref 39.0–52.0)
HCT: 48 % (ref 39.0–52.0)
HCT: 49 % (ref 39.0–52.0)
Hemoglobin: 16.3 g/dL (ref 13.0–17.0)
Hemoglobin: 16.3 g/dL (ref 13.0–17.0)
Hemoglobin: 16.3 g/dL (ref 13.0–17.0)
Hemoglobin: 16.7 g/dL (ref 13.0–17.0)
O2 Saturation: 92 %
O2 Saturation: 95 %
O2 Saturation: 96 %
O2 Saturation: 97 %
Patient temperature: 37.2
Patient temperature: 37.3
Patient temperature: 37.4
Patient temperature: 37.6
Potassium: 4.2 mmol/L (ref 3.5–5.1)
Potassium: 4.3 mmol/L (ref 3.5–5.1)
Potassium: 4.4 mmol/L (ref 3.5–5.1)
Potassium: 4.5 mmol/L (ref 3.5–5.1)
Sodium: 144 mmol/L (ref 135–145)
Sodium: 145 mmol/L (ref 135–145)
Sodium: 146 mmol/L — ABNORMAL HIGH (ref 135–145)
Sodium: 146 mmol/L — ABNORMAL HIGH (ref 135–145)
TCO2: 22 mmol/L (ref 22–32)
TCO2: 23 mmol/L (ref 22–32)
TCO2: 24 mmol/L (ref 22–32)
TCO2: 24 mmol/L (ref 22–32)
pCO2 arterial: 41.6 mmHg (ref 32.0–48.0)
pCO2 arterial: 43.1 mmHg (ref 32.0–48.0)
pCO2 arterial: 47.7 mmHg (ref 32.0–48.0)
pCO2 arterial: 48.5 mmHg — ABNORMAL HIGH (ref 32.0–48.0)
pH, Arterial: 7.254 — ABNORMAL LOW (ref 7.350–7.450)
pH, Arterial: 7.271 — ABNORMAL LOW (ref 7.350–7.450)
pH, Arterial: 7.312 — ABNORMAL LOW (ref 7.350–7.450)
pH, Arterial: 7.339 — ABNORMAL LOW (ref 7.350–7.450)
pO2, Arterial: 75 mmHg — ABNORMAL LOW (ref 83.0–108.0)
pO2, Arterial: 88 mmHg (ref 83.0–108.0)
pO2, Arterial: 88 mmHg (ref 83.0–108.0)
pO2, Arterial: 96 mmHg (ref 83.0–108.0)

## 2019-10-30 LAB — GLUCOSE, CAPILLARY
Glucose-Capillary: 125 mg/dL — ABNORMAL HIGH (ref 70–99)
Glucose-Capillary: 147 mg/dL — ABNORMAL HIGH (ref 70–99)
Glucose-Capillary: 108 mg/dL — ABNORMAL HIGH (ref 70–99)
Glucose-Capillary: 187 mg/dL — ABNORMAL HIGH (ref 70–99)
Glucose-Capillary: 118 mg/dL — ABNORMAL HIGH (ref 70–99)
Glucose-Capillary: 166 mg/dL — ABNORMAL HIGH (ref 70–99)
Glucose-Capillary: 162 mg/dL — ABNORMAL HIGH (ref 70–99)
Glucose-Capillary: 158 mg/dL — ABNORMAL HIGH (ref 70–99)
Glucose-Capillary: 121 mg/dL — ABNORMAL HIGH (ref 70–99)
Glucose-Capillary: 142 mg/dL — ABNORMAL HIGH (ref 70–99)
Glucose-Capillary: 156 mg/dL — ABNORMAL HIGH (ref 70–99)
Glucose-Capillary: 133 mg/dL — ABNORMAL HIGH (ref 70–99)
Glucose-Capillary: 193 mg/dL — ABNORMAL HIGH (ref 70–99)
Glucose-Capillary: 155 mg/dL — ABNORMAL HIGH (ref 70–99)
Glucose-Capillary: 133 mg/dL — ABNORMAL HIGH (ref 70–99)
Glucose-Capillary: 117 mg/dL — ABNORMAL HIGH (ref 70–99)

## 2019-10-30 LAB — CBC
HCT: 50.6 % (ref 39.0–52.0)
HCT: 44 % (ref 39.0–52.0)
Hemoglobin: 16 g/dL (ref 13.0–17.0)
Hemoglobin: 13.8 g/dL (ref 13.0–17.0)
MCH: 29.1 pg (ref 26.0–34.0)
MCH: 28.4 pg (ref 26.0–34.0)
MCHC: 31.6 g/dL (ref 30.0–36.0)
MCHC: 31.4 g/dL (ref 30.0–36.0)
MCV: 92.2 fL (ref 80.0–100.0)
MCV: 90.5 fL (ref 80.0–100.0)
Platelets: 190 10*3/uL (ref 150–400)
Platelets: 156 10*3/uL (ref 150–400)
RBC: 5.49 MIL/uL (ref 4.22–5.81)
RBC: 4.86 MIL/uL (ref 4.22–5.81)
RDW: 15 % (ref 11.5–15.5)
RDW: 15.2 % (ref 11.5–15.5)
WBC: 16.4 10*3/uL — ABNORMAL HIGH (ref 4.0–10.5)
WBC: 16.3 10*3/uL — ABNORMAL HIGH (ref 4.0–10.5)
nRBC: 0 % (ref 0.0–0.2)
nRBC: 0 % (ref 0.0–0.2)

## 2019-10-30 LAB — COOXEMETRY PANEL
Carboxyhemoglobin: 1.8 % — ABNORMAL HIGH (ref 0.5–1.5)
Methemoglobin: 1.1 % (ref 0.0–1.5)
O2 Saturation: 67.7 %
Total hemoglobin: 15.5 g/dL (ref 12.0–16.0)

## 2019-10-30 LAB — BASIC METABOLIC PANEL
Anion gap: 9 (ref 5–15)
Anion gap: 7 (ref 5–15)
BUN: 17 mg/dL (ref 6–20)
BUN: 16 mg/dL (ref 6–20)
CO2: 20 mmol/L — ABNORMAL LOW (ref 22–32)
CO2: 24 mmol/L (ref 22–32)
Calcium: 8.3 mg/dL — ABNORMAL LOW (ref 8.9–10.3)
Calcium: 8 mg/dL — ABNORMAL LOW (ref 8.9–10.3)
Chloride: 114 mmol/L — ABNORMAL HIGH (ref 98–111)
Chloride: 110 mmol/L (ref 98–111)
Creatinine, Ser: 1.02 mg/dL (ref 0.61–1.24)
Creatinine, Ser: 0.9 mg/dL (ref 0.61–1.24)
GFR calc Af Amer: >60 mL/min (ref >60)
GFR calc Af Amer: >60 mL/min (ref >60)
GFR calc non Af Amer: >60 mL/min (ref >60)
GFR calc non Af Amer: >60 mL/min (ref >60)
Glucose, Bld: 138 mg/dL — ABNORMAL HIGH (ref 70–99)
Glucose, Bld: 140 mg/dL — ABNORMAL HIGH (ref 70–99)
Potassium: 4.7 mmol/L (ref 3.5–5.1)
Potassium: 4 mmol/L (ref 3.5–5.1)
Sodium: 143 mmol/L (ref 135–145)
Sodium: 141 mmol/L (ref 135–145)

## 2019-10-30 LAB — MAGNESIUM
Magnesium: 3 mg/dL — ABNORMAL HIGH (ref 1.7–2.4)
Magnesium: 2.4 mg/dL (ref 1.7–2.4)

## 2019-10-30 MED ORDER — ACETAMINOPHEN 160 MG/5ML PO SOLN: 1000.0000 mg | Freq: Four times a day (QID) | ORAL | Status: AC

## 2019-10-30 MED ORDER — THIAMINE HCL 100 MG/ML IJ SOLN
Freq: Once | INTRAVENOUS | Status: AC
  Filled 2019-10-30: qty 1000

## 2019-10-30 MED ORDER — CLOPIDOGREL BISULFATE 75 MG PO TABS
75.0000 mg | ORAL_TABLET | Freq: Every day | ORAL | Status: DC
  Administered 2019-10-30 – 2019-11-06 (×8): 75 mg via ORAL
  Filled 2019-10-30 (×8): qty 1

## 2019-10-30 MED ORDER — LEVALBUTEROL HCL 0.63 MG/3ML IN NEBU
0.6300 mg | INHALATION_SOLUTION | Freq: Four times a day (QID) | RESPIRATORY_TRACT | Status: DC
  Administered 2019-10-30 (×3): 0.63 mg via RESPIRATORY_TRACT
  Filled 2019-10-30 (×3): qty 3

## 2019-10-30 MED ORDER — ACETAMINOPHEN 10 MG/ML IV SOLN
1000.0000 mg | Freq: Four times a day (QID) | INTRAVENOUS | Status: AC
  Administered 2019-10-30 – 2019-10-31 (×3): 1000 mg via INTRAVENOUS
  Filled 2019-10-30 (×3): qty 100

## 2019-10-30 MED ORDER — ACETAMINOPHEN 10 MG/ML IV SOLN
1000.0000 mg | Freq: Four times a day (QID) | INTRAVENOUS | Status: DC
  Administered 2019-10-30: 1000 mg via INTRAVENOUS
  Filled 2019-10-30 (×3): qty 100

## 2019-10-30 MED ORDER — ASPIRIN EC 81 MG PO TBEC
81.0000 mg | DELAYED_RELEASE_TABLET | Freq: Every day | ORAL | Status: DC
  Administered 2019-10-30 – 2019-11-05 (×7): 81 mg via ORAL
  Filled 2019-10-30 (×7): qty 1

## 2019-10-30 MED ORDER — SODIUM BICARBONATE 8.4 % IV SOLN
100.0000 meq | Freq: Once | INTRAVENOUS | Status: AC
  Administered 2019-10-30: 100 meq via INTRAVENOUS
  Filled 2019-10-30: qty 50

## 2019-10-30 MED ORDER — ACETAMINOPHEN 500 MG PO TABS
1000.0000 mg | ORAL_TABLET | Freq: Four times a day (QID) | ORAL | Status: AC
  Administered 2019-10-31 – 2019-11-04 (×17): 1000 mg via ORAL
  Filled 2019-10-30 (×17): qty 2

## 2019-10-30 MED ORDER — ORAL CARE MOUTH RINSE
15.0000 mL | Freq: Two times a day (BID) | OROMUCOSAL | Status: DC
  Administered 2019-10-30 – 2019-10-31 (×4): 15 mL via OROMUCOSAL

## 2019-10-30 MED ORDER — ISOSORBIDE DINITRATE 10 MG PO TABS
10.0000 mg | ORAL_TABLET | Freq: Three times a day (TID) | ORAL | Status: DC
  Administered 2019-10-30 – 2019-11-02 (×12): 10 mg via ORAL
  Filled 2019-10-30 (×12): qty 1

## 2019-10-30 NOTE — Procedures (Signed)
Extubation Procedure Note  Patient Details:   Name: [name] DOB: 04-Apr-1961 MRN: [id number]   Airway Documentation:    Vent end date: (not recorded) Vent end time: (not recorded)   Evaluation  O2 sats: stable throughout Complications: No apparent complications Patient did tolerate procedure well. Bilateral Breath Sounds: Lee, Diminished   Yes   Weaning Mechanics done prior to extubation  VC: 675ml NIF -24, positive cuff leak and extubated to 6L NCAN. Pt voice hoarse   Myrtis Ser 10/30/2019, 2:13 AM

## 2019-10-30 NOTE — Progress Notes (Signed)
Progress Note  Patient Name: [name] Date of Encounter: 10/30/2019  [facility] Cardiologist: Kirk Ruths, MD   Subjective   Awake on CPAP. Sore.   Inpatient Medications    Scheduled Meds: . acetaminophen  1,000 mg Oral Q6H   Or  . acetaminophen (TYLENOL) oral liquid 160 mg/5 mL  1,000 mg Per Tube Q6H  . aspirin EC  81 mg Oral Daily  . atorvastatin  40 mg Oral Daily  . bisacodyl  10 mg Oral Daily   Or  . bisacodyl  10 mg Rectal Daily  . chlorhexidine gluconate (MEDLINE KIT)  15 mL Mouth Rinse BID  . Chlorhexidine Gluconate Cloth  6 each Topical Daily  . clopidogrel  75 mg Oral Daily  . docusate sodium  200 mg Oral Daily  . lidocaine  2 patch Transdermal Q24H  . mouth rinse  15 mL Mouth Rinse 10 times per day  . mouth rinse  15 mL Mouth Rinse BID  . metoCLOPramide (REGLAN) injection  10 mg Intravenous Q6H  . metoprolol tartrate  12.5 mg Oral BID   Or  . metoprolol tartrate  12.5 mg Per Tube BID  . mupirocin ointment  1 application Nasal BID  . [START ON 10/31/2019] pantoprazole  40 mg Oral Daily  . sodium chloride flush  10-40 mL Intracatheter Q12H  . sodium chloride flush  3 mL Intravenous Q12H   Continuous Infusions: . sodium chloride 10 mL/hr at 10/30/19 0700  . sodium chloride    . sodium chloride 20 mL/hr at 10/29/19 2134  . acetaminophen    . albumin human 12.5 g (10/29/19 2342)  . cefUROXime (ZINACEF)  IV Stopped (10/29/19 2319)  . dexmedetomidine (PRECEDEX) IV infusion Stopped (10/30/19 0059)  . epinephrine 10 mcg/min (10/30/19 0700)  . famotidine (PEPCID) IV Stopped (10/29/19 2120)  . insulin 2.8 mL/hr at 10/30/19 0700  . lactated ringers    . lactated ringers    . lactated ringers Stopped (10/30/19 0601)  . milrinone 0.375 mcg/kg/min (10/30/19 0700)  . nitroGLYCERIN 5 mcg/min (10/30/19 0700)  . norepinephrine (LEVOPHED) Adult infusion    . phenylephrine (NEO-SYNEPHRINE) Adult infusion     PRN Meds: sodium chloride, albumin human,  dextrose, HYDROmorphone, ipratropium-albuterol, lactated ringers, metoprolol tartrate, midazolam, morphine injection, ondansetron (ZOFRAN) IV, sodium chloride flush, sodium chloride flush   Vital Signs    Vitals:   10/30/19 0630 10/30/19 0645 10/30/19 0700 10/30/19 0739  BP:   102/64 104/67  Pulse: 86 86 86 85  Resp:  19  12  Temp: 99.5 F (37.5 C) 99.3 F (37.4 C) 99.3 F (37.4 C) 99 F (37.2 C)  TempSrc:      SpO2: 99% 100% 100% 100%  Weight:      Height:        Intake/Output Summary (Last 24 hours) at 10/30/2019 0746 Last data filed at 10/30/2019 0700 Gross per 24 hour  Intake 5635.09 ml  Output 4285 ml  Net 1350.09 ml   Last 3 Weights 10/30/2019 10/29/2019 10/29/2019  Weight (lbs) 188 lb 4.4 oz 180 lb 8.9 oz 180 lb 8 oz  Weight (kg) 85.4 kg 81.9 kg 81.874 kg      Telemetry    Atrial paced - Personally Reviewed  ECG    NSR. Low voltage. 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   Physical Exam   GEN: No acute distress.  On CPAP Neck: IJ in place with Swan Cardiac: RRR, no murmurs, rubs, or gallops.  Respiratory: Clear anteriorly GI:  Soft, nontender, non-distended  MS: 1+ BL LE  edema; No deformity. Neuro:  Nonfocal  Psych: Normal affect   Labs    High Sensitivity Troponin:   Recent Labs  Lab 10/24/19 2011 10/24/19 2218  TROPONINIHS 17 19*      Chemistry Recent Labs  Lab 10/26/19 0142 10/27/19 0428 10/27/19 1700 10/28/19 0845 10/28/19 1634 10/29/19 0508 10/29/19 1528 10/29/19 1850 10/29/19 1850 10/29/19 1919 10/29/19 2034 10/30/19 0254 10/30/19 0308 10/30/19 0506  NA 139   < >  --  142   < > 140   < > 143   < > 144   < > 143 144 145  K 3.7   < >  --  4.0   < > 3.8   < > 4.7   < > 3.9   < > 4.7 4.4 4.5  CL 102   < >  --  104  --  106   < > 106  --  106  --  114*  --   --   CO2 27   < >  --  26  --  23  --   --   --   --   --  20*  --   --   GLUCOSE 225*   < >  --  131*  --  256*   < > 97  --  104*  --  138*  --   --   BUN 20   < >  --  19  --  21*    < > 18  --  19  --  17  --   --   CREATININE 1.25*   < >  --  1.16  --  1.08   < > 0.70  --  0.70  --  1.02  --   --   CALCIUM 8.6*   < >  --  9.0  --  8.9  --   --   --   --   --  8.3*  --   --   PROT 6.1*  --  6.4*  --   --  6.4*  --   --   --   --   --   --   --   --   ALBUMIN 3.2*  --   --   --   --  3.1*  --   --   --   --   --   --   --   --   AST 27  --   --   --   --  29  --   --   --   --   --   --   --   --   ALT 26  --   --   --   --  29  --   --   --   --   --   --   --   --   ALKPHOS 106  --   --   --   --  120  --   --   --   --   --   --   --   --   BILITOT 1.2  --   --   --   --  1.6*  --   --   --   --   --   --   --   --   GFRNONAA >60   < >  --  >  60  --  >60  --   --   --   --   --  >60  --   --   GFRAA >60   < >  --  >60  --  >60  --   --   --   --   --  >60  --   --   ANIONGAP 10   < >  --  12  --  11  --   --   --   --   --  9  --   --    < > = values in this interval not displayed.     Hematology Recent Labs  Lab 10/29/19 0508 10/29/19 1528 10/29/19 1820 10/29/19 1823 10/29/19 2033 10/29/19 2034 10/30/19 0254 10/30/19 0308 10/30/19 0506  WBC 8.3  --   --   --  14.7*  --  16.4*  --   --   RBC 6.59*  --   --   --  6.10*  --  5.49  --   --   HGB 18.7*   < > 15.7   < > 17.7*   < > 16.0 16.3 16.3  HCT 58.6*   < > 48.5   < > 55.4*   < > 50.6 48.0 48.0  MCV 88.9  --   --   --  90.8  --  92.2  --   --   MCH 28.4  --   --   --  29.0  --  29.1  --   --   MCHC 31.9  --   --   --  31.9  --  31.6  --   --   RDW 15.9*  --   --   --  15.1  --  15.0  --   --   PLT 195  --  145*  --  180  --  190  --   --    < > = values in this interval not displayed.    BNP Recent Labs  Lab 10/24/19 2012  BNP 1,913.6*     Radiology    CT CHEST WO CONTRAST  Result Date: 10/29/2019 CLINICAL DATA:  Preoperative evaluation for upcoming coronary bypass grafting EXAM: CT CHEST WITHOUT CONTRAST TECHNIQUE: Multidetector CT imaging of the chest was performed following the standard  protocol without IV contrast. COMPARISON:  10/27/2019 FINDINGS: Cardiovascular: Limited due to lack of IV contrast. Mild aortic atherosclerotic calcifications are noted. No aneurysmal dilatation or signs of dissection are noted. Coronary calcifications are seen. Mild cardiac enlargement is noted. Some calcifications are noted along the left ventricular apex which may be related to scarring. No pericardial effusion is seen. Mediastinum/Nodes: Thoracic inlet is within normal limits. Scattered small mediastinal lymph nodes are noted but not significant by size criteria. The esophagus is within normal limits. Lungs/Pleura: Right-sided pleural effusion is seen. This is moderate in size. No focal infiltrate or sizable pneumothorax is seen. No parenchymal nodules are noted. Upper Abdomen: Visualized upper abdomen shows no acute abnormality. Musculoskeletal: Degenerative changes of the thoracic spine are noted. IMPRESSION: Moderate right-sided pleural effusion. No associated infiltrate is noted. Atherosclerotic calcifications of the thoracic aorta without aneurysmal dilatation. Heavy coronary calcifications consistent with the given clinical history of upcoming coronary bypass grafting. Electronically Signed   By: Inez Catalina M.D.   On: 10/29/2019 02:48   CARDIAC CATHETERIZATION  Result Date: 10/28/2019  Prox RCA lesion is 100% stenosed.  2nd Mrg lesion  is 80% stenosed.  Prox Cx to Mid Cx lesion is 80% stenosed.  Ramus lesion is 70% stenosed.  2nd Diag lesion is 90% stenosed.  Mid LAD lesion is 99% stenosed.  1. Severe triple vessel CAD 2. Severe stenosis mid LAD 3. Severe stenosis moderate caliber Diagonal branch 4. Severe stenosis mid Circumflex into the stented segment of the OM branch 5. Severe stenosis moderate caliber intermediate branch 6. Chronic occlusion proximal RCA. The distal vessel fills from right to right and left to right collaterals. 7. Elevated filling pressures. Will resume IV lasix. Will  consult CT surgery for CABG.   DG Chest Port 1 View  Result Date: 10/30/2019 CLINICAL DATA:  CHF.  CABG. EXAM: PORTABLE CHEST 1 VIEW COMPARISON:  10/29/2019. FINDINGS: Interim extubation and removal of NG tube. Swan-Ganz catheter, mediastinal drainage catheters, bilateral chest tubes in stable position. No pneumothorax. Prior CABG. Stable cardiomegaly. Low lung volumes with bibasilar atelectasis. Left base infiltrate cannot be excluded. Mild bilateral interstitial prominence suggesting mild interstitial edema. Tiny left pleural effusion. IMPRESSION: 1. Interim extubation removal of NG tube. Remaining lines and tubes including bilateral chest tubes in stable position. No pneumothorax. 2. Low lung volumes with bibasilar atelectasis. Left base infiltrate cannot be excluded. Mild bilateral interstitial prominence suggesting mild interstitial edema. Tiny left pleural effusion. 3.  Prior CABG.  Stable cardiomegaly. Electronically Signed   By: Marcello Moores  Register   On: 10/30/2019 07:18   DG Chest Port 1 View  Result Date: 10/29/2019 CLINICAL DATA:  Post CABG today. EXAM: PORTABLE CHEST 1 VIEW COMPARISON:  Preoperative radiograph earlier today. FINDINGS: Endotracheal tube tip at the clavicular heads. Enteric tube tip and side-port below the diaphragm in the stomach. Right internal jugular Swan-Ganz catheter tip in the region of the main pulmonary outflow tract. Bilateral chest tubes and mediastinal drains in place. Interval median sternotomy and CABG. Similar heart size and unchanged mediastinal contours. Aortic atherosclerosis. No pulmonary edema or pneumothorax. Retrocardiac atelectasis with small pleural effusion. Small right pleural effusion. IMPRESSION: 1. Post recent CABG. Support apparatus as described. 2. Retrocardiac atelectasis with small bilateral pleural effusions. Electronically Signed   By: Keith Rake M.D.   On: 10/29/2019 20:59   VAS US DOPPLER PRE CABG  Result Date: 10/29/2019 PREOPERATIVE  VASCULAR EVALUATION  Indications:      Pre-CABG. Risk Factors:     Hypertension, hyperlipidemia, Diabetes, current smoker. Comparison Study: Prior ABI 06-19-2018 Performing Technologist: Darlin Coco  Examination Guidelines: A complete evaluation includes B-mode imaging, spectral Doppler, color Doppler, and power Doppler as needed of all accessible portions of each vessel. Bilateral testing is considered an integral part of a complete examination. Limited examinations for reoccurring indications may be performed as noted.  Right Carotid Findings: +----------+--------+--------+--------+----------+------------------+           PSV cm/sEDV cm/sStenosisDescribe  Comments           +----------+--------+--------+--------+----------+------------------+ CCA Prox  71                                intimal thickening +----------+--------+--------+--------+----------+------------------+ CCA Distal55      7               calcific  intimal thickening +----------+--------+--------+--------+----------+------------------+ ICA Prox  407     108     80-99%  hypoechoic                   +----------+--------+--------+--------+----------+------------------+ ICA Mid   299  35                                           +----------+--------+--------+--------+----------+------------------+ ICA Distal195     14                                           +----------+--------+--------+--------+----------+------------------+ ECA       147                                                  +----------+--------+--------+--------+----------+------------------+ Portions of this table do not appear on this page. +----------+--------+-------+----------------+------------+           PSV cm/sEDV cmsDescribe        Arm Pressure +----------+--------+-------+----------------+------------+ Subclavian97             Multiphasic, WNL              +----------+--------+-------+----------------+------------+ +---------+--------+--+--------+-+---------+ VertebralPSV cm/s45EDV cm/s8Antegrade +---------+--------+--+--------+-+---------+ Left Carotid Findings: +----------+--------+--------+--------+--------+------------------+           PSV cm/sEDV cm/sStenosisDescribeComments           +----------+--------+--------+--------+--------+------------------+ CCA Prox  75      9                       intimal thickening +----------+--------+--------+--------+--------+------------------+ CCA Distal80      12                      intimal thickening +----------+--------+--------+--------+--------+------------------+ ICA Prox  212     36      40-59%  calcific                   +----------+--------+--------+--------+--------+------------------+ ICA Mid   121     21                                         +----------+--------+--------+--------+--------+------------------+ ICA Distal71      15                                         +----------+--------+--------+--------+--------+------------------+ ECA       163                                                +----------+--------+--------+--------+--------+------------------+ +----------+--------+--------+----------------+------------+ SubclavianPSV cm/sEDV cm/sDescribe        Arm Pressure +----------+--------+--------+----------------+------------+           169             Multiphasic, WNL             +----------+--------+--------+----------------+------------+ +---------+--------+--+--------+--+---------+ VertebralPSV cm/s64EDV cm/s11Antegrade +---------+--------+--+--------+--+---------+  ABI Findings: +--------+------------------+-----+----------+--------+ Right   Rt Pressure (mmHg)IndexWaveform  Comment  +--------+------------------+-----+----------+--------+ [id number]                    triphasic           +--------+------------------+-----+----------+--------+  PTA     114               0.72 monophasic         +--------+------------------+-----+----------+--------+ DP      125               0.79 monophasic         +--------+------------------+-----+----------+--------+ +--------+------------------+-----+----------+-------+ Left    Lt Pressure (mmHg)IndexWaveform  Comment +--------+------------------+-----+----------+-------+ [id number]                    triphasic         +--------+------------------+-----+----------+-------+ PTA     145               0.92 monophasic        +--------+------------------+-----+----------+-------+ DP      120               0.76 monophasic        +--------+------------------+-----+----------+-------+ +-------+---------------+----------------+ ABI/TBIToday's ABI/TBIPrevious ABI/TBI +-------+---------------+----------------+ Right  0.79           1.13             +-------+---------------+----------------+ Left   0.92           0.89             +-------+---------------+----------------+  Right Doppler Findings: +--------+--------+-----+---------+--------+ Site    PressureIndexDoppler  Comments +--------+--------+-----+---------+--------+ [id number]          triphasic         +--------+--------+-----+---------+--------+ Radial               triphasic         +--------+--------+-----+---------+--------+ Ulnar                triphasic         +--------+--------+-----+---------+--------+  Left Doppler Findings: +--------+--------+-----+---------+--------+ Site    PressureIndexDoppler  Comments +--------+--------+-----+---------+--------+ [id number]          triphasic         +--------+--------+-----+---------+--------+ Radial               triphasic         +--------+--------+-----+---------+--------+ Ulnar                triphasic         +--------+--------+-----+---------+--------+  Summary:  Right Carotid: Velocities in the right ICA are consistent with a 80-99%                stenosis. Left Carotid: Velocities in the left ICA are consistent with a 40-59% stenosis. Vertebrals:  Bilateral vertebral arteries demonstrate antegrade flow. Subclavians: Normal flow hemodynamics were seen in bilateral subclavian              arteries. Right ABI: Resting right ankle-brachial index indicates moderate right lower extremity arterial disease. Left ABI: Resting left ankle-brachial index indicates mild left lower extremity arterial disease. Right Upper Extremity: Doppler waveforms decrease <50% with right radial compression. Doppler waveform obliterate with right ulnar compression. Left Upper Extremity: Doppler waveforms remain within normal limits with left radial compression. Doppler waveforms remain within normal limits with left ulnar compression.  Electronically signed by Servando Snare MD on 10/29/2019 at 8:19:50 PM.    Final     Cardiac Studies   Limited Echo 10/26/19 1. No formed LV mural thrombus noted with Definity contrast.   FINDINGS  Left Ventricle: No formed LV mural thrombus noted with Definity contrast.   Echo 10/25/19 1. Left ventricular ejection fraction, by estimation, is 30  to 35%. The  left ventricle has moderately decreased function. The left ventricle  demonstrates regional wall motion abnormalities (see scoring  diagram/findings for description). There is  moderate left ventricular hypertrophy. Left ventricular diastolic  parameters are consistent with Grade III diastolic dysfunction  (restrictive). Suggest Definity contrast study to more effectively exclude  apical thrombus - acoustic shadowing and false  tendon noted.  2. Right ventricular systolic function is mildly reduced. The right  ventricular size is normal. There is moderately elevated pulmonary artery  systolic pressure. The estimated right ventricular systolic pressure is  01.0 mmHg.  3. Left atrial size was mild  to moderately dilated.  4. Right atrial size was mild to moderately dilated.  5. The mitral valve is abnormal, mildly thickened and with annular  calcification. Mild mitral valve regurgitation.  6. The tricuspid valve is abnormal.  7. The aortic valve is tricuspid. Aortic valve regurgitation is trivial.  8. The inferior vena cava is dilated in size with <50% respiratory  variability, suggesting right atrial pressure of 15 mmHg.   RIGHT/LEFT HEART CATH AND CORONARY ANGIOGRAPHY  Conclusion    Prox RCA lesion is 100% stenosed.  2nd Mrg lesion is 80% stenosed.  Prox Cx to Mid Cx lesion is 80% stenosed.  Ramus lesion is 70% stenosed.  2nd Diag lesion is 90% stenosed.  Mid LAD lesion is 99% stenosed.   1. Severe triple vessel CAD 2. Severe stenosis mid LAD 3. Severe stenosis moderate caliber Diagonal branch 4. Severe stenosis mid Circumflex into the stented segment of the OM branch 5. Severe stenosis moderate caliber intermediate branch 6. Chronic occlusion proximal RCA. The distal vessel fills from right to right and left to right collaterals.  7. Elevated filling pressures.   Will resume IV lasix. Will consult CT surgery for CABG.   Summary:  Right Carotid: Velocities in the right ICA are consistent with a 80-99%         stenosis.   Left Carotid: Velocities in the left ICA are consistent with a 40-59%  stenosis.  Vertebrals: Bilateral vertebral arteries demonstrate antegrade flow.  Subclavians: Normal flow hemodynamics were seen in bilateral subclavian        arteries.   Right ABI: Resting right ankle-brachial index indicates moderate right  lower extremity arterial disease.  Left ABI: Resting left ankle-brachial index indicates mild left lower  extremity arterial disease.  Right Upper Extremity: Doppler waveforms decrease <50% with right radial  compression. Doppler waveform obliterate with right ulnar compression.  Left Upper Extremity:  Doppler waveforms remain within normal limits with  left radial compression. Doppler waveforms remain within normal limits  with left ulnar compression.       Electronically signed by Servando Snare MD   Patient Profile     ATTILIO ZEITLER is a 59 y.o. male with a hx of CAD, (acute MI 2005 with PTCA to Diag, then stent to LCX in 2007 , last cath 2008 with 20% LM, 50% LAD and 70%mLAD, ? Hx of EF 29% on myoview in 2013 but echo in 2013 with normal EF (60-65%), HTN, DM-2, HLD, + tobacco, chronic pain , PAD  who is being seen  for the evaluation of cardiomyopathy at the request of Dr. Rebeca Alert.  Assessment & Plan    1. Acute combined CHF - Echo this admission showed LVEF of 30-35% and grade III DD (EF was 60-65% in 2013). Due to ischemic CM. No LV thrombus on limited echo.  - Diuresed aggressively prior to CABG  with weight down 26 lbs. Also had thoracentesis. - Weight down 26 lbs - now post op day #1 CABG.  - on IV milrinone and Epinephrine.  - low dose IV Ntg for radial graft. - diuresis - Co ox 67%  - PAP 45/18 - CVP 12.  - once off pressors will gradually transition back to Entresto, Coreg, aldactone.   2. AKi - Resolved - Followed closely - monitor with change in meds.   3. HTN - controlled.    4. CAD severe 3 vessel disease. Now s/p CABG - Continue ASA and statin  5. HLD - 10/27/2019: Cholesterol 135; HDL 34; LDL Cholesterol 83; Triglycerides 89; VLDL 18  - Increase Lipitor to 32m qd  6. DM - Per primary team   7. Tobacco abuse - Recommended cessation - On nicotine patch  8. PAD - Followed by vascular- Dr BTrula Slade- has severe right carotid stenosis that will need to be addressed when recovered from CABG.  - On ASA and statin   For questions or updates, please contact COnidaPlease consult [email or web address] for contact info under        Signed, Yariel Ferraris JMartinique MD  10/30/2019, 7:46 AM

## 2019-10-30 NOTE — Discharge Summary (Signed)
Physician Discharge Summary  Patient ID: [name] MRN: [id number] DOB/AGE: 10-01-1960 59 y.o.  Admit date: 10/24/2019 Discharge date: 11/06/2019  Admission Diagnoses:  Acute CHF  Discharge Diagnoses:  Principal Problem:   Acute congestive heart failure ([place]) Active Problems:   CAD (coronary artery disease)   Tobacco abuse   Hypertension   Hyperlipidemia   Diabetes mellitus ([place])   Acute combined systolic and diastolic heart failure (H[facility])   Pleural effusion   Stenosis of right carotid artery   Patient Active Problem List   Diagnosis Date Noted  . Stenosis of right carotid artery   . Pleural effusion 10/26/2019  . Acute combined systolic and diastolic heart failure ([place]) 10/25/2019  . Acute congestive heart failure ([place])   . Superficial bacterial skin infection 06/16/2018  . Bacterial skin infection 06/16/2018  . CAD (coronary artery disease) 08/29/2011  . Tobacco abuse 08/29/2011  . Hypertension 08/29/2011  . Hyperlipidemia 08/29/2011  . Diabetes mellitus ([place]) 08/29/2011   History of Present Illness:    at time of consultation  59 year old gentleman with a past medical history of hypertension type 2 diabetes sleep apnea tobacco abuse and known coronary artery disease presented with worsening shortness of breath and fatigue in addition to peripheral edema. The patient has had prior PCI and known reduced LV function. He was well compensated until recently when he developed the symptoms noted above. With the symptoms he presented to the emergency department where he was found to be in heart failure. The patient was admitted has been diuresed. He is feeling symptomatically better and underwent left heart catheterization today demonstrating multiple vessel coronary artery disease. He is referred for CABG.   Discharged Condition: good  Hospital Course: Patient was admitted to the internal medicine teaching service additionally, a cardiology consultation was obtained.  He  was medically stabilized with diuresis.  An echocardiogram was obtained and left ventricular ejection fraction was noted to be 30 to 35%.  Please see the full report below.  He was also felt to require cardiac catheterization which was done 10/28/2019.  Please see the full report below.  Due to the findings cardiothoracic surgical consultation was obtained with Dr. Orvan Seen who evaluated the patient and his studies and recommended proceeding with coronary artery surgical revascularization as his best option due to the severity of the findings.  Patient was taken to the operating room on 10/29/2019 where he underwent CABG x3.  He tolerated it well was taken to the surgical intensive care unit in stable condition.  Postoperative hospital course:  The patient has been extubated without difficulty using standard protocols.  He was placed on epinephrine and milrinone early postoperatively and this has been gradually weaned over time.  We were assisted during the postoperative period with management of his heart failure by the advanced heart failure team.  Milrinone was weaned over time.  He has had a significant postoperative volume overload but has responded well to diuretics.  Additionally he has had postoperative atrial fibrillation and has been placed on amiodarone.  His blood sugars have been elevated during the postoperative period with gradual improvement over time with usual protocols and gradual resumption of his home diabetic medication regimen.    Consults: cardiology  Significant Diagnostic Studies: angiography: cardiac cath, echocardiogram  Treatments:     10/24/2019 - 10/29/2019  6:46 PM  PATIENT:  [name]  59 y.o. male  PRE-OPERATIVE DIAGNOSIS:  congestive heart failure  POST-OPERATIVE DIAGNOSIS:  congestive heart failure  PROCEDURE:  Procedure(s):  CORONARY ARTERY BYPASS GRAFTING x 3 -LIMA to LAD -RIMA to PDA -RADIAL ARTERY TO LEFT CIRCUMFLEX  RADIAL ARTERY HARVEST    -Open radial harvest  TRANSESOPHAGEAL ECHOCARDIOGRAM (TEE) (N/A)  SURGEON:  Surgeon(s) and Role:    * Wonda Olds, MD - Primary  PHYSICIAN ASSISTANT: Ellwood Handler PA-C  ANESTHESIA:   general  BLOOD ADMINISTERED: CELLSAVER  DRAINS: Left and Right Pleural Chest Tubes, Mediastinal Chest Drains   LOCAL MEDICATIONS USED:  BUPIVICAINE   SPECIMEN:  No Specimen  DISPOSITION OF SPECIMEN:  N/A  COUNTS:  YES  TOURNIQUET:  * No tourniquets in log *  DICTATION: .Dragon Dictation  PLAN OF CARE: Admit to inpatient   PATIENT DISPOSITION:  ICU - intubated and hemodynamically stable.   Delay start of Pharmacological VTE agent (>24hrs) due to surgical blood loss or risk of bleeding: yes    Discharge Exam: Blood pressure 119/86, pulse 86, temperature 98 F (36.7 C), temperature source Oral, resp. rate 18, height 5\' 7"  (1.702 m), weight 81.8 kg, SpO2 95 %.   General appearance: alert, cooperative and no distress Heart: irregularly irregular rhythm Lungs: minor basilar crackles Abdomen: benign Extremities: + LE edema Wound: incis healing well   Disposition: Discharge disposition: 01-Home or Self Care       Discharge Instructions    Amb Referral to Nutrition and Diabetic E   Complete by: As directed      Allergies as of 11/06/2019      Reactions   Codeine Shortness Of Breath, Itching   Gabapentin Other (See Comments)   Suicidal thoughts, extreme dreams   Pregabalin    Suicidal thoughts,extreme dreams    Simvastatin    Pain in joints   Hydrocodone Nausea Only, Rash   Breathing problems    Lisinopril Itching, Rash   Venlafaxine Rash      Medication List    STOP taking these medications   aspirin EC 81 MG tablet   carvedilol 25 MG tablet Commonly known as: COREG   hydrOXYzine 25 MG tablet Commonly known as: ATARAX/VISTARIL   ibuprofen 200 MG tablet Commonly known as: ADVIL   rosuvastatin 10 MG tablet Commonly known as: CRESTOR      TAKE these medications   acetaminophen 500 MG tablet Commonly known as: TYLENOL Take 1,000 mg by mouth every 6 (six) hours as needed for headache.   amiodarone 400 MG tablet Commonly known as: PACERONE Take 400mg  (2 tabs) twice a day for 2 weeks, then take 200mg  (1 tab) twice a day until we see you in clinic.   apixaban 5 MG Tabs tablet Commonly known as: ELIQUIS Take 1 tablet (5 mg total) by mouth 2 (two) times daily.   atorvastatin 40 MG tablet Commonly known as: LIPITOR Take 1 tablet (40 mg total) by mouth daily. Start taking on: November 07, 2019   Centrum Silver tablet Take 1 tablet by mouth daily.   clopidogrel 75 MG tablet Commonly known as: PLAVIX Take 1 tablet (75 mg total) by mouth daily. Start taking on: November 07, 2019   eucerin cream Apply topically daily. What changed:   how much to take  when to take this  reasons to take this   furosemide 40 MG tablet Commonly known as: Lasix Take 1 tablet (40 mg total) by mouth daily.   HYDROmorphone 2 MG tablet Commonly known as: DILAUDID Take 0.5-1 tablets (1-2 mg total) by mouth every 6 (six) hours as needed for severe pain.   Jardiance 25 MG Tabs tablet  Generic drug: empagliflozin Take 25 mg by mouth daily.   metFORMIN 1000 MG tablet Commonly known as: GLUCOPHAGE Take 1,000 mg by mouth 2 (two) times daily with a meal.   metoprolol tartrate 25 MG tablet Commonly known as: LOPRESSOR Take 1 tablet (25 mg total) by mouth 2 (two) times daily.   triamcinolone cream 0.1 % Commonly known as: KENALOG Apply 1 application topically 2 (two) times daily. What changed:   when to take this  reasons to take this   Trulicity 8.09 XI/3.3AS Sopn Generic drug: Dulaglutide Inject 0.75 mg into the skin once a week.            Durable Medical Equipment  (From admission, onward)         Start     Ordered   11/06/19 0727  For home use only DME oxygen  Once       Question Answer Comment  Length of Need 6 Months    Mode or (Route) Nasal cannula   Liters per Minute 2   Oxygen delivery system Gas      11/06/19 0727            ECHOCARDIOGRAM REPORT       Patient Name:  [name] Date of Exam: 10/25/2019  Medical Rec #: [id number]     Height:    67.0 in  Accession #:  [id number]    Weight:    218.1 lb  Date of Birth: Mar 23, 1961     BSA:     2.098 m  Patient Age:  41 years     BP:      169/93 mmHg  Patient Gender: M         HR:      56 bpm.  Exam Location: Inpatient   Procedure: 2D Echo, Cardiac Doppler and Color Doppler   Indications:  CHF-Acute Systolic 973.53 / G99.24    History:    Patient has prior history of Echocardiogram examinations,  most         recent 09/18/2011. CAD; Risk Factors:Hypertension, Diabetes  and         Dyslipidemia. Tobacco abuse,Heart attack ([place]) (From Hx).    Sonographer:  Alvino Chapel RCS  Referring Phys: [place]    1. Left ventricular ejection fraction, by estimation, is 30 to 35%. The  left ventricle has moderately decreased function. The left ventricle  demonstrates regional wall motion abnormalities (see scoring  diagram/findings for description). There is  moderate left ventricular hypertrophy. Left ventricular diastolic  parameters are consistent with Grade III diastolic dysfunction  (restrictive). Suggest Definity contrast study to more effectively exclude  apical thrombus - acoustic shadowing and false  tendon noted.  2. Right ventricular systolic function is mildly reduced. The right  ventricular size is normal. There is moderately elevated pulmonary artery  systolic pressure. The estimated right ventricular systolic pressure is  26.8 mmHg.  3. Left atrial size was mild to moderately dilated.  4. Right atrial size was mild to moderately dilated.  5. The mitral valve is abnormal, mildly thickened and with annular   calcification. Mild mitral valve regurgitation.  6. The tricuspid valve is abnormal.  7. The aortic valve is tricuspid. Aortic valve regurgitation is trivial.  8. The inferior vena cava is dilated in size with <50% respiratory  variability, suggesting right atrial pressure of 15 mmHg.   FINDINGS  Left Ventricle: Left ventricular ejection fraction, by estimation, is 30  to 35%. The  left ventricle has moderately decreased function. The left  ventricle demonstrates regional wall motion abnormalities. The left  ventricular internal cavity size was  normal in size. There is moderate left ventricular hypertrophy. Left  ventricular diastolic parameters are consistent with Grade III diastolic  dysfunction (restrictive).     LV Wall Scoring:  The mid and distal anterior wall and entire apex are akinetic. The  inferior  wall, mid anterolateral segment, and basal anterior segment are  hypokinetic.  The basal anterolateral segment, mid inferoseptal segment, and basal  inferoseptal segment are normal.   Right Ventricle: The right ventricular size is normal. No increase in  right ventricular wall thickness. Right ventricular systolic function is  mildly reduced. There is moderately elevated pulmonary artery systolic  pressure. The tricuspid regurgitant  velocity is 2.90 m/s, and with an assumed right atrial pressure of 15  mmHg, the estimated right ventricular systolic pressure is 41.9 mmHg.   Left Atrium: Left atrial size was mild to moderately dilated.   Right Atrium: Right atrial size was mild to moderately dilated.   Pericardium: Trivial pericardial effusion is present. The pericardial  effusion is posterior to the left ventricle.   Mitral Valve: The mitral valve is abnormal. There is mild thickening of  the mitral valve leaflet(s). Mild mitral annular calcification. Mild  mitral valve regurgitation.   Tricuspid Valve: The tricuspid valve is abnormal. Tricuspid valve   regurgitation is mild.   Aortic Valve: The aortic valve is tricuspid. Aortic valve regurgitation is  trivial. Mild aortic valve annular calcification.   Pulmonic Valve: The pulmonic valve was grossly normal. Pulmonic valve  regurgitation is trivial.   Aorta: The aortic root is normal in size and structure.   Venous: The inferior vena cava is dilated in size with less than 50%  respiratory variability, suggesting right atrial pressure of 15 mmHg.   IAS/Shunts: No atrial level shunt detected by color flow Doppler.     LEFT VENTRICLE  PLAX 2D  LVIDd:     5.17 cm   Diastology  LVIDs:     4.10 cm   LV e' lateral:  3.37 cm/s  LV PW:     1.50 cm   LV E/e' lateral: 28.3  LV IVS:    1.60 cm   LV e' medial:  4.24 cm/s  LVOT diam:   1.70 cm   LV E/e' medial: 22.5  LV SV:     48  LV SV Index:  23  LVOT Area:   2.27 cm    LV Volumes (MOD)  LV vol d, MOD A2C: 117.0 ml  LV vol d, MOD A4C: 121.0 ml  LV vol s, MOD A2C: 71.3 ml  LV vol s, MOD A4C: 68.8 ml  LV SV MOD A2C:   45.7 ml  LV SV MOD A4C:   121.0 ml  LV SV MOD BP:   51.5 ml   RIGHT VENTRICLE  RV S prime:   8.81 cm/s  TAPSE (M-mode): 1.5 cm   LEFT ATRIUM       Index    RIGHT ATRIUM      Index  LA diam:    4.10 cm 1.95 cm/m RA Area:   27.70 cm  LA Vol (A2C):  84.9 ml 40.47 ml/m RA Volume:  101.00 ml 48.14 ml/m  LA Vol (A4C):  81.3 ml 38.75 ml/m  LA Biplane Vol: 87.2 ml 41.56 ml/m  AORTIC VALVE  LVOT Vmax:  99.20 cm/s  LVOT Vmean: 63.800 cm/s  LVOT  VTI:  0.212 m    AORTA  Ao Root diam: 3.50 cm   MITRAL VALVE        TRICUSPID VALVE  MV Area (PHT): 4.21 cm  TR Peak grad:  33.6 mmHg  MV Decel Time: 180 msec  TR Vmax:    290.00 cm/s  MV E velocity: 95.50 cm/s  MV A velocity: 23.10 cm/s SHUNTS  MV E/A ratio: 4.13    Systemic VTI: 0.21 m               Systemic Diam: 1.70 cm   Rozann Lesches MD   Electronically signed by Rozann Lesches MD  Signature Date/Time: 10/25/2019/4:49:11 PM      Final    Procedures  RIGHT/LEFT HEART CATH AND CORONARY ANGIOGRAPHY  Conclusion    Prox RCA lesion is 100% stenosed.  2nd Mrg lesion is 80% stenosed.  Prox Cx to Mid Cx lesion is 80% stenosed.  Ramus lesion is 70% stenosed.  2nd Diag lesion is 90% stenosed.  Mid LAD lesion is 99% stenosed.   1. Severe triple vessel CAD 2. Severe stenosis mid LAD 3. Severe stenosis moderate caliber Diagonal branch 4. Severe stenosis mid Circumflex into the stented segment of the OM branch 5. Severe stenosis moderate caliber intermediate branch 6. Chronic occlusion proximal RCA. The distal vessel fills from right to right and left to right collaterals.  7. Elevated filling pressures.   Will resume IV lasix. Will consult CT surgery for CABG.   Recommendations  Antiplatelet/Anticoag Will consult CT surgery for CABG.  Surgeon Notes    10/29/2019 8:21 PM Brief Op Note signed by Wonda Olds, MD    10/29/2019 8:14 PM Operative Note - Scan signed by Default, Provider, MD  Indications  Acute systolic CHF (congestive heart failure), NYHA class 3 ([place]) [I50.21 (ICD-10-CM)]  Unstable angina ([place]) [I20.0 (ICD-10-CM)]  Procedural Details  Technical Details Indication: 59 yo male with history of CAD with prior PTCA of the Diagonal and stenting of the Circumflex who is admitted with acute systolic CHF and found to have a new cardiomyopathy.   Procedure: The risks, benefits, complications, treatment options, and expected outcomes were discussed with the patient. The patient and/or family concurred with the proposed plan, giving informed consent. The patient was brought to the cath lab after IV hydration was given. The patient was further with Versed and Fentanyl. The IV catheter present in the right antecubital vein was changed for a 5 [place] sheath. Right heart catheterization performed with a  balloon tipped catheter. The right wrist was prepped and draped in a sterile fashion. 1% lidocaine was used for local anesthesia. Using the modified Seldinger access technique, a 5 French sheath was placed in the right radial artery. 3 mg Verapamil was given through the sheath. I was unable to pass the catheter beyond the elbow due to a radial artery loop.   The right groin was prepped and draped. U/s guidance used to place a 5 French sheath in the right femoral artery. Standard diagnostic catheters were used to perform selective coronary angiography. LV pressures measured with the JR4 catheter. The sheath was removed from the right radial artery and a Terumo hemostasis band was applied at the arteriotomy site on the right wrist. Mynx closure device placed in the right femoral artery.   Estimated blood loss <50 mL.   During this procedure medications were administered to achieve and maintain moderate conscious sedation while the patient's heart rate, blood pressure, and oxygen saturation were continuously  monitored and I was present face-to-face 100% of this time.  Medications (Filter: Administrations occurring from 1554 to 1717 on 10/28/19) (important) Continuous medications are totaled by the amount administered until 10/28/19 1717.  midazolam (VERSED) injection (mg) Total dose:  3 mg  Date/Time  Rate/Dose/Volume Action  10/28/19 1611  2 mg Given  1640  1 mg Given    fentaNYL (SUBLIMAZE) injection (mcg) Total dose:  75 mcg  Date/Time  Rate/Dose/Volume Action  10/28/19 1612  50 mcg Given  1640  25 mcg Given    Heparin (Porcine) in NaCl 1000-0.9 UT/500ML-% SOLN (mL) Total volume:  1,000 mL  Date/Time  Rate/Dose/Volume Action  10/28/19 1612  500 mL Given  1612  500 mL Given    lidocaine (PF) (XYLOCAINE) 1 % injection (mL) Total volume:  14 mL  Date/Time  Rate/Dose/Volume Action  10/28/19 1625  2 mL Given  1640  12 mL Given    Radial Cocktail/Verapamil only (mL) Total volume:   10 mL  Date/Time  Rate/Dose/Volume Action  10/28/19 1626  10 mL Given    iohexol (OMNIPAQUE) 350 MG/ML injection (mL) Total volume:  50 mL  Date/Time  Rate/Dose/Volume Action  10/28/19 1704  50 mL Given    atorvastatin (LIPITOR) tablet 40 mg (mg) Total dose:  Cannot be calculated* Dosing weight:  88.7  *Administration dose not documented Date/Time  Rate/Dose/Volume Action  10/28/19 1554  *Not included in total MAR Hold    ipratropium-albuterol (DUONEB) 0.5-2.5 (3) MG/3ML nebulizer solution 3 mL (mL) Total dose:  Cannot be calculated* Dosing weight:  96.6  *Administration dose not documented Date/Time  Rate/Dose/Volume Action  10/28/19 1554  *Not included in total MAR Hold    0.9 % sodium chloride infusion (mL) Total dose:  Cannot be calculated* Dosing weight:  96.6  *Administration dose not documented Date/Time  Rate/Dose/Volume Action  10/28/19 1554  *Not included in total MAR Hold    acetaminophen (TYLENOL) tablet 650 mg (mg) Total dose:  Cannot be calculated* Dosing weight:  96.6  *Administration dose not documented Date/Time  Rate/Dose/Volume Action  10/28/19 1554  *Not included in total MAR Hold    aspirin EC tablet 81 mg (mg) Total dose:  Cannot be calculated*  *Administration dose not documented Date/Time  Rate/Dose/Volume Action  10/28/19 1554  *Not included in total MAR Hold    carvedilol (COREG) tablet 3.125 mg (mg) Total dose:  Cannot be calculated* Dosing weight:  88.7  *Administration dose not documented Date/Time  Rate/Dose/Volume Action  10/28/19 1554  *Not included in total MAR Hold  1630  *Not included in total Automatically Held    empagliflozin (JARDIANCE) tablet 25 mg (mg) Total dose:  Cannot be calculated* Dosing weight:  96.6  *Administration dose not documented Date/Time  Rate/Dose/Volume Action  10/28/19 1554  *Not included in total MAR Hold    furosemide (LASIX) tablet 80 mg (mg) Total dose:  Cannot be calculated* Dosing weight:   88.7  *Administration dose not documented Date/Time  Rate/Dose/Volume Action  10/28/19 1554  *Not included in total MAR Hold    insulin aspart (novoLOG) injection 0-20 Units (Units) Total dose:  Cannot be calculated* Dosing weight:  96.6  *Administration dose not documented Date/Time  Rate/Dose/Volume Action  10/28/19 1554  *Not included in total MAR Hold  1700  *Not included in total Automatically Held    insulin glargine (LANTUS) injection 10 Units (Units) Total dose:  Cannot be calculated* Dosing weight:  92.8  *Administration dose not documented Date/Time  Rate/Dose/Volume Action  10/28/19 1554  *Not included in total MAR Hold    lidocaine (LIDODERM) 5 % 2 patch (patch) Total dose:  Cannot be calculated* Dosing weight:  96.6  *Administration dose not documented Date/Time  Rate/Dose/Volume Action  10/28/19 1554  *Not included in total MAR Hold    nicotine (NICODERM CQ - dosed in mg/24 hours) patch 21 mg (mg) Total dose:  Cannot be calculated* Dosing weight:  96.6  *Administration dose not documented Date/Time  Rate/Dose/Volume Action  10/28/19 1554  *Not included in total MAR Hold    ondansetron (ZOFRAN) injection 4 mg (mg) Total dose:  Cannot be calculated* Dosing weight:  96.6  *Administration dose not documented Date/Time  Rate/Dose/Volume Action  10/28/19 1554  *Not included in total MAR Hold    sacubitril-valsartan (ENTRESTO) 49-51 mg per tablet (tablet) Total dose:  Cannot be calculated* Dosing weight:  92.8  *Administration dose not documented Date/Time  Rate/Dose/Volume Action  10/28/19 1554  *Not included in total MAR Hold    sodium chloride flush (NS) 0.9 % injection 3 mL (mL) Total dose:  Cannot be calculated* Dosing weight:  96.6  *Administration dose not documented Date/Time  Rate/Dose/Volume Action  10/28/19 1554  *Not included in total MAR Hold    sodium chloride flush (NS) 0.9 % injection 3 mL (mL) Total dose:  Cannot be calculated* Dosing  weight:  96.6  *Administration dose not documented Date/Time  Rate/Dose/Volume Action  10/28/19 1554  *Not included in total MAR Hold    sodium chloride flush (NS) 0.9 % injection 3 mL (mL) Total dose:  Cannot be calculated* Dosing weight:  88.7  *Administration dose not documented Date/Time  Rate/Dose/Volume Action  10/28/19 1554  *Not included in total MAR Hold    spironolactone (ALDACTONE) tablet 12.5 mg (mg) Total dose:  Cannot be calculated* Dosing weight:  85  *Administration dose not documented Date/Time  Rate/Dose/Volume Action  10/28/19 1554  *Not included in total MAR Hold    Sedation Time  Sedation Time Physician-1: 49 minutes 49 seconds  Contrast  Medication Name Total Dose  iohexol (OMNIPAQUE) 350 MG/ML injection 50 mL    Radiation/Fluoro  Fluoro time: 6 (min) DAP: 24325 (mGycm2) Cumulative Air Kerma: 585 (mGy)  Complications  Complications documented before study signed (10/28/2019 5:21 PM)   RIGHT/LEFT HEART CATH AND CORONARY ANGIOGRAPHY  None Documented by Burnell Blanks, MD 10/28/2019 5:18 PM  Date Found: 10/28/2019  Time Range: Intraprocedure      Coronary Findings  Diagnostic Dominance: Right Left Anterior Descending  Vessel is large.  Mid LAD lesion is 99% stenosed.  Second Diagonal Branch  Vessel is moderate in size.  2nd Diag lesion is 90% stenosed.  Ramus Intermedius  Vessel is moderate in size.  Ramus lesion is 70% stenosed.  Left Circumflex  Vessel is large.  Prox Cx to Mid Cx lesion is 80% stenosed.  Second Obtuse Marginal Branch  Vessel is large in size.  2nd Mrg lesion is 80% stenosed. The lesion was previously treated using a stent (unknown type) over 2 years ago.  Right Coronary Artery  Vessel is large.  Prox RCA lesion is 100% stenosed. The lesion is chronically occluded.  Right Posterior Descending Artery  Right Posterior Atrioventricular Artery  Third Right Posterolateral Branch  Collaterals  3rd RPL filled by  collaterals from 2nd Sept.    Intervention  No interventions have been documented. Coronary Diagrams  Diagnostic Dominance: Right  Intervention      Follow-up Information    Fredrich Romans  Z, MD Follow up.   Specialty: Cardiothoracic Surgery Why: Please see discharge paperwork for follow-up appointment with the surgeon. Contact information: [address] [place] [address] [phone]        Erlene Quan, PA-C Follow up.   Specialties: Cardiology, Radiology Why: Hospital follow-up scheduled for 11/23/2019 at 2:15pm for [facility], one of Dr. Jacalyn Lefevre PAs. If this date/time does not work for you, please call our office to reschedule.  Contact information: [address] [place] [address] [phone]        Concepcion Elk, MD. Call in 1 day(s).   Specialty: Internal Medicine Contact information: [address] [place] [address] [phone]        Lelon Perla, MD .   Specialty: Cardiology Contact information: [address] [place] [address] [phone]              The patient has been discharged on:   1.Beta Blocker:  Yes [ yes ]                              No   [   ]                              If No, reason:  2.Ace Inhibitor/ARB: Yes [   ]                                     No  [ no   ]                                     If No, reason: BP well controlled  3.Statin:   Yes [ yes  ]                  No  [   ]                  If No, reason:  4.Ecasa:  Yes  [ no  ]                  No   [   ]                  If No, reason: on plavix & Eliquis  Signed: Elgie Collard  11/06/2019, 1:57 PM

## 2019-10-30 NOTE — Progress Notes (Signed)
EVENING ROUNDS NOTE :     [address]       [place] [address]             [phone]                 1 Day Post-Op Procedure(s) (LRB): CORONARY ARTERY BYPASS GRAFTING (CABG) using LIMA to LAD; RIMA to PDA; and Left radial harvest vein to Circ. (N/A) RADIAL ARTERY HARVEST (Left) TRANSESOPHAGEAL ECHOCARDIOGRAM (TEE) (N/A)   Total Length of Stay:  LOS: 5 days  Events:   No events today. Resting comfortably in bed     BP 120/66   Pulse 86   Temp 97.7 F (36.5 C)   Resp 12   Ht 5\' 7"  (1.702 m)   Wt 85.4 kg   SpO2 99%   BMI 29.49 kg/m   PAP: (40-66)/(13-31) 50/19 CVP:  [0 mmHg-21 mmHg] 12 mmHg CO:  [2.6 L/min-5.9 L/min] 4.6 L/min CI:  [1.2 L/min/m2-3 L/min/m2] 2.4 L/min/m2  Vent Mode: BIPAP FiO2 (%):  [40 %-100 %] 40 % Set Rate:  [4 bmp-20 bmp] 12 bmp Vt Set:  [520 mL] 520 mL PEEP:  [5 cmH20-8 cmH20] 5 cmH20 Pressure Support:  [10 cmH20] 10 cmH20 Plateau Pressure:  [15 cmH20-19 cmH20] 19 cmH20  . sodium chloride 10 mL/hr at 10/30/19 1800  . sodium chloride    . sodium chloride 20 mL/hr at 10/29/19 2134  . acetaminophen Stopped (10/30/19 1448)  . cefUROXime (ZINACEF)  IV Stopped (10/30/19 1135)  . epinephrine 3 mcg/min (10/30/19 1800)  . insulin 3.6 mL/hr at 10/30/19 1800  . lactated ringers    . lactated ringers    . lactated ringers Stopped (10/30/19 0601)  . milrinone 0.375 mcg/kg/min (10/30/19 1800)  . nitroGLYCERIN Stopped (10/30/19 1115)  . norepinephrine (LEVOPHED) Adult infusion    . phenylephrine (NEO-SYNEPHRINE) Adult infusion      I/O last 3 completed shifts: In: 6266.9 [P.O.:240; I.V.:3185; Blood:1140; NG/GT:100; IV Piggyback:1601.9] Out: 8210 [OZDGU:4403; Blood:1200; Chest Tube:840]   CBC Latest Ref Rng & Units 10/30/2019 10/30/2019 10/30/2019  WBC 4.0 - 10.5 K/uL 16.3(H) - -  Hemoglobin 13.0 - 17.0 g/dL 13.8 16.3 16.3  Hematocrit 39 - 52 % 44.0 48.0 48.0  Platelets 150 - 400 K/uL 156 - -    BMP Latest Ref Rng & Units  10/30/2019 10/30/2019 10/30/2019  Glucose 70 - 99 mg/dL 140(H) - -  BUN 6 - 20 mg/dL 16 - -  Creatinine 0.61 - 1.24 mg/dL 0.90 - -  Sodium 135 - 145 mmol/L 141 145 144  Potassium 3.5 - 5.1 mmol/L 4.0 4.5 4.4  Chloride 98 - 111 mmol/L 110 - -  CO2 22 - 32 mmol/L 24 - -  Calcium 8.9 - 10.3 mg/dL 8.0(L) - -    ABG    Component Value Date/Time   PHART 7.271 (L) 10/30/2019 0506   PCO2ART 48.5 (H) 10/30/2019 0506   PO2ART 75 (L) 10/30/2019 0506   HCO3 22.2 10/30/2019 0506   TCO2 24 10/30/2019 0506   ACIDBASEDEF 5.0 (H) 10/30/2019 0506   O2SAT 67.7 10/30/2019 0512       Melodie Bouillon, MD 10/30/2019 6:33 PM

## 2019-10-30 NOTE — Anesthesia Postprocedure Evaluation (Signed)
Anesthesia Post Note  Patient: [name]  Procedure(s) Performed: CORONARY ARTERY BYPASS GRAFTING (CABG) using LIMA to LAD; RIMA to PDA; and Left radial harvest vein to Circ. (N/A Chest) RADIAL ARTERY HARVEST (Left Arm Lower) TRANSESOPHAGEAL ECHOCARDIOGRAM (TEE) (N/A )     Patient location during evaluation: SICU Anesthesia Type: General Level of consciousness: patient remains intubated per anesthesia plan Pain management: pain level controlled Vital Signs Assessment: post-procedure vital signs reviewed and stable Respiratory status: patient remains intubated per anesthesia plan Cardiovascular status: stable Postop Assessment: no apparent nausea or vomiting Anesthetic complications: no   No complications documented.  Last Vitals:  Vitals:   10/29/19 2330 10/30/19 0000  BP:    Pulse: 86 86  Resp: 16 16  Temp: 37.1 C 37.3 C  SpO2: 100% 97%    Last Pain:  Vitals:   10/30/19 0000  TempSrc: Core  PainSc:                  Tyshell Ramberg

## 2019-10-30 NOTE — Progress Notes (Signed)
1 Day Post-Op Procedure(s) (LRB): CORONARY ARTERY BYPASS GRAFTING (CABG) using LIMA to LAD; RIMA to PDA; and Left radial harvest vein to Circ. (N/A) RADIAL ARTERY HARVEST (Left) TRANSESOPHAGEAL ECHOCARDIOGRAM (TEE) (N/A) Subjective: I don't like this mask Objective: Vital signs in last 24 hours: Temp:  [97.9 F (36.6 C)-99.7 F (37.6 C)] 99 F (37.2 C) (07/09 0739) Pulse Rate:  [63-86] 85 (07/09 0739) Cardiac Rhythm: Atrial paced (07/09 0400) Resp:  [0-24] 12 (07/09 0739) BP: (102-165)/(64-92) 104/67 (07/09 0739) SpO2:  [89 %-100 %] 100 % (07/09 0739) Arterial Line BP: (69-140)/(52-94) 69/56 (07/09 0739) FiO2 (%):  [40 %-100 %] 50 % (07/09 0546) Weight:  [81.9 kg-85.4 kg] 85.4 kg (07/09 0500)  Hemodynamic parameters for last 24 hours: PAP: (40-66)/(13-29) 45/18 CVP:  [0 mmHg-21 mmHg] 12 mmHg CO:  [2.6 L/min-5.9 L/min] 4.8 L/min CI:  [1.2 L/min/m2-3 L/min/m2] 2.5 L/min/m2  Intake/Output from previous day: 07/08 0701 - 07/09 0700 In: 5635.1 [I.V.:2793.2; Blood:1140; NG/GT:100; IV Piggyback:1601.9] Out: 9937 [Urine:2245; Blood:1200; Chest Tube:840] Intake/Output this shift: No intake/output data recorded.  General appearance: alert and cooperative Neurologic: intact Heart: regular rate and rhythm, S1, S2 normal, no murmur, click, rub or gallop Lungs: clear to auscultation bilaterally Abdomen: soft, non-tender; bowel sounds normal; no masses,  no organomegaly Extremities: extremities normal, atraumatic, no cyanosis or edema Wound: c/d/i  Lab Results: Recent Labs    10/29/19 2033 10/29/19 2034 10/30/19 0254 10/30/19 0254 10/30/19 0308 10/30/19 0506  WBC 14.7*  --  16.4*  --   --   --   HGB 17.7*   < > 16.0   < > 16.3 16.3  HCT 55.4*   < > 50.6   < > 48.0 48.0  PLT 180  --  190  --   --   --    < > = values in this interval not displayed.   BMET:  Recent Labs    10/29/19 0508 10/29/19 1528 10/29/19 1919 10/29/19 2034 10/30/19 0254 10/30/19 0254  10/30/19 0308 10/30/19 0506  NA 140   < > 144   < > 143   < > 144 145  K 3.8   < > 3.9   < > 4.7   < > 4.4 4.5  CL 106   < > 106  --  114*  --   --   --   CO2 23  --   --   --  20*  --   --   --   GLUCOSE 256*   < > 104*  --  138*  --   --   --   BUN 21*   < > 19  --  17  --   --   --   CREATININE 1.08   < > 0.70  --  1.02  --   --   --   CALCIUM 8.9  --   --   --  8.3*  --   --   --    < > = values in this interval not displayed.    PT/INR:  Recent Labs    10/29/19 2306  LABPROT 17.7*  INR 1.5*   ABG    Component Value Date/Time   PHART 7.271 (L) 10/30/2019 0506   HCO3 22.2 10/30/2019 0506   TCO2 24 10/30/2019 0506   ACIDBASEDEF 5.0 (H) 10/30/2019 0506   O2SAT 67.7 10/30/2019 0512   CBG (last 3)  Recent Labs    10/30/19 0308 10/30/19 1696 10/30/19 0505  GLUCAP 147* 108* 187*    Assessment/Plan: S/P Procedure(s) (LRB): CORONARY ARTERY BYPASS GRAFTING (CABG) using LIMA to LAD; RIMA to PDA; and Left radial harvest vein to Circ. (N/A) RADIAL ARTERY HARVEST (Left) TRANSESOPHAGEAL ECHOCARDIOGRAM (TEE) (N/A) Mobilize gently resuscitate  Wean epi as tolerated Consult advanced heart failure   LOS: 5 days    Wonda Olds 10/30/2019

## 2019-10-31 ENCOUNTER — Inpatient Hospital Stay (HOSPITAL_COMMUNITY): Payer: Medicaid Other

## 2019-10-31 DIAGNOSIS — I252 Old myocardial infarction: Secondary | ICD-10-CM

## 2019-10-31 DIAGNOSIS — I255 Ischemic cardiomyopathy: Secondary | ICD-10-CM

## 2019-10-31 LAB — BASIC METABOLIC PANEL
Anion gap: 10 (ref 5–15)
BUN: 13 mg/dL (ref 6–20)
CO2: 21 mmol/L — ABNORMAL LOW (ref 22–32)
Calcium: 8.1 mg/dL — ABNORMAL LOW (ref 8.9–10.3)
Chloride: 108 mmol/L (ref 98–111)
Creatinine, Ser: 0.93 mg/dL (ref 0.61–1.24)
GFR calc Af Amer: >60 mL/min (ref >60)
GFR calc non Af Amer: >60 mL/min (ref >60)
Glucose, Bld: 144 mg/dL — ABNORMAL HIGH (ref 70–99)
Potassium: 4.4 mmol/L (ref 3.5–5.1)
Sodium: 139 mmol/L (ref 135–145)

## 2019-10-31 LAB — CBC
HCT: 46.6 % (ref 39.0–52.0)
Hemoglobin: 14.4 g/dL (ref 13.0–17.0)
MCH: 28.4 pg (ref 26.0–34.0)
MCHC: 30.9 g/dL (ref 30.0–36.0)
MCV: 91.9 fL (ref 80.0–100.0)
Platelets: 129 10*3/uL — ABNORMAL LOW (ref 150–400)
RBC: 5.07 MIL/uL (ref 4.22–5.81)
RDW: 15.4 % (ref 11.5–15.5)
WBC: 16.1 10*3/uL — ABNORMAL HIGH (ref 4.0–10.5)
nRBC: 0 % (ref 0.0–0.2)

## 2019-10-31 LAB — COOXEMETRY PANEL
Carboxyhemoglobin: 2.3 % — ABNORMAL HIGH (ref 0.5–1.5)
Methemoglobin: 1.1 % (ref 0.0–1.5)
O2 Saturation: 72.5 %
Total hemoglobin: 14.8 g/dL (ref 12.0–16.0)

## 2019-10-31 LAB — GLUCOSE, CAPILLARY
Glucose-Capillary: 144 mg/dL — ABNORMAL HIGH (ref 70–99)
Glucose-Capillary: 106 mg/dL — ABNORMAL HIGH (ref 70–99)
Glucose-Capillary: 134 mg/dL — ABNORMAL HIGH (ref 70–99)
Glucose-Capillary: 190 mg/dL — ABNORMAL HIGH (ref 70–99)
Glucose-Capillary: 167 mg/dL — ABNORMAL HIGH (ref 70–99)
Glucose-Capillary: 167 mg/dL — ABNORMAL HIGH (ref 70–99)
Glucose-Capillary: 183 mg/dL — ABNORMAL HIGH (ref 70–99)

## 2019-10-31 MED ORDER — INSULIN ASPART 100 UNIT/ML ~~LOC~~ SOLN
0.0000 [IU] | Freq: Three times a day (TID) | SUBCUTANEOUS | Status: DC
  Administered 2019-10-31 – 2019-11-01 (×3): 4 [IU] via SUBCUTANEOUS
  Administered 2019-11-01: 11 [IU] via SUBCUTANEOUS
  Administered 2019-11-01: 13 [IU] via SUBCUTANEOUS
  Administered 2019-11-02 (×3): 11 [IU] via SUBCUTANEOUS
  Administered 2019-11-03: 15 [IU] via SUBCUTANEOUS
  Administered 2019-11-03 (×2): 4 [IU] via SUBCUTANEOUS
  Administered 2019-11-04: 3 [IU] via SUBCUTANEOUS
  Administered 2019-11-04: 7 [IU] via SUBCUTANEOUS
  Administered 2019-11-04: 4 [IU] via SUBCUTANEOUS
  Administered 2019-11-05: 7 [IU] via SUBCUTANEOUS
  Administered 2019-11-05 – 2019-11-06 (×3): 4 [IU] via SUBCUTANEOUS

## 2019-10-31 MED ORDER — FUROSEMIDE 10 MG/ML IJ SOLN
40.0000 mg | Freq: Two times a day (BID) | INTRAMUSCULAR | Status: DC
  Administered 2019-10-31 – 2019-11-02 (×6): 40 mg via INTRAVENOUS
  Filled 2019-10-31 (×6): qty 4

## 2019-10-31 MED ORDER — LEVALBUTEROL HCL 0.63 MG/3ML IN NEBU: 0.6300 mg | INHALATION_SOLUTION | Freq: Four times a day (QID) | RESPIRATORY_TRACT | Status: DC | PRN

## 2019-10-31 MED ORDER — INSULIN ASPART 100 UNIT/ML ~~LOC~~ SOLN
0.0000 [IU] | Freq: Every day | SUBCUTANEOUS | Status: DC
  Administered 2019-11-01 – 2019-11-02 (×2): 3 [IU] via SUBCUTANEOUS

## 2019-10-31 NOTE — Progress Notes (Signed)
      [address]       [place] [address]             [phone]                 2 Days Post-Op Procedure(s) (LRB): CORONARY ARTERY BYPASS GRAFTING (CABG) using LIMA to LAD; RIMA to PDA; and Left radial harvest vein to Circ. (N/A) RADIAL ARTERY HARVEST (Left) TRANSESOPHAGEAL ECHOCARDIOGRAM (TEE) (N/A)   Events: No events Complains of incisional pain _______________________________________________________________ Vitals: BP 118/68   Pulse (!) 102   Temp 97.9 F (36.6 C)   Resp (!) 21   Ht 5\' 7"  (1.702 m)   Wt 88.5 kg   SpO2 91%   BMI 30.56 kg/m   - Neuro: alert NAD  - Cardiovascular: sinus tach  Drips: milr 0.375.   PAP: (42-71)/(13-31) 66/21 CVP:  [5 mmHg-7 mmHg] 5 mmHg CO:  [4 L/min-6.7 L/min] 6.7 L/min CI:  [2.1 L/min/m2-3.5 L/min/m2] 3.5 L/min/m2  - Pulm: EWOB    ABG    Component Value Date/Time   PHART 7.271 (L) 10/30/2019 0506   PCO2ART 48.5 (H) 10/30/2019 0506   PO2ART 75 (L) 10/30/2019 0506   HCO3 22.2 10/30/2019 0506   TCO2 24 10/30/2019 0506   ACIDBASEDEF 5.0 (H) 10/30/2019 0506   O2SAT 67.7 10/30/2019 0512    - Abd: abd soft - Extremity: warm  .Intake/Output      07/09 0701 - 07/10 0700 07/10 0701 - 07/11 0700   P.O. 120    I.V. (mL/kg) 1045.9 (11.8) 38.3 (0.4)   Blood     NG/GT     IV Piggyback 686.8    Total Intake(mL/kg) 1852.7 (20.9) 38.3 (0.4)   Urine (mL/kg/hr) 1900 (0.9) 125 (0.7)   Blood     Chest Tube 555 40   Total Output 2455 165   Net -602.3 -126.7           _______________________________________________________________ Labs: CBC Latest Ref Rng & Units 10/31/2019 10/30/2019 10/30/2019  WBC 4.0 - 10.5 K/uL 16.1(H) 16.3(H) -  Hemoglobin 13.0 - 17.0 g/dL 14.4 13.8 16.3  Hematocrit 39 - 52 % 46.6 44.0 48.0  Platelets 150 - 400 K/uL 129(L) 156 -   CMP Latest Ref Rng & Units 10/31/2019 10/30/2019 10/30/2019  Glucose 70 - 99 mg/dL 144(H) 140(H) -  BUN 6 - 20 mg/dL 13 16 -  Creatinine 0.61 - 1.24 mg/dL 0.93  0.90 -  Sodium 135 - 145 mmol/L 139 141 145  Potassium 3.5 - 5.1 mmol/L 4.4 4.0 4.5  Chloride 98 - 111 mmol/L 108 110 -  CO2 22 - 32 mmol/L 21(L) 24 -  Calcium 8.9 - 10.3 mg/dL 8.1(L) 8.0(L) -  Total Protein 6.5 - 8.1 g/dL - - -  Total Bilirubin 0.3 - 1.2 mg/dL - - -  Alkaline Phos 38 - 126 U/L - - -  AST 15 - 41 U/L - - -  ALT 0 - 44 U/L - - -    CXR: PVC  _______________________________________________________________  Assessment and Plan: POD 2 s/p CABG, doing well  Neuro: pain controlled CV: on milr.  Will wean as needed.  Would like to remove swan, but will defer to cardiology Pulm: continue pulm toilet Renal: good uop, continue diuresis GI: on diet Heme: stable ID: afebrile. WBC stable Endo: SSI Dispo: continue ICU care  Melodie Bouillon, MD 10/31/2019 9:08 AM

## 2019-10-31 NOTE — Progress Notes (Addendum)
Progress Note  Patient Name: [name] Date of Encounter: 10/31/2019  [facility] Cardiologist: Kirk Ruths, MD   Subjective   Improved LE edema, CVP this am 6, he has significant pleuritic chest pain with coughing and deep breathing. Epinephrine drip discontinued at 3 am.   Inpatient Medications    Scheduled Meds: . acetaminophen  1,000 mg Oral Q6H   Or  . acetaminophen (TYLENOL) oral liquid 160 mg/5 mL  1,000 mg Per Tube Q6H  . aspirin EC  81 mg Oral Daily  . atorvastatin  40 mg Oral Daily  . bisacodyl  10 mg Oral Daily   Or  . bisacodyl  10 mg Rectal Daily  . chlorhexidine gluconate (MEDLINE KIT)  15 mL Mouth Rinse BID  . Chlorhexidine Gluconate Cloth  6 each Topical Daily  . clopidogrel  75 mg Oral Daily  . docusate sodium  200 mg Oral Daily  . isosorbide dinitrate  10 mg Oral TID  . mouth rinse  15 mL Mouth Rinse BID  . metoCLOPramide (REGLAN) injection  10 mg Intravenous Q6H  . mupirocin ointment  1 application Nasal BID  . pantoprazole  40 mg Oral Daily  . sodium chloride flush  10-40 mL Intracatheter Q12H  . sodium chloride flush  3 mL Intravenous Q12H   Continuous Infusions: . sodium chloride 10 mL/hr at 10/31/19 0600  . sodium chloride    . sodium chloride 20 mL/hr at 10/29/19 2134  . cefUROXime (ZINACEF)  IV Stopped (10/30/19 2317)  . epinephrine 1 mcg/min (10/31/19 0600)  . insulin Stopped (10/31/19 0233)  . lactated ringers    . lactated ringers    . lactated ringers Stopped (10/30/19 0601)  . milrinone 0.375 mcg/kg/min (10/31/19 0600)  . nitroGLYCERIN Stopped (10/30/19 1115)  . norepinephrine (LEVOPHED) Adult infusion    . phenylephrine (NEO-SYNEPHRINE) Adult infusion     PRN Meds: sodium chloride, dextrose, HYDROmorphone, ipratropium-albuterol, lactated ringers, levalbuterol, metoprolol tartrate, morphine injection, ondansetron (ZOFRAN) IV, sodium chloride flush, sodium chloride flush   Vital Signs    Vitals:   10/31/19 0300  10/31/19 0400 10/31/19 0500 10/31/19 0600  BP: 134/73 125/69  118/68  Pulse: 94 100 (!) 101 (!) 102  Resp: 17 (!) 21 18 (!) 21  Temp: 98.1 F (36.7 C) 97.9 F (36.6 C) 97.9 F (36.6 C) 97.9 F (36.6 C)  TempSrc:      SpO2: 98% 97% 97% 91%  Weight:   88.5 kg   Height:        Intake/Output Summary (Last 24 hours) at 10/31/2019 0748 Last data filed at 10/31/2019 0600 Gross per 24 hour  Intake 1852.7 ml  Output 2455 ml  Net -602.3 ml   Last 3 Weights 10/31/2019 10/30/2019 10/29/2019  Weight (lbs) 195 lb 1.7 oz 188 lb 4.4 oz 180 lb 8.9 oz  Weight (kg) 88.5 kg 85.4 kg 81.9 kg      Telemetry    NSR - Personally Reviewed  ECG    N/A  Physical Exam   GEN: in mild distress sec to pleuritic pain.   Neck: +8 cm JVD, central line in place Cardiac: RRR, no murmurs, rubs, or gallops.  Dressing over thoracotomy scar, chest tubes Respiratory: crackles bilaterally. GI: Soft, nontender, non-distended  MS: 1+ BL LE  edema; No deformity. Neuro:  Nonfocal  Psych: Normal affect   Labs    High Sensitivity Troponin:   Recent Labs  Lab 10/24/19 2011 10/24/19 2218  TROPONINIHS 17 19*  Chemistry Recent Labs  Lab 10/26/19 0142 10/27/19 0428 10/27/19 1700 10/28/19 0845 10/29/19 0508 10/29/19 1528 10/30/19 0254 10/30/19 0308 10/30/19 0506 10/30/19 1627 10/31/19 0524  NA 139   < >  --    < > 140   < > 143   < > 145 141 139  K 3.7   < >  --    < > 3.8   < > 4.7   < > 4.5 4.0 4.4  CL 102   < >  --    < > 106   < > 114*  --   --  110 108  CO2 27   < >  --    < > 23  --  20*  --   --  24 21*  GLUCOSE 225*   < >  --    < > 256*   < > 138*  --   --  140* 144*  BUN 20   < >  --    < > 21*   < > 17  --   --  16 13  CREATININE 1.25*   < >  --    < > 1.08   < > 1.02  --   --  0.90 0.93  CALCIUM 8.6*   < >  --    < > 8.9  --  8.3*  --   --  8.0* 8.1*  PROT 6.1*  --  6.4*  --  6.4*  --   --   --   --   --   --   ALBUMIN 3.2*  --   --   --  3.1*  --   --   --   --   --   --   AST 27   --   --   --  29  --   --   --   --   --   --   ALT 26  --   --   --  29  --   --   --   --   --   --   ALKPHOS 106  --   --   --  120  --   --   --   --   --   --   BILITOT 1.2  --   --   --  1.6*  --   --   --   --   --   --   GFRNONAA >60   < >  --    < > >60  --  >60  --   --  >60 >60  GFRAA >60   < >  --    < > >60  --  >60  --   --  >60 >60  ANIONGAP 10   < >  --    < > 11  --  9  --   --  7 10   < > = values in this interval not displayed.     Hematology Recent Labs  Lab 10/30/19 0254 10/30/19 0308 10/30/19 0506 10/30/19 1627 10/31/19 0524  WBC 16.4*  --   --  16.3* 16.1*  RBC 5.49  --   --  4.86 5.07  HGB 16.0   < > 16.3 13.8 14.4  HCT 50.6   < > 48.0 44.0 46.6  MCV 92.2  --   --  90.5 91.9  MCH 29.1  --   --  28.4 28.4  MCHC 31.6  --   --  31.4 30.9  RDW 15.0  --   --  15.2 15.4  PLT 190  --   --  156 129*   < > = values in this interval not displayed.    BNP Recent Labs  Lab 10/24/19 2012  BNP 1,913.6*     Radiology      Cardiac Studies   Limited Echo 10/26/19 1. No formed LV mural thrombus noted with Definity contrast.   FINDINGS  Left Ventricle: No formed LV mural thrombus noted with Definity contrast.   Echo 10/25/19 1. Left ventricular ejection fraction, by estimation, is 30 to 35%. The  left ventricle has moderately decreased function. The left ventricle  demonstrates regional wall motion abnormalities (see scoring  diagram/findings for description). There is  moderate left ventricular hypertrophy. Left ventricular diastolic  parameters are consistent with Grade III diastolic dysfunction  (restrictive). Suggest Definity contrast study to more effectively exclude  apical thrombus - acoustic shadowing and false  tendon noted.  2. Right ventricular systolic function is mildly reduced. The right  ventricular size is normal. There is moderately elevated pulmonary artery  systolic pressure. The estimated right ventricular systolic pressure is    93.8 mmHg.  3. Left atrial size was mild to moderately dilated.  4. Right atrial size was mild to moderately dilated.  5. The mitral valve is abnormal, mildly thickened and with annular  calcification. Mild mitral valve regurgitation.  6. The tricuspid valve is abnormal.  7. The aortic valve is tricuspid. Aortic valve regurgitation is trivial.  8. The inferior vena cava is dilated in size with <50% respiratory  variability, suggesting right atrial pressure of 15 mmHg.   Patient Profile     [name] is a 59 y.o. male with a hx of CAD, (acute MI 2005 with PTCA to Diag, then stent to LCX in 2007 , last cath 2008 with 20% LM, 50% LAD and 70%mLAD, ? Hx of EF 29% on myoview in 2013 but echo in 2013 with normal EF (60-65%), HTN, DM-2, HLD, + tobacco, chronic pain , PAD  who is being seen  for the evaluation of cardiomyopathy at the request of Dr. Rebeca Alert.  Assessment & Plan    CAD - s/p CABG on 7/8, LIMA to LAD; RIMA to PDA -- Continue ASA and statin  Acute combined CHF - Echo this admission showed LVEF of 30-35% and grade III DD (EF was 60-65% in 2013). No LV thrombus on limited echo.  - Diuresed > 13L and Weight down 26 lbs, post surgery weight up, CVP 8, PAP 65/25 mmHg, and CXR worse pulmonary edema this am, Crea 0.93, I would restart Lasix 40 mg iv BID, monitor CVP, PAP, Crea and weights closely - Heart failure education given. Will get nutritional consult - epinephrine off - continue Milrinone at 0.375  - obtain Coox today, yesterday 92->68 - previously on coreg and entresto, restart once off milrinone and BP improved  AKi - Resolved - Followed closely - monitor with change in meds.   HTN - controlled.   HLD - 10/27/2019: Cholesterol 135; HDL 34; LDL Cholesterol 83; Triglycerides 89; VLDL 18  - Increase Lipitor to 8m qd  DM - Per primary team   Tobacco abuse - Recommended cessation - On nicotine patch  PAD - Followed by vascular - On ASA and statin     CRITICAL CARE Performed by: KEna Dawley MD  Total critical care time: 47  minutes Critical care was necessary to treat or prevent imminent or life-threatening deterioration.  Critical care was time spent personally by me (independent of APPs or residents) on the following activities: development of treatment plan with patient and/or surrogate as well as nursing, discussions with consultants, evaluation of patient's response to treatment, examination of patient, obtaining history from patient or surrogate, ordering and performing treatments and interventions, ordering and review of laboratory studies, ordering and review of radiographic studies, pulse oximetry and re-evaluation of patient's condition.  For questions or updates, please contact [place] Please consult [email or web address] for contact info under     Signed, Ena Dawley, MD  10/31/2019, 7:48 AM    Addendum: Coox O2sats 72%, I will decrease the rate of milrinone infusion from 0.375 --> 0.25, will follow Coox closely.  We will also remove central line.  This was communicated with patient's nurse.  Ena Dawley, MD 10/31/2019

## 2019-11-01 LAB — BASIC METABOLIC PANEL
Anion gap: 10 (ref 5–15)
BUN: 18 mg/dL (ref 6–20)
CO2: 24 mmol/L (ref 22–32)
Calcium: 8.4 mg/dL — ABNORMAL LOW (ref 8.9–10.3)
Chloride: 103 mmol/L (ref 98–111)
Creatinine, Ser: 1.01 mg/dL (ref 0.61–1.24)
GFR calc Af Amer: >60 mL/min (ref >60)
GFR calc non Af Amer: >60 mL/min (ref >60)
Glucose, Bld: 211 mg/dL — ABNORMAL HIGH (ref 70–99)
Potassium: 4.5 mmol/L (ref 3.5–5.1)
Sodium: 137 mmol/L (ref 135–145)

## 2019-11-01 LAB — CBC WITH DIFFERENTIAL/PLATELET
Abs Immature Granulocytes: 0.15 10*3/uL — ABNORMAL HIGH (ref 0.00–0.07)
Basophils Absolute: 0.1 10*3/uL (ref 0.0–0.1)
Basophils Relative: 0 %
Eosinophils Absolute: 0 10*3/uL (ref 0.0–0.5)
Eosinophils Relative: 0 %
HCT: 43.5 % (ref 39.0–52.0)
Hemoglobin: 13.8 g/dL (ref 13.0–17.0)
Immature Granulocytes: 1 %
Lymphocytes Relative: 7 %
Lymphs Abs: 1 10*3/uL (ref 0.7–4.0)
MCH: 28.9 pg (ref 26.0–34.0)
MCHC: 31.7 g/dL (ref 30.0–36.0)
MCV: 91.2 fL (ref 80.0–100.0)
Monocytes Absolute: 1.9 10*3/uL — ABNORMAL HIGH (ref 0.1–1.0)
Monocytes Relative: 12 %
Neutro Abs: 12.5 10*3/uL — ABNORMAL HIGH (ref 1.7–7.7)
Neutrophils Relative %: 80 %
Platelets: 157 10*3/uL (ref 150–400)
RBC: 4.77 MIL/uL (ref 4.22–5.81)
RDW: 15.7 % — ABNORMAL HIGH (ref 11.5–15.5)
WBC: 15.6 10*3/uL — ABNORMAL HIGH (ref 4.0–10.5)
nRBC: 0 % (ref 0.0–0.2)

## 2019-11-01 LAB — COOXEMETRY PANEL
Carboxyhemoglobin: 2.3 % — ABNORMAL HIGH (ref 0.5–1.5)
Methemoglobin: 1.1 % (ref 0.0–1.5)
O2 Saturation: 65.3 %
Total hemoglobin: 14.3 g/dL (ref 12.0–16.0)

## 2019-11-01 LAB — GLUCOSE, CAPILLARY
Glucose-Capillary: 200 mg/dL — ABNORMAL HIGH (ref 70–99)
Glucose-Capillary: 313 mg/dL — ABNORMAL HIGH (ref 70–99)
Glucose-Capillary: 286 mg/dL — ABNORMAL HIGH (ref 70–99)
Glucose-Capillary: 287 mg/dL — ABNORMAL HIGH (ref 70–99)

## 2019-11-01 LAB — CULTURE, BODY FLUID W GRAM STAIN -BOTTLE: Culture: NO GROWTH

## 2019-11-01 MED ORDER — AMIODARONE HCL IN DEXTROSE 360-4.14 MG/200ML-% IV SOLN
30.0000 mg/h | INTRAVENOUS | Status: DC
  Administered 2019-11-02 – 2019-11-04 (×5): 30 mg/h via INTRAVENOUS
  Filled 2019-11-01 (×5): qty 200

## 2019-11-01 MED ORDER — AMIODARONE HCL IN DEXTROSE 360-4.14 MG/200ML-% IV SOLN
INTRAVENOUS | Status: AC
  Administered 2019-11-01: 150 mg via INTRAVENOUS
  Filled 2019-11-01: qty 200

## 2019-11-01 MED ORDER — AMIODARONE HCL IN DEXTROSE 360-4.14 MG/200ML-% IV SOLN
60.0000 mg/h | INTRAVENOUS | Status: AC
  Administered 2019-11-01 (×2): 60 mg/h via INTRAVENOUS
  Filled 2019-11-01: qty 200

## 2019-11-01 MED ORDER — ORAL CARE MOUTH RINSE
15.0000 mL | Freq: Two times a day (BID) | OROMUCOSAL | Status: DC
  Administered 2019-11-01 – 2019-11-06 (×6): 15 mL via OROMUCOSAL

## 2019-11-01 MED ORDER — AMIODARONE LOAD VIA INFUSION: 150.0000 mg | Freq: Once | INTRAVENOUS | Status: AC

## 2019-11-01 NOTE — Progress Notes (Signed)
Progress Note  Patient Name: [name] Date of Encounter: 11/01/2019  [facility] Cardiologist: Kirk Ruths, MD   Subjective   Improved LE edema, CVP this am 6, his chest pain and SOB has improved significantly since yesterday, he is sitting in a recliner.  Inpatient Medications    Scheduled Meds: . acetaminophen  1,000 mg Oral Q6H   Or  . acetaminophen (TYLENOL) oral liquid 160 mg/5 mL  1,000 mg Per Tube Q6H  . aspirin EC  81 mg Oral Daily  . atorvastatin  40 mg Oral Daily  . bisacodyl  10 mg Oral Daily   Or  . bisacodyl  10 mg Rectal Daily  . chlorhexidine gluconate (MEDLINE KIT)  15 mL Mouth Rinse BID  . Chlorhexidine Gluconate Cloth  6 each Topical Daily  . clopidogrel  75 mg Oral Daily  . docusate sodium  200 mg Oral Daily  . furosemide  40 mg Intravenous BID  . insulin aspart  0-20 Units Subcutaneous TID WC  . insulin aspart  0-5 Units Subcutaneous QHS  . isosorbide dinitrate  10 mg Oral TID  . mouth rinse  15 mL Mouth Rinse BID  . metoCLOPramide (REGLAN) injection  10 mg Intravenous Q6H  . mupirocin ointment  1 application Nasal BID  . pantoprazole  40 mg Oral Daily  . sodium chloride flush  10-40 mL Intracatheter Q12H  . sodium chloride flush  3 mL Intravenous Q12H   Continuous Infusions: . sodium chloride Stopped (10/31/19 1124)  . sodium chloride    . sodium chloride 20 mL/hr at 10/29/19 2134  . epinephrine 1 mcg/min (10/31/19 0600)  . insulin Stopped (10/31/19 0233)  . lactated ringers    . lactated ringers    . lactated ringers Stopped (10/30/19 0601)  . milrinone 0.25 mcg/kg/min (11/01/19 0600)  . nitroGLYCERIN Stopped (10/30/19 1115)  . norepinephrine (LEVOPHED) Adult infusion    . phenylephrine (NEO-SYNEPHRINE) Adult infusion     PRN Meds: sodium chloride, dextrose, HYDROmorphone, ipratropium-albuterol, lactated ringers, levalbuterol, metoprolol tartrate, morphine injection, ondansetron (ZOFRAN) IV, sodium chloride flush, sodium  chloride flush   Vital Signs    Vitals:   11/01/19 0400 11/01/19 0500 11/01/19 0600 11/01/19 0700  BP: (!) 155/87 123/66 (!) 147/98 134/89  Pulse: (!) 104 (!) 103 (!) 110 99  Resp: (!) 26 18 13 14   Temp:    99.2 F (37.3 C)  TempSrc:    Oral  SpO2: 99% 95% 99% 98%  Weight:      Height:        Intake/Output Summary (Last 24 hours) at 11/01/2019 0740 Last data filed at 11/01/2019 0600 Gross per 24 hour  Intake 555.71 ml  Output 2995 ml  Net -2439.29 ml   Last 3 Weights 10/31/2019 10/30/2019 10/29/2019  Weight (lbs) 195 lb 1.7 oz 188 lb 4.4 oz 180 lb 8.9 oz  Weight (kg) 88.5 kg 85.4 kg 81.9 kg      Telemetry    NSR - Personally Reviewed  ECG    N/A  Physical Exam   GEN: in no distress Neck: +4 cm JVD, central line in place Cardiac: RRR, no murmurs, rubs, or gallops.  Dressing over thoracotomy scar, chest tubes Respiratory: crackles bilaterally. GI: Soft, nontender, non-distended  MS: 1+ BL LE  edema; No deformity. Neuro:  Nonfocal  Psych: Normal affect   Labs    High Sensitivity Troponin:   Recent Labs  Lab 10/24/19 2011 10/24/19 2218  TROPONINIHS 17 19*  Chemistry Recent Labs  Lab 10/26/19 0142 10/27/19 0428 10/27/19 1700 10/28/19 0845 10/29/19 0508 10/29/19 1528 10/30/19 1627 10/31/19 0524 11/01/19 0500  NA 139   < >  --    < > 140   < > 141 139 137  K 3.7   < >  --    < > 3.8   < > 4.0 4.4 4.5  CL 102   < >  --    < > 106   < > 110 108 103  CO2 27   < >  --    < > 23   < > 24 21* 24  GLUCOSE 225*   < >  --    < > 256*   < > 140* 144* 211*  BUN 20   < >  --    < > 21*   < > 16 13 18   CREATININE 1.25*   < >  --    < > 1.08   < > 0.90 0.93 1.01  CALCIUM 8.6*   < >  --    < > 8.9   < > 8.0* 8.1* 8.4*  PROT 6.1*  --  6.4*  --  6.4*  --   --   --   --   ALBUMIN 3.2*  --   --   --  3.1*  --   --   --   --   AST 27  --   --   --  29  --   --   --   --   ALT 26  --   --   --  29  --   --   --   --   ALKPHOS 106  --   --   --  120  --   --   --    --   BILITOT 1.2  --   --   --  1.6*  --   --   --   --   GFRNONAA >60   < >  --    < > >60   < > >60 >60 >60  GFRAA >60   < >  --    < > >60   < > >60 >60 >60  ANIONGAP 10   < >  --    < > 11   < > 7 10 10    < > = values in this interval not displayed.     Hematology Recent Labs  Lab 10/30/19 1627 10/31/19 0524 11/01/19 0500  WBC 16.3* 16.1* 15.6*  RBC 4.86 5.07 4.77  HGB 13.8 14.4 13.8  HCT 44.0 46.6 43.5  MCV 90.5 91.9 91.2  MCH 28.4 28.4 28.9  MCHC 31.4 30.9 31.7  RDW 15.2 15.4 15.7*  PLT 156 129* 157    BNP No results for input(s): BNP, PROBNP in the last 168 hours.   Radiology      Cardiac Studies   Limited Echo 10/26/19 1. No formed LV mural thrombus noted with Definity contrast.   FINDINGS  Left Ventricle: No formed LV mural thrombus noted with Definity contrast.   Echo 10/25/19 1. Left ventricular ejection fraction, by estimation, is 30 to 35%. The  left ventricle has moderately decreased function. The left ventricle  demonstrates regional wall motion abnormalities (see scoring  diagram/findings for description). There is  moderate left ventricular hypertrophy. Left ventricular diastolic  parameters are consistent with Grade III diastolic dysfunction  (restrictive). Suggest  Definity contrast study to more effectively exclude  apical thrombus - acoustic shadowing and false  tendon noted.  2. Right ventricular systolic function is mildly reduced. The right  ventricular size is normal. There is moderately elevated pulmonary artery  systolic pressure. The estimated right ventricular systolic pressure is  85.6 mmHg.  3. Left atrial size was mild to moderately dilated.  4. Right atrial size was mild to moderately dilated.  5. The mitral valve is abnormal, mildly thickened and with annular  calcification. Mild mitral valve regurgitation.  6. The tricuspid valve is abnormal.  7. The aortic valve is tricuspid. Aortic valve regurgitation is trivial.  8.  The inferior vena cava is dilated in size with <50% respiratory  variability, suggesting right atrial pressure of 15 mmHg.   Patient Profile     [name] is a 59 y.o. male with a hx of CAD, (acute MI 2005 with PTCA to Diag, then stent to LCX in 2007 , last cath 2008 with 20% LM, 50% LAD and 70%mLAD, ? Hx of EF 29% on myoview in 2013 but echo in 2013 with normal EF (60-65%), HTN, DM-2, HLD, + tobacco, chronic pain , PAD  who is being seen  for the evaluation of cardiomyopathy at the request of Dr. Rebeca Alert.  Assessment & Plan    CAD - s/p CABG on 7/8, LIMA to LAD; RIMA to PDA -- Continue ASA and statin  Acute combined CHF - Echo this admission showed LVEF of 30-35% and grade III DD (EF was 60-65% in 2013). No LV thrombus on limited echo.  - Diuresed > 13L and Weight down 26 lbs, post surgery weight up, CVP 8, PAP 65/25 mmHg, and CXR worse pulmonary edema this am on 7/10 ---> Lasix 40 mg iv BID was restarted with negative fluid balance 2.5 L overnight. - Coox O2sats 72% , we decreased milrinone infusion from 0.375 --> 0.25, CVP this am 13 and Coox sats 65%, I would continue the same infusion rate for milrinone and same dose of diuretics, continue to follow Coox, CVP< crea, daily weights I&O closely.  We will also remove central line.  This was communicated with patient's nurse. - epinephrine off - previously on coreg and entresto, restart once off milrinone and BP improved  AKi - Resolved - Followed closely - monitor with change in meds.   HTN - controlled.   HLD - 10/27/2019: Cholesterol 135; HDL 34; LDL Cholesterol 83; Triglycerides 89; VLDL 18  - Increase Lipitor to 78m qd  DM - Per primary team   Tobacco abuse - Recommended cessation - On nicotine patch  PAD - Followed by vascular - On ASA and statin    CRITICAL CARE Performed by: KEna Dawley MD  Total critical care time: 40 minutes Critical care was necessary to treat or prevent imminent or  life-threatening deterioration.  Critical care was time spent personally by me (independent of APPs or residents) on the following activities: development of treatment plan with patient and/or surrogate as well as nursing, discussions with consultants, evaluation of patient's response to treatment, examination of patient, obtaining history from patient or surrogate, ordering and performing treatments and interventions, ordering and review of laboratory studies, ordering and review of radiographic studies, pulse oximetry and re-evaluation of patient's condition.  For questions or updates, please contact [place] consult [email or web address] for contact info under     Signed, KEna Dawley MD  11/01/2019, 7:40 AM

## 2019-11-01 NOTE — Progress Notes (Signed)
      [address]       [place] [address]             [phone]                 3 Days Post-Op Procedure(s) (LRB): CORONARY ARTERY BYPASS GRAFTING (CABG) using LIMA to LAD; RIMA to PDA; and Left radial harvest vein to Circ. (N/A) RADIAL ARTERY HARVEST (Left) TRANSESOPHAGEAL ECHOCARDIOGRAM (TEE) (N/A)   Events: No events Diuresing from CTs _______________________________________________________________ Vitals: BP 113/82 (BP Location: Right Arm)   Pulse 99   Temp 99.2 F (37.3 C) (Oral)   Resp (!) 24   Ht 5\' 7"  (1.702 m)   Wt 88.5 kg   SpO2 93%   BMI 30.56 kg/m   - Neuro: alert NAD  - Cardiovascular: sinus   Drips: milr 0.25  CVP:  [4 mmHg-13 mmHg] 13 mmHg  - Pulm: EWOB, on RA    ABG    Component Value Date/Time   PHART 7.271 (L) 10/30/2019 0506   PCO2ART 48.5 (H) 10/30/2019 0506   PO2ART 75 (L) 10/30/2019 0506   HCO3 22.2 10/30/2019 0506   TCO2 24 10/30/2019 0506   ACIDBASEDEF 5.0 (H) 10/30/2019 0506   O2SAT 65.3 11/01/2019 0512    - Abd: abd soft - Extremity: warm  .Intake/Output      07/10 0701 - 07/11 0700 07/11 0701 - 07/12 0700   P.O. 240 720   I.V. (mL/kg) 215.7 (2.4) 9.2 (0.1)   IV Piggyback 100    Total Intake(mL/kg) 555.7 (6.3) 729.2 (8.2)   Urine (mL/kg/hr) 2475 (1.2)    Chest Tube 520    Total Output 2995    Net -2439.3 +729.2           _______________________________________________________________ Labs: CBC Latest Ref Rng & Units 11/01/2019 10/31/2019 10/30/2019  WBC 4.0 - 10.5 K/uL 15.6(H) 16.1(H) 16.3(H)  Hemoglobin 13.0 - 17.0 g/dL 13.8 14.4 13.8  Hematocrit 39 - 52 % 43.5 46.6 44.0  Platelets 150 - 400 K/uL 157 129(L) 156   CMP Latest Ref Rng & Units 11/01/2019 10/31/2019 10/30/2019  Glucose 70 - 99 mg/dL 211(H) 144(H) 140(H)  BUN 6 - 20 mg/dL 18 13 16   Creatinine 0.61 - 1.24 mg/dL 1.01 0.93 0.90  Sodium 135 - 145 mmol/L 137 139 141  Potassium 3.5 - 5.1 mmol/L 4.5 4.4 4.0  Chloride 98 - 111 mmol/L 103 108  110  CO2 22 - 32 mmol/L 24 21(L) 24  Calcium 8.9 - 10.3 mg/dL 8.4(L) 8.1(L) 8.0(L)  Total Protein 6.5 - 8.1 g/dL - - -  Total Bilirubin 0.3 - 1.2 mg/dL - - -  Alkaline Phos 38 - 126 U/L - - -  AST 15 - 41 U/L - - -  ALT 0 - 44 U/L - - -    CXR: Pending  _______________________________________________________________  Assessment and Plan: POD 3 s/p CABG, doing well  Neuro: pain controlled CV: on milr.  Will wean as needed.   Pulm: continue pulm toilet.  CT output > 500, will keep for now Renal: good uop, continue diuresis GI: on diet Heme: stable ID: afebrile. WBC stable Endo: SSI Dispo: continue ICU care  Melodie Bouillon, MD 11/01/2019 10:00 AM

## 2019-11-02 ENCOUNTER — Inpatient Hospital Stay (HOSPITAL_COMMUNITY): Payer: Medicaid Other

## 2019-11-02 ENCOUNTER — Inpatient Hospital Stay: Payer: Self-pay

## 2019-11-02 LAB — GLUCOSE, CAPILLARY
Glucose-Capillary: 259 mg/dL — ABNORMAL HIGH (ref 70–99)
Glucose-Capillary: 275 mg/dL — ABNORMAL HIGH (ref 70–99)
Glucose-Capillary: 287 mg/dL — ABNORMAL HIGH (ref 70–99)
Glucose-Capillary: 279 mg/dL — ABNORMAL HIGH (ref 70–99)

## 2019-11-02 LAB — BASIC METABOLIC PANEL
Anion gap: 11 (ref 5–15)
BUN: 27 mg/dL — ABNORMAL HIGH (ref 6–20)
CO2: 24 mmol/L (ref 22–32)
Calcium: 8.3 mg/dL — ABNORMAL LOW (ref 8.9–10.3)
Chloride: 102 mmol/L (ref 98–111)
Creatinine, Ser: 1.26 mg/dL — ABNORMAL HIGH (ref 0.61–1.24)
GFR calc Af Amer: >60 mL/min (ref >60)
GFR calc non Af Amer: >60 mL/min (ref >60)
Glucose, Bld: 286 mg/dL — ABNORMAL HIGH (ref 70–99)
Potassium: 3.7 mmol/L (ref 3.5–5.1)
Sodium: 137 mmol/L (ref 135–145)

## 2019-11-02 LAB — COOXEMETRY PANEL
Carboxyhemoglobin: 1.4 % (ref 0.5–1.5)
Methemoglobin: 0.7 % (ref 0.0–1.5)
O2 Saturation: 57.1 %
Total hemoglobin: 14 g/dL (ref 12.0–16.0)

## 2019-11-02 LAB — CBC
HCT: 43.3 % (ref 39.0–52.0)
Hemoglobin: 13.6 g/dL (ref 13.0–17.0)
MCH: 28.3 pg (ref 26.0–34.0)
MCHC: 31.4 g/dL (ref 30.0–36.0)
MCV: 90 fL (ref 80.0–100.0)
Platelets: 219 10*3/uL (ref 150–400)
RBC: 4.81 MIL/uL (ref 4.22–5.81)
RDW: 15.9 % — ABNORMAL HIGH (ref 11.5–15.5)
WBC: 11.8 10*3/uL — ABNORMAL HIGH (ref 4.0–10.5)
nRBC: 0 % (ref 0.0–0.2)

## 2019-11-02 MED ORDER — SODIUM CHLORIDE 0.9% FLUSH
10.0000 mL | Freq: Two times a day (BID) | INTRAVENOUS | Status: DC
  Administered 2019-11-02: 10 mL

## 2019-11-02 MED ORDER — POTASSIUM CHLORIDE CRYS ER 20 MEQ PO TBCR
40.0000 meq | EXTENDED_RELEASE_TABLET | Freq: Once | ORAL | Status: AC
  Administered 2019-11-02: 40 meq via ORAL
  Filled 2019-11-02: qty 2

## 2019-11-02 MED ORDER — SODIUM CHLORIDE 0.9% FLUSH: 10.0000 mL | INTRAVENOUS | Status: DC | PRN

## 2019-11-02 MED FILL — Heparin Sodium (Porcine) Inj 1000 Unit/ML: INTRAMUSCULAR | Qty: 10 | Status: AC

## 2019-11-02 MED FILL — Sodium Chloride IV Soln 0.9%: INTRAVENOUS | Qty: 3000 | Status: AC

## 2019-11-02 MED FILL — Calcium Chloride Inj 10%: INTRAVENOUS | Qty: 10 | Status: AC

## 2019-11-02 MED FILL — Electrolyte-R (PH 7.4) Solution: INTRAVENOUS | Qty: 4000 | Status: AC

## 2019-11-02 MED FILL — Sodium Bicarbonate IV Soln 8.4%: INTRAVENOUS | Qty: 50 | Status: AC

## 2019-11-02 MED FILL — Mannitol IV Soln 20%: INTRAVENOUS | Qty: 500 | Status: AC

## 2019-11-02 NOTE — Progress Notes (Signed)
Peripherally Inserted Central Catheter Placement  The IV Nurse has discussed with the patient and/or persons authorized to consent for the patient, the purpose of this procedure and the potential benefits and risks involved with this procedure.  The benefits include less needle sticks, lab draws from the catheter, and the patient may be discharged home with the catheter. Risks include, but not limited to, infection, bleeding, blood clot (thrombus formation), and puncture of an artery; nerve damage and irregular heartbeat and possibility to perform a PICC exchange if needed/ordered by physician.  Alternatives to this procedure were also discussed.  Bard Power PICC patient education guide, fact sheet on infection prevention and patient information card has been provided to patient /or left at bedside.    PICC Placement Documentation  PICC Double Lumen 41/93/79 PICC Right Basilic 38 cm 0 cm (Active)  Site Assessment Clean;Dry;Intact 11/02/19 1120  Lumen #1 Status Flushed;Saline locked;Blood return noted 11/02/19 1120  Lumen #2 Status Flushed;Blood return noted;Saline locked 11/02/19 1120  Dressing Type Transparent;Securing device 11/02/19 1120  Dressing Status Clean;Dry;Intact;Antimicrobial disc in place 11/02/19 1120  Dressing Change Due 11/09/19 11/02/19 1120       [name] 11/02/2019, 11:25 AM

## 2019-11-02 NOTE — CV Procedure (Signed)
Progress Note  Patient Name: [name] Date of Encounter: 11/02/2019  [facility] Cardiologist: Kirk Ruths, MD   Subjective   Pt c/o chest wall pain.   Inpatient Medications    Scheduled Meds: . acetaminophen  1,000 mg Oral Q6H   Or  . acetaminophen (TYLENOL) oral liquid 160 mg/5 mL  1,000 mg Per Tube Q6H  . aspirin EC  81 mg Oral Daily  . atorvastatin  40 mg Oral Daily  . bisacodyl  10 mg Oral Daily   Or  . bisacodyl  10 mg Rectal Daily  . Chlorhexidine Gluconate Cloth  6 each Topical Daily  . clopidogrel  75 mg Oral Daily  . docusate sodium  200 mg Oral Daily  . furosemide  40 mg Intravenous BID  . insulin aspart  0-20 Units Subcutaneous TID WC  . insulin aspart  0-5 Units Subcutaneous QHS  . isosorbide dinitrate  10 mg Oral TID  . mouth rinse  15 mL Mouth Rinse BID  . metoCLOPramide (REGLAN) injection  10 mg Intravenous Q6H  . mupirocin ointment  1 application Nasal BID  . pantoprazole  40 mg Oral Daily  . sodium chloride flush  10-40 mL Intracatheter Q12H   Continuous Infusions: . amiodarone 30 mg/hr (11/02/19 0700)  . milrinone 0.25 mcg/kg/min (11/02/19 0700)  . norepinephrine (LEVOPHED) Adult infusion     PRN Meds: HYDROmorphone, levalbuterol, metoprolol tartrate, morphine injection, ondansetron (ZOFRAN) IV, sodium chloride flush   Vital Signs    Vitals:   11/02/19 0500 11/02/19 0600 11/02/19 0700 11/02/19 0736  BP: (!) 115/59 107/76 131/69   Pulse: 98 99 99   Resp: (!) 27 (!) 22 13   Temp:    97.8 F (36.6 C)  TempSrc:    Oral  SpO2: 100% 99% 97%   Weight:      Height:        Intake/Output Summary (Last 24 hours) at 11/02/2019 0822 Last data filed at 11/02/2019 0700 Gross per 24 hour  Intake 1533.07 ml  Output 1560 ml  Net -26.93 ml   Last 3 Weights 11/02/2019 10/31/2019 10/30/2019  Weight (lbs) 191 lb 12.8 oz 195 lb 1.7 oz 188 lb 4.4 oz  Weight (kg) 87 kg 88.5 kg 85.4 kg      Telemetry    Atrial fib - Personally  Reviewed  ECG     No AM EKG- Personally Reviewed  Physical Exam   GEN: Appears uncomfortable.  Neck: No JVD Cardiac: irreg irreg, no murmurs, rubs, or gallops.  Respiratory: Clear to auscultation bilaterally. GI: Soft, nontender, non-distended  MS: No edema; No deformity. Neuro:  Nonfocal  Psych: Normal affect   Labs    High Sensitivity Troponin:   Recent Labs  Lab 10/24/19 2011 10/24/19 2218  TROPONINIHS 17 19*      Chemistry Recent Labs  Lab 10/27/19 1700 10/28/19 0845 10/29/19 0508 10/29/19 1528 10/31/19 0524 11/01/19 0500 11/02/19 0659  NA  --    < > 140   < > 139 137 137  K  --    < > 3.8   < > 4.4 4.5 3.7  CL  --    < > 106   < > 108 103 102  CO2  --    < > 23   < > 21* 24 24  GLUCOSE  --    < > 256*   < > 144* 211* 286*  BUN  --    < > 21*   < >  13 18 27*  CREATININE  --    < > 1.08   < > 0.93 1.01 1.26*  CALCIUM  --    < > 8.9   < > 8.1* 8.4* 8.3*  PROT 6.4*  --  6.4*  --   --   --   --   ALBUMIN  --   --  3.1*  --   --   --   --   AST  --   --  29  --   --   --   --   ALT  --   --  29  --   --   --   --   ALKPHOS  --   --  120  --   --   --   --   BILITOT  --   --  1.6*  --   --   --   --   GFRNONAA  --    < > >60   < > >60 >60 >60  GFRAA  --    < > >60   < > >60 >60 >60  ANIONGAP  --    < > 11   < > 10 10 11    < > = values in this interval not displayed.     Hematology Recent Labs  Lab 10/31/19 0524 11/01/19 0500 11/02/19 0659  WBC 16.1* 15.6* 11.8*  RBC 5.07 4.77 4.81  HGB 14.4 13.8 13.6  HCT 46.6 43.5 43.3  MCV 91.9 91.2 90.0  MCH 28.4 28.9 28.3  MCHC 30.9 31.7 31.4  RDW 15.4 15.7* 15.9*  PLT 129* 157 219    BNPNo results for input(s): BNP, PROBNP in the last 168 hours.   DDimer No results for input(s): DDIMER in the last 168 hours.   Radiology    [place] EKG SITE RITE  Result Date: 11/02/2019 If Site Rite image not attached, placement could not be confirmed due to current cardiac rhythm.   Cardiac Studies   Limited Echo  10/26/19 1. No formed LV mural thrombus noted with Definity contrast.   FINDINGS  Left Ventricle: No formed LV mural thrombus noted with Definity contrast.   Echo 10/25/19 1. Left ventricular ejection fraction, by estimation, is 30 to 35%. The  left ventricle has moderately decreased function. The left ventricle  demonstrates regional wall motion abnormalities (see scoring  diagram/findings for description). There is  moderate left ventricular hypertrophy. Left ventricular diastolic  parameters are consistent with Grade III diastolic dysfunction  (restrictive). Suggest Definity contrast study to more effectively exclude  apical thrombus - acoustic shadowing and false  tendon noted.  2. Right ventricular systolic function is mildly reduced. The right  ventricular size is normal. There is moderately elevated pulmonary artery  systolic pressure. The estimated right ventricular systolic pressure is  75.6 mmHg.  3. Left atrial size was mild to moderately dilated.  4. Right atrial size was mild to moderately dilated.  5. The mitral valve is abnormal, mildly thickened and with annular  calcification. Mild mitral valve regurgitation.  6. The tricuspid valve is abnormal.  7. The aortic valve is tricuspid. Aortic valve regurgitation is trivial.  8. The inferior vena cava is dilated in size with <50% respiratory  variability, suggesting right atrial pressure of 15 mmHg.   Patient Profile     [name] a 59 y.o.malewith a hx of CAD, (acute MI 2005 with PTCA to Diag, then stent to LCX in 2007 , last  cath 2008 with 20% LM, 50% LAD and 70%mLAD, ? Hx of EF 29% on myoview in 2013 but echo in 2013 with normal EF (60-65%), HTN, DM-2, HLD, + tobacco, chronic pain , PADwho was admitted with acute systolic CHF and chest pain/unstable angina  Assessment & Plan    1. CAD s/p CABG with LIMA to LAD and RIMA to PDA. He is on ASA and statin.   2. Acute combined CHF: Echo with  LVEF=30-35%. He is He has diuresed well, down 19 liters since admission on milrinone and IV Lasix. CVP 14 today. His milrinone is being weaned. Continue IV Lasix.   3. Atrial fib: Continue amiodarone drip for rate control.    For questions or updates, please contact [place] Please consult [email or web address] for contact info under        Signed, Lauree Chandler, MD  11/02/2019, 8:22 AM

## 2019-11-02 NOTE — Plan of Care (Signed)
  Problem: Activity: Goal: Capacity to carry out activities will improve 11/02/2019 0004 by Reinaldo Berber, RN Outcome: Progressing 11/02/2019 0003 by Reinaldo Berber, RN Outcome: Progressing   Problem: Cardiac: Goal: Ability to achieve and maintain adequate cardiopulmonary perfusion will improve 11/02/2019 0004 by Reinaldo Berber, RN Outcome: Progressing 11/02/2019 0003 by Reinaldo Berber, RN Outcome: Progressing   Problem: Education: Goal: Knowledge of General Education information will improve Description: Including pain rating scale, medication(s)/side effects and non-pharmacologic comfort measures Outcome: Progressing   Problem: Health Behavior/Discharge Planning: Goal: Ability to manage health-related needs will improve 11/02/2019 0004 by Reinaldo Berber, RN Outcome: Progressing 11/02/2019 0003 by Reinaldo Berber, RN Outcome: Progressing   Problem: Clinical Measurements: Goal: Ability to maintain clinical measurements within normal limits will improve 11/02/2019 0004 by Reinaldo Berber, RN Outcome: Progressing 11/02/2019 0003 by Reinaldo Berber, RN Outcome: Progressing Goal: Will remain free from infection 11/02/2019 0004 by Reinaldo Berber, RN Outcome: Progressing 11/02/2019 0003 by Reinaldo Berber, RN Outcome: Progressing Goal: Diagnostic test results will improve 11/02/2019 0004 by Reinaldo Berber, RN Outcome: Progressing 11/02/2019 0003 by Reinaldo Berber, RN Outcome: Progressing Goal: Respiratory complications will improve 11/02/2019 0004 by Reinaldo Berber, RN Outcome: Progressing 11/02/2019 0003 by Reinaldo Berber, RN Outcome: Progressing Goal: Cardiovascular complication will be avoided 11/02/2019 0004 by Reinaldo Berber, RN Outcome: Progressing 11/02/2019 0003 by Reinaldo Berber, RN Outcome: Progressing   Problem: Activity: Goal: Risk for activity intolerance will decrease 11/02/2019 0004 by Reinaldo Berber, RN Outcome:  Progressing 11/02/2019 0003 by Reinaldo Berber, RN Outcome: Progressing   Problem: Nutrition: Goal: Adequate nutrition will be maintained 11/02/2019 0004 by Reinaldo Berber, RN Outcome: Progressing 11/02/2019 0003 by Reinaldo Berber, RN Outcome: Progressing   Problem: Coping: Goal: Level of anxiety will decrease 11/02/2019 0004 by Reinaldo Berber, RN Outcome: Progressing 11/02/2019 0003 by Reinaldo Berber, RN Outcome: Progressing   Problem: Elimination: Goal: Will not experience complications related to bowel motility 11/02/2019 0004 by Reinaldo Berber, RN Outcome: Progressing 11/02/2019 0003 by Reinaldo Berber, RN Outcome: Progressing Goal: Will not experience complications related to urinary retention 11/02/2019 0004 by Reinaldo Berber, RN Outcome: Progressing 11/02/2019 0003 by Reinaldo Berber, RN Outcome: Progressing   Problem: Pain Managment: Goal: General experience of comfort will improve 11/02/2019 0004 by Reinaldo Berber, RN Outcome: Progressing 11/02/2019 0003 by Reinaldo Berber, RN Outcome: Progressing   Problem: Safety: Goal: Ability to remain free from injury will improve 11/02/2019 0004 by Reinaldo Berber, RN Outcome: Progressing 11/02/2019 0003 by Reinaldo Berber, RN Outcome: Progressing   Problem: Skin Integrity: Goal: Risk for impaired skin integrity will decrease 11/02/2019 0004 by Reinaldo Berber, RN Outcome: Progressing 11/02/2019 0003 by Reinaldo Berber, RN Outcome: Progressing   Problem: Education: Goal: Knowledge of the prescribed therapeutic regimen will improve Outcome: Progressing   Problem: Activity: Goal: Risk for activity intolerance will decrease Outcome: Progressing   Problem: Cardiac: Goal: Will achieve and/or maintain hemodynamic stability Outcome: Progressing   Problem: Clinical Measurements: Goal: Postoperative complications will be avoided or minimized Outcome: Progressing   Problem:  Respiratory: Goal: Respiratory status will improve Outcome: Progressing   Problem: Skin Integrity: Goal: Wound healing without signs and symptoms of infection Outcome: Progressing Goal: Risk for impaired skin integrity will decrease Outcome: Progressing   Problem: Urinary Elimination: Goal: Ability to achieve and maintain adequate renal perfusion and functioning will improve Outcome: Progressing

## 2019-11-02 NOTE — Progress Notes (Signed)
Patient ID: AZHAR KNOPE, male   DOB: 06/23/1960, 59 y.o.   MRN: [id number] EVENING ROUNDS NOTE :     [address]       [place] [address]             [phone]                 4 Days Post-Op Procedure(s) (LRB): CORONARY ARTERY BYPASS GRAFTING (CABG) using LIMA to LAD; RIMA to PDA; and Left radial harvest vein to Circ. (N/A) RADIAL ARTERY HARVEST (Left) TRANSESOPHAGEAL ECHOCARDIOGRAM (TEE) (N/A)  Total Length of Stay:  LOS: 8 days  BP 131/69   Pulse 99   Temp 97.8 F (36.6 C) (Oral)   Resp 13   Ht 5\' 7"  (1.702 m)   Wt 87 kg   SpO2 97%   BMI 30.04 kg/m   .Intake/Output      07/11 0701 - 07/12 0700 07/12 0701 - 07/13 0700   P.O. 1680    I.V. (mL/kg) 582.3 (6.7)    IV Piggyback     Total Intake(mL/kg) 2262.3 (26)    Urine (mL/kg/hr) 1100 (0.5) 400 (0.5)   Drains 0    Chest Tube 540    Total Output 1640 400   Net +622.3 -400          . amiodarone 30 mg/hr (11/02/19 0700)  . milrinone 0.25 mcg/kg/min (11/02/19 0700)  . norepinephrine (LEVOPHED) Adult infusion       Lab Results  Component Value Date   WBC 11.8 (H) 11/02/2019   HGB 13.6 11/02/2019   HCT 43.3 11/02/2019   PLT 219 11/02/2019   GLUCOSE 286 (H) 11/02/2019   CHOL 135 10/27/2019   TRIG 89 10/27/2019   HDL 34 (L) 10/27/2019   LDLCALC 83 10/27/2019   ALT 29 10/29/2019   AST 29 10/29/2019   NA 137 11/02/2019   K 3.7 11/02/2019   CL 102 11/02/2019   CREATININE 1.26 (H) 11/02/2019   BUN 27 (H) 11/02/2019   CO2 24 11/02/2019   TSH 1.083 Test methodology is 3rd generation TSH 01/01/2007   INR 1.5 (H) 10/29/2019   HGBA1C 10.6 (H) 10/25/2019    To get lasix tonight Ct output decreasing In a fib on IV Cordrone drip  Grace Isaac MD  Beeper [phone] Office [phone] 11/02/2019 4:45 PM

## 2019-11-02 NOTE — Progress Notes (Signed)
Inpatient Diabetes Program Recommendations  AACE/ADA: New Consensus Statement on Inpatient Glycemic Control (2015)  Target Ranges:  Prepandial:   less than 140 mg/dL      Peak postprandial:   less than 180 mg/dL (1-2 hours)      Critically ill patients:  140 - 180 mg/dL   Lab Results  Component Value Date   GLUCAP 287 (H) 11/02/2019   HGBA1C 10.6 (H) 10/25/2019    Review of Glycemic Control  CBGs today: 259, 275, 287 mg/dL HgbA1C - 10.6%   Inpatient Diabetes Program Recommendations:     Add Lantus 10 units QHS Add Novolog 3 units tidwc for meal coverage insulin Add CHO mod med to heart healthy diet.  Follow closely.  Thank you. Lorenda Peck, RD, LDN, CDE Inpatient Diabetes Coordinator [phone]

## 2019-11-03 ENCOUNTER — Inpatient Hospital Stay (HOSPITAL_COMMUNITY): Payer: Medicaid Other

## 2019-11-03 DIAGNOSIS — I200 Unstable angina: Secondary | ICD-10-CM

## 2019-11-03 DIAGNOSIS — I480 Paroxysmal atrial fibrillation: Secondary | ICD-10-CM

## 2019-11-03 LAB — CBC
HCT: 41.6 % (ref 39.0–52.0)
Hemoglobin: 13.4 g/dL (ref 13.0–17.0)
MCH: 28.6 pg (ref 26.0–34.0)
MCHC: 32.2 g/dL (ref 30.0–36.0)
MCV: 88.9 fL (ref 80.0–100.0)
Platelets: 278 10*3/uL (ref 150–400)
RBC: 4.68 MIL/uL (ref 4.22–5.81)
RDW: 15.9 % — ABNORMAL HIGH (ref 11.5–15.5)
WBC: 10.1 10*3/uL (ref 4.0–10.5)
nRBC: 0 % (ref 0.0–0.2)

## 2019-11-03 LAB — BASIC METABOLIC PANEL
Anion gap: 11 (ref 5–15)
BUN: 29 mg/dL — ABNORMAL HIGH (ref 6–20)
CO2: 24 mmol/L (ref 22–32)
Calcium: 8.2 mg/dL — ABNORMAL LOW (ref 8.9–10.3)
Chloride: 102 mmol/L (ref 98–111)
Creatinine, Ser: 1.16 mg/dL (ref 0.61–1.24)
GFR calc Af Amer: >60 mL/min (ref >60)
GFR calc non Af Amer: >60 mL/min (ref >60)
Glucose, Bld: 251 mg/dL — ABNORMAL HIGH (ref 70–99)
Potassium: 3.9 mmol/L (ref 3.5–5.1)
Sodium: 137 mmol/L (ref 135–145)

## 2019-11-03 LAB — COOXEMETRY PANEL
Carboxyhemoglobin: 1.3 % (ref 0.5–1.5)
Methemoglobin: 0.9 % (ref 0.0–1.5)
O2 Saturation: 58.3 %
Total hemoglobin: 13.8 g/dL (ref 12.0–16.0)

## 2019-11-03 LAB — GLUCOSE, CAPILLARY
Glucose-Capillary: 185 mg/dL — ABNORMAL HIGH (ref 70–99)
Glucose-Capillary: 336 mg/dL — ABNORMAL HIGH (ref 70–99)
Glucose-Capillary: 191 mg/dL — ABNORMAL HIGH (ref 70–99)
Glucose-Capillary: 184 mg/dL — ABNORMAL HIGH (ref 70–99)

## 2019-11-03 MED ORDER — POTASSIUM CHLORIDE CRYS ER 20 MEQ PO TBCR
40.0000 meq | EXTENDED_RELEASE_TABLET | Freq: Once | ORAL | Status: AC
  Administered 2019-11-03: 40 meq via ORAL
  Filled 2019-11-03: qty 2

## 2019-11-03 MED ORDER — METOPROLOL TARTRATE 12.5 MG HALF TABLET
12.5000 mg | ORAL_TABLET | Freq: Two times a day (BID) | ORAL | Status: DC
  Administered 2019-11-03 – 2019-11-06 (×7): 12.5 mg via ORAL
  Filled 2019-11-03 (×7): qty 1

## 2019-11-03 MED ORDER — FUROSEMIDE 10 MG/ML IJ SOLN
60.0000 mg | Freq: Two times a day (BID) | INTRAMUSCULAR | Status: DC
  Administered 2019-11-03 – 2019-11-06 (×7): 60 mg via INTRAVENOUS
  Filled 2019-11-03 (×7): qty 6

## 2019-11-03 MED FILL — Heparin Sodium (Porcine) Inj 1000 Unit/ML: INTRAMUSCULAR | Qty: 30 | Status: AC

## 2019-11-03 MED FILL — Magnesium Sulfate Inj 50%: INTRAMUSCULAR | Qty: 10 | Status: AC

## 2019-11-03 MED FILL — Potassium Chloride Inj 2 mEq/ML: INTRAVENOUS | Qty: 40 | Status: AC

## 2019-11-03 NOTE — Progress Notes (Signed)
5 Days Post-Op Procedure(s) (LRB): CORONARY ARTERY BYPASS GRAFTING (CABG) using LIMA to LAD; RIMA to PDA; and Left radial harvest vein to Circ. (N/A) RADIAL ARTERY HARVEST (Left) TRANSESOPHAGEAL ECHOCARDIOGRAM (TEE) (N/A) Subjective: Feeling better  Objective: Vital signs in last 24 hours: Temp:  [97.8 F (36.6 C)-98.6 F (37 C)] 98.6 F (37 C) (07/13 0400) Pulse Rate:  [91-104] 104 (07/13 0700) Cardiac Rhythm: Atrial fibrillation (07/13 0400) Resp:  [7-26] 17 (07/13 0700) BP: (102-148)/(67-99) 137/89 (07/13 0700) SpO2:  [89 %-100 %] 95 % (07/13 0700) Weight:  [87.1 kg] 87.1 kg (07/13 0500)  Hemodynamic parameters for last 24 hours:    Intake/Output from previous day: 07/12 0701 - 07/13 0700 In: 474.6 [I.V.:474.6] Out: 1490 [Urine:1150; Chest Tube:340] Intake/Output this shift: No intake/output data recorded.  General appearance: alert and cooperative Neurologic: intact Heart: regular rate and rhythm, S1, S2 normal, no murmur, click, rub or gallop Lungs: clear to auscultation bilaterally Abdomen: soft, non-tender; bowel sounds normal; no masses,  no organomegaly  Lab Results: Recent Labs    11/02/19 0659 11/03/19 0252  WBC 11.8* 10.1  HGB 13.6 13.4  HCT 43.3 41.6  PLT 219 278   BMET:  Recent Labs    11/02/19 0659 11/03/19 0252  NA 137 137  K 3.7 3.9  CL 102 102  CO2 24 24  GLUCOSE 286* 251*  BUN 27* 29*  CREATININE 1.26* 1.16  CALCIUM 8.3* 8.2*    PT/INR: No results for input(s): LABPROT, INR in the last 72 hours. ABG    Component Value Date/Time   PHART 7.271 (L) 10/30/2019 0506   HCO3 22.2 10/30/2019 0506   TCO2 24 10/30/2019 0506   ACIDBASEDEF 5.0 (H) 10/30/2019 0506   O2SAT 58.3 11/03/2019 0314   CBG (last 3)  Recent Labs    11/02/19 1553 11/02/19 2132 11/03/19 0636  GLUCAP 287* 279* 185*    Assessment/Plan: S/P Procedure(s) (LRB): CORONARY ARTERY BYPASS GRAFTING (CABG) using LIMA to LAD; RIMA to PDA; and Left radial harvest vein to  Circ. (N/A) RADIAL ARTERY HARVEST (Left) TRANSESOPHAGEAL ECHOCARDIOGRAM (TEE) (N/A) Mobilize Diuresis See progression orders   LOS: 9 days    Wonda Olds 11/03/2019

## 2019-11-03 NOTE — Progress Notes (Signed)
Progress Note  Patient Name: [name] Date of Encounter: 11/03/2019  [facility] Cardiologist: Kirk Ruths, MD   Subjective   Feeling much better. Still in atrial fib. Rates 100-120 bpm  Inpatient Medications    Scheduled Meds: . acetaminophen  1,000 mg Oral Q6H   Or  . acetaminophen (TYLENOL) oral liquid 160 mg/5 mL  1,000 mg Per Tube Q6H  . aspirin EC  81 mg Oral Daily  . atorvastatin  40 mg Oral Daily  . bisacodyl  10 mg Oral Daily   Or  . bisacodyl  10 mg Rectal Daily  . Chlorhexidine Gluconate Cloth  6 each Topical Daily  . clopidogrel  75 mg Oral Daily  . docusate sodium  200 mg Oral Daily  . furosemide  60 mg Intravenous BID  . insulin aspart  0-20 Units Subcutaneous TID WC  . insulin aspart  0-5 Units Subcutaneous QHS  . mouth rinse  15 mL Mouth Rinse BID  . metoCLOPramide (REGLAN) injection  10 mg Intravenous Q6H  . pantoprazole  40 mg Oral Daily   Continuous Infusions: . amiodarone 30 mg/hr (11/03/19 0700)  . milrinone 0.125 mcg/kg/min (11/03/19 0700)   PRN Meds: HYDROmorphone, levalbuterol, metoprolol tartrate, ondansetron (ZOFRAN) IV   Vital Signs    Vitals:   11/03/19 0500 11/03/19 0640 11/03/19 0700 11/03/19 0748  BP: (!) 134/98 (!) 121/99 137/89   Pulse: 100 99 (!) 104   Resp: 17 (!) 7 17   Temp:    97.9 F (36.6 C)  TempSrc:    Oral  SpO2: 100% 96% 95%   Weight: 87.1 kg     Height:        Intake/Output Summary (Last 24 hours) at 11/03/2019 0920 Last data filed at 11/03/2019 0700 Gross per 24 hour  Intake 474.56 ml  Output 1490 ml  Net -1015.44 ml   Last 3 Weights 11/03/2019 11/02/2019 10/31/2019  Weight (lbs) 192 lb 0.3 oz 191 lb 12.8 oz 195 lb 1.7 oz  Weight (kg) 87.1 kg 87 kg 88.5 kg      Telemetry    Atrial fib- Personally Reviewed  ECG    No EKG today- Personally Reviewed  Physical Exam   GEN: No acute distress.   Neck: No JVD Cardiac: irreg irreg. No murmurs, rubs, or gallops.  Respiratory: Clear to  auscultation bilaterally. GI: Soft, nontender, non-distended  MS: No edema; No deformity. Neuro:  Nonfocal  Psych: Normal affect   Labs    High Sensitivity Troponin:   Recent Labs  Lab 10/24/19 2011 10/24/19 2218  TROPONINIHS 17 19*      Chemistry Recent Labs  Lab 10/27/19 1700 10/28/19 0845 10/29/19 0508 10/29/19 1528 11/01/19 0500 11/02/19 0659 11/03/19 0252  NA  --    < > 140   < > 137 137 137  K  --    < > 3.8   < > 4.5 3.7 3.9  CL  --    < > 106   < > 103 102 102  CO2  --    < > 23   < > 24 24 24   GLUCOSE  --    < > 256*   < > 211* 286* 251*  BUN  --    < > 21*   < > 18 27* 29*  CREATININE  --    < > 1.08   < > 1.01 1.26* 1.16  CALCIUM  --    < > 8.9   < >  8.4* 8.3* 8.2*  PROT 6.4*  --  6.4*  --   --   --   --   ALBUMIN  --   --  3.1*  --   --   --   --   AST  --   --  29  --   --   --   --   ALT  --   --  29  --   --   --   --   ALKPHOS  --   --  120  --   --   --   --   BILITOT  --   --  1.6*  --   --   --   --   GFRNONAA  --    < > >60   < > >60 >60 >60  GFRAA  --    < > >60   < > >60 >60 >60  ANIONGAP  --    < > 11   < > 10 11 11    < > = values in this interval not displayed.     Hematology Recent Labs  Lab 11/01/19 0500 11/02/19 0659 11/03/19 0252  WBC 15.6* 11.8* 10.1  RBC 4.77 4.81 4.68  HGB 13.8 13.6 13.4  HCT 43.5 43.3 41.6  MCV 91.2 90.0 88.9  MCH 28.9 28.3 28.6  MCHC 31.7 31.4 32.2  RDW 15.7* 15.9* 15.9*  PLT 157 219 278    BNPNo results for input(s): BNP, PROBNP in the last 168 hours.   DDimer No results for input(s): DDIMER in the last 168 hours.   Radiology    DG Chest 1 View  Result Date: 11/03/2019 CLINICAL DATA:  Chest tube. EXAM: CHEST  1 VIEW COMPARISON:  11/02/2019 FINDINGS: 0522 hours. Low lung volumes. The cardio pericardial silhouette is enlarged. Mediastinal/pericardial drains remain in place. Right IJ sheath again noted. Right PICC line tip overlies the distal SVC level. Bilateral chest tubes remain in place. No  evidence of residual pneumothorax. Interval increase in right base atelectasis with probable small right pleural effusion. Persistent retrocardiac atelectasis with tiny left pleural effusion noted. The visualized bony structures of the thorax are intact. Telemetry leads overlie the chest. IMPRESSION: 1. Interval increase in right base atelectasis with probable small right pleural effusion. 2. Bilateral chest tubes remain in place without evidence for residual pneumothorax. Electronically Signed   By: Misty Stanley M.D.   On: 11/03/2019 08:08   DG CHEST PORT 1 VIEW  Result Date: 11/02/2019 CLINICAL DATA:  PICC placement EXAM: PORTABLE CHEST 1 VIEW COMPARISON:  11/02/2019 FINDINGS: Prior CABG. Right PICC line in place with the tip in the SVC. Right internal jugular central line tip in the SVC. Cardiomegaly. Stable left base atelectasis and small left effusion. Right lung clear. IMPRESSION: Right PICC line tip in the SVC. Cardiomegaly, left base atelectasis with small left effusion. Electronically Signed   By: Rolm Baptise M.D.   On: 11/02/2019 12:24   DG CHEST PORT 1 VIEW  Result Date: 11/02/2019 CLINICAL DATA:  Chest tube surveillance EXAM: PORTABLE CHEST 1 VIEW COMPARISON:  10/31/2019 FINDINGS: Interval removal of Swan-Ganz catheter. Right IJ sheath remains in place. Bilateral chest tubes and mediastinal drain appear stable in positioning. Median sternotomy. Stable cardiomegaly. Improving aeration of the lung fields bilaterally. No new focal airspace consolidation. No appreciable pleural fluid collection. No pneumothorax. IMPRESSION: 1. Improving aeration of the lung fields bilaterally. 2. No pneumothorax. 3. Interval removal of Swan-Ganz catheter. Electronically Signed  By: Davina Poke D.O.   On: 11/02/2019 08:37   [place] EKG SITE RITE  Result Date: 11/02/2019 If Site Rite image not attached, placement could not be confirmed due to current cardiac rhythm.   Cardiac Studies   Limited Echo  10/26/19 1. No formed LV mural thrombus noted with Definity contrast.   FINDINGS  Left Ventricle: No formed LV mural thrombus noted with Definity contrast.   Echo 10/25/19 1. Left ventricular ejection fraction, by estimation, is 30 to 35%. The  left ventricle has moderately decreased function. The left ventricle  demonstrates regional wall motion abnormalities (see scoring  diagram/findings for description). There is  moderate left ventricular hypertrophy. Left ventricular diastolic  parameters are consistent with Grade III diastolic dysfunction  (restrictive). Suggest Definity contrast study to more effectively exclude  apical thrombus - acoustic shadowing and false  tendon noted.  2. Right ventricular systolic function is mildly reduced. The right  ventricular size is normal. There is moderately elevated pulmonary artery  systolic pressure. The estimated right ventricular systolic pressure is  90.2 mmHg.  3. Left atrial size was mild to moderately dilated.  4. Right atrial size was mild to moderately dilated.  5. The mitral valve is abnormal, mildly thickened and with annular  calcification. Mild mitral valve regurgitation.  6. The tricuspid valve is abnormal.  7. The aortic valve is tricuspid. Aortic valve regurgitation is trivial.  8. The inferior vena cava is dilated in size with <50% respiratory  variability, suggesting right atrial pressure of 15 mmHg.   Patient Profile     KELLEY KNOTH a 59 y.o.malewith a hx of CAD, (acute MI 2005 with PTCA to Diag, then stent to LCX in 2007 , last cath 2008 with 20% LM, 50% LAD and 70%mLAD, ? Hx of EF 29% on myoview in 2013 but echo in 2013 with normal EF (60-65%), HTN, DM-2, HLD, + tobacco, chronic pain , PADwho was admitted with acute systolic CHF and chest pain/unstable angina  Assessment & Plan    1. CAD s/p CABG with LIMA to LAD and RIMA to PDA. He is on ASA and statin.   2. Acute combined CHF: Echo with  LVEF=30-35%. He has diuresed well, down 20 liters since admission on milrinone and IV Lasix. His milrinone is being weaned. I think his milrinone can be stopped soon. Continue IV Lasix today. Consider changing to po Lasix tomorrow.   3. Atrial fib: Continue amiodarone drip for rate control. Will add low dose metoprolol for better rate control.   For questions or updates, please contact [place] Please consult [email or web address] for contact info under        Signed, Lauree Chandler, MD  11/03/2019, 9:20 AM

## 2019-11-03 NOTE — Progress Notes (Signed)
CARDIAC REHAB PHASE I   PRE:  Rate/Rhythm: 90 afib    BP: sitting 107/96    SaO2: 94 RA  MODE:  Ambulation: 410 ft   POST:  Rate/Rhythm: 114 afib    BP: sitting 120/89     SaO2: 95 RA  Pt able to get out of bed with min assist and verbal cues. Stood and ambulated with min assist, RW. Steady, no major c/o. VSS. To recliner after walk. Practiced IS, 750 mL. Pt knows he needs x1 more walk this evening. [place], ACSM 11/03/2019 3:06 PM

## 2019-11-03 NOTE — Plan of Care (Signed)
  Problem: Education: Goal: Ability to demonstrate management of disease process will improve Outcome: Progressing Goal: Ability to verbalize understanding of medication therapies will improve Outcome: Progressing Goal: Individualized Educational Video(s) Outcome: Progressing   Problem: Activity: Goal: Capacity to carry out activities will improve Outcome: Progressing   Problem: Cardiac: Goal: Ability to achieve and maintain adequate cardiopulmonary perfusion will improve Outcome: Progressing   Problem: Education: Goal: Knowledge of General Education information will improve Description: Including pain rating scale, medication(s)/side effects and non-pharmacologic comfort measures Outcome: Progressing   Problem: Health Behavior/Discharge Planning: Goal: Ability to manage health-related needs will improve Outcome: Progressing   Problem: Clinical Measurements: Goal: Ability to maintain clinical measurements within normal limits will improve Outcome: Progressing Goal: Will remain free from infection Outcome: Progressing Goal: Diagnostic test results will improve Outcome: Progressing Goal: Respiratory complications will improve Outcome: Progressing Goal: Cardiovascular complication will be avoided Outcome: Progressing   Problem: Activity: Goal: Risk for activity intolerance will decrease Outcome: Progressing   Problem: Nutrition: Goal: Adequate nutrition will be maintained Outcome: Progressing   Problem: Coping: Goal: Level of anxiety will decrease Outcome: Progressing   Problem: Elimination: Goal: Will not experience complications related to bowel motility Outcome: Progressing Goal: Will not experience complications related to urinary retention Outcome: Progressing   Problem: Pain Managment: Goal: General experience of comfort will improve Outcome: Progressing   Problem: Safety: Goal: Ability to remain free from injury will improve Outcome: Progressing    Problem: Skin Integrity: Goal: Risk for impaired skin integrity will decrease Outcome: Progressing   Problem: Education: Goal: Will demonstrate proper wound care and an understanding of methods to prevent future damage Outcome: Progressing Goal: Knowledge of disease or condition will improve Outcome: Progressing Goal: Knowledge of the prescribed therapeutic regimen will improve Outcome: Progressing Goal: Individualized Educational Video(s) Outcome: Progressing   Problem: Activity: Goal: Risk for activity intolerance will decrease Outcome: Progressing   Problem: Cardiac: Goal: Will achieve and/or maintain hemodynamic stability Outcome: Progressing   Problem: Clinical Measurements: Goal: Postoperative complications will be avoided or minimized Outcome: Progressing   Problem: Respiratory: Goal: Respiratory status will improve Outcome: Progressing   Problem: Skin Integrity: Goal: Wound healing without signs and symptoms of infection Outcome: Progressing Goal: Risk for impaired skin integrity will decrease Outcome: Progressing   Problem: Urinary Elimination: Goal: Ability to achieve and maintain adequate renal perfusion and functioning will improve Outcome: Progressing

## 2019-11-04 ENCOUNTER — Inpatient Hospital Stay (HOSPITAL_COMMUNITY): Payer: Medicaid Other

## 2019-11-04 LAB — GLUCOSE, CAPILLARY
Glucose-Capillary: 196 mg/dL — ABNORMAL HIGH (ref 70–99)
Glucose-Capillary: 221 mg/dL — ABNORMAL HIGH (ref 70–99)
Glucose-Capillary: 129 mg/dL — ABNORMAL HIGH (ref 70–99)
Glucose-Capillary: 165 mg/dL — ABNORMAL HIGH (ref 70–99)

## 2019-11-04 LAB — CBC
HCT: 43 % (ref 39.0–52.0)
Hemoglobin: 13.7 g/dL (ref 13.0–17.0)
MCH: 28 pg (ref 26.0–34.0)
MCHC: 31.9 g/dL (ref 30.0–36.0)
MCV: 87.9 fL (ref 80.0–100.0)
Platelets: 304 10*3/uL (ref 150–400)
RBC: 4.89 MIL/uL (ref 4.22–5.81)
RDW: 15.9 % — ABNORMAL HIGH (ref 11.5–15.5)
WBC: 8.7 10*3/uL (ref 4.0–10.5)
nRBC: 0 % (ref 0.0–0.2)

## 2019-11-04 LAB — BASIC METABOLIC PANEL
Anion gap: 11 (ref 5–15)
BUN: 24 mg/dL — ABNORMAL HIGH (ref 6–20)
CO2: 25 mmol/L (ref 22–32)
Calcium: 8.2 mg/dL — ABNORMAL LOW (ref 8.9–10.3)
Chloride: 99 mmol/L (ref 98–111)
Creatinine, Ser: 0.91 mg/dL (ref 0.61–1.24)
GFR calc Af Amer: >60 mL/min (ref >60)
GFR calc non Af Amer: >60 mL/min (ref >60)
Glucose, Bld: 216 mg/dL — ABNORMAL HIGH (ref 70–99)
Potassium: 4 mmol/L (ref 3.5–5.1)
Sodium: 135 mmol/L (ref 135–145)

## 2019-11-04 MED ORDER — POTASSIUM CHLORIDE CRYS ER 20 MEQ PO TBCR
30.0000 meq | EXTENDED_RELEASE_TABLET | Freq: Once | ORAL | Status: AC
  Administered 2019-11-04: 30 meq via ORAL
  Filled 2019-11-04: qty 1

## 2019-11-04 MED ORDER — EMPAGLIFLOZIN 25 MG PO TABS
25.0000 mg | ORAL_TABLET | Freq: Every day | ORAL | Status: DC
  Administered 2019-11-04 – 2019-11-06 (×3): 25 mg via ORAL
  Filled 2019-11-04 (×3): qty 1

## 2019-11-04 MED ORDER — METFORMIN HCL 500 MG PO TABS
1000.0000 mg | ORAL_TABLET | Freq: Two times a day (BID) | ORAL | Status: DC
  Administered 2019-11-04 – 2019-11-06 (×5): 1000 mg via ORAL
  Filled 2019-11-04 (×5): qty 2

## 2019-11-04 MED ORDER — AMIODARONE HCL 200 MG PO TABS
400.0000 mg | ORAL_TABLET | Freq: Two times a day (BID) | ORAL | Status: DC
  Administered 2019-11-04 – 2019-11-06 (×5): 400 mg via ORAL
  Filled 2019-11-04 (×5): qty 2

## 2019-11-04 NOTE — Progress Notes (Signed)
CARDIAC REHAB PHASE I   PRE:  Rate/Rhythm: 91 afib    BP: sitting 142/96    SaO2: 92 RA  MODE:  Ambulation: 820 ft   POST:  Rate/Rhythm: 95 afib    BP: sitting 134/78     SaO2: 95 RA  Pt able to get up independently and walk with RW. Slightly unsteady, able to correct. Eager to increase distance although fatigued and SOB toward end. He does have RW at home. Return to bed as he does not like recliner. Encouraged IS and more walking.  [id number]   [place], ACSM 11/04/2019 10:49 AM

## 2019-11-04 NOTE — Progress Notes (Signed)
Progress Note  Patient Name: [name] Date of Encounter: 11/04/2019  [facility] Cardiologist: Kirk Ruths, MD   Subjective   Chest wall pain is better.   Inpatient Medications    Scheduled Meds: . amiodarone  400 mg Oral BID  . aspirin EC  81 mg Oral Daily  . atorvastatin  40 mg Oral Daily  . bisacodyl  10 mg Oral Daily   Or  . bisacodyl  10 mg Rectal Daily  . Chlorhexidine Gluconate Cloth  6 each Topical Daily  . clopidogrel  75 mg Oral Daily  . docusate sodium  200 mg Oral Daily  . empagliflozin  25 mg Oral Daily  . furosemide  60 mg Intravenous BID  . insulin aspart  0-20 Units Subcutaneous TID WC  . insulin aspart  0-5 Units Subcutaneous QHS  . mouth rinse  15 mL Mouth Rinse BID  . metFORMIN  1,000 mg Oral BID WC  . metoprolol tartrate  12.5 mg Oral BID  . pantoprazole  40 mg Oral Daily   Continuous Infusions: . milrinone 0.125 mcg/kg/min (11/04/19 0400)   PRN Meds: HYDROmorphone, levalbuterol, metoprolol tartrate, ondansetron (ZOFRAN) IV   Vital Signs    Vitals:   11/04/19 0300 11/04/19 0355 11/04/19 0500 11/04/19 0727  BP:  123/82  139/79  Pulse:  96  94  Resp: 20 18  20   Temp:  97.9 F (36.6 C)  97.9 F (36.6 C)  TempSrc:  Oral  Oral  SpO2:  99%  98%  Weight:   86.6 kg   Height:        Intake/Output Summary (Last 24 hours) at 11/04/2019 0843 Last data filed at 11/04/2019 0400 Gross per 24 hour  Intake 870.2 ml  Output 700 ml  Net 170.2 ml   Last 3 Weights 11/04/2019 11/03/2019 11/02/2019  Weight (lbs) 190 lb 14.7 oz 192 lb 0.3 oz 191 lb 12.8 oz  Weight (kg) 86.6 kg 87.1 kg 87 kg      Telemetry    Atrial fib- Personally Reviewed  ECG    No EKG today- Personally Reviewed  Physical Exam   General: Well developed, well nourished, NAD  HEENT: OP clear, mucus membranes moist  SKIN: warm, dry. No rashes. Neuro: No focal deficits  Musculoskeletal: Muscle strength 5/5 all ext  Psychiatric: Mood and affect normal  Neck: No  JVD Lungs:Clear bilaterally, no wheezes, rhonci, crackles Cardiovascular: Irreg irreg. No murmurs, gallops or rubs. Abdomen:Soft. Bowel sounds present. Non-tender.  Extremities: Trace bilateral lower extremity edema.      Labs    High Sensitivity Troponin:   Recent Labs  Lab 10/24/19 2011 10/24/19 2218  TROPONINIHS 17 19*      Chemistry Recent Labs  Lab 10/29/19 0508 10/29/19 1528 11/02/19 0659 11/03/19 0252 11/04/19 0423  NA 140   < > 137 137 135  K 3.8   < > 3.7 3.9 4.0  CL 106   < > 102 102 99  CO2 23   < > 24 24 25   GLUCOSE 256*   < > 286* 251* 216*  BUN 21*   < > 27* 29* 24*  CREATININE 1.08   < > 1.26* 1.16 0.91  CALCIUM 8.9   < > 8.3* 8.2* 8.2*  PROT 6.4*  --   --   --   --   ALBUMIN 3.1*  --   --   --   --   AST 29  --   --   --   --  ALT 29  --   --   --   --   ALKPHOS 120  --   --   --   --   BILITOT 1.6*  --   --   --   --   GFRNONAA >60   < > >60 >60 >60  GFRAA >60   < > >60 >60 >60  ANIONGAP 11   < > 11 11 11    < > = values in this interval not displayed.     Hematology Recent Labs  Lab 11/02/19 0659 11/03/19 0252 11/04/19 0423  WBC 11.8* 10.1 8.7  RBC 4.81 4.68 4.89  HGB 13.6 13.4 13.7  HCT 43.3 41.6 43.0  MCV 90.0 88.9 87.9  MCH 28.3 28.6 28.0  MCHC 31.4 32.2 31.9  RDW 15.9* 15.9* 15.9*  PLT 219 278 304    BNPNo results for input(s): BNP, PROBNP in the last 168 hours.   DDimer No results for input(s): DDIMER in the last 168 hours.   Radiology    DG Chest 1 View  Result Date: 11/03/2019 CLINICAL DATA:  Chest tube. EXAM: CHEST  1 VIEW COMPARISON:  11/02/2019 FINDINGS: 0522 hours. Low lung volumes. The cardio pericardial silhouette is enlarged. Mediastinal/pericardial drains remain in place. Right IJ sheath again noted. Right PICC line tip overlies the distal SVC level. Bilateral chest tubes remain in place. No evidence of residual pneumothorax. Interval increase in right base atelectasis with probable small right pleural effusion.  Persistent retrocardiac atelectasis with tiny left pleural effusion noted. The visualized bony structures of the thorax are intact. Telemetry leads overlie the chest. IMPRESSION: 1. Interval increase in right base atelectasis with probable small right pleural effusion. 2. Bilateral chest tubes remain in place without evidence for residual pneumothorax. Electronically Signed   By: Misty Stanley M.D.   On: 11/03/2019 08:08   DG Chest 2 View  Result Date: 11/04/2019 CLINICAL DATA:  Status post cardiac surgery. EXAM: CHEST - 2 VIEW COMPARISON:  November 03, 2010. FINDINGS: Stable cardiomegaly. Sternotomy wires are noted. No pneumothorax is noted. Right-sided PICC line is in good position. Bilateral chest tubes have been removed. Small bilateral pleural effusions are noted. Stable right basilar subsegmental atelectasis. Bony thorax is unremarkable. IMPRESSION: Bilateral chest tubes have been removed. Small bilateral pleural effusions are noted. Stable right basilar subsegmental atelectasis. Electronically Signed   By: Marijo Conception M.D.   On: 11/04/2019 08:12   DG CHEST PORT 1 VIEW  Result Date: 11/02/2019 CLINICAL DATA:  PICC placement EXAM: PORTABLE CHEST 1 VIEW COMPARISON:  11/02/2019 FINDINGS: Prior CABG. Right PICC line in place with the tip in the SVC. Right internal jugular central line tip in the SVC. Cardiomegaly. Stable left base atelectasis and small left effusion. Right lung clear. IMPRESSION: Right PICC line tip in the SVC. Cardiomegaly, left base atelectasis with small left effusion. Electronically Signed   By: Rolm Baptise M.D.   On: 11/02/2019 12:24    Cardiac Studies   Limited Echo 10/26/19 1. No formed LV mural thrombus noted with Definity contrast.   FINDINGS  Left Ventricle: No formed LV mural thrombus noted with Definity contrast.   Echo 10/25/19 1. Left ventricular ejection fraction, by estimation, is 30 to 35%. The  left ventricle has moderately decreased function. The left  ventricle  demonstrates regional wall motion abnormalities (see scoring  diagram/findings for description). There is  moderate left ventricular hypertrophy. Left ventricular diastolic  parameters are consistent with Grade III diastolic dysfunction  (restrictive).  Suggest Definity contrast study to more effectively exclude  apical thrombus - acoustic shadowing and false  tendon noted.  2. Right ventricular systolic function is mildly reduced. The right  ventricular size is normal. There is moderately elevated pulmonary artery  systolic pressure. The estimated right ventricular systolic pressure is  47.3 mmHg.  3. Left atrial size was mild to moderately dilated.  4. Right atrial size was mild to moderately dilated.  5. The mitral valve is abnormal, mildly thickened and with annular  calcification. Mild mitral valve regurgitation.  6. The tricuspid valve is abnormal.  7. The aortic valve is tricuspid. Aortic valve regurgitation is trivial.  8. The inferior vena cava is dilated in size with <50% respiratory  variability, suggesting right atrial pressure of 15 mmHg.   Patient Profile     [name] a 59 y.o.malewith a hx of CAD, (acute MI 2005 with PTCA to Diag, then stent to LCX in 2007 , last cath 2008 with 20% LM, 50% LAD and 70%mLAD, ? Hx of EF 29% on myoview in 2013 but echo in 2013 with normal EF (60-65%), HTN, DM-2, HLD, + tobacco, chronic pain , PADwho was admitted with acute systolic CHF and chest pain/unstable angina  Assessment & Plan    1. CAD s/p CABG with LIMA to LAD and RIMA to PDA. He is on ASA, beta blcoker and statin.   2. Acute combined CHF: Echo with LVEF=30-35%. He has diuresed well, down 20 liters since admission on milrinone and IV Lasix. I would stop his milrinone today. Maybe one more day of IV Lasix. Still with mild LE edema.    3. Atrial fib: Rate controlled. Continue amiodarone and metoprolol.  Will review need for short term  anti-coagulation with the surgery team.   For questions or updates, please contact [place] Please consult [email or web address] for contact info under        Signed, Lauree Chandler, MD  11/04/2019, 8:43 AM

## 2019-11-04 NOTE — Progress Notes (Addendum)
[address]       [place] [address]             [phone]      6 Days Post-Op Procedure(s) (LRB): CORONARY ARTERY BYPASS GRAFTING (CABG) using LIMA to LAD; RIMA to PDA; and Left radial harvest vein to Circ. (N/A) RADIAL ARTERY HARVEST (Left) TRANSESOPHAGEAL ECHOCARDIOGRAM (TEE) (N/A) Subjective: Feels ok, steady progress  Objective: Vital signs in last 24 hours: Temp:  [97.8 F (36.6 C)-98.4 F (36.9 C)] 97.9 F (36.6 C) (07/14 0727) Pulse Rate:  [87-105] 94 (07/14 0727) Cardiac Rhythm: Atrial fibrillation (07/13 2104) Resp:  [16-22] 20 (07/14 0727) BP: (107-177)/(63-105) 139/79 (07/14 0727) SpO2:  [92 %-99 %] 98 % (07/14 0727) Weight:  [86.6 kg] 86.6 kg (07/14 0500)  Hemodynamic parameters for last 24 hours:    Intake/Output from previous day: 07/13 0701 - 07/14 0700 In: 889.6 [P.O.:480; I.V.:409.6] Out: 700 [Urine:700] Intake/Output this shift: No intake/output data recorded.  General appearance: alert, cooperative and no distress Heart: irregularly irregular rhythm Lungs: mildly dim in bases Abdomen: benign Extremities: + edema Wound: incis healing well, left hand N/V intact  Lab Results: Recent Labs    11/03/19 0252 11/04/19 0423  WBC 10.1 8.7  HGB 13.4 13.7  HCT 41.6 43.0  PLT 278 304   BMET:  Recent Labs    11/03/19 0252 11/04/19 0423  NA 137 135  K 3.9 4.0  CL 102 99  CO2 24 25  GLUCOSE 251* 216*  BUN 29* 24*  CREATININE 1.16 0.91  CALCIUM 8.2* 8.2*    PT/INR: No results for input(s): LABPROT, INR in the last 72 hours. ABG    Component Value Date/Time   PHART 7.271 (L) 10/30/2019 0506   HCO3 22.2 10/30/2019 0506   TCO2 24 10/30/2019 0506   ACIDBASEDEF 5.0 (H) 10/30/2019 0506   O2SAT 58.3 11/03/2019 0314   CBG (last 3)  Recent Labs    11/03/19 1604 11/03/19 2058 11/04/19 0638  GLUCAP 191* 184* 196*    Meds Scheduled Meds: . aspirin EC  81 mg Oral Daily  . atorvastatin  40 mg Oral Daily  .  bisacodyl  10 mg Oral Daily   Or  . bisacodyl  10 mg Rectal Daily  . Chlorhexidine Gluconate Cloth  6 each Topical Daily  . clopidogrel  75 mg Oral Daily  . docusate sodium  200 mg Oral Daily  . furosemide  60 mg Intravenous BID  . insulin aspart  0-20 Units Subcutaneous TID WC  . insulin aspart  0-5 Units Subcutaneous QHS  . mouth rinse  15 mL Mouth Rinse BID  . metoprolol tartrate  12.5 mg Oral BID  . pantoprazole  40 mg Oral Daily   Continuous Infusions: . amiodarone 30 mg/hr (11/04/19 0400)  . milrinone 0.125 mcg/kg/min (11/04/19 0400)   PRN Meds:.HYDROmorphone, levalbuterol, metoprolol tartrate, ondansetron (ZOFRAN) IV  Xrays DG Chest 1 View  Result Date: 11/03/2019 CLINICAL DATA:  Chest tube. EXAM: CHEST  1 VIEW COMPARISON:  11/02/2019 FINDINGS: 0522 hours. Low lung volumes. The cardio pericardial silhouette is enlarged. Mediastinal/pericardial drains remain in place. Right IJ sheath again noted. Right PICC line tip overlies the distal SVC level. Bilateral chest tubes remain in place. No evidence of residual pneumothorax. Interval increase in right base atelectasis with probable small right pleural effusion. Persistent retrocardiac atelectasis with tiny left pleural effusion noted. The visualized bony structures of the thorax are intact. Telemetry leads overlie the chest. IMPRESSION:  1. Interval increase in right base atelectasis with probable small right pleural effusion. 2. Bilateral chest tubes remain in place without evidence for residual pneumothorax. Electronically Signed   By: Misty Stanley M.D.   On: 11/03/2019 08:08   DG CHEST PORT 1 VIEW  Result Date: 11/02/2019 CLINICAL DATA:  PICC placement EXAM: PORTABLE CHEST 1 VIEW COMPARISON:  11/02/2019 FINDINGS: Prior CABG. Right PICC line in place with the tip in the SVC. Right internal jugular central line tip in the SVC. Cardiomegaly. Stable left base atelectasis and small left effusion. Right lung clear. IMPRESSION: Right PICC  line tip in the SVC. Cardiomegaly, left base atelectasis with small left effusion. Electronically Signed   By: Rolm Baptise M.D.   On: 11/02/2019 12:24   DG CHEST PORT 1 VIEW  Result Date: 11/02/2019 CLINICAL DATA:  Chest tube surveillance EXAM: PORTABLE CHEST 1 VIEW COMPARISON:  10/31/2019 FINDINGS: Interval removal of Swan-Ganz catheter. Right IJ sheath remains in place. Bilateral chest tubes and mediastinal drain appear stable in positioning. Median sternotomy. Stable cardiomegaly. Improving aeration of the lung fields bilaterally. No new focal airspace consolidation. No appreciable pleural fluid collection. No pneumothorax. IMPRESSION: 1. Improving aeration of the lung fields bilaterally. 2. No pneumothorax. 3. Interval removal of Swan-Ganz catheter. Electronically Signed   By: Davina Poke D.O.   On: 11/02/2019 08:37   [place] EKG SITE RITE  Result Date: 11/02/2019 If Site Rite image not attached, placement could not be confirmed due to current cardiac rhythm.   Assessment/Plan: S/P Procedure(s) (LRB): CORONARY ARTERY BYPASS GRAFTING (CABG) using LIMA to LAD; RIMA to PDA; and Left radial harvest vein to Circ. (N/A) RADIAL ARTERY HARVEST (Left) TRANSESOPHAGEAL ECHOCARDIOGRAM (TEE) (N/A)  1 doing well 2 afib with CVR, will d/c gtt and start po 3 AHF team managing HFrEf, milrinone  4 not anemic 5 BS control is fair, will resume glucophage and jardiance 6 normal renal fxn 7 CXR small effus/atx- routine pulm toilet/diurese 8 Cardiac rehab 9 poss home 2-3 days  LOS: 10 days    John Giovanni PA-C Pager [phone] 11/04/2019   patient examined and medical record reviewed,agree with above note. Tharon Aquas Trigt III 11/05/2019

## 2019-11-04 NOTE — Plan of Care (Signed)

## 2019-11-05 LAB — GLUCOSE, CAPILLARY
Glucose-Capillary: 184 mg/dL — ABNORMAL HIGH (ref 70–99)
Glucose-Capillary: 211 mg/dL — ABNORMAL HIGH (ref 70–99)
Glucose-Capillary: 120 mg/dL — ABNORMAL HIGH (ref 70–99)
Glucose-Capillary: 190 mg/dL — ABNORMAL HIGH (ref 70–99)

## 2019-11-05 MED ORDER — APIXABAN 2.5 MG PO TABS
2.5000 mg | ORAL_TABLET | Freq: Two times a day (BID) | ORAL | Status: DC
  Administered 2019-11-05 – 2019-11-06 (×2): 2.5 mg via ORAL
  Filled 2019-11-05 (×3): qty 1

## 2019-11-05 NOTE — Progress Notes (Addendum)
[address]       [place] [address]             [phone]      7 Days Post-Op Procedure(s) (LRB): CORONARY ARTERY BYPASS GRAFTING (CABG) using LIMA to LAD; RIMA to PDA; and Left radial harvest vein to Circ. (N/A) RADIAL ARTERY HARVEST (Left) TRANSESOPHAGEAL ECHOCARDIOGRAM (TEE) (N/A) Subjective: conts to feel better, no specific c/o  Objective: Vital signs in last 24 hours: Temp:  [97.7 F (36.5 C)-98.6 F (37 C)] 98.6 F (37 C) (07/15 0442) Pulse Rate:  [83-105] 96 (07/15 0442) Cardiac Rhythm: Atrial fibrillation (07/14 1905) Resp:  [15-20] 20 (07/15 0627) BP: (114-134)/(78-95) 130/88 (07/15 0442) SpO2:  [94 %-98 %] 94 % (07/15 0442) Weight:  [83.6 kg] 83.6 kg (07/15 0500)  Hemodynamic parameters for last 24 hours:    Intake/Output from previous day: 07/14 0701 - 07/15 0700 In: 927.5 [P.O.:855; I.V.:72.5] Out: 2425 [Urine:2425] Intake/Output this shift: No intake/output data recorded.  General appearance: alert, cooperative and no distress Heart: irregularly irregular rhythm Lungs: clear to auscultation bilaterally Abdomen: benign Extremities: + edema Wound: incis healing well, left hand N/V intact  Lab Results: Recent Labs    11/03/19 0252 11/04/19 0423  WBC 10.1 8.7  HGB 13.4 13.7  HCT 41.6 43.0  PLT 278 304   BMET:  Recent Labs    11/03/19 0252 11/04/19 0423  NA 137 135  K 3.9 4.0  CL 102 99  CO2 24 25  GLUCOSE 251* 216*  BUN 29* 24*  CREATININE 1.16 0.91  CALCIUM 8.2* 8.2*    PT/INR: No results for input(s): LABPROT, INR in the last 72 hours. ABG    Component Value Date/Time   PHART 7.271 (L) 10/30/2019 0506   HCO3 22.2 10/30/2019 0506   TCO2 24 10/30/2019 0506   ACIDBASEDEF 5.0 (H) 10/30/2019 0506   O2SAT 58.3 11/03/2019 0314   CBG (last 3)  Recent Labs    11/04/19 1602 11/04/19 2110 11/05/19 0613  GLUCAP 129* 165* 184*    Meds Scheduled Meds: . amiodarone  400 mg Oral BID  . aspirin EC  81 mg  Oral Daily  . atorvastatin  40 mg Oral Daily  . bisacodyl  10 mg Oral Daily   Or  . bisacodyl  10 mg Rectal Daily  . Chlorhexidine Gluconate Cloth  6 each Topical Daily  . clopidogrel  75 mg Oral Daily  . docusate sodium  200 mg Oral Daily  . empagliflozin  25 mg Oral Daily  . furosemide  60 mg Intravenous BID  . insulin aspart  0-20 Units Subcutaneous TID WC  . insulin aspart  0-5 Units Subcutaneous QHS  . mouth rinse  15 mL Mouth Rinse BID  . metFORMIN  1,000 mg Oral BID WC  . metoprolol tartrate  12.5 mg Oral BID  . pantoprazole  40 mg Oral Daily   Continuous Infusions: . milrinone 0.125 mcg/kg/min (11/05/19 0400)   PRN Meds:.HYDROmorphone, levalbuterol, metoprolol tartrate, ondansetron (ZOFRAN) IV  Xrays DG Chest 2 View  Result Date: 11/04/2019 CLINICAL DATA:  Status post cardiac surgery. EXAM: CHEST - 2 VIEW COMPARISON:  November 03, 2010. FINDINGS: Stable cardiomegaly. Sternotomy wires are noted. No pneumothorax is noted. Right-sided PICC line is in good position. Bilateral chest tubes have been removed. Small bilateral pleural effusions are noted. Stable right basilar subsegmental atelectasis. Bony thorax is unremarkable. IMPRESSION: Bilateral chest tubes have been removed. Small bilateral pleural effusions are noted. Stable  right basilar subsegmental atelectasis. Electronically Signed   By: Marijo Conception M.D.   On: 11/04/2019 08:12    Assessment/Plan: S/P Procedure(s) (LRB): CORONARY ARTERY BYPASS GRAFTING (CABG) using LIMA to LAD; RIMA to PDA; and Left radial harvest vein to Circ. (N/A) RADIAL ARTERY HARVEST (Left) TRANSESOPHAGEAL ECHOCARDIOGRAM (TEE) (N/A)  1 conts with steady progress 2 afib with CVR, will need to consider ACRX, curr.ent on baby asa , plavix, amio, beta blocker 3 co-ox 59 yesterday, AHF team is managing milrinone wean 4 no leukocytosis or fevers 5 not anemic 6 BS control improving on home meds 7 renal fxn is normal 8 poss home in am  Addendum- will  stop milrinone today sats low with ambulation- cont diyresis and IS/cardiac rehab    LOS: 11 days    John Giovanni PA-C Pager [phone] 11/05/2019  Cont diuresis, check CXR in am Start Eliquis for recurrent postop afib,  Stop asa and cont plavix  patient examined and medical record reviewed,agree with above note. Tharon Aquas Trigt III 11/05/2019

## 2019-11-05 NOTE — TOC Progression Note (Signed)
Transition of Care [facility]) - Progression Note    Patient Details  Name: SHAFIQ LARCH MRN: [id number] Date of Birth: Apr 06, 1961  [facility] CM/SW Contact  Zenon Mayo, RN Phone Number: 11/05/2019, 4:43 PM  Clinical Narrative:    NCM spoke with patient, filled out eht [employer] free trail 30 day free coupon and gave to [facility] to fill for patient when he is ready for it.          Expected Discharge Plan and Services                                                 Social Determinants of Health (SDOH) Interventions Physical Activity Interventions: Intervention Not Indicated Social Connections Interventions: Intervention Not Indicated  Readmission Risk Interventions No flowsheet data found.

## 2019-11-05 NOTE — Plan of Care (Signed)
  Problem: Education: Goal: Ability to demonstrate management of disease process will improve Outcome: Progressing Goal: Ability to verbalize understanding of medication therapies will improve Outcome: Progressing Goal: Individualized Educational Video(s) Outcome: Progressing   Problem: Activity: Goal: Capacity to carry out activities will improve Outcome: Progressing   Problem: Cardiac: Goal: Ability to achieve and maintain adequate cardiopulmonary perfusion will improve Outcome: Progressing   Problem: Education: Goal: Knowledge of General Education information will improve Description: Including pain rating scale, medication(s)/side effects and non-pharmacologic comfort measures Outcome: Progressing   Problem: Health Behavior/Discharge Planning: Goal: Ability to manage health-related needs will improve Outcome: Progressing   Problem: Clinical Measurements: Goal: Ability to maintain clinical measurements within normal limits will improve Outcome: Progressing Goal: Will remain free from infection Outcome: Progressing Goal: Diagnostic test results will improve Outcome: Progressing Goal: Respiratory complications will improve Outcome: Progressing Goal: Cardiovascular complication will be avoided Outcome: Progressing   Problem: Activity: Goal: Risk for activity intolerance will decrease Outcome: Progressing   Problem: Nutrition: Goal: Adequate nutrition will be maintained Outcome: Progressing   Problem: Coping: Goal: Level of anxiety will decrease Outcome: Progressing   Problem: Elimination: Goal: Will not experience complications related to bowel motility Outcome: Progressing Goal: Will not experience complications related to urinary retention Outcome: Progressing   Problem: Safety: Goal: Ability to remain free from injury will improve Outcome: Progressing   Problem: Skin Integrity: Goal: Risk for impaired skin integrity will decrease Outcome: Progressing    

## 2019-11-05 NOTE — Progress Notes (Signed)
CARDIAC REHAB PHASE I   PRE:  Rate/Rhythm: 101 afib    BP: sitting 162/64    SaO2: 86 RA, up to 92 RA sitting up  MODE:  Ambulation: 410 ft   POST:  Rate/Rhythm: 106 afib    BP: sitting 133/83     SaO2: 86-91 RA  Pt SaO2 low in bed. Able to come up with deep breaths and sitting up. Ambulated without RW today, min assist. Has RW and cane at home. Decreased distance, SaO2 low again after walk, 86 RA. Increased with deep breathing. Pt practiced IS, 750 mL x3 then 500 x2. Return to bed, put pt on 2L in bed. Encouraged him to sit up more today and practice IS. Sts he will later.  [id number]   [place], ACSM 11/05/2019 10:57 AM

## 2019-11-06 ENCOUNTER — Inpatient Hospital Stay (HOSPITAL_COMMUNITY): Payer: Medicaid Other

## 2019-11-06 LAB — BASIC METABOLIC PANEL
Anion gap: 11 (ref 5–15)
BUN: 22 mg/dL — ABNORMAL HIGH (ref 6–20)
CO2: 30 mmol/L (ref 22–32)
Calcium: 8.7 mg/dL — ABNORMAL LOW (ref 8.9–10.3)
Chloride: 96 mmol/L — ABNORMAL LOW (ref 98–111)
Creatinine, Ser: 1.19 mg/dL (ref 0.61–1.24)
GFR calc Af Amer: >60 mL/min (ref >60)
GFR calc non Af Amer: >60 mL/min (ref >60)
Glucose, Bld: 186 mg/dL — ABNORMAL HIGH (ref 70–99)
Potassium: 3.8 mmol/L (ref 3.5–5.1)
Sodium: 137 mmol/L (ref 135–145)

## 2019-11-06 LAB — CBC
HCT: 44 % (ref 39.0–52.0)
Hemoglobin: 14 g/dL (ref 13.0–17.0)
MCH: 28.1 pg (ref 26.0–34.0)
MCHC: 31.8 g/dL (ref 30.0–36.0)
MCV: 88.2 fL (ref 80.0–100.0)
Platelets: 439 10*3/uL — ABNORMAL HIGH (ref 150–400)
RBC: 4.99 MIL/uL (ref 4.22–5.81)
RDW: 15.8 % — ABNORMAL HIGH (ref 11.5–15.5)
WBC: 11.3 10*3/uL — ABNORMAL HIGH (ref 4.0–10.5)
nRBC: 0 % (ref 0.0–0.2)

## 2019-11-06 LAB — GLUCOSE, CAPILLARY
Glucose-Capillary: 176 mg/dL — ABNORMAL HIGH (ref 70–99)
Glucose-Capillary: 189 mg/dL — ABNORMAL HIGH (ref 70–99)

## 2019-11-06 MED ORDER — CLOPIDOGREL BISULFATE 75 MG PO TABS: 75.0000 mg | ORAL_TABLET | Freq: Every day | ORAL | 1 refills | Status: DC

## 2019-11-06 MED ORDER — APIXABAN 5 MG PO TABS: 5.0000 mg | ORAL_TABLET | Freq: Two times a day (BID) | ORAL | 1 refills | Status: DC

## 2019-11-06 MED ORDER — METOPROLOL TARTRATE 25 MG PO TABS: 25.0000 mg | ORAL_TABLET | Freq: Two times a day (BID) | ORAL | 1 refills | Status: DC

## 2019-11-06 MED ORDER — HYDROMORPHONE HCL 2 MG PO TABS: 1.0000 mg | ORAL_TABLET | Freq: Four times a day (QID) | ORAL | 0 refills | Status: DC | PRN

## 2019-11-06 MED ORDER — ATORVASTATIN CALCIUM 40 MG PO TABS: 40.0000 mg | ORAL_TABLET | Freq: Every day | ORAL | 1 refills | Status: DC

## 2019-11-06 MED ORDER — AMIODARONE HCL 400 MG PO TABS: ORAL_TABLET | ORAL | 0 refills | Status: DC

## 2019-11-06 MED ORDER — APIXABAN 5 MG PO TABS: 5.0000 mg | ORAL_TABLET | Freq: Two times a day (BID) | ORAL | Status: DC

## 2019-11-06 MED ORDER — FUROSEMIDE 40 MG PO TABS
40.0000 mg | ORAL_TABLET | Freq: Every day | ORAL | 1 refills | Status: DC
Start: 2019-11-06 — End: 2020-06-06

## 2019-11-06 MED FILL — ELIQUIS 5 MG TABLET: 5 | 30 days supply | Qty: 60 | Fill #0

## 2019-11-06 NOTE — Progress Notes (Signed)
[id number]       [place] [address]             [phone]      8 Days Post-Op Procedure(s) (LRB): CORONARY ARTERY BYPASS GRAFTING (CABG) using LIMA to LAD; RIMA to PDA; and Left radial harvest vein to Circ. (N/A) RADIAL ARTERY HARVEST (Left) TRANSESOPHAGEAL ECHOCARDIOGRAM (TEE) (N/A) Subjective: desats with ambulation , back on 2 liters Feels well  Objective: Vital signs in last 24 hours: Temp:  [98.2 F (36.8 C)-99.4 F (37.4 C)] 98.3 F (36.8 C) (07/16 0418) Pulse Rate:  [77-99] 86 (07/16 0418) Cardiac Rhythm: Atrial fibrillation (07/15 1930) Resp:  [14-20] 18 (07/16 0418) BP: (108-166)/(64-98) 108/98 (07/16 0418) SpO2:  [94 %-98 %] 96 % (07/16 0418) Weight:  [81.8 kg] 81.8 kg (07/16 0430)  Hemodynamic parameters for last 24 hours:    Intake/Output from previous day: 07/15 0701 - 07/16 0700 In: 1189.7 [P.O.:1070; I.V.:119.7] Out: 3065 [Urine:3065] Intake/Output this shift: No intake/output data recorded.  General appearance: alert, cooperative and no distress Heart: irregularly irregular rhythm Lungs: minor basilar crackles Abdomen: benign Extremities: + LE edema Wound: incis healing well  Lab Results: Recent Labs    11/04/19 0423 11/06/19 0424  WBC 8.7 11.3*  HGB 13.7 14.0  HCT 43.0 44.0  PLT 304 439*   BMET:  Recent Labs    11/04/19 0423 11/06/19 0424  NA 135 137  K 4.0 3.8  CL 99 96*  CO2 25 30  GLUCOSE 216* 186*  BUN 24* 22*  CREATININE 0.91 1.19  CALCIUM 8.2* 8.7*    PT/INR: No results for input(s): LABPROT, INR in the last 72 hours. ABG    Component Value Date/Time   PHART 7.271 (L) 10/30/2019 0506   HCO3 22.2 10/30/2019 0506   TCO2 24 10/30/2019 0506   ACIDBASEDEF 5.0 (H) 10/30/2019 0506   O2SAT 58.3 11/03/2019 0314   CBG (last 3)  Recent Labs    11/05/19 1647 11/05/19 2107 11/06/19 0626  GLUCAP 120* 190* 176*    Meds Scheduled Meds: . amiodarone  400 mg Oral BID  . apixaban  2.5 mg Oral  BID  . atorvastatin  40 mg Oral Daily  . bisacodyl  10 mg Oral Daily   Or  . bisacodyl  10 mg Rectal Daily  . Chlorhexidine Gluconate Cloth  6 each Topical Daily  . clopidogrel  75 mg Oral Daily  . docusate sodium  200 mg Oral Daily  . empagliflozin  25 mg Oral Daily  . furosemide  60 mg Intravenous BID  . insulin aspart  0-20 Units Subcutaneous TID WC  . insulin aspart  0-5 Units Subcutaneous QHS  . mouth rinse  15 mL Mouth Rinse BID  . metFORMIN  1,000 mg Oral BID WC  . metoprolol tartrate  12.5 mg Oral BID  . pantoprazole  40 mg Oral Daily   Continuous Infusions: PRN Meds:.HYDROmorphone, levalbuterol, metoprolol tartrate, ondansetron (ZOFRAN) IV  Xrays DG Chest 2 View  Result Date: 11/06/2019 CLINICAL DATA:  History of CABG EXAM: CHEST - 2 VIEW COMPARISON:  Two days ago FINDINGS: Cardiomegaly. Median sternotomy with CABG history. Right PICC with tip at the SVC. Small pleural effusions. Mild atelectatic type opacity. No pulmonary edema or pneumothorax. IMPRESSION: Cardiomegaly with small pleural effusions. Electronically Signed   By: Monte Fantasia M.D.   On: 11/06/2019 07:04    Assessment/Plan: S/P Procedure(s) (LRB): CORONARY ARTERY BYPASS GRAFTING (CABG) using LIMA to LAD; RIMA to  PDA; and Left radial harvest vein to Circ. (N/A) RADIAL ARTERY HARVEST (Left) TRANSESOPHAGEAL ECHOCARDIOGRAM (TEE) (N/A)   1 doing well 2 have asked cardiology to finalize home med recs 3 will see how he does with ambulation, poss need for home O2 4 BS control reasonable for in hospital but will need close management as outpatient 5 tmax 99.4, minor leukocytosis- no obvious clinical issues, wound problems 6 CXR no infilts, minor effus, cardiomegally 7 poss home soon   LOS: 12 days    Lee Giovanni PA-C Pager [phone] 11/06/2019

## 2019-11-06 NOTE — Progress Notes (Addendum)
Progress Note  Patient Name: [name] Date of Encounter: 11/06/2019  [facility] Cardiologist: Kirk Ruths, MD   Subjective   No complaints this am.   Inpatient Medications    Scheduled Meds: . amiodarone  400 mg Oral BID  . apixaban  2.5 mg Oral BID  . atorvastatin  40 mg Oral Daily  . bisacodyl  10 mg Oral Daily   Or  . bisacodyl  10 mg Rectal Daily  . Chlorhexidine Gluconate Cloth  6 each Topical Daily  . clopidogrel  75 mg Oral Daily  . docusate sodium  200 mg Oral Daily  . empagliflozin  25 mg Oral Daily  . furosemide  60 mg Intravenous BID  . insulin aspart  0-20 Units Subcutaneous TID WC  . insulin aspart  0-5 Units Subcutaneous QHS  . mouth rinse  15 mL Mouth Rinse BID  . metFORMIN  1,000 mg Oral BID WC  . metoprolol tartrate  12.5 mg Oral BID  . pantoprazole  40 mg Oral Daily   Continuous Infusions:  PRN Meds: HYDROmorphone, levalbuterol, metoprolol tartrate, ondansetron (ZOFRAN) IV   Vital Signs    Vitals:   11/06/19 0005 11/06/19 0418 11/06/19 0430 11/06/19 0823  BP: (!) 166/77 (!) 108/98  119/86  Pulse: 77 86    Resp: 14 18    Temp: 98.7 F (37.1 C) 98.3 F (36.8 C)  98 F (36.7 C)  TempSrc: Oral Oral  Oral  SpO2: 96% 96%  95%  Weight:   81.8 kg   Height:        Intake/Output Summary (Last 24 hours) at 11/06/2019 0828 Last data filed at 11/06/2019 0400 Gross per 24 hour  Intake 949.71 ml  Output 2315 ml  Net -1365.29 ml   Last 3 Weights 11/06/2019 11/05/2019 11/04/2019  Weight (lbs) 180 lb 4.8 oz 184 lb 4.9 oz 190 lb 14.7 oz  Weight (kg) 81.784 kg 83.6 kg 86.6 kg      Telemetry    Atrial fib, rates 80-90 bpm Personally Reviewed  ECG    No EKG today- Personally Reviewed  Physical Exam   General: Well developed, well nourished, NAD  HEENT: OP clear, mucus membranes moist  SKIN: warm, dry. No rashes. Neuro: No focal deficits  Musculoskeletal: Muscle strength 5/5 all ext  Psychiatric: Mood and affect normal  Neck:  No JVD,  Lungs:Clear bilaterally, no wheezes, rhonci, crackles Cardiovascular: irreg irreg. No murmurs, gallops or rubs. Abdomen:Soft. Bowel sounds present. Non-tender.  Extremities: No lower extremity edema.   Labs    High Sensitivity Troponin:   Recent Labs  Lab 10/24/19 2011 10/24/19 2218  TROPONINIHS 17 19*      Chemistry Recent Labs  Lab 11/03/19 0252 11/04/19 0423 11/06/19 0424  NA 137 135 137  K 3.9 4.0 3.8  CL 102 99 96*  CO2 24 25 30   GLUCOSE 251* 216* 186*  BUN 29* 24* 22*  CREATININE 1.16 0.91 1.19  CALCIUM 8.2* 8.2* 8.7*  GFRNONAA >60 >60 >60  GFRAA >60 >60 >60  ANIONGAP 11 11 11      Hematology Recent Labs  Lab 11/03/19 0252 11/04/19 0423 11/06/19 0424  WBC 10.1 8.7 11.3*  RBC 4.68 4.89 4.99  HGB 13.4 13.7 14.0  HCT 41.6 43.0 44.0  MCV 88.9 87.9 88.2  MCH 28.6 28.0 28.1  MCHC 32.2 31.9 31.8  RDW 15.9* 15.9* 15.8*  PLT 278 304 439*    BNPNo results for input(s): BNP, PROBNP in the last 168  hours.   DDimer No results for input(s): DDIMER in the last 168 hours.   Radiology    DG Chest 2 View  Result Date: 11/06/2019 CLINICAL DATA:  History of CABG EXAM: CHEST - 2 VIEW COMPARISON:  Two days ago FINDINGS: Cardiomegaly. Median sternotomy with CABG history. Right PICC with tip at the SVC. Small pleural effusions. Mild atelectatic type opacity. No pulmonary edema or pneumothorax. IMPRESSION: Cardiomegaly with small pleural effusions. Electronically Signed   By: Monte Fantasia M.D.   On: 11/06/2019 07:04    Cardiac Studies   Limited Echo 10/26/19 1. No formed LV mural thrombus noted with Definity contrast.   FINDINGS  Left Ventricle: No formed LV mural thrombus noted with Definity contrast.   Echo 10/25/19 1. Left ventricular ejection fraction, by estimation, is 30 to 35%. The  left ventricle has moderately decreased function. The left ventricle  demonstrates regional wall motion abnormalities (see scoring  diagram/findings for  description). There is  moderate left ventricular hypertrophy. Left ventricular diastolic  parameters are consistent with Grade III diastolic dysfunction  (restrictive). Suggest Definity contrast study to more effectively exclude  apical thrombus - acoustic shadowing and false  tendon noted.  2. Right ventricular systolic function is mildly reduced. The right  ventricular size is normal. There is moderately elevated pulmonary artery  systolic pressure. The estimated right ventricular systolic pressure is  25.8 mmHg.  3. Left atrial size was mild to moderately dilated.  4. Right atrial size was mild to moderately dilated.  5. The mitral valve is abnormal, mildly thickened and with annular  calcification. Mild mitral valve regurgitation.  6. The tricuspid valve is abnormal.  7. The aortic valve is tricuspid. Aortic valve regurgitation is trivial.  8. The inferior vena cava is dilated in size with <50% respiratory  variability, suggesting right atrial pressure of 15 mmHg.   Patient Profile     [name] a 59 y.o.malewith a hx of CAD, (acute MI 2005 with PTCA to Diag, then stent to LCX in 2007 , last cath 2008 with 20% LM, 50% LAD and 70%mLAD, ? Hx of EF 29% on myoview in 2013 but echo in 2013 with normal EF (60-65%), HTN, DM-2, HLD, + tobacco, chronic pain , PADwho was admitted with acute systolic CHF and chest pain/unstable angina  Assessment & Plan    1. CAD s/p CABG with LIMA to LAD and RIMA to PDA. NO chest pain.  -Would continue Plavix 75 mg daily -Increase metoprolol to 25 mg po BID -Continue statin  2. Acute combined CHF: Echo with LVEF=30-35%. He has diuresed well, down 23 liters since admission.  -I would stop the IV Lasix.  -Start Lasix 40 mg daily   3. Atrial fib: Rate is controlled. -Increase Eliquis to 5 mg po BID -Continue metoprolol but increase to 25 mg po BID -Continue amiodarone 400 mg po BID for total two weeks then reduce to 200 mg po BID  for two weeks. I would anticipate that he may convert to sinus in the next few weeks and his amiodarone can be stopped at follow up.   WE will arrange office f/u with DR. Crenshaw or care team APP  For questions or updates, please contact [place] Please consult [email or web address] for contact info under        Signed, Lauree Chandler, MD  11/06/2019, 8:28 AM

## 2019-11-06 NOTE — Progress Notes (Signed)
Pt refuses o2 at this time states the Dr stated that he can address that at  follow up if he thinks he needs it.Pt states he only has that issue at nights and uses a cpap.

## 2019-11-06 NOTE — TOC Transition Note (Signed)
Transition of Care [facility]) - CM/SW Discharge Note   Patient Details  Name: [name] MRN: [id number] Date of Birth: Jan 25, 1961  Transition of Care [facility]) CM/SW Contact:  Zenon Mayo, RN Phone Number: 11/06/2019, 3:43 PM   Clinical Narrative:    Patient for dc today, NCM contacted RN to see if we need to order oxygen for this patient.  There is an oxygen order in.  NCM made referral to [facility] with [employer] for home oxygen.    Final next level of care: Home/Self Care Barriers to Discharge: No Barriers Identified   Patient Goals and CMS Choice        Discharge Placement                       Discharge Plan and Services                DME Arranged: Oxygen DME Agency: [employer] Date DME Agency Contacted: 11/06/19 Time DME Agency Contacted: [phone] Representative spoke with at DME Agency: Thedore Mins            Social Determinants of Health ([place]) Interventions Physical Activity Interventions: Intervention Not Indicated Social Connections Interventions: Intervention Not Indicated   Readmission Risk Interventions No flowsheet data found.

## 2019-11-06 NOTE — Progress Notes (Signed)
Pt ambulated without O2, SaO2 maintained 89-90 RA. He sts he has CPAP for sleeping. Discussed sternal precautions, IS, diet, weighing daily, exercise, smoking cessation, and CRPII. Pt voiced understanding and will refer to [place]. Pt is very motivated to quit smoking. Gave HF booklet.  [place], ACSM 3:09 PM 11/06/2019

## 2019-11-06 NOTE — Progress Notes (Signed)
Pt left for home taken in wheelchair by nurse tech and accompanied by wife.

## 2019-11-09 LAB — ECHO INTRAOPERATIVE TEE
Height: 67 in
S' Lateral: 4.8 cm
Weight: 2888.91 oz

## 2019-11-10 NOTE — Op Note (Signed)
CARDIOTHORACIC SURGERY OPERATIVE NOTE  Date of Procedure: 10/29/19 Preoperative Diagnosis: Severe 3-vessel Coronary Artery Disease with reduced LV function and clinical heart failure  Postoperative Diagnosis: Same  Procedure:    Coronary Artery Bypass Grafting x 3   Left Internal Mammary Artery to Distal Left Anterior Descending Coronary Artery, pedicled RIMA Graft to Posterior Descending Coronary Artery; left radial artery Graft to Obtuse Marginal Branch of Left Circumflex Coronary Artery Open left radial artery Harvest  Bilateral internal mammary artery harvesting  Surgeon: B. Murvin Natal, MD  Assistant: Ahmed Prima, E PA-C  Anesthesia: get  Operative Findings:  Reduced left ventricular systolic function  good quality  internal mammary artery conduits  Good quality left radial artery conduit  Fair quality target vessels for grafting    BRIEF CLINICAL NOTE AND INDICATIONS FOR SURGERY  59 yo man with long-standing, known, severe CAD s/p PCI presented with new-onset CHF and hypervolemia. He was aggressively diuresed and underwent LHC which showed severe recurrent CAD. He is referred for CABG.    DETAILS OF THE OPERATIVE PROCEDURE  Preparation:  The patient is brought to the operating room on the above mentioned date and central monitoring was established by the anesthesia team including placement of Swan-Ganz catheter and radial arterial line. The patient is placed in the supine position on the operating table.  Intravenous antibiotics are administered. General endotracheal anesthesia is induced uneventfully. A Foley catheter is placed.  Baseline transesophageal echocardiogram was performed.  Findings were notable for reduced LV systolic function  The patient's chest, abdomen, left upper extremity, both groins, and both lower extremities are prepared and draped in a sterile manner. A time out procedure is performed.   Surgical Approach and Conduit Harvest:  A median  sternotomy incision was performed and the left internal mammary artery is dissected from the chest wall and prepared for bypass grafting. The left internal mammary artery is notably good quality conduit. Simultaneously, open left radial artery harvesting is performed.  After removal of the radial artery, the  incisions in the upper extremity are closed with absorbable suture, and the arm is tucked at the left side.  Next attention is turned to the right hemithorax of the right internal mammary artery is pedicle mobilized in standard fashion.  Prior to dividing the pedicle distally full dose heparin is given intravenously.  Following systemic heparinization, the internal mammary arteries were transected distally noted to have excellent flow.  Both were treated with a solution of papaverine as was the radial artery graft.  Extracorporeal Cardiopulmonary Bypass and Myocardial Protection:  The pericardium is opened. The ascending aorta is nondiseased in appearance. The ascending aorta and the right atrium are cannulated for cardiopulmonary bypass.  Adequate heparinization is verified.   A retrograde cardioplegia cannula is placed through the right atrium into the coronary sinus.   Cardiopulmonary bypass was begun and the surface of the heart is inspected. Distal target vessels are selected for coronary artery bypass grafting. A cardioplegia cannula is placed in the ascending aorta.   The patient is allowed to cool passively to 34C systemic temperature.  The aortic cross clamp is applied and cold blood cardioplegia is delivered initially in an antegrade fashion through the aortic root. Supplemental cardioplegia is given retrograde through the coronary sinus catheter.  Iced saline slush is applied for topical hypothermia.  The initial cardioplegic arrest is rapid with early diastolic arrest.  Repeat doses of cardioplegia are administered intermittently throughout the entire cross clamp portion of the operation  through the aortic  root,  through the coronary sinus catheter, and through subsequently placed  grafts in order to maintain completely flat electrocardiogram.   Coronary Artery Bypass Grafting:   The  posterior descending branch of the right coronary artery was grafted using the pedicled RIMA  graft in an end-to-side fashion.  At the site of distal anastomosis the target vessel was good quality and measured approximately 1.5 mm in diameter. Anastomotic patency and runoff was confirmed with indocyanine green fluorescence imaging (SPY).  The obtuse marginal branch of the left circumflex coronary artery was grafted using the left radial artery graft in an end-to-side fashion.  At the site of distal anastomosis the target vessel was good quality and measured approximately 1.5 mm in diameter.  The distal left anterior coronary artery was grafted with the left internal mammary artery in an end-to-side fashion.  At the site of distal anastomosis the target vessel was good quality and measured approximately 1.5 mm in diameter.Anastomotic patency and runoff was confirmed with indocyanine green fluorescence imaging (SPY).  The proximal radial anastomosis was placed directly to the ascending aorta prior to removal of the aortic cross clamp.  Deairing procedures were performed, and the aortic cross clamp was removed.    Procedure Completion:  All proximal and distal coronary anastomoses were inspected for hemostasis and appropriate graft orientation. Epicardial pacing wires are fixed to the right ventricular outflow tract and to the right atrial appendage. The patient is rewarmed to 37C temperature. The patient is weaned and disconnected from cardiopulmonary bypass.  The patient's rhythm at separation from bypass was NSR.  The patient was weaned from cardiopulmonary bypass with moderateinotropic support.  Followup transesophageal echocardiogram performed after separation from bypass revealed no changes from  the preoperative exam.  The aortic and venous cannula were removed uneventfully. Protamine was administered to reverse the anticoagulation. The mediastinum and pleural space were inspected for hemostasis and irrigated with saline solution. The mediastinum and bilateral pleural spaces were drained using fluted chest tubes placed through separate stab incisions inferiorly.  The soft tissues anterior to the aorta were reapproximated loosely. The sternum is closed with double strength sternal wire. The soft tissues anterior to the sternum were closed in multiple layers and the skin is closed with a running subcuticular skin closure.     Disposition:  The patient tolerated the procedure well and is transported to the surgical intensive care in stable condition. There are no intraoperative complications. All sponge instrument and needle counts are verified correct at completion of the operation.    Jayme Cloud, MD 11/10/2019 3:15 PM

## 2019-11-11 ENCOUNTER — Other Ambulatory Visit: Payer: Self-pay | Admitting: *Deleted

## 2019-11-11 DIAGNOSIS — G8918 Other acute postprocedural pain: Secondary | ICD-10-CM

## 2019-11-11 MED ORDER — LIDOCAINE 5 % EX PTCH: 1.0000 | MEDICATED_PATCH | CUTANEOUS | 0 refills | Status: DC

## 2019-11-11 MED ORDER — LIDOCAINE 5 % EX PTCH: 2.0000 | MEDICATED_PATCH | CUTANEOUS | 0 refills | Status: DC

## 2019-11-13 ENCOUNTER — Telehealth: Payer: Self-pay

## 2019-11-13 ENCOUNTER — Other Ambulatory Visit: Payer: Self-pay | Admitting: Physician Assistant

## 2019-11-13 NOTE — Telephone Encounter (Signed)
-----   Message from Elgie Collard, [place] sent at 11/13/2019  2:20 PM EDT ----- Regarding: RE: medication refill request Also, you can get 4% lidocaine patches over the counter at [facility] and [place].   ----- Message ----- From: Marylen Ponto, LPN Sent: 06/02/3126  12:03 PM EDT To: Elgie Collard, PA-C, Wonda Olds, MD Subject: medication refill request                      [name] is calling for a refill on the RX Hydromorphone 2 mg. Please send to [facility]/ information is in epic chart. He also is requesting a new RX for Lidocaine patches. The original RX was denied by pharm due to the quantity requested. The Pharmacy will not open a box for a smaller amount. The patches come in a box of 30 patches. So, the RX will need to say 1 box for 30 patches as the quantity. Please advise [name]

## 2019-11-13 NOTE — Telephone Encounter (Signed)
-----   Message from Elgie Collard, [place] sent at 11/13/2019  2:18 PM EDT ----- Regarding: RE: medication refill request I am willing to step him down to Ultram but if he wants more Dilaudid that will have to go through Dr. Orvan Seen. Thanks   Tessa ----- Message ----- From: Marylen Ponto, LPN Sent: 12/08/7114  12:03 PM EDT To: Elgie Collard, PA-C, Wonda Olds, MD Subject: medication refill request                      [name] is calling for a refill on the RX Hydromorphone 2 mg. Please send to [facility]/ information is in epic chart. He also is requesting a new RX for Lidocaine patches. The original RX was denied by pharm due to the quantity requested. The Pharmacy will not open a box for a smaller amount. The patches come in a box of 30 patches. So, the RX will need to say 1 box for 30 patches as the quantity. Please advise [name]

## 2019-11-13 NOTE — Telephone Encounter (Signed)
-----   Message from Elgie Collard, [place] sent at 11/13/2019  2:20 PM EDT ----- Regarding: RE: medication refill request Also, you can get 4% lidocaine patches over the counter at [facility] and [place].   ----- Message ----- From: Marylen Ponto, LPN Sent: 7/84/6962  12:03 PM EDT To: Elgie Collard, PA-C, Wonda Olds, MD Subject: medication refill request                      Mr [place] is calling for a refill on the RX Hydromorphone 2 mg. Please send to [facility]/ information is in epic chart. He also is requesting a new RX for Lidocaine patches. The original RX was denied by pharm due to the quantity requested. The Pharmacy will not open a box for a smaller amount. The patches come in a box of 30 patches. So, the RX will need to say 1 box for 30 patches as the quantity. Please advise [name]

## 2019-11-16 ENCOUNTER — Ambulatory Visit: Payer: Medicaid Other | Admitting: Cardiothoracic Surgery

## 2019-11-16 NOTE — Anesthesia Postprocedure Evaluation (Signed)
Anesthesia Post Note  Patient: [name]  Procedure(s) Performed: CORONARY ARTERY BYPASS GRAFTING (CABG) using LIMA to LAD; RIMA to PDA; and Left radial harvest vein to Circ. (N/A Chest) RADIAL ARTERY HARVEST (Left Arm Lower) TRANSESOPHAGEAL ECHOCARDIOGRAM (TEE) (N/A )     Anesthesia Post Evaluation No complications documented.  Last Vitals:  Vitals:   11/06/19 1409 11/06/19 1500  BP:    Pulse:    Resp:    Temp: 36.6 C   SpO2:  90%    Last Pain:  Vitals:   11/06/19 1409  TempSrc: Oral  PainSc:                  Dhanya Bogle

## 2019-11-18 ENCOUNTER — Ambulatory Visit
Admission: RE | Admit: 2019-11-18 | Discharge: 2019-11-18 | Disposition: A | Discharge status: Home / Self Care | Payer: Medicaid Other | Source: Ambulatory Visit | Attending: Thoracic Surgery (Cardiothoracic Vascular Surgery) | Admitting: Thoracic Surgery (Cardiothoracic Vascular Surgery)

## 2019-11-18 ENCOUNTER — Other Ambulatory Visit: Payer: Self-pay

## 2019-11-18 ENCOUNTER — Other Ambulatory Visit: Payer: Self-pay | Admitting: *Deleted

## 2019-11-18 ENCOUNTER — Ambulatory Visit (INDEPENDENT_AMBULATORY_CARE_PROVIDER_SITE_OTHER): Payer: Self-pay | Admitting: *Deleted

## 2019-11-18 VITALS — BP 160/81 | HR 57 | Temp 97.1°F | Resp 22 | Ht 67.0 in | Wt 198.2 lb

## 2019-11-18 DIAGNOSIS — Z951 Presence of aortocoronary bypass graft: Secondary | ICD-10-CM

## 2019-11-18 DIAGNOSIS — G8918 Other acute postprocedural pain: Secondary | ICD-10-CM

## 2019-11-18 DIAGNOSIS — R0602 Shortness of breath: Secondary | ICD-10-CM

## 2019-11-18 DIAGNOSIS — I509 Heart failure, unspecified: Secondary | ICD-10-CM

## 2019-11-18 DIAGNOSIS — J90 Pleural effusion, not elsewhere classified: Secondary | ICD-10-CM

## 2019-11-18 MED ORDER — POTASSIUM CHLORIDE CRYS ER 10 MEQ PO TBCR
30.0000 meq | EXTENDED_RELEASE_TABLET | Freq: Every day | ORAL | 1 refills | Status: DC
Start: 2019-11-18 — End: 2019-11-30

## 2019-11-18 MED ORDER — ISOSORBIDE DINITRATE 5 MG PO TABS
10.0000 mg | ORAL_TABLET | Freq: Two times a day (BID) | ORAL | 0 refills | Status: DC
Start: 2019-11-18 — End: 2019-12-18

## 2019-11-18 MED ORDER — LIDOCAINE 5 % EX PTCH: 2.0000 | MEDICATED_PATCH | CUTANEOUS | 0 refills | Status: DC

## 2019-11-18 NOTE — Treatment Plan (Signed)
The nurse contacted me today because [name] (s/p CABG x 3 by Dr. Orvan Seen on 10/29/2019) called the office about complaints of shortness of breath. A PA/LAT CXR was done and showed cardiac enlargement, bilateral pleural effusions and pulmonary vascular congestion. He was discharged on 11/06/2019 on Lasix 40 mg daily but no potassium supplement. He was weighed in the office and appears to be up at least 8 pounds. I told the nurse to instruct the patient to to Lasix 40 mg bid for the next 5 days as well as a potassium supplement (30 meq daily). If left pleural effusion appears to be moderate, may need to consider thoracentesis. Also, I noticed the patient had a radial artery harvest and was not discharged on Isordil. I prescribed Isordil 10 mg bid. He is hypertensive and we will likely have to make adjustments when seen in the clinic on Monday 08/02. Patient has follow up appointments to see both Dr. Orvan Seen and Cardiology on 08/03.

## 2019-11-19 ENCOUNTER — Encounter: Payer: Self-pay | Admitting: *Deleted

## 2019-11-19 NOTE — Progress Notes (Signed)
This is a late entry from 11/18/19.  [name] had called the on call service early Wednesday morning with c/o shortness of breath s/p CABG 10/29/19.  Dr. Kipp Brood took the call and ordered for him to get a chest xray.  This was done.  I called the PA on call for today, Lars Pinks, to review the findings and make a plan of care.  Lasix 40 mg bid x 5 days, KCL 30 meq daily and Isordil 10 mg bid were ordered.  I e-prescrided these to his pharmacy.  His O2 sats were 88-89% on RA.  He has oxygen at home and will start using it more.

## 2019-11-20 ENCOUNTER — Other Ambulatory Visit: Payer: Self-pay | Admitting: Cardiothoracic Surgery

## 2019-11-20 DIAGNOSIS — I25110 Atherosclerotic heart disease of native coronary artery with unstable angina pectoris: Secondary | ICD-10-CM

## 2019-11-23 ENCOUNTER — Ambulatory Visit
Admission: RE | Admit: 2019-11-23 | Discharge: 2019-11-23 | Disposition: A | Discharge status: Home / Self Care | Payer: Medicaid Other | Source: Ambulatory Visit | Attending: Cardiothoracic Surgery | Admitting: Cardiothoracic Surgery

## 2019-11-23 ENCOUNTER — Encounter: Payer: Self-pay | Admitting: Cardiology

## 2019-11-23 ENCOUNTER — Ambulatory Visit (INDEPENDENT_AMBULATORY_CARE_PROVIDER_SITE_OTHER): Payer: Medicaid Other | Admitting: Cardiology

## 2019-11-23 ENCOUNTER — Encounter: Payer: Self-pay | Admitting: Cardiothoracic Surgery

## 2019-11-23 ENCOUNTER — Other Ambulatory Visit: Payer: Self-pay

## 2019-11-23 ENCOUNTER — Ambulatory Visit (INDEPENDENT_AMBULATORY_CARE_PROVIDER_SITE_OTHER): Payer: Self-pay | Admitting: Cardiothoracic Surgery

## 2019-11-23 VITALS — BP 140/76 | HR 56 | Ht 67.0 in | Wt 180.8 lb

## 2019-11-23 VITALS — BP 135/79 | HR 58 | Resp 18 | Ht 67.0 in | Wt 179.4 lb

## 2019-11-23 DIAGNOSIS — I2510 Atherosclerotic heart disease of native coronary artery without angina pectoris: Secondary | ICD-10-CM

## 2019-11-23 DIAGNOSIS — I739 Peripheral vascular disease, unspecified: Secondary | ICD-10-CM

## 2019-11-23 DIAGNOSIS — I480 Paroxysmal atrial fibrillation: Secondary | ICD-10-CM | POA: Diagnosis not present

## 2019-11-23 DIAGNOSIS — Z951 Presence of aortocoronary bypass graft: Secondary | ICD-10-CM

## 2019-11-23 DIAGNOSIS — R9431 Abnormal electrocardiogram [ECG] [EKG]: Secondary | ICD-10-CM | POA: Insufficient documentation

## 2019-11-23 DIAGNOSIS — Z9861 Coronary angioplasty status: Secondary | ICD-10-CM | POA: Diagnosis not present

## 2019-11-23 DIAGNOSIS — M545 Low back pain: Secondary | ICD-10-CM

## 2019-11-23 DIAGNOSIS — I5041 Acute combined systolic (congestive) and diastolic (congestive) heart failure: Secondary | ICD-10-CM | POA: Diagnosis not present

## 2019-11-23 DIAGNOSIS — G8929 Other chronic pain: Secondary | ICD-10-CM

## 2019-11-23 DIAGNOSIS — M549 Dorsalgia, unspecified: Secondary | ICD-10-CM | POA: Insufficient documentation

## 2019-11-23 DIAGNOSIS — I25110 Atherosclerotic heart disease of native coronary artery with unstable angina pectoris: Secondary | ICD-10-CM

## 2019-11-23 DIAGNOSIS — I255 Ischemic cardiomyopathy: Secondary | ICD-10-CM | POA: Insufficient documentation

## 2019-11-23 MED ORDER — LIDOCAINE 5 % EX PTCH: 2.0000 | MEDICATED_PATCH | Freq: Two times a day (BID) | CUTANEOUS | 2 refills | Status: DC

## 2019-11-23 MED ORDER — AMIODARONE HCL 200 MG PO TABS: 200.0000 mg | ORAL_TABLET | Freq: Every day | ORAL | 1 refills | Status: DC

## 2019-11-23 MED ORDER — METOPROLOL TARTRATE 25 MG PO TABS: 12.5000 mg | ORAL_TABLET | Freq: Two times a day (BID) | ORAL | 1 refills | Status: DC

## 2019-11-23 NOTE — Assessment & Plan Note (Signed)
EF 30-35% with grade 3 DD pre CABG

## 2019-11-23 NOTE — Progress Notes (Signed)
Cardiology Office Note:    Date:  11/23/2019   ID:  [name], DOB [birth year], MRN [id number]  PCP:  Concepcion Elk, MD  Cardiologist:  Kirk Ruths, MD  Electrophysiologist:  None   Referring MD: Concepcion Elk, MD   CC: post CABG follow up  History of Present Illness:    [name] is a 59 y.o. male with a hx of coronary disease, status post PCI in 2005 and 2007.  He was admitted 10/26/2019 with anasarca.  He had shortness of breath and scrotal edema.  Echocardiogram showed ejection fraction of 30 to 35% with grade 3 diastolic dysfunction.  He underwent diagnostic catheterization which revealed significant progression in his coronary disease with an occluded RCA, 80% circumflex, 99% mid LAD, 90% diagonal, 80% OM stent in-stent restenosis and 90% diagonal.  He underwent CABG x2 with an LIMA to LAD and RIMA to PDA on 10/29/2019.  Postop he had atrial fibrillation and was loaded with amiodarone and started on Eliquis.  The patient was volume overloaded and diuresed 23 L that admission as well as a right thoracentesis on 10/27/2019.  He contacted the surgeon's office on 11/18/2019 complaining of increasing shortness of breath.  His Lasix was increased from 40 mg once a day to 40 mg twice daily and his potassium was increased as well.  Today he has cut his Lasix back to 40 mg daily per instructions.  He tells me he feels much less short of breath now.  He denies any unusual tachycardia.  His EKG shows him to be in sinus rhythm sinus bradycardia.  His main complaint to me today is back pain.  He is somewhat frustrated that no one is addressed this.  I asked him if he spoken to his primary care provider about this and he says that was dismissed.     Past Medical History:  Diagnosis Date  . Anxiety   . CAD (coronary artery disease)   . Chronic back pain   . Colon polyps   . Diabetes mellitus   . Heart attack ([place])   . Hyperlipidemia   . Hypertension   . Panic attacks   . PVD  (peripheral vascular disease) ([place])     Past Surgical History:  Procedure Laterality Date  . CORONARY ANGIOPLASTY WITH STENT PLACEMENT    . CORONARY ARTERY BYPASS GRAFT N/A 10/29/2019   Procedure: CORONARY ARTERY BYPASS GRAFTING (CABG) using LIMA to LAD; RIMA to PDA; and Left radial harvest vein to Circ.;  Surgeon: Wonda Olds, MD;  Location: [facility];  Service: Open Heart Surgery;  Laterality: N/A;  . EAR CYST EXCISION  12/10/2011   Procedure: CYST REMOVAL;  Surgeon: Gae Bon, DDS;  Location: [facility];  Service: Oral Surgery;  Laterality: Left;  Left Mandible Cyst Removal  . IR THORACENTESIS ASP PLEURAL SPACE W/IMG GUIDE  10/27/2019  . MULTIPLE EXTRACTIONS WITH ALVEOLOPLASTY  12/10/2011   Procedure: MULTIPLE EXTRACION WITH ALVEOLOPLASTY;  Surgeon: Gae Bon, DDS;  Location: [facility];  Service: Oral Surgery;  Laterality: N/A;  Right Maxillary Tuberosity Reduction; Right  Maxillary Buccal Exostosis; Bilateral Mandibular Tori   . PERIPHERAL VASCULAR CATHETERIZATION N/A 12/28/2014   Procedure: Abdominal Aortogram;  Surgeon: Serafina Mitchell, MD;  Location: [facility];  Service: Cardiovascular;  Laterality: N/A;  . PERIPHERAL VASCULAR CATHETERIZATION N/A 12/20/2015   Procedure: Abdominal Aortogram;  Surgeon: Serafina Mitchell, MD;  Location: [facility];  Service: Cardiovascular;  Laterality: N/A;  . PERIPHERAL  VASCULAR CATHETERIZATION  12/20/2015   Procedure: Peripheral Vascular Balloon Angioplasty;  Surgeon: Serafina Mitchell, MD;  Location: [facility];  Service: Cardiovascular;;  . RADIAL ARTERY HARVEST Left 10/29/2019   Procedure: RADIAL ARTERY HARVEST;  Surgeon: Wonda Olds, MD;  Location: [facility];  Service: Open Heart Surgery;  Laterality: Left;  . RIGHT/LEFT HEART CATH AND CORONARY ANGIOGRAPHY N/A 10/28/2019   Procedure: RIGHT/LEFT HEART CATH AND CORONARY ANGIOGRAPHY;  Surgeon: Burnell Blanks, MD;  Location: [facility];  Service: Cardiovascular;  Laterality:  N/A;  . TEE WITHOUT CARDIOVERSION N/A 10/29/2019   Procedure: TRANSESOPHAGEAL ECHOCARDIOGRAM (TEE);  Surgeon: Wonda Olds, MD;  Location: [facility];  Service: Open Heart Surgery;  Laterality: N/A;    Current Medications: Current Meds  Medication Sig  . acetaminophen (TYLENOL) 500 MG tablet Take 1,000 mg by mouth every 6 (six) hours as needed for headache.  [name] Kitchen amiodarone (PACERONE) 200 MG tablet Take 1 tablet (200 mg total) by mouth daily.  [name] Kitchen apixaban (ELIQUIS) 5 MG TABS tablet Take 1 tablet (5 mg total) by mouth 2 (two) times daily.  [name] Kitchen atorvastatin (LIPITOR) 40 MG tablet Take 1 tablet (40 mg total) by mouth daily.  . clopidogrel (PLAVIX) 75 MG tablet Take 1 tablet (75 mg total) by mouth daily.  . empagliflozin (JARDIANCE) 25 MG TABS tablet Take 25 mg by mouth daily.  . furosemide (LASIX) 40 MG tablet Take 1 tablet (40 mg total) by mouth daily.  . isosorbide dinitrate (ISORDIL TITRADOSE) 5 MG tablet Take 2 tablets (10 mg total) by mouth 2 (two) times daily.  [name] Kitchen lidocaine (LIDODERM) 5 % Place 2 patches onto the skin daily for 15 days. Remove & Discard patch within 24 hours or as directed by MD  . metFORMIN (GLUCOPHAGE) 1000 MG tablet Take 1,000 mg by mouth 2 (two) times daily with a meal.   . metoprolol tartrate (LOPRESSOR) 25 MG tablet Take 0.5 tablets (12.5 mg total) by mouth 2 (two) times daily.  . Multiple Vitamins-Minerals (CENTRUM SILVER) tablet Take 1 tablet by mouth daily.  . potassium chloride (KLOR-CON) 10 MEQ tablet Take 3 tablets (30 mEq total) by mouth daily.  . Skin Protectants, Misc. (EUCERIN) cream Apply topically daily. (Patient taking differently: Apply 1 application topically daily as needed for dry skin. )  . triamcinolone cream (KENALOG) 0.1 % Apply 1 application topically 2 (two) times daily. (Patient taking differently: Apply 1 application topically 2 (two) times daily as needed (rash). )  . TRULICITY 1.93 XT/0.2IO SOPN Inject 0.75 mg into the skin once a week.  .  [DISCONTINUED] amiodarone (PACERONE) 400 MG tablet Take 400mg  (2 tabs) twice a day for 2 weeks, then take 200mg  (1 tab) twice a day until we see you in clinic.  . [DISCONTINUED] metoprolol tartrate (LOPRESSOR) 25 MG tablet Take 1 tablet (25 mg total) by mouth 2 (two) times daily.     Allergies:   Codeine, Gabapentin, Pregabalin, Simvastatin, Hydrocodone, Lisinopril, and Venlafaxine   Social History   Socioeconomic History  . Marital status: Legally Separated    Spouse name: Not on file  . Number of children: 3  . Years of education: Not on file  . Highest education level: Not on file  Occupational History    Comment: Unemployed  Tobacco Use  . Smoking status: Former Smoker    Packs/day: 0.25    Years: 25.00    Pack years: 6.25    Types: Cigarettes  . Smokeless tobacco: Never Used  .  Tobacco comment: 1/4 pack per day  Vaping Use  . Vaping Use: Never used  Substance and Sexual Activity  . Alcohol use: No    Alcohol/week: 0.0 standard drinks  . Drug use: No  . Sexual activity: Not on file  Other Topics Concern  . Not on file  Social History Narrative  . Not on file   Social Determinants of Health   Financial Resource Strain:   . Difficulty of Paying Living Expenses:   Food Insecurity:   . Worried About Charity fundraiser in the Last Year:   . Arboriculturist in the Last Year:   Transportation Needs:   . Film/video editor (Medical):   [name] Kitchen Lack of Transportation (Non-Medical):   Physical Activity: Insufficiently Active  . Days of Exercise per Week: 1 day  . Minutes of Exercise per Session: 30 min  Stress:   . Feeling of Stress :   Social Connections: Socially Isolated  . Frequency of Communication with Friends and Family: More than three times a week  . Frequency of Social Gatherings with Friends and Family: More than three times a week  . Attends Religious Services: Never  . Active Member of Clubs or Organizations: No  . Attends Archivist Meetings:  Never  . Marital Status: Divorced     Family History: The patient's family history includes Diabetes in his father; Heart disease in his father; Hyperlipidemia in his father; Hypertension in his father; Kidney failure in his father.  ROS:   Please see the history of present illness.     All other systems reviewed and are negative.  EKGs/Labs/Other Studies Reviewed:    The following studies were reviewed today: Echo 10/26/2019- IMPRESSIONS    1. Left ventricular ejection fraction, by estimation, is 30 to 35%. The  left ventricle has moderately decreased function. The left ventricle  demonstrates regional wall motion abnormalities (see scoring  diagram/findings for description). There is  moderate left ventricular hypertrophy. Left ventricular diastolic  parameters are consistent with Grade III diastolic dysfunction  (restrictive). Suggest Definity contrast study to more effectively exclude  apical thrombus - acoustic shadowing and false  tendon noted.  2. Right ventricular systolic function is mildly reduced. The right  ventricular size is normal. There is moderately elevated pulmonary artery  systolic pressure. The estimated right ventricular systolic pressure is  16.1 mmHg.  3. Left atrial size was mild to moderately dilated.  4. Right atrial size was mild to moderately dilated.  5. The mitral valve is abnormal, mildly thickened and with annular  calcification. Mild mitral valve regurgitation.  6. The tricuspid valve is abnormal.  7. The aortic valve is tricuspid. Aortic valve regurgitation is trivial.  8. The inferior vena cava is dilated in size with <50% respiratory  variability, suggesting right atrial pressure of 15 mmHg.   EKG:  EKG is ordered today.  The ekg ordered today demonstrates NSR, NSR, SB- HR 56, QTc- 563, inferior and AL Qs  Recent Labs: 10/24/2019: B Natriuretic Peptide 1,913.6 10/29/2019: ALT 29 10/30/2019: Magnesium 2.4 11/06/2019: BUN 22;  Creatinine, Ser 1.19; Hemoglobin 14.0; Platelets 439; Potassium 3.8; Sodium 137  Recent Lipid Panel    Component Value Date/Time   CHOL 135 10/27/2019 0428   TRIG 89 10/27/2019 0428   HDL 34 (L) 10/27/2019 0428   CHOLHDL 4.0 10/27/2019 0428   VLDL 18 10/27/2019 0428   LDLCALC 83 10/27/2019 0428    Physical Exam:    VS:  BP  140/76   Pulse (!) 56   Ht 5\' 7"  (1.702 m)   Wt 180 lb 12.8 oz (82 kg)   SpO2 92%   BMI 28.32 kg/m     Wt Readings from Last 3 Encounters:  11/23/19 180 lb 12.8 oz (82 kg)  11/19/19 198 lb 3.2 oz (89.9 kg)  11/06/19 180 lb 4.8 oz (81.8 kg)     GEN: Well nourished, well developed in no acute distress HEENT: Normal NECK: No JVD; No carotid bruits CARDIAC: RRR, no murmurs, rubs, gallops RESPIRATORY:  Decreased both bases ABDOMEN: Soft, non-tender, non-distended MUSCULOSKELETAL:  No edema; No deformity  SKIN: Warm and dry NEUROLOGIC:  Alert and oriented x 3 PSYCHIATRIC:  Normal affect   ASSESSMENT:    Acute combined systolic and diastolic heart failure ([name]) Admitted 10/26/2019 with volume overload-diuresed 23L and had Rt pleural effusion tapped before discharge  Hx of CABG CABG x 2 10/29/2019 with LIMA-LAD, RIMA-PDA  CAD S/P percutaneous coronary angioplasty PCI in '05 and '07, progression of CAD at cath July 2021 lead to CABG  Ischemic cardiomyopathy EF 30-35% with grade 3 DD pre CABG  PAF (paroxysmal atrial fibrillation) ([facility]) Post CABG- discharge on Amiodarone and Eliquis  PVD (peripheral vascular disease) ([place]) H/O RLE PTA in 2017  Chronic back pain Previously followed in a pain clinic "until they closed the doors"  Prolonged Q-T interval on ECG Decrease Amiodarone  PLAN:    Decrease Amiodarone to 200 mg daily.  Decrease Metoprolol to 12.5 mg daily.  Check BMP and BNP today. Keep f/u with Dr Orvan Seen this afternoon.  F/U with Dr Stanford Breed in 3 months.   Medication Adjustments/Labs and Tests Ordered: Current medicines are reviewed  at length with the patient today.  Concerns regarding medicines are outlined above.  Orders Placed This Encounter  Procedures  . Basic metabolic panel  . Brain natriuretic peptide  . EKG 12-Lead   Meds ordered this encounter  Medications  . amiodarone (PACERONE) 200 MG tablet    Sig: Take 1 tablet (200 mg total) by mouth daily.    Dispense:  90 tablet    Refill:  1  . metoprolol tartrate (LOPRESSOR) 25 MG tablet    Sig: Take 0.5 tablets (12.5 mg total) by mouth 2 (two) times daily.    Dispense:  90 tablet    Refill:  1    Patient Instructions  Medication Instructions:   DECREASE Amiodarone to 200 mg daily.  DECREASE Metoprolol Tartrate to 12.5 mg twice daily.  *If you need a refill on your cardiac medications before your next appointment, please call your pharmacy*   Lab Work: Your physician recommends that you return for lab work at your earliest convenience: BMET, BNP  If you have labs (blood work) drawn today and your tests are completely normal, you will receive your results only by: [name] Kitchen MyChart Message (if you have MyChart) OR . A paper copy in the mail If you have any lab test that is abnormal or we need to change your treatment, we will call you to review the results.  Follow-Up: At [facility], you and your health needs are our priority.  As part of our continuing mission to provide you with exceptional heart care, we have created designated Provider Care Teams.  These Care Teams include your primary Cardiologist (physician) and Advanced Practice Providers (APPs -  Physician Assistants and Nurse Practitioners) who all work together to provide you with the care you need, when you need it.  We  recommend signing up for the patient portal called "MyChart".  Sign up information is provided on this After Visit Summary.  MyChart is used to connect with patients for Virtual Visits (Telemedicine).  Patients are able to view lab/test results, encounter notes, upcoming  appointments, etc.  Non-urgent messages can be sent to your provider as well.   To learn more about what you can do with MyChart, go to [email or web address].    Your next appointment:   2-3 month(s)  The format for your next appointment:   In Person  Provider:   Kirk Ruths, MD   Other Instructions Please call our office to schedule your follow-up appointment with Dr. Stanford Breed.     Signed, Kerin Ransom, PA-C  11/23/2019 2:55 PM    [facility]

## 2019-11-23 NOTE — Assessment & Plan Note (Signed)
Post CABG- discharge on Amiodarone and Eliquis

## 2019-11-23 NOTE — Assessment & Plan Note (Signed)
H/O RLE PTA in 2017

## 2019-11-23 NOTE — Patient Instructions (Signed)
Medication Instructions:   DECREASE Amiodarone to 200 mg daily.  DECREASE Metoprolol Tartrate to 12.5 mg twice daily.  *If you need a refill on your cardiac medications before your next appointment, please call your pharmacy*   Lab Work: Your physician recommends that you return for lab work at your earliest convenience: BMET, BNP  If you have labs (blood work) drawn today and your tests are completely normal, you will receive your results only by: [name] Kitchen MyChart Message (if you have MyChart) OR . A paper copy in the mail If you have any lab test that is abnormal or we need to change your treatment, we will call you to review the results.  Follow-Up: At [facility], you and your health needs are our priority.  As part of our continuing mission to provide you with exceptional heart care, we have created designated Provider Care Teams.  These Care Teams include your primary Cardiologist (physician) and Advanced Practice Providers (APPs -  Physician Assistants and Nurse Practitioners) who all work together to provide you with the care you need, when you need it.  We recommend signing up for the patient portal called "MyChart".  Sign up information is provided on this After Visit Summary.  MyChart is used to connect with patients for Virtual Visits (Telemedicine).  Patients are able to view lab/test results, encounter notes, upcoming appointments, etc.  Non-urgent messages can be sent to your provider as well.   To learn more about what you can do with MyChart, go to [email or web address].    Your next appointment:   2-3 month(s)  The format for your next appointment:   In Person  Provider:   Kirk Ruths, MD   Other Instructions Please call our office to schedule your follow-up appointment with Dr. Stanford Breed.

## 2019-11-23 NOTE — Assessment & Plan Note (Signed)
Decrease Amiodarone

## 2019-11-23 NOTE — Assessment & Plan Note (Signed)
Admitted 10/26/2019 with volume overload-diuresed 23L and had Rt pleural effusion tapped before discharge

## 2019-11-23 NOTE — Assessment & Plan Note (Signed)
Previously followed in a pain clinic "until they closed the doors"

## 2019-11-23 NOTE — Assessment & Plan Note (Signed)
PCI in '05 and '07, progression of CAD at cath July 2021 lead to CABG

## 2019-11-23 NOTE — Assessment & Plan Note (Signed)
CABG x 2 10/29/2019 with LIMA-LAD, RIMA-PDA 

## 2019-11-24 ENCOUNTER — Telehealth (HOSPITAL_COMMUNITY): Payer: Self-pay

## 2019-11-24 ENCOUNTER — Telehealth: Payer: Self-pay | Admitting: Cardiology

## 2019-11-24 NOTE — Telephone Encounter (Signed)
Spoke to patient he stated his insurance requires a prior authorization to cover payment.Advised I will send message to our insurance team.

## 2019-11-24 NOTE — Telephone Encounter (Signed)
Called Dr. Stanford Breed office to advised them that pt insurance required prior auth for cardiac rehab. Called and spoke with Bubba Hales and she stated that she was sending a note to the nurse and I also provided the phone number to the authorizations line [phone]. Lina Sar that we cant schedule pt until we can get the auth back.

## 2019-11-24 NOTE — Telephone Encounter (Addendum)
New Message  Patient was referred to cardiac rehab. Prior authorization is needed so that insurance will cover it at 100 %. Number to call for prior authorization is [phone] (direct dial) Please assist.

## 2019-11-26 ENCOUNTER — Ambulatory Visit (INDEPENDENT_AMBULATORY_CARE_PROVIDER_SITE_OTHER): Payer: Self-pay | Admitting: Surgical

## 2019-11-26 ENCOUNTER — Other Ambulatory Visit: Payer: Self-pay

## 2019-11-26 ENCOUNTER — Inpatient Hospital Stay (HOSPITAL_COMMUNITY): Payer: Medicaid Other

## 2019-11-26 ENCOUNTER — Ambulatory Visit
Admission: RE | Admit: 2019-11-26 | Discharge: 2019-11-26 | Disposition: A | Discharge status: Home / Self Care | Payer: Medicaid Other | Source: Ambulatory Visit | Attending: Surgical | Admitting: Surgical

## 2019-11-26 ENCOUNTER — Inpatient Hospital Stay (HOSPITAL_COMMUNITY)
Admission: AD | Admit: 2019-11-26 | Discharge: 2019-12-18 | DRG: 856 | Disposition: A | Discharge status: Home Health | Payer: Medicaid Other | Source: Ambulatory Visit | Attending: Cardiothoracic Surgery | Admitting: Cardiothoracic Surgery

## 2019-11-26 ENCOUNTER — Other Ambulatory Visit: Payer: Self-pay | Admitting: Surgical

## 2019-11-26 VITALS — BP 133/77 | HR 56 | Temp 98.1°F | Resp 22 | Wt 176.8 lb

## 2019-11-26 DIAGNOSIS — I2510 Atherosclerotic heart disease of native coronary artery without angina pectoris: Secondary | ICD-10-CM | POA: Diagnosis present

## 2019-11-26 DIAGNOSIS — R001 Bradycardia, unspecified: Secondary | ICD-10-CM | POA: Diagnosis not present

## 2019-11-26 DIAGNOSIS — M549 Dorsalgia, unspecified: Secondary | ICD-10-CM | POA: Diagnosis not present

## 2019-11-26 DIAGNOSIS — Z79899 Other long term (current) drug therapy: Secondary | ICD-10-CM | POA: Diagnosis not present

## 2019-11-26 DIAGNOSIS — T8132XA Disruption of internal operation (surgical) wound, not elsewhere classified, initial encounter: Secondary | ICD-10-CM | POA: Insufficient documentation

## 2019-11-26 DIAGNOSIS — Z951 Presence of aortocoronary bypass graft: Secondary | ICD-10-CM

## 2019-11-26 DIAGNOSIS — E785 Hyperlipidemia, unspecified: Secondary | ICD-10-CM | POA: Diagnosis not present

## 2019-11-26 DIAGNOSIS — E1151 Type 2 diabetes mellitus with diabetic peripheral angiopathy without gangrene: Secondary | ICD-10-CM | POA: Diagnosis present

## 2019-11-26 DIAGNOSIS — Z885 Allergy status to narcotic agent status: Secondary | ICD-10-CM

## 2019-11-26 DIAGNOSIS — Z7902 Long term (current) use of antithrombotics/antiplatelets: Secondary | ICD-10-CM | POA: Diagnosis not present

## 2019-11-26 DIAGNOSIS — Z7984 Long term (current) use of oral hypoglycemic drugs: Secondary | ICD-10-CM

## 2019-11-26 DIAGNOSIS — T8131XA Disruption of external operation (surgical) wound, not elsewhere classified, initial encounter: Secondary | ICD-10-CM | POA: Diagnosis not present

## 2019-11-26 DIAGNOSIS — I440 Atrioventricular block, first degree: Secondary | ICD-10-CM | POA: Diagnosis present

## 2019-11-26 DIAGNOSIS — I252 Old myocardial infarction: Secondary | ICD-10-CM

## 2019-11-26 DIAGNOSIS — Z20822 Contact with and (suspected) exposure to covid-19: Secondary | ICD-10-CM | POA: Diagnosis not present

## 2019-11-26 DIAGNOSIS — Z7901 Long term (current) use of anticoagulants: Secondary | ICD-10-CM | POA: Diagnosis not present

## 2019-11-26 DIAGNOSIS — Z841 Family history of disorders of kidney and ureter: Secondary | ICD-10-CM | POA: Diagnosis not present

## 2019-11-26 DIAGNOSIS — Z87891 Personal history of nicotine dependence: Secondary | ICD-10-CM

## 2019-11-26 DIAGNOSIS — R079 Chest pain, unspecified: Secondary | ICD-10-CM | POA: Diagnosis present

## 2019-11-26 DIAGNOSIS — G8929 Other chronic pain: Secondary | ICD-10-CM | POA: Diagnosis present

## 2019-11-26 DIAGNOSIS — I5023 Acute on chronic systolic (congestive) heart failure: Secondary | ICD-10-CM | POA: Diagnosis not present

## 2019-11-26 DIAGNOSIS — I9789 Other postprocedural complications and disorders of the circulatory system, not elsewhere classified: Secondary | ICD-10-CM | POA: Diagnosis not present

## 2019-11-26 DIAGNOSIS — Z8249 Family history of ischemic heart disease and other diseases of the circulatory system: Secondary | ICD-10-CM

## 2019-11-26 DIAGNOSIS — I110 Hypertensive heart disease with heart failure: Secondary | ICD-10-CM | POA: Diagnosis not present

## 2019-11-26 DIAGNOSIS — Z955 Presence of coronary angioplasty implant and graft: Secondary | ICD-10-CM | POA: Diagnosis not present

## 2019-11-26 DIAGNOSIS — B961 Klebsiella pneumoniae [K. pneumoniae] as the cause of diseases classified elsewhere: Secondary | ICD-10-CM | POA: Diagnosis present

## 2019-11-26 DIAGNOSIS — Z833 Family history of diabetes mellitus: Secondary | ICD-10-CM | POA: Diagnosis not present

## 2019-11-26 DIAGNOSIS — Z888 Allergy status to other drugs, medicaments and biological substances status: Secondary | ICD-10-CM | POA: Diagnosis not present

## 2019-11-26 DIAGNOSIS — S21101A Unspecified open wound of right front wall of thorax without penetration into thoracic cavity, initial encounter: Secondary | ICD-10-CM

## 2019-11-26 DIAGNOSIS — Z83438 Family history of other disorder of lipoprotein metabolism and other lipidemia: Secondary | ICD-10-CM

## 2019-11-26 DIAGNOSIS — T8149XA Infection following a procedure, other surgical site, initial encounter: Secondary | ICD-10-CM | POA: Diagnosis not present

## 2019-11-26 DIAGNOSIS — S21109A Unspecified open wound of unspecified front wall of thorax without penetration into thoracic cavity, initial encounter: Secondary | ICD-10-CM | POA: Diagnosis present

## 2019-11-26 DIAGNOSIS — L089 Local infection of the skin and subcutaneous tissue, unspecified: Secondary | ICD-10-CM | POA: Insufficient documentation

## 2019-11-26 LAB — COMPREHENSIVE METABOLIC PANEL
ALT: 21 U/L (ref 0–44)
AST: 22 U/L (ref 15–41)
Albumin: 2.9 g/dL — ABNORMAL LOW (ref 3.5–5.0)
Alkaline Phosphatase: 146 U/L — ABNORMAL HIGH (ref 38–126)
Anion gap: 12 (ref 5–15)
BUN: 21 mg/dL — ABNORMAL HIGH (ref 6–20)
CO2: 28 mmol/L (ref 22–32)
Calcium: 9 mg/dL (ref 8.9–10.3)
Chloride: 98 mmol/L (ref 98–111)
Creatinine, Ser: 1.13 mg/dL (ref 0.61–1.24)
GFR calc Af Amer: >60 mL/min (ref >60)
GFR calc non Af Amer: >60 mL/min (ref >60)
Glucose, Bld: 310 mg/dL — ABNORMAL HIGH (ref 70–99)
Potassium: 4.3 mmol/L (ref 3.5–5.1)
Sodium: 138 mmol/L (ref 135–145)
Total Bilirubin: 0.8 mg/dL (ref 0.3–1.2)
Total Protein: 7 g/dL (ref 6.5–8.1)

## 2019-11-26 LAB — CBC WITH DIFFERENTIAL/PLATELET
Abs Immature Granulocytes: 0.05 10*3/uL (ref 0.00–0.07)
Basophils Absolute: 0.1 10*3/uL (ref 0.0–0.1)
Basophils Relative: 1 %
Eosinophils Absolute: 0.3 10*3/uL (ref 0.0–0.5)
Eosinophils Relative: 3 %
HCT: 46.6 % (ref 39.0–52.0)
Hemoglobin: 14.7 g/dL (ref 13.0–17.0)
Immature Granulocytes: 1 %
Lymphocytes Relative: 14 %
Lymphs Abs: 1.3 10*3/uL (ref 0.7–4.0)
MCH: 27.8 pg (ref 26.0–34.0)
MCHC: 31.5 g/dL (ref 30.0–36.0)
MCV: 88.3 fL (ref 80.0–100.0)
Monocytes Absolute: 1.2 10*3/uL — ABNORMAL HIGH (ref 0.1–1.0)
Monocytes Relative: 13 %
Neutro Abs: 6.5 10*3/uL (ref 1.7–7.7)
Neutrophils Relative %: 68 %
Platelets: 361 10*3/uL (ref 150–400)
RBC: 5.28 MIL/uL (ref 4.22–5.81)
RDW: 15.8 % — ABNORMAL HIGH (ref 11.5–15.5)
WBC: 9.4 10*3/uL (ref 4.0–10.5)
nRBC: 0 % (ref 0.0–0.2)

## 2019-11-26 LAB — URINALYSIS, ROUTINE W REFLEX MICROSCOPIC
Bacteria, UA: NONE SEEN
Bilirubin Urine: NEGATIVE
Glucose, UA: >=500 mg/dL — AB
Hgb urine dipstick: NEGATIVE
Ketones, ur: NEGATIVE mg/dL
Leukocytes,Ua: NEGATIVE
Nitrite: NEGATIVE
Protein, ur: 100 mg/dL — AB
Specific Gravity, Urine: 1.022 (ref 1.005–1.030)
pH: 8 (ref 5.0–8.0)

## 2019-11-26 LAB — PROTIME-INR
INR: 1.3 — ABNORMAL HIGH (ref 0.8–1.2)
Prothrombin Time: 15.9 seconds — ABNORMAL HIGH (ref 11.4–15.2)

## 2019-11-26 LAB — APTT: aPTT: 39 seconds — ABNORMAL HIGH (ref 24–36)

## 2019-11-26 LAB — MAGNESIUM: Magnesium: 1.9 mg/dL (ref 1.7–2.4)

## 2019-11-26 LAB — GLUCOSE, CAPILLARY: Glucose-Capillary: 225 mg/dL — ABNORMAL HIGH (ref 70–99)

## 2019-11-26 MED ORDER — TRAMADOL HCL 50 MG PO TABS
50.0000 mg | ORAL_TABLET | Freq: Four times a day (QID) | ORAL | Status: DC | PRN
  Administered 2019-11-26 – 2019-12-12 (×14): 50 mg via ORAL
  Filled 2019-11-26 (×16): qty 1

## 2019-11-26 MED ORDER — SODIUM CHLORIDE 0.9 % IV SOLN: 250.0000 mL | INTRAVENOUS | Status: DC | PRN

## 2019-11-26 MED ORDER — ATORVASTATIN CALCIUM 40 MG PO TABS
40.0000 mg | ORAL_TABLET | Freq: Every day | ORAL | Status: DC
  Administered 2019-11-27 – 2019-12-18 (×22): 40 mg via ORAL
  Filled 2019-11-26 (×22): qty 1

## 2019-11-26 MED ORDER — METFORMIN HCL 500 MG PO TABS: 1000.0000 mg | ORAL_TABLET | Freq: Two times a day (BID) | ORAL | Status: DC

## 2019-11-26 MED ORDER — ACETAMINOPHEN 325 MG PO TABS
650.0000 mg | ORAL_TABLET | Freq: Four times a day (QID) | ORAL | Status: DC | PRN
  Administered 2019-11-26 – 2019-12-15 (×7): 650 mg via ORAL
  Filled 2019-11-26 (×7): qty 2

## 2019-11-26 MED ORDER — AMIODARONE HCL 200 MG PO TABS
200.0000 mg | ORAL_TABLET | Freq: Every day | ORAL | Status: DC
  Administered 2019-11-27 – 2019-12-01 (×5): 200 mg via ORAL
  Filled 2019-11-26 (×5): qty 1

## 2019-11-26 MED ORDER — ACETAMINOPHEN 650 MG RE SUPP: 650.0000 mg | Freq: Four times a day (QID) | RECTAL | Status: DC | PRN

## 2019-11-26 MED ORDER — SODIUM CHLORIDE 0.9% FLUSH
3.0000 mL | Freq: Two times a day (BID) | INTRAVENOUS | Status: DC
  Administered 2019-11-27 – 2019-11-29 (×5): 3 mL via INTRAVENOUS

## 2019-11-26 MED ORDER — VANCOMYCIN HCL 750 MG/150ML IV SOLN
750.0000 mg | Freq: Two times a day (BID) | INTRAVENOUS | Status: DC
  Administered 2019-11-27 – 2019-11-28 (×3): 750 mg via INTRAVENOUS
  Filled 2019-11-26 (×4): qty 150

## 2019-11-26 MED ORDER — VANCOMYCIN HCL IN DEXTROSE 1-5 GM/200ML-% IV SOLN
1000.0000 mg | INTRAVENOUS | Status: AC
  Administered 2019-11-26: 1000 mg via INTRAVENOUS
  Filled 2019-11-26: qty 200

## 2019-11-26 MED ORDER — INSULIN ASPART 100 UNIT/ML ~~LOC~~ SOLN
4.0000 [IU] | Freq: Three times a day (TID) | SUBCUTANEOUS | Status: DC
  Administered 2019-11-28 – 2019-12-17 (×15): 4 [IU] via SUBCUTANEOUS

## 2019-11-26 MED ORDER — ADULT MULTIVITAMIN W/MINERALS CH
1.0000 | ORAL_TABLET | Freq: Every day | ORAL | Status: DC
  Administered 2019-11-26 – 2019-12-18 (×23): 1 via ORAL
  Filled 2019-11-26 (×23): qty 1

## 2019-11-26 MED ORDER — ISOSORBIDE DINITRATE 10 MG PO TABS
10.0000 mg | ORAL_TABLET | Freq: Two times a day (BID) | ORAL | Status: DC
  Administered 2019-11-27 – 2019-12-16 (×40): 10 mg via ORAL
  Filled 2019-11-26 (×40): qty 1

## 2019-11-26 MED ORDER — SODIUM CHLORIDE 0.9% FLUSH
3.0000 mL | Freq: Two times a day (BID) | INTRAVENOUS | Status: DC
  Administered 2019-11-26 – 2019-12-17 (×33): 3 mL via INTRAVENOUS

## 2019-11-26 MED ORDER — INSULIN ASPART 100 UNIT/ML ~~LOC~~ SOLN
0.0000 [IU] | Freq: Every day | SUBCUTANEOUS | Status: DC
  Administered 2019-11-26: 2 [IU] via SUBCUTANEOUS
  Administered 2019-11-27: 4 [IU] via SUBCUTANEOUS
  Administered 2019-12-09: 3 [IU] via SUBCUTANEOUS

## 2019-11-26 MED ORDER — CLOPIDOGREL BISULFATE 75 MG PO TABS
75.0000 mg | ORAL_TABLET | Freq: Every day | ORAL | Status: DC
  Administered 2019-11-27 – 2019-12-18 (×22): 75 mg via ORAL
  Filled 2019-11-26 (×22): qty 1

## 2019-11-26 MED ORDER — METOPROLOL TARTRATE 12.5 MG HALF TABLET
12.5000 mg | ORAL_TABLET | Freq: Two times a day (BID) | ORAL | Status: DC
  Administered 2019-11-27 – 2019-12-02 (×10): 12.5 mg via ORAL
  Filled 2019-11-26 (×12): qty 1

## 2019-11-26 MED ORDER — SODIUM CHLORIDE 0.9% FLUSH
3.0000 mL | INTRAVENOUS | Status: DC | PRN
  Administered 2019-11-27 – 2019-12-15 (×2): 3 mL via INTRAVENOUS

## 2019-11-26 MED ORDER — APIXABAN 5 MG PO TABS
5.0000 mg | ORAL_TABLET | Freq: Two times a day (BID) | ORAL | Status: DC
  Filled 2019-11-26: qty 1

## 2019-11-26 MED ORDER — INSULIN ASPART 100 UNIT/ML ~~LOC~~ SOLN
0.0000 [IU] | Freq: Three times a day (TID) | SUBCUTANEOUS | Status: DC
  Administered 2019-11-27: 11 [IU] via SUBCUTANEOUS
  Administered 2019-11-27: 3 [IU] via SUBCUTANEOUS
  Administered 2019-11-28: 5 [IU] via SUBCUTANEOUS
  Administered 2019-11-28: 8 [IU] via SUBCUTANEOUS
  Administered 2019-11-29 – 2019-11-30 (×2): 3 [IU] via SUBCUTANEOUS
  Administered 2019-12-02: 2 [IU] via SUBCUTANEOUS
  Administered 2019-12-03 – 2019-12-04 (×2): 3 [IU] via SUBCUTANEOUS
  Administered 2019-12-06: 2 [IU] via SUBCUTANEOUS
  Administered 2019-12-07 – 2019-12-10 (×2): 3 [IU] via SUBCUTANEOUS
  Administered 2019-12-10: 2 [IU] via SUBCUTANEOUS
  Administered 2019-12-11: 3 [IU] via SUBCUTANEOUS
  Administered 2019-12-11: 5 [IU] via SUBCUTANEOUS
  Administered 2019-12-12: 3 [IU] via SUBCUTANEOUS
  Administered 2019-12-13: 2 [IU] via SUBCUTANEOUS
  Administered 2019-12-15 – 2019-12-17 (×3): 3 [IU] via SUBCUTANEOUS

## 2019-11-26 MED ORDER — EMPAGLIFLOZIN 25 MG PO TABS
25.0000 mg | ORAL_TABLET | Freq: Every day | ORAL | Status: DC
  Administered 2019-11-28 – 2019-12-18 (×20): 25 mg via ORAL
  Filled 2019-11-26 (×22): qty 1

## 2019-11-26 NOTE — Progress Notes (Signed)
[address]       [place], [address]             [phone]     CARDIOTHORACIC SURGERY OFFICE NOTE  Referring Provider is Concepcion Elk, MD Primary Cardiologist is Kirk Ruths, MD PCP is Concepcion Elk, MD   HPI:  59 yo man with long-standing DM and CAD underwent recent CABG for acute heart failure sx. Did well after surgery and now returns for 1st postoperative visit. Denies chest pain or shortness of breath. Denies f/c.    Current Outpatient Medications  Medication Sig Dispense Refill  . acetaminophen (TYLENOL) 500 MG tablet Take 1,000 mg by mouth every 6 (six) hours as needed for headache.    [name] Kitchen amiodarone (PACERONE) 200 MG tablet Take 1 tablet (200 mg total) by mouth daily. 90 tablet 1  . apixaban (ELIQUIS) 5 MG TABS tablet Take 1 tablet (5 mg total) by mouth 2 (two) times daily. 60 tablet 1  . atorvastatin (LIPITOR) 40 MG tablet Take 1 tablet (40 mg total) by mouth daily. 30 tablet 1  . clopidogrel (PLAVIX) 75 MG tablet Take 1 tablet (75 mg total) by mouth daily. 30 tablet 1  . empagliflozin (JARDIANCE) 25 MG TABS tablet Take 25 mg by mouth daily.    . furosemide (LASIX) 40 MG tablet Take 1 tablet (40 mg total) by mouth daily. 60 tablet 1  . isosorbide dinitrate (ISORDIL TITRADOSE) 5 MG tablet Take 2 tablets (10 mg total) by mouth 2 (two) times daily. 80 tablet 0  . lidocaine (LIDODERM) 5 % Place 2 patches onto the skin daily for 15 days. Remove & Discard patch within 24 hours or as directed by MD 30 patch 0  . metFORMIN (GLUCOPHAGE) 1000 MG tablet Take 1,000 mg by mouth 2 (two) times daily with a meal.     . metoprolol tartrate (LOPRESSOR) 25 MG tablet Take 0.5 tablets (12.5 mg total) by mouth 2 (two) times daily. 90 tablet 1  . Multiple Vitamins-Minerals (CENTRUM SILVER) tablet Take 1 tablet by mouth daily.    . potassium chloride (KLOR-CON) 10 MEQ tablet Take 3 tablets (30 mEq total) by mouth daily. 60 tablet 1  . Skin Protectants, Misc. (EUCERIN)  cream Apply topically daily. (Patient taking differently: Apply 1 application topically daily as needed for dry skin. ) 454 g 0  . triamcinolone cream (KENALOG) 0.1 % Apply 1 application topically 2 (two) times daily. (Patient taking differently: Apply 1 application topically 2 (two) times daily as needed (rash). ) 30 g 0  . TRULICITY 0.09 FG/1.8EX SOPN Inject 0.75 mg into the skin once a week.    . lidocaine (LIDODERM) 5 % Place 2 patches onto the skin every 12 (twelve) hours. Remove & Discard patch within 12 hours or as directed by MD 30 patch 2   No current facility-administered medications for this visit.      Physical Exam:   BP 135/79 (BP Location: Left Arm, Patient Position: Sitting, Cuff Size: Normal)   Pulse (!) 58   Resp 18   Ht 5\' 7"  (1.702 m)   Wt 81.4 kg   SpO2 95% Comment: RA  BMI 28.10 kg/m   General:  Well-appearing, NAD  Chest:   cta  CV:   rrr  Incisions:  Healing well; staples removed from LUE and wound is steri-stripped  Abdomen:  sntnd  Extremities:  Mild edema  Diagnostic Tests:  CXR with clear lung fields   Impression:  Doing well after CABG surgery  Plan:  Continue to observe sternal precautions F/u in 2 weeks for repeat examinatino   I spent in excess of 20 minutes during the conduct of this office consultation and >50% of this time involved direct face-to-face encounter with the patient for counseling and/or coordination of their care.  Level 2                 10 minutes Level 3                 15 minutes Level 4                 25 minutes Level 5                 40 minutes  B. Murvin Natal, MD 11/26/2019 11:13 AM

## 2019-11-26 NOTE — Progress Notes (Signed)
Patient arrived from home via admissions wheelchair. A/O, VVS, oriented to unit (recent pt 10/29/19 CABG x 3), placed supplemental oxygen for SOB (92% RA), CHG bath and CCMD called and verified. Pt complains of left shoulder pain and limited mobility in joint. Pt states this was noted on previous admission and he was given lidocaine patch. Payton Emerald, RN

## 2019-11-26 NOTE — H&P (Signed)
[address]       [place], [address]             [phone]        [name] [facility] Medical Record #[id number] Date of Birth: 1960/10/08  Referring: No ref. provider found Primary Care: Concepcion Elk, MD Primary Cardiologist:Brian Stanford Breed, MD  Chief Complaint:   No chief complaint on file. left shoulder/upper chest pain  History of Present Illness:     59 yo man with longstanding DM and CAD presented with decompensated heart failure in early July. He was managed medically and underwent repeat LHC showing severe multivessel CAD and depressed LF function. He was referred for CABG and underwent CABG x 3 on 10/29/19. He did well postoperatively and was seen in the office Monday doing well. He returns today with lower sternal drainage. Denies f/c. Denies sternal click.   Current Activity/ Functional Status: Patient will be independent with mobility/ambulation, transfers, ADL's, IADL's.   Zubrod Score: At the time of surgery this patient's most appropriate activity status/level should be described as: []     0    Normal activity, no symptoms []     1    Restricted in physical strenuous activity but ambulatory, able to do out light work []     2    Ambulatory and capable of self care, unable to do work activities, up and about                 more than 50%  Of the time                            []     3    Only limited self care, in bed greater than 50% of waking hours []     4    Completely disabled, no self care, confined to bed or chair []     5    Moribund  Past Medical History:  Diagnosis Date  . Anxiety   . CAD (coronary artery disease)   . Chronic back pain   . Colon polyps   . Diabetes mellitus   . Heart attack ([place])   . Hyperlipidemia   . Hypertension   . Panic attacks   . PVD (peripheral vascular disease) ([place])     Past Surgical History:  Procedure Laterality Date  . CORONARY ANGIOPLASTY WITH STENT PLACEMENT    . CORONARY ARTERY  BYPASS GRAFT N/A 10/29/2019   Procedure: CORONARY ARTERY BYPASS GRAFTING (CABG) using LIMA to LAD; RIMA to PDA; and Left radial harvest vein to Circ.;  Surgeon: Wonda Olds, MD;  Location: [facility];  Service: Open Heart Surgery;  Laterality: N/A;  . EAR CYST EXCISION  12/10/2011   Procedure: CYST REMOVAL;  Surgeon: Gae Bon, DDS;  Location: [facility];  Service: Oral Surgery;  Laterality: Left;  Left Mandible Cyst Removal  . IR THORACENTESIS ASP PLEURAL SPACE W/IMG GUIDE  10/27/2019  . MULTIPLE EXTRACTIONS WITH ALVEOLOPLASTY  12/10/2011   Procedure: MULTIPLE EXTRACION WITH ALVEOLOPLASTY;  Surgeon: Gae Bon, DDS;  Location: [facility];  Service: Oral Surgery;  Laterality: N/A;  Right Maxillary Tuberosity Reduction; Right  Maxillary Buccal Exostosis; Bilateral Mandibular Tori   . PERIPHERAL VASCULAR CATHETERIZATION N/A 12/28/2014   Procedure: Abdominal Aortogram;  Surgeon: Serafina Mitchell, MD;  Location: [facility];  Service: Cardiovascular;  Laterality: N/A;  . PERIPHERAL VASCULAR CATHETERIZATION N/A 12/20/2015  Procedure: Abdominal Aortogram;  Surgeon: Serafina Mitchell, MD;  Location: [facility];  Service: Cardiovascular;  Laterality: N/A;  . PERIPHERAL VASCULAR CATHETERIZATION  12/20/2015   Procedure: Peripheral Vascular Balloon Angioplasty;  Surgeon: Serafina Mitchell, MD;  Location: [facility];  Service: Cardiovascular;;  . RADIAL ARTERY HARVEST Left 10/29/2019   Procedure: RADIAL ARTERY HARVEST;  Surgeon: Wonda Olds, MD;  Location: [facility];  Service: Open Heart Surgery;  Laterality: Left;  . RIGHT/LEFT HEART CATH AND CORONARY ANGIOGRAPHY N/A 10/28/2019   Procedure: RIGHT/LEFT HEART CATH AND CORONARY ANGIOGRAPHY;  Surgeon: Burnell Blanks, MD;  Location: [facility];  Service: Cardiovascular;  Laterality: N/A;  . TEE WITHOUT CARDIOVERSION N/A 10/29/2019   Procedure: TRANSESOPHAGEAL ECHOCARDIOGRAM (TEE);  Surgeon: Wonda Olds, MD;  Location: [facility];  Service:  Open Heart Surgery;  Laterality: N/A;    Social History   Tobacco Use  Smoking Status Former Smoker  . Packs/day: 0.25  . Years: 25.00  . Pack years: 6.25  . Types: Cigarettes  Smokeless Tobacco Never Used  Tobacco Comment   1/4 pack per day    Social History   Substance and Sexual Activity  Alcohol Use No  . Alcohol/week: 0.0 standard drinks     Allergies  Allergen Reactions  . Codeine Shortness Of Breath and Itching  . Gabapentin Other (See Comments)    Suicidal thoughts, extreme dreams  . Pregabalin     Suicidal thoughts,extreme dreams   . Simvastatin     Pain in joints  . Hydrocodone Nausea Only and Rash    Breathing problems   . Lisinopril Itching and Rash  . Venlafaxine Rash    No current facility-administered medications for this encounter.    Medications Prior to Admission  Medication Sig Dispense Refill Last Dose  . acetaminophen (TYLENOL) 500 MG tablet Take 1,000 mg by mouth every 6 (six) hours as needed for headache.     [name] Kitchen amiodarone (PACERONE) 200 MG tablet Take 1 tablet (200 mg total) by mouth daily. 90 tablet 1   . apixaban (ELIQUIS) 5 MG TABS tablet Take 1 tablet (5 mg total) by mouth 2 (two) times daily. 60 tablet 1   . atorvastatin (LIPITOR) 40 MG tablet Take 1 tablet (40 mg total) by mouth daily. 30 tablet 1   . clopidogrel (PLAVIX) 75 MG tablet Take 1 tablet (75 mg total) by mouth daily. 30 tablet 1   . empagliflozin (JARDIANCE) 25 MG TABS tablet Take 25 mg by mouth daily.     . furosemide (LASIX) 40 MG tablet Take 1 tablet (40 mg total) by mouth daily. 60 tablet 1   . isosorbide dinitrate (ISORDIL TITRADOSE) 5 MG tablet Take 2 tablets (10 mg total) by mouth 2 (two) times daily. 80 tablet 0   . lidocaine (LIDODERM) 5 % Place 2 patches onto the skin daily for 15 days. Remove & Discard patch within 24 hours or as directed by MD 30 patch 0   . lidocaine (LIDODERM) 5 % Place 2 patches onto the skin every 12 (twelve) hours. Remove & Discard patch  within 12 hours or as directed by MD 30 patch 2   . metFORMIN (GLUCOPHAGE) 1000 MG tablet Take 1,000 mg by mouth 2 (two) times daily with a meal.      . metoprolol tartrate (LOPRESSOR) 25 MG tablet Take 0.5 tablets (12.5 mg total) by mouth 2 (two) times daily. 90 tablet 1   . Multiple Vitamins-Minerals (CENTRUM SILVER)  tablet Take 1 tablet by mouth daily.     . potassium chloride (KLOR-CON) 10 MEQ tablet Take 3 tablets (30 mEq total) by mouth daily. 60 tablet 1   . Skin Protectants, Misc. (EUCERIN) cream Apply topically daily. (Patient taking differently: Apply 1 application topically daily as needed for dry skin. ) 454 g 0   . triamcinolone cream (KENALOG) 0.1 % Apply 1 application topically 2 (two) times daily. (Patient taking differently: Apply 1 application topically 2 (two) times daily as needed (rash). ) 30 g 0   . TRULICITY 0.93 AT/5.5DD SOPN Inject 0.75 mg into the skin once a week.       Family History  Problem Relation Age of Onset  . Diabetes Father   . Kidney failure Father   . Hypertension Father   . Hyperlipidemia Father   . Heart disease Father      Review of Systems:   ROS A comprehensive review of systems was negative.     Cardiac Review of Systems: Y or  [    ]= no  Chest Pain [    ]  Resting SOB [   ] Exertional SOB  [  ]  Orthopnea [  ]   Pedal Edema [   ]    Palpitations [  ] Syncope  [  ]   Presyncope [   ]  General Review of Systems: [Y] = yes [  ]=no Constitional: recent weight change [  ]; anorexia [  ]; fatigue [  ]; nausea [  ]; night sweats [  ]; fever [  ]; or chills [  ]                                                               Dental: Last Dentist visit:   Eye : blurred vision [  ]; diplopia [   ]; vision changes [  ];  Amaurosis fugax[  ]; Resp: cough [  ];  wheezing[  ];  hemoptysis[  ]; shortness of breath[  ]; paroxysmal nocturnal dyspnea[  ]; dyspnea on exertion[  ]; or orthopnea[  ];  GI:  gallstones[  ], vomiting[  ];  dysphagia[  ]; melena[   ];  hematochezia [  ]; heartburn[  ];   Hx of  Colonoscopy[  ]; GU: kidney stones [  ]; hematuria[  ];   dysuria [  ];  nocturia[  ];  history of     obstruction [  ]; urinary frequency [  ]             Skin: rash, swelling[  ];, hair loss[  ];  peripheral edema[  ];  or itching[  ]; Musculosketetal: myalgias[  ];  joint swelling[  ];  joint erythema[  ];  joint pain[  ];  back pain[  ];  Heme/Lymph: bruising[  ];  bleeding[  ];  anemia[  ];  Neuro: TIA[  ];  headaches[  ];  stroke[  ];  vertigo[  ];  seizures[  ];   paresthesias[  ];  difficulty walking[  ];  Psych:depression[  ]; anxiety[  ];  Endocrine: diabetes[  ];  thyroid dysfunction[  ];              Physical  Exam: There were no vitals taken for this visit.   General appearance: alert and cooperative Head: Normocephalic, without obvious abnormality, atraumatic Resp: clear to auscultation bilaterally Cardio: regular rate and rhythm, S1, S2 normal, no murmur, click, rub or gallop GI: soft, non-tender; bowel sounds normal; no masses,  no organomegaly Extremities: extremities normal, atraumatic, no cyanosis or edema Neurologic: Alert and oriented X 3, normal strength and tone. Normal symmetric reflexes. Normal coordination and gait Sternal incision slightly separated inferiorly with mild surrounding erythema.  Diagnostic Studies & Laboratory data:     Recent Radiology Findings:   DG Chest 2 View  Result Date: 11/26/2019 CLINICAL DATA:  Status post coronary bypass graft EXAM: CHEST - 2 VIEW COMPARISON:  11/23/2019 FINDINGS: Cardiac shadow is stable. Postsurgical changes are again seen. Patchy bibasilar opacities are seen left greater than right but stable from the prior study. Small effusions are noted bilaterally. IMPRESSION: Small effusions and bibasilar atelectasis left greater than right. Electronically Signed   By: Inez Catalina M.D.   On: 11/26/2019 13:23     I have independently reviewed the above radiologic studies and  discussed with the patient    Assessment / Plan:       Admit for IV abx; OR in the morning for vac placement. NPO p MN   I  spent 30 minutes counseling the patient face to face.  Jermaine Tholl Z. Orvan Seen, [place] 11/26/2019 4:03 PM

## 2019-11-26 NOTE — Progress Notes (Signed)
Called PA concerning elevated glucose level with lab work. Order placed for SSI. Spoke with patient concerning elevated glucose levels. Pt ordered large brownie with meal and aware this will increase more. Educated.

## 2019-11-26 NOTE — Patient Instructions (Signed)
Patient is instructed to return home until admitting and calls him for admission.  He will be admitted for surgical debridement and VAC placement

## 2019-11-26 NOTE — Progress Notes (Signed)
Pharmacy Antibiotic Note  [name] is a 59 y.o. male admitted on 11/26/2019 with sternal wound infection.  Pharmacy has been consulted for Vanco dosing.  CC/HPI:  sternal wound infetion  PMH: DM, CAD, anxiety, chronic back pain, HLD, HTN, panic attacks, PVD  Significant events: Admitted with CHF 10/2019 and underwent repeat LHC showing severe multivessel CAD and depressed LF function. He was referred for CABG and underwent CABG x 3 on 10/29/19.   ID: Sternal wound infection s/p cABG 10/29/19. Afebrile. Previous Scr 1.19 . Vanco 8/5>>   Plan: OR in AM for vac placement Vanco 1g IV x 1 then 750mg  IV q12 hr. Trough after 3-5 doses at steady state. F/u cultures.    Height: 5\' 7"  (170.2 cm) Weight: 79.6 kg (175 lb 8 oz) IBW/kg (Calculated) : 66.1  Temp (24hrs), Avg:98.2 F (36.8 C), Min:98.1 F (36.7 C), Max:98.3 F (36.8 C)  No results for input(s): WBC, CREATININE, LATICACIDVEN, VANCOTROUGH, VANCOPEAK, VANCORANDOM, GENTTROUGH, GENTPEAK, GENTRANDOM, TOBRATROUGH, TOBRAPEAK, TOBRARND, AMIKACINPEAK, AMIKACINTROU, AMIKACIN in the last 168 hours.  Estimated Creatinine Clearance: 69.3 mL/min (by C-G formula based on SCr of 1.19 mg/dL).    Allergies  Allergen Reactions  . Codeine Shortness Of Breath and Itching  . Gabapentin Other (See Comments)    Suicidal thoughts, extreme dreams  . Pregabalin     Suicidal thoughts,extreme dreams   . Simvastatin     Pain in joints  . Hydrocodone Nausea Only and Rash    Breathing problems   . Lisinopril Itching and Rash  . Venlafaxine Rash    [name] S. Alford Highland, PharmD, BCPS Clinical Staff Pharmacist [email or web address]  Wayland Salinas 11/26/2019 4:42 PM

## 2019-11-26 NOTE — Progress Notes (Signed)
Subjective:  The patient is a 59 year old male status post recent CABG with bilateral internal mammary artery and left radial conduits CARDIOTHORACIC SURGERY OPERATIVE NOTE  Date of Procedure:    10/29/19 Preoperative Diagnosis:      Severe 3-vessel Coronary Artery Disease with reduced LV function and clinical heart failure  Postoperative Diagnosis:    Same  Procedure:        Coronary Artery Bypass Grafting x 3              Left Internal Mammary Artery to Distal Left Anterior Descending Coronary Artery, pedicled RIMA Graft to Posterior Descending Coronary Artery; left radial artery Graft to Obtuse Marginal Branch of Left Circumflex Coronary Artery Open left radial artery Harvest  Bilateral internal mammary artery harvesting  Surgeon:        B. Murvin Natal, MD  Assistant:       Ahmed Prima, E PA-C  Anesthesia:    get  Operative Findings: ? Reduced left ventricular systolic function ? good quality  internal mammary artery conduits ? Good quality left radial artery conduit ? Fair quality target vessels for grafting  He called our office this morning describing some lower sternal drainage.  He denies fevers, chills or other significant constitutional symptoms.  He does have some mild occasional shortness of breath.  He is also having some minor lower extremity edema.  This is somewhat chronic.  In general he feels fair and relates a lot of his discomforts to the sternal incision.    Patient Active Problem List   Diagnosis Date Noted   Sternal wound dehiscence 11/26/2019   Sternal wound infection 11/26/2019   Hx of CABG 11/23/2019   Ischemic cardiomyopathy 11/23/2019   PAF (paroxysmal atrial fibrillation) ([place]) 11/23/2019   PVD (peripheral vascular disease) ([place]) 11/23/2019   Chronic back pain 11/23/2019   Prolonged Q-T interval on ECG 11/23/2019   Stenosis of right carotid artery    Pleural effusion 10/26/2019   Acute combined systolic and diastolic heart failure  ([place]) 10/25/2019   Acute congestive heart failure ([place])    Superficial bacterial skin infection 06/16/2018   Bacterial skin infection 06/16/2018   CAD S/P percutaneous coronary angioplasty 08/29/2011   Tobacco abuse 08/29/2011   Hypertension 08/29/2011   Hyperlipidemia 08/29/2011   Diabetes mellitus ([place]) 08/29/2011   Past Medical History:  Diagnosis Date   Anxiety    CAD (coronary artery disease)    Chronic back pain    Colon polyps    Diabetes mellitus    Heart attack ([place])    Hyperlipidemia    Hypertension    Panic attacks    PVD (peripheral vascular disease) ([place])     Past Surgical History:  Procedure Laterality Date   CORONARY ANGIOPLASTY WITH STENT PLACEMENT     CORONARY ARTERY BYPASS GRAFT N/A 10/29/2019   Procedure: CORONARY ARTERY BYPASS GRAFTING (CABG) using LIMA to LAD; RIMA to PDA; and Left radial harvest vein to Circ.;  Surgeon: Wonda Olds, MD;  Location: [facility];  Service: Open Heart Surgery;  Laterality: N/A;   EAR CYST EXCISION  12/10/2011   Procedure: CYST REMOVAL;  Surgeon: Gae Bon, DDS;  Location: [facility];  Service: Oral Surgery;  Laterality: Left;  Left Mandible Cyst Removal   IR THORACENTESIS ASP PLEURAL SPACE W/IMG GUIDE  10/27/2019   MULTIPLE EXTRACTIONS WITH ALVEOLOPLASTY  12/10/2011   Procedure: MULTIPLE EXTRACION WITH ALVEOLOPLASTY;  Surgeon: Gae Bon, DDS;  Location: [facility];  Service: Oral Surgery;  Laterality: N/A;  Right Maxillary Tuberosity Reduction; Right  Maxillary Buccal Exostosis; Bilateral Mandibular Tori    PERIPHERAL VASCULAR CATHETERIZATION N/A 12/28/2014   Procedure: Abdominal Aortogram;  Surgeon: Serafina Mitchell, MD;  Location: [facility];  Service: Cardiovascular;  Laterality: N/A;   PERIPHERAL VASCULAR CATHETERIZATION N/A 12/20/2015   Procedure: Abdominal Aortogram;  Surgeon: Serafina Mitchell, MD;  Location: [facility];  Service: Cardiovascular;  Laterality: N/A;   PERIPHERAL VASCULAR  CATHETERIZATION  12/20/2015   Procedure: Peripheral Vascular Balloon Angioplasty;  Surgeon: Serafina Mitchell, MD;  Location: [facility];  Service: Cardiovascular;;   RADIAL ARTERY HARVEST Left 10/29/2019   Procedure: RADIAL ARTERY HARVEST;  Surgeon: Wonda Olds, MD;  Location: [facility];  Service: Open Heart Surgery;  Laterality: Left;   RIGHT/LEFT HEART CATH AND CORONARY ANGIOGRAPHY N/A 10/28/2019   Procedure: RIGHT/LEFT HEART CATH AND CORONARY ANGIOGRAPHY;  Surgeon: Burnell Blanks, MD;  Location: [facility];  Service: Cardiovascular;  Laterality: N/A;   TEE WITHOUT CARDIOVERSION N/A 10/29/2019   Procedure: TRANSESOPHAGEAL ECHOCARDIOGRAM (TEE);  Surgeon: Wonda Olds, MD;  Location: [facility];  Service: Open Heart Surgery;  Laterality: N/A;    (Not in a hospital admission)  Physical Exam  Constitutional: No distress. He appears acutely ill.  HENT:  Mouth/Throat: Pharynx is normal.  Some tongue exudate, yellowish [place]  Eyes: Pupils are equal, round, and reactive to light. Conjunctivae are normal.  Neck: Thyroid normal. No JVD present. No neck adenopathy. No thyromegaly present.  Pulmonary/Chest: He has no wheezes. He has no rales. Sternal tenderness   Diminished in bases- mildly L>R  Abdominal: Soft. Bowel sounds are normal. He exhibits no distension and no mass. There is no hepatomegaly. There is no abdominal tenderness.  Musculoskeletal:        General: Edema present. No tenderness or deformity.     Cervical back: Normal range of motion and neck supple.  Neurological: He is alert and oriented to person, place, and time. He has normal motor skills. Gait normal.  Skin: Skin is warm and dry. No rash noted. No cyanosis. No pallor.      Review of Systems  Constitutional: Positive for malaise/fatigue. Negative for diaphoresis, fever and weight loss.  HENT: Negative.   Respiratory: Positive for shortness of breath. Negative for cough, hemoptysis, sputum production  and wheezing.   Cardiovascular: Positive for leg swelling. Negative for palpitations, orthopnea and PND.       Sternal discomfort, different from "heart attack" pain  Gastrointestinal: Negative for abdominal pain, blood in stool, constipation, diarrhea, heartburn, melena, nausea and vomiting.  Musculoskeletal: Positive for back pain.  Skin:       Break down of lower pole of sternal incision  Neurological: Negative.   Psychiatric/Behavioral: Negative.   Sternal wound shows necrotic fatty tissue.  There is some exudate.  I probed it with a Q-tip and it is deep to the bone.  No cellulitis.  No bone movement with cough.  Allergies  Allergen Reactions   Codeine Shortness Of Breath and Itching   Gabapentin Other (See Comments)    Suicidal thoughts, extreme dreams   Pregabalin     Suicidal thoughts,extreme dreams    Simvastatin     Pain in joints   Hydrocodone Nausea Only and Rash    Breathing problems    Lisinopril Itching and Rash   Venlafaxine Rash      Current Meds  Medication Sig   acetaminophen (TYLENOL) 500 MG tablet Take 1,000  mg by mouth every 6 (six) hours as needed for headache.   amiodarone (PACERONE) 200 MG tablet Take 1 tablet (200 mg total) by mouth daily.   apixaban (ELIQUIS) 5 MG TABS tablet Take 1 tablet (5 mg total) by mouth 2 (two) times daily.   atorvastatin (LIPITOR) 40 MG tablet Take 1 tablet (40 mg total) by mouth daily.   clopidogrel (PLAVIX) 75 MG tablet Take 1 tablet (75 mg total) by mouth daily.   empagliflozin (JARDIANCE) 25 MG TABS tablet Take 25 mg by mouth daily.   furosemide (LASIX) 40 MG tablet Take 1 tablet (40 mg total) by mouth daily.   isosorbide dinitrate (ISORDIL TITRADOSE) 5 MG tablet Take 2 tablets (10 mg total) by mouth 2 (two) times daily.   lidocaine (LIDODERM) 5 % Place 2 patches onto the skin daily for 15 days. Remove & Discard patch within 24 hours or as directed by MD   lidocaine (LIDODERM) 5 % Place 2 patches onto the  skin every 12 (twelve) hours. Remove & Discard patch within 12 hours or as directed by MD   metFORMIN (GLUCOPHAGE) 1000 MG tablet Take 1,000 mg by mouth 2 (two) times daily with a meal.    metoprolol tartrate (LOPRESSOR) 25 MG tablet Take 0.5 tablets (12.5 mg total) by mouth 2 (two) times daily.   Multiple Vitamins-Minerals (CENTRUM SILVER) tablet Take 1 tablet by mouth daily.   potassium chloride (KLOR-CON) 10 MEQ tablet Take 3 tablets (30 mEq total) by mouth daily.   Skin Protectants, Misc. (EUCERIN) cream Apply topically daily. (Patient taking differently: Apply 1 application topically daily as needed for dry skin. )   triamcinolone cream (KENALOG) 0.1 % Apply 1 application topically 2 (two) times daily. (Patient taking differently: Apply 1 application topically 2 (two) times daily as needed (rash). )   TRULICITY 9.51 OA/4.1YS SOPN Inject 0.75 mg into the skin once a week.     Social History   Socioeconomic History   Marital status: Legally Separated    Spouse name: Not on file   Number of children: 3   Years of education: Not on file   Highest education level: Not on file  Occupational History    Comment: Unemployed  Tobacco Use   Smoking status: Former Smoker    Packs/day: 0.25    Years: 25.00    Pack years: 6.25    Types: Cigarettes   Smokeless tobacco: Never Used   Tobacco comment: 1/4 pack per day  Vaping Use   Vaping Use: Never used  Substance and Sexual Activity   Alcohol use: No    Alcohol/week: 0.0 standard drinks   Drug use: No   Sexual activity: Not on file  Other Topics Concern   Not on file  Social History Narrative   Not on file   Social Determinants of Health   Financial Resource Strain:    Difficulty of Paying Living Expenses:   Food Insecurity:    Worried About Charity fundraiser in the Last Year:    Arboriculturist in the Last Year:   Transportation Needs:    Film/video editor (Medical):    Lack of Transportation  (Non-Medical):   Physical Activity: Insufficiently Active   Days of Exercise per Week: 1 day   Minutes of Exercise per Session: 30 min  Stress:    Feeling of Stress :   Social Connections: Socially Isolated   Frequency of Communication with Friends and Family: More than three times a week  Frequency of Social Gatherings with Friends and Family: More than three times a week   Attends Religious Services: Never   Marine scientist or Organizations: No   Attends Archivist Meetings: Never   Marital Status: Divorced  Human resources officer Violence:    Fear of Current or Ex-Partner:    Emotionally Abused:    Physically Abused:    Sexually Abused:     A/P sternal wound infection Discussed findings with Dr. Orvan Seen and patient will be admitted for debridement of sternal wound/exploration with probable VAC placement.

## 2019-11-26 NOTE — Progress Notes (Signed)
Current Outpatient Medications  Medication Instructions  . acetaminophen (TYLENOL) 1,000 mg, Oral, Every 6 hours PRN  . amiodarone (PACERONE) 200 mg, Oral, Daily  . apixaban (ELIQUIS) 5 mg, Oral, 2 times daily  . atorvastatin (LIPITOR) 40 mg, Oral, Daily  . clopidogrel (PLAVIX) 75 mg, Oral, Daily  . empagliflozin (JARDIANCE) 25 mg, Oral, Daily  . furosemide (LASIX) 40 mg, Oral, Daily  . isosorbide dinitrate (ISORDIL TITRADOSE) 10 mg, Oral, 2 times daily  . lidocaine (LIDODERM) 5 % 2 patches, Transdermal, Every 24 hours, Remove & Discard patch within 24 hours or as directed by MD  . lidocaine (LIDODERM) 5 % 2 patches, Transdermal, Every 12 hours, Remove & Discard patch within 12 hours or as directed by MD  . metFORMIN (GLUCOPHAGE) 1,000 mg, Oral, 2 times daily with meals  . metoprolol tartrate (LOPRESSOR) 12.5 mg, Oral, 2 times daily  . Multiple Vitamins-Minerals (CENTRUM SILVER) tablet 1 tablet, Oral, Daily  . potassium chloride (KLOR-CON) 10 MEQ tablet 30 mEq, Oral, Daily  . Skin Protectants, Misc. (EUCERIN) cream Topical, Daily  . triamcinolone cream (KENALOG) 0.1 % 1 application, Topical, 2 times daily  . Trulicity 4.00 mg, Subcutaneous, Weekly

## 2019-11-27 ENCOUNTER — Encounter (HOSPITAL_COMMUNITY): Payer: Self-pay | Admitting: Cardiothoracic Surgery

## 2019-11-27 ENCOUNTER — Inpatient Hospital Stay (HOSPITAL_COMMUNITY): Payer: Medicaid Other | Admitting: Certified Registered Nurse Anesthetist

## 2019-11-27 ENCOUNTER — Encounter (HOSPITAL_COMMUNITY)
Admission: AD | Disposition: A | Discharge status: Home Health | Payer: Self-pay | Source: Ambulatory Visit | Attending: Cardiothoracic Surgery

## 2019-11-27 DIAGNOSIS — I9789 Other postprocedural complications and disorders of the circulatory system, not elsewhere classified: Secondary | ICD-10-CM

## 2019-11-27 HISTORY — PX: APPLICATION OF WOUND VAC: SHX5189

## 2019-11-27 HISTORY — PX: WOUND EXPLORATION: SHX6188

## 2019-11-27 LAB — GLUCOSE, CAPILLARY
Glucose-Capillary: 165 mg/dL — ABNORMAL HIGH (ref 70–99)
Glucose-Capillary: 137 mg/dL — ABNORMAL HIGH (ref 70–99)
Glucose-Capillary: 115 mg/dL — ABNORMAL HIGH (ref 70–99)
Glucose-Capillary: 313 mg/dL — ABNORMAL HIGH (ref 70–99)
Glucose-Capillary: 397 mg/dL — ABNORMAL HIGH (ref 70–99)

## 2019-11-27 SURGERY — WOUND EXPLORATION
Anesthesia: General | Site: Chest

## 2019-11-27 MED ORDER — CHLORHEXIDINE GLUCONATE 0.12 % MT SOLN
15.0000 mL | OROMUCOSAL | Status: AC
  Administered 2019-11-27: 15 mL via OROMUCOSAL
  Filled 2019-11-27: qty 15

## 2019-11-27 MED ORDER — ACETAMINOPHEN 160 MG/5ML PO SOLN: 1000.0000 mg | Freq: Four times a day (QID) | ORAL | Status: AC

## 2019-11-27 MED ORDER — MIDAZOLAM HCL 5 MG/5ML IJ SOLN
INTRAMUSCULAR | Status: DC | PRN
  Administered 2019-11-27: 1 mg via INTRAVENOUS

## 2019-11-27 MED ORDER — FENTANYL CITRATE (PF) 100 MCG/2ML IJ SOLN
25.0000 ug | INTRAMUSCULAR | Status: DC | PRN
  Administered 2019-11-30 – 2019-12-02 (×16): 25 ug via INTRAVENOUS
  Filled 2019-11-27 (×13): qty 2

## 2019-11-27 MED ORDER — METOPROLOL TARTRATE 12.5 MG HALF TABLET: 12.5000 mg | ORAL_TABLET | Freq: Two times a day (BID) | ORAL | Status: DC

## 2019-11-27 MED ORDER — HYDROCERIN EX CREA
TOPICAL_CREAM | Freq: Every day | CUTANEOUS | Status: DC | PRN
  Filled 2019-11-27: qty 113

## 2019-11-27 MED ORDER — KETOROLAC TROMETHAMINE 15 MG/ML IJ SOLN
15.0000 mg | Freq: Four times a day (QID) | INTRAMUSCULAR | Status: AC
  Administered 2019-11-27 – 2019-11-29 (×11): 15 mg via INTRAVENOUS
  Filled 2019-11-27 (×11): qty 1

## 2019-11-27 MED ORDER — APIXABAN 5 MG PO TABS
5.0000 mg | ORAL_TABLET | Freq: Two times a day (BID) | ORAL | Status: DC
  Administered 2019-11-27 – 2019-12-18 (×42): 5 mg via ORAL
  Filled 2019-11-27 (×42): qty 1

## 2019-11-27 MED ORDER — SUGAMMADEX SODIUM 200 MG/2ML IV SOLN
INTRAVENOUS | Status: DC | PRN
  Administered 2019-11-27: 150 mg via INTRAVENOUS

## 2019-11-27 MED ORDER — ONDANSETRON HCL 4 MG/2ML IJ SOLN
INTRAMUSCULAR | Status: DC | PRN
  Administered 2019-11-27: 4 mg via INTRAVENOUS

## 2019-11-27 MED ORDER — CLOPIDOGREL BISULFATE 75 MG PO TABS: 75.0000 mg | ORAL_TABLET | Freq: Every day | ORAL | Status: DC

## 2019-11-27 MED ORDER — METFORMIN HCL 500 MG PO TABS
1000.0000 mg | ORAL_TABLET | Freq: Two times a day (BID) | ORAL | Status: DC
  Administered 2019-11-28 – 2019-12-18 (×34): 1000 mg via ORAL
  Filled 2019-11-27 (×35): qty 2

## 2019-11-27 MED ORDER — ISOSORBIDE DINITRATE 10 MG PO TABS: 10.0000 mg | ORAL_TABLET | Freq: Two times a day (BID) | ORAL | Status: DC

## 2019-11-27 MED ORDER — PHENYLEPHRINE HCL (PRESSORS) 10 MG/ML IV SOLN
INTRAVENOUS | Status: DC | PRN
Start: 2019-11-27 — End: 2019-11-27
  Administered 2019-11-27: 80 ug via INTRAVENOUS
  Administered 2019-11-27: 120 ug via INTRAVENOUS

## 2019-11-27 MED ORDER — LIDOCAINE 5 % EX PTCH
2.0000 | MEDICATED_PATCH | Freq: Two times a day (BID) | CUTANEOUS | Status: DC
  Administered 2019-11-27 – 2019-12-02 (×7): 2 via TRANSDERMAL
  Administered 2019-12-02: 1 via TRANSDERMAL
  Administered 2019-12-03 – 2019-12-05 (×5): 2 via TRANSDERMAL
  Administered 2019-12-05: 1 via TRANSDERMAL
  Administered 2019-12-06 – 2019-12-14 (×14): 2 via TRANSDERMAL
  Filled 2019-11-27 (×31): qty 2

## 2019-11-27 MED ORDER — CHLORHEXIDINE GLUCONATE 0.12 % MT SOLN
OROMUCOSAL | Status: AC
  Filled 2019-11-27: qty 15

## 2019-11-27 MED ORDER — SODIUM CHLORIDE 0.45 % IV SOLN: INTRAVENOUS | Status: DC

## 2019-11-27 MED ORDER — CENTRUM SILVER PO TABS: 1.0000 | ORAL_TABLET | Freq: Every day | ORAL | Status: DC

## 2019-11-27 MED ORDER — SENNOSIDES-DOCUSATE SODIUM 8.6-50 MG PO TABS
1.0000 | ORAL_TABLET | Freq: Every day | ORAL | Status: DC
  Administered 2019-11-27 – 2019-12-14 (×10): 1 via ORAL
  Filled 2019-11-27 (×16): qty 1

## 2019-11-27 MED ORDER — METFORMIN HCL 500 MG PO TABS: 1000.0000 mg | ORAL_TABLET | Freq: Two times a day (BID) | ORAL | Status: DC

## 2019-11-27 MED ORDER — ACETAMINOPHEN 500 MG PO TABS: 1000.0000 mg | ORAL_TABLET | Freq: Four times a day (QID) | ORAL | Status: DC | PRN

## 2019-11-27 MED ORDER — PHENYLEPHRINE HCL-NACL 10-0.9 MG/250ML-% IV SOLN
INTRAVENOUS | Status: DC | PRN
Start: 2019-11-27 — End: 2019-11-27
  Administered 2019-11-27: 50 ug/min via INTRAVENOUS

## 2019-11-27 MED ORDER — AMIODARONE HCL 200 MG PO TABS: 200.0000 mg | ORAL_TABLET | Freq: Every day | ORAL | Status: DC

## 2019-11-27 MED ORDER — DEXAMETHASONE SODIUM PHOSPHATE 10 MG/ML IJ SOLN
INTRAMUSCULAR | Status: DC | PRN
  Administered 2019-11-27: 5 mg via INTRAVENOUS

## 2019-11-27 MED ORDER — BISACODYL 5 MG PO TBEC
10.0000 mg | DELAYED_RELEASE_TABLET | Freq: Every day | ORAL | Status: DC
  Administered 2019-12-04 – 2019-12-17 (×5): 10 mg via ORAL
  Filled 2019-11-27 (×15): qty 2

## 2019-11-27 MED ORDER — MIDAZOLAM HCL 2 MG/2ML IJ SOLN
INTRAMUSCULAR | Status: AC
  Filled 2019-11-27: qty 2

## 2019-11-27 MED ORDER — FENTANYL CITRATE (PF) 250 MCG/5ML IJ SOLN
INTRAMUSCULAR | Status: DC | PRN
  Administered 2019-11-27: 50 ug via INTRAVENOUS

## 2019-11-27 MED ORDER — DULAGLUTIDE 0.75 MG/0.5ML ~~LOC~~ SOAJ: 0.7500 mg | SUBCUTANEOUS | Status: DC

## 2019-11-27 MED ORDER — TRIAMCINOLONE ACETONIDE 0.1 % EX CREA
1.0000 "application " | TOPICAL_CREAM | Freq: Two times a day (BID) | CUTANEOUS | Status: DC | PRN
  Filled 2019-11-27: qty 15

## 2019-11-27 MED ORDER — EUCERIN EX CREA: 1.0000 "application " | TOPICAL_CREAM | Freq: Every day | CUTANEOUS | Status: DC | PRN

## 2019-11-27 MED ORDER — ACETAMINOPHEN 10 MG/ML IV SOLN
INTRAVENOUS | Status: AC
  Filled 2019-11-27: qty 100

## 2019-11-27 MED ORDER — POTASSIUM CHLORIDE CRYS ER 20 MEQ PO TBCR
30.0000 meq | EXTENDED_RELEASE_TABLET | Freq: Every day | ORAL | Status: DC
  Administered 2019-11-27 – 2019-12-18 (×22): 30 meq via ORAL
  Filled 2019-11-27 (×22): qty 1

## 2019-11-27 MED ORDER — LIDOCAINE 2% (20 MG/ML) 5 ML SYRINGE
INTRAMUSCULAR | Status: DC | PRN
  Administered 2019-11-27: 60 mg via INTRAVENOUS

## 2019-11-27 MED ORDER — LACTATED RINGERS IV SOLN: INTRAVENOUS | Status: DC | PRN

## 2019-11-27 MED ORDER — FUROSEMIDE 40 MG PO TABS
40.0000 mg | ORAL_TABLET | Freq: Every day | ORAL | Status: DC
  Administered 2019-11-27 – 2019-12-18 (×22): 40 mg via ORAL
  Filled 2019-11-27 (×22): qty 1

## 2019-11-27 MED ORDER — ATORVASTATIN CALCIUM 40 MG PO TABS: 40.0000 mg | ORAL_TABLET | Freq: Every day | ORAL | Status: DC

## 2019-11-27 MED ORDER — EMPAGLIFLOZIN 25 MG PO TABS: 25.0000 mg | ORAL_TABLET | Freq: Every day | ORAL | Status: DC

## 2019-11-27 MED ORDER — ROCURONIUM BROMIDE 10 MG/ML (PF) SYRINGE
PREFILLED_SYRINGE | INTRAVENOUS | Status: DC | PRN
  Administered 2019-11-27: 40 mg via INTRAVENOUS

## 2019-11-27 MED ORDER — SODIUM CHLORIDE 0.9 % IR SOLN
Status: DC | PRN
  Administered 2019-11-27: 2000 mL

## 2019-11-27 MED ORDER — FENTANYL CITRATE (PF) 250 MCG/5ML IJ SOLN
INTRAMUSCULAR | Status: AC
  Filled 2019-11-27: qty 5

## 2019-11-27 MED ORDER — PROPOFOL 10 MG/ML IV BOLUS
INTRAVENOUS | Status: DC | PRN
  Administered 2019-11-27: 80 mg via INTRAVENOUS

## 2019-11-27 MED ORDER — ACETAMINOPHEN 500 MG PO TABS
1000.0000 mg | ORAL_TABLET | Freq: Four times a day (QID) | ORAL | Status: AC
  Administered 2019-11-27 – 2019-12-02 (×16): 1000 mg via ORAL
  Filled 2019-11-27 (×17): qty 2

## 2019-11-27 MED ORDER — LACTATED RINGERS IV SOLN: INTRAVENOUS | Status: DC

## 2019-11-27 MED ORDER — EPHEDRINE SULFATE 50 MG/ML IJ SOLN
INTRAMUSCULAR | Status: DC | PRN
  Administered 2019-11-27: 5 mg via INTRAVENOUS
  Administered 2019-11-27: 10 mg via INTRAVENOUS

## 2019-11-27 MED ORDER — ACETAMINOPHEN 10 MG/ML IV SOLN
INTRAVENOUS | Status: DC | PRN
Start: 2019-11-27 — End: 2019-11-27
  Administered 2019-11-27: 1000 mg via INTRAVENOUS

## 2019-11-27 SURGICAL SUPPLY — 68 items
APL SKNCLS STERI-STRIP NONHPOA (GAUZE/BANDAGES/DRESSINGS)
BAG DECANTER FOR FLEXI CONT (MISCELLANEOUS) ×1 IMPLANT
BANDAGE ESMARK 6X9 LF (GAUZE/BANDAGES/DRESSINGS) IMPLANT
BENZOIN TINCTURE PRP APPL 2/3 (GAUZE/BANDAGES/DRESSINGS) IMPLANT
BLADE CLIPPER SURG (BLADE) ×1 IMPLANT
BLADE SURG 10 STRL SS (BLADE) ×1 IMPLANT
BNDG CMPR 9X6 STRL LF SNTH (GAUZE/BANDAGES/DRESSINGS)
BNDG ESMARK 6X9 LF (GAUZE/BANDAGES/DRESSINGS)
BNDG GAUZE ELAST 4 BULKY (GAUZE/BANDAGES/DRESSINGS) IMPLANT
BRUSH SCRUB EZ PLAIN DRY (MISCELLANEOUS) ×4 IMPLANT
CANISTER SUCT 3000ML PPV (MISCELLANEOUS) ×3 IMPLANT
CANISTER WOUND CARE 500ML ATS (WOUND CARE) ×3 IMPLANT
CATH FOLEY 2WAY SLVR  5CC 16FR (CATHETERS)
CATH FOLEY 2WAY SLVR 5CC 16FR (CATHETERS) IMPLANT
CLIP VESOCCLUDE SM WIDE 24/CT (CLIP) IMPLANT
CNTNR URN SCR LID CUP LEK RST (MISCELLANEOUS) IMPLANT
CONT SPEC 4OZ STRL OR WHT (MISCELLANEOUS)
COVER SURGICAL LIGHT HANDLE (MISCELLANEOUS) ×4 IMPLANT
DRAPE INCISE IOBAN 66X45 STRL (DRAPES) ×2 IMPLANT
DRAPE LAPAROSCOPIC ABDOMINAL (DRAPES) ×3 IMPLANT
DRAPE WARM FLUID 44X44 (DRAPES) IMPLANT
DRSG AQUACEL AG ADV 3.5X14 (GAUZE/BANDAGES/DRESSINGS) ×1 IMPLANT
DRSG VAC ATS LRG SENSATRAC (GAUZE/BANDAGES/DRESSINGS) IMPLANT
DRSG VAC ATS MED SENSATRAC (GAUZE/BANDAGES/DRESSINGS) IMPLANT
DRSG VAC ATS SM SENSATRAC (GAUZE/BANDAGES/DRESSINGS) ×2 IMPLANT
ELECT CAUTERY BLADE 6.4 (BLADE) ×2 IMPLANT
ELECT REM PT RETURN 9FT ADLT (ELECTROSURGICAL) ×3
ELECTRODE REM PT RTRN 9FT ADLT (ELECTROSURGICAL) ×1 IMPLANT
GAUZE SPONGE 4X4 12PLY STRL (GAUZE/BANDAGES/DRESSINGS) ×1 IMPLANT
GAUZE XEROFORM 5X9 LF (GAUZE/BANDAGES/DRESSINGS) IMPLANT
GLOVE BIO SURGEON STRL SZ 6.5 (GLOVE) ×3 IMPLANT
GLOVE BIO SURGEON STRL SZ7.5 (GLOVE) ×2 IMPLANT
GLOVE BIO SURGEONS STRL SZ 6.5 (GLOVE) ×3
GLOVE BIOGEL PI IND STRL 7.5 (GLOVE) IMPLANT
GLOVE BIOGEL PI INDICATOR 7.5 (GLOVE) ×2
GLOVE NEODERM STRL 7.5  LF PF (GLOVE)
GLOVE NEODERM STRL 7.5 LF PF (GLOVE) ×1 IMPLANT
GLOVE SURG NEODERM 7.5  LF PF (GLOVE)
GOWN STRL REUS W/ TWL LRG LVL3 (GOWN DISPOSABLE) ×3 IMPLANT
GOWN STRL REUS W/TWL LRG LVL3 (GOWN DISPOSABLE) ×9
HANDPIECE INTERPULSE COAX TIP (DISPOSABLE)
HEMOSTAT POWDER SURGIFOAM 1G (HEMOSTASIS) IMPLANT
HEMOSTAT SURGICEL 2X14 (HEMOSTASIS) IMPLANT
KIT BASIN OR (CUSTOM PROCEDURE TRAY) ×3 IMPLANT
KIT TURNOVER KIT B (KITS) ×3 IMPLANT
NS IRRIG 1000ML POUR BTL (IV SOLUTION) ×5 IMPLANT
PACK GENERAL/GYN (CUSTOM PROCEDURE TRAY) ×3 IMPLANT
PAD ARMBOARD 7.5X6 YLW CONV (MISCELLANEOUS) ×6 IMPLANT
SET HNDPC FAN SPRY TIP SCT (DISPOSABLE) IMPLANT
SOL PREP POV-IOD 4OZ 10% (MISCELLANEOUS) ×2 IMPLANT
SOL PREP PROV IODINE SCRUB 4OZ (MISCELLANEOUS) ×2 IMPLANT
SPONGE LAP 18X18 RF (DISPOSABLE) ×1 IMPLANT
STAPLER VISISTAT 35W (STAPLE) IMPLANT
SUT ETHILON 3 0 FSL (SUTURE) IMPLANT
SUT PDS AB 1 CTX 36 (SUTURE) IMPLANT
SUT PROLENE 2 0 MH 48 (SUTURE) IMPLANT
SUT VIC AB 2-0 CT1 27 (SUTURE) ×3
SUT VIC AB 2-0 CT1 TAPERPNT 27 (SUTURE) IMPLANT
SUT VIC AB 2-0 CTX 27 (SUTURE) ×2 IMPLANT
SWAB COLLECTION DEVICE MRSA (MISCELLANEOUS) IMPLANT
SWAB CULTURE ESWAB REG 1ML (MISCELLANEOUS) ×2 IMPLANT
SWAB CULTURE LIQUID MINI MALE (MISCELLANEOUS) ×2 IMPLANT
SYR 5ML LL (SYRINGE) IMPLANT
SYR TOOMEY IRRIG 70ML (MISCELLANEOUS) ×3
SYRINGE TOOMEY IRRIG 70ML (MISCELLANEOUS) IMPLANT
TOWEL GREEN STERILE (TOWEL DISPOSABLE) ×3 IMPLANT
TOWEL GREEN STERILE FF (TOWEL DISPOSABLE) ×1 IMPLANT
WATER STERILE IRR 1000ML POUR (IV SOLUTION) ×3 IMPLANT

## 2019-11-27 NOTE — Anesthesia Procedure Notes (Signed)

## 2019-11-27 NOTE — Progress Notes (Signed)
      [address]       [place], [address]             [phone]      Mr. Burkett was admitted yesterday with a sternal wound infection. Planned to go to OR for debridement and VAC placement.  Dr. Orvan Seen is away attending to a family emergency and I have offered to to do the procedure for Mr. Sopko.  He had some questions re: the [facility] which I answered for him  Plan OR later today  Remo Lipps C. Roxan Hockey, MD [facility] [phone]

## 2019-11-27 NOTE — Discharge Summary (Addendum)
Physician Discharge Summary       [address]       [place] [address]             [phone]    Patient ID: [name] MRN: [id number] DOB/AGE: February 06, 1961 59 y.o.  Admit date: 11/26/2019 Discharge date: 12/18/2019  Admission Diagnoses: Sternal wound dehiscence  Discharge Diagnoses:  1. S/p multiple wound debridement and vac changes 2. 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 History of Diabetes mellitus 4. History of Hyperlipidemia 5. History of Hypertension 6. History of PVD (peripheral vascular disease) ([place]) 7. History of tobacco abuse 8. History of panic attacks 9. History of anxiety 10. History of colon polyps  Consults: None  Procedure (s):  1. Sternal wound exploration and placement of wound VAC by Dr. Prescott Gum on 11/27/2019. 2. Sternal wound debridement and wound VAC change by Dr. Orvan Seen on 12/01/2019. 3. Sternal wound debridement, wound VAC change, and application of A cell by Dr. Orvan Seen on 12/04/2019. 4. Sternal wound debridement and wound VAC change 12/09/2019 by Dr. Orvan Seen. 5. Wound VAC change by Dr. Orvan Seen on 12/14/2019.  History of Presenting Illness: This is a 60 yo man with longstanding DM and CAD presented with decompensated heart failure in early July. He was managed medically and underwent repeat LHC showing severe multivessel CAD and depressed LF function. He was referred for CABG and underwent CABG x 3 on 10/29/19. He did well postoperatively and was seen in the office Monday doing well. He returns today with lower sternal drainage. Denies f/c. Denies sternal click. Patient was admitted on 11/27/2019 in order to undergo sternal wound exploration and placement of wound VAC.  Brief Hospital Course:  Patient has remained afebrile and hemodynamically stable. Wound VAC changes will be done Monday, Wednesday, Friday. Sternal wound culture showed few Klebsiella Oxytoca. He was put on Ceftriaxone initially. Antibiotic was changed to Cefipime on  08/10 as has better sensitivity.  Wound VAC was changed on Monday and then after sternal wound debridement again on Tuesday. Patient had some bradycardia, first degree heart block so Amiodarone was stopped on 12/01/2019. He was more hypertensive so he was put on Losartan and this was titrated accordingly. Amlodipine was started on 08/20 as he was still hypertensive. Sternal wound VAC lost its suction the evening of 08/19. Wound VAC was changed at the bedside on 08/20. Patient's wound VAC had intermittent suction problems. Wound VAC was changed on 08/23 in the OR. Isordil was stopped 08/25 as he completed one month of therapy. Wound VAC was changed at bedside by Dr. Orvan Seen on 12/18/2019. Per Dr. Orvan Seen, IV Ceftriaxone will be stopped and patient will be given one week of oral Cipro. Per Dr. Orvan Seen, the patient is surgically stable for discharge today and will have wound VAC changed at office on Monday 12/21/2019.   Latest Vital Signs: Blood pressure (!) 164/77, pulse (!) 58, temperature 97.8 F (36.6 C), temperature source Oral, resp. rate 11, height 5' 7.01" (1.702 m), weight 76 kg, SpO2 97 %.  Physical Exam: Cardiovascular: RRR Pulmonary: Clear to auscultation bilaterally Abdomen: Soft, non tender, bowel sounds present. Extremities: No lower extremity edema. Wounds: Eschars on chest tube wounds. VAC on sternal wound   Discharge Condition: Stable and discharged to home.  Recent laboratory studies:  Lab Results  Component Value Date   WBC 11.2 (H) 12/02/2019   HGB 14.2 12/02/2019   HCT 45.0 12/02/2019   MCV 91.1 12/02/2019   PLT 347 12/02/2019  Lab Results  Component Value Date   NA 134 (L) 12/09/2019   K 4.8 12/09/2019   CL 96 (L) 12/09/2019   CO2 28 12/09/2019   CREATININE 1.02 12/09/2019   GLUCOSE 96 12/09/2019      Diagnostic Studies: DG Chest 2 View  Result Date: 11/26/2019 CLINICAL DATA:  Status post coronary bypass graft EXAM: CHEST - 2 VIEW COMPARISON:  11/23/2019  FINDINGS: Cardiac shadow is stable. Postsurgical changes are again seen. Patchy bibasilar opacities are seen left greater than right but stable from the prior study. Small effusions are noted bilaterally. IMPRESSION: Small effusions and bibasilar atelectasis left greater than right. Electronically Signed   By: Inez Catalina M.D.   On: 11/26/2019 13:23   DG Chest 2 View  Result Date: 11/23/2019 CLINICAL DATA:  Chest pain and shortness of breath. EXAM: CHEST - 2 VIEW COMPARISON:  November 18, 2019 FINDINGS: Multiple sternal wires are present. Mild, diffuse chronic appearing increased lung markings are noted. Mild atelectasis and/or infiltrate is seen within the left lung base. Small bilateral pleural effusions are seen. No pneumothorax is identified. The cardiac silhouette is mildly enlarged. The visualized skeletal structures are unremarkable. IMPRESSION: 1. Mild left basilar atelectasis and/or infiltrate. 2. Small bilateral pleural effusions. Electronically Signed   By: Virgina Norfolk M.D.   On: 11/23/2019 15:32   DG Chest 2 View  Result Date: 11/18/2019 CLINICAL DATA:  Short of breath. EXAM: CHEST - 2 VIEW COMPARISON:  None. FINDINGS: Status post median sternotomy and CABG procedure. Heart size is enlarged. There are bilateral pleural effusions identified with overlying bibasilar atelectasis. Pulmonary vascular congestion noted. IMPRESSION: 1. Cardiac enlargement, bilateral pleural effusions and pulmonary vascular congestion compatible with CHF. Electronically Signed   By: Kerby Moors M.D.   On: 11/18/2019 11:28   CT CHEST WO CONTRAST  Result Date: 11/26/2019 CLINICAL DATA:  Status post CABG. EXAM: CT CHEST WITHOUT CONTRAST TECHNIQUE: Multidetector CT imaging of the chest was performed following the standard protocol without IV contrast. COMPARISON:  October 29, 2019 FINDINGS: Cardiovascular: There is moderate severity calcification of the aortic arch. The ascending thoracic aorta measures 4.0 cm x 3.7 cm.  There is mild cardiomegaly. A trace amount of pericardial fluid is seen. Marked severity coronary artery calcification is noted. Mediastinum/Nodes: Mild pretracheal and AP window lymphadenopathy is seen. A mild-to-moderate amount of fluid attenuation is seen within the region anterior to the ascending thoracic aorta and posterior to the sternum. A solitary surgical clip is also seen within this region. Lungs/Pleura: Moderate to marked severity atelectasis and/or infiltrate is seen within the left lower lobe. Mild areas of atelectasis are seen within the posterior aspect of the bilateral upper lobes. There are small bilateral pleural effusions. Upper Abdomen: No acute abnormality. Musculoskeletal: Multiple sternal wires are present. Mild degenerative changes seen within the thoracic spine. IMPRESSION: 1. Moderate to marked severity left lower lobe atelectasis and/or infiltrate. 2. Small bilateral pleural effusions. 3. Mild cardiomegaly. 4. Mild-to-moderate amount of fluid within the region anterior to the ascending thoracic aorta and posterior to the sternum without an organized fluid collection or abscess. Aortic Atherosclerosis (ICD10-I70.0). Electronically Signed   By: Virgina Norfolk M.D.   On: 11/26/2019 20:40   ECHOCARDIOGRAM LIMITED  Result Date: 12/01/2019    ECHOCARDIOGRAM LIMITED REPORT   Patient Name:   DOMINIE BENEDICK Luis Date of Exam: 12/01/2019 Medical Rec #:  [id number]         Height:       67.0 in Accession #:  [id number]        Weight:       175.0 lb Date of Birth:  1961-02-07         BSA:          1.911 m Patient Age:    82 years          BP:           135/78 mmHg Patient Gender: M                 HR:           51 bpm. Exam Location:  Inpatient Procedure: Intracardiac Opacification Agent, Limited Echo and Limited Color            Doppler Indications:    I50.23 Acute on chronic systolic (congestive) heart failure  History:        Patient has prior history of Echocardiogram examinations, most                  recent 10/29/2019. CAD and Previous Myocardial Infarction; Risk                 Factors:Hypertension, Dyslipidemia and Diabetes.  Sonographer:    Jonelle Sidle Dance Referring Phys: [id number] [place]  1. There is no left ventricular throombus.. Left ventricular ejection fraction, by estimation, is 30%. The left ventricle has severely decreased function. The left ventricle demonstrates regional wall motion abnormalities (see scoring diagram/findings for description). There is mild concentric left ventricular hypertrophy. Left ventricular diastolic function could not be evaluated. There is mild dyskinesis of the entire left ventricular apex. There is severe hypokinesis of the left ventricular, mid-apical anteroseptal wall, anterior wall, inferior wall and anterolateral wall.  2. Right ventricular systolic function is mildly reduced. The right ventricular size is normal. Tricuspid regurgitation signal is inadequate for assessing PA pressure.  3. Left atrial size was severely dilated.  4. Right atrial size was mildly dilated.  5. Large pleural effusion in the left lateral region.  6. The mitral valve is normal in structure. No evidence of mitral valve regurgitation.  7. The aortic valve is tricuspid. Aortic valve regurgitation is not visualized. No aortic stenosis is present.  8. The inferior vena cava is normal in size with greater than 50% respiratory variability, suggesting right atrial pressure of 3 mmHg. Comparison(s): Prior images reviewed side by side. The left ventricular function is unchanged. The left ventricular wall motion abnormalities are unchanged. FINDINGS  Left Ventricle: There is no left ventricular throombus. Left ventricular ejection fraction, by estimation, is 30%. The left ventricle has severely decreased function. The left ventricle demonstrates regional wall motion abnormalities. Mild dyskinesis of  the left ventricular, entire apical segment. Severe hypokinesis of the left  ventricular, mid-apical anteroseptal wall, anterior wall, inferior wall and anterolateral wall. Definity contrast agent was given IV to delineate the left ventricular endocardial borders. The left ventricular internal cavity size was normal in size. There is mild concentric left ventricular hypertrophy. Abnormal (paradoxical) septal motion consistent with post-operative status. Right Ventricle: The right ventricular size is normal. No increase in right ventricular wall thickness. Right ventricular systolic function is mildly reduced. Tricuspid regurgitation signal is inadequate for assessing PA pressure. Left Atrium: Left atrial size was severely dilated. Right Atrium: Right atrial size was mildly dilated. Pericardium: There is no evidence of pericardial effusion. Mitral Valve: The mitral valve is normal in structure. Tricuspid Valve: The tricuspid valve is normal in structure. Tricuspid valve regurgitation is mild. Aortic Valve:  The aortic valve is tricuspid. Aortic valve regurgitation is not visualized. No aortic stenosis is present. Pulmonic Valve: The pulmonic valve was normal in structure. Pulmonic valve regurgitation is not visualized. Venous: The inferior vena cava is normal in size with greater than 50% respiratory variability, suggesting right atrial pressure of 3 mmHg. IAS/Shunts: No atrial level shunt detected by color flow Doppler. Additional Comments: There is a large pleural effusion in the left lateral region. LEFT VENTRICLE PLAX 2D LVIDd:         5.30 cm LVIDs:         4.20 cm LV PW:         1.20 cm LV IVS:        1.50 cm LVOT diam:     1.90 cm LVOT Area:     2.84 cm  RIGHT VENTRICLE          IVC RV Basal diam:  2.90 cm  IVC diam: 1.90 cm TAPSE (M-mode): 1.4 cm LEFT ATRIUM             Index       RIGHT ATRIUM           Index LA diam:        4.80 cm 2.51 cm/m  RA Area:     18.30 cm LA Vol (A2C):   86.4 ml 45.22 ml/m RA Volume:   50.80 ml  26.59 ml/m LA Vol (A4C):   75.3 ml 39.41 ml/m LA Biplane  Vol: 81.3 ml 42.55 ml/m   AORTA Ao Root diam: 3.50 cm Ao Asc diam:  3.20 cm  SHUNTS Systemic Diam: 1.90 cm Dani Gobble Croitoru MD Electronically signed by Sanda Klein MD Signature Date/Time: 12/01/2019/3:49:56 PM    Final      Discharge Medications: Allergies as of 12/18/2019      Reactions   Codeine Shortness Of Breath, Itching   Gabapentin Other (See Comments)   Suicidal thoughts, extreme dreams   Pregabalin    Suicidal thoughts,extreme dreams    Simvastatin    Pain in joints   Hydrocodone Nausea Only, Rash   Breathing problems    Lisinopril Itching, Rash   Venlafaxine Rash      Medication List    STOP taking these medications   amiodarone 200 MG tablet Commonly known as: PACERONE   isosorbide dinitrate 5 MG tablet Commonly known as: Isordil Titradose   metoprolol tartrate 25 MG tablet Commonly known as: LOPRESSOR     TAKE these medications   acetaminophen 500 MG tablet Commonly known as: TYLENOL Take 1,000 mg by mouth every 6 (six) hours as needed for headache.   amLODipine 10 MG tablet Commonly known as: NORVASC Take 1 tablet (10 mg total) by mouth daily.   apixaban 5 MG Tabs tablet Commonly known as: ELIQUIS Take 1 tablet (5 mg total) by mouth 2 (two) times daily.   atorvastatin 40 MG tablet Commonly known as: LIPITOR Take 1 tablet (40 mg total) by mouth daily.   Centrum Silver tablet Take 1 tablet by mouth daily.   ciprofloxacin 500 MG tablet Commonly known as: Cipro Take 1 tablet (500 mg total) by mouth 2 (two) times daily for 7 days.   clopidogrel 75 MG tablet Commonly known as: PLAVIX Take 1 tablet (75 mg total) by mouth daily.   eucerin cream Apply topically daily. What changed:   how much to take  when to take this  reasons to take this   furosemide 40 MG tablet Commonly known as: Lasix  Take 1 tablet (40 mg total) by mouth daily.   Jardiance 25 MG Tabs tablet Generic drug: empagliflozin Take 25 mg by mouth daily.   lidocaine 5  % Commonly known as: Lidoderm Place 2 patches onto the skin every 12 (twelve) hours. Remove & Discard patch within 12 hours or as directed by MD What changed: Another medication with the same name was removed. Continue taking this medication, and follow the directions you see here.   losartan 100 MG tablet Commonly known as: COZAAR Take 1 tablet (100 mg total) by mouth daily.   metFORMIN 1000 MG tablet Commonly known as: GLUCOPHAGE Take 1,000 mg by mouth 2 (two) times daily with a meal.   metoprolol succinate 25 MG 24 hr tablet Commonly known as: TOPROL-XL Take 1 tablet (25 mg total) by mouth daily.   oxyCODONE 5 MG immediate release tablet Commonly known as: Oxy IR/ROXICODONE Take 1 tablet (5 mg total) by mouth every 4 (four) hours as needed for severe pain.   potassium chloride 10 MEQ tablet Commonly known as: KLOR-CON Take 2 tablets (20 mEq total) by mouth daily. What changed: how much to take   triamcinolone cream 0.1 % Commonly known as: KENALOG Apply 1 application topically 2 (two) times daily. What changed:   when to take this  reasons to take this   Trulicity 2.35 TI/1.4ER Sopn Generic drug: Dulaglutide Inject 0.75 mg into the skin once a week.       Follow Up Appointments:  Follow-up Information    Wonda Olds, MD. Go on 12/21/2019.   Specialty: Cardiothoracic Surgery Why: Appointment time is at 12:00 pm Contact information: [address] [place] [address] [phone]               Signed: Sharalyn Ink [facility] 12/18/2019, 9:54 AM

## 2019-11-27 NOTE — Transfer of Care (Signed)
Immediate Anesthesia Transfer of Care Note  Patient: [name]  Procedure(s) Performed: STERNAL WOUND EXPLORATION (N/A ) APPLICATION OF WOUND VAC (N/A )  Patient Location: PACU  Anesthesia Type:General  Level of Consciousness: awake, alert , oriented, patient cooperative and responds to stimulation  Airway & Oxygen Therapy: Patient Spontanous Breathing and Patient connected to nasal cannula oxygen  Post-op Assessment: Report given to RN and Post -op Vital signs reviewed and stable  Post vital signs: Reviewed and stable  Last Vitals:  Vitals Value Taken Time  BP 125/66 11/27/19 1244  Temp    Pulse 54 11/27/19 1247  Resp 18 11/27/19 1247  SpO2 86 % 11/27/19 1247  Vitals shown include unvalidated device data.  Last Pain:  Vitals:   11/27/19 0948  TempSrc:   PainSc: 7          Complications: No complications documented.

## 2019-11-27 NOTE — Progress Notes (Signed)
Pre Procedure note for inpatients:   [name] has been scheduled for Procedure(s): STERNAL WOUND EXPLORATION (N/A) APPLICATION OF WOUND VAC (N/A) today. The various methods of treatment have been discussed with the patient. After consideration of the risks, benefits and treatment options the patient has consented to the planned procedure.   The patient has been seen and labs reviewed. There are no changes in the patient's condition to prevent proceeding with the planned procedure today.patient examined, CT chest images reviewed  Recent labs: CBC Latest Ref Rng & Units 11/26/2019 11/06/2019 11/04/2019  WBC 4.0 - 10.5 K/uL 9.4 11.3(H) 8.7  Hemoglobin 13.0 - 17.0 g/dL 14.7 14.0 13.7  Hematocrit 39 - 52 % 46.6 44.0 43.0  Platelets 150 - 400 K/uL 361 439(H) 304      Ivin Poot III, MD 11/27/2019 11:37 AM

## 2019-11-27 NOTE — Anesthesia Postprocedure Evaluation (Signed)
Anesthesia Post Note  Patient: [name]  Procedure(s) Performed: EXCISIONAL DEBRIDEMENT OF SUPERFICIAL STERNAL WOUND EXPLORATION (N/A Chest) APPLICATION OF WOUND VAC (N/A Chest)     Patient location during evaluation: PACU Anesthesia Type: General Level of consciousness: awake and alert Pain management: pain level controlled Vital Signs Assessment: post-procedure vital signs reviewed and stable Respiratory status: spontaneous breathing, nonlabored ventilation, respiratory function stable and patient connected to nasal cannula oxygen Cardiovascular status: blood pressure returned to baseline and stable Postop Assessment: no apparent nausea or vomiting Anesthetic complications: no   No complications documented.  Last Vitals:  Vitals:   11/27/19 1315 11/27/19 1330  BP: (!) 112/57 (!) 112/59  Pulse: (!) 54 (!) 55  Resp: 18 14  Temp:    SpO2: 91% 93%    Last Pain:  Vitals:   11/27/19 1330  TempSrc:   PainSc: 0-No pain                 Caryle Helgeson,W. EDMOND

## 2019-11-27 NOTE — Anesthesia Preprocedure Evaluation (Addendum)
Anesthesia Evaluation  Patient identified by MRN, date of birth, ID band Patient awake    Reviewed: Allergy & Precautions, H&P , NPO status , Patient's Chart, lab work & pertinent test results  Airway Mallampati: II  TM Distance: >3 FB Neck ROM: Full    Dental no notable dental hx. (+) Edentulous Upper, Edentulous Lower, Dental Advisory Given   Pulmonary neg pulmonary ROS, Patient abstained from smoking., former smoker,    Pulmonary exam normal breath sounds clear to auscultation       Cardiovascular hypertension, Pt. on medications + CAD, + Past MI, + CABG, + Peripheral Vascular Disease and +CHF   Rhythm:Regular Rate:Normal     Neuro/Psych Anxiety negative neurological ROS     GI/Hepatic negative GI ROS, Neg liver ROS,   Endo/Other  diabetes, Insulin Dependent, Oral Hypoglycemic Agents  Renal/GU negative Renal ROS  negative genitourinary   Musculoskeletal   Abdominal   Peds  Hematology negative hematology ROS (+)   Anesthesia Other Findings   Reproductive/Obstetrics negative OB ROS                            Anesthesia Physical Anesthesia Plan  ASA: IV  Anesthesia Plan: General   Post-op Pain Management:    Induction: Intravenous  PONV Risk Score and Plan: 3 and Ondansetron, Midazolam and Treatment may vary due to age or medical condition  Airway Management Planned: Oral ETT  Additional Equipment:   Intra-op Plan:   Post-operative Plan: Extubation in OR  Informed Consent: I have reviewed the patients History and Physical, chart, labs and discussed the procedure including the risks, benefits and alternatives for the proposed anesthesia with the patient or authorized representative who has indicated his/her understanding and acceptance.     Dental advisory given  Plan Discussed with: CRNA  Anesthesia Plan Comments:         Anesthesia Quick Evaluation

## 2019-11-27 NOTE — Op Note (Signed)
NAME: [name], [name]. MEDICAL RECORD [id number] ACCOUNT [id number] DATE OF BIRTH:[birth year] FACILITY: [facility] LOCATION: [id number] PHYSICIAN:Randolf Sansoucie VAN TRIGT III, MD  OPERATIVE REPORT  DATE OF PROCEDURE:  11/27/2019  OPERATION: 1.  Excisional debridement of superficial sternal wound. 2.  Irrigation of superficial sternal wound. 3. Placement Wound VAC  SURGEON:  Ivin Poot, MD  ANESTHESIA:  General.  PREOPERATIVE DIAGNOSIS:   History of multivessel coronary artery bypass graft 1 month ago by Dr. Orvan Seen with readmission for nonhealing and drainage from the superficial sternal wound at the lower aspect.  POSTOPERATIVE DIAGNOSIS: History of multivessel coronary artery bypass graft 1 month ago by Dr. Orvan Seen with readmission for nonhealing and drainage from the superficial sternal wound at the lower aspect.  DESCRIPTION OF PROCEDURE:  The patient was assessed in preoperative holding after his preoperative studies including CT scan of the chest images and perioperative echocardiogram images were personally reviewed.  I discussed the procedure of superficial  sternal wound excisional debridement with the patient under general anesthesia.  He understood the benefits and associated risks and agreed to proceed with surgery.  PROCEDURE:  The patient was placed supine on the operating table and general anesthesia was induced.  The patient remained stable.  The chest and abdomen were prepped with Betadine, draped as a sterile field.  A proper time-out was performed.  The lower sternal incision was examined and had some skin separation with drainage of fat necrotic material.  This was cultured.  The wound was opened approximately 5 cm where the skin had pulled apart.  The deep fascia was intact.  There was no gross  purulence.  There was no exposed wire or sternal bone.  A liter of saline irrigation was then used to irrigate the wound.  The wound was then sharply debrided to excise fat necrosis.   The skin edges were maintained and were not compromised.  A wound VAC sponge was cut to the appropriate size and configuration, placed in the wound and covered with a wound VAC sheets and wound VAC suction line.  Suction was applied and the sponge compressed to fill the space.  The patient was reversed from  anesthesia, extubated and returned to the recovery room.  Blood loss was minimal.  VN/NUANCE  D:11/27/2019 T:11/27/2019 JOB:[id number]

## 2019-11-27 NOTE — Brief Op Note (Addendum)
11/27/2019  12:41 PM  PATIENT:  [name]  59 y.o. male  PRE-OPERATIVE DIAGNOSIS:  STERNAL WOUND INFECTION  POST-OPERATIVE DIAGNOSIS:  Superficial Sternal Wound Infection  PROCEDURE:  Procedure(s): STERNAL WOUND EXPLORATION (N/A) APPLICATION OF WOUND VAC (N/A)  SURGEON:  Surgeon(s) and Role:    Ivin Poot, MD - Primary  PHYSICIAN ASSISTANT: ASSISTANTS: RNFA   ANESTHESIA:   general  EBL:  none  BLOOD ADMINISTERED:none  DRAINS: wound VAC  LOCAL MEDICATIONS USED:  NONE  SPECIMEN:  Aspirate for culture  DISPOSITION OF SPECIMEN:  microbiology  COUNTS:  YES  TOURNIQUET:  * No tourniquets in log *  DICTATION: .Dragon Dictation  PLAN OF CARE: return to room  PATIENT DISPOSITION:  PACU - hemodynamically stable.   Delay start of Pharmacological VTE agent (>24hrs) due to surgical blood loss or risk of bleeding: yes    PLAN the patient will remain hospitalized until first VAC change on Monday  8-9. HHN face to face referral completed for [facility] care support

## 2019-11-28 ENCOUNTER — Encounter (HOSPITAL_COMMUNITY): Payer: Self-pay | Admitting: Cardiothoracic Surgery

## 2019-11-28 LAB — GLUCOSE, CAPILLARY
Glucose-Capillary: 199 mg/dL — ABNORMAL HIGH (ref 70–99)
Glucose-Capillary: 233 mg/dL — ABNORMAL HIGH (ref 70–99)
Glucose-Capillary: 235 mg/dL — ABNORMAL HIGH (ref 70–99)
Glucose-Capillary: 280 mg/dL — ABNORMAL HIGH (ref 70–99)
Glucose-Capillary: 339 mg/dL — ABNORMAL HIGH (ref 70–99)

## 2019-11-28 LAB — CBC
HCT: 45 % (ref 39.0–52.0)
Hemoglobin: 14 g/dL (ref 13.0–17.0)
MCH: 27.5 pg (ref 26.0–34.0)
MCHC: 31.1 g/dL (ref 30.0–36.0)
MCV: 88.4 fL (ref 80.0–100.0)
Platelets: 364 10*3/uL (ref 150–400)
RBC: 5.09 MIL/uL (ref 4.22–5.81)
RDW: 15.4 % (ref 11.5–15.5)
WBC: 8.5 10*3/uL (ref 4.0–10.5)
nRBC: 0 % (ref 0.0–0.2)

## 2019-11-28 LAB — BASIC METABOLIC PANEL
Anion gap: 14 (ref 5–15)
BUN: 27 mg/dL — ABNORMAL HIGH (ref 6–20)
CO2: 24 mmol/L (ref 22–32)
Calcium: 9.1 mg/dL (ref 8.9–10.3)
Chloride: 100 mmol/L (ref 98–111)
Creatinine, Ser: 1.21 mg/dL (ref 0.61–1.24)
GFR calc Af Amer: >60 mL/min (ref >60)
GFR calc non Af Amer: >60 mL/min (ref >60)
Glucose, Bld: 300 mg/dL — ABNORMAL HIGH (ref 70–99)
Potassium: 4.9 mmol/L (ref 3.5–5.1)
Sodium: 138 mmol/L (ref 135–145)

## 2019-11-28 MED ORDER — INSULIN DETEMIR 100 UNIT/ML ~~LOC~~ SOLN
20.0000 [IU] | Freq: Two times a day (BID) | SUBCUTANEOUS | Status: DC
  Administered 2019-11-28 – 2019-12-04 (×13): 20 [IU] via SUBCUTANEOUS
  Filled 2019-11-28 (×18): qty 0.2

## 2019-11-28 MED ORDER — SODIUM CHLORIDE 0.9 % IV SOLN
2.0000 g | INTRAVENOUS | Status: DC
  Administered 2019-11-28 – 2019-11-30 (×3): 2 g via INTRAVENOUS
  Filled 2019-11-28 (×2): qty 20
  Filled 2019-11-28: qty 2

## 2019-11-28 MED ORDER — CEFTRIAXONE SODIUM 2 G IJ SOLR: 2.0000 g | INTRAMUSCULAR | Status: DC

## 2019-11-28 NOTE — Progress Notes (Signed)
1 Day Post-Op Procedure(s) (LRB): EXCISIONAL DEBRIDEMENT OF SUPERFICIAL STERNAL WOUND EXPLORATION (N/A) APPLICATION OF WOUND VAC (N/A) Subjective: Some incisional discomfort  Objective: Vital signs in last 24 hours: Temp:  [97.6 F (36.4 C)-98.4 F (36.9 C)] 97.6 F (36.4 C) (08/07 1114) Pulse Rate:  [52-78] 56 (08/07 1114) Cardiac Rhythm: Normal sinus rhythm (08/07 0700) Resp:  [11-19] 18 (08/07 1114) BP: (112-179)/(57-99) 144/76 (08/07 1114) SpO2:  [90 %-98 %] 95 % (08/07 1114)  Hemodynamic parameters for last 24 hours:    Intake/Output from previous day: 08/06 0701 - 08/07 0700 In: 1094.8 [P.O.:240; I.V.:455.3; IV Piggyback:399.4] Out: 1975 [BPZWC:5852; Drains:50] Intake/Output this shift: Total I/O In: 240 [P.O.:240] Out: 700 [Urine:700]  General appearance: alert, cooperative and no distress Neurologic: intact Heart: regular rate and rhythm Lungs: clear to auscultation bilaterally Wound: VAC in place  Lab Results: Recent Labs    11/26/19 1614 11/28/19 0312  WBC 9.4 8.5  HGB 14.7 14.0  HCT 46.6 45.0  PLT 361 364   BMET:  Recent Labs    11/26/19 1614 11/28/19 0312  NA 138 138  K 4.3 4.9  CL 98 100  CO2 28 24  GLUCOSE 310* 300*  BUN 21* 27*  CREATININE 1.13 1.21  CALCIUM 9.0 9.1    PT/INR:  Recent Labs    11/26/19 1614  LABPROT 15.9*  INR 1.3*   ABG    Component Value Date/Time   PHART 7.271 (L) 10/30/2019 0506   HCO3 22.2 10/30/2019 0506   TCO2 24 10/30/2019 0506   ACIDBASEDEF 5.0 (H) 10/30/2019 0506   O2SAT 58.3 11/03/2019 0314   CBG (last 3)  Recent Labs    11/27/19 2357 11/28/19 0619 11/28/19 1116  GLUCAP 339* 235* 280*    Assessment/Plan: S/P Procedure(s) (LRB): EXCISIONAL DEBRIDEMENT OF SUPERFICIAL STERNAL WOUND EXPLORATION (N/A) APPLICATION OF WOUND VAC (N/A) -Superficial wound infection- s/p excisional debridement and VAC placement On Vancomycin Cultures pending Hyperglycemia- poorly controlled- will add levemir    LOS: 2 days    [name] 11/28/2019

## 2019-11-28 NOTE — Progress Notes (Signed)
RT note. Pt. Refused CPAP tonight . Did not like it last night. Will wear 3LNC . RT will continue to monitor.

## 2019-11-29 LAB — COMPREHENSIVE METABOLIC PANEL
ALT: 22 U/L (ref 0–44)
AST: 22 U/L (ref 15–41)
Albumin: 2.6 g/dL — ABNORMAL LOW (ref 3.5–5.0)
Alkaline Phosphatase: 121 U/L (ref 38–126)
Anion gap: 12 (ref 5–15)
BUN: 38 mg/dL — ABNORMAL HIGH (ref 6–20)
CO2: 23 mmol/L (ref 22–32)
Calcium: 8.9 mg/dL (ref 8.9–10.3)
Chloride: 102 mmol/L (ref 98–111)
Creatinine, Ser: 1.32 mg/dL — ABNORMAL HIGH (ref 0.61–1.24)
GFR calc Af Amer: >60 mL/min (ref >60)
GFR calc non Af Amer: 59 mL/min — ABNORMAL LOW (ref >60)
Glucose, Bld: 235 mg/dL — ABNORMAL HIGH (ref 70–99)
Potassium: 4.7 mmol/L (ref 3.5–5.1)
Sodium: 137 mmol/L (ref 135–145)
Total Bilirubin: 0.5 mg/dL (ref 0.3–1.2)
Total Protein: 6.2 g/dL — ABNORMAL LOW (ref 6.5–8.1)

## 2019-11-29 LAB — GLUCOSE, CAPILLARY
Glucose-Capillary: 157 mg/dL — ABNORMAL HIGH (ref 70–99)
Glucose-Capillary: 72 mg/dL (ref 70–99)
Glucose-Capillary: 75 mg/dL (ref 70–99)
Glucose-Capillary: 118 mg/dL — ABNORMAL HIGH (ref 70–99)
Glucose-Capillary: 109 mg/dL — ABNORMAL HIGH (ref 70–99)

## 2019-11-29 LAB — CBC
HCT: 42.8 % (ref 39.0–52.0)
Hemoglobin: 13.4 g/dL (ref 13.0–17.0)
MCH: 28.2 pg (ref 26.0–34.0)
MCHC: 31.3 g/dL (ref 30.0–36.0)
MCV: 90.1 fL (ref 80.0–100.0)
Platelets: 347 10*3/uL (ref 150–400)
RBC: 4.75 MIL/uL (ref 4.22–5.81)
RDW: 15.7 % — ABNORMAL HIGH (ref 11.5–15.5)
WBC: 13.1 10*3/uL — ABNORMAL HIGH (ref 4.0–10.5)
nRBC: 0 % (ref 0.0–0.2)

## 2019-11-29 NOTE — Progress Notes (Addendum)
2 Days Post-Op Procedure(s) (LRB): EXCISIONAL DEBRIDEMENT OF SUPERFICIAL STERNAL WOUND EXPLORATION (N/A) APPLICATION OF WOUND VAC (N/A) Subjective: No complaint this AM  Objective: Vital signs in last 24 hours: Temp:  [97.6 F (36.4 C)-98.7 F (37.1 C)] 98.7 F (37.1 C) (08/08 0818) Pulse Rate:  [51-73] 62 (08/08 0933) Cardiac Rhythm: Heart block (08/08 0838) Resp:  [13-21] 17 (08/08 0818) BP: (122-171)/(58-85) 171/85 (08/08 0818) SpO2:  [90 %-100 %] 93 % (08/08 0933) Weight:  [80.1 kg] 80.1 kg (08/08 0640)  Hemodynamic parameters for last 24 hours:    Intake/Output from previous day: 08/07 0701 - 08/08 0700 In: 1864 [P.O.:1490; I.V.:124; IV Piggyback:250] Out: 2150 [Urine:2150] Intake/Output this shift: Total I/O In: 120 [P.O.:120] Out: -   General appearance: alert, cooperative and no distress Neurologic: intact Heart: regular rate and rhythm Wound: VAC in place, skin edges without erythema  Lab Results: Recent Labs    11/28/19 0312 11/29/19 0334  WBC 8.5 13.1*  HGB 14.0 13.4  HCT 45.0 42.8  PLT 364 347   BMET:  Recent Labs    11/28/19 0312 11/29/19 0334  NA 138 137  K 4.9 4.7  CL 100 102  CO2 24 23  GLUCOSE 300* 235*  BUN 27* 38*  CREATININE 1.21 1.32*  CALCIUM 9.1 8.9    PT/INR:  Recent Labs    11/26/19 1614  LABPROT 15.9*  INR 1.3*   ABG    Component Value Date/Time   PHART 7.271 (L) 10/30/2019 0506   HCO3 22.2 10/30/2019 0506   TCO2 24 10/30/2019 0506   ACIDBASEDEF 5.0 (H) 10/30/2019 0506   O2SAT 58.3 11/03/2019 0314   CBG (last 3)  Recent Labs    11/29/19 0625 11/29/19 1121 11/29/19 1123  GLUCAP 157* 72 75    Assessment/Plan: S/P Procedure(s) (LRB): EXCISIONAL DEBRIDEMENT OF SUPERFICIAL STERNAL WOUND EXPLORATION (N/A) APPLICATION OF WOUND VAC (N/A) -Continue VAC- probably dressing change tomorrow Cultures growing Klebsiella oxytoca- on Ceftriaxone On ASA, plavix and has SCDs   LOS: 3 days    [name] 11/29/2019

## 2019-11-29 NOTE — Progress Notes (Signed)
Patient refused CPAP for tonight. RT instructed patient to have RT called if he changes his mind. RT will monitor as needed. 

## 2019-11-29 NOTE — Plan of Care (Signed)
Poc progressing.  

## 2019-11-30 LAB — GLUCOSE, CAPILLARY
Glucose-Capillary: 103 mg/dL — ABNORMAL HIGH (ref 70–99)
Glucose-Capillary: 157 mg/dL — ABNORMAL HIGH (ref 70–99)
Glucose-Capillary: 108 mg/dL — ABNORMAL HIGH (ref 70–99)

## 2019-11-30 MED ORDER — POTASSIUM CHLORIDE CRYS ER 10 MEQ PO TBCR
20.0000 meq | EXTENDED_RELEASE_TABLET | Freq: Every day | ORAL | 1 refills | Status: DC
Start: 2019-11-30 — End: 2020-06-06

## 2019-11-30 MED ORDER — TRAMADOL HCL 50 MG PO TABS: 50.0000 mg | ORAL_TABLET | Freq: Four times a day (QID) | ORAL | 0 refills | Status: DC | PRN

## 2019-11-30 MED ORDER — SODIUM CHLORIDE 0.9 % IV SOLN
2.0000 g | Freq: Three times a day (TID) | INTRAVENOUS | Status: DC
  Administered 2019-11-30 – 2019-12-08 (×22): 2 g via INTRAVENOUS
  Filled 2019-11-30 (×27): qty 2

## 2019-11-30 NOTE — Progress Notes (Signed)
Pt ambulated 800 ft without any assistive device. Got slight short of breath, o2 sat dropped to 84 in RA. Pt wears 2-3 L o2 via [place] @night , that's is baseline at home. 0s sat back to 96% I 3l o2 as he was back in room. Will continue to monitor.

## 2019-11-30 NOTE — Progress Notes (Addendum)
Per Dr. Orvan Seen, will hold discharge as he wants to look at wound in am   Wound vac changed at bedside this afternoon; patient tolerated the dressing change, but thorough inspection of the wound was not feasible. I would prefer to do more thorough I & D of the wound in the OR prior to his discharge. This will be arranged for 1st thing in the morning. He understands and wishes to proceed. Andrea Colglazier Z. Orvan Seen, [place]

## 2019-11-30 NOTE — Plan of Care (Signed)
POC Progressing: Continue to monitor patients.

## 2019-11-30 NOTE — Progress Notes (Addendum)
      [address]       [place] [address]             [phone]        3 Days Post-Op Procedure(s) (LRB): EXCISIONAL DEBRIDEMENT OF SUPERFICIAL STERNAL WOUND EXPLORATION (N/A) APPLICATION OF WOUND VAC (N/A)  Subjective: Patient without complaints this am. He hopes to go home.  Objective: Vital signs in last 24 hours: Temp:  [97.5 F (36.4 C)-98.7 F (37.1 C)] 98.2 F (36.8 C) (08/09 0721) Pulse Rate:  [52-74] 74 (08/09 0721) Cardiac Rhythm: Heart block (08/08 2030) Resp:  [15-22] 19 (08/09 0721) BP: (114-171)/(60-90) 157/80 (08/09 0721) SpO2:  [90 %-97 %] 97 % (08/09 0721) Weight:  [80.4 kg] 80.4 kg (08/09 0435)   Current Weight  11/30/19 80.4 kg        Intake/Output from previous day: 08/08 0701 - 08/09 0700 In: 600 [P.O.:600] Out: 1520 [Urine:1450; Drains:70]   Physical Exam:  Cardiovascular: RRR Pulmonary: Clear to auscultation bilaterall Abdomen: Soft, non tender, bowel sounds present. Extremities: No lower extremity edema. Wounds: Eschars on chest tube wounds. VAC on sternal wound with blood drainage around it  Lab Results: CBC: Recent Labs    11/28/19 0312 11/29/19 0334  WBC 8.5 13.1*  HGB 14.0 13.4  HCT 45.0 42.8  PLT 364 347   BMET:  Recent Labs    11/28/19 0312 11/29/19 0334  NA 138 137  K 4.9 4.7  CL 100 102  CO2 24 23  GLUCOSE 300* 235*  BUN 27* 38*  CREATININE 1.21 1.32*  CALCIUM 9.1 8.9    PT/INR:  Lab Results  Component Value Date   INR 1.3 (H) 11/26/2019   INR 1.5 (H) 10/29/2019   INR 1.2 10/29/2019   ABG:  INR: Will add last result for INR, ABG once components are confirmed Will add last 4 CBG results once components are confirmed  Assessment/Plan:  1. CV - Had bradycardia with HR briefly into the high 40's  over the weekend. On Lopressor 12.5 mg bid, Amiodarone 200 mg daily, Isordil 10 mg tid, Plavix 75 mg daily, ec asa 81 mg daily, Apixaban 5 mg bid. Will stop Amiodarone 2.  Pulmonary -  On3 liters of oxygen via [place] at night (was on prior) 3. ID-Klebsiella Oxytoca. On Ceftriaxone for sternal wound. Per Dr. Orvan Seen, will change to oral Cipro at discharge. Likely change wound VAC this am 4. Volume overload-on Lasix 40 mg daily 5. DM-CBGs 118/109/103. On Metformin 1000 mg bid. Will restart Jardiance and Trulicity at discharge 6. Will discuss disposition plans with Dr. Pauletta Browns M ZimmermanPA-C 11/30/2019,7:42 AM

## 2019-11-30 NOTE — Progress Notes (Signed)
SATURATION QUALIFICATIONS: (This note is used to comply with regulatory documentation for home oxygen)  Patient Saturations on Room Air at Rest = 95%  Patient Saturations on Room Air while Ambulating = 90%   

## 2019-11-30 NOTE — TOC Progression Note (Signed)
Transition of Care [facility]) - Progression Note    Patient Details  Name: [name] MRN: [id number] Date of Birth: 01-30-1961  Transition of Care [facility]) CM/SW [place], RN Phone Number: 11/30/2019, 2:20 PM  Clinical Narrative:    Called multiple agencies [facility], [facility], [facility], [facility], [facility], [facility], [place], [place], All have staffing issues or do not have contract with new prepaid  Medicaid.  Discussed with Dr. Orvan Seen. Wound vac measurements still need to be placed. In chart as well as wound vac still needs to get here. The patient may need to get dressing changes done in the office.messaged  Dr. Orvan Seen,  may keep patient another day    Expected Discharge Plan: [place]    Expected Discharge Plan and Services Expected Discharge Plan: [place]   Discharge Planning Services: CM Consult Post Acute Care Choice: Home Health   Expected Discharge Date: 11/30/19                                     Social Determinants of Health (SDOH) Interventions    Readmission Risk Interventions No flowsheet data found.

## 2019-11-30 NOTE — TOC Initial Note (Signed)
Transition of Care [facility]) - Initial/Assessment Note    Patient Details  Name: [name] MRN: [id number] Date of Birth: 10-Nov-1960  Transition of Care [facility]) CM/SW Contact:    Verdell Carmine, RN Phone Number: 11/30/2019, 10:33 AM  Clinical Narrative:                 Admitted for sternal infection post CABG. Will need wound vac at home. All paperwork faxed to [facility]. On shadow chart. Will need HH RN set up Spoke to Dr. Orvan Seen, will not need IV ABX at home, will switch to oral.  Hope to get wound vac here today.   Expected Discharge Plan: [place]     Patient Goals and CMS Choice Patient states their goals for this hospitalization and ongoing recovery are:: HOME   Choice offered to / list presented to : Patient  Expected Discharge Plan and Services Expected Discharge Plan: [place]   Discharge Planning Services: CM Consult Post Acute Care Choice: Home Health                                        Prior Living Arrangements/Services   Lives with:: Self Patient language and need for interpreter reviewed:: Yes Do you feel safe going back to the place where you live?: Yes      Need for Family Participation in Patient Care: Yes (Comment) Care giver support system in place?: Yes (comment)   Criminal Activity/Legal Involvement Pertinent to Current Situation/Hospitalization: No - Comment as needed  Activities of Daily Living      Permission Sought/Granted      Share Information with NAME: mother sheila           Emotional Assessment Appearance:: Appears older than stated age     Orientation: : Oriented to Self, Oriented to  Time, Oriented to Place, Oriented to Situation   Psych Involvement: No (comment)  Admission diagnosis:  Wound of sternal region [S21.109A] Sternal wound dehiscence, initial encounter [T81.32XA] Patient Active Problem List   Diagnosis Date Noted  . Sternal wound dehiscence 11/26/2019  .  Sternal wound infection 11/26/2019  . Wound of sternal region 11/26/2019  . Hx of CABG 11/23/2019  . Ischemic cardiomyopathy 11/23/2019  . PAF (paroxysmal atrial fibrillation) ([place]) 11/23/2019  . PVD (peripheral vascular disease) ([place]) 11/23/2019  . Chronic back pain 11/23/2019  . Prolonged Q-T interval on ECG 11/23/2019  . Stenosis of right carotid artery   . Pleural effusion 10/26/2019  . Acute combined systolic and diastolic heart failure ([place]) 10/25/2019  . Acute congestive heart failure ([place])   . Superficial bacterial skin infection 06/16/2018  . Bacterial skin infection 06/16/2018  . CAD S/P percutaneous coronary angioplasty 08/29/2011  . Tobacco abuse 08/29/2011  . Hypertension 08/29/2011  . Hyperlipidemia 08/29/2011  . Diabetes mellitus ([place]) 08/29/2011   PCP:  Concepcion Elk, MD Pharmacy:   [facility], [place] [address] Phone: [phone] Fax: [phone]     Social Determinants of Health (SDOH) Interventions    Readmission Risk Interventions No flowsheet data found.

## 2019-11-30 NOTE — Progress Notes (Signed)
Night time metoprolol was held as pt's HR was 54. Sunday am HR dropped to 49 after night time dose of metoprolol.  Will continue to monitor.

## 2019-11-30 NOTE — Progress Notes (Signed)
Pharmacy Antibiotic Note  [name] is a 59 y.o. male admitted on 11/26/2019 with sternal wound infection.  Pt was previously on Vancomycin and Rocephin.  Pharmacy has been consulted to change antibiotics to Cefepime.  Plan: Cefepime 2gm IV q8h  Height: 5\' 7"  (170.2 cm) Weight: 80.4 kg (177 lb 4 oz) IBW/kg (Calculated) : 66.1  Temp (24hrs), Avg:97.9 F (36.6 C), Min:97.6 F (36.4 C), Max:98.2 F (36.8 C)  Recent Labs  Lab 11/26/19 1614 11/28/19 0312 11/29/19 0334  WBC 9.4 8.5 13.1*  CREATININE 1.13 1.21 1.32*    Estimated Creatinine Clearance: 62.7 mL/min (A) (by C-G formula based on SCr of 1.32 mg/dL (H)).    Allergies  Allergen Reactions  . Codeine Shortness Of Breath and Itching  . Gabapentin Other (See Comments)    Suicidal thoughts, extreme dreams  . Pregabalin     Suicidal thoughts,extreme dreams   . Simvastatin     Pain in joints  . Hydrocodone Nausea Only and Rash    Breathing problems   . Lisinopril Itching and Rash  . Venlafaxine Rash    Antimicrobials this admission: Cefepime 8/9 >> Ceftriaxone 8/7 >>8/9 Vanco 8/5>>8/7  Dose adjustments this admission:   Microbiology results: 8/6 wound cx rare GNR - Klebsiella Oxytoca  Thank you for allowing pharmacy to be a part of this patient's care.  [employer], Pharm.D., BCPS Clinical Pharmacist  **Pharmacist phone directory can be found on [email or web address] listed under [place].  11/30/2019 9:34 PM

## 2019-12-01 ENCOUNTER — Encounter (HOSPITAL_COMMUNITY): Payer: Self-pay | Admitting: Cardiothoracic Surgery

## 2019-12-01 ENCOUNTER — Inpatient Hospital Stay (HOSPITAL_COMMUNITY): Payer: Medicaid Other

## 2019-12-01 ENCOUNTER — Encounter (HOSPITAL_COMMUNITY)
Admission: AD | Disposition: A | Discharge status: Home Health | Payer: Self-pay | Source: Ambulatory Visit | Attending: Cardiothoracic Surgery

## 2019-12-01 ENCOUNTER — Inpatient Hospital Stay (HOSPITAL_COMMUNITY): Payer: Medicaid Other | Admitting: Certified Registered"

## 2019-12-01 DIAGNOSIS — I5023 Acute on chronic systolic (congestive) heart failure: Secondary | ICD-10-CM

## 2019-12-01 HISTORY — PX: STERNAL WOUND DEBRIDEMENT: SHX1058

## 2019-12-01 LAB — CBC
HCT: 44.4 % (ref 39.0–52.0)
Hemoglobin: 13.8 g/dL (ref 13.0–17.0)
MCH: 27.6 pg (ref 26.0–34.0)
MCHC: 31.1 g/dL (ref 30.0–36.0)
MCV: 88.8 fL (ref 80.0–100.0)
Platelets: 333 10*3/uL (ref 150–400)
RBC: 5 MIL/uL (ref 4.22–5.81)
RDW: 15.8 % — ABNORMAL HIGH (ref 11.5–15.5)
WBC: 10.7 10*3/uL — ABNORMAL HIGH (ref 4.0–10.5)
nRBC: 0 % (ref 0.0–0.2)

## 2019-12-01 LAB — TYPE AND SCREEN
ABO/RH(D): A POS
Antibody Screen: NEGATIVE

## 2019-12-01 LAB — BASIC METABOLIC PANEL
Anion gap: 13 (ref 5–15)
BUN: 26 mg/dL — ABNORMAL HIGH (ref 6–20)
CO2: 23 mmol/L (ref 22–32)
Calcium: 8.9 mg/dL (ref 8.9–10.3)
Chloride: 100 mmol/L (ref 98–111)
Creatinine, Ser: 1.13 mg/dL (ref 0.61–1.24)
GFR calc Af Amer: >60 mL/min (ref >60)
GFR calc non Af Amer: >60 mL/min (ref >60)
Glucose, Bld: 155 mg/dL — ABNORMAL HIGH (ref 70–99)
Potassium: 4.8 mmol/L (ref 3.5–5.1)
Sodium: 136 mmol/L (ref 135–145)

## 2019-12-01 LAB — ECHOCARDIOGRAM LIMITED
Height: 67 in
S' Lateral: 4.2 cm
Weight: 2800 oz

## 2019-12-01 LAB — GLUCOSE, CAPILLARY
Glucose-Capillary: 76 mg/dL (ref 70–99)
Glucose-Capillary: 100 mg/dL — ABNORMAL HIGH (ref 70–99)
Glucose-Capillary: 101 mg/dL — ABNORMAL HIGH (ref 70–99)
Glucose-Capillary: 107 mg/dL — ABNORMAL HIGH (ref 70–99)
Glucose-Capillary: 105 mg/dL — ABNORMAL HIGH (ref 70–99)
Glucose-Capillary: 87 mg/dL (ref 70–99)
Glucose-Capillary: 177 mg/dL — ABNORMAL HIGH (ref 70–99)

## 2019-12-01 SURGERY — DEBRIDEMENT, WOUND, STERNUM
Anesthesia: Monitor Anesthesia Care | Site: Chest

## 2019-12-01 MED ORDER — PHENYLEPHRINE 40 MCG/ML (10ML) SYRINGE FOR IV PUSH (FOR BLOOD PRESSURE SUPPORT)
PREFILLED_SYRINGE | INTRAVENOUS | Status: DC | PRN
  Administered 2019-12-01: 40 ug via INTRAVENOUS
  Administered 2019-12-01: 80 ug via INTRAVENOUS

## 2019-12-01 MED ORDER — SODIUM CHLORIDE 0.9 % IR SOLN
Status: DC | PRN
  Administered 2019-12-01: 1000 mL

## 2019-12-01 MED ORDER — OXYCODONE HCL 5 MG PO TABS: 5.0000 mg | ORAL_TABLET | ORAL | Status: DC | PRN

## 2019-12-01 MED ORDER — MIDAZOLAM HCL 2 MG/2ML IJ SOLN
INTRAMUSCULAR | Status: AC
  Filled 2019-12-01: qty 2

## 2019-12-01 MED ORDER — SODIUM CHLORIDE 0.9 % IR SOLN
Status: DC | PRN
Start: 2019-12-01 — End: 2019-12-01
  Administered 2019-12-01: 3000 mL

## 2019-12-01 MED ORDER — PHENYLEPHRINE 40 MCG/ML (10ML) SYRINGE FOR IV PUSH (FOR BLOOD PRESSURE SUPPORT)
PREFILLED_SYRINGE | INTRAVENOUS | Status: AC
  Filled 2019-12-01: qty 10

## 2019-12-01 MED ORDER — MIDAZOLAM HCL 5 MG/5ML IJ SOLN
INTRAMUSCULAR | Status: DC | PRN
  Administered 2019-12-01 (×2): 1 mg via INTRAVENOUS

## 2019-12-01 MED ORDER — HYDROMORPHONE HCL 2 MG PO TABS
1.0000 mg | ORAL_TABLET | Freq: Four times a day (QID) | ORAL | Status: DC | PRN
  Administered 2019-12-01: 1 mg via ORAL
  Filled 2019-12-01: qty 1

## 2019-12-01 MED ORDER — ONDANSETRON HCL 4 MG/2ML IJ SOLN
INTRAMUSCULAR | Status: DC | PRN
  Administered 2019-12-01: 4 mg via INTRAVENOUS

## 2019-12-01 MED ORDER — DIPHENHYDRAMINE HCL 25 MG PO CAPS: 25.0000 mg | ORAL_CAPSULE | Freq: Four times a day (QID) | ORAL | Status: DC | PRN

## 2019-12-01 MED ORDER — PROPOFOL 500 MG/50ML IV EMUL
INTRAVENOUS | Status: DC | PRN
Start: 2019-12-01 — End: 2019-12-01
  Administered 2019-12-01: 150 ug/kg/min via INTRAVENOUS

## 2019-12-01 MED ORDER — PROPOFOL 1000 MG/100ML IV EMUL
INTRAVENOUS | Status: AC
  Filled 2019-12-01: qty 100

## 2019-12-01 MED ORDER — PERFLUTREN LIPID MICROSPHERE
1.0000 mL | INTRAVENOUS | Status: AC | PRN
  Administered 2019-12-01: 2 mL via INTRAVENOUS
  Filled 2019-12-01: qty 10

## 2019-12-01 MED ORDER — FENTANYL CITRATE (PF) 250 MCG/5ML IJ SOLN
INTRAMUSCULAR | Status: AC
  Filled 2019-12-01: qty 5

## 2019-12-01 MED ORDER — ONDANSETRON HCL 4 MG/2ML IJ SOLN
INTRAMUSCULAR | Status: AC
  Filled 2019-12-01: qty 2

## 2019-12-01 MED ORDER — PROPOFOL 10 MG/ML IV BOLUS
INTRAVENOUS | Status: AC
  Filled 2019-12-01: qty 20

## 2019-12-01 SURGICAL SUPPLY — 63 items
APL SKNCLS STERI-STRIP NONHPOA (GAUZE/BANDAGES/DRESSINGS) ×1
BAG DECANTER FOR FLEXI CONT (MISCELLANEOUS) ×1 IMPLANT
BANDAGE ESMARK 6X9 LF (GAUZE/BANDAGES/DRESSINGS) IMPLANT
BENZOIN TINCTURE PRP APPL 2/3 (GAUZE/BANDAGES/DRESSINGS) ×1 IMPLANT
BLADE CLIPPER SURG (BLADE) ×1 IMPLANT
BLADE SURG 10 STRL SS (BLADE) ×1 IMPLANT
BNDG CMPR 9X6 STRL LF SNTH (GAUZE/BANDAGES/DRESSINGS)
BNDG ESMARK 6X9 LF (GAUZE/BANDAGES/DRESSINGS)
BNDG GAUZE ELAST 4 BULKY (GAUZE/BANDAGES/DRESSINGS) IMPLANT
CANISTER SUCT 3000ML PPV (MISCELLANEOUS) ×2 IMPLANT
CANISTER WOUND CARE 500ML ATS (WOUND CARE) ×2 IMPLANT
CATH FOLEY 2WAY SLVR  5CC 16FR (CATHETERS)
CATH FOLEY 2WAY SLVR 5CC 16FR (CATHETERS) IMPLANT
CLIP VESOCCLUDE SM WIDE 24/CT (CLIP) IMPLANT
CNTNR URN SCR LID CUP LEK RST (MISCELLANEOUS) IMPLANT
CONT SPEC 4OZ STRL OR WHT (MISCELLANEOUS)
COVER SURGICAL LIGHT HANDLE (MISCELLANEOUS) ×3 IMPLANT
DRAPE CHEST BREAST 15X10 FENES (DRAPES) ×1 IMPLANT
DRAPE INCISE IOBAN 66X45 STRL (DRAPES) IMPLANT
DRAPE LAPAROSCOPIC ABDOMINAL (DRAPES) ×1 IMPLANT
DRAPE WARM FLUID 44X44 (DRAPES) IMPLANT
DRSG AQUACEL AG ADV 3.5X14 (GAUZE/BANDAGES/DRESSINGS) ×1 IMPLANT
DRSG VAC ATS LRG SENSATRAC (GAUZE/BANDAGES/DRESSINGS) IMPLANT
DRSG VAC ATS MED SENSATRAC (GAUZE/BANDAGES/DRESSINGS) ×1 IMPLANT
DRSG VAC ATS SM SENSATRAC (GAUZE/BANDAGES/DRESSINGS) IMPLANT
ELECT REM PT RETURN 9FT ADLT (ELECTROSURGICAL) ×2
ELECTRODE REM PT RTRN 9FT ADLT (ELECTROSURGICAL) ×1 IMPLANT
GAUZE SPONGE 4X4 12PLY STRL (GAUZE/BANDAGES/DRESSINGS) ×2 IMPLANT
GAUZE XEROFORM 5X9 LF (GAUZE/BANDAGES/DRESSINGS) IMPLANT
GLOVE BIOGEL PI IND STRL 6 (GLOVE) IMPLANT
GLOVE BIOGEL PI INDICATOR 6 (GLOVE) ×2
GLOVE NEODERM STRL 7.5  LF PF (GLOVE) ×1
GLOVE NEODERM STRL 7.5 LF PF (GLOVE) ×1 IMPLANT
GLOVE SURG NEODERM 7.5  LF PF (GLOVE) ×1
GOWN STRL REUS W/ TWL LRG LVL3 (GOWN DISPOSABLE) ×3 IMPLANT
GOWN STRL REUS W/TWL LRG LVL3 (GOWN DISPOSABLE) ×6
HANDPIECE INTERPULSE COAX TIP (DISPOSABLE) ×2
HEMOSTAT POWDER SURGIFOAM 1G (HEMOSTASIS) IMPLANT
HEMOSTAT SURGICEL 2X14 (HEMOSTASIS) IMPLANT
IV NS IRRIG 3000ML ARTHROMATIC (IV SOLUTION) ×1 IMPLANT
KIT BASIN OR (CUSTOM PROCEDURE TRAY) ×2 IMPLANT
KIT TURNOVER KIT B (KITS) ×2 IMPLANT
MATRIX WOUND 3-LAYER 5X5 (Tissue) ×1 IMPLANT
MATRIX WOUND 3-LAYER 7X10 (Tissue) ×1 IMPLANT
MICROMATRIX 500MG (Tissue) ×2 IMPLANT
NS IRRIG 1000ML POUR BTL (IV SOLUTION) ×2 IMPLANT
PACK GENERAL/GYN (CUSTOM PROCEDURE TRAY) ×2 IMPLANT
PAD ARMBOARD 7.5X6 YLW CONV (MISCELLANEOUS) ×4 IMPLANT
SET HNDPC FAN SPRY TIP SCT (DISPOSABLE) IMPLANT
SOL PREP POV-IOD 4OZ 10% (MISCELLANEOUS) IMPLANT
SOLUTION PARTIC MCRMTRX 500MG (Tissue) IMPLANT
SPONGE LAP 18X18 RF (DISPOSABLE) ×1 IMPLANT
STAPLER VISISTAT 35W (STAPLE) IMPLANT
SUT ETHILON 3 0 FSL (SUTURE) IMPLANT
SUT PDS AB 1 CTX 36 (SUTURE) IMPLANT
SUT PROLENE 2 0 MH 48 (SUTURE) IMPLANT
SUT VIC AB 2-0 CTX 27 (SUTURE) ×2 IMPLANT
SWAB COLLECTION DEVICE MRSA (MISCELLANEOUS) IMPLANT
SWAB CULTURE ESWAB REG 1ML (MISCELLANEOUS) IMPLANT
SYR 5ML LL (SYRINGE) IMPLANT
TOWEL GREEN STERILE (TOWEL DISPOSABLE) ×2 IMPLANT
TOWEL GREEN STERILE FF (TOWEL DISPOSABLE) ×2 IMPLANT
WATER STERILE IRR 1000ML POUR (IV SOLUTION) ×2 IMPLANT

## 2019-12-01 NOTE — Anesthesia Preprocedure Evaluation (Addendum)
Anesthesia Evaluation  Patient identified by MRN, date of birth, ID band Patient awake    Reviewed: Allergy & Precautions, NPO status , Patient's Chart, lab work & pertinent test results  Airway Mallampati: II  TM Distance: >3 FB Neck ROM: Full    Dental  (+) Edentulous Upper, Edentulous Lower, Dental Advisory Given   Pulmonary Patient abstained from smoking., former smoker,     + decreased breath sounds      Cardiovascular hypertension, + CAD, + Past MI, + Cardiac Stents, + CABG, + Peripheral Vascular Disease and +CHF   Rhythm:Regular Rate:Normal     Neuro/Psych Anxiety negative neurological ROS     GI/Hepatic negative GI ROS, Neg liver ROS,   Endo/Other  diabetes  Renal/GU negative Renal ROS     Musculoskeletal negative musculoskeletal ROS (+)   Abdominal Normal abdominal exam  (+)   Peds  Hematology negative hematology ROS (+)   Anesthesia Other Findings   Reproductive/Obstetrics                           Anesthesia Physical Anesthesia Plan  ASA: IV  Anesthesia Plan: MAC   Post-op Pain Management:    Induction: Intravenous  PONV Risk Score and Plan: Propofol infusion and Ondansetron  Airway Management Planned: Natural Airway and Simple Face Mask  Additional Equipment: None  Intra-op Plan:   Post-operative Plan:   Informed Consent: I have reviewed the patients History and Physical, chart, labs and discussed the procedure including the risks, benefits and alternatives for the proposed anesthesia with the patient or authorized representative who has indicated his/her understanding and acceptance.       Plan Discussed with: CRNA  Anesthesia Plan Comments:        Anesthesia Quick Evaluation

## 2019-12-01 NOTE — Progress Notes (Signed)
@  approx. 0430 pt called out endorsing his Wound Vac was alarming. Wound Vac alarm indicated occlusion. Entire system assessed and source of occlusion not apparent. Dr. Orvan Seen paged @approx . [phone] regarding situation and page promptly returned. Verbal order received to clamp Wound Vac but leave dressing in place due to pt going to OR this AM. Pt updated and Wound Vac clamped per order. Will continue to monitor.

## 2019-12-01 NOTE — Transfer of Care (Signed)
Immediate Anesthesia Transfer of Care Note  Patient: [name]  Procedure(s) Performed: STERNAL WOUND DEBRIDEMENT and WOUND VAC CHANGE (N/A Chest)  Patient Location: PACU  Anesthesia Type:MAC  Level of Consciousness: sedated  Airway & Oxygen Therapy: Patient connected to nasal cannula oxygen  Post-op Assessment: Post -op Vital signs reviewed and stable  Post vital signs: stable  Last Vitals:  Vitals Value Taken Time  BP 108/57 12/01/19 0844  Temp    Pulse 49 12/01/19 0844  Resp 19 12/01/19 0844  SpO2 94 % 12/01/19 0844    Last Pain:  Vitals:   12/01/19 0536  TempSrc:   PainSc: Asleep      Patients Stated Pain Goal: 0 (98/26/41 5830)  Complications: No complications documented.

## 2019-12-01 NOTE — Progress Notes (Signed)
  Echocardiogram 2D Echocardiogram has been performed.  Muhammadali Ries G Davona Kinoshita 12/01/2019, 3:06 PM

## 2019-12-01 NOTE — Anesthesia Postprocedure Evaluation (Signed)
Anesthesia Post Note  Patient: [name]  Procedure(s) Performed: STERNAL WOUND DEBRIDEMENT and WOUND VAC CHANGE (N/A Chest)     Patient location during evaluation: PACU Anesthesia Type: MAC Level of consciousness: awake and alert Pain management: pain level controlled Vital Signs Assessment: post-procedure vital signs reviewed and stable Respiratory status: spontaneous breathing, nonlabored ventilation, respiratory function stable and patient connected to nasal cannula oxygen Cardiovascular status: stable and blood pressure returned to baseline Postop Assessment: no apparent nausea or vomiting Anesthetic complications: no   No complications documented.  Last Vitals:  Vitals:   12/01/19 0910 12/01/19 0915  BP: (!) 146/73 (!) 147/71  Pulse: (!) 52 (!) 51  Resp: 19 17  Temp:  36.6 C  SpO2: 93% 92%    Last Pain:  Vitals:   12/01/19 0915  TempSrc:   PainSc: 0-No pain                 Effie Berkshire

## 2019-12-01 NOTE — Op Note (Signed)
Procedure(s): STERNAL WOUND DEBRIDEMENT and WOUND VAC CHANGE Procedure Note  [name] male 59 y.o. 12/01/2019  Procedure(s) and Anesthesia Type:    * STERNAL WOUND DEBRIDEMENT and WOUND VAC CHANGE - Monitor Anesthesia Care  Surgeon(s) and Role:    * Wonda Olds, MD - Primary   Indications: The patient was admitted to the hospital with a history of CABG in early July 2021. He presented with dehisced lower sternal incision. He is taken to the OR for additional wound debridement and vac change.       Surgeon: Wonda Olds   Assistants: staff  Anesthesia: General LMA anesthesia and Monitored Local Anesthesia with Sedation  ASA Class: 3    Procedure Detail  STERNAL WOUND DEBRIDEMENT and WOUND VAC CHANGE After informed consent, he was taken to the OR and placed supine. The anterior chest was cleansed and draped sterilely. A preoperative pause was performed. The wound was thoroughly debrided with pulse evac irrigation. Very gently excisional debridement of devitalized fat was performed. The lower sternal wire was tightened. Suture material remnants were removed. The tissue was quite viable. A-cell biological material was applied to the wound base. A vac sponge was cut to fit the defect and covered with a vac drape.   Estimated Blood Loss:  less than 50 mL         Drains: no         Blood Given: none          Specimens: no         Implants: none        Complications:  * No complications entered in OR log *         Disposition: PACU - hemodynamically stable.         Condition: stable

## 2019-12-01 NOTE — Progress Notes (Signed)
Pt continues to refuse CPAP. Order discontinued per RT protocol, and machine removed from room at this time. Pt aware that he can request CPAP at any time if he changes his mind.

## 2019-12-01 NOTE — H&P (Signed)
History and Physical Interval Note:  12/01/2019 7:39 AM  [name]  has presented today for surgery, with the diagnosis of STERNAL INFECTION.  The various methods of treatment have been discussed with the patient and family. After consideration of risks, benefits and other options for treatment, the patient has consented to  Procedure(s): STERNAL WOUND DEBRIDEMENT and WOUND VAC CHANGE (N/A) as a surgical intervention.  The patient's history has been reviewed, patient examined, no change in status, stable for surgery.  I have reviewed the patient's chart and labs.  Questions were answered to the patient's satisfaction.     Wonda Olds

## 2019-12-02 ENCOUNTER — Encounter (HOSPITAL_COMMUNITY): Payer: Self-pay | Admitting: Cardiothoracic Surgery

## 2019-12-02 LAB — AEROBIC/ANAEROBIC CULTURE W GRAM STAIN (SURGICAL/DEEP WOUND): Gram Stain: NONE SEEN

## 2019-12-02 LAB — CBC
HCT: 45 % (ref 39.0–52.0)
Hemoglobin: 14.2 g/dL (ref 13.0–17.0)
MCH: 28.7 pg (ref 26.0–34.0)
MCHC: 31.6 g/dL (ref 30.0–36.0)
MCV: 91.1 fL (ref 80.0–100.0)
Platelets: 347 10*3/uL (ref 150–400)
RBC: 4.94 MIL/uL (ref 4.22–5.81)
RDW: 15.7 % — ABNORMAL HIGH (ref 11.5–15.5)
WBC: 11.2 10*3/uL — ABNORMAL HIGH (ref 4.0–10.5)
nRBC: 0 % (ref 0.0–0.2)

## 2019-12-02 LAB — GLUCOSE, CAPILLARY
Glucose-Capillary: 147 mg/dL — ABNORMAL HIGH (ref 70–99)
Glucose-Capillary: 103 mg/dL — ABNORMAL HIGH (ref 70–99)
Glucose-Capillary: 60 mg/dL — ABNORMAL LOW (ref 70–99)
Glucose-Capillary: 96 mg/dL (ref 70–99)
Glucose-Capillary: 167 mg/dL — ABNORMAL HIGH (ref 70–99)

## 2019-12-02 MED ORDER — DIPHENHYDRAMINE HCL 25 MG PO CAPS: 25.0000 mg | ORAL_CAPSULE | Freq: Four times a day (QID) | ORAL | Status: DC | PRN

## 2019-12-02 MED ORDER — LOSARTAN POTASSIUM 25 MG PO TABS
25.0000 mg | ORAL_TABLET | Freq: Every day | ORAL | Status: DC
  Administered 2019-12-02: 25 mg via ORAL
  Filled 2019-12-02: qty 1

## 2019-12-02 MED ORDER — OXYCODONE HCL 5 MG PO TABS
5.0000 mg | ORAL_TABLET | ORAL | Status: DC | PRN
  Administered 2019-12-02 – 2019-12-06 (×14): 5 mg via ORAL
  Filled 2019-12-02 (×15): qty 1

## 2019-12-02 MED ORDER — FENTANYL CITRATE (PF) 100 MCG/2ML IJ SOLN
25.0000 ug | INTRAMUSCULAR | Status: DC | PRN
  Administered 2019-12-06 – 2019-12-18 (×7): 25 ug via INTRAVENOUS
  Filled 2019-12-02 (×7): qty 2

## 2019-12-02 MED ORDER — METOPROLOL SUCCINATE ER 25 MG PO TB24
25.0000 mg | ORAL_TABLET | Freq: Every day | ORAL | Status: DC
  Administered 2019-12-03 – 2019-12-18 (×16): 25 mg via ORAL
  Filled 2019-12-02 (×16): qty 1

## 2019-12-02 NOTE — Progress Notes (Signed)
Hypoglycemic Event  CBG: 60  Treatment: juice provided to pt  Symptoms: shakiness   Follow-up CBG: Time:1656 CBG Result: 96  Possible Reasons for Event: pt reported minimal lunch intake  Comments/MD notified: N/A   [name]

## 2019-12-02 NOTE — Progress Notes (Signed)
Placed patient on CPAP for the night via auto-mode with oxygen set at 2lpm  

## 2019-12-02 NOTE — Progress Notes (Addendum)
      [address]       [name] [address]             [phone]        1 Day Post-Op Procedure(s) (LRB): STERNAL WOUND DEBRIDEMENT and WOUND VAC CHANGE (N/A)  Subjective: Patient without complaints this am. He hopes to go home.  Objective: Vital signs in last 24 hours: Temp:  [97.3 F (36.3 C)-98.1 F (36.7 C)] 98.1 F (36.7 C) (08/11 0435) Pulse Rate:  [49-72] 60 (08/11 0435) Cardiac Rhythm: Sinus bradycardia (08/10 2000) Resp:  [14-20] 20 (08/11 0435) BP: (108-181)/(57-87) 159/79 (08/11 0435) SpO2:  [92 %-97 %] 97 % (08/11 0435) Weight:  [79.7 kg] 79.7 kg (08/11 0316)   Current Weight  12/02/19 79.7 kg        Intake/Output from previous day: 08/10 0701 - 08/11 0700 In: 1113 [P.O.:360; I.V.:553; IV Piggyback:200] Out: 2590 [Urine:2450; Drains:140]   Physical Exam:  Cardiovascular: RRR Pulmonary: Clear to auscultation bilaterall Abdomen: Soft, non tender, bowel sounds present. Extremities: No lower extremity edema. Wounds: Eschars on chest tube wounds. VAC on sternal wound with blood drainage around it  Lab Results: CBC: Recent Labs    12/01/19 0056 12/02/19 0429  WBC 10.7* 11.2*  HGB 13.8 14.2  HCT 44.4 45.0  PLT 333 347   BMET:  Recent Labs    12/01/19 0056  NA 136  K 4.8  CL 100  CO2 23  GLUCOSE 155*  BUN 26*  CREATININE 1.13  CALCIUM 8.9    PT/INR:  Lab Results  Component Value Date   INR 1.3 (H) 11/26/2019   INR 1.5 (H) 10/29/2019   INR 1.2 10/29/2019   ABG:  INR: Will add last result for INR, ABG once components are confirmed Will add last 4 CBG results once components are confirmed  Assessment/Plan:  1. CV - SR, first degree heart block. SB with HR in the 50's this am.On Lopressor 12.5 mg bid, Amiodarone 200 mg daily, Isordil 10 mg tid, Plavix 75 mg daily, ec asa 81 mg daily, Apixaban 5 mg bid. Will stop Amiodarone. Per HF pharmacy recommendation, will change Lopressor to Toprol XL and will start low  dose Losartan. Will monitor creatinine. Will consider starting Spironolactone in next few days and stopping Lasix. 2.  Pulmonary - On 2 liters of oxygen via [place] at night (was on prior) 3. ID-Klebsiella Oxytoca. On Cefipime for sternal wound. Per Dr. Orvan Seen, will change to oral Cipro at discharge.  4. Volume overload-on Lasix 40 mg daily. 5. DM-CBGs 87/177/147. On Metformin 1000 mg bid. Will restart Jardiance and Trulicity at discharge 6. Regarding pain control, patient states he is able to take Oxy and it helps with pain better than Ultram or oral Dilaudid. Will give Oxy and Benadryl PRN 7. As discussed with Dr. Orvan Seen, patient needs a few more days of IV Cefepime. Wound VAC change on Friday. Disposition will be determined at that time.  Annamay Laymon M ZimmermanPA-C 12/02/2019,7:10 AM

## 2019-12-03 LAB — GLUCOSE, CAPILLARY
Glucose-Capillary: 68 mg/dL — ABNORMAL LOW (ref 70–99)
Glucose-Capillary: 128 mg/dL — ABNORMAL HIGH (ref 70–99)
Glucose-Capillary: 196 mg/dL — ABNORMAL HIGH (ref 70–99)
Glucose-Capillary: 104 mg/dL — ABNORMAL HIGH (ref 70–99)
Glucose-Capillary: 153 mg/dL — ABNORMAL HIGH (ref 70–99)

## 2019-12-03 LAB — BASIC METABOLIC PANEL
Anion gap: 11 (ref 5–15)
BUN: 18 mg/dL (ref 6–20)
CO2: 30 mmol/L (ref 22–32)
Calcium: 9.4 mg/dL (ref 8.9–10.3)
Chloride: 98 mmol/L (ref 98–111)
Creatinine, Ser: 1 mg/dL (ref 0.61–1.24)
GFR calc Af Amer: >60 mL/min (ref >60)
GFR calc non Af Amer: >60 mL/min (ref >60)
Glucose, Bld: 75 mg/dL (ref 70–99)
Potassium: 4.6 mmol/L (ref 3.5–5.1)
Sodium: 139 mmol/L (ref 135–145)

## 2019-12-03 MED ORDER — LOSARTAN POTASSIUM 50 MG PO TABS
50.0000 mg | ORAL_TABLET | Freq: Every day | ORAL | Status: DC
  Administered 2019-12-03 – 2019-12-07 (×5): 50 mg via ORAL
  Filled 2019-12-03 (×5): qty 1

## 2019-12-03 NOTE — Progress Notes (Signed)
      [address]       [place] [address]             [phone]      2 Days Post-Op Procedure(s) (LRB): STERNAL WOUND DEBRIDEMENT and WOUND VAC CHANGE (N/A)   Subjective:  No specific complaints.  States he is doing okay.  Objective: Vital signs in last 24 hours: Temp:  [97.6 F (36.4 C)-98.5 F (36.9 C)] 97.7 F (36.5 C) (08/12 0518) Pulse Rate:  [60-85] 62 (08/11 2312) Cardiac Rhythm: Sinus bradycardia;Heart block (08/11 1900) Resp:  [16-20] 20 (08/12 0518) BP: (138-170)/(59-83) 158/83 (08/12 0518) SpO2:  [92 %-97 %] 96 % (08/12 0518)  Intake/Output from previous day: 08/11 0701 - 08/12 0700 In: 203 [I.V.:3; IV Piggyback:200] Out: 725 [Urine:700; Drains:25]  General appearance: alert, cooperative and no distress Heart: regular rate and rhythm Lungs: clear to auscultation bilaterally Abdomen: soft, non-tender; bowel sounds normal; no masses,  no organomegaly Extremities: extremities normal, atraumatic, no cyanosis or edema Wound: wound vac in place on sternotomy, no surrounding erythema, left radial site with black eschar present, minimal erythema in area where skin has peeled  Lab Results: Recent Labs    12/01/19 0056 12/02/19 0429  WBC 10.7* 11.2*  HGB 13.8 14.2  HCT 44.4 45.0  PLT 333 347   BMET:  Recent Labs    12/01/19 0056 12/03/19 0236  NA 136 139  K 4.8 4.6  CL 100 98  CO2 23 30  GLUCOSE 155* 75  BUN 26* 18  CREATININE 1.13 1.00  CALCIUM 8.9 9.4    PT/INR: No results for input(s): LABPROT, INR in the last 72 hours. ABG    Component Value Date/Time   PHART 7.271 (L) 10/30/2019 0506   HCO3 22.2 10/30/2019 0506   TCO2 24 10/30/2019 0506   ACIDBASEDEF 5.0 (H) 10/30/2019 0506   O2SAT 58.3 11/03/2019 0314   CBG (last 3)  Recent Labs    12/02/19 1656 12/02/19 2153 12/03/19 0628  GLUCAP 96 167* 68*    Assessment/Plan: S/P Procedure(s) (LRB): STERNAL WOUND DEBRIDEMENT and WOUND VAC CHANGE (N/A)  1. 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 Pulm- no acute issues, off oxygen continue IS 3. ID- Klebsiella Oxytoca on culture- continue ABX for now, will transition to CIPRO at discharge, for wound vac change in OR tomorrow 4. Renal- weight is at baseline, K is WNL, creatinine WNL, on Lasix 5. Dispo- patient stable, increase Cozaar for better BP control, continue IV ABX, for wound vac change tomorrow, continue current care   LOS: 7 days    Ellwood Handler, PA-C 12/03/2019

## 2019-12-03 NOTE — Plan of Care (Signed)
Continue to monitor

## 2019-12-04 ENCOUNTER — Inpatient Hospital Stay (HOSPITAL_COMMUNITY): Payer: Medicaid Other | Admitting: Certified Registered Nurse Anesthetist

## 2019-12-04 ENCOUNTER — Ambulatory Visit: Payer: Medicaid Other | Admitting: Cardiothoracic Surgery

## 2019-12-04 ENCOUNTER — Encounter (HOSPITAL_COMMUNITY)
Admission: AD | Disposition: A | Discharge status: Home Health | Payer: Self-pay | Source: Ambulatory Visit | Attending: Cardiothoracic Surgery

## 2019-12-04 HISTORY — PX: APPLICATION OF A-CELL OF CHEST/ABDOMEN: SHX6302

## 2019-12-04 HISTORY — PX: STERNAL WOUND DEBRIDEMENT: SHX1058

## 2019-12-04 LAB — SURGICAL PCR SCREEN
MRSA, PCR: NEGATIVE
Staphylococcus aureus: NEGATIVE

## 2019-12-04 LAB — GLUCOSE, CAPILLARY
Glucose-Capillary: 166 mg/dL — ABNORMAL HIGH (ref 70–99)
Glucose-Capillary: 87 mg/dL (ref 70–99)
Glucose-Capillary: 77 mg/dL (ref 70–99)
Glucose-Capillary: 90 mg/dL (ref 70–99)
Glucose-Capillary: 87 mg/dL (ref 70–99)

## 2019-12-04 SURGERY — DEBRIDEMENT, WOUND, STERNUM
Anesthesia: Monitor Anesthesia Care | Site: Chest

## 2019-12-04 MED ORDER — SODIUM CHLORIDE 0.9 % IR SOLN
Status: DC | PRN
  Administered 2019-12-04: 1000 mL

## 2019-12-04 MED ORDER — MEPERIDINE HCL 25 MG/ML IJ SOLN: 6.2500 mg | INTRAMUSCULAR | Status: DC | PRN

## 2019-12-04 MED ORDER — ONDANSETRON HCL 4 MG/2ML IJ SOLN
INTRAMUSCULAR | Status: AC
  Filled 2019-12-04: qty 2

## 2019-12-04 MED ORDER — PROPOFOL 500 MG/50ML IV EMUL
INTRAVENOUS | Status: DC | PRN
  Administered 2019-12-04: 100 ug/kg/min via INTRAVENOUS

## 2019-12-04 MED ORDER — PHENYLEPHRINE HCL-NACL 10-0.9 MG/250ML-% IV SOLN
INTRAVENOUS | Status: DC | PRN
  Administered 2019-12-04: 15 ug/min via INTRAVENOUS

## 2019-12-04 MED ORDER — HYDROMORPHONE HCL 1 MG/ML IJ SOLN
0.2500 mg | INTRAMUSCULAR | Status: DC | PRN
  Administered 2019-12-04: 0.5 mg via INTRAVENOUS

## 2019-12-04 MED ORDER — ALUM & MAG HYDROXIDE-SIMETH 200-200-20 MG/5ML PO SUSP
30.0000 mL | ORAL | Status: DC | PRN
  Administered 2019-12-04: 30 mL via ORAL
  Filled 2019-12-04: qty 30

## 2019-12-04 MED ORDER — SODIUM CHLORIDE 0.9 % IR SOLN
Status: DC | PRN
  Administered 2019-12-04: 3000 mL

## 2019-12-04 MED ORDER — ONDANSETRON HCL 4 MG/2ML IJ SOLN
INTRAMUSCULAR | Status: DC | PRN
  Administered 2019-12-04: 4 mg via INTRAVENOUS

## 2019-12-04 MED ORDER — FENTANYL CITRATE (PF) 100 MCG/2ML IJ SOLN
INTRAMUSCULAR | Status: DC | PRN
  Administered 2019-12-04: 50 ug via INTRAVENOUS
  Administered 2019-12-04 (×3): 25 ug via INTRAVENOUS

## 2019-12-04 MED ORDER — MIDAZOLAM HCL 2 MG/2ML IJ SOLN
INTRAMUSCULAR | Status: AC
  Filled 2019-12-04: qty 2

## 2019-12-04 MED ORDER — LACTATED RINGERS IV SOLN: INTRAVENOUS | Status: DC | PRN

## 2019-12-04 MED ORDER — FENTANYL CITRATE (PF) 250 MCG/5ML IJ SOLN
INTRAMUSCULAR | Status: AC
  Filled 2019-12-04: qty 5

## 2019-12-04 MED ORDER — HYDROMORPHONE HCL 1 MG/ML IJ SOLN
INTRAMUSCULAR | Status: AC
  Filled 2019-12-04: qty 1

## 2019-12-04 MED ORDER — PROPOFOL 10 MG/ML IV BOLUS
INTRAVENOUS | Status: DC | PRN
  Administered 2019-12-04: 40 mg via INTRAVENOUS

## 2019-12-04 MED ORDER — MIDAZOLAM HCL 5 MG/5ML IJ SOLN
INTRAMUSCULAR | Status: DC | PRN
  Administered 2019-12-04 (×2): 1 mg via INTRAVENOUS

## 2019-12-04 SURGICAL SUPPLY — 64 items
APL SKNCLS STERI-STRIP NONHPOA (GAUZE/BANDAGES/DRESSINGS) ×2
BAG DECANTER FOR FLEXI CONT (MISCELLANEOUS) ×3 IMPLANT
BANDAGE ESMARK 6X9 LF (GAUZE/BANDAGES/DRESSINGS) IMPLANT
BENZOIN TINCTURE PRP APPL 2/3 (GAUZE/BANDAGES/DRESSINGS) ×1 IMPLANT
BLADE CLIPPER SURG (BLADE) ×3 IMPLANT
BLADE SURG 10 STRL SS (BLADE) ×3 IMPLANT
BNDG CMPR 9X6 STRL LF SNTH (GAUZE/BANDAGES/DRESSINGS)
BNDG ESMARK 6X9 LF (GAUZE/BANDAGES/DRESSINGS)
BNDG GAUZE ELAST 4 BULKY (GAUZE/BANDAGES/DRESSINGS) IMPLANT
BRUSH SCRUB EZ PLAIN DRY (MISCELLANEOUS) ×2 IMPLANT
CANISTER SUCT 3000ML PPV (MISCELLANEOUS) ×3 IMPLANT
CANISTER WOUND CARE 500ML ATS (WOUND CARE) ×3 IMPLANT
CATH FOLEY 2WAY SLVR  5CC 16FR (CATHETERS)
CATH FOLEY 2WAY SLVR 5CC 16FR (CATHETERS) IMPLANT
CLIP VESOCCLUDE SM WIDE 24/CT (CLIP) IMPLANT
CNTNR URN SCR LID CUP LEK RST (MISCELLANEOUS) IMPLANT
CONT SPEC 4OZ STRL OR WHT (MISCELLANEOUS)
COVER SURGICAL LIGHT HANDLE (MISCELLANEOUS) ×5 IMPLANT
DRAPE CHEST BREAST 15X10 FENES (DRAPES) ×1 IMPLANT
DRAPE INCISE IOBAN 66X45 STRL (DRAPES) IMPLANT
DRAPE LAPAROSCOPIC ABDOMINAL (DRAPES) ×2 IMPLANT
DRAPE WARM FLUID 44X44 (DRAPES) IMPLANT
DRSG ADAPTIC 3X8 NADH LF (GAUZE/BANDAGES/DRESSINGS) ×1 IMPLANT
DRSG AQUACEL AG ADV 3.5X14 (GAUZE/BANDAGES/DRESSINGS) ×2 IMPLANT
DRSG VAC ATS LRG SENSATRAC (GAUZE/BANDAGES/DRESSINGS) IMPLANT
DRSG VAC ATS MED SENSATRAC (GAUZE/BANDAGES/DRESSINGS) ×1 IMPLANT
DRSG VAC ATS SM SENSATRAC (GAUZE/BANDAGES/DRESSINGS) IMPLANT
ELECT REM PT RETURN 9FT ADLT (ELECTROSURGICAL) ×3
ELECTRODE REM PT RTRN 9FT ADLT (ELECTROSURGICAL) ×2 IMPLANT
GAUZE SPONGE 4X4 12PLY STRL (GAUZE/BANDAGES/DRESSINGS) ×3 IMPLANT
GAUZE XEROFORM 5X9 LF (GAUZE/BANDAGES/DRESSINGS) IMPLANT
GLOVE NEODERM STRL 7.5  LF PF (GLOVE) ×2
GLOVE NEODERM STRL 7.5 LF PF (GLOVE) ×2 IMPLANT
GLOVE SURG NEODERM 7.5  LF PF (GLOVE) ×1
GOWN STRL REUS W/ TWL LRG LVL3 (GOWN DISPOSABLE) ×6 IMPLANT
GOWN STRL REUS W/TWL LRG LVL3 (GOWN DISPOSABLE) ×9
HANDPIECE INTERPULSE COAX TIP (DISPOSABLE) ×3
HEMOSTAT POWDER SURGIFOAM 1G (HEMOSTASIS) IMPLANT
HEMOSTAT SURGICEL 2X14 (HEMOSTASIS) IMPLANT
IV NS IRRIG 3000ML ARTHROMATIC (IV SOLUTION) ×2 IMPLANT
KIT BASIN OR (CUSTOM PROCEDURE TRAY) ×3 IMPLANT
KIT TURNOVER KIT B (KITS) ×3 IMPLANT
MATRIX WOUND 3-LAYER 10X15 (Tissue) ×1 IMPLANT
MICROMATRIX 1000MG (Tissue) ×3 IMPLANT
NS IRRIG 1000ML POUR BTL (IV SOLUTION) ×3 IMPLANT
PACK GENERAL/GYN (CUSTOM PROCEDURE TRAY) ×3 IMPLANT
PAD ARMBOARD 7.5X6 YLW CONV (MISCELLANEOUS) ×6 IMPLANT
PAD NEG PRESSURE SENSATRAC (MISCELLANEOUS) ×1 IMPLANT
SET HNDPC FAN SPRY TIP SCT (DISPOSABLE) IMPLANT
SOL PREP POV-IOD 4OZ 10% (MISCELLANEOUS) IMPLANT
SOLUTION PARTIC MCRMTRX 1000MG (Tissue) IMPLANT
SPONGE LAP 18X18 RF (DISPOSABLE) ×2 IMPLANT
STAPLER VISISTAT 35W (STAPLE) IMPLANT
SUT ETHILON 3 0 FSL (SUTURE) IMPLANT
SUT PDS AB 1 CTX 36 (SUTURE) IMPLANT
SUT PROLENE 2 0 MH 48 (SUTURE) IMPLANT
SUT VIC AB 2-0 CTX 27 (SUTURE) ×4 IMPLANT
SWAB COLLECTION DEVICE MRSA (MISCELLANEOUS) IMPLANT
SWAB CULTURE ESWAB REG 1ML (MISCELLANEOUS) IMPLANT
SYR 5ML LL (SYRINGE) IMPLANT
SYR TOOMEY 50ML (SYRINGE) ×1 IMPLANT
TOWEL GREEN STERILE (TOWEL DISPOSABLE) ×3 IMPLANT
TOWEL GREEN STERILE FF (TOWEL DISPOSABLE) ×3 IMPLANT
WATER STERILE IRR 1000ML POUR (IV SOLUTION) ×3 IMPLANT

## 2019-12-04 NOTE — Brief Op Note (Signed)
12/04/2019  3:22 PM  PATIENT:  [name]  59 y.o. male  PRE-OPERATIVE DIAGNOSIS:  Sternal wound infection  POST-OPERATIVE DIAGNOSIS:  Sternal wound infection  PROCEDURE:  Procedure(s): STERNAL WOUND DEBRIDEMENT and WOUND VAC CHANGE (N/A) APPLICATION OF A-CELL OF CHEST (N/A)  SURGEON:  Surgeon(s) and Role:    * Wonda Olds, MD - Primary  PHYSICIAN ASSISTANT: n/a  ASSISTANTS: staff   ANESTHESIA:   general  EBL:  25 mL   BLOOD ADMINISTERED:none  DRAINS: none   LOCAL MEDICATIONS USED:  NONE  SPECIMEN:  No Specimen  DISPOSITION OF SPECIMEN:  N/A  COUNTS:  YES  TOURNIQUET:  * No tourniquets in log *  DICTATION: .Note written in EPIC  PLAN OF CARE: Admit to inpatient   PATIENT DISPOSITION:  PACU - hemodynamically stable.   Delay start of Pharmacological VTE agent (>24hrs) due to surgical blood loss or risk of bleeding: no

## 2019-12-04 NOTE — Transfer of Care (Signed)
Immediate Anesthesia Transfer of Care Note  Patient: [name]  Procedure(s) Performed: STERNAL WOUND DEBRIDEMENT and WOUND VAC CHANGE (N/A ) APPLICATION OF A-CELL OF CHEST (N/A Chest)  Patient Location: PACU  Anesthesia Type:MAC  Level of Consciousness: awake, alert  and oriented  Airway & Oxygen Therapy: Patient Spontanous Breathing and Patient connected to face mask oxygen  Post-op Assessment: Report given to RN, Post -op Vital signs reviewed and stable and Patient moving all extremities  Post vital signs: Reviewed and stable  Last Vitals:  Vitals Value Taken Time  BP 111/68 12/04/19 0848  Temp    Pulse 54 12/04/19 0854  Resp 18 12/04/19 0854  SpO2 99 % 12/04/19 0854  Vitals shown include unvalidated device data.  Last Pain:  Vitals:   12/04/19 0515  TempSrc:   PainSc: 6       Patients Stated Pain Goal: 0 (32/67/12 4580)  Complications: No complications documented.

## 2019-12-04 NOTE — Progress Notes (Addendum)
Patient back from PACU at this time. Telemetry applied and CCMD notified. V/s and assessment complete. Wound vac clean dry and intact. Scant amount of drainage noted in canister. Patient alert and oriented. Call bell within reach. Will continue to monitor

## 2019-12-04 NOTE — Anesthesia Postprocedure Evaluation (Signed)
Anesthesia Post Note  Patient: [name]  Procedure(s) Performed: STERNAL WOUND DEBRIDEMENT and WOUND VAC CHANGE (N/A ) APPLICATION OF A-CELL OF CHEST (N/A Chest)     Patient location during evaluation: PACU Anesthesia Type: MAC Level of consciousness: awake and alert Pain management: pain level controlled Vital Signs Assessment: post-procedure vital signs reviewed and stable Respiratory status: spontaneous breathing, nonlabored ventilation, respiratory function stable and patient connected to nasal cannula oxygen Cardiovascular status: stable and blood pressure returned to baseline Postop Assessment: no apparent nausea or vomiting Anesthetic complications: no   No complications documented.  Last Vitals:  Vitals:   12/04/19 1017 12/04/19 1200  BP: (!) 143/78 (!) 151/78  Pulse: (!) 58 61  Resp: 16 14  Temp: 36.8 C   SpO2: 93% 92%    Last Pain:  Vitals:   12/04/19 1017  TempSrc: Oral  PainSc:                  [name] DAVID

## 2019-12-04 NOTE — Plan of Care (Signed)
Continue to monitor

## 2019-12-04 NOTE — Anesthesia Preprocedure Evaluation (Addendum)
Anesthesia Evaluation  Patient identified by MRN, date of birth, ID band Patient awake    Reviewed: Allergy & Precautions, NPO status , Patient's Chart, lab work & pertinent test results  Airway Mallampati: I  TM Distance: >3 FB Neck ROM: Full    Dental   Pulmonary Patient abstained from smoking., former smoker,    Pulmonary exam normal        Cardiovascular hypertension, Pt. on medications + CAD and + Past MI  Normal cardiovascular exam     Neuro/Psych Anxiety    GI/Hepatic   Endo/Other  diabetes, Type 2, Oral Hypoglycemic Agents  Renal/GU      Musculoskeletal   Abdominal   Peds  Hematology   Anesthesia Other Findings   Reproductive/Obstetrics                             Anesthesia Physical Anesthesia Plan  ASA: III  Anesthesia Plan: MAC   Post-op Pain Management:    Induction: Intravenous  PONV Risk Score and Plan: 1 and Ondansetron  Airway Management Planned: Nasal Cannula  Additional Equipment:   Intra-op Plan:   Post-operative Plan:   Informed Consent: I have reviewed the patients History and Physical, chart, labs and discussed the procedure including the risks, benefits and alternatives for the proposed anesthesia with the patient or authorized representative who has indicated his/her understanding and acceptance.       Plan Discussed with: CRNA and Surgeon  Anesthesia Plan Comments:         Anesthesia Quick Evaluation

## 2019-12-04 NOTE — Anesthesia Procedure Notes (Signed)
Procedure Name: MAC Date/Time: 12/04/2019 7:49 AM Performed by: Leonor Liv, CRNA Pre-anesthesia Checklist: Patient identified, Emergency Drugs available, Suction available, Patient being monitored and Timeout performed Patient Re-evaluated:Patient Re-evaluated prior to induction Oxygen Delivery Method: Simple face mask Placement Confirmation: positive ETCO2 Dental Injury: Teeth and Oropharynx as per pre-operative assessment

## 2019-12-04 NOTE — Progress Notes (Signed)
RT offered to place pt on CPAP for the night and pt declined stating he was doing fine without it tonight and would be ok. Pt respiratory status stable on [place] 2 Lpm w/no distress noted at this time. RT will continue to monitor.

## 2019-12-04 NOTE — Progress Notes (Signed)
Mobility Specialist: Progress Note    12/04/19 1627  Mobility  Activity Dangled on edge of bed  Level of Assistance Minimal assist, patient does 75% or more  Assistive Device None  Mobility Response Tolerated poorly  Mobility performed by Mobility specialist  Bed Position Semi-fowlers  $Mobility charge 1 Mobility   Pt was hesitant to ambulate but was agreeable to try. After sitting on EOB pt started a persistent cough and c/o of CP, nurse notified. After pt returned to bed his coughing ceased. I got a pillow for the pt and told him to use it to brace his sternum when he coughs. Decided not to ambulate but pt wants to try again tomorrow.   [facility] Yuriel Lopezmartinez Mobility Specialist

## 2019-12-04 NOTE — Progress Notes (Signed)
Report called to Anesth. [phone].

## 2019-12-04 NOTE — H&P (Signed)
History and Physical Interval Note:  12/04/2019 7:42 AM  [name]  has presented today for surgery, with the diagnosis of Sternal wound infection.  The various methods of treatment have been discussed with the patient and family. After consideration of risks, benefits and other options for treatment, the patient has consented to  Procedure(s): STERNAL WOUND DEBRIDEMENT and WOUND VAC CHANGE (N/A) as a surgical intervention.  The patient's history has been reviewed, patient examined, no change in status, stable for surgery.  I have reviewed the patient's chart and labs.  Questions were answered to the patient's satisfaction.     Wonda Olds

## 2019-12-05 LAB — GLUCOSE, CAPILLARY
Glucose-Capillary: 53 mg/dL — ABNORMAL LOW (ref 70–99)
Glucose-Capillary: 73 mg/dL (ref 70–99)
Glucose-Capillary: 120 mg/dL — ABNORMAL HIGH (ref 70–99)
Glucose-Capillary: 202 mg/dL — ABNORMAL HIGH (ref 70–99)
Glucose-Capillary: 95 mg/dL (ref 70–99)

## 2019-12-05 LAB — C-REACTIVE PROTEIN: CRP: 6.8 mg/dL — ABNORMAL HIGH (ref <1.0)

## 2019-12-05 MED ORDER — INSULIN DETEMIR 100 UNIT/ML ~~LOC~~ SOLN
17.0000 [IU] | Freq: Two times a day (BID) | SUBCUTANEOUS | Status: DC
  Administered 2019-12-05 – 2019-12-18 (×21): 17 [IU] via SUBCUTANEOUS
  Filled 2019-12-05 (×29): qty 0.17

## 2019-12-05 NOTE — Progress Notes (Signed)
RT offered to place pt on CPAP dream station for the night and pt declined stating he was fine without it. Respiratory status is stable on 2 Lpm [place] w/no distress noted. RT will continue to monitor.

## 2019-12-05 NOTE — Progress Notes (Signed)
Pt. Refused cpap. 

## 2019-12-05 NOTE — Progress Notes (Signed)
Hypoglycemic Event  CBG: 53  Treatment: OJ 4 ounces   Symptoms: None  Follow-up CBG: Time:6:42 CBG Result:73  Possible Reasons for Event: Poor POs  Comments/MD notified:No   Elicia Lamp

## 2019-12-05 NOTE — Progress Notes (Addendum)
      [address]       [place] [address]             [phone]      1 Day Post-Op Procedure(s) (LRB): STERNAL WOUND DEBRIDEMENT and WOUND VAC CHANGE (N/A) APPLICATION OF A-CELL OF CHEST (N/A) Subjective: Feels okay this morning. He is having some incisional pain.   Objective: Vital signs in last 24 hours: Temp:  [97 F (36.1 C)-98.7 F (37.1 C)] 97.9 F (36.6 C) (08/14 0330) Pulse Rate:  [55-69] 64 (08/14 0330) Cardiac Rhythm: Heart block (08/14 0735) Resp:  [0-19] 18 (08/14 0330) BP: (135-153)/(68-78) 149/70 (08/14 0330) SpO2:  [92 %-99 %] 93 % (08/14 0330) Weight:  [77.6 kg] 77.6 kg (08/14 0329)     Intake/Output from previous day: 08/13 0701 - 08/14 0700 In: 900 [I.V.:500; IV Piggyback:400] Out: 2975 [Urine:2850; Drains:100; Blood:25] Intake/Output this shift: Total I/O In: -  Out: 600 [Urine:600]  General appearance: alert, cooperative and no distress Heart: regular rate and rhythm, S1, S2 normal, no murmur, click, rub or gallop Lungs: clear to auscultation bilaterally Abdomen: soft, non-tender; bowel sounds normal; no masses,  no organomegaly Extremities: extremities normal, atraumatic, no cyanosis or edema Wound: good seal on the wound vac dressing with some drainage  Lab Results: No results for input(s): WBC, HGB, HCT, PLT in the last 72 hours. BMET:  Recent Labs    12/03/19 0236  NA 139  K 4.6  CL 98  CO2 30  GLUCOSE 75  BUN 18  CREATININE 1.00  CALCIUM 9.4    PT/INR: No results for input(s): LABPROT, INR in the last 72 hours. ABG    Component Value Date/Time   PHART 7.271 (L) 10/30/2019 0506   HCO3 22.2 10/30/2019 0506   TCO2 24 10/30/2019 0506   ACIDBASEDEF 5.0 (H) 10/30/2019 0506   O2SAT 58.3 11/03/2019 0314   CBG (last 3)  Recent Labs    12/04/19 2133 12/05/19 0609 12/05/19 0642  GLUCAP 87 53* 73    Assessment/Plan: S/P Procedure(s) (LRB): STERNAL WOUND DEBRIDEMENT and WOUND VAC CHANGE  (N/A) APPLICATION OF A-CELL OF CHEST (N/A)  1. CV-BP starting to climb. ACEI/ARB allergy. Will consider Norvasc if continues to trend up. Continue plavix and Elliquis no asa. 2. Pulm-tolerating 2L [place]. Weaning as able 3. ID-no recent fevers, appears to only have had post-op abx. WBC 11.2 4. Renal-creatinine 1.00, electrolytes okay 5. Blood glucose low at times. Will decrease Levemir.   Plan: Continue wound vac therapy. Some drainage noted from the sternum. Weaning oxygen as able. Encouraged to ambulate at least twice in the halls today since he did not ambulate yesterday. Encouraged incentive spirometer use hourly. Paint radial site with betadine.   LOS: 9 days    Elgie Collard 12/05/2019  Agree with above Continue wound care  [place]

## 2019-12-06 LAB — GLUCOSE, CAPILLARY
Glucose-Capillary: 79 mg/dL (ref 70–99)
Glucose-Capillary: 102 mg/dL — ABNORMAL HIGH (ref 70–99)
Glucose-Capillary: 135 mg/dL — ABNORMAL HIGH (ref 70–99)
Glucose-Capillary: 99 mg/dL (ref 70–99)
Glucose-Capillary: 123 mg/dL — ABNORMAL HIGH (ref 70–99)

## 2019-12-06 MED ORDER — OXYCODONE HCL 5 MG PO TABS
5.0000 mg | ORAL_TABLET | ORAL | Status: DC | PRN
  Administered 2019-12-06 – 2019-12-09 (×12): 10 mg via ORAL
  Administered 2019-12-09: 5 mg via ORAL
  Administered 2019-12-10 – 2019-12-18 (×32): 10 mg via ORAL
  Filled 2019-12-06 (×16): qty 2
  Filled 2019-12-06: qty 1
  Filled 2019-12-06 (×28): qty 2

## 2019-12-06 NOTE — Progress Notes (Addendum)
      [address]       [place] [address]             [phone]      2 Days Post-Op Procedure(s) (LRB): STERNAL WOUND DEBRIDEMENT and WOUND VAC CHANGE (N/A) APPLICATION OF A-CELL OF CHEST (N/A) Subjective: Feels okay today. His pain is a little worse this morning in his incision.   Objective: Vital signs in last 24 hours: Temp:  [97.7 F (36.5 C)-98.4 F (36.9 C)] 97.8 F (36.6 C) (08/15 0438) Pulse Rate:  [56-65] 58 (08/15 0438) Cardiac Rhythm: Normal sinus rhythm;Heart block (08/14 1900) Resp:  [13-20] 15 (08/15 0438) BP: (123-157)/(61-76) 147/69 (08/15 0438) SpO2:  [93 %-100 %] 100 % (08/15 0438) Weight:  [76.6 kg] 76.6 kg (08/15 0438)     Intake/Output from previous day: 08/14 0701 - 08/15 0700 In: 240 [P.O.:240] Out: 1825 [Urine:1825] Intake/Output this shift: No intake/output data recorded.  General appearance: alert, cooperative and no distress Heart: regular rate and rhythm, S1, S2 normal, no murmur, click, rub or gallop Lungs: clear to auscultation bilaterally Abdomen: soft, non-tender; bowel sounds normal; no masses,  no organomegaly Extremities: extremities normal, atraumatic, no cyanosis or edema Wound: wound vac with good suction  Lab Results: No results for input(s): WBC, HGB, HCT, PLT in the last 72 hours. BMET: No results for input(s): NA, K, CL, CO2, GLUCOSE, BUN, CREATININE, CALCIUM in the last 72 hours.  PT/INR: No results for input(s): LABPROT, INR in the last 72 hours. ABG    Component Value Date/Time   PHART 7.271 (L) 10/30/2019 0506   HCO3 22.2 10/30/2019 0506   TCO2 24 10/30/2019 0506   ACIDBASEDEF 5.0 (H) 10/30/2019 0506   O2SAT 58.3 11/03/2019 0314   CBG (last 3)  Recent Labs    12/05/19 1659 12/05/19 2246 12/06/19 0614  GLUCAP 202* 95 79    Assessment/Plan: S/P Procedure(s) (LRB): STERNAL WOUND DEBRIDEMENT and WOUND VAC CHANGE (N/A) APPLICATION OF A-CELL OF CHEST (N/A)  1. CV-BP starting to climb.  ACEI/ARB allergy. Will consider Norvasc if continues to trend up. Continue plavix and Elliquis no asa. 2. Pulm-tolerating 2L [place]. Weaning as able 3. ID-no recent fevers, appears to only have had post-op abx. WBC 11.2 4. Renal-creatinine 1.00, electrolytes okay 5. Blood glucose improved. Continue insulin coverage.  Plan: He is overall doing well. His pain was a little this morning and per nursing he was not using sternal precautions when getting out of bed. Will increase oxycodone to 5-10mg  q 4 hours and try to limit Fentanyl.    LOS: 10 days    Elgie Collard 12/06/2019  Agree with above Continue wound care  [place]

## 2019-12-07 ENCOUNTER — Encounter (HOSPITAL_COMMUNITY): Payer: Self-pay | Admitting: Cardiothoracic Surgery

## 2019-12-07 ENCOUNTER — Ambulatory Visit: Payer: Medicaid Other | Admitting: Surgery

## 2019-12-07 LAB — GLUCOSE, CAPILLARY
Glucose-Capillary: 108 mg/dL — ABNORMAL HIGH (ref 70–99)
Glucose-Capillary: 197 mg/dL — ABNORMAL HIGH (ref 70–99)
Glucose-Capillary: 62 mg/dL — ABNORMAL LOW (ref 70–99)
Glucose-Capillary: 62 mg/dL — ABNORMAL LOW (ref 70–99)
Glucose-Capillary: 111 mg/dL — ABNORMAL HIGH (ref 70–99)
Glucose-Capillary: 148 mg/dL — ABNORMAL HIGH (ref 70–99)

## 2019-12-07 NOTE — Progress Notes (Signed)
Hypoglycemic Event  CBG: 62  Treatment: 4oz of juice and dinner  Symptoms: Pt stated that he felt shaky  Follow-up CBG: Time:1730 CBG Result:111  Possible Reasons for Event: patient did not eat very much lunch.

## 2019-12-07 NOTE — Progress Notes (Addendum)
      [address]       [place] [address]             [phone]        3 Days Post-Op Procedure(s) (LRB): STERNAL WOUND DEBRIDEMENT and WOUND VAC CHANGE (N/A) APPLICATION OF A-CELL OF CHEST (N/A)  Subjective: Patient without complaints this am.  Objective: Vital signs in last 24 hours: Temp:  [98.2 F (36.8 C)-98.5 F (36.9 C)] 98.2 F (36.8 C) (08/16 0601) Pulse Rate:  [58-70] 60 (08/16 0015) Cardiac Rhythm: Sinus bradycardia;Heart block (08/15 1900) Resp:  [15-17] 17 (08/16 0015) BP: (115-161)/(63-86) 145/68 (08/16 0601) SpO2:  [93 %-96 %] 96 % (08/16 0015)   Current Weight  12/06/19 76.6 kg       Intake/Output from previous day: 08/15 0701 - 08/16 0700 In: -  Out: 700 [Urine:675; Drains:25]   Physical Exam:  Cardiovascular: Slightly bradycardic this am Pulmonary: Clear to auscultation bilaterally Abdomen: Soft, non tender, bowel sounds present. Extremities: No lower extremity edema. Wounds: Sternal wound with VAC in place. Small amount of blood proximally  Lab Results: CBC:No results for input(s): WBC, HGB, HCT, PLT in the last 72 hours. BMET: No results for input(s): NA, K, CL, CO2, GLUCOSE, BUN, CREATININE, CALCIUM in the last 72 hours.  PT/INR:  Lab Results  Component Value Date   INR 1.3 (H) 11/26/2019   INR 1.5 (H) 10/29/2019   INR 1.2 10/29/2019   ABG:  INR: Will add last result for INR, ABG once components are confirmed Will add last 4 CBG results once components are confirmed  Assessment/Plan: 1. CV - SR, first degree heart block. SB with HR in the 50's this am.On Toprol XL 25 mg daily, Amiodarone 200 mg daily, Isordil 10 mg tid, Plavix 75 mg daily, ec asa 81 mg daily, Losartan 50 mg daily, Apixaban 5 mg bid.  2.  Pulmonary - On 2 liters of oxygen via [place] at night (was on prior) 3. ID-Klebsiella Oxytoca. On Cefipime for sternal wound. Per Dr. Orvan Seen, will change to oral Cipro at discharge.  4. Volume overload-on Lasix  40 mg daily. 5. DM-CBGs 99/123/108. On Metformin 1000 mg bid and Jardiance 25 mg daily. 6. Will discuss disposition with Dr. Orvan Seen as patient likely will have wound VAC changed today.  Donielle M ZimmermanPA-C 12/07/2019,7:09 AM I changed the vac dressing at the bedside; he continues to tolerate awake dressing changes relatively poorly. Therefore, he should remain in the hospital for ongoing wound care until the wound is more shallow and dressing changes are easier. Plan OR on 12/09/19.

## 2019-12-07 NOTE — Progress Notes (Signed)
Patient refused CPAP.

## 2019-12-07 NOTE — Op Note (Addendum)
Procedure(s): STERNAL WOUND DEBRIDEMENT and WOUND VAC CHANGE APPLICATION OF A-CELL OF CHEST Procedure Note  [name] male 59 y.o. 12/07/2019  Procedure(s) and Anesthesia Type:    * STERNAL WOUND DEBRIDEMENT and WOUND VAC CHANGE - Monitor Anesthesia Care    * APPLICATION OF A-CELL OF CHEST - Monitor Anesthesia Care  Surgeon(s) and Role:    * Wonda Olds, MD - Primary   Indications: The patient is admitted to the hospital with sternal infection s/p CABG. He is taken to the OR for sternal debridement and VAC change      Surgeon: Wonda Olds   Assistants: staff  Anesthesia: General endotracheal anesthesia  ASA Class: 3    Procedure Detail  STERNAL WOUND DEBRIDEMENT and WOUND VAC CHANGE, APPLICATION OF A-CELL OF CHEST After informed consent, he is taken to the OR on the above date. He is placed supine, and anesthesia is performed by MAC technique. The anterior chest is cleansed and draped sterilely. A preoperative pause is performed. The sternal defect is thoroughly inspected. Good granulation is demonstrated. Devitalized tissue is gently debrided.  A vac foam is cut to fit the defect and covered with VAC drape, concluding the procedure.   Estimated Blood Loss:  Minimal         Drains: none          Blood Given: none          Specimens: none         Implants: none        Complications:  * No complications entered in OR log *         Disposition: PACU - hemodynamically stable.         Condition: stable    ADDENDUM: the date of service should read 12/04/19

## 2019-12-08 LAB — GLUCOSE, CAPILLARY
Glucose-Capillary: 93 mg/dL (ref 70–99)
Glucose-Capillary: 147 mg/dL — ABNORMAL HIGH (ref 70–99)
Glucose-Capillary: 149 mg/dL — ABNORMAL HIGH (ref 70–99)
Glucose-Capillary: 97 mg/dL (ref 70–99)

## 2019-12-08 MED ORDER — LOSARTAN POTASSIUM 50 MG PO TABS
100.0000 mg | ORAL_TABLET | Freq: Every day | ORAL | Status: DC
  Administered 2019-12-08 – 2019-12-18 (×11): 100 mg via ORAL
  Filled 2019-12-08 (×11): qty 2

## 2019-12-08 MED ORDER — SODIUM CHLORIDE 0.9 % IV SOLN
2.0000 g | INTRAVENOUS | Status: DC
  Administered 2019-12-08 – 2019-12-17 (×10): 2 g via INTRAVENOUS
  Filled 2019-12-08 (×2): qty 2
  Filled 2019-12-08 (×2): qty 20
  Filled 2019-12-08: qty 2
  Filled 2019-12-08 (×2): qty 20
  Filled 2019-12-08 (×5): qty 2

## 2019-12-08 NOTE — Progress Notes (Addendum)
      [address]       [place] [address]             [phone]        4 Days Post-Op Procedure(s) (LRB): STERNAL WOUND DEBRIDEMENT and WOUND VAC CHANGE (N/A) APPLICATION OF A-CELL OF CHEST (N/A)  Subjective: Patient with pain from sternal wound VAC;just given pain medication, which is helping.  Objective: Vital signs in last 24 hours: Temp:  [97.5 F (36.4 C)-97.9 F (36.6 C)] 97.7 F (36.5 C) (08/17 0337) Pulse Rate:  [52-58] 57 (08/17 0337) Cardiac Rhythm: Heart block (08/16 1918) Resp:  [13-19] 18 (08/17 0337) BP: (134-168)/(62-72) 160/69 (08/17 0337) SpO2:  [94 %-99 %] 96 % (08/17 0337) Weight:  [76.8 kg] 76.8 kg (08/17 0337)   Current Weight  12/08/19 76.8 kg       Intake/Output from previous day: 08/16 0701 - 08/17 0700 In: 1069 [P.O.:240; IV Piggyback:829] Out: 3250 [Urine:3250]   Physical Exam:  Cardiovascular: Slightly bradycardic this am Pulmonary: Some crackles  Abdomen: Soft, non tender, bowel sounds present. Extremities: No lower extremity edema. Wounds: Sternal wound with VAC in place.  Lab Results: CBC:No results for input(s): WBC, HGB, HCT, PLT in the last 72 hours. BMET: No results for input(s): NA, K, CL, CO2, GLUCOSE, BUN, CREATININE, CALCIUM in the last 72 hours.  PT/INR:  Lab Results  Component Value Date   INR 1.3 (H) 11/26/2019   INR 1.5 (H) 10/29/2019   INR 1.2 10/29/2019   ABG:  INR: Will add last result for INR, ABG once components are confirmed Will add last 4 CBG results once components are confirmed  Assessment/Plan: 1. CV - SR, first degree heart block. SB with HR in the 50's this am and hypertensive. On Toprol XL 25 mg daily, Amiodarone 200 mg daily, Isordil 10 mg tid, Plavix 75 mg daily, ec asa 81 mg daily, Losartan 50 mg daily, Apixaban 5 mg bid.  Will increase Losartan for better BP control 2.  Pulmonary - On 2 liters of oxygen via [place] at night (was on prior) 3. ID-Klebsiella Oxytoca. On  Cefipime for sternal wound. Per Dr. Orvan Seen, will change to oral Cipro at discharge.  4. Volume overload-on Lasix 40 mg daily. 5. DM-CBGs 111/148/93. On Metformin 1000 mg bid and Jardiance 25 mg daily. 6. Return to OR on 12/09/2019   Keasha Malkiewicz M ZimmermanPA-C 12/08/2019,7:02 AM

## 2019-12-08 NOTE — Plan of Care (Signed)
°  Problem: Health Behavior/Discharge Planning: Goal: Ability to manage health-related needs will improve Outcome: Not Progressing   Problem: Clinical Measurements: Goal: Will remain free from infection Outcome: Not Progressing   

## 2019-12-09 ENCOUNTER — Inpatient Hospital Stay (HOSPITAL_COMMUNITY): Payer: Medicaid Other | Admitting: Certified Registered Nurse Anesthetist

## 2019-12-09 ENCOUNTER — Encounter (HOSPITAL_COMMUNITY)
Admission: AD | Disposition: A | Discharge status: Home Health | Payer: Self-pay | Source: Ambulatory Visit | Attending: Cardiothoracic Surgery

## 2019-12-09 ENCOUNTER — Encounter (HOSPITAL_COMMUNITY): Payer: Self-pay | Admitting: Cardiothoracic Surgery

## 2019-12-09 HISTORY — PX: APPLICATION OF A-CELL OF BACK: SHX6301

## 2019-12-09 HISTORY — PX: APPLICATION OF WOUND VAC: SHX5189

## 2019-12-09 HISTORY — PX: STERNAL WOUND DEBRIDEMENT: SHX1058

## 2019-12-09 LAB — BASIC METABOLIC PANEL
Anion gap: 10 (ref 5–15)
BUN: 25 mg/dL — ABNORMAL HIGH (ref 6–20)
CO2: 28 mmol/L (ref 22–32)
Calcium: 9.7 mg/dL (ref 8.9–10.3)
Chloride: 96 mmol/L — ABNORMAL LOW (ref 98–111)
Creatinine, Ser: 1.02 mg/dL (ref 0.61–1.24)
GFR calc Af Amer: >60 mL/min (ref >60)
GFR calc non Af Amer: >60 mL/min (ref >60)
Glucose, Bld: 96 mg/dL (ref 70–99)
Potassium: 4.8 mmol/L (ref 3.5–5.1)
Sodium: 134 mmol/L — ABNORMAL LOW (ref 135–145)

## 2019-12-09 LAB — SARS CORONAVIRUS 2 BY RT PCR (HOSPITAL ORDER, PERFORMED IN ~~LOC~~ HOSPITAL LAB): SARS Coronavirus 2: NEGATIVE

## 2019-12-09 LAB — GLUCOSE, CAPILLARY
Glucose-Capillary: 98 mg/dL (ref 70–99)
Glucose-Capillary: 101 mg/dL — ABNORMAL HIGH (ref 70–99)
Glucose-Capillary: 108 mg/dL — ABNORMAL HIGH (ref 70–99)
Glucose-Capillary: 99 mg/dL (ref 70–99)
Glucose-Capillary: 94 mg/dL (ref 70–99)
Glucose-Capillary: 262 mg/dL — ABNORMAL HIGH (ref 70–99)

## 2019-12-09 SURGERY — APPLICATION, WOUND VAC
Anesthesia: Monitor Anesthesia Care | Site: Chest

## 2019-12-09 MED ORDER — LACTATED RINGERS IV SOLN: INTRAVENOUS | Status: DC

## 2019-12-09 MED ORDER — ONDANSETRON HCL 4 MG/2ML IJ SOLN
INTRAMUSCULAR | Status: AC
  Filled 2019-12-09: qty 2

## 2019-12-09 MED ORDER — DEXAMETHASONE SODIUM PHOSPHATE 10 MG/ML IJ SOLN
INTRAMUSCULAR | Status: AC
  Filled 2019-12-09: qty 1

## 2019-12-09 MED ORDER — PROPOFOL 500 MG/50ML IV EMUL
INTRAVENOUS | Status: DC | PRN
  Administered 2019-12-09: 50 ug/kg/min via INTRAVENOUS

## 2019-12-09 MED ORDER — CHLORHEXIDINE GLUCONATE 0.12 % MT SOLN
15.0000 mL | OROMUCOSAL | Status: AC
  Administered 2019-12-09: 15 mL via OROMUCOSAL

## 2019-12-09 MED ORDER — MIDAZOLAM HCL 5 MG/5ML IJ SOLN
INTRAMUSCULAR | Status: DC | PRN
  Administered 2019-12-09: 2 mg via INTRAVENOUS

## 2019-12-09 MED ORDER — FENTANYL CITRATE (PF) 100 MCG/2ML IJ SOLN
INTRAMUSCULAR | Status: DC | PRN
  Administered 2019-12-09 (×2): 25 ug via INTRAVENOUS

## 2019-12-09 MED ORDER — MIDAZOLAM HCL 2 MG/2ML IJ SOLN
INTRAMUSCULAR | Status: AC
  Filled 2019-12-09: qty 2

## 2019-12-09 MED ORDER — PROPOFOL 1000 MG/100ML IV EMUL
INTRAVENOUS | Status: AC
  Filled 2019-12-09: qty 100

## 2019-12-09 MED ORDER — FENTANYL CITRATE (PF) 100 MCG/2ML IJ SOLN: 25.0000 ug | INTRAMUSCULAR | Status: DC | PRN

## 2019-12-09 MED ORDER — PROPOFOL 10 MG/ML IV BOLUS
INTRAVENOUS | Status: AC
  Filled 2019-12-09: qty 20

## 2019-12-09 MED ORDER — SODIUM CHLORIDE 0.9 % IR SOLN
Status: DC | PRN
  Administered 2019-12-09: 1000 mL

## 2019-12-09 MED ORDER — FENTANYL CITRATE (PF) 250 MCG/5ML IJ SOLN
INTRAMUSCULAR | Status: AC
  Filled 2019-12-09: qty 5

## 2019-12-09 MED ORDER — PHENYLEPHRINE 40 MCG/ML (10ML) SYRINGE FOR IV PUSH (FOR BLOOD PRESSURE SUPPORT)
PREFILLED_SYRINGE | INTRAVENOUS | Status: DC | PRN
  Administered 2019-12-09: 80 ug via INTRAVENOUS

## 2019-12-09 MED ORDER — ROCURONIUM BROMIDE 10 MG/ML (PF) SYRINGE
PREFILLED_SYRINGE | INTRAVENOUS | Status: AC
  Filled 2019-12-09: qty 10

## 2019-12-09 MED ORDER — PROMETHAZINE HCL 25 MG/ML IJ SOLN: 6.2500 mg | INTRAMUSCULAR | Status: DC | PRN

## 2019-12-09 MED ORDER — LIDOCAINE 2% (20 MG/ML) 5 ML SYRINGE
INTRAMUSCULAR | Status: AC
  Filled 2019-12-09: qty 5

## 2019-12-09 SURGICAL SUPPLY — 58 items
APL SKNCLS STERI-STRIP NONHPOA (GAUZE/BANDAGES/DRESSINGS) ×1
BAG DECANTER FOR FLEXI CONT (MISCELLANEOUS) ×1 IMPLANT
BANDAGE ESMARK 6X9 LF (GAUZE/BANDAGES/DRESSINGS) IMPLANT
BENZOIN TINCTURE PRP APPL 2/3 (GAUZE/BANDAGES/DRESSINGS) ×1 IMPLANT
BLADE CLIPPER SURG (BLADE) ×1 IMPLANT
BLADE SURG 10 STRL SS (BLADE) ×2 IMPLANT
BNDG CMPR 9X6 STRL LF SNTH (GAUZE/BANDAGES/DRESSINGS)
BNDG ESMARK 6X9 LF (GAUZE/BANDAGES/DRESSINGS)
BNDG GAUZE ELAST 4 BULKY (GAUZE/BANDAGES/DRESSINGS) IMPLANT
CANISTER SUCT 3000ML PPV (MISCELLANEOUS) ×2 IMPLANT
CANISTER WOUND CARE 500ML ATS (WOUND CARE) ×2 IMPLANT
CATH FOLEY 2WAY SLVR  5CC 16FR (CATHETERS)
CATH FOLEY 2WAY SLVR 5CC 16FR (CATHETERS) IMPLANT
CLIP VESOCCLUDE SM WIDE 24/CT (CLIP) IMPLANT
CNTNR URN SCR LID CUP LEK RST (MISCELLANEOUS) IMPLANT
CONT SPEC 4OZ STRL OR WHT (MISCELLANEOUS)
COVER SURGICAL LIGHT HANDLE (MISCELLANEOUS) ×4 IMPLANT
DRAPE INCISE IOBAN 66X45 STRL (DRAPES) IMPLANT
DRAPE LAPAROSCOPIC ABDOMINAL (DRAPES) ×2 IMPLANT
DRAPE WARM FLUID 44X44 (DRAPES) IMPLANT
DRSG AQUACEL AG ADV 3.5X14 (GAUZE/BANDAGES/DRESSINGS) ×1 IMPLANT
DRSG VAC ATS LRG SENSATRAC (GAUZE/BANDAGES/DRESSINGS) IMPLANT
DRSG VAC ATS MED SENSATRAC (GAUZE/BANDAGES/DRESSINGS) ×1 IMPLANT
DRSG VAC ATS SM SENSATRAC (GAUZE/BANDAGES/DRESSINGS) IMPLANT
ELECT REM PT RETURN 9FT ADLT (ELECTROSURGICAL) ×2
ELECTRODE REM PT RTRN 9FT ADLT (ELECTROSURGICAL) ×1 IMPLANT
GAUZE SPONGE 4X4 12PLY STRL (GAUZE/BANDAGES/DRESSINGS) ×2 IMPLANT
GAUZE XEROFORM 5X9 LF (GAUZE/BANDAGES/DRESSINGS) ×1 IMPLANT
GLOVE NEODERM STRL 7.5  LF PF (GLOVE) ×1
GLOVE NEODERM STRL 7.5 LF PF (GLOVE) ×1 IMPLANT
GLOVE SURG NEODERM 7.5  LF PF (GLOVE) ×1
GOWN STRL REUS W/ TWL LRG LVL3 (GOWN DISPOSABLE) ×3 IMPLANT
GOWN STRL REUS W/TWL LRG LVL3 (GOWN DISPOSABLE) ×4
HANDPIECE INTERPULSE COAX TIP (DISPOSABLE)
HEMOSTAT POWDER SURGIFOAM 1G (HEMOSTASIS) IMPLANT
HEMOSTAT SURGICEL 2X14 (HEMOSTASIS) IMPLANT
KIT BASIN OR (CUSTOM PROCEDURE TRAY) ×2 IMPLANT
KIT TURNOVER KIT B (KITS) ×2 IMPLANT
MATRIX WOUND 3-LAYER 10X15 (Tissue) ×1 IMPLANT
MICROMATRIX 1000MG (Tissue) ×2 IMPLANT
NS IRRIG 1000ML POUR BTL (IV SOLUTION) ×2 IMPLANT
PACK GENERAL/GYN (CUSTOM PROCEDURE TRAY) ×2 IMPLANT
PAD ARMBOARD 7.5X6 YLW CONV (MISCELLANEOUS) ×4 IMPLANT
SET HNDPC FAN SPRY TIP SCT (DISPOSABLE) IMPLANT
SOL PREP POV-IOD 4OZ 10% (MISCELLANEOUS) ×1 IMPLANT
SOLUTION PARTIC MCRMTRX 1000MG (Tissue) IMPLANT
SPONGE LAP 18X18 RF (DISPOSABLE) ×2 IMPLANT
STAPLER VISISTAT 35W (STAPLE) IMPLANT
SUT ETHILON 3 0 FSL (SUTURE) IMPLANT
SUT PDS AB 1 CTX 36 (SUTURE) IMPLANT
SUT PROLENE 2 0 MH 48 (SUTURE) IMPLANT
SUT VIC AB 2-0 CTX 27 (SUTURE) ×2 IMPLANT
SWAB COLLECTION DEVICE MRSA (MISCELLANEOUS) IMPLANT
SWAB CULTURE ESWAB REG 1ML (MISCELLANEOUS) IMPLANT
SYR 5ML LL (SYRINGE) IMPLANT
TOWEL GREEN STERILE (TOWEL DISPOSABLE) ×2 IMPLANT
TOWEL GREEN STERILE FF (TOWEL DISPOSABLE) ×2 IMPLANT
WATER STERILE IRR 1000ML POUR (IV SOLUTION) ×2 IMPLANT

## 2019-12-09 NOTE — Anesthesia Preprocedure Evaluation (Signed)
Anesthesia Evaluation  Patient identified by MRN, date of birth, ID band Patient awake    Reviewed: Allergy & Precautions, NPO status , Patient's Chart, lab work & pertinent test results  Airway Mallampati: II  TM Distance: >3 FB Neck ROM: Full    Dental no notable dental hx.    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   Neuro/Psych negative neurological ROS  negative psych ROS   GI/Hepatic negative GI ROS, Neg liver ROS,   Endo/Other  negative endocrine ROSdiabetes  Renal/GU negative Renal ROS  negative genitourinary   Musculoskeletal negative musculoskeletal ROS (+)   Abdominal   Peds negative pediatric ROS (+)  Hematology negative hematology ROS (+)   Anesthesia Other Findings   Reproductive/Obstetrics negative OB ROS                             Anesthesia Physical Anesthesia Plan  ASA: III  Anesthesia Plan: MAC   Post-op Pain Management:    Induction: Intravenous  PONV Risk Score and Plan:   Airway Management Planned: Simple Face Mask  Additional Equipment:   Intra-op Plan:   Post-operative Plan:   Informed Consent: I have reviewed the patients History and Physical, chart, labs and discussed the procedure including the risks, benefits and alternatives for the proposed anesthesia with the patient or authorized representative who has indicated his/her understanding and acceptance.     Dental advisory given  Plan Discussed with: CRNA and Surgeon  Anesthesia Plan Comments:         Anesthesia Quick Evaluation

## 2019-12-09 NOTE — Anesthesia Procedure Notes (Signed)
Procedure Name: [place] Performed by: Milford Cage, CRNA Pre-anesthesia Checklist: Patient identified, Emergency Drugs available, Suction available, Patient being monitored and Timeout performed Oxygen Delivery Method: Nasal cannula Ventilation: Oral airway inserted - appropriate to patient size

## 2019-12-09 NOTE — Plan of Care (Signed)
  Problem: Clinical Measurements: Goal: Will remain free from infection Outcome: Not Progressing   

## 2019-12-09 NOTE — Transfer of Care (Signed)
Immediate Anesthesia Transfer of Care Note  Patient: [name]  Procedure(s) Performed: WOUND VAC EXCHANGE (N/A Chest) STERNAL WOUND DEBRIDEMENT APPLICATION OF A-CELL OF BACK (N/A Chest)  Patient Location: PACU  Anesthesia Type:MAC  Level of Consciousness: drowsy  Airway & Oxygen Therapy: Patient Spontanous Breathing and Patient connected to nasal cannula oxygen  Post-op Assessment: Report given to RN and Post -op Vital signs reviewed and stable  Post vital signs: Reviewed and stable  Last Vitals:  Vitals Value Taken Time  BP    Temp    Pulse    Resp    SpO2      Last Pain:  Vitals:   12/09/19 1140  TempSrc:   PainSc: 5       Patients Stated Pain Goal: 2 (23/30/07 6226)  Complications: No complications documented.

## 2019-12-09 NOTE — Anesthesia Postprocedure Evaluation (Signed)
Anesthesia Post Note  Patient: [name]  Procedure(s) Performed: WOUND VAC EXCHANGE (N/A Chest) STERNAL WOUND DEBRIDEMENT (N/A Chest) APPLICATION OF A-CELL OF BACK (N/A Chest)     Patient location during evaluation: PACU Anesthesia Type: MAC Level of consciousness: awake and alert Pain management: pain level controlled Vital Signs Assessment: post-procedure vital signs reviewed and stable Respiratory status: spontaneous breathing, nonlabored ventilation, respiratory function stable and patient connected to nasal cannula oxygen Cardiovascular status: stable and blood pressure returned to baseline Postop Assessment: no apparent nausea or vomiting Anesthetic complications: no   No complications documented.  Last Vitals:  Vitals:   12/09/19 1615 12/09/19 1630  BP: (!) 117/57 129/66  Pulse: (!) 46 (!) 44  Resp: 12 14  Temp:    SpO2: 99% 98%    Last Pain:  Vitals:   12/09/19 1630  TempSrc:   PainSc: 0-No pain                 Aly Seidenberg S

## 2019-12-09 NOTE — Progress Notes (Signed)
Pt received from PACU. VSS. Telemetry applied. Incision clean, dry and intact. Wound vac with good seal at 125, scant drainage in canister. Call light in reach.  Clyde Canterbury, RN

## 2019-12-10 ENCOUNTER — Encounter (HOSPITAL_COMMUNITY): Payer: Self-pay | Admitting: Cardiothoracic Surgery

## 2019-12-10 LAB — GLUCOSE, CAPILLARY
Glucose-Capillary: 145 mg/dL — ABNORMAL HIGH (ref 70–99)
Glucose-Capillary: 183 mg/dL — ABNORMAL HIGH (ref 70–99)
Glucose-Capillary: 96 mg/dL (ref 70–99)
Glucose-Capillary: 111 mg/dL — ABNORMAL HIGH (ref 70–99)

## 2019-12-10 MED ORDER — ENSURE ENLIVE PO LIQD
237.0000 mL | Freq: Two times a day (BID) | ORAL | Status: DC
  Administered 2019-12-10 – 2019-12-18 (×8): 237 mL via ORAL

## 2019-12-10 MED ORDER — PROPOFOL 10 MG/ML IV BOLUS
INTRAVENOUS | Status: AC
  Filled 2019-12-10: qty 20

## 2019-12-10 NOTE — Progress Notes (Signed)
Initial Nutrition Assessment  DOCUMENTATION CODES:   Not applicable  INTERVENTION:    Ensure Enlive po BID, each supplement provides 350 kcal and 20 grams of protein  MVI daily   NUTRITION DIAGNOSIS:   Increased nutrient needs related to wound healing as evidenced by estimated needs.  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 Presents this admission with lower sternal drainage.   8/6- sternal wound debridement, wound VAC 8/10- sternal wound debridement, wound VAC change 8/16- sternal wound debridement, wound VAC change  Unable to obtain nutrition history from pt at this time. Per meal documentation intake shows to to fluctuate between 50-100%. RD to provide supplementation to maximize kcal and protein this admission.   Records indicate pt weighed 81.8 kg during last admission and 74.2 kg this admission. Suspect fluid component. Will attempt NFPE at later date of possible.   Medications: dulcolax, 40 mg lasix daily, SS novolog, levemir, jardiance, metformin, 30 mEq KCl daily  Labs: Na 134 (L) CBG 93-149  Diet Order:   Diet Order            Diet heart healthy/carb modified Room service appropriate? Yes; Fluid consistency: Thin  Diet effective now                 EDUCATION NEEDS:   Not appropriate for education at this time  Skin:  Skin Assessment: Skin Integrity Issues: Skin Integrity Issues:: Incisions, Wound VAC Wound Vac: chest Incisions: chest  Last BM:  8/18  Height:   Ht Readings from Last 1 Encounters:  12/09/19 5\' 7"  (1.702 m)    Weight:   Wt Readings from Last 1 Encounters:  12/10/19 74.2 kg    BMI:  Body mass index is 25.61 kg/m.  Estimated Nutritional Needs:   Kcal:  2200-2400 kcal  Protein:   115-130 grams  Fluid:  >/= 2.2 L/day   Mariana Single RD, LDN Clinical Nutrition Pager listed in [facility]

## 2019-12-10 NOTE — Discharge Instructions (Addendum)
Wound VAC to be changed Monday 12/21/2019 at Dr. Orvan Seen office    Information on my medicine - ELIQUIS (apixaban)  Why was Eliquis prescribed for you? Eliquis was prescribed for you to reduce the risk of a blood clot forming that can cause a stroke if you have a medical condition called atrial fibrillation (a type of irregular heartbeat).  What do You need to know about Eliquis ? Take your Eliquis TWICE DAILY - one tablet in the morning and one tablet in the evening with or without food. If you have difficulty swallowing the tablet whole please discuss with your pharmacist how to take the medication safely.  Take Eliquis exactly as prescribed by your doctor and DO NOT stop taking Eliquis without talking to the doctor who prescribed the medication.  Stopping may increase your risk of developing a stroke.  Refill your prescription before you run out.  After discharge, you should have regular check-up appointments with your healthcare provider that is prescribing your Eliquis.  In the future your dose may need to be changed if your kidney function or weight changes by a significant amount or as you get older.  What do you do if you miss a dose? If you miss a dose, take it as soon as you remember on the same day and resume taking twice daily.  Do not take more than one dose of ELIQUIS at the same time to make up a missed dose.  Important Safety Information A possible side effect of Eliquis is bleeding. You should call your healthcare provider right away if you experience any of the following: ? Bleeding from an injury or your nose that does not stop. ? Unusual colored urine (red or dark brown) or unusual colored stools (red or black). ? Unusual bruising for unknown reasons. ? A serious fall or if you hit your head (even if there is no bleeding).  Some medicines may interact with Eliquis and might increase your risk of bleeding or clotting while on Eliquis. To help avoid this, consult  your healthcare provider or pharmacist prior to using any new prescription or non-prescription medications, including herbals, vitamins, non-steroidal anti-inflammatory drugs (NSAIDs) and supplements.  This website has more information on Eliquis (apixaban): http://www.eliquis.com/eliquis/home

## 2019-12-10 NOTE — Progress Notes (Addendum)
      [address]       [place] [address]             [phone]        1 Day Post-Op Procedure(s) (LRB): WOUND VAC EXCHANGE (N/A) STERNAL WOUND DEBRIDEMENT (N/A) APPLICATION OF A-CELL OF BACK (N/A)  Subjective: Patient with sternal, wound VAC pain this am.  Objective: Vital signs in last 24 hours: Temp:  [97.6 F (36.4 C)-98.1 F (36.7 C)] 98.1 F (36.7 C) (08/19 0501) Pulse Rate:  [44-60] 60 (08/19 0501) Cardiac Rhythm: Heart block (08/18 1900) Resp:  [10-18] 18 (08/19 0501) BP: (106-154)/(57-77) 154/77 (08/19 0501) SpO2:  [96 %-99 %] 98 % (08/19 0501) Weight:  [74.2 kg-77.2 kg] 74.2 kg (08/19 0500)   Current Weight  12/10/19 74.2 kg       Intake/Output from previous day: 08/18 0701 - 08/19 0700 In: 463.1 [P.O.:60; I.V.:400; IV Piggyback:3.1] Out: 1650 [Urine:1600; Drains:50]   Physical Exam:  Cardiovascular: Slightly bradycardic this am Pulmonary: Clear to auscultation bilaterally Abdomen: Soft, non tender, bowel sounds present. Extremities: No lower extremity edema. Wounds: Sternal wound with VAC in place with bloody like drainage in cannister  Lab Results: CBC:No results for input(s): WBC, HGB, HCT, PLT in the last 72 hours. BMET:  Recent Labs    12/09/19 0608  NA 134*  K 4.8  CL 96*  CO2 28  GLUCOSE 96  BUN 25*  CREATININE 1.02  CALCIUM 9.7    PT/INR:  Lab Results  Component Value Date   INR 1.3 (H) 11/26/2019   INR 1.5 (H) 10/29/2019   INR 1.2 10/29/2019   ABG:  INR: Will add last result for INR, ABG once components are confirmed Will add last 4 CBG results once components are confirmed  Assessment/Plan: 1. CV - SR, first degree heart block. SB with HR in the 50's this am and hypertensive. On Toprol XL 25 mg daily, Amiodarone 200 mg daily, Isordil 10 mg tid, Plavix 75 mg daily, ec asa 81 mg daily, Losartan 100 mg daily, Apixaban 5 mg bid.   2.  Pulmonary - On 2 liters of oxygen via  at night (was on  prior) 3. ID-Klebsiella Oxytoca. On Cefipime for sternal wound. Per Dr. Orvan Seen, will change to oral Cipro at discharge.  4. Volume overload-on Lasix 40 mg daily. 5. DM-CBGs 99/94/262. On Metformin 1000 mg bid and Jardiance 25 mg daily.   Sharalyn Ink ZimmermanPA-C 12/10/2019,7:04 AM Patient is seen and examined and chart is reviewed.  Agree with documentation.  Plan VAC change at the bedside tomorrow. Cora Brierley Z. Orvan Seen, [place]

## 2019-12-10 NOTE — Plan of Care (Signed)
?  Problem: Clinical Measurements: ?Goal: Will remain free from infection ?Outcome: Progressing ?  ?

## 2019-12-10 NOTE — Progress Notes (Signed)
Patient with complaints of being tired today, has been sleeping on and off this morning and early afternoon.  easily falls back asleep when speaking.   Patient does wake up easily and is alert and oriented. vital signs obtained. Recent BP 111/61 heart rate 54-55 on monitor and oxygen is 97% on 2L [place],  Wound vac dressing to Sternum in place. Will monitor patient. Mary-Ann Pennella, Bettina Gavia rN

## 2019-12-10 NOTE — Plan of Care (Signed)
  Problem: Education: Goal: Knowledge of General Education information will improve Description Including pain rating scale, medication(s)/side effects and non-pharmacologic comfort measures Outcome: Progressing   Problem: Health Behavior/Discharge Planning: Goal: Ability to manage health-related needs will improve Outcome: Progressing   

## 2019-12-10 NOTE — Op Note (Signed)
Procedure(s): WOUND VAC EXCHANGE STERNAL WOUND DEBRIDEMENT APPLICATION OF A-CELL OF BACK Procedure Note  [name] male 59 y.o. 12/10/2019  Procedure(s) and Anesthesia Type:    * WOUND VAC EXCHANGE - Monitor Anesthesia Care    * STERNAL WOUND DEBRIDEMENT - Monitor Anesthesia Care    * APPLICATION OF A-CELL OF BACK - Monitor Anesthesia Care  Surgeon(s) and Role:    * Wonda Olds, MD - Primary   Indications: The patient has been in the hospital undergoing advanced wound care treatment for a superficial sternal wound infection.  He is to taken to the operating room for wound inspection and VAC change.     Surgeon: Wonda Olds   Assistants: Staff  Anesthesia: IV sedation  ASA Class: 2    Procedure Detail  WOUND VAC EXCHANGE, STERNAL WOUND DEBRIDEMENT, APPLICATION OF A-CELL OF BACK After informed consent, the patient is taken to the operating room on the above listed date.  He is placed in the supine position on the operating table.  Anesthesia was confirmed to be adequate using IV sedation.  The anterior chest was cleansed and draped sterilely with Betadine solution.  A preop surgical pause was performed.  The existing VAC drape was removed.  The wound was copiously irrigated.  There was good granulation tissue seen throughout and minimal degree of manual debridement was required.  A wound VAC sponge was cut to fit the defect and this was then covered with a VAC drape.  This concluded the procedure all sponge instrument and needle counts were correct and I was present throughout. Estimated Blood Loss:  Minimal         Drains: None   Blood Given: none          Specimens: None         Implants: none        Complications:  * No complications entered in OR log *         Disposition: PACU - hemodynamically stable.         Condition: stable

## 2019-12-10 NOTE — Progress Notes (Addendum)
Patient wound vac with leak, at this time sponge appears with suction,  unsuccessful in gaining good seal.  in order to obtain good seal again. Canister changed, still with leak.  Jadene Pierini [facility] made aware. Stated if possibly going back to surgery in AM, OK to turn wound vac off if unable to get wound vac to work properly. Will continue to monitor patient. Nikeshia Keetch, Bettina Gavia RN   1900 wound vac clamped and turned off. Report given to night shift RN  Medical laboratory scientific officer, Bettina Gavia RN

## 2019-12-11 LAB — GLUCOSE, CAPILLARY
Glucose-Capillary: 211 mg/dL — ABNORMAL HIGH (ref 70–99)
Glucose-Capillary: 211 mg/dL — ABNORMAL HIGH (ref 70–99)
Glucose-Capillary: 159 mg/dL — ABNORMAL HIGH (ref 70–99)
Glucose-Capillary: 172 mg/dL — ABNORMAL HIGH (ref 70–99)
Glucose-Capillary: 89 mg/dL (ref 70–99)

## 2019-12-11 MED ORDER — AMLODIPINE BESYLATE 5 MG PO TABS
5.0000 mg | ORAL_TABLET | Freq: Every day | ORAL | Status: DC
  Administered 2019-12-11 – 2019-12-17 (×7): 5 mg via ORAL
  Filled 2019-12-11 (×7): qty 1

## 2019-12-11 NOTE — Plan of Care (Signed)
  Problem: Education: Goal: Knowledge of General Education information will improve Description Including pain rating scale, medication(s)/side effects and non-pharmacologic comfort measures Outcome: Progressing   Problem: Health Behavior/Discharge Planning: Goal: Ability to manage health-related needs will improve Outcome: Progressing   

## 2019-12-11 NOTE — Progress Notes (Addendum)
      [address]       [place] [address]             [phone]        2 Days Post-Op Procedure(s) (LRB): WOUND VAC EXCHANGE (N/A) STERNAL WOUND DEBRIDEMENT (N/A) APPLICATION OF A-CELL OF BACK (N/A)  Subjective: Patient's wound VAC clamped and turned off yesterday as seal broken. Patient with incisional, sternal pain this am  Objective: Vital signs in last 24 hours: Temp:  [97.7 F (36.5 C)-98.5 F (36.9 C)] 98.3 F (36.8 C) (08/20 0454) Pulse Rate:  [53-82] 65 (08/20 0454) Cardiac Rhythm: Normal sinus rhythm;Heart block (08/19 2000) Resp:  [13-20] 18 (08/20 0454) BP: (103-163)/(61-84) 163/69 (08/20 0454) SpO2:  [93 %-97 %] 94 % (08/20 0454)   Current Weight  12/10/19 74.2 kg       Intake/Output from previous day: 08/19 0701 - 08/20 0700 In: 301.8 [P.O.:240; IV Piggyback:61.8] Out: 5929 [Urine:1300; Drains:75]   Physical Exam:  Cardiovascular: Slightly bradycardic this am Pulmonary: Clear to auscultation bilaterally Abdomen: Soft, non tender, bowel sounds present. Extremities: No lower extremity edema. Wounds: Sternal wound with VAC in place and is turned off secondary to loss of suction last evening.  Lab Results: CBC:No results for input(s): WBC, HGB, HCT, PLT in the last 72 hours. BMET:  Recent Labs    12/09/19 0608  NA 134*  K 4.8  CL 96*  CO2 28  GLUCOSE 96  BUN 25*  CREATININE 1.02  CALCIUM 9.7    PT/INR:  Lab Results  Component Value Date   INR 1.3 (H) 11/26/2019   INR 1.5 (H) 10/29/2019   INR 1.2 10/29/2019   ABG:  INR: Will add last result for INR, ABG once components are confirmed Will add last 4 CBG results once components are confirmed  Assessment/Plan: 1. CV - SB,SRR, first degree heart block. On Toprol XL 25 mg daily, Isordil 10 mg tid, Plavix 75 mg daily, ec asa 81 mg daily, Losartan 100 mg daily, Apixaban 5 mg bid. Will start low dose Amlodipine as still hypertensive 2.  Pulmonary - On 2 liters of  oxygen via [place] at night (was on prior) 3. ID-Klebsiella Oxytoca. On Cefipime for sternal wound. Per Dr. Orvan Seen, will change to oral Cipro at discharge.  4. Volume overload-on Lasix 40 mg daily. 5. DM-CBGs 96/111/211. On Metformin 1000 mg bid and Jardiance 25 mg daily. 6. Wound VAC to be changed today;bedside vs OR  Donielle M ZimmermanPA-C 12/11/2019,7:02 AM  Wound vac dressing changed at bedside; tolerated well. Plan OR early next week. Hiral Lukasiewicz Z. Orvan Seen, [place]

## 2019-12-12 LAB — GLUCOSE, CAPILLARY
Glucose-Capillary: 101 mg/dL — ABNORMAL HIGH (ref 70–99)
Glucose-Capillary: 102 mg/dL — ABNORMAL HIGH (ref 70–99)
Glucose-Capillary: 189 mg/dL — ABNORMAL HIGH (ref 70–99)
Glucose-Capillary: 134 mg/dL — ABNORMAL HIGH (ref 70–99)

## 2019-12-12 NOTE — Progress Notes (Addendum)
      [address]       [place] [address]             [phone]      3 Days Post-Op Procedure(s) (LRB): WOUND VAC EXCHANGE (N/A) STERNAL WOUND DEBRIDEMENT (N/A) APPLICATION OF A-CELL OF BACK (N/A)   Subjective:  Patient complains of pain.  Vac changed at bedside yesterday, functioning well  Objective: Vital signs in last 24 hours: Temp:  [98 F (36.7 C)-98.6 F (37 C)] 98.3 F (36.8 C) (08/21 0330) Pulse Rate:  [50-71] 61 (08/21 0738) Cardiac Rhythm: Heart block (08/21 0755) Resp:  [8-30] 14 (08/21 0738) BP: (119-162)/(64-69) 162/69 (08/21 0738) SpO2:  [84 %-100 %] 92 % (08/21 0738) Weight:  [76.7 kg] 76.7 kg (08/21 0450)  Intake/Output from previous day: 08/20 0701 - 08/21 0700 In: -  Out: 450 [Urine:450]  General appearance: alert, cooperative and no distress Heart: regular rate and rhythm Lungs: clear to auscultation bilaterally Wound: wound vac in place  Lab Results: No results for input(s): WBC, HGB, HCT, PLT in the last 72 hours. BMET: No results for input(s): NA, K, CL, CO2, GLUCOSE, BUN, CREATININE, CALCIUM in the last 72 hours.  PT/INR: No results for input(s): LABPROT, INR in the last 72 hours. ABG    Component Value Date/Time   PHART 7.271 (L) 10/30/2019 0506   HCO3 22.2 10/30/2019 0506   TCO2 24 10/30/2019 0506   ACIDBASEDEF 5.0 (H) 10/30/2019 0506   O2SAT 58.3 11/03/2019 0314   CBG (last 3)  Recent Labs    12/11/19 1756 12/11/19 2126 12/12/19 0424  GLUCAP 172* 89 101*    Assessment/Plan: S/P Procedure(s) (LRB): WOUND VAC EXCHANGE (N/A) STERNAL WOUND DEBRIDEMENT (N/A) APPLICATION OF A-CELL OF BACK (N/A)  1. CV- Sinus with 1st degree heart block- continue Toprol, Isordil, Plavix, ASA, Cozaar and Eliquis, also started on Amlodipine for additional BP coverage which remains elevated, some of this is likely pain related 2. Pulm- no acute issues, continue IS 3. ID_ Klebsiella Oxytoca-- on ABX, wound did not appear  infected yesterday, vac changed at bedside continue current care 4. DM- sugars controlled 6. Dispo- patient stable, vac changed at bedside yesterday, wound progressing, watch BP, continue ABX  LOS: 16 days    Ellwood Handler, PA-C' 12/12/2019  Chart reviewed, patient examined, agree with above. Will need VAC change early next week.

## 2019-12-13 LAB — GLUCOSE, CAPILLARY
Glucose-Capillary: 80 mg/dL (ref 70–99)
Glucose-Capillary: 97 mg/dL (ref 70–99)
Glucose-Capillary: 108 mg/dL — ABNORMAL HIGH (ref 70–99)
Glucose-Capillary: 145 mg/dL — ABNORMAL HIGH (ref 70–99)
Glucose-Capillary: 105 mg/dL — ABNORMAL HIGH (ref 70–99)

## 2019-12-13 NOTE — Progress Notes (Signed)
During bedside shift report. Would VAC seal in tact, continuous at 159mmhg. No complications at this time. Will monitor. Kameko Hukill, Bettina Gavia RN

## 2019-12-13 NOTE — Progress Notes (Addendum)
Wound vac machine is stating a blockage alert. Unsuccessful with unblocking and obtaining continuous suction at 125 mmhg. Dr. Cyndia Bent paged and made aware and stated OK to turn off wound vac at this time. Report given to night shift RN. Muna Demers, Bettina Gavia RN

## 2019-12-13 NOTE — Progress Notes (Addendum)
On wound vac machine seal still in "green" continuous at 123mmhg. Will continue to monitor. Jlon Betker, Bettina Gavia RN

## 2019-12-13 NOTE — Progress Notes (Addendum)
      [address]       [place] [address]             [phone]      4 Days Post-Op Procedure(s) (LRB): WOUND VAC EXCHANGE (N/A) STERNAL WOUND DEBRIDEMENT (N/A) APPLICATION OF A-CELL OF BACK (N/A)   Subjective:  No new complaints.  Continues to have pain.  Asks when surgery will be this week.  Objective: Vital signs in last 24 hours: Temp:  [98 F (36.7 C)-98.6 F (37 C)] 98.2 F (36.8 C) (08/22 0413) Pulse Rate:  [55-57] 55 (08/22 0413) Cardiac Rhythm: Heart block (08/22 0739) Resp:  [15-18] 15 (08/22 0607) BP: (126-148)/(64-77) 146/71 (08/22 0413) SpO2:  [95 %-98 %] 97 % (08/22 0413) Weight:  [78.1 kg] 78.1 kg (08/22 0607)  Intake/Output from previous day: 08/21 0701 - 08/22 0700 In: 360 [P.O.:360] Out: 1350 [Urine:1350]  General appearance: alert, cooperative and no distress Heart: regular rate and rhythm Lungs: clear to auscultation bilaterally Abdomen: soft, non-tender; bowel sounds normal; no masses,  no organomegaly Extremities: extremities normal, atraumatic, no cyanosis or edema Wound: wound vac in place  Lab Results: No results for input(s): WBC, HGB, HCT, PLT in the last 72 hours. BMET: No results for input(s): NA, K, CL, CO2, GLUCOSE, BUN, CREATININE, CALCIUM in the last 72 hours.  PT/INR: No results for input(s): LABPROT, INR in the last 72 hours. ABG    Component Value Date/Time   PHART 7.271 (L) 10/30/2019 0506   HCO3 22.2 10/30/2019 0506   TCO2 24 10/30/2019 0506   ACIDBASEDEF 5.0 (H) 10/30/2019 0506   O2SAT 58.3 11/03/2019 0314   CBG (last 3)  Recent Labs    12/12/19 2059 12/13/19 0412 12/13/19 0608  GLUCAP 134* 80 97    Assessment/Plan: S/P Procedure(s) (LRB): WOUND VAC EXCHANGE (N/A) STERNAL WOUND DEBRIDEMENT (N/A) APPLICATION OF A-CELL OF BACK (N/A)  1. CV- NSR with 1st degree Block, BP in the 140s this morning continue- Toprol, Isordil, Cozaar, Amlodipine.[name] Kitchen on ASA, Plavx, and Eliquis 2. Pulm- no acute  issues 3. ID- remains afebrile, Vac changed on Friday, continue ABX 4. DM- sugars are controlled 5. Dispo- patient stable, continue current care, for possible OR early next week   LOS: 17 days    Ellwood Handler, PA-C 12/13/2019    Chart reviewed, patient examined, agree with above. VAC working appropriately after dressing reinforced.

## 2019-12-13 NOTE — Progress Notes (Addendum)
Patient with leak in wound vac this am, Machine continues to state needing seal check/ leak. Canister changed and dressing reinforced. Unsuccessful for obtaining full seal per would vac machine.  Niyah Mamaril, Bettina Gavia RN    [facility] made aware and will be up to see patient. Puneet Selden, Bettina Gavia RN

## 2019-12-14 ENCOUNTER — Encounter (HOSPITAL_COMMUNITY): Payer: Self-pay | Admitting: Cardiothoracic Surgery

## 2019-12-14 ENCOUNTER — Inpatient Hospital Stay (HOSPITAL_COMMUNITY): Payer: Medicaid Other | Admitting: Anesthesiology

## 2019-12-14 ENCOUNTER — Encounter (HOSPITAL_COMMUNITY)
Admission: AD | Disposition: A | Discharge status: Home Health | Payer: Self-pay | Source: Ambulatory Visit | Attending: Cardiothoracic Surgery

## 2019-12-14 DIAGNOSIS — I9789 Other postprocedural complications and disorders of the circulatory system, not elsewhere classified: Secondary | ICD-10-CM

## 2019-12-14 HISTORY — PX: APPLICATION OF WOUND VAC: SHX5189

## 2019-12-14 LAB — GLUCOSE, CAPILLARY
Glucose-Capillary: 107 mg/dL — ABNORMAL HIGH (ref 70–99)
Glucose-Capillary: 137 mg/dL — ABNORMAL HIGH (ref 70–99)
Glucose-Capillary: 79 mg/dL (ref 70–99)
Glucose-Capillary: 57 mg/dL — ABNORMAL LOW (ref 70–99)
Glucose-Capillary: 81 mg/dL (ref 70–99)
Glucose-Capillary: 87 mg/dL (ref 70–99)
Glucose-Capillary: 113 mg/dL — ABNORMAL HIGH (ref 70–99)

## 2019-12-14 SURGERY — APPLICATION, WOUND VAC
Anesthesia: Monitor Anesthesia Care | Site: Chest

## 2019-12-14 MED ORDER — ACETAMINOPHEN 10 MG/ML IV SOLN: 1000.0000 mg | Freq: Once | INTRAVENOUS | Status: DC | PRN

## 2019-12-14 MED ORDER — DEXTROSE 50 % IV SOLN
INTRAVENOUS | Status: AC
  Filled 2019-12-14: qty 50

## 2019-12-14 MED ORDER — MIDAZOLAM HCL 2 MG/2ML IJ SOLN
INTRAMUSCULAR | Status: AC
  Filled 2019-12-14: qty 2

## 2019-12-14 MED ORDER — PHENYLEPHRINE HCL-NACL 10-0.9 MG/250ML-% IV SOLN
INTRAVENOUS | Status: DC | PRN
  Administered 2019-12-14: 50 ug/min via INTRAVENOUS

## 2019-12-14 MED ORDER — EPHEDRINE SULFATE-NACL 50-0.9 MG/10ML-% IV SOSY
PREFILLED_SYRINGE | INTRAVENOUS | Status: DC | PRN
  Administered 2019-12-14: 10 mg via INTRAVENOUS

## 2019-12-14 MED ORDER — OXYCODONE HCL 5 MG/5ML PO SOLN: 5.0000 mg | Freq: Once | ORAL | Status: DC | PRN

## 2019-12-14 MED ORDER — PROPOFOL 10 MG/ML IV BOLUS
INTRAVENOUS | Status: DC | PRN
  Administered 2019-12-14: 10 mg via INTRAVENOUS

## 2019-12-14 MED ORDER — DEXTROSE 50 % IV SOLN
25.0000 mL | Freq: Once | INTRAVENOUS | Status: AC
  Administered 2019-12-14: 25 mL via INTRAVENOUS

## 2019-12-14 MED ORDER — ACETAMINOPHEN 160 MG/5ML PO SOLN: 1000.0000 mg | Freq: Once | ORAL | Status: DC | PRN

## 2019-12-14 MED ORDER — OXYCODONE HCL 5 MG PO TABS: 5.0000 mg | ORAL_TABLET | Freq: Once | ORAL | Status: DC | PRN

## 2019-12-14 MED ORDER — ONDANSETRON HCL 4 MG/2ML IJ SOLN
INTRAMUSCULAR | Status: DC | PRN
  Administered 2019-12-14: 4 mg via INTRAVENOUS

## 2019-12-14 MED ORDER — STERILE WATER FOR IRRIGATION IR SOLN
Status: DC | PRN
  Administered 2019-12-14: 1000 mL

## 2019-12-14 MED ORDER — LACTATED RINGERS IV SOLN: INTRAVENOUS | Status: DC

## 2019-12-14 MED ORDER — CHLORHEXIDINE GLUCONATE 0.12 % MT SOLN
15.0000 mL | Freq: Once | OROMUCOSAL | Status: DC
  Filled 2019-12-14: qty 15

## 2019-12-14 MED ORDER — FENTANYL CITRATE (PF) 100 MCG/2ML IJ SOLN
INTRAMUSCULAR | Status: AC
  Filled 2019-12-14: qty 2

## 2019-12-14 MED ORDER — FENTANYL CITRATE (PF) 250 MCG/5ML IJ SOLN
INTRAMUSCULAR | Status: AC
  Filled 2019-12-14: qty 5

## 2019-12-14 MED ORDER — 0.9 % SODIUM CHLORIDE (POUR BTL) OPTIME
TOPICAL | Status: DC | PRN
  Administered 2019-12-14: 1000 mL

## 2019-12-14 MED ORDER — FENTANYL CITRATE (PF) 100 MCG/2ML IJ SOLN
25.0000 ug | INTRAMUSCULAR | Status: DC | PRN
  Administered 2019-12-14: 50 ug via INTRAVENOUS

## 2019-12-14 MED ORDER — FENTANYL CITRATE (PF) 100 MCG/2ML IJ SOLN: 25.0000 ug | INTRAMUSCULAR | Status: DC | PRN

## 2019-12-14 MED ORDER — MIDAZOLAM HCL 5 MG/5ML IJ SOLN
INTRAMUSCULAR | Status: DC | PRN
  Administered 2019-12-14 (×2): 1 mg via INTRAVENOUS

## 2019-12-14 MED ORDER — PROPOFOL 500 MG/50ML IV EMUL
INTRAVENOUS | Status: DC | PRN
  Administered 2019-12-14: 75 ug/kg/min via INTRAVENOUS

## 2019-12-14 MED ORDER — FENTANYL CITRATE (PF) 100 MCG/2ML IJ SOLN
INTRAMUSCULAR | Status: DC | PRN
  Administered 2019-12-14 (×2): 25 ug via INTRAVENOUS

## 2019-12-14 MED ORDER — ACETAMINOPHEN 500 MG PO TABS: 1000.0000 mg | ORAL_TABLET | Freq: Once | ORAL | Status: DC | PRN

## 2019-12-14 SURGICAL SUPPLY — 56 items
APL SKNCLS STERI-STRIP NONHPOA (GAUZE/BANDAGES/DRESSINGS)
BAG DECANTER FOR FLEXI CONT (MISCELLANEOUS) ×2 IMPLANT
BANDAGE ESMARK 6X9 LF (GAUZE/BANDAGES/DRESSINGS) IMPLANT
BENZOIN TINCTURE PRP APPL 2/3 (GAUZE/BANDAGES/DRESSINGS) IMPLANT
BLADE CLIPPER SURG (BLADE) ×2 IMPLANT
BLADE SURG 10 STRL SS (BLADE) ×2 IMPLANT
BNDG CMPR 9X6 STRL LF SNTH (GAUZE/BANDAGES/DRESSINGS)
BNDG ESMARK 6X9 LF (GAUZE/BANDAGES/DRESSINGS)
BNDG GAUZE ELAST 4 BULKY (GAUZE/BANDAGES/DRESSINGS) IMPLANT
CANISTER SUCT 3000ML PPV (MISCELLANEOUS) ×2 IMPLANT
CANISTER WOUND CARE 500ML ATS (WOUND CARE) ×2 IMPLANT
CANISTER WOUNDNEG PRESSURE 500 (CANNISTER) ×1 IMPLANT
CATH FOLEY 2WAY SLVR  5CC 16FR (CATHETERS)
CATH FOLEY 2WAY SLVR 5CC 16FR (CATHETERS) IMPLANT
CLIP VESOCCLUDE SM WIDE 24/CT (CLIP) IMPLANT
CNTNR URN SCR LID CUP LEK RST (MISCELLANEOUS) IMPLANT
CONT SPEC 4OZ STRL OR WHT (MISCELLANEOUS)
COVER SURGICAL LIGHT HANDLE (MISCELLANEOUS) ×4 IMPLANT
DRAPE INCISE IOBAN 66X45 STRL (DRAPES) IMPLANT
DRAPE LAPAROSCOPIC ABDOMINAL (DRAPES) ×2 IMPLANT
DRAPE WARM FLUID 44X44 (DRAPES) IMPLANT
DRSG AQUACEL AG ADV 3.5X14 (GAUZE/BANDAGES/DRESSINGS) ×2 IMPLANT
DRSG VAC ATS LRG SENSATRAC (GAUZE/BANDAGES/DRESSINGS) IMPLANT
DRSG VAC ATS MED SENSATRAC (GAUZE/BANDAGES/DRESSINGS) ×1 IMPLANT
DRSG VAC ATS SM SENSATRAC (GAUZE/BANDAGES/DRESSINGS) IMPLANT
ELECT REM PT RETURN 9FT ADLT (ELECTROSURGICAL) ×2
ELECTRODE REM PT RTRN 9FT ADLT (ELECTROSURGICAL) ×1 IMPLANT
GAUZE SPONGE 4X4 12PLY STRL (GAUZE/BANDAGES/DRESSINGS) ×2 IMPLANT
GAUZE XEROFORM 5X9 LF (GAUZE/BANDAGES/DRESSINGS) IMPLANT
GLOVE NEODERM STRL 7.5  LF PF (GLOVE) ×1
GLOVE NEODERM STRL 7.5 LF PF (GLOVE) ×1 IMPLANT
GLOVE SURG NEODERM 7.5  LF PF (GLOVE) ×1
GOWN STRL REUS W/ TWL LRG LVL3 (GOWN DISPOSABLE) ×3 IMPLANT
GOWN STRL REUS W/TWL LRG LVL3 (GOWN DISPOSABLE) ×6
HANDPIECE INTERPULSE COAX TIP (DISPOSABLE)
HEMOSTAT POWDER SURGIFOAM 1G (HEMOSTASIS) IMPLANT
HEMOSTAT SURGICEL 2X14 (HEMOSTASIS) IMPLANT
KIT BASIN OR (CUSTOM PROCEDURE TRAY) ×2 IMPLANT
KIT TURNOVER KIT B (KITS) ×2 IMPLANT
NS IRRIG 1000ML POUR BTL (IV SOLUTION) ×2 IMPLANT
PACK GENERAL/GYN (CUSTOM PROCEDURE TRAY) ×2 IMPLANT
PAD ARMBOARD 7.5X6 YLW CONV (MISCELLANEOUS) ×4 IMPLANT
SET HNDPC FAN SPRY TIP SCT (DISPOSABLE) IMPLANT
SOL PREP POV-IOD 4OZ 10% (MISCELLANEOUS) IMPLANT
SPONGE LAP 18X18 RF (DISPOSABLE) ×2 IMPLANT
STAPLER VISISTAT 35W (STAPLE) IMPLANT
SUT ETHILON 3 0 FSL (SUTURE) IMPLANT
SUT PDS AB 1 CTX 36 (SUTURE) IMPLANT
SUT PROLENE 2 0 MH 48 (SUTURE) IMPLANT
SUT VIC AB 2-0 CTX 27 (SUTURE) ×4 IMPLANT
SWAB COLLECTION DEVICE MRSA (MISCELLANEOUS) IMPLANT
SWAB CULTURE ESWAB REG 1ML (MISCELLANEOUS) IMPLANT
SYR 5ML LL (SYRINGE) IMPLANT
TOWEL GREEN STERILE (TOWEL DISPOSABLE) ×2 IMPLANT
TOWEL GREEN STERILE FF (TOWEL DISPOSABLE) ×2 IMPLANT
WATER STERILE IRR 1000ML POUR (IV SOLUTION) ×2 IMPLANT

## 2019-12-14 NOTE — Progress Notes (Addendum)
      [address]       [place] [address]             [phone]      5 Days Post-Op Procedure(s) (LRB): WOUND VAC EXCHANGE (N/A) STERNAL WOUND DEBRIDEMENT (N/A) APPLICATION OF A-CELL OF BACK (N/A)   Subjective:  No new complaints.  Continues to have pain along sternum  Objective: Vital signs in last 24 hours: Temp:  [97.6 F (36.4 C)-98.4 F (36.9 C)] 98.2 F (36.8 C) (08/23 0250) Pulse Rate:  [53-66] 54 (08/23 0250) Cardiac Rhythm: Heart block (08/22 1956) Resp:  [17-20] 20 (08/23 0250) BP: (121-146)/(62-88) 129/65 (08/23 0250) SpO2:  [91 %-96 %] 96 % (08/23 0250)  Intake/Output from previous day: 08/22 0701 - 08/23 0700 In: 360 [P.O.:360] Out: 1500 [Urine:1450; Drains:50]  General appearance: alert, cooperative and no distress Heart: regular rate and rhythm Lungs: clear to auscultation bilaterally Abdomen: soft, non-tender; bowel sounds normal; no masses,  no organomegaly Extremities: extremities normal, atraumatic, no cyanosis or edema Wound: wound vac on sternum  Lab Results: No results for input(s): WBC, HGB, HCT, PLT in the last 72 hours. BMET: No results for input(s): NA, K, CL, CO2, GLUCOSE, BUN, CREATININE, CALCIUM in the last 72 hours.  PT/INR: No results for input(s): LABPROT, INR in the last 72 hours. ABG    Component Value Date/Time   PHART 7.271 (L) 10/30/2019 0506   HCO3 22.2 10/30/2019 0506   TCO2 24 10/30/2019 0506   ACIDBASEDEF 5.0 (H) 10/30/2019 0506   O2SAT 58.3 11/03/2019 0314   CBG (last 3)  Recent Labs    12/13/19 1634 12/13/19 2115 12/14/19 0618  GLUCAP 145* 105* 107*    Assessment/Plan: S/P Procedure(s) (LRB): WOUND VAC EXCHANGE (N/A) STERNAL WOUND DEBRIDEMENT (N/A) APPLICATION OF A-CELL OF BACK (N/A)  1. CV- NSR with 1st degree AV Block- continue Toprol XL, Cozaar, Amlodipine, ASA, Plavix, Eliquis 2. Pulm- no acute issues, 3. ID-remains afebrile, VAC changed Friday, per nursing notes issues with VAC  yesterday... vac restarted this morning, additional dressing applied, running at 125 mm HG setting and no leak detected.. have requested nursing staff to request new wound vac pump to see if this is equipment error 4. DM- sugars are controlled 5. Dispo- patient stable, continue current care, to OR sometime this week   LOS: 18 days    Ellwood Handler, PA-C' 12/14/2019 Pt seen and examined; agree with documentation. Plan VAC change in OR today. Kenard Morawski Z. Orvan Seen, [place]

## 2019-12-14 NOTE — H&P (Signed)
History and Physical Interval Note:  12/14/2019 12:40 PM  [name]  has presented today for surgery, with the diagnosis of STERNAL WOUND.  The various methods of treatment have been discussed with the patient and family. After consideration of risks, benefits and other options for treatment, the patient has consented to  Procedure(s): WOUND VAC CHANGE (N/A) as a surgical intervention.  The patient's history has been reviewed, patient examined, no change in status, stable for surgery.  I have reviewed the patient's chart and labs.  Questions were answered to the patient's satisfaction.     Wonda Olds

## 2019-12-14 NOTE — Anesthesia Postprocedure Evaluation (Signed)
Anesthesia Post Note  Patient: [name]  Procedure(s) Performed: WOUND VAC CHANGE (N/A Chest)     Patient location during evaluation: PACU Anesthesia Type: MAC Level of consciousness: awake and alert Pain management: pain level controlled Vital Signs Assessment: post-procedure vital signs reviewed and stable Respiratory status: spontaneous breathing, nonlabored ventilation and respiratory function stable Cardiovascular status: stable and blood pressure returned to baseline Anesthetic complications: no   No complications documented.  Last Vitals:  Vitals:   12/14/19 1515 12/14/19 1530  BP: 122/63   Pulse:    Resp:    Temp:    SpO2: 94% 95%    Last Pain:  Vitals:   12/14/19 1445  TempSrc:   PainSc: [name]

## 2019-12-14 NOTE — Transfer of Care (Signed)
Immediate Anesthesia Transfer of Care Note  Patient: [name]  Procedure(s) Performed: WOUND VAC CHANGE (N/A Chest)  Patient Location: PACU  Anesthesia Type:MAC  Level of Consciousness: drowsy and responds to stimulation  Airway & Oxygen Therapy: Patient Spontanous Breathing and Patient connected to face mask oxygen  Post-op Assessment: Report given to RN and Post -op Vital signs reviewed and stable  Post vital signs: Reviewed and stable  Last Vitals:  Vitals Value Taken Time  BP 102/55 12/14/19 1445  Temp 36.1 C 12/14/19 1440  Pulse 48 12/14/19 1446  Resp 18 12/14/19 1446  SpO2 99 % 12/14/19 1446  Vitals shown include unvalidated device data.  Last Pain:  Vitals:   12/14/19 0900  TempSrc:   PainSc: Asleep      Patients Stated Pain Goal: 0 (04/24/70 5366)  Complications: No complications documented.

## 2019-12-14 NOTE — Op Note (Signed)
Procedure(s): WOUND VAC CHANGE Procedure Note  GREIG ALTERGOTT male 59 y.o. 12/14/2019  Procedure(s) and Anesthesia Type:    * WOUND VAC CHANGE - Monitor Anesthesia Care  Surgeon(s) and Role:    Wonda Olds, MD - Primary   Indications: The patient is being treated for superficial sternal wound infection with slow wound healing. He is taken to the OR for wound inspection/debridement and VAC change     Surgeon: Wonda Olds   Assistants: staff  Anesthesia: Monitored Local Anesthesia with Sedation  ASA Class: 3    Procedure Detail  WOUND VAC CHANGE  After informed consent, he is taken to the OR on the above date and placed supine. Anesthesia is confirmed by conscious sedation. The chest is cleansed and draped. A preoperative pause is performed. The existing VAC dressing is taken down. The wound is thoroughly inspected and irrigated. There is good granulation tissue throughout. A vac foam is cut slightly smaller than the wound to encourage wound contraction. A VAC drape is used to cover the foam; this concluded the procedure.   Estimated Blood Loss:  Minimal         Drains: no   Blood Given: none          Specimens: no         Implants: none        Complications:  * No complications entered in OR log *         Disposition: PACU - hemodynamically stable.         Condition: stable

## 2019-12-14 NOTE — Anesthesia Preprocedure Evaluation (Addendum)
Anesthesia Evaluation  Patient identified by MRN, date of birth, ID band Patient awake    Reviewed: Allergy & Precautions, NPO status , Patient's Chart, lab work & pertinent test results  History of Anesthesia Complications Negative for: history of anesthetic complications  Airway Mallampati: II  TM Distance: >3 FB Neck ROM: Full    Dental  (+) Dental Advisory Given   Pulmonary Patient abstained from smoking., former smoker,    Pulmonary exam normal        Cardiovascular hypertension, Pt. on home beta blockers and Pt. on medications + CAD, + Past MI, + CABG, + Peripheral Vascular Disease and +CHF  Normal cardiovascular exam   '21 TTE - EF 30%. Mild concentric left ventricular hypertrophy. Mild dyskinesis of the entire left ventricular apex. There is severe hypokinesis of the left ventricular,  mid-apical anteroseptal wall, anterior wall, inferior wall and anterolateral wall. Right ventricular systolic function is mildly reduced. LA was severely dilated. RA was mildly dilated. Large pleural effusion in the left lateral region.    Neuro/Psych PSYCHIATRIC DISORDERS Anxiety negative neurological ROS     GI/Hepatic negative GI ROS, Neg liver ROS,   Endo/Other  diabetes, Type 2, Oral Hypoglycemic Agents  Renal/GU negative Renal ROS     Musculoskeletal negative musculoskeletal ROS (+)   Abdominal   Peds  Hematology  On eliquis    Anesthesia Other Findings   Reproductive/Obstetrics                            Anesthesia Physical Anesthesia Plan  ASA: III  Anesthesia Plan: MAC   Post-op Pain Management:    Induction: Intravenous  PONV Risk Score and Plan: 1 and Propofol infusion and Treatment may vary due to age or medical condition  Airway Management Planned: Natural Airway and Simple Face Mask  Additional Equipment: None  Intra-op Plan:   Post-operative Plan:   Informed Consent: I  have reviewed the patients History and Physical, chart, labs and discussed the procedure including the risks, benefits and alternatives for the proposed anesthesia with the patient or authorized representative who has indicated his/her understanding and acceptance.       Plan Discussed with: CRNA and Anesthesiologist  Anesthesia Plan Comments:        Anesthesia Quick Evaluation

## 2019-12-15 ENCOUNTER — Encounter (HOSPITAL_COMMUNITY): Payer: Self-pay | Admitting: Cardiothoracic Surgery

## 2019-12-15 LAB — GLUCOSE, CAPILLARY
Glucose-Capillary: 72 mg/dL (ref 70–99)
Glucose-Capillary: 154 mg/dL — ABNORMAL HIGH (ref 70–99)
Glucose-Capillary: 48 mg/dL — ABNORMAL LOW (ref 70–99)
Glucose-Capillary: 133 mg/dL — ABNORMAL HIGH (ref 70–99)
Glucose-Capillary: 135 mg/dL — ABNORMAL HIGH (ref 70–99)

## 2019-12-15 NOTE — Progress Notes (Signed)
Inpatient Diabetes Program Recommendations  AACE/ADA: New Consensus Statement on Inpatient Glycemic Control (2015)  Target Ranges:  Prepandial:   less than 140 mg/dL      Peak postprandial:   less than 180 mg/dL (1-2 hours)      Critically ill patients:  140 - 180 mg/dL   Lab Results  Component Value Date   GLUCAP 72 12/15/2019   HGBA1C 10.6 (H) 10/25/2019    Review of Glycemic Control Results for Quinto, Lee L "MIKE" (MRN [id number]) as of 12/15/2019 08:58  Ref. Range 12/14/2019 14:42 12/14/2019 15:21 12/14/2019 16:53 12/14/2019 21:36 12/15/2019 06:22  Glucose-Capillary Latest Ref Range: 70 - 99 mg/dL 57 (L) 81 87 113 (H) 72   Diabetes history: DM 2 Outpatient Diabetes medications:  Metformin 5750 mg bid, Trulicity 5.18 mg weekly, Jardiance 25 mg daily Current orders for Inpatient glycemic control:  Novolog moderate tid with meals & HS Jardiance 25 mg daily Levemir 17 units BID Metformin 1000 mg BID Novolog 4 units TID Inpatient Diabetes Program Recommendations:  Noted mild hypoglycemia yesterday during intraop procedure. FSBG was 72 mg/dL. May want to consider slight decrease to Levemir 15 units BID.   Thanks, Bronson Curb, MSN, RNC-OB Diabetes Coordinator [phone] (8a-5p)

## 2019-12-15 NOTE — Progress Notes (Addendum)
      [address]       [place] [address]             [phone]        1 Day Post-Op Procedure(s) (LRB): WOUND VAC CHANGE (N/A)  Subjective: Patient has a fair amount of pain at sternal wound VAC this am.  Objective: Vital signs in last 24 hours: Temp:  [97 F (36.1 C)-98.7 F (37.1 C)] 98.2 F (36.8 C) (08/24 0327) Pulse Rate:  [47-74] 72 (08/24 0327) Cardiac Rhythm: Heart block (08/23 1920) Resp:  [12-18] 18 (08/24 0327) BP: (83-154)/(53-81) 128/56 (08/24 0327) SpO2:  [94 %-100 %] 99 % (08/24 0327) Weight:  [75.1 kg-78.1 kg] 75.1 kg (08/24 0329)   Current Weight  12/15/19 75.1 kg       Intake/Output from previous day: 08/23 0701 - 08/24 0700 In: 400 [I.V.:300; IV Piggyback:100] Out: 1450 [Urine:1450]   Physical Exam:  Cardiovascular: Slightly bradycardic this am Extremities: No lower extremity edema. Wounds: Sternal wound with VAC in place and is functioning  Lab Results: CBC:No results for input(s): WBC, HGB, HCT, PLT in the last 72 hours. BMET:  No results for input(s): NA, K, CL, CO2, GLUCOSE, BUN, CREATININE, CALCIUM in the last 72 hours.  PT/INR:  Lab Results  Component Value Date   INR 1.3 (H) 11/26/2019   INR 1.5 (H) 10/29/2019   INR 1.2 10/29/2019   ABG:  INR: Will add last result for INR, ABG once components are confirmed Will add last 4 CBG results once components are confirmed  Assessment/Plan:  1. CV - SB,SRR, first degree heart block. On Toprol XL 25 mg daily, Amlodipine 5 mg daily, Isordil 10 mg tid, Plavix 75 mg daily, ec asa 81 mg daily, Losartan 100 mg daily, Apixaban 5 mg bid.  2.  Pulmonary - On 2 liters of oxygen via [place] at night (was on prior). 3. ID-Klebsiella Oxytoca. On Ceftriaxone for sternal wound.  4. Volume overload-on Lasix 40 mg daily. 5. DM-CBGs 87/113/72. On Metformin 1000 mg bid and Jardiance 25 mg daily.   Kyerra Vargo M ZimmermanPA-C 12/15/2019,8:36 AM

## 2019-12-16 LAB — GLUCOSE, CAPILLARY
Glucose-Capillary: 81 mg/dL (ref 70–99)
Glucose-Capillary: 175 mg/dL — ABNORMAL HIGH (ref 70–99)
Glucose-Capillary: 95 mg/dL (ref 70–99)
Glucose-Capillary: 114 mg/dL — ABNORMAL HIGH (ref 70–99)

## 2019-12-16 NOTE — Progress Notes (Signed)
      [address]       [place] [address]             [phone]      2 Days Post-Op Procedure(s) (LRB): WOUND VAC CHANGE (N/A)   Subjective:  No new complaints.  Continues to have pain at times.  Objective: Vital signs in last 24 hours: Temp:  [97.7 F (36.5 C)-98.4 F (36.9 C)] 98.3 F (36.8 C) (08/25 0340) Pulse Rate:  [52-65] 52 (08/25 0340) Cardiac Rhythm: Heart block (08/24 1929) Resp:  [13-20] 18 (08/25 0340) BP: (127-147)/(61-73) 147/65 (08/25 0340) SpO2:  [92 %-99 %] 98 % (08/25 0340) Weight:  [75 kg] 75 kg (08/25 0344)  Intake/Output from previous day: 08/24 0701 - 08/25 0700 In: 120 [P.O.:120] Out: 475 [Urine:475]  General appearance: alert, cooperative and no distress Heart: regular rate and rhythm, brady Lungs: clear to auscultation bilaterally Abdomen: soft, non-tender; bowel sounds normal; no masses,  no organomegaly Extremities: extremities normal, atraumatic, no cyanosis or edema Wound: wound vac in place on sternotomy  Lab Results: No results for input(s): WBC, HGB, HCT, PLT in the last 72 hours. BMET: No results for input(s): NA, K, CL, CO2, GLUCOSE, BUN, CREATININE, CALCIUM in the last 72 hours.  PT/INR: No results for input(s): LABPROT, INR in the last 72 hours. ABG    Component Value Date/Time   PHART 7.271 (L) 10/30/2019 0506   HCO3 22.2 10/30/2019 0506   TCO2 24 10/30/2019 0506   ACIDBASEDEF 5.0 (H) 10/30/2019 0506   O2SAT 58.3 11/03/2019 0314   CBG (last 3)  Recent Labs    12/15/19 1748 12/15/19 2118 12/16/19 0611  GLUCAP 133* 135* 81    Assessment/Plan: S/P Procedure(s) (LRB): WOUND VAC CHANGE (N/A)  1. CV- Sinus Bradycardia, BP stable- on Norvasc, Isordil, Cozaar, Toprol XL... will stop Isordil due to >30 days of NTG therapy 2. Pulm- no acute issues, continue IS 3. ID- Klebsiella Oxytoca.. vac changed Monday, remains afebrile, no leukocytosis 4. DM- cbgs controlled, continue Metformin, Jardiance    LOS: 20 days    Ellwood Handler, PA-C  12/16/2019

## 2019-12-16 NOTE — Progress Notes (Signed)
Inpatient Diabetes Program Recommendations  AACE/ADA: New Consensus Statement on Inpatient Glycemic Control (2015)  Target Ranges:  Prepandial:   less than 140 mg/dL      Peak postprandial:   less than 180 mg/dL (1-2 hours)      Critically ill patients:  140 - 180 mg/dL   Lab Results  Component Value Date   GLUCAP 81 12/16/2019   HGBA1C 10.6 (H) 10/25/2019    Review of Glycemic Control Results for [name], Lee L "MIKE" (MRN [id number]) as of 12/16/2019 10:50  Ref. Range 12/15/2019 11:48 12/15/2019 16:39 12/15/2019 17:48 12/15/2019 21:18 12/16/2019 06:11  Glucose-Capillary Latest Ref Range: 70 - 99 mg/dL 154 (H) 48 (L) 133 (H) 135 (H) 81    Inpatient Diabetes Program Recommendations:    Levemir 15 units daily  Novolog 3 units tid with meals if eats at least 50%   Will continue to follow while inpatient.  Thank you, Reche Dixon, RN, BSN Diabetes Coordinator Inpatient Diabetes Program [phone] (team pager from 8a-5p)

## 2019-12-17 LAB — GLUCOSE, CAPILLARY
Glucose-Capillary: 116 mg/dL — ABNORMAL HIGH (ref 70–99)
Glucose-Capillary: 156 mg/dL — ABNORMAL HIGH (ref 70–99)
Glucose-Capillary: 93 mg/dL (ref 70–99)
Glucose-Capillary: 136 mg/dL — ABNORMAL HIGH (ref 70–99)

## 2019-12-17 NOTE — Plan of Care (Signed)
Continue to monitor

## 2019-12-17 NOTE — Progress Notes (Signed)
Nutrition Follow Up  DOCUMENTATION CODES:   Not applicable  INTERVENTION:    Ensure Enlive po BID, each supplement provides 350 kcal and 20 grams of protein  MVI daily   NUTRITION DIAGNOSIS:   Increased nutrient needs related to wound healing as evidenced by estimated needs.  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 Presents this admission with lower sternal drainage.   8/6- sternal wound debridement, wound VAC 8/10- sternal wound debridement, wound VAC change 8/16- sternal wound debridement, wound VAC change 8/23- sternal wound debridement, wound VAC change   Plan VAC change at bedside today and possible d/c.   Intake progressing. Last five meal completions charted as 50-100%. Looks to be taking Ensure BID. Had some hypoglycemic episodes a few days ago. CBGs now wdl. Continue current interventions.   Admission weight: 79.6 kg   Current weight: 75.2 kg   Medications: dulcolax, jardiance, 40 mg lasix daily, SS novolog, levemir, metformin, MVI, 30 mEq KCl daily, senokot  Labs: CBG 81-156  Diet Order:   Diet Order            Diet heart healthy/carb modified Room service appropriate? Yes; Fluid consistency: Thin  Diet effective now                 EDUCATION NEEDS:   Not appropriate for education at this time  Skin:  Skin Assessment: Skin Integrity Issues: Skin Integrity Issues:: Incisions, Wound VAC Wound Vac: chest Incisions: chest  Last BM:  8/25  Height:   Ht Readings from Last 1 Encounters:  12/14/19 5' 7.01" (1.702 m)    Weight:   Wt Readings from Last 1 Encounters:  12/17/19 75.2 kg    BMI:  Body mass index is 25.96 kg/m.  Estimated Nutritional Needs:   Kcal:  2200-2400  kcal  Protein:  115-130 grams  Fluid:  >/= 2.2 L/day   Mariana Single RD, LDN Clinical Nutrition Pager listed in [place]

## 2019-12-17 NOTE — Progress Notes (Signed)
3 Days Post-Op Procedure(s) (LRB): WOUND VAC CHANGE (N/A) Subjective: No complaints  Objective: Vital signs in last 24 hours: Temp:  [97.6 F (36.4 C)-98.5 F (36.9 C)] 97.6 F (36.4 C) (08/26 0752) Pulse Rate:  [60-78] 75 (08/26 0752) Cardiac Rhythm: Heart block (08/26 0755) Resp:  [13-20] 16 (08/26 0752) BP: (122-161)/(61-96) 161/96 (08/26 0752) SpO2:  [92 %-95 %] 95 % (08/26 0345) Weight:  [75.2 kg] 75.2 kg (08/26 0259)  Hemodynamic parameters for last 24 hours:    Intake/Output from previous day: 08/25 0701 - 08/26 0700 In: -  Out: 1500 [Urine:1500] Intake/Output this shift: No intake/output data recorded.  General appearance: alert and cooperative Neurologic: intact Heart: regular rate and rhythm, S1, S2 normal, no murmur, click, rub or gallop Lungs: clear to auscultation bilaterally Abdomen: soft, non-tender; bowel sounds normal; no masses,  no organomegaly Extremities: extremities normal, atraumatic, no cyanosis or edema Wound: dressing intact; vac parameters reviewed  Lab Results: No results for input(s): WBC, HGB, HCT, PLT in the last 72 hours. BMET: No results for input(s): NA, K, CL, CO2, GLUCOSE, BUN, CREATININE, CALCIUM in the last 72 hours.  PT/INR: No results for input(s): LABPROT, INR in the last 72 hours. ABG    Component Value Date/Time   PHART 7.271 (L) 10/30/2019 0506   HCO3 22.2 10/30/2019 0506   TCO2 24 10/30/2019 0506   ACIDBASEDEF 5.0 (H) 10/30/2019 0506   O2SAT 58.3 11/03/2019 0314   CBG (last 3)  Recent Labs    12/16/19 1652 12/16/19 2143 12/17/19 0608  GLUCAP 95 114* 116*    Assessment/Plan: S/P Procedure(s) (LRB): WOUND VAC CHANGE (N/A) vac change at the bedside and likely discharge home after that   LOS: 21 days    Wonda Olds 12/17/2019

## 2019-12-17 NOTE — Progress Notes (Signed)
      [address]       [place] [address]             [phone]      3 Days Post-Op Procedure(s) (LRB): WOUND VAC CHANGE (N/A)   Subjective:  Continues to have pain. States he feels like his wound is getting smaller.  Objective: Vital signs in last 24 hours: Temp:  [97.6 F (36.4 C)-98.5 F (36.9 C)] 98.5 F (36.9 C) (08/26 0345) Pulse Rate:  [60-78] 60 (08/26 0345) Cardiac Rhythm: Sinus bradycardia (08/25 1900) Resp:  [13-20] 17 (08/26 0345) BP: (122-159)/(61-74) 122/61 (08/26 0345) SpO2:  [92 %-95 %] 95 % (08/26 0345) Weight:  [75.2 kg] 75.2 kg (08/26 0259)  Intake/Output from previous day: 08/25 0701 - 08/26 0700 In: -  Out: 1500 [Urine:1500]  General appearance: alert, cooperative and no distress Heart: regular rate and rhythm Lungs: clear to auscultation bilaterally Abdomen: soft, non-tender; bowel sounds normal; no masses,  no organomegaly Extremities: extremities normal, atraumatic, no cyanosis or edema Wound: wound vac in place on sternotomy  Lab Results: No results for input(s): WBC, HGB, HCT, PLT in the last 72 hours. BMET: No results for input(s): NA, K, CL, CO2, GLUCOSE, BUN, CREATININE, CALCIUM in the last 72 hours.  PT/INR: No results for input(s): LABPROT, INR in the last 72 hours. ABG    Component Value Date/Time   PHART 7.271 (L) 10/30/2019 0506   HCO3 22.2 10/30/2019 0506   TCO2 24 10/30/2019 0506   ACIDBASEDEF 5.0 (H) 10/30/2019 0506   O2SAT 58.3 11/03/2019 0314   CBG (last 3)  Recent Labs    12/16/19 1652 12/16/19 2143 12/17/19 0608  GLUCAP 95 114* 116*    Assessment/Plan: S/P Procedure(s) (LRB): WOUND VAC CHANGE (N/A)  1. CV- NSR, BP stable- Toprol XL, Cozaar, Norvasc 2. Pulm- no acute issues, continue IS 3. ID- Klebsiella Oxytoca- vac changed Monday, remains afebrile. On ABX 4. DM- sugars remain controlled, continue current regimen 5. Dispo- patient stable, continue current care, wound vac change per Dr.  Orvan Seen   LOS: 21 days    Ellwood Handler, PA-C 12/17/2019

## 2019-12-18 LAB — GLUCOSE, CAPILLARY
Glucose-Capillary: 81 mg/dL (ref 70–99)
Glucose-Capillary: 104 mg/dL — ABNORMAL HIGH (ref 70–99)

## 2019-12-18 MED ORDER — LOSARTAN POTASSIUM 100 MG PO TABS
100.0000 mg | ORAL_TABLET | Freq: Every day | ORAL | 1 refills | Status: DC
Start: 2019-12-18 — End: 2020-06-06

## 2019-12-18 MED ORDER — CIPROFLOXACIN HCL 500 MG PO TABS: 500.0000 mg | ORAL_TABLET | Freq: Two times a day (BID) | ORAL | 0 refills | Status: AC

## 2019-12-18 MED ORDER — AMLODIPINE BESYLATE 10 MG PO TABS
10.0000 mg | ORAL_TABLET | Freq: Every day | ORAL | Status: DC
  Administered 2019-12-18: 10 mg via ORAL
  Filled 2019-12-18: qty 1

## 2019-12-18 MED ORDER — OXYCODONE HCL 5 MG PO TABS
5.0000 mg | ORAL_TABLET | ORAL | 0 refills | Status: DC | PRN
Start: 2019-12-18 — End: 2020-06-06

## 2019-12-18 MED ORDER — METOPROLOL SUCCINATE ER 25 MG PO TB24
25.0000 mg | ORAL_TABLET | Freq: Every day | ORAL | 1 refills | Status: DC
Start: 2019-12-18 — End: 2020-06-06

## 2019-12-18 MED ORDER — LORAZEPAM 2 MG/ML IJ SOLN
0.5000 mg | Freq: Once | INTRAMUSCULAR | Status: AC
  Administered 2019-12-18: 0.5 mg via INTRAVENOUS
  Filled 2019-12-18: qty 1

## 2019-12-18 MED ORDER — AMLODIPINE BESYLATE 10 MG PO TABS
10.0000 mg | ORAL_TABLET | Freq: Every day | ORAL | 1 refills | Status: DC
Start: 2019-12-18 — End: 2020-06-06

## 2019-12-18 NOTE — Progress Notes (Signed)
Discharge orders received. Wound vac changed over to unit for home.  IV removed and telemetry discontinued. CCMD notified. Discharge instructions reviewed. Pt expresses understanding. No needs at this time.  Ride expected to be here at 11:30 a.m.

## 2019-12-18 NOTE — Progress Notes (Addendum)
      [address]       [place] [address]             [phone]        4 Days Post-Op Procedure(s) (LRB): WOUND VAC CHANGE (N/A)  Subjective: Patient not looking forward to bedside VAC change  Objective: Vital signs in last 24 hours: Temp:  [97.6 F (36.4 C)-98.2 F (36.8 C)] 97.7 F (36.5 C) (08/27 0500) Pulse Rate:  [56-75] 62 (08/27 0500) Cardiac Rhythm: Sinus bradycardia;Heart block (08/26 1900) Resp:  [11-20] 11 (08/27 0500) BP: (141-174)/(70-96) 156/74 (08/27 0500) SpO2:  [96 %] 96 % (08/27 0500) Weight:  [76 kg] 76 kg (08/27 0500)   Current Weight  12/18/19 76 kg       Intake/Output from previous day: 08/26 0701 - 08/27 0700 In: -  Out: 850 [Urine:800; Drains:50]   Physical Exam:  Cardiovascular: Slightly bradycardic this am Pulmonary: Clear to auscultation bilaterally Abdomen: Soft, non tender, bowel sounds present. Extremities: No lower extremity edema. Wounds: Sternal wound with VAC in place and with bloody like drainage in cannister  Lab Results: CBC:No results for input(s): WBC, HGB, HCT, PLT in the last 72 hours. BMET:  No results for input(s): NA, K, CL, CO2, GLUCOSE, BUN, CREATININE, CALCIUM in the last 72 hours.  PT/INR:  Lab Results  Component Value Date   INR 1.3 (H) 11/26/2019   INR 1.5 (H) 10/29/2019   INR 1.2 10/29/2019   ABG:  INR: Will add last result for INR, ABG once components are confirmed Will add last 4 CBG results once components are confirmed  Assessment/Plan: 1. CV - SB,SRR, first degree heart block. On Toprol XL 25 mg daily, Amlodipine 5 mg daily,Plavix 75 mg daily, ec asa 81 mg daily, Losartan 100 mg daily, Apixaban 5 mg bid. Hypertensive so will increase Amlodipine. 2.  Pulmonary - On 2 liters of oxygen via [place] at night (was on prior) 3. ID-Klebsiella Oxytoca. On Cefipime for sternal wound. Per Dr. Orvan Seen, will change to oral Cipro at discharge.  4. Volume overload-on Lasix 40 mg daily. 5.  DM-CBGs 93/136/81. On Metformin 1000 mg bid and Jardiance 25 mg daily. 6. Bedside wound VAC change today. As discussed with Dr. Orvan Seen, will stop IV antibiotic and give oral. Patient will be seen in office on Monday 08/30  Nialah Saravia M ZimmermanPA-C 12/18/2019,7:11 AM

## 2019-12-18 NOTE — Plan of Care (Signed)
Continue to monitor

## 2019-12-21 ENCOUNTER — Ambulatory Visit (INDEPENDENT_AMBULATORY_CARE_PROVIDER_SITE_OTHER): Payer: Self-pay | Admitting: Cardiothoracic Surgery

## 2019-12-21 ENCOUNTER — Other Ambulatory Visit: Payer: Self-pay

## 2019-12-21 VITALS — BP 160/90 | HR 67 | Temp 97.6°F | Resp 20 | Ht 67.0 in | Wt 167.0 lb

## 2019-12-21 DIAGNOSIS — Z951 Presence of aortocoronary bypass graft: Secondary | ICD-10-CM

## 2019-12-21 DIAGNOSIS — S21101A Unspecified open wound of right front wall of thorax without penetration into thoracic cavity, initial encounter: Secondary | ICD-10-CM

## 2019-12-21 DIAGNOSIS — L089 Local infection of the skin and subcutaneous tissue, unspecified: Secondary | ICD-10-CM

## 2019-12-21 NOTE — Progress Notes (Signed)
The patient presents for wound VAC change.  He was recently admitted for several days during which time he underwent multiple operative wound VAC changes and debridements.  He has been doing well since discharged.   Physical exam: The wound VAC is taken down.  There is generous granulation tissue.  The wound bed is treated with ACell material.  This was then covered with a VAC drape.  Patient tolerated this well  Assessment and plan: Follow-up on Friday for repeat VAC change  Jasmina Gendron Z. Orvan Seen, [place]

## 2019-12-25 ENCOUNTER — Other Ambulatory Visit: Payer: Self-pay

## 2019-12-25 ENCOUNTER — Ambulatory Visit: Payer: Self-pay | Admitting: Cardiothoracic Surgery

## 2019-12-25 VITALS — Ht 67.0 in

## 2019-12-25 DIAGNOSIS — Z951 Presence of aortocoronary bypass graft: Secondary | ICD-10-CM

## 2019-12-25 DIAGNOSIS — I25110 Atherosclerotic heart disease of native coronary artery with unstable angina pectoris: Secondary | ICD-10-CM

## 2019-12-25 DIAGNOSIS — S21101A Unspecified open wound of right front wall of thorax without penetration into thoracic cavity, initial encounter: Secondary | ICD-10-CM

## 2019-12-27 NOTE — Progress Notes (Signed)
The patient presents for wound VAC change.  He states he has had no troubles at home since the last change on Monday.  He denies any fevers or chills or issues with the sternum otherwise.  The existing wound VAC is taken down.  The wound is thoroughly examined and is consistent with improving granulation tissue.  ACell product is applied to the bed of the wound followed by wound VAC sponge.  The patient tolerated the procedure well.  He is to report next week for another VAC change.

## 2019-12-29 ENCOUNTER — Ambulatory Visit: Payer: Self-pay | Admitting: Cardiothoracic Surgery

## 2019-12-29 ENCOUNTER — Other Ambulatory Visit: Payer: Self-pay

## 2019-12-29 ENCOUNTER — Encounter: Payer: Self-pay | Admitting: Cardiothoracic Surgery

## 2019-12-29 VITALS — BP 111/68 | HR 57 | Temp 97.8°F | Resp 20 | Ht 67.0 in | Wt 165.0 lb

## 2019-12-29 DIAGNOSIS — S21101A Unspecified open wound of right front wall of thorax without penetration into thoracic cavity, initial encounter: Secondary | ICD-10-CM

## 2019-12-29 DIAGNOSIS — Z951 Presence of aortocoronary bypass graft: Secondary | ICD-10-CM

## 2019-12-29 MED ORDER — OXYCODONE HCL 5 MG PO TABS: 5.0000 mg | ORAL_TABLET | Freq: Three times a day (TID) | ORAL | 0 refills | Status: DC | PRN

## 2019-12-30 ENCOUNTER — Other Ambulatory Visit: Payer: Self-pay | Admitting: Physician Assistant

## 2019-12-30 ENCOUNTER — Ambulatory Visit (INDEPENDENT_AMBULATORY_CARE_PROVIDER_SITE_OTHER): Payer: Self-pay

## 2019-12-30 DIAGNOSIS — Z5189 Encounter for other specified aftercare: Secondary | ICD-10-CM

## 2019-12-30 DIAGNOSIS — Z4889 Encounter for other specified surgical aftercare: Secondary | ICD-10-CM

## 2019-12-31 ENCOUNTER — Ambulatory Visit: Payer: Self-pay | Admitting: Cardiothoracic Surgery

## 2019-12-31 ENCOUNTER — Other Ambulatory Visit: Payer: Self-pay

## 2019-12-31 DIAGNOSIS — Z951 Presence of aortocoronary bypass graft: Secondary | ICD-10-CM

## 2019-12-31 DIAGNOSIS — Z09 Encounter for follow-up examination after completed treatment for conditions other than malignant neoplasm: Secondary | ICD-10-CM

## 2019-12-31 DIAGNOSIS — T8132XA Disruption of internal operation (surgical) wound, not elsewhere classified, initial encounter: Secondary | ICD-10-CM

## 2019-12-31 NOTE — Progress Notes (Signed)
The patient presents to the office today for wound VAC change.  He had undergone wound VAC change on Tuesday but apparently overnight the seal loosened or the suction tubing became clogged and the dressing was ineffective.  He has had no other complaints  Physical exam: Dressing is taken down.  There is good granulation tissue at the bed of the wound.  A VAC foam was cut to fit the defect and covered with VAC drape.  A good seal was achieved.  Assessment and plan: Return to office on Monday afternoon for VAC change.  Report any troubles with the [facility] to the on-call physician over the weekend.

## 2019-12-31 NOTE — Progress Notes (Signed)
The patient presents for wound VAC change.  He has done well over the weekend and has no complaints.  Physical exam: The existing wound VAC dressing is taken down.  There is good granulation tissue observed.  The wound is treated with ACell biological material.  A new VAC foam was cut to fit the defect and covered with drape.  This is then applied to the suction canister which is working well.  Assessment: Doing well with wound VAC therapy  Plan: Return to office on Friday for another wound VAC change.  Darcey Demma Z. Orvan Seen, [place]

## 2020-01-01 ENCOUNTER — Ambulatory Visit: Payer: Medicaid Other | Admitting: Cardiothoracic Surgery

## 2020-01-04 ENCOUNTER — Ambulatory Visit: Payer: Self-pay | Admitting: Cardiothoracic Surgery

## 2020-01-04 ENCOUNTER — Other Ambulatory Visit: Payer: Self-pay

## 2020-01-04 VITALS — BP 132/78 | HR 80 | Temp 97.6°F | Resp 20 | Ht 67.0 in | Wt 162.0 lb

## 2020-01-04 DIAGNOSIS — Z09 Encounter for follow-up examination after completed treatment for conditions other than malignant neoplasm: Secondary | ICD-10-CM

## 2020-01-04 DIAGNOSIS — Z951 Presence of aortocoronary bypass graft: Secondary | ICD-10-CM

## 2020-01-04 NOTE — Progress Notes (Signed)
Pt presents for wound vac change; no difficulties over the weekend with existing dressing.  Wound vac change tolerated well; very good granulation tissue.   A/p: Return in 3 days for next wound vac change. [name] Z. Orvan Seen, [place]

## 2020-01-07 ENCOUNTER — Other Ambulatory Visit: Payer: Self-pay | Admitting: Physician Assistant

## 2020-01-07 ENCOUNTER — Ambulatory Visit: Payer: Self-pay

## 2020-01-08 ENCOUNTER — Other Ambulatory Visit: Payer: Self-pay

## 2020-01-08 ENCOUNTER — Ambulatory Visit (INDEPENDENT_AMBULATORY_CARE_PROVIDER_SITE_OTHER): Payer: Self-pay | Admitting: Physician Assistant

## 2020-01-08 VITALS — Ht 67.0 in | Wt 162.0 lb

## 2020-01-08 DIAGNOSIS — Z09 Encounter for follow-up examination after completed treatment for conditions other than malignant neoplasm: Secondary | ICD-10-CM

## 2020-01-08 DIAGNOSIS — Z951 Presence of aortocoronary bypass graft: Secondary | ICD-10-CM

## 2020-01-08 NOTE — Progress Notes (Signed)
  HPI:  Patient returns for sternal wound VAC change. He had a scheduled appointment for yesterday, but thought appointment was for today.  Current Outpatient Medications  Medication Sig Dispense Refill  . acetaminophen (TYLENOL) 500 MG tablet Take 1,000 mg by mouth every 6 (six) hours as needed for headache.    [name] Kitchen amLODipine (NORVASC) 10 MG tablet Take 1 tablet (10 mg total) by mouth daily. 30 tablet 1  . apixaban (ELIQUIS) 5 MG TABS tablet Take 1 tablet (5 mg total) by mouth 2 (two) times daily. 60 tablet 1  . atorvastatin (LIPITOR) 40 MG tablet Take 1 tablet (40 mg total) by mouth daily. 30 tablet 1  . clopidogrel (PLAVIX) 75 MG tablet Take 1 tablet (75 mg total) by mouth daily. 30 tablet 1  . empagliflozin (JARDIANCE) 25 MG TABS tablet Take 25 mg by mouth daily.    . furosemide (LASIX) 40 MG tablet Take 1 tablet (40 mg total) by mouth daily. 60 tablet 1  . lidocaine (LIDODERM) 5 % Place 2 patches onto the skin every 12 (twelve) hours. Remove & Discard patch within 12 hours or as directed by MD 30 patch 2  . losartan (COZAAR) 100 MG tablet Take 1 tablet (100 mg total) by mouth daily. 30 tablet 1  . metFORMIN (GLUCOPHAGE) 1000 MG tablet Take 1,000 mg by mouth 2 (two) times daily with a meal.     . metoprolol succinate (TOPROL-XL) 25 MG 24 hr tablet Take 1 tablet (25 mg total) by mouth daily. 60 tablet 1  . Multiple Vitamins-Minerals (CENTRUM SILVER) tablet Take 1 tablet by mouth daily.    [name] Kitchen oxyCODONE (OXY IR/ROXICODONE) 5 MG immediate release tablet Take 1 tablet (5 mg total) by mouth every 4 (four) hours as needed for severe pain. 30 tablet 0  . oxyCODONE (ROXICODONE) 5 MG immediate release tablet Take 1 tablet (5 mg total) by mouth every 8 (eight) hours as needed. 30 tablet 0  . potassium chloride (KLOR-CON) 10 MEQ tablet Take 2 tablets (20 mEq total) by mouth daily. 60 tablet 1  . Skin Protectants, Misc. (EUCERIN) cream Apply topically daily. (Patient taking differently: Apply 1 application  topically daily as needed for dry skin. ) 454 g 0  . traMADol (ULTRAM) 50 MG tablet TAKE 1 TABLET BY MOUTH EVERY 6 HOURS AS NEEDED FOR MODERATE PAIN 30 tablet 0  . triamcinolone cream (KENALOG) 0.1 % Apply 1 application topically 2 (two) times daily. (Patient taking differently: Apply 1 application topically 2 (two) times daily as needed (rash). ) 30 g 0  . TRULICITY 0.07 HQ/1.9XJ SOPN Inject 0.75 mg into the skin once a week.    Physical Exam: Sternal wound VAC removed. Wound has a lot of granulation tissue and is clean and dry. New VAC placed and suctioning well.   Impression and Plan: Patient's sternal wound is continuing to heal well. He will return on Monday for another wound VAC change.     Nani Skillern, PA-C [facility] [phone]

## 2020-01-11 ENCOUNTER — Ambulatory Visit: Payer: Self-pay | Admitting: Cardiothoracic Surgery

## 2020-01-11 ENCOUNTER — Other Ambulatory Visit: Payer: Self-pay

## 2020-01-11 DIAGNOSIS — Z951 Presence of aortocoronary bypass graft: Secondary | ICD-10-CM

## 2020-01-11 DIAGNOSIS — Z09 Encounter for follow-up examination after completed treatment for conditions other than malignant neoplasm: Secondary | ICD-10-CM

## 2020-01-11 NOTE — Progress Notes (Signed)
The patient presents for another wound VAC change.  He has had no difficulty since last week when it was changed last.  Physical exam: Wound VAC is taken down and there is beefy granulation tissue and a very shallow wound; the wound VAC is changed without difficulty  Impression: Doing well with VAC therapy  Plan: Follow-up on Friday for VAC change and potential discontinuation of VAC therapy  Ronal Maybury Z. Orvan Seen, [place]

## 2020-01-11 NOTE — Progress Notes (Signed)
Patient called the office and stated that his wound vac had been beeping all night long and the suction on the vac was inadequate.  Patient in the office today to check the wound vac. Upon observation vac seems to be working appropriately.  Left lateral side of dressing did seem to have a shorter edge not creating as great of suction to it.  Applied more dressing film to left side.  Turned on the vac and worked appropriately.  Advised patient to stay for a few more minutes in the office before he left just to make sure the vac did not alarm again. No alarms.

## 2020-01-15 ENCOUNTER — Other Ambulatory Visit: Payer: Self-pay

## 2020-01-15 ENCOUNTER — Ambulatory Visit: Payer: Self-pay

## 2020-01-15 DIAGNOSIS — Z09 Encounter for follow-up examination after completed treatment for conditions other than malignant neoplasm: Secondary | ICD-10-CM

## 2020-01-15 NOTE — Progress Notes (Signed)
D/C wound vac. Changed to wet to dry daily. Will return next week for wound check with Dr Orvan Seen

## 2020-01-22 ENCOUNTER — Ambulatory Visit: Payer: Self-pay | Admitting: Cardiothoracic Surgery

## 2020-01-25 ENCOUNTER — Ambulatory Visit: Payer: Self-pay | Admitting: Cardiothoracic Surgery

## 2020-01-25 ENCOUNTER — Other Ambulatory Visit: Payer: Self-pay

## 2020-01-25 ENCOUNTER — Encounter: Payer: Self-pay | Admitting: Cardiothoracic Surgery

## 2020-01-25 VITALS — BP 99/66 | HR 66 | Temp 97.7°F | Resp 20 | Wt 167.4 lb

## 2020-01-25 DIAGNOSIS — Z951 Presence of aortocoronary bypass graft: Secondary | ICD-10-CM

## 2020-01-25 NOTE — Progress Notes (Signed)
The patient presents for wound check.  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  This has been treated with negative pressure wound therapy is for several weeks and has recently gone to wet-to-dry changes. He states he has been treating the wound with peroxide followed by gauze dressings once per day.  He denies any fevers or chills but does note mild sternal instability  Physical exam: Well-healed sternal incision with granulation tissue in the lower two thirds of the incision at the level of the skin.  This is treated with silver nitrate.  The wound is then covered with a nonadherent gauze  Impression: improving slowly.  He is instructed to wash with soapy water daily and to cover with a nonadherent dressing.  He is discouraged from using hydrogen peroxide.  Plan: Follow-up on Friday for wound check  Adaliz Dobis Z. Orvan Seen, [place]

## 2020-01-27 ENCOUNTER — Other Ambulatory Visit: Payer: Self-pay | Admitting: Cardiothoracic Surgery

## 2020-01-29 ENCOUNTER — Other Ambulatory Visit: Payer: Self-pay

## 2020-01-29 ENCOUNTER — Ambulatory Visit: Payer: Medicaid Other | Admitting: Cardiothoracic Surgery

## 2020-01-29 ENCOUNTER — Encounter: Payer: Self-pay | Admitting: Cardiothoracic Surgery

## 2020-01-29 VITALS — BP 129/79 | HR 79 | Resp 18 | Ht 67.0 in | Wt 169.2 lb

## 2020-01-29 DIAGNOSIS — Z5189 Encounter for other specified aftercare: Secondary | ICD-10-CM

## 2020-02-05 ENCOUNTER — Other Ambulatory Visit: Payer: Self-pay

## 2020-02-05 ENCOUNTER — Ambulatory Visit: Payer: Medicaid Other | Admitting: Cardiothoracic Surgery

## 2020-02-05 VITALS — BP 113/70 | HR 75 | Temp 97.8°F | Resp 20 | Ht 67.0 in | Wt 167.0 lb

## 2020-02-05 DIAGNOSIS — Z951 Presence of aortocoronary bypass graft: Secondary | ICD-10-CM

## 2020-02-05 DIAGNOSIS — Z5189 Encounter for other specified aftercare: Secondary | ICD-10-CM

## 2020-02-05 NOTE — Progress Notes (Signed)
The patient is seen for wound check.  He has been doing well since discontinuing wound VAC therapy.  He denies any pain or fevers Physical exam: There is intense granulation tissue within the bed of the incision.  This is treated with silver nitrate.  The patient tolerated this well.  The wound is covered with a nonadhesive dressing.  Plan: Follow-up in 4 days for wound check  Cathyann Kilfoyle Z. Orvan Seen, [place]

## 2020-02-15 ENCOUNTER — Other Ambulatory Visit: Payer: Self-pay | Admitting: Physician Assistant

## 2020-03-15 ENCOUNTER — Other Ambulatory Visit: Payer: Self-pay | Admitting: Physician Assistant

## 2020-03-23 ENCOUNTER — Other Ambulatory Visit: Payer: Self-pay | Admitting: Physician Assistant

## 2020-04-01 ENCOUNTER — Other Ambulatory Visit: Payer: Self-pay | Admitting: Physician Assistant

## 2020-06-03 ENCOUNTER — Inpatient Hospital Stay (HOSPITAL_COMMUNITY)
Admission: EM | Admit: 2020-06-03 | Discharge: 2020-06-06 | DRG: 291 | Disposition: A | Discharge status: Home / Self Care | Payer: Medicaid Other | Attending: Internal Medicine | Admitting: Internal Medicine

## 2020-06-03 ENCOUNTER — Emergency Department (HOSPITAL_COMMUNITY): Payer: Medicaid Other

## 2020-06-03 ENCOUNTER — Other Ambulatory Visit: Payer: Self-pay

## 2020-06-03 DIAGNOSIS — R9431 Abnormal electrocardiogram [ECG] [EKG]: Secondary | ICD-10-CM | POA: Diagnosis present

## 2020-06-03 DIAGNOSIS — Z79899 Other long term (current) drug therapy: Secondary | ICD-10-CM

## 2020-06-03 DIAGNOSIS — M549 Dorsalgia, unspecified: Secondary | ICD-10-CM | POA: Diagnosis present

## 2020-06-03 DIAGNOSIS — Z6831 Body mass index (BMI) 31.0-31.9, adult: Secondary | ICD-10-CM

## 2020-06-03 DIAGNOSIS — I2510 Atherosclerotic heart disease of native coronary artery without angina pectoris: Secondary | ICD-10-CM | POA: Diagnosis present

## 2020-06-03 DIAGNOSIS — Z8249 Family history of ischemic heart disease and other diseases of the circulatory system: Secondary | ICD-10-CM

## 2020-06-03 DIAGNOSIS — Z888 Allergy status to other drugs, medicaments and biological substances status: Secondary | ICD-10-CM

## 2020-06-03 DIAGNOSIS — Z955 Presence of coronary angioplasty implant and graft: Secondary | ICD-10-CM

## 2020-06-03 DIAGNOSIS — Z7901 Long term (current) use of anticoagulants: Secondary | ICD-10-CM

## 2020-06-03 DIAGNOSIS — R0602 Shortness of breath: Secondary | ICD-10-CM

## 2020-06-03 DIAGNOSIS — E1151 Type 2 diabetes mellitus with diabetic peripheral angiopathy without gangrene: Secondary | ICD-10-CM | POA: Diagnosis present

## 2020-06-03 DIAGNOSIS — F419 Anxiety disorder, unspecified: Secondary | ICD-10-CM | POA: Diagnosis present

## 2020-06-03 DIAGNOSIS — E119 Type 2 diabetes mellitus without complications: Secondary | ICD-10-CM

## 2020-06-03 DIAGNOSIS — Z7902 Long term (current) use of antithrombotics/antiplatelets: Secondary | ICD-10-CM

## 2020-06-03 DIAGNOSIS — Z7984 Long term (current) use of oral hypoglycemic drugs: Secondary | ICD-10-CM

## 2020-06-03 DIAGNOSIS — Z885 Allergy status to narcotic agent status: Secondary | ICD-10-CM

## 2020-06-03 DIAGNOSIS — Z86010 Personal history of colonic polyps: Secondary | ICD-10-CM

## 2020-06-03 DIAGNOSIS — Z833 Family history of diabetes mellitus: Secondary | ICD-10-CM

## 2020-06-03 DIAGNOSIS — E785 Hyperlipidemia, unspecified: Secondary | ICD-10-CM | POA: Diagnosis present

## 2020-06-03 DIAGNOSIS — I110 Hypertensive heart disease with heart failure: Principal | ICD-10-CM | POA: Diagnosis present

## 2020-06-03 DIAGNOSIS — I5023 Acute on chronic systolic (congestive) heart failure: Secondary | ICD-10-CM

## 2020-06-03 DIAGNOSIS — E669 Obesity, unspecified: Secondary | ICD-10-CM | POA: Diagnosis present

## 2020-06-03 DIAGNOSIS — I10 Essential (primary) hypertension: Secondary | ICD-10-CM

## 2020-06-03 DIAGNOSIS — Z951 Presence of aortocoronary bypass graft: Secondary | ICD-10-CM

## 2020-06-03 DIAGNOSIS — Z87891 Personal history of nicotine dependence: Secondary | ICD-10-CM

## 2020-06-03 DIAGNOSIS — I252 Old myocardial infarction: Secondary | ICD-10-CM

## 2020-06-03 DIAGNOSIS — I480 Paroxysmal atrial fibrillation: Secondary | ICD-10-CM | POA: Diagnosis present

## 2020-06-03 DIAGNOSIS — G8929 Other chronic pain: Secondary | ICD-10-CM | POA: Diagnosis present

## 2020-06-03 DIAGNOSIS — Z20822 Contact with and (suspected) exposure to covid-19: Secondary | ICD-10-CM | POA: Diagnosis present

## 2020-06-03 DIAGNOSIS — I509 Heart failure, unspecified: Secondary | ICD-10-CM

## 2020-06-03 LAB — CBC
HCT: 48.9 % (ref 39.0–52.0)
Hemoglobin: 15 g/dL (ref 13.0–17.0)
MCH: 28 pg (ref 26.0–34.0)
MCHC: 30.7 g/dL (ref 30.0–36.0)
MCV: 91.2 fL (ref 80.0–100.0)
Platelets: 359 10*3/uL (ref 150–400)
RBC: 5.36 MIL/uL (ref 4.22–5.81)
RDW: 14.9 % (ref 11.5–15.5)
WBC: 9.3 10*3/uL (ref 4.0–10.5)
nRBC: 0 % (ref 0.0–0.2)

## 2020-06-03 LAB — TROPONIN I (HIGH SENSITIVITY): Troponin I (High Sensitivity): 58 ng/L — ABNORMAL HIGH (ref <18)

## 2020-06-03 LAB — CBC WITH DIFFERENTIAL/PLATELET
Abs Immature Granulocytes: 0.03 10*3/uL (ref 0.00–0.07)
Basophils Absolute: 0.1 10*3/uL (ref 0.0–0.1)
Basophils Relative: 1 %
Eosinophils Absolute: 0.2 10*3/uL (ref 0.0–0.5)
Eosinophils Relative: 2 %
HCT: 52 % (ref 39.0–52.0)
Hemoglobin: 16.7 g/dL (ref 13.0–17.0)
Immature Granulocytes: 0 %
Lymphocytes Relative: 23 %
Lymphs Abs: 2.2 10*3/uL (ref 0.7–4.0)
MCH: 28.8 pg (ref 26.0–34.0)
MCHC: 32.1 g/dL (ref 30.0–36.0)
MCV: 89.7 fL (ref 80.0–100.0)
Monocytes Absolute: 1 10*3/uL (ref 0.1–1.0)
Monocytes Relative: 10 %
Neutro Abs: 6.3 10*3/uL (ref 1.7–7.7)
Neutrophils Relative %: 64 %
Platelets: 401 10*3/uL — ABNORMAL HIGH (ref 150–400)
RBC: 5.8 MIL/uL (ref 4.22–5.81)
RDW: 15.1 % (ref 11.5–15.5)
WBC: 9.8 10*3/uL (ref 4.0–10.5)
nRBC: 0 % (ref 0.0–0.2)

## 2020-06-03 LAB — BASIC METABOLIC PANEL
Anion gap: 13 (ref 5–15)
BUN: 17 mg/dL (ref 6–20)
CO2: 27 mmol/L (ref 22–32)
Calcium: 8.9 mg/dL (ref 8.9–10.3)
Chloride: 102 mmol/L (ref 98–111)
Creatinine, Ser: 1.04 mg/dL (ref 0.61–1.24)
GFR, Estimated: >60 mL/min (ref >60)
Glucose, Bld: 98 mg/dL (ref 70–99)
Potassium: 4.1 mmol/L (ref 3.5–5.1)
Sodium: 142 mmol/L (ref 135–145)

## 2020-06-03 LAB — RESP PANEL BY RT-PCR (FLU A&B, COVID) ARPGX2
Influenza A by PCR: NEGATIVE
Influenza B by PCR: NEGATIVE
SARS Coronavirus 2 by RT PCR: NEGATIVE

## 2020-06-03 LAB — BRAIN NATRIURETIC PEPTIDE: B Natriuretic Peptide: 1905.8 pg/mL — ABNORMAL HIGH (ref 0.0–100.0)

## 2020-06-03 LAB — D-DIMER, QUANTITATIVE: D-Dimer, Quant: 0.92 ug/mL-FEU — ABNORMAL HIGH (ref 0.00–0.50)

## 2020-06-03 MED ORDER — IOHEXOL 350 MG/ML SOLN
100.0000 mL | Freq: Once | INTRAVENOUS | Status: AC | PRN
  Administered 2020-06-03: 100 mL via INTRAVENOUS

## 2020-06-03 MED ORDER — FUROSEMIDE 10 MG/ML IJ SOLN
40.0000 mg | Freq: Once | INTRAMUSCULAR | Status: AC
  Administered 2020-06-03: 40 mg via INTRAVENOUS
  Filled 2020-06-03: qty 4

## 2020-06-03 MED ORDER — ACETAMINOPHEN 325 MG PO TABS
650.0000 mg | ORAL_TABLET | Freq: Once | ORAL | Status: AC
  Administered 2020-06-03: 650 mg via ORAL
  Filled 2020-06-03: qty 2

## 2020-06-03 NOTE — ED Notes (Signed)
Patient transported to CT scan . 

## 2020-06-03 NOTE — ED Provider Notes (Signed)
[facility]Y DEPARTMENT Provider Note   CSN: [id number] Arrival date & time: 06/03/20  1707     History Chief Complaint  Patient presents with  . Shortness of Breath    Lee [name] is a 60 y.o. male with h/o CHF (LVEF 30%) s/p CABG (10/2019), Lee [name], Lee [name], Lee [name], Lee [name]. Gradual onset [name] over the last 3 weeks with acute worsening over the last 3 days. Associated substernal pleuritic chest pain which the patient states he has been having since his CABG last year, but now worsened with deep inspiration. He also reports orthopnea, DOE, Lee abdominal distention. Denies unilateral leg pain or BLE edema. No fever, chills, cough, hemoptysis, N/V/D, or abdominal pain. Patient states he feels similar to when he needed fluid to be taken off due to CHF exacerbation. Currently not taking any diuretic. No anticoagulation.  The history is provided by the patient Lee medical records.  Shortness of Breath Severity:  Moderate Onset quality:  Gradual Duration:  3 weeks Timing:  Constant Progression:  Worsening Chronicity:  New Context comment:  Spontaneous Relieved by:  Sitting up Worsened by:  Exertion (exertion) Ineffective treatments:  None tried Associated symptoms: chest pain, diaphoresis Lee headaches   Associated symptoms: no abdominal pain, no cough, no ear pain, no fever, no hemoptysis, no rash, no sore throat, no sputum production, no syncope, no vomiting Lee no wheezing   Risk factors: no hx of PE/DVT Lee no tobacco use        Past Medical History:  Diagnosis Date  . Anxiety   . CAD (coronary artery disease)   . Chronic back pain   . Colon polyps   . Diabetes mellitus   . Heart attack ([place])   . Hyperlipidemia   . Hypertension   . Panic attacks   . PVD (peripheral vascular disease) [facility])     Patient Active Problem List   Diagnosis Date Noted  . Sternal wound dehiscence 11/26/2019  . Sternal wound infection  11/26/2019  . Wound of sternal region 11/26/2019  . Hx of CABG 11/23/2019  . Ischemic cardiomyopathy 11/23/2019  . PAF (paroxysmal atrial fibrillation) ([place]) 11/23/2019  . PVD (peripheral vascular disease) ([place]) 11/23/2019  . Chronic back pain 11/23/2019  . Prolonged Q-T interval on ECG 11/23/2019  . Stenosis of right carotid artery   . Pleural effusion 10/26/2019  . Acute combined systolic Lee diastolic heart failure ([place]) 10/25/2019  . Acute congestive heart failure ([place])   . Superficial bacterial skin infection 06/16/2018  . Bacterial skin infection 06/16/2018  . CAD S/P percutaneous coronary angioplasty 08/29/2011  . Tobacco abuse 08/29/2011  . Hypertension 08/29/2011  . Hyperlipidemia 08/29/2011  . Diabetes mellitus ([place]) 08/29/2011    Past Surgical History:  Procedure Laterality Date  . APPLICATION OF A-CELL OF BACK N/A 12/09/2019   Procedure: APPLICATION OF A-CELL OF BACK;  Surgeon: Wonda Olds, MD;  Location: [facility];  Service: Thoracic;  Laterality: N/A;  . APPLICATION OF A-CELL OF CHEST/ABDOMEN N/A 12/04/2019   Procedure: APPLICATION OF A-CELL OF CHEST;  Surgeon: Wonda Olds, MD;  Location: [facility] OR;  Service: Thoracic;  Laterality: N/A;  . APPLICATION OF WOUND VAC N/A 11/27/2019   Procedure: APPLICATION OF WOUND VAC;  Surgeon: Ivin Poot, MD;  Location: [place];  Service: Cardiovascular;  Laterality: N/A;  Lower midline superficial sternal wound area.  . APPLICATION OF WOUND VAC N/A 12/09/2019   Procedure: WOUND VAC EXCHANGE;  Surgeon: Wonda Olds, MD;  Location: [place];  Service: Thoracic;  Laterality: N/A;  . APPLICATION OF WOUND VAC N/A 12/14/2019   Procedure: WOUND VAC CHANGE;  Surgeon: Wonda Olds, MD;  Location: [place];  Service: Thoracic;  Laterality: N/A;  . CORONARY ANGIOPLASTY WITH STENT PLACEMENT    . CORONARY ARTERY BYPASS GRAFT N/A 10/29/2019   Procedure: CORONARY ARTERY BYPASS GRAFTING (CABG) using LIMA to LAD; RIMA to PDA; Lee Left radial  harvest vein to Circ.;  Surgeon: Wonda Olds, MD;  Location: [place];  Service: Open Heart Surgery;  Laterality: N/A;  . EAR CYST EXCISION  12/10/2011   Procedure: CYST REMOVAL;  Surgeon: Gae Bon, DDS;  Location: [place];  Service: Oral Surgery;  Laterality: Left;  Left Mandible Cyst Removal  . IR THORACENTESIS ASP PLEURAL SPACE W/IMG GUIDE  10/27/2019  . MULTIPLE EXTRACTIONS WITH ALVEOLOPLASTY  12/10/2011   Procedure: MULTIPLE EXTRACION WITH ALVEOLOPLASTY;  Surgeon: Gae Bon, DDS;  Location: [place];  Service: Oral Surgery;  Laterality: N/A;  Right Maxillary Tuberosity Reduction; Right  Maxillary Buccal Exostosis; Bilateral Mandibular Tori   . PERIPHERAL VASCULAR CATHETERIZATION N/A 12/28/2014   Procedure: Abdominal Aortogram;  Surgeon: Serafina Mitchell, MD;  Location: [place] CV LAB;  Service: Cardiovascular;  Laterality: N/A;  . PERIPHERAL VASCULAR CATHETERIZATION N/A 12/20/2015   Procedure: Abdominal Aortogram;  Surgeon: Serafina Mitchell, MD;  Location: [place] CV LAB;  Service: Cardiovascular;  Laterality: N/A;  . PERIPHERAL VASCULAR CATHETERIZATION  12/20/2015   Procedure: Peripheral Vascular Balloon Angioplasty;  Surgeon: Serafina Mitchell, MD;  Location: [place] CV LAB;  Service: Cardiovascular;;  . RADIAL ARTERY HARVEST Left 10/29/2019   Procedure: RADIAL ARTERY HARVEST;  Surgeon: Wonda Olds, MD;  Location: [place];  Service: Open Heart Surgery;  Laterality: Left;  . RIGHT/LEFT HEART CATH [facility] CORONARY ANGIOGRAPHY N/A 10/28/2019   Procedure: RIGHT/LEFT HEART CATH Lee CORONARY ANGIOGRAPHY;  Surgeon: Burnell Blanks, MD;  Location: [place] CV LAB;  Service: Cardiovascular;  Laterality: N/A;  . STERNAL WOUND DEBRIDEMENT N/A 12/01/2019   Procedure: STERNAL WOUND DEBRIDEMENT [facility] WOUND VAC CHANGE;  Surgeon: Wonda Olds, MD;  Location: [place];  Service: Thoracic;  Laterality: N/A;  . STERNAL WOUND DEBRIDEMENT N/A 12/04/2019   Procedure: STERNAL WOUND DEBRIDEMENT [facility]  WOUND VAC CHANGE;  Surgeon: Wonda Olds, MD;  Location: [place];  Service: Thoracic;  Laterality: N/A;  . STERNAL WOUND DEBRIDEMENT N/A 12/09/2019   Procedure: STERNAL WOUND DEBRIDEMENT;  Surgeon: Wonda Olds, MD;  Location: [place];  Service: Thoracic;  Laterality: N/A;  . TEE WITHOUT CARDIOVERSION N/A 10/29/2019   Procedure: TRANSESOPHAGEAL ECHOCARDIOGRAM (TEE);  Surgeon: Wonda Olds, MD;  Location: [place];  Service: Open Heart Surgery;  Laterality: N/A;  . WOUND EXPLORATION N/A 11/27/2019   Procedure: EXCISIONAL DEBRIDEMENT OF SUPERFICIAL STERNAL WOUND EXPLORATION;  Surgeon: Ivin Poot, MD;  Location: [facility];  Service: Cardiovascular;  Laterality: N/A;  Superficial sternal wound       Family History  Problem Relation Age of Onset  . Diabetes Father   . Kidney failure Father   . Hypertension Father   . Hyperlipidemia Father   . Heart disease Father     Social History   Tobacco Use  . Smoking status: Former Smoker    Packs/day: 0.25    Years: 25.00    Pack years: 6.25    Types: Cigarettes  . Smokeless tobacco: Never Used  . Tobacco  comment: 1/4 pack per day  Vaping Use  . Vaping Use: Never used  Substance Use Topics  . Alcohol use: No    Alcohol/week: 0.0 standard drinks  . Drug use: No    Home Medications Prior to Admission medications   Medication Sig Start Date End Date Taking? Authorizing Provider  acetaminophen (TYLENOL) 500 MG tablet Take 1,000 mg by mouth every 6 (six) hours as needed for headache.    [provider]  amLODipine (NORVASC) 10 MG tablet Take 1 tablet (10 mg total) by mouth daily. 12/18/19   Nani Skillern, PA-C  apixaban (ELIQUIS) 5 MG TABS tablet Take 1 tablet (5 mg total) by mouth 2 (two) times daily. 11/06/19   Wonda Olds, MD  atorvastatin (LIPITOR) 40 MG tablet Take 1 tablet (40 mg total) by mouth daily. 11/07/19   Elgie Collard, PA-C  clopidogrel (PLAVIX) 75 MG tablet Take 1 tablet (75 mg total) by mouth daily.  11/07/19   Elgie Collard, PA-C  empagliflozin (JARDIANCE) 25 MG TABS tablet Take 25 mg by mouth daily.    [provider]  furosemide (LASIX) 40 MG tablet Take 1 tablet (40 mg total) by mouth daily. 11/06/19   Elgie Collard, PA-C  lidocaine (LIDODERM) 5 % Place 2 patches onto the skin every 12 (twelve) hours. Remove & Discard patch within 12 hours or as directed by MD 11/23/19 11/22/20  Wonda Olds, MD  losartan (COZAAR) 100 MG tablet Take 1 tablet (100 mg total) by mouth daily. 12/18/19   Nani Skillern, PA-C  metFORMIN (GLUCOPHAGE) 1000 MG tablet Take 1,000 mg by mouth 2 (two) times daily with a meal.     [provider]  metoprolol succinate (TOPROL-XL) 25 MG 24 hr tablet Take 1 tablet (25 mg total) by mouth daily. 12/18/19   Nani Skillern, PA-C  Multiple Vitamins-Minerals (CENTRUM SILVER) tablet Take 1 tablet by mouth daily.    [provider]  oxyCODONE (OXY IR/ROXICODONE) 5 MG immediate release tablet Take 1 tablet (5 mg total) by mouth every 4 (four) hours as needed for severe pain. Patient not taking: Reported on 02/05/2020 12/18/19   Nani Skillern, PA-C  oxyCODONE (ROXICODONE) 5 MG immediate release tablet Take 1 tablet (5 mg total) by mouth every 8 (eight) hours as needed. 12/29/19 12/28/20  Wonda Olds, MD  potassium chloride (KLOR-CON) 10 MEQ tablet Take 2 tablets (20 mEq total) by mouth daily. 11/30/19   Nani Skillern, PA-C  Skin Protectants, Misc. (EUCERIN) cream Apply topically daily. Patient taking differently: Apply 1 application topically daily as needed for dry skin.  06/19/18   Kayleen Memos, DO  traMADol (ULTRAM) 50 MG tablet TAKE 1 TABLET BY MOUTH EVERY 6 HOURS AS NEEDED FOR MODERATE PAIN 01/07/20   Wonda Olds, MD  triamcinolone cream (KENALOG) 0.1 % Apply 1 application topically 2 (two) times daily. Patient taking differently: Apply 1 application topically 2 (two) times daily as needed (rash).  04/06/17   Virgel Manifold, MD  TRULICITY 9.62 XB/2.8UX SOPN Inject 0.75 mg into the skin once a week. 05/04/19   [provider]    Allergies    Codeine, Gabapentin, Pregabalin, Simvastatin, Hydrocodone, Lisinopril, Lee Venlafaxine  Review of Systems   Review of Systems  Constitutional: Positive for diaphoresis. Negative for chills Lee fever.  HENT: Negative for ear pain Lee sore throat.   Eyes: Negative for pain Lee visual disturbance.  Respiratory: Positive for shortness of breath.  Negative for cough, hemoptysis, sputum production Lee wheezing.   Cardiovascular: Positive for chest pain. Negative for palpitations Lee syncope.  Gastrointestinal: Positive for abdominal distention. Negative for abdominal pain Lee vomiting.  Genitourinary: Negative for dysuria Lee hematuria.  Musculoskeletal: Negative for arthralgias Lee back pain.  Skin: Negative for color change Lee rash.  Neurological: Positive for headaches. Negative for seizures Lee syncope.  All other systems reviewed Lee are negative.   Physical Exam Updated Vital Signs BP (!) 162/107 (BP Location: Left Arm)   Pulse 84   Temp 98.2 F (36.8 C) (Oral)   Resp 16   SpO2 99%   Physical Exam Vitals Lee nursing note reviewed.  Constitutional:      General: He is not in acute distress.    Appearance: He is well-developed Lee well-nourished. He is not ill-appearing or toxic-appearing.  HENT:     Head: Normocephalic Lee atraumatic.     Right Ear: External ear normal.     Left Ear: External ear normal.     Nose: Nose normal.     Mouth/Throat:     Mouth: Mucous membranes are moist.     Pharynx: Oropharynx is clear. No oropharyngeal exudate or posterior oropharyngeal erythema.  Eyes:     General: No scleral icterus.       Right eye: No discharge.        Left eye: No discharge.     Conjunctiva/sclera: Conjunctivae normal.  Neck:     Vascular: No JVD.  Cardiovascular:     Rate Lee Rhythm: Normal rate Lee regular rhythm.     Heart  sounds: No murmur heard.   Pulmonary:     Effort: Pulmonary effort is normal. Tachypnea present. No respiratory distress.     Breath sounds: Normal breath sounds. No wheezing, rhonchi or rales.     Comments: Sternal tenderness without crepitus, deformity, or instability. Large midline sternal scar. Abdominal:     General: Abdomen is flat. There is distension.     Palpations: Abdomen is soft.     Tenderness: There is no abdominal tenderness. There is no guarding or rebound.  Musculoskeletal:        General: No edema.     Cervical back: Neck supple.     Right lower leg: No edema.     Left lower leg: No edema.  Skin:    General: Skin is warm Lee dry.     Findings: No rash.  Neurological:     General: No focal deficit present.     Mental Status: He is alert Lee oriented to person, place, Lee time.     Motor: No weakness.  Psychiatric:        Mood Lee Affect: Mood Lee affect Lee mood normal.        Behavior: Behavior normal.     ED Results / Procedures / Treatments   Labs (all labs ordered are listed, but only abnormal results are displayed) Labs Reviewed  BRAIN NATRIURETIC PEPTIDE - Abnormal; Notable for the following components:      Result Value   B Natriuretic Peptide 1,905.8 (*)    All other components within normal limits  D-DIMER, QUANTITATIVE (NOT AT [facility]) - Abnormal; Notable for the following components:   D-Dimer, Quant 0.92 (*)    All other components within normal limits  CBC WITH DIFFERENTIAL/PLATELET - Abnormal; Notable for the following components:   Platelets 401 (*)    All other components within normal limits  TROPONIN I (HIGH SENSITIVITY) -  Abnormal; Notable for the following components:   Troponin I (High Sensitivity) 58 (*)    All other components within normal limits  TROPONIN I (HIGH SENSITIVITY) - Abnormal; Notable for the following components:   Troponin I (High Sensitivity) 51 (*)    All other components within normal limits  RESP PANEL BY RT-PCR  (FLU A&B, COVID) ARPGX2  BASIC METABOLIC PANEL  CBC    EKG EKG Interpretation  Date/Time:  Friday June 03 2020 17:31:51 EST Ventricular Rate:  80 PR Interval:  194 QRS Duration: 98 QT Interval:  438 QTC Calculation: 505 R Axis:   144 Text Interpretation: Normal sinus rhythm Anterolateral infarct , age undetermined Abnormal ECG since last tracing no significant change Confirmed by Noemi Chapel [phone]) on 06/03/2020 8:46:51 PM   Radiology DG Chest 2 View  Result Date: 06/03/2020 CLINICAL DATA:  Shortness of breath. EXAM: CHEST - 2 VIEW COMPARISON:  11/26/2019 FINDINGS: The patient is status post prior CABG. The main pulmonary arteries are dilated as before. There are aortic calcifications. The heart size is enlarged. There are findings of mild interstitial edema with small bilateral pleural effusions. There is atelectasis at the lung bases. IMPRESSION: Cardiomegaly with mild interstitial edema Lee small bilateral pleural effusions. Electronically Signed   By: Constance Holster M.D.   On: 06/03/2020 18:04   CT Angio Chest PE W Lee/or Wo Contrast  Result Date: 06/04/2020 CLINICAL DATA:  Positive D-dimer.  Shortness of breath. EXAM: CT ANGIOGRAPHY CHEST WITH CONTRAST TECHNIQUE: Multidetector CT imaging of the chest was performed using the standard protocol during bolus administration of intravenous contrast. Multiplanar CT image reconstructions Lee MIPs were obtained to evaluate the vascular anatomy. CONTRAST:  154mL OMNIPAQUE IOHEXOL 350 MG/ML SOLN COMPARISON:  Chest radiography earlier same day. Chest CT 11/26/2019. FINDINGS: Cardiovascular: Previous median sternotomy Lee CABG. Cardiomegaly. Some calcification of the anterior wall the left ventricle. Thoracic aortic atherosclerotic calcification. Pulmonary arterial opacification is centrally Lee in the upper lobes. There is diminished opacification of the lower lobe pulmonary arteries that I favor is due to bolus timing. Lower lobe  pulmonary arterial branches are not opacified, but this is bilateral Lee symmetric Lee there does not appear to be a true filling defect, therefore I do not think that this appearance is due to embolic disease based on any positive finding. Of course there could be a small embolus in the unopacified vessels. All opacified vessels are free of emboli. Mediastinum/Nodes: No mass or adenopathy. Changes related to previous CABG. Sternal nonunion. Lungs/Pleura: Bilateral pleural effusions layering dependently. Dependent pulmonary atelectasis, most pronounced in the lower lobes. Mild interstitial edema pattern. Upper Abdomen: No acute finding. No significant upper abdominal pathology. Musculoskeletal: Minimal thoracic degenerative changes. Sternal nonunion as noted above. Review of the MIP images confirms the above findings. IMPRESSION: 1. Cardiomegaly. Previous median sternotomy Lee CABG. Some calcification of the anterior wall of the left ventricle. 2. Bilateral pleural effusions layering dependently. Dependent pulmonary atelectasis, most pronounced in the lower lobes. Mild interstitial edema pattern. Findings are consistent with congestive heart failure. 3. Lower lobe pulmonary arterial opacification is poor, but I favor that this is due to bolus timing. I do not think this appearance is due to embolic disease based on any positive finding. Of course there could be a small embolus in the unopacified vessels of the lower lobes. There is no positive sign of embolic disease in any opacified vessel. 4. Aortic atherosclerosis. Aortic Atherosclerosis (ICD10-I70.0). Electronically Signed   By: Jan Fireman.D.  On: 06/04/2020 00:09    Procedures Procedures   Medications Ordered in ED Medications  acetaminophen (TYLENOL) tablet 650 mg (650 mg Oral Given 06/03/20 2152)  furosemide (LASIX) injection 40 mg (40 mg Intravenous Given 06/03/20 2153)  iohexol (OMNIPAQUE) 350 MG/ML injection 100 mL (100 mLs Intravenous  Contrast Given 06/03/20 2340)    ED Course  I have reviewed the triage vital signs Lee the nursing notes.  Pertinent labs & imaging results that were available during my care of the patient were reviewed by me Lee considered in my medical decision making (see chart for details).    MDM Rules/Calculators/A&P                          Patient is a 87yoM w/ history Lee physical as described above who presents to the ED for [name] Lee pleuritic substernal chest pain. Patient tachypneic with RR upper 20s, satting 94% on RA, Lee otherwise hemodynamically stable. Initial diagnostic workup significant for BNP 1905, initial troponin 58, Lee D-dimer 0.92. Will obtain CTA chest for PE evaluation Lee trend troponin. Initial treatment includes IV Lasix for suspected CHF exacerbation as well as Tylenol for headache. Differential diagnosis includes acute CHF exacerbation, PE, ACS, or COVID PNA. CXR reassuring for focal PNA Lee PTX. Doubt COPD exacerbation given no previous history Lee no wheezing, productive cough, or prolonged expiratory phase on exam. Presentation not consistent with anaphylaxis.  On reassessment, patient states he has urinated three times following the Lasix Lee states his breathing Lee abdominal distention have improved. Given positive response to diuresis as well as elevated BNP, known reduced LVEF, Lee pulmonary edema on CXR, suspect presentation likely 2/2 CHF exacerbation however CTA chest still pending to rule out PE given persistent pleuritic chest pain. At time of sign out, CTA chest Lee delta troponin still pending. Anticipate admission for further diuresis Lee evaluation. No acute events under my care. Care transferred to [facility] PA-C at 2300. Please refer to her note for further patient events Lee MDM.  Final Clinical Impression(s) / ED Diagnoses Final diagnoses:  Acute on chronic congestive heart failure, unspecified heart failure type [facility])  Shortness of breath    Rx /  DC Orders ED Discharge Orders    None       Christy Gentles, MD 06/04/20 [id number]    Noemi Chapel, MD 06/06/20 [phone]

## 2020-06-03 NOTE — ED Provider Notes (Signed)
23:30: Assumed care of patient at change of shift pending CT angio of the chest and consult for admission.  Please see prior provider note for full H&P, briefly patient is a 60 year old male with a history of CHF with last EF 30% status post CABG in 2021, diabetes, hypertension, and hyperlipidemia who presented to the ED for shortness of breath that has been gradually worsening over the past 3 weeks which acutely worsened over the past 3 days.  Has had some pleuritic chest discomfort as well.  Currently not taking a diuretic.  States this feels like when he has had to be admitted to the hospital previously.  Work-up has been reviewed, notable for elevated BNP, mild elevated troponin, elevated D-dimer. CT angio without definitive evidence of pulmonary embolism, does have findings of CHF.    On reassessment patient is saturating in the upper 90s on 4 L nasal cannula, I turned this down to 3 L and he did drop down to 91%, placed back on 4 L.  He has started to diurese well.  He continues to state this feels like when he has had to be admitted previously.  Will discuss with hospital service for admission at this time.  01:10: CONSULT: Discussed with hospitalist Dr. Tonie Griffith who accepts admission.    Leafy Kindle 06/04/20 Carloyn Manner, MD 06/04/20 [phone]

## 2020-06-03 NOTE — ED Triage Notes (Signed)
Pt presents to ED POV. Pt c/o SOB, abd distention, and orthopnea for a few days. Pt tachypnec in triage. Pt states that in July he was retaining a lot of fluid and had CABG.

## 2020-06-04 ENCOUNTER — Encounter (HOSPITAL_COMMUNITY): Payer: Self-pay | Admitting: Family Medicine

## 2020-06-04 ENCOUNTER — Other Ambulatory Visit (HOSPITAL_COMMUNITY): Payer: Medicaid Other

## 2020-06-04 ENCOUNTER — Inpatient Hospital Stay (HOSPITAL_COMMUNITY): Payer: Medicaid Other

## 2020-06-04 DIAGNOSIS — I252 Old myocardial infarction: Secondary | ICD-10-CM | POA: Diagnosis not present

## 2020-06-04 DIAGNOSIS — E119 Type 2 diabetes mellitus without complications: Secondary | ICD-10-CM | POA: Diagnosis not present

## 2020-06-04 DIAGNOSIS — Z955 Presence of coronary angioplasty implant and graft: Secondary | ICD-10-CM | POA: Diagnosis not present

## 2020-06-04 DIAGNOSIS — M549 Dorsalgia, unspecified: Secondary | ICD-10-CM | POA: Diagnosis present

## 2020-06-04 DIAGNOSIS — I10 Essential (primary) hypertension: Secondary | ICD-10-CM

## 2020-06-04 DIAGNOSIS — E669 Obesity, unspecified: Secondary | ICD-10-CM | POA: Diagnosis present

## 2020-06-04 DIAGNOSIS — E1151 Type 2 diabetes mellitus with diabetic peripheral angiopathy without gangrene: Secondary | ICD-10-CM | POA: Diagnosis present

## 2020-06-04 DIAGNOSIS — I480 Paroxysmal atrial fibrillation: Secondary | ICD-10-CM | POA: Diagnosis present

## 2020-06-04 DIAGNOSIS — Z7901 Long term (current) use of anticoagulants: Secondary | ICD-10-CM | POA: Diagnosis not present

## 2020-06-04 DIAGNOSIS — Z951 Presence of aortocoronary bypass graft: Secondary | ICD-10-CM | POA: Diagnosis not present

## 2020-06-04 DIAGNOSIS — Z20822 Contact with and (suspected) exposure to covid-19: Secondary | ICD-10-CM | POA: Diagnosis present

## 2020-06-04 DIAGNOSIS — G8929 Other chronic pain: Secondary | ICD-10-CM | POA: Diagnosis present

## 2020-06-04 DIAGNOSIS — I2510 Atherosclerotic heart disease of native coronary artery without angina pectoris: Secondary | ICD-10-CM | POA: Diagnosis present

## 2020-06-04 DIAGNOSIS — Z7984 Long term (current) use of oral hypoglycemic drugs: Secondary | ICD-10-CM | POA: Diagnosis not present

## 2020-06-04 DIAGNOSIS — I5021 Acute systolic (congestive) heart failure: Secondary | ICD-10-CM

## 2020-06-04 DIAGNOSIS — Z833 Family history of diabetes mellitus: Secondary | ICD-10-CM | POA: Diagnosis not present

## 2020-06-04 DIAGNOSIS — Z7902 Long term (current) use of antithrombotics/antiplatelets: Secondary | ICD-10-CM | POA: Diagnosis not present

## 2020-06-04 DIAGNOSIS — E785 Hyperlipidemia, unspecified: Secondary | ICD-10-CM | POA: Diagnosis present

## 2020-06-04 DIAGNOSIS — I110 Hypertensive heart disease with heart failure: Secondary | ICD-10-CM | POA: Diagnosis present

## 2020-06-04 DIAGNOSIS — Z8249 Family history of ischemic heart disease and other diseases of the circulatory system: Secondary | ICD-10-CM | POA: Diagnosis not present

## 2020-06-04 DIAGNOSIS — Z888 Allergy status to other drugs, medicaments and biological substances status: Secondary | ICD-10-CM | POA: Diagnosis not present

## 2020-06-04 DIAGNOSIS — I5023 Acute on chronic systolic (congestive) heart failure: Secondary | ICD-10-CM | POA: Diagnosis present

## 2020-06-04 DIAGNOSIS — Z79899 Other long term (current) drug therapy: Secondary | ICD-10-CM | POA: Diagnosis not present

## 2020-06-04 DIAGNOSIS — Z87891 Personal history of nicotine dependence: Secondary | ICD-10-CM | POA: Diagnosis not present

## 2020-06-04 DIAGNOSIS — I509 Heart failure, unspecified: Secondary | ICD-10-CM

## 2020-06-04 DIAGNOSIS — R0602 Shortness of breath: Secondary | ICD-10-CM | POA: Diagnosis present

## 2020-06-04 DIAGNOSIS — F419 Anxiety disorder, unspecified: Secondary | ICD-10-CM | POA: Diagnosis present

## 2020-06-04 DIAGNOSIS — Z885 Allergy status to narcotic agent status: Secondary | ICD-10-CM | POA: Diagnosis not present

## 2020-06-04 DIAGNOSIS — Z86010 Personal history of colonic polyps: Secondary | ICD-10-CM | POA: Diagnosis not present

## 2020-06-04 LAB — ECHOCARDIOGRAM COMPLETE
Area-P 1/2: 5.54 cm2
Calc EF: 36.3 %
Height: 67 in
S' Lateral: 4.2 cm
Single Plane A2C EF: 37.1 %
Single Plane A4C EF: 34.2 %
Weight: 2960 oz

## 2020-06-04 LAB — TROPONIN I (HIGH SENSITIVITY)
Troponin I (High Sensitivity): 51 ng/L — ABNORMAL HIGH (ref <18)
Troponin I (High Sensitivity): 57 ng/L — ABNORMAL HIGH (ref <18)
Troponin I (High Sensitivity): 57 ng/L — ABNORMAL HIGH (ref <18)

## 2020-06-04 LAB — GLUCOSE, CAPILLARY: Glucose-Capillary: 161 mg/dL — ABNORMAL HIGH (ref 70–99)

## 2020-06-04 LAB — HEMOGLOBIN A1C
Hgb A1c MFr Bld: 8 % — ABNORMAL HIGH (ref 4.8–5.6)
Mean Plasma Glucose: 182.9 mg/dL

## 2020-06-04 MED ORDER — SODIUM CHLORIDE 0.9 % IV SOLN: 250.0000 mL | INTRAVENOUS | Status: DC | PRN

## 2020-06-04 MED ORDER — SODIUM CHLORIDE 0.9% FLUSH: 3.0000 mL | INTRAVENOUS | Status: DC | PRN

## 2020-06-04 MED ORDER — METOPROLOL SUCCINATE ER 25 MG PO TB24
25.0000 mg | ORAL_TABLET | Freq: Every day | ORAL | Status: DC
  Administered 2020-06-04 – 2020-06-06 (×3): 25 mg via ORAL
  Filled 2020-06-04 (×3): qty 1

## 2020-06-04 MED ORDER — INSULIN ASPART 100 UNIT/ML ~~LOC~~ SOLN
0.0000 [IU] | Freq: Three times a day (TID) | SUBCUTANEOUS | Status: DC
  Administered 2020-06-05 – 2020-06-06 (×3): 2 [IU] via SUBCUTANEOUS

## 2020-06-04 MED ORDER — INSULIN ASPART 100 UNIT/ML ~~LOC~~ SOLN: 0.0000 [IU] | Freq: Every day | SUBCUTANEOUS | Status: DC

## 2020-06-04 MED ORDER — LOSARTAN POTASSIUM 50 MG PO TABS
100.0000 mg | ORAL_TABLET | Freq: Every day | ORAL | Status: DC
  Administered 2020-06-04 – 2020-06-06 (×3): 100 mg via ORAL
  Filled 2020-06-04 (×3): qty 2

## 2020-06-04 MED ORDER — ACETAMINOPHEN 325 MG PO TABS
650.0000 mg | ORAL_TABLET | ORAL | Status: DC | PRN
  Administered 2020-06-04 – 2020-06-05 (×3): 650 mg via ORAL
  Filled 2020-06-04 (×4): qty 2

## 2020-06-04 MED ORDER — ATORVASTATIN CALCIUM 40 MG PO TABS
40.0000 mg | ORAL_TABLET | Freq: Every day | ORAL | Status: DC
  Administered 2020-06-04 – 2020-06-06 (×3): 40 mg via ORAL
  Filled 2020-06-04: qty 1
  Filled 2020-06-04: qty 4
  Filled 2020-06-04: qty 1

## 2020-06-04 MED ORDER — FUROSEMIDE 10 MG/ML IJ SOLN
40.0000 mg | Freq: Two times a day (BID) | INTRAMUSCULAR | Status: AC
  Administered 2020-06-04 – 2020-06-05 (×3): 40 mg via INTRAVENOUS
  Filled 2020-06-04 (×3): qty 4

## 2020-06-04 MED ORDER — EMPAGLIFLOZIN 25 MG PO TABS
25.0000 mg | ORAL_TABLET | Freq: Every day | ORAL | Status: DC
  Administered 2020-06-04 – 2020-06-06 (×3): 25 mg via ORAL
  Filled 2020-06-04 (×3): qty 1

## 2020-06-04 MED ORDER — SODIUM CHLORIDE 0.9% FLUSH
3.0000 mL | Freq: Two times a day (BID) | INTRAVENOUS | Status: DC
  Administered 2020-06-04 – 2020-06-06 (×5): 3 mL via INTRAVENOUS

## 2020-06-04 MED ORDER — AMLODIPINE BESYLATE 10 MG PO TABS
10.0000 mg | ORAL_TABLET | Freq: Every day | ORAL | Status: DC
  Administered 2020-06-04 – 2020-06-06 (×3): 10 mg via ORAL
  Filled 2020-06-04 (×2): qty 1
  Filled 2020-06-04: qty 2

## 2020-06-04 MED ORDER — POTASSIUM CHLORIDE CRYS ER 10 MEQ PO TBCR
10.0000 meq | EXTENDED_RELEASE_TABLET | Freq: Every day | ORAL | Status: DC
  Administered 2020-06-04 – 2020-06-06 (×3): 10 meq via ORAL
  Filled 2020-06-04 (×3): qty 1

## 2020-06-04 MED ORDER — APIXABAN 5 MG PO TABS: 5.0000 mg | ORAL_TABLET | Freq: Two times a day (BID) | ORAL | Status: DC

## 2020-06-04 NOTE — Progress Notes (Signed)
PROGRESS NOTE    [name]  ZOX:[id number] DOB: 20-Nov-1960 DOA: 06/03/2020 PCP: Concepcion Elk, MD    Brief Narrative:  60 y.o. male with medical history significant for CHF with last EF 30% status post CABG in 2021, diabetes, hypertension, and hyperlipidemia who presented to the ED for shortness of breath that has been gradually worsening over the past few weeks. States that symptoms worsened in the past 3 days. Has had some pleuritic chest discomfort with deep inspiration but he has had this since having CABG last summer he states. He reports feeling smothered if he lays flat for the past three nights which is not normal for hiim. Currently not taking a diuretic. States this feels like when he has had to be admitted to the hospital previously. He uses oxygen intermittently at home but has needed to use more in past few days.  Work-up has been reviewed, notable for elevated BNP, mild elevated troponin, elevated D-dimer.CT angio without definitive evidence of pulmonary embolism but does have findings of CHF.  He was on 4 L of oxygen satting at 98%.  ER provider did decrease oxygen to 3 L and he desatted to 91% and had to be turned back up to 4 L.  He was given Lasix and has been diuresing well in the emergency room.  Assessment & Plan:   Principal Problem:   Acute on chronic HFrEF (heart failure with reduced ejection fraction) (HCC) Active Problems:   Hx of CABG   PAF (paroxysmal atrial fibrillation) (HCC)   Prolonged Q-T interval on ECG   Essential hypertension   Diabetes mellitus type 2 in nonobese [employer])  Principal Problem:   Acute on chronic HFrEF (heart failure with reduced ejection fraction)  -Presenting with elevated BNP of 1905 -Marked improvement already with IV lasix given in ED -Still clinically volume overloaded with BLE edema on exam -Cont scheduled IV lasix as tolerated -Repeat 2d echo reviewed, findings of EF of 25-30%, unchanged from last echo from 8/21 -Follow  I/o and daily wts -repeat bmet in AM -Of note, pharmacy reports that pt has been without his medication for a large part of last year. Would benefit from [facility] eval  Active Problems:   Essential hypertension -Cont home medications of Norvasc, losartan and metoprolol. -BP trends are stable    Diabetes mellitus type 2 in nonobese  -Pt was continued on Jardiance on presentation -continue on SSI coverage -Check hemoglobin A1c, pending    Hx of CABG -Stable at this time -Cont current regimen as tolerated    PAF (paroxysmal atrial fibrillation) -Patient is on Eliquis which will be continued -No evidence of acute blood loss    Prolonged Q-T interval on ECG Avoid medications which could further prolong QT interval   DVT prophylaxis: Eliquis Code Status: Full Family Communication: Pt in room, family not at bedside  Status is: Inpatient  Remains inpatient appropriate because:IV treatments appropriate due to intensity of illness or inability to take PO and Inpatient level of care appropriate due to severity of illness   Dispo: The patient is from: Home              Anticipated d/c is to: Home              Anticipated d/c date is: 3 days              Patient currently is not medically stable to d/c.   Difficult to place patient No       Consultants:  Procedures:     Antimicrobials: Anti-infectives (From admission, onward)   None       Subjective: Reports breathing better since receiving lasix  Objective: Vitals:   06/04/20 1230 06/04/20 1300 06/04/20 1400 06/04/20 1438  BP: (!) 148/96 (!) 155/104  125/78  Pulse: 74 78  61  Resp:  17  20  Temp:    97.8 F (36.6 C)  TempSrc:    Oral  SpO2: 95% 94%  97%  Weight:   91.1 kg   Height:   5\' 7"  (1.702 m)     Intake/Output Summary (Last 24 hours) at 06/04/2020 1657 Last data filed at 06/04/2020 2774 Gross per 24 hour  Intake 3 ml  Output 2000 ml  Net -1997 ml   Filed Weights   06/04/20 0742  06/04/20 1400  Weight: 83.9 kg 91.1 kg    Examination:  General exam: Appears calm and comfortable  Respiratory system: Clear to auscultation. Respiratory effort normal. Cardiovascular system: S1 & S2 heard, Regular Gastrointestinal system: Abdomen is nondistended, soft and nontender. No organomegaly or masses felt. Normal bowel sounds heard. Central nervous system: Alert and oriented. No focal neurological deficits. Extremities: Symmetric 5 x 5 power, BLE edema Skin: No rashes, lesions Psychiatry: Judgement and insight appear normal. Mood & affect appropriate.   Data Reviewed: I have personally reviewed following labs and imaging studies  CBC: Recent Labs  Lab 06/03/20 1729 06/03/20 2200  WBC 9.3 9.8  NEUTROABS  --  6.3  HGB 15.0 16.7  HCT 48.9 52.0  MCV 91.2 89.7  PLT 359 128*   Basic Metabolic Panel: Recent Labs  Lab 06/03/20 1729  NA 142  K 4.1  CL 102  CO2 27  GLUCOSE 98  BUN 17  CREATININE 1.04  CALCIUM 8.9   GFR: Estimated Creatinine Clearance: 83.3 mL/min (by C-G formula based on SCr of 1.04 mg/dL). Liver Function Tests: No results for input(s): AST, ALT, ALKPHOS, BILITOT, PROT, ALBUMIN in the last 168 hours. No results for input(s): LIPASE, AMYLASE in the last 168 hours. No results for input(s): AMMONIA in the last 168 hours. Coagulation Profile: No results for input(s): INR, PROTIME in the last 168 hours. Cardiac Enzymes: No results for input(s): CKTOTAL, CKMB, CKMBINDEX, TROPONINI in the last 168 hours. BNP (last 3 results) No results for input(s): PROBNP in the last 8760 hours. HbA1C: No results for input(s): HGBA1C in the last 72 hours. CBG: No results for input(s): GLUCAP in the last 168 hours. Lipid Profile: No results for input(s): CHOL, HDL, LDLCALC, TRIG, CHOLHDL, LDLDIRECT in the last 72 hours. Thyroid Function Tests: No results for input(s): TSH, T4TOTAL, FREET4, T3FREE, THYROIDAB in the last 72 hours. Anemia Panel: No results for  input(s): VITAMINB12, FOLATE, FERRITIN, TIBC, IRON, RETICCTPCT in the last 72 hours. Sepsis Labs: No results for input(s): PROCALCITON, LATICACIDVEN in the last 168 hours.  Recent Results (from the past 240 hour(s))  Resp Panel by RT-PCR (Flu A&B, Covid) Nasopharyngeal Swab     Status: None   Collection Time: 06/03/20  9:11 PM   Specimen: Nasopharyngeal Swab; Nasopharyngeal(NP) swabs in vial transport medium  Result Value Ref Range Status   SARS Coronavirus 2 by RT PCR NEGATIVE NEGATIVE Final    Comment: (NOTE) SARS-CoV-2 target nucleic acids are NOT DETECTED.  The SARS-CoV-2 RNA is generally detectable in upper respiratory specimens during the acute phase of infection. The lowest concentration of SARS-CoV-2 viral copies this assay can detect is 138 copies/mL. A negative result does not preclude  SARS-Cov-2 infection and should not be used as the sole basis for treatment or other patient management decisions. A negative result may occur with  improper specimen collection/handling, submission of specimen other than nasopharyngeal swab, presence of viral mutation(s) within the areas targeted by this assay, and inadequate number of viral copies(<138 copies/mL). A negative result must be combined with clinical observations, patient history, and epidemiological information. The expected result is Negative.  Fact Sheet for Patients:  EntrepreneurPulse.com.au  Fact Sheet for Healthcare Providers:  IncredibleEmployment.be  This test is no t yet approved or cleared by the [place] FDA and  has been authorized for detection and/or diagnosis of SARS-CoV-2 by FDA under an Emergency Use Authorization (EUA). This EUA will remain  in effect (meaning this test can be used) for the duration of the COVID-19 declaration under Section 564(b)(1) of the Act, 21 U.S.C.section 360bbb-3(b)(1), unless the authorization is terminated  or revoked sooner.        Influenza A by PCR NEGATIVE NEGATIVE Final   Influenza B by PCR NEGATIVE NEGATIVE Final    Comment: (NOTE) The Xpert Xpress SARS-CoV-2/FLU/RSV plus assay is intended as an aid in the diagnosis of influenza from Nasopharyngeal swab specimens and should not be used as a sole basis for treatment. Nasal washings and aspirates are unacceptable for Xpert Xpress SARS-CoV-2/FLU/RSV testing.  Fact Sheet for Patients: EntrepreneurPulse.com.au  Fact Sheet for Healthcare Providers: IncredibleEmployment.be  This test is not yet approved or cleared by the [place] FDA and has been authorized for detection and/or diagnosis of SARS-CoV-2 by FDA under an Emergency Use Authorization (EUA). This EUA will remain in effect (meaning this test can be used) for the duration of the COVID-19 declaration under Section 564(b)(1) of the Act, 21 U.S.C. section 360bbb-3(b)(1), unless the authorization is terminated or revoked.  Performed at [facility], [place] [address] [place], [place] [address]      Radiology Studies: DG Chest 2 View  Result Date: 06/03/2020 CLINICAL DATA:  Shortness of breath. EXAM: CHEST - 2 VIEW COMPARISON:  11/26/2019 FINDINGS: The patient is status post prior CABG. The main pulmonary arteries are dilated as before. There are aortic calcifications. The heart size is enlarged. There are findings of mild interstitial edema with small bilateral pleural effusions. There is atelectasis at the lung bases. IMPRESSION: Cardiomegaly with mild interstitial edema and small bilateral pleural effusions. Electronically Signed   By: Constance Holster M.D.   On: 06/03/2020 18:04   CT Angio Chest PE W and/or Wo Contrast  Result Date: 06/04/2020 CLINICAL DATA:  Positive D-dimer.  Shortness of breath. EXAM: CT ANGIOGRAPHY CHEST WITH CONTRAST TECHNIQUE: Multidetector CT imaging of the chest was performed using the standard protocol during bolus administration of  intravenous contrast. Multiplanar CT image reconstructions and MIPs were obtained to evaluate the vascular anatomy. CONTRAST:  124mL OMNIPAQUE IOHEXOL 350 MG/ML SOLN COMPARISON:  Chest radiography earlier same day. Chest CT 11/26/2019. FINDINGS: Cardiovascular: Previous median sternotomy and CABG. Cardiomegaly. Some calcification of the anterior wall the left ventricle. Thoracic aortic atherosclerotic calcification. Pulmonary arterial opacification is centrally and in the upper lobes. There is diminished opacification of the lower lobe pulmonary arteries that I favor is due to bolus timing. Lower lobe pulmonary arterial branches are not opacified, but this is bilateral and symmetric and there does not appear to be a true filling defect, therefore I do not think that this appearance is due to embolic disease based on any positive finding. Of course there could be a  small embolus in the unopacified vessels. All opacified vessels are free of emboli. Mediastinum/Nodes: No mass or adenopathy. Changes related to previous CABG. Sternal nonunion. Lungs/Pleura: Bilateral pleural effusions layering dependently. Dependent pulmonary atelectasis, most pronounced in the lower lobes. Mild interstitial edema pattern. Upper Abdomen: No acute finding. No significant upper abdominal pathology. Musculoskeletal: Minimal thoracic degenerative changes. Sternal nonunion as noted above. Review of the MIP images confirms the above findings. IMPRESSION: 1. Cardiomegaly. Previous median sternotomy and CABG. Some calcification of the anterior wall of the left ventricle. 2. Bilateral pleural effusions layering dependently. Dependent pulmonary atelectasis, most pronounced in the lower lobes. Mild interstitial edema pattern. Findings are consistent with congestive heart failure. 3. Lower lobe pulmonary arterial opacification is poor, but I favor that this is due to bolus timing. I do not think this appearance is due to embolic disease based on any  positive finding. Of course there could be a small embolus in the unopacified vessels of the lower lobes. There is no positive sign of embolic disease in any opacified vessel. 4. Aortic atherosclerosis. Aortic Atherosclerosis (ICD10-I70.0). Electronically Signed   By: Nelson Chimes M.D.   On: 06/04/2020 00:09   ECHOCARDIOGRAM COMPLETE  Result Date: 06/04/2020    ECHOCARDIOGRAM REPORT   Patient Name:   DIALLO PONDER Pae Date of Exam: 06/04/2020 Medical Rec #:  [id number]         Height:       67.0 in Accession #:    [id number]        Weight:       185.0 lb Date of Birth:  [birth year]         BSA:          1.956 m Patient Age:    41 years          BP:           153/95 mmHg Patient Gender: M                 HR:           61 bpm. Exam Location:  Inpatient Procedure: 2D Echo Indications:    acute systolic chf  History:        Patient has prior history of Echocardiogram examinations, most                 recent 12/01/2019. Prior CABG, Signs/Symptoms:Shortness of                 Breath; Risk Factors:Hypertension, Diabetes and Dyslipidemia.  Sonographer:    Johny Chess Referring Phys: [id number] [place]  1. Left ventricular ejection fraction, by estimation, is 25 to 30%. The left ventricle has severely decreased function. The left ventricle demonstrates regional wall motion abnormalities (see scoring diagram/findings for description). There is mild concentric left ventricular hypertrophy. Left ventricular diastolic parameters are consistent with Grade III diastolic dysfunction (restrictive).  2. Right ventricular systolic function is moderately reduced. The right ventricular size is normal. There is normal pulmonary artery systolic pressure. The estimated right ventricular systolic pressure is 83.4 mmHg.  3. Left atrial size was severely dilated.  4. The mitral valve is normal in structure. No evidence of mitral valve regurgitation. No evidence of mitral stenosis.  5. Tricuspid regurgitation is  eccentric; mild but may be underestimated in this study.  6. The aortic valve is grossly normal. Aortic valve regurgitation is not visualized.  7. The inferior vena cava is normal in size with greater than 50% respiratory variability,  suggesting right atrial pressure of 3 mmHg. Comparison(s): A prior study was performed on 12/01/19. Prior images reviewed side by side. Pleural effusion has resolved, right ventricular function is worse, left ventricular function is similar. FINDINGS  Left Ventricle: There is no LV thrombus. Left ventricular ejection fraction, by estimation, is 25 to 30%. The left ventricle has severely decreased function. The left ventricle demonstrates regional wall motion abnormalities. The left ventricular internal cavity size was normal in size. There is mild concentric left ventricular hypertrophy. Left ventricular diastolic parameters are consistent with Grade III diastolic dysfunction (restrictive).  LV Wall Scoring: The mid and distal anterior wall, entire apex, mid and distal inferior wall, mid anterolateral segment, and mid inferoseptal segment are hypokinetic. Right Ventricle: The right ventricular size is normal. No increase in right ventricular wall thickness. Right ventricular systolic function is moderately reduced. There is normal pulmonary artery systolic pressure. The tricuspid regurgitant velocity is 2.62 m/s, and with an assumed right atrial pressure of 3 mmHg, the estimated right ventricular systolic pressure is 82.8 mmHg. Left Atrium: Left atrial size was severely dilated. Right Atrium: Right atrial size was normal in size. Pericardium: There is no evidence of pericardial effusion. Mitral Valve: The mitral valve is normal in structure. No evidence of mitral valve regurgitation. No evidence of mitral valve stenosis. Tricuspid Valve: Tricuspid regurgitation is eccentric; mild but may be underestimated in this study. The tricuspid valve is not well visualized. Tricuspid valve  regurgitation is mild. Aortic Valve: The aortic valve is grossly normal. There is mild aortic valve annular calcification. Aortic valve regurgitation is not visualized. Pulmonic Valve: The pulmonic valve was grossly normal. Pulmonic valve regurgitation is not visualized. Aorta: The aortic root, ascending aorta and aortic arch are all structurally normal, with no evidence of dilitation or obstruction. Venous: The inferior vena cava is normal in size with greater than 50% respiratory variability, suggesting right atrial pressure of 3 mmHg. IAS/Shunts: The atrial septum is grossly normal.  LEFT VENTRICLE PLAX 2D LVIDd:         5.20 cm      Diastology LVIDs:         4.20 cm      LV e' medial:    3.44 cm/s LV PW:         1.10 cm      LV E/e' medial:  30.5 LV IVS:        1.10 cm      LV e' lateral:   4.67 cm/s LVOT diam:     1.80 cm      LV E/e' lateral: 22.5 LV SV:         37 LV SV Index:   19 LVOT Area:     2.54 cm  LV Volumes (MOD) LV vol d, MOD A2C: 175.0 ml LV vol d, MOD A4C: 138.0 ml LV vol s, MOD A2C: 110.0 ml LV vol s, MOD A4C: 90.8 ml LV SV MOD A2C:     65.0 ml LV SV MOD A4C:     138.0 ml LV SV MOD BP:      58.3 ml RIGHT VENTRICLE         IVC TAPSE (M-mode): 1.0 cm  IVC diam: 1.90 cm LEFT ATRIUM             Index       RIGHT ATRIUM           Index LA diam:        4.40 cm 2.25 cm/m  RA Area:     18.30 cm LA Vol (A2C):   93.6 ml 47.84 ml/m RA Volume:   50.60 ml  25.86 ml/m LA Vol (A4C):   78.1 ml 39.92 ml/m LA Biplane Vol: 88.7 ml 45.34 ml/m  AORTIC VALVE LVOT Vmax:   90.60 cm/s LVOT Vmean:  58.100 cm/s LVOT VTI:    0.147 m  AORTA Ao Root diam: 3.10 cm Ao Asc diam:  3.50 cm MITRAL VALVE                TRICUSPID VALVE MV Area (PHT): 5.54 cm     TR Peak grad:   27.5 mmHg MV Decel Time: 137 msec     TR Vmax:        262.00 cm/s MV E velocity: 105.00 cm/s MV A velocity: 24.50 cm/s   SHUNTS MV E/A ratio:  4.29         Systemic VTI:  0.15 m                             Systemic Diam: 1.80 cm Rudean Haskell MD  Electronically signed by Rudean Haskell MD Signature Date/Time: 06/04/2020/4:33:11 PM    Final     Scheduled Meds: . amLODipine  10 mg Oral Daily  . atorvastatin  40 mg Oral Daily  . empagliflozin  25 mg Oral Daily  . furosemide  40 mg Intravenous Q12H  . insulin aspart  0-15 Units Subcutaneous TID WC  . insulin aspart  0-5 Units Subcutaneous QHS  . losartan  100 mg Oral Daily  . metoprolol succinate  25 mg Oral Daily  . potassium chloride  10 mEq Oral Daily  . sodium chloride flush  3 mL Intravenous Q12H   Continuous Infusions: . sodium chloride       LOS: 0 days   Marylu Lund, MD [facility] Pager On Amion  If 7PM-7AM, please contact night-coverage 06/04/2020, 4:57 PM

## 2020-06-04 NOTE — Progress Notes (Signed)
  Echocardiogram 2D Echocardiogram has been performed.  [name] 06/04/2020, 2:46 PM

## 2020-06-04 NOTE — ED Notes (Signed)
Attempted report 

## 2020-06-04 NOTE — H&P (Signed)
History and Physical    [name] IOM:[id number] DOB: 11-Apr-1961 DOA: 06/03/2020  PCP: Concepcion Elk, MD   Patient coming from:   Home  Chief Complaint:  SOB  HPI: [name] is a 60 y.o. male with medical history significant for CHF with last EF 30% status post CABG in 2021, diabetes, hypertension, and hyperlipidemia who presented to the ED for shortness of breath that has been gradually worsening over the past few weeks. States that symptoms worsened in the past 3 days.  Has had some pleuritic chest discomfort with deep inspiration but he has had this since having CABG last summer he states. He reports feeling smothered if he lays flat for the past three nights which is not normal for hiim.  Currently not taking a diuretic.  States this feels like when he has had to be admitted to the hospital previously. He uses oxygen intermittently at home but has needed to use more in past few days.  Work-up has been reviewed, notable for elevated BNP, mild elevated troponin, elevated D-dimer. CT angio without definitive evidence of pulmonary embolism but does have findings of CHF.   He was on 4 L of oxygen satting at 98%.  ER provider did decrease oxygen to 3 L and he desatted to 91% and had to be turned back up to 4 L.  He was given Lasix and has been diuresing well in the emergency room.   Review of Systems:  General: Denies fever, chills, weight loss, night sweats. Denies dizziness. Denies change in appetite HENT: Denies head trauma, headache, denies change in hearing, tinnitus.  Denies nasal congestion or bleeding.  Denies sore throat, sores in mouth.  Denies difficulty swallowing Eyes: Denies blurry vision, pain in eye, drainage.  Denies discoloration of eyes. Neck: Denies pain.  Denies swelling.  Denies pain with movement. Cardiovascular: Denies chest pain, palpitations.  Reports leg edema.  Reports orthopnea past few days Respiratory: Reports shortness of breath. Denies cough.   Denies wheezing.  Denies sputum production Gastrointestinal: Denies abdominal pain, swelling.  Denies nausea, vomiting, diarrhea.  Denies melena.  Denies hematemesis. Musculoskeletal: Denies limitation of movement.  Denies deformity or swelling.  Denies pain.  Denies arthralgias or myalgias. Genitourinary: Denies pelvic pain.  Denies urinary frequency or hesitancy.  Denies dysuria.  Skin: Denies rash.  Denies petechiae, purpura, ecchymosis. Neurological: Denies headache.  Denies syncope.  Denies seizure activity. Denies slurred speech, drooping face.  Denies visual change. Psychiatric: Denies depression, anxiety.  Denies hallucinations.  Past Medical History:  Diagnosis Date  . Anxiety   . CAD (coronary artery disease)   . Chronic back pain   . Colon polyps   . Diabetes mellitus   . Heart attack ([place])   . Hyperlipidemia   . Hypertension   . Panic attacks   . PVD (peripheral vascular disease) ([place])     Past Surgical History:  Procedure Laterality Date  . APPLICATION OF A-CELL OF BACK N/A 12/09/2019   Procedure: APPLICATION OF A-CELL OF BACK;  Surgeon: Wonda Olds, MD;  Location: [facility];  Service: Thoracic;  Laterality: N/A;  . APPLICATION OF A-CELL OF CHEST/ABDOMEN N/A 12/04/2019   Procedure: APPLICATION OF A-CELL OF CHEST;  Surgeon: Wonda Olds, MD;  Location: [facility];  Service: Thoracic;  Laterality: N/A;  . APPLICATION OF WOUND VAC N/A 11/27/2019   Procedure: APPLICATION OF WOUND VAC;  Surgeon: Ivin Poot, MD;  Location: [place];  Service: Cardiovascular;  Laterality: N/A;  Lower midline  superficial sternal wound area.  . APPLICATION OF WOUND VAC N/A 12/09/2019   Procedure: WOUND VAC EXCHANGE;  Surgeon: Wonda Olds, MD;  Location: [place];  Service: Thoracic;  Laterality: N/A;  . APPLICATION OF WOUND VAC N/A 12/14/2019   Procedure: WOUND VAC CHANGE;  Surgeon: Wonda Olds, MD;  Location: [place];  Service: Thoracic;  Laterality: N/A;  . CORONARY ANGIOPLASTY WITH  STENT PLACEMENT    . CORONARY ARTERY BYPASS GRAFT N/A 10/29/2019   Procedure: CORONARY ARTERY BYPASS GRAFTING (CABG) using LIMA to LAD; RIMA to PDA; and Left radial harvest vein to Circ.;  Surgeon: Wonda Olds, MD;  Location: [place];  Service: Open Heart Surgery;  Laterality: N/A;  . EAR CYST EXCISION  12/10/2011   Procedure: CYST REMOVAL;  Surgeon: Gae Bon, DDS;  Location: [place];  Service: Oral Surgery;  Laterality: Left;  Left Mandible Cyst Removal  . IR THORACENTESIS ASP PLEURAL SPACE W/IMG GUIDE  10/27/2019  . MULTIPLE EXTRACTIONS WITH ALVEOLOPLASTY  12/10/2011   Procedure: MULTIPLE EXTRACION WITH ALVEOLOPLASTY;  Surgeon: Gae Bon, DDS;  Location: [place];  Service: Oral Surgery;  Laterality: N/A;  Right Maxillary Tuberosity Reduction; Right  Maxillary Buccal Exostosis; Bilateral Mandibular Tori   . PERIPHERAL VASCULAR CATHETERIZATION N/A 12/28/2014   Procedure: Abdominal Aortogram;  Surgeon: Serafina Mitchell, MD;  Location: [place] [facility];  Service: Cardiovascular;  Laterality: N/A;  . PERIPHERAL VASCULAR CATHETERIZATION N/A 12/20/2015   Procedure: Abdominal Aortogram;  Surgeon: Serafina Mitchell, MD;  Location: [place] [facility];  Service: Cardiovascular;  Laterality: N/A;  . PERIPHERAL VASCULAR CATHETERIZATION  12/20/2015   Procedure: Peripheral Vascular Balloon Angioplasty;  Surgeon: Serafina Mitchell, MD;  Location: [place] [facility];  Service: Cardiovascular;;  . RADIAL ARTERY HARVEST Left 10/29/2019   Procedure: RADIAL ARTERY HARVEST;  Surgeon: Wonda Olds, MD;  Location: [place];  Service: Open Heart Surgery;  Laterality: Left;  . RIGHT/LEFT HEART CATH AND CORONARY ANGIOGRAPHY N/A 10/28/2019   Procedure: RIGHT/LEFT HEART CATH AND CORONARY ANGIOGRAPHY;  Surgeon: Burnell Blanks, MD;  Location: [place] [facility];  Service: Cardiovascular;  Laterality: N/A;  . STERNAL WOUND DEBRIDEMENT N/A 12/01/2019   Procedure: STERNAL WOUND DEBRIDEMENT and WOUND VAC CHANGE;  Surgeon:  Wonda Olds, MD;  Location: [place];  Service: Thoracic;  Laterality: N/A;  . STERNAL WOUND DEBRIDEMENT N/A 12/04/2019   Procedure: STERNAL WOUND DEBRIDEMENT and WOUND VAC CHANGE;  Surgeon: Wonda Olds, MD;  Location: [place];  Service: Thoracic;  Laterality: N/A;  . STERNAL WOUND DEBRIDEMENT N/A 12/09/2019   Procedure: STERNAL WOUND DEBRIDEMENT;  Surgeon: Wonda Olds, MD;  Location: [place];  Service: Thoracic;  Laterality: N/A;  . TEE WITHOUT CARDIOVERSION N/A 10/29/2019   Procedure: TRANSESOPHAGEAL ECHOCARDIOGRAM (TEE);  Surgeon: Wonda Olds, MD;  Location: [place];  Service: Open Heart Surgery;  Laterality: N/A;  . WOUND EXPLORATION N/A 11/27/2019   Procedure: EXCISIONAL DEBRIDEMENT OF SUPERFICIAL STERNAL WOUND EXPLORATION;  Surgeon: Ivin Poot, MD;  Location: [facility] OR;  Service: Cardiovascular;  Laterality: N/A;  Superficial sternal wound    Social History  reports that he has quit smoking. His smoking use included cigarettes. He has a 6.25 pack-year smoking history. He has never used smokeless tobacco. He reports that he does not drink alcohol and does not use drugs.  Allergies  Allergen Reactions  . Codeine Shortness Of Breath and Itching  . Gabapentin Other (See Comments)    Suicidal thoughts,  extreme dreams  . Pregabalin     Suicidal thoughts,extreme dreams   . Simvastatin     Pain in joints  . Hydrocodone Nausea Only and Rash    Breathing problems   . Lisinopril Itching and Rash  . Venlafaxine Rash    Family History  Problem Relation Age of Onset  . Diabetes Father   . Kidney failure Father   . Hypertension Father   . Hyperlipidemia Father   . Heart disease Father      Prior to Admission medications   Medication Sig Start Date End Date Taking? Authorizing Provider  acetaminophen (TYLENOL) 500 MG tablet Take 1,000 mg by mouth every 6 (six) hours as needed for headache.    [provider]  amLODipine (NORVASC) 10 MG tablet Take 1 tablet (10 mg  total) by mouth daily. 12/18/19   Nani Skillern, PA-C  apixaban (ELIQUIS) 5 MG TABS tablet Take 1 tablet (5 mg total) by mouth 2 (two) times daily. 11/06/19   Wonda Olds, MD  atorvastatin (LIPITOR) 40 MG tablet Take 1 tablet (40 mg total) by mouth daily. 11/07/19   Elgie Collard, PA-C  clopidogrel (PLAVIX) 75 MG tablet Take 1 tablet (75 mg total) by mouth daily. 11/07/19   Elgie Collard, PA-C  empagliflozin (JARDIANCE) 25 MG TABS tablet Take 25 mg by mouth daily.    [provider]  furosemide (LASIX) 40 MG tablet Take 1 tablet (40 mg total) by mouth daily. 11/06/19   Elgie Collard, PA-C  lidocaine (LIDODERM) 5 % Place 2 patches onto the skin every 12 (twelve) hours. Remove & Discard patch within 12 hours or as directed by MD 11/23/19 11/22/20  Wonda Olds, MD  losartan (COZAAR) 100 MG tablet Take 1 tablet (100 mg total) by mouth daily. 12/18/19   Nani Skillern, PA-C  metFORMIN (GLUCOPHAGE) 1000 MG tablet Take 1,000 mg by mouth 2 (two) times daily with a meal.     [provider]  metoprolol succinate (TOPROL-XL) 25 MG 24 hr tablet Take 1 tablet (25 mg total) by mouth daily. 12/18/19   Nani Skillern, PA-C  Multiple Vitamins-Minerals (CENTRUM SILVER) tablet Take 1 tablet by mouth daily.    [provider]  oxyCODONE (OXY IR/ROXICODONE) 5 MG immediate release tablet Take 1 tablet (5 mg total) by mouth every 4 (four) hours as needed for severe pain. Patient not taking: Reported on 02/05/2020 12/18/19   Nani Skillern, PA-C  oxyCODONE (ROXICODONE) 5 MG immediate release tablet Take 1 tablet (5 mg total) by mouth every 8 (eight) hours as needed. 12/29/19 12/28/20  Wonda Olds, MD  potassium chloride (KLOR-CON) 10 MEQ tablet Take 2 tablets (20 mEq total) by mouth daily. 11/30/19   Nani Skillern, PA-C  Skin Protectants, Misc. (EUCERIN) cream Apply topically daily. Patient taking differently: Apply 1 application topically daily as needed  for dry skin.  06/19/18   Kayleen Memos, DO  traMADol (ULTRAM) 50 MG tablet TAKE 1 TABLET BY MOUTH EVERY 6 HOURS AS NEEDED FOR MODERATE PAIN 01/07/20   Wonda Olds, MD  triamcinolone cream (KENALOG) 0.1 % Apply 1 application topically 2 (two) times daily. Patient taking differently: Apply 1 application topically 2 (two) times daily as needed (rash).  04/06/17   Virgel Manifold, MD  TRULICITY 9.14 NW/2.9FA SOPN Inject 0.75 mg into the skin once a week. 05/04/19   [provider]    Physical Exam: Vitals:   06/03/20 2315 06/03/20  2345 06/04/20 0000 06/04/20 0045  BP: (!) 140/91 (!) 151/90 (!) 150/108 (!) 162/107  Pulse: 84 81 84 84  Resp: 16 18 18 16   Temp:      TempSrc:      SpO2: 95% 100% 100% 99%    Constitutional: NAD, calm, comfortable Vitals:   06/03/20 2315 06/03/20 2345 06/04/20 0000 06/04/20 0045  BP: (!) 140/91 (!) 151/90 (!) 150/108 (!) 162/107  Pulse: 84 81 84 84  Resp: 16 18 18 16   Temp:      TempSrc:      SpO2: 95% 100% 100% 99%   General: WDWN, Alert and oriented x3.  Eyes: EOMI, PERRL, conjunctivae normal.  Sclera nonicteric HENT:  [place]/AT, external ears normal.  Nares patent without epistasis.  Mucous membranes are moist.  Neck: Soft, normal range of motion, supple, no masses, no thyromegaly. Trachea midline Respiratory: Equal breath sounds.  Mild diffuse Rales.  Bibasilar crackles noted.  No wheezing. Normal respiratory effort. No accessory muscle use.  Cardiovascular: Regular rate and rhythm, no murmurs / rubs / gallops. No extremity edema. 2+ pedal pulses.  Healed midline incision over sternum. Abdomen: Soft, no tenderness, nondistended, no rebound or guarding.  No masses palpated. Bowel sounds normoactive Musculoskeletal: FROM. no cyanosis. No joint deformity upper and lower extremities. Normal muscle tone.  Skin: Warm, dry, intact no rashes, lesions, ulcers. No induration Neurologic: CN 2-12 grossly intact.  Normal speech.  Sensation intact, Strength  5/5 in all extremities.   Psychiatric: Normal judgment and insight.  Normal mood.    Labs on Admission: I have personally reviewed following labs and imaging studies  CBC: Recent Labs  Lab 06/03/20 1729 06/03/20 2200  WBC 9.3 9.8  NEUTROABS  --  6.3  HGB 15.0 16.7  HCT 48.9 52.0  MCV 91.2 89.7  PLT 359 401*    Basic Metabolic Panel: Recent Labs  Lab 06/03/20 1729  NA 142  K 4.1  CL 102  CO2 27  GLUCOSE 98  BUN 17  CREATININE 1.04  CALCIUM 8.9    GFR: CrCl cannot be calculated (Unknown ideal weight.).  Liver Function Tests: No results for input(s): AST, ALT, ALKPHOS, BILITOT, PROT, ALBUMIN in the last 168 hours.  Urine analysis:    Component Value Date/Time   COLORURINE YELLOW 11/26/2019 1932   APPEARANCEUR CLEAR 11/26/2019 1932   LABSPEC 1.022 11/26/2019 1932   PHURINE 8.0 11/26/2019 1932   GLUCOSEU >=500 (A) 11/26/2019 1932   HGBUR NEGATIVE 11/26/2019 1932   BILIRUBINUR NEGATIVE 11/26/2019 1932   KETONESUR NEGATIVE 11/26/2019 1932   PROTEINUR 100 (A) 11/26/2019 1932   UROBILINOGEN 0.2 01/30/2011 1501   NITRITE NEGATIVE 11/26/2019 1932   LEUKOCYTESUR NEGATIVE 11/26/2019 1932    Radiological Exams on Admission: DG Chest 2 View  Result Date: 06/03/2020 CLINICAL DATA:  Shortness of breath. EXAM: CHEST - 2 VIEW COMPARISON:  11/26/2019 FINDINGS: The patient is status post prior CABG. The main pulmonary arteries are dilated as before. There are aortic calcifications. The heart size is enlarged. There are findings of mild interstitial edema with small bilateral pleural effusions. There is atelectasis at the lung bases. IMPRESSION: Cardiomegaly with mild interstitial edema and small bilateral pleural effusions. Electronically Signed   By: Constance Holster M.D.   On: 06/03/2020 18:04   CT Angio Chest PE W and/or Wo Contrast  Result Date: 06/04/2020 CLINICAL DATA:  Positive D-dimer.  Shortness of breath. EXAM: CT ANGIOGRAPHY CHEST WITH CONTRAST TECHNIQUE:  Multidetector CT imaging of the chest was  performed using the standard protocol during bolus administration of intravenous contrast. Multiplanar CT image reconstructions and MIPs were obtained to evaluate the vascular anatomy. CONTRAST:  15mL OMNIPAQUE IOHEXOL 350 MG/ML SOLN COMPARISON:  Chest radiography earlier same day. Chest CT 11/26/2019. FINDINGS: Cardiovascular: Previous median sternotomy and CABG. Cardiomegaly. Some calcification of the anterior wall the left ventricle. Thoracic aortic atherosclerotic calcification. Pulmonary arterial opacification is centrally and in the upper lobes. There is diminished opacification of the lower lobe pulmonary arteries that I favor is due to bolus timing. Lower lobe pulmonary arterial branches are not opacified, but this is bilateral and symmetric and there does not appear to be a true filling defect, therefore I do not think that this appearance is due to embolic disease based on any positive finding. Of course there could be a small embolus in the unopacified vessels. All opacified vessels are free of emboli. Mediastinum/Nodes: No mass or adenopathy. Changes related to previous CABG. Sternal nonunion. Lungs/Pleura: Bilateral pleural effusions layering dependently. Dependent pulmonary atelectasis, most pronounced in the lower lobes. Mild interstitial edema pattern. Upper Abdomen: No acute finding. No significant upper abdominal pathology. Musculoskeletal: Minimal thoracic degenerative changes. Sternal nonunion as noted above. Review of the MIP images confirms the above findings. IMPRESSION: 1. Cardiomegaly. Previous median sternotomy and CABG. Some calcification of the anterior wall of the left ventricle. 2. Bilateral pleural effusions layering dependently. Dependent pulmonary atelectasis, most pronounced in the lower lobes. Mild interstitial edema pattern. Findings are consistent with congestive heart failure. 3. Lower lobe pulmonary arterial opacification is poor, but  I favor that this is due to bolus timing. I do not think this appearance is due to embolic disease based on any positive finding. Of course there could be a small embolus in the unopacified vessels of the lower lobes. There is no positive sign of embolic disease in any opacified vessel. 4. Aortic atherosclerosis. Aortic Atherosclerosis (ICD10-I70.0). Electronically Signed   By: Nelson Chimes M.D.   On: 06/04/2020 00:09    EKG: Independently reviewed.  EKG shows normal sinus rhythm with nonspecific ST changes which are unchanged from previous EKGs.  Prolonged QTC at 505  Last echocardiogram was August 2021 which showed an EF of 30% with left ventricular decreased function  Assessment/Plan Principal Problem:   Acute on chronic HFrEF (heart failure with reduced ejection fraction)  [name] is admitted to cardiac telemetry floor. BNP is elevated.  Check serial troponin levels.  In the emergency room troponin will did not increase on two checks. Diurese with Lasix.  Monitor I&Os Daily weights. Supplemental potassium provided while getting Lasix. Check echocardiogram in the morning to evaluate EF, wall motion and valvular function.  Active Problems:   Essential hypertension New home medications of Norvasc, losartan and metoprolol. Monitor blood pressure    Diabetes mellitus type 2 in nonobese  Continue Jardiance.  Metformin is held for 48 hours after having IV contrast for CT angiography of chest Sliding scale insulin be provided as needed for glycemic control.  Check blood sugars with meals and at bedtime Check hemoglobin A1c    Hx of CABG Stable    PAF (paroxysmal atrial fibrillation) Patient is on Eliquis which will be continued    Prolonged Q-T interval on ECG Avoid medications which could further prolong QT interval    DVT prophylaxis: On Eliquis for anticoagulation which will be continued Code Status:   Full code Family Communication:  Diagnosis and plan discussed with  patient.  Patient verbalized understanding and agrees with plan.  Further recommendations to follow as clinically indicated Disposition Plan:   Patient is from:  Home  Anticipated DC to:  Home  Anticipated DC date:  Anticipate two midnight or more stay in the hospital to treat acute condition  Anticipated DC barriers: No barriers to discharge identified at this time  Admission status:  Inpatient   Yevonne Aline Mystique Bjelland MD [facility]  How to contact the [facility] Attending or Consulting provider [place] or covering provider during after hours [place], for this patient?   1. Check the care team in [facility] and look for a) attending/consulting [facility] provider listed and b) the [facility] team listed 2. Log into [email or web address] and use [place]'s universal password to access. If you do not have the password, please contact the hospital operator. 3. Locate the [facility] provider you are looking for under [facility] and page to a number that you can be directly reached. 4. If you still have difficulty reaching the provider, please page the [facility] (Director on Call) for the Hospitalists listed on amion for assistance.  06/04/2020, 1:38 AM

## 2020-06-05 DIAGNOSIS — E119 Type 2 diabetes mellitus without complications: Secondary | ICD-10-CM

## 2020-06-05 LAB — BASIC METABOLIC PANEL
Anion gap: 10 (ref 5–15)
BUN: 19 mg/dL (ref 6–20)
CO2: 28 mmol/L (ref 22–32)
Calcium: 8.8 mg/dL — ABNORMAL LOW (ref 8.9–10.3)
Chloride: 103 mmol/L (ref 98–111)
Creatinine, Ser: 1.34 mg/dL — ABNORMAL HIGH (ref 0.61–1.24)
GFR, Estimated: >60 mL/min (ref >60)
Glucose, Bld: 147 mg/dL — ABNORMAL HIGH (ref 70–99)
Potassium: 4 mmol/L (ref 3.5–5.1)
Sodium: 141 mmol/L (ref 135–145)

## 2020-06-05 LAB — GLUCOSE, CAPILLARY
Glucose-Capillary: 127 mg/dL — ABNORMAL HIGH (ref 70–99)
Glucose-Capillary: 100 mg/dL — ABNORMAL HIGH (ref 70–99)
Glucose-Capillary: 102 mg/dL — ABNORMAL HIGH (ref 70–99)
Glucose-Capillary: 139 mg/dL — ABNORMAL HIGH (ref 70–99)

## 2020-06-05 MED ORDER — CLOPIDOGREL BISULFATE 75 MG PO TABS
75.0000 mg | ORAL_TABLET | Freq: Every day | ORAL | Status: DC
  Administered 2020-06-05 – 2020-06-06 (×2): 75 mg via ORAL
  Filled 2020-06-05 (×2): qty 1

## 2020-06-05 NOTE — Progress Notes (Signed)
PROGRESS NOTE    [name]  EUM:[id number] DOB: 07/13/1960 DOA: 06/03/2020 PCP: Concepcion Elk, MD    Brief Narrative:  60 y.o. male with medical history significant for CHF with last EF 30% status post CABG in 2021, diabetes, hypertension, and hyperlipidemia who presented to the ED for shortness of breath that has been gradually worsening over the past few weeks. States that symptoms worsened in the past 3 days. Has had some pleuritic chest discomfort with deep inspiration but he has had this since having CABG last summer he states. He reports feeling smothered if he lays flat for the past three nights which is not normal for hiim. Currently not taking a diuretic. States this feels like when he has had to be admitted to the hospital previously. He uses oxygen intermittently at home but has needed to use more in past few days.  Work-up has been reviewed, notable for elevated BNP, mild elevated troponin, elevated D-dimer.CT angio without definitive evidence of pulmonary embolism but does have findings of CHF.  He was on 4 L of oxygen satting at 98%.  ER provider did decrease oxygen to 3 L and he desatted to 91% and had to be turned back up to 4 L.  He was given Lasix and has been diuresing well in the emergency room.  Assessment & Plan:   Principal Problem:   Acute on chronic HFrEF (heart failure with reduced ejection fraction) (HCC) Active Problems:   Hx of CABG   PAF (paroxysmal atrial fibrillation) (HCC)   Prolonged Q-T interval on ECG   Essential hypertension   Diabetes mellitus type 2 in nonobese [facility])  Principal Problem:   Acute on chronic HFrEF (heart failure with reduced ejection fraction)  -Presenting with elevated BNP of 1905 -Marked improvement already with IV lasix thus far with very good urine output -Repeat 2d echo reviewed, findings of EF of 25-30%, unchanged from last echo from 8/21 -Follow I/o and daily wts -repeat bmet in AM -Of note, pharmacy reports that pt  has been without his medication for a large part of last year. Pt stated he was not aware his home meds required refill requests when running out -Cr is higher at 1.34. Will hold further lasix today -Repeat bmet in AM  Active Problems:   Essential hypertension -Cont home medications of Norvasc, losartan and metoprolol. -BP trends are stable and controlled    Diabetes mellitus type 2 in nonobese  -Pt was continued on Jardiance on presentation -continue on SSI coverage -a1c noted to be 8.0    Hx of CABG -Stable at this time, denies chest pain -Cont home meds as tolerated -chart reviewed. Pt had been on plavix, listed on outpt medrec from [facility]. Discussed with Pharmacy. Will continue with plavix     PAF (paroxysmal atrial fibrillation) -Patient is on Eliquis which will be continued -No evidence of acute blood loss at this time    Prolonged Q-T interval on ECG Avoid medications which could further prolong QT interval   DVT prophylaxis: Eliquis Code Status: Full Family Communication: Pt in room, family not at bedside  Status is: Inpatient  Remains inpatient appropriate because:IV treatments appropriate due to intensity of illness or inability to take PO and Inpatient level of care appropriate due to severity of illness   Dispo: The patient is from: Home              Anticipated d/c is to: Home  Anticipated d/c date is: 3 days              Patient currently is not medically stable to d/c.   Difficult to place patient No   Consultants:     Procedures:     Antimicrobials: Anti-infectives (From admission, onward)   None      Subjective: States feeling "normal" today. Still on O2  Objective: Vitals:   06/05/20 0600 06/05/20 0734 06/05/20 0946 06/05/20 1136  BP:  136/77 (!) 148/89 116/79  Pulse:  69 62 (!) 54  Resp:  16    Temp:  97.8 F (36.6 C)  97.7 F (36.5 C)  TempSrc:    Oral  SpO2: 99% 92%    Weight:      Height:         Intake/Output Summary (Last 24 hours) at 06/05/2020 1334 Last data filed at 06/05/2020 1300 Gross per 24 hour  Intake 720 ml  Output 3200 ml  Net -2480 ml   Filed Weights   06/04/20 1400 06/05/20 0042 06/05/20 0350  Weight: 91.1 kg 91.3 kg 91.3 kg    Examination: General exam: Awake, laying in bed, in nad Respiratory system: Normal respiratory effort, no wheezing Cardiovascular system: regular rate, s1, s2 Gastrointestinal system: Soft, nondistended, positive BS Central nervous system: CN2-12 grossly intact, strength intact Extremities: Perfused, no clubbing Skin: Normal skin turgor, no notable skin lesions seen Psychiatry: Mood normal // no visual hallucinations   Data Reviewed: I have personally reviewed following labs and imaging studies  CBC: Recent Labs  Lab 06/03/20 1729 06/03/20 2200  WBC 9.3 9.8  NEUTROABS  --  6.3  HGB 15.0 16.7  HCT 48.9 52.0  MCV 91.2 89.7  PLT 359 527*   Basic Metabolic Panel: Recent Labs  Lab 06/03/20 1729 06/05/20 0330  NA 142 141  K 4.1 4.0  CL 102 103  CO2 27 28  GLUCOSE 98 147*  BUN 17 19  CREATININE 1.04 1.34*  CALCIUM 8.9 8.8*   GFR: Estimated Creatinine Clearance: 64.8 mL/min (A) (by C-G formula based on SCr of 1.34 mg/dL (H)). Liver Function Tests: No results for input(s): AST, ALT, ALKPHOS, BILITOT, PROT, ALBUMIN in the last 168 hours. No results for input(s): LIPASE, AMYLASE in the last 168 hours. No results for input(s): AMMONIA in the last 168 hours. Coagulation Profile: No results for input(s): INR, PROTIME in the last 168 hours. Cardiac Enzymes: No results for input(s): CKTOTAL, CKMB, CKMBINDEX, TROPONINI in the last 168 hours. BNP (last 3 results) No results for input(s): PROBNP in the last 8760 hours. HbA1C: Recent Labs    06/04/20 1433  HGBA1C 8.0*   CBG: Recent Labs  Lab 06/04/20 2211 06/05/20 0612 06/05/20 1136  GLUCAP 161* 127* 100*   Lipid Profile: No results for input(s): CHOL, HDL,  LDLCALC, TRIG, CHOLHDL, LDLDIRECT in the last 72 hours. Thyroid Function Tests: No results for input(s): TSH, T4TOTAL, FREET4, T3FREE, THYROIDAB in the last 72 hours. Anemia Panel: No results for input(s): VITAMINB12, FOLATE, FERRITIN, TIBC, IRON, RETICCTPCT in the last 72 hours. Sepsis Labs: No results for input(s): PROCALCITON, LATICACIDVEN in the last 168 hours.  Recent Results (from the past 240 hour(s))  Resp Panel by RT-PCR (Flu A&B, Covid) Nasopharyngeal Swab     Status: None   Collection Time: 06/03/20  9:11 PM   Specimen: Nasopharyngeal Swab; Nasopharyngeal(NP) swabs in vial transport medium  Result Value Ref Range Status   SARS Coronavirus 2 by RT PCR NEGATIVE NEGATIVE Final  Comment: (NOTE) SARS-CoV-2 target nucleic acids are NOT DETECTED.  The SARS-CoV-2 RNA is generally detectable in upper respiratory specimens during the acute phase of infection. The lowest concentration of SARS-CoV-2 viral copies this assay can detect is 138 copies/mL. A negative result does not preclude SARS-Cov-2 infection and should not be used as the sole basis for treatment or other patient management decisions. A negative result may occur with  improper specimen collection/handling, submission of specimen other than nasopharyngeal swab, presence of viral mutation(s) within the areas targeted by this assay, and inadequate number of viral copies(<138 copies/mL). A negative result must be combined with clinical observations, patient history, and epidemiological information. The expected result is Negative.  Fact Sheet for Patients:  EntrepreneurPulse.com.au  Fact Sheet for Healthcare Providers:  IncredibleEmployment.be  This test is no t yet approved or cleared by the [place] FDA and  has been authorized for detection and/or diagnosis of SARS-CoV-2 by FDA under an Emergency Use Authorization (EUA). This EUA will remain  in effect (meaning this test  can be used) for the duration of the COVID-19 declaration under Section 564(b)(1) of the Act, 21 U.S.C.section 360bbb-3(b)(1), unless the authorization is terminated  or revoked sooner.       Influenza A by PCR NEGATIVE NEGATIVE Final   Influenza B by PCR NEGATIVE NEGATIVE Final    Comment: (NOTE) The Xpert Xpress SARS-CoV-2/FLU/RSV plus assay is intended as an aid in the diagnosis of influenza from Nasopharyngeal swab specimens and should not be used as a sole basis for treatment. Nasal washings and aspirates are unacceptable for Xpert Xpress SARS-CoV-2/FLU/RSV testing.  Fact Sheet for Patients: EntrepreneurPulse.com.au  Fact Sheet for Healthcare Providers: IncredibleEmployment.be  This test is not yet approved or cleared by the [place] FDA and has been authorized for detection and/or diagnosis of SARS-CoV-2 by FDA under an Emergency Use Authorization (EUA). This EUA will remain in effect (meaning this test can be used) for the duration of the COVID-19 declaration under Section 564(b)(1) of the Act, 21 U.S.C. section 360bbb-3(b)(1), unless the authorization is terminated or revoked.  Performed at [facility], [facility] [address], [place], [place] [address]      Radiology Studies: DG Chest 2 View  Result Date: 06/03/2020 CLINICAL DATA:  Shortness of breath. EXAM: CHEST - 2 VIEW COMPARISON:  11/26/2019 FINDINGS: The patient is status post prior CABG. The main pulmonary arteries are dilated as before. There are aortic calcifications. The heart size is enlarged. There are findings of mild interstitial edema with small bilateral pleural effusions. There is atelectasis at the lung bases. IMPRESSION: Cardiomegaly with mild interstitial edema and small bilateral pleural effusions. Electronically Signed   By: Constance Holster M.D.   On: 06/03/2020 18:04   CT Angio Chest PE W and/or Wo Contrast  Result Date: 06/04/2020 CLINICAL DATA:   Positive D-dimer.  Shortness of breath. EXAM: CT ANGIOGRAPHY CHEST WITH CONTRAST TECHNIQUE: Multidetector CT imaging of the chest was performed using the standard protocol during bolus administration of intravenous contrast. Multiplanar CT image reconstructions and MIPs were obtained to evaluate the vascular anatomy. CONTRAST:  153mL OMNIPAQUE IOHEXOL 350 MG/ML SOLN COMPARISON:  Chest radiography earlier same day. Chest CT 11/26/2019. FINDINGS: Cardiovascular: Previous median sternotomy and CABG. Cardiomegaly. Some calcification of the anterior wall the left ventricle. Thoracic aortic atherosclerotic calcification. Pulmonary arterial opacification is centrally and in the upper lobes. There is diminished opacification of the lower lobe pulmonary arteries that I favor is due to bolus timing. Lower lobe pulmonary  arterial branches are not opacified, but this is bilateral and symmetric and there does not appear to be a true filling defect, therefore I do not think that this appearance is due to embolic disease based on any positive finding. Of course there could be a small embolus in the unopacified vessels. All opacified vessels are free of emboli. Mediastinum/Nodes: No mass or adenopathy. Changes related to previous CABG. Sternal nonunion. Lungs/Pleura: Bilateral pleural effusions layering dependently. Dependent pulmonary atelectasis, most pronounced in the lower lobes. Mild interstitial edema pattern. Upper Abdomen: No acute finding. No significant upper abdominal pathology. Musculoskeletal: Minimal thoracic degenerative changes. Sternal nonunion as noted above. Review of the MIP images confirms the above findings. IMPRESSION: 1. Cardiomegaly. Previous median sternotomy and CABG. Some calcification of the anterior wall of the left ventricle. 2. Bilateral pleural effusions layering dependently. Dependent pulmonary atelectasis, most pronounced in the lower lobes. Mild interstitial edema pattern. Findings are consistent  with congestive heart failure. 3. Lower lobe pulmonary arterial opacification is poor, but I favor that this is due to bolus timing. I do not think this appearance is due to embolic disease based on any positive finding. Of course there could be a small embolus in the unopacified vessels of the lower lobes. There is no positive sign of embolic disease in any opacified vessel. 4. Aortic atherosclerosis. Aortic Atherosclerosis (ICD10-I70.0). Electronically Signed   By: Nelson Chimes M.D.   On: 06/04/2020 00:09   ECHOCARDIOGRAM COMPLETE  Result Date: 06/04/2020    ECHOCARDIOGRAM REPORT   Patient Name:   THELBERT GARTIN Soyars Date of Exam: 06/04/2020 Medical Rec #:  [id number]         Height:       67.0 in Accession #:    [id number]        Weight:       185.0 lb Date of Birth:  [birth year]         BSA:          1.956 m Patient Age:    4 years          BP:           153/95 mmHg Patient Gender: M                 HR:           61 bpm. Exam Location:  Inpatient Procedure: 2D Echo Indications:    acute systolic chf  History:        Patient has prior history of Echocardiogram examinations, most                 recent 12/01/2019. Prior CABG, Signs/Symptoms:Shortness of                 Breath; Risk Factors:Hypertension, Diabetes and Dyslipidemia.  Sonographer:    Johny Chess Referring Phys: [id number] [place]  1. Left ventricular ejection fraction, by estimation, is 25 to 30%. The left ventricle has severely decreased function. The left ventricle demonstrates regional wall motion abnormalities (see scoring diagram/findings for description). There is mild concentric left ventricular hypertrophy. Left ventricular diastolic parameters are consistent with Grade III diastolic dysfunction (restrictive).  2. Right ventricular systolic function is moderately reduced. The right ventricular size is normal. There is normal pulmonary artery systolic pressure. The estimated right ventricular systolic pressure is 67.6  mmHg.  3. Left atrial size was severely dilated.  4. The mitral valve is normal in structure. No evidence of mitral valve regurgitation. No evidence of  mitral stenosis.  5. Tricuspid regurgitation is eccentric; mild but may be underestimated in this study.  6. The aortic valve is grossly normal. Aortic valve regurgitation is not visualized.  7. The inferior vena cava is normal in size with greater than 50% respiratory variability, suggesting right atrial pressure of 3 mmHg. Comparison(s): A prior study was performed on 12/01/19. Prior images reviewed side by side. Pleural effusion has resolved, right ventricular function is worse, left ventricular function is similar. FINDINGS  Left Ventricle: There is no LV thrombus. Left ventricular ejection fraction, by estimation, is 25 to 30%. The left ventricle has severely decreased function. The left ventricle demonstrates regional wall motion abnormalities. The left ventricular internal cavity size was normal in size. There is mild concentric left ventricular hypertrophy. Left ventricular diastolic parameters are consistent with Grade III diastolic dysfunction (restrictive).  LV Wall Scoring: The mid and distal anterior wall, entire apex, mid and distal inferior wall, mid anterolateral segment, and mid inferoseptal segment are hypokinetic. Right Ventricle: The right ventricular size is normal. No increase in right ventricular wall thickness. Right ventricular systolic function is moderately reduced. There is normal pulmonary artery systolic pressure. The tricuspid regurgitant velocity is 2.62 m/s, and with an assumed right atrial pressure of 3 mmHg, the estimated right ventricular systolic pressure is 34.1 mmHg. Left Atrium: Left atrial size was severely dilated. Right Atrium: Right atrial size was normal in size. Pericardium: There is no evidence of pericardial effusion. Mitral Valve: The mitral valve is normal in structure. No evidence of mitral valve regurgitation. No  evidence of mitral valve stenosis. Tricuspid Valve: Tricuspid regurgitation is eccentric; mild but may be underestimated in this study. The tricuspid valve is not well visualized. Tricuspid valve regurgitation is mild. Aortic Valve: The aortic valve is grossly normal. There is mild aortic valve annular calcification. Aortic valve regurgitation is not visualized. Pulmonic Valve: The pulmonic valve was grossly normal. Pulmonic valve regurgitation is not visualized. Aorta: The aortic root, ascending aorta and aortic arch are all structurally normal, with no evidence of dilitation or obstruction. Venous: The inferior vena cava is normal in size with greater than 50% respiratory variability, suggesting right atrial pressure of 3 mmHg. IAS/Shunts: The atrial septum is grossly normal.  LEFT VENTRICLE PLAX 2D LVIDd:         5.20 cm      Diastology LVIDs:         4.20 cm      LV e' medial:    3.44 cm/s LV PW:         1.10 cm      LV E/e' medial:  30.5 LV IVS:        1.10 cm      LV e' lateral:   4.67 cm/s LVOT diam:     1.80 cm      LV E/e' lateral: 22.5 LV SV:         37 LV SV Index:   19 LVOT Area:     2.54 cm  LV Volumes (MOD) LV vol d, MOD A2C: 175.0 ml LV vol d, MOD A4C: 138.0 ml LV vol s, MOD A2C: 110.0 ml LV vol s, MOD A4C: 90.8 ml LV SV MOD A2C:     65.0 ml LV SV MOD A4C:     138.0 ml LV SV MOD BP:      58.3 ml RIGHT VENTRICLE         IVC TAPSE (M-mode): 1.0 cm  IVC diam: 1.90 cm LEFT ATRIUM  Index       RIGHT ATRIUM           Index LA diam:        4.40 cm 2.25 cm/m  RA Area:     18.30 cm LA Vol (A2C):   93.6 ml 47.84 ml/m RA Volume:   50.60 ml  25.86 ml/m LA Vol (A4C):   78.1 ml 39.92 ml/m LA Biplane Vol: 88.7 ml 45.34 ml/m  AORTIC VALVE LVOT Vmax:   90.60 cm/s LVOT Vmean:  58.100 cm/s LVOT VTI:    0.147 m  AORTA Ao Root diam: 3.10 cm Ao Asc diam:  3.50 cm MITRAL VALVE                TRICUSPID VALVE MV Area (PHT): 5.54 cm     TR Peak grad:   27.5 mmHg MV Decel Time: 137 msec     TR Vmax:         262.00 cm/s MV E velocity: 105.00 cm/s MV A velocity: 24.50 cm/s   SHUNTS MV E/A ratio:  4.29         Systemic VTI:  0.15 m                             Systemic Diam: 1.80 cm Rudean Haskell MD Electronically signed by Rudean Haskell MD Signature Date/Time: 06/04/2020/4:33:11 PM    Final     Scheduled Meds: . amLODipine  10 mg Oral Daily  . atorvastatin  40 mg Oral Daily  . empagliflozin  25 mg Oral Daily  . insulin aspart  0-15 Units Subcutaneous TID WC  . insulin aspart  0-5 Units Subcutaneous QHS  . losartan  100 mg Oral Daily  . metoprolol succinate  25 mg Oral Daily  . potassium chloride  10 mEq Oral Daily  . sodium chloride flush  3 mL Intravenous Q12H   Continuous Infusions: . sodium chloride       LOS: 1 day   Marylu Lund, MD [facility] Pager On Amion  If 7PM-7AM, please contact night-coverage 06/05/2020, 1:34 PM

## 2020-06-06 ENCOUNTER — Other Ambulatory Visit (HOSPITAL_COMMUNITY): Payer: Self-pay | Admitting: Internal Medicine

## 2020-06-06 ENCOUNTER — Encounter (HOSPITAL_COMMUNITY): Payer: Self-pay | Admitting: Family Medicine

## 2020-06-06 LAB — BASIC METABOLIC PANEL
Anion gap: 9 (ref 5–15)
BUN: 20 mg/dL (ref 6–20)
CO2: 25 mmol/L (ref 22–32)
Calcium: 8.6 mg/dL — ABNORMAL LOW (ref 8.9–10.3)
Chloride: 104 mmol/L (ref 98–111)
Creatinine, Ser: 1.07 mg/dL (ref 0.61–1.24)
GFR, Estimated: >60 mL/min (ref >60)
Glucose, Bld: 131 mg/dL — ABNORMAL HIGH (ref 70–99)
Potassium: 4 mmol/L (ref 3.5–5.1)
Sodium: 138 mmol/L (ref 135–145)

## 2020-06-06 LAB — GLUCOSE, CAPILLARY
Glucose-Capillary: 121 mg/dL — ABNORMAL HIGH (ref 70–99)
Glucose-Capillary: 143 mg/dL — ABNORMAL HIGH (ref 70–99)

## 2020-06-06 MED ORDER — FUROSEMIDE 40 MG PO TABS
40.0000 mg | ORAL_TABLET | Freq: Every day | ORAL | Status: DC
  Administered 2020-06-06: 40 mg via ORAL
  Filled 2020-06-06: qty 1

## 2020-06-06 MED ORDER — AMLODIPINE BESYLATE 10 MG PO TABS: 10.0000 mg | ORAL_TABLET | Freq: Every day | ORAL | 0 refills | Status: DC

## 2020-06-06 MED ORDER — METOPROLOL SUCCINATE ER 25 MG PO TB24: 25.0000 mg | ORAL_TABLET | Freq: Every day | ORAL | 0 refills | Status: DC

## 2020-06-06 MED ORDER — APIXABAN 5 MG PO TABS: 5.0000 mg | ORAL_TABLET | Freq: Two times a day (BID) | ORAL | 0 refills | Status: DC

## 2020-06-06 MED ORDER — CLOPIDOGREL BISULFATE 75 MG PO TABS: 75.0000 mg | ORAL_TABLET | Freq: Every day | ORAL | 0 refills | Status: DC

## 2020-06-06 MED ORDER — APIXABAN 5 MG PO TABS
5.0000 mg | ORAL_TABLET | Freq: Two times a day (BID) | ORAL | Status: DC
  Administered 2020-06-06: 5 mg via ORAL
  Filled 2020-06-06: qty 1

## 2020-06-06 MED ORDER — ATORVASTATIN CALCIUM 40 MG PO TABS: 40.0000 mg | ORAL_TABLET | Freq: Every day | ORAL | 0 refills | Status: DC

## 2020-06-06 MED ORDER — EMPAGLIFLOZIN 25 MG PO TABS: 25.0000 mg | ORAL_TABLET | Freq: Every day | ORAL | 0 refills | Status: DC

## 2020-06-06 MED ORDER — LOSARTAN POTASSIUM 100 MG PO TABS: 100.0000 mg | ORAL_TABLET | Freq: Every day | ORAL | 0 refills | Status: DC

## 2020-06-06 MED ORDER — POTASSIUM CHLORIDE CRYS ER 10 MEQ PO TBCR: 10.0000 meq | EXTENDED_RELEASE_TABLET | Freq: Every day | ORAL | 0 refills | Status: DC

## 2020-06-06 MED ORDER — FUROSEMIDE 40 MG PO TABS: 40.0000 mg | ORAL_TABLET | Freq: Every day | ORAL | 0 refills | Status: DC

## 2020-06-06 MED FILL — CLOPIDOGREL 75 MG TABLET: 75 | 30 days supply | Qty: 30 | Fill #0

## 2020-06-06 MED FILL — ELIQUIS 5 MG TABLET: 5 | 30 days supply | Qty: 60 | Fill #0

## 2020-06-06 MED FILL — ATORVASTATIN CALCIUM 40 MG: 40 | 30 days supply | Qty: 30 | Fill #0

## 2020-06-06 MED FILL — POTASSIUM CHL ER M10 TABLET: 10 | 30 days supply | Qty: 30 | Fill #0

## 2020-06-06 MED FILL — METOPROLOL SUCCINATE ER 25: 25 | 30 days supply | Qty: 30 | Fill #0

## 2020-06-06 MED FILL — AMLODIPINE BESYLATE 10 MG T: 10 | 30 days supply | Qty: 30 | Fill #0

## 2020-06-06 MED FILL — JARDIANCE 25 MG TABLET: 25 | 14 days supply | Qty: 14 | Fill #0

## 2020-06-06 MED FILL — FUROSEMIDE 40 MG TABLET: 40 | 30 days supply | Qty: 30 | Fill #0

## 2020-06-06 MED FILL — LOSARTAN POTASSIUM 100 MG T: 100 | 30 days supply | Qty: 30 | Fill #0

## 2020-06-06 NOTE — Plan of Care (Signed)
  Problem: Activity: Goal: Risk for activity intolerance will decrease Outcome: Progressing   Problem: Coping: Goal: Level of anxiety will decrease Outcome: Progressing   Problem: Elimination: Goal: Will not experience complications related to urinary retention Outcome: Progressing   Problem: Safety: Goal: Ability to remain free from injury will improve Outcome: Progressing   Problem: Activity: Goal: Capacity to carry out activities will improve Outcome: Progressing

## 2020-06-06 NOTE — Progress Notes (Signed)
  Mobility Specialist Criteria Algorithm Info.  SATURATION QUALIFICATIONS: (This note is used to comply with regulatory documentation for home oxygen)  Patient Saturations on Room Air at Rest = 91%  Patient Saturations on Room Air while Ambulating = 86%  Patient Saturations on 2 Liters of oxygen while Ambulating = 95%  Please briefly explain why patient needs home oxygen:  Mobility Team:  [facility] elevated:Self regulated Activity: Ambulated in hall; Dangled on edge of bed Range of motion: Active; All extremities Level of assistance: Independent Assistive device: None Minutes sitting in chair:  Minutes stood: 4 minutes Minutes ambulated: 4 minutes Distance ambulated (ft): 480 ft Mobility response: Tolerated well Bed Position: Semi-fowlers  Patient agreed to participate in mobility this morning. Reports being completely independent with ambulation and ADL's PTA. He is independent with bed mobility and ambulation. Ambulated 480 feet with steady gait in hallway without incident or complaint. Required 2L O2 to maintain an oxygen saturation >87% while ambulating. Is now sitting EOB with all needs met.   06/06/2020 9:25 AM

## 2020-06-06 NOTE — Discharge Instructions (Signed)

## 2020-06-06 NOTE — TOC Transition Note (Signed)
Transition of Care [facility]) - CM/SW Discharge Note   Patient Details  Name: [name] MRN: [id number] Date of Birth: 15-Mar-1961  Transition of Care [facility]) CM/SW Contact:  Zenon Mayo, RN Phone Number: 06/06/2020, 12:57 PM   Clinical Narrative:    Patient is for dc today per MD, he has home concentrator with [employer], his PCP is Sherlean Foot in [place] and he has an apt tomorrow. TOC is filling the medications for patient.  His wife will transport him home today.  Zach with [employer] is coming up to speak to him about  His oxygen and will bring nasal cannula tubing and a tank for him to go home with.  Final next level of care: Home/Self Care Barriers to Discharge: No Barriers Identified   Patient Goals and CMS Choice Patient states their goals for this hospitalization and ongoing recovery are:: get better   Choice offered to / list presented to : NA  Discharge Placement                       Discharge Plan and Services                          HH Arranged: NA          Social Determinants of Health (SDOH) Interventions     Readmission Risk Interventions No flowsheet data found.

## 2020-06-06 NOTE — Progress Notes (Signed)
Dr. Wyline Copas reached out requesting arrangement of close OP f/u for this patient. Follow-up has been arranged on Monday Jun 13, 2020 2:15 PM, appt info placed on chart. Ritesh Opara PA-C

## 2020-06-06 NOTE — Progress Notes (Signed)
Heart Failure Nurse Navigator Progress Note  PCP: Concepcion Elk, MD PCP-Cardiologist: [facility] Admission Diagnosis: CHF Admitted from: Home with spouse  Presentation:   [name] presented with [facility] x several weeks, some chest pain. Sleeping elevated. 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 Julien Girt. Going home on 2L cont. O2.   ECHO/ LVEF: 05/2020 25-30%, G3DD, RV mod reduced.  11/2019 30%, RV mildly reduced  Clinical Course:  Past Medical History:  Diagnosis Date  . Anxiety   . CAD (coronary artery disease)   . Chronic back pain   . Colon polyps   . Diabetes mellitus   . Heart attack ([place])   . Hyperlipidemia   . Hypertension   . Panic attacks   . PVD (peripheral vascular disease) ([place])      Social History   Socioeconomic History  . Marital status: Legally Separated    Spouse name: Not on file  . Number of children: 3  . Years of education: Not on file  . Highest education level: Not on file  Occupational History    Comment: Unemployed  Tobacco Use  . Smoking status: Former Smoker    Packs/day: 0.25    Years: 25.00    Pack years: 6.25    Types: Cigarettes    Start date: 57    Quit date: 10/23/2019    Years since quitting: 0.6  . Smokeless tobacco: Never Used  . Tobacco comment: 1/4 pack per day  Vaping Use  . Vaping Use: Never used  Substance and Sexual Activity  . Alcohol use: No    Alcohol/week: 0.0 standard drinks  . Drug use: No  . Sexual activity: Not on file  Other Topics Concern  . Not on file  Social History Narrative  . Not on file   Social Determinants of Health   Financial Resource Strain: Low Risk   . Difficulty of Paying Living Expenses: Not very hard  Food Insecurity: No Food Insecurity  . Worried About Charity fundraiser in the Last Year: Never true  . Ran Out of Food in the Last Year: Never true  Transportation Needs: No Transportation Needs  . Lack of Transportation (Medical): No  . Lack of Transportation (Non-Medical): No  Physical Activity:  Insufficiently Active  . Days of Exercise per Week: 1 day  . Minutes of Exercise per Session: 30 min  Stress: Not on file  Social Connections: Socially Isolated  . Frequency of Communication with Friends and Family: More than three times a week  . Frequency of Social Gatherings with Friends and Family: More than three times a week  . Attends Religious Services: Never  . Active Member of Clubs or Organizations: No  . Attends Archivist Meetings: Never  . Marital Status: Divorced   High Risk Criteria for Readmission and/or Poor Patient Outcomes:  Heart failure hospital admissions (last 6 months): 1   No Show rate: 7%  Difficult social situation: yes, lives with mother  Demonstrates medication adherence: no  Primary Language: English  Literacy level: Able to read/write and comprehend  Education Assessment and Provision:  Detailed education and instructions provided on heart failure disease management including the following:  Signs and symptoms of Heart Failure When to call the physician Importance of daily weights Low sodium diet Fluid restriction Medication management Anticipated future follow-up appointments  Patient education given on each of the above topics.  Patient acknowledges understanding via teach back method and acceptance of all instructions.  Education Materials:  "Living Better With  Heart Failure" Booklet, HF zone tool, & Daily Weight Tracker Tool.  Patient has scale at home: yes Patient has pill box at home: yes  Barriers of Care:   -none  Considerations/Referrals:   Referral made to Heart Failure Pharmacist Stewardship: yes, will see at [place] appt Referral made to [place] clinic: yes, 2/16 @ 10am   Items for Follow-up on DC/TOC: -medication regimen -education  Pricilla Holm, RN, BSN Heart Failure Nurse Navigator Heart Impact Team [phone]

## 2020-06-06 NOTE — Discharge Summary (Signed)
Physician Discharge Summary  [name] OZD:[id number] DOB: 1960-09-22 DOA: 06/03/2020  PCP: Concepcion Elk, MD  Admit date: 06/03/2020 Discharge date: 06/06/2020  Admitted From: Home Disposition:  Home  Recommendations for Outpatient Follow-up:  1. Follow up with PCP in 1-2 weeks 2. Follow up with Cardiology as scheduled on 2/21  Discharge Condition:Stable CODE STATUS:Full Diet recommendation: Heart healthy, diabetic   Brief/Interim Summary: 60 y.o.malewith medical history significant forCHF with last EF 30% status post CABG in 2021, diabetes, hypertension, and hyperlipidemia who presented to the ED for shortness of breath that has been gradually worsening over the pastfewweeks. States that symptoms worsened in thepast 3 days. Has had some pleuritic chest discomfortwith deep inspiration but he has had this since having CABG last summer he states.He reports feeling smothered if he lays flat for the past three nights which is not normal for hiim.Currently not taking a diuretic. States this feels like when he has had to be admitted to the hospital previously.He uses oxygen intermittently at home but has needed to use more in past few days. Work-up has been reviewed, notable for elevated BNP, mild elevated troponin, elevated D-dimer.CT angio without definitive evidence of pulmonary embolismbutdoes have findings of CHF. He was on 4 L of oxygen satting at 98%. ER provider did decrease oxygen to 3 L and he desatted to 91% and had to be turned back up to 4 L. He was given Lasix and has been diuresing well in the emergency room.  Discharge Diagnoses:  Principal Problem:   Acute on chronic HFrEF (heart failure with reduced ejection fraction) (HCC) Active Problems:   Hx of CABG   PAF (paroxysmal atrial fibrillation) (HCC)   Prolonged Q-T interval on ECG   Essential hypertension   Diabetes mellitus type 2 in nonobese [facility])  Principal Problem: Acute on chronic HFrEF  (heart failure with reduced ejection fraction)  -Presenting with elevated BNP of 1905 -Marked improvement already with IV lasix thus far with very good urine output -Repeat 2d echo reviewed, findings of EF of 25-30%, unchanged from last echo from 8/21 -Follow I/o and daily wts -repeat bmet in AM -Of note, pharmacy reports that pt has been without his medication for a large part of last year. Pt stated he was not aware his home meds required refill requests when running out -Pt was transitioned to PO lasix after Cr bumped slightly -Pt wt down to 90kg, same dry wt as documented in last Cardiology clinic note from 8/21. -Have discussed with Cardiology regarding arranging close follow up  Active Problems: Essential hypertension -Cont home medications of Norvasc, losartan and metoprolol. -BP trends are stable and controlled  Diabetes mellitus type 2 in nonobese  -Pt was continued on Jardiance on presentation, would continue on d/c -continue on SSI coverage -a1c noted to be 8.0  Hx of CABG -Stable at this time, denies chest pain -Cont home meds as tolerated -chart reviewed. Pt had been on plavix, listed on outpt medrec from [facility]. Discussed with Pharmacy. Will continue with plavix   PAF (paroxysmal atrial fibrillation) -Patient is on Eliquis which will be continued -No evidence of acute blood loss at this time  Prolonged Q-T interval on ECG Avoid medications which could further prolong QT interval   Discharge Instructions   Allergies as of 06/06/2020      Reactions   Codeine Shortness Of Breath, Itching   Hydrocodone Shortness Of Breath, Nausea Only, Rash   Breathing problems ALSO   Gabapentin Other (See Comments)   Suicidal  thoughts, extreme dreams   Pregabalin Other (See Comments)   Suicidal thoughts, extreme dreams    Simvastatin Other (See Comments)   Pain in joints   Lisinopril Itching, Rash   Venlafaxine Rash      Medication List    STOP taking these  medications   aspirin 325 MG EC tablet   eucerin cream   ibuprofen 200 MG tablet Commonly known as: ADVIL   lidocaine 5 % Commonly known as: Lidoderm   metFORMIN 1000 MG tablet Commonly known as: GLUCOPHAGE   oxyCODONE 5 MG immediate release tablet Commonly known as: Roxicodone   traMADol 50 MG tablet Commonly known as: Waldon Reining FlexTouch 100 UNIT/ML FlexTouch Pen Generic drug: insulin degludec   triamcinolone 0.1 % Commonly known as: KENALOG   Trulicity 1.5 AL/9.3XT Sopn Generic drug: Dulaglutide     TAKE these medications   amLODipine 10 MG tablet Commonly known as: NORVASC Take 1 tablet (10 mg total) by mouth daily.   apixaban 5 MG Tabs tablet Commonly known as: ELIQUIS Take 1 tablet (5 mg total) by mouth 2 (two) times daily.   atorvastatin 40 MG tablet Commonly known as: LIPITOR Take 1 tablet (40 mg total) by mouth daily. What changed: Another medication with the same name was removed. Continue taking this medication, and follow the directions you see here.   Centrum Silver tablet Take 1 tablet by mouth 2 (two) times a week.   clopidogrel 75 MG tablet Commonly known as: PLAVIX Take 1 tablet (75 mg total) by mouth daily.   empagliflozin 25 MG Tabs tablet Commonly known as: Jardiance Take 1 tablet (25 mg total) by mouth daily. Start taking on: June 07, 2020 What changed: Another medication with the same name was removed. Continue taking this medication, and follow the directions you see here.   furosemide 40 MG tablet Commonly known as: Lasix Take 1 tablet (40 mg total) by mouth daily.   losartan 100 MG tablet Commonly known as: COZAAR Take 1 tablet (100 mg total) by mouth daily.   metoprolol succinate 25 MG 24 hr tablet Commonly known as: TOPROL-XL Take 1 tablet (25 mg total) by mouth daily. Start taking on: June 07, 2020   OXYGEN Inhale 2-3 L/min into the lungs at bedtime as needed (for shortness of breath).   potassium  chloride 10 MEQ tablet Commonly known as: KLOR-CON Take 1 tablet (10 mEq total) by mouth daily. Start taking on: June 07, 2020 What changed: how much to take            Durable Medical Equipment  (From admission, onward)         Start     Ordered   06/06/20 1216  For home use only DME oxygen  Once       Question Answer Comment  Length of Need Lifetime   Mode or (Route) Nasal cannula   Liters per Minute 2   Frequency Continuous (stationary and portable oxygen unit needed)   Oxygen delivery system Gas      06/06/20 1216          Follow-up Information    Erlene Quan, PA-C Follow up.   Specialties: Cardiology, Radiology Why: [facility] (Cardiology) - [place] location - a follow-up has been arranged for you on Monday Jun 13, 2020 at 2:15 PM (Arrive by 2:00 PM). Lurena Joiner is one of the PAs with Dr. Jacalyn Lefevre office.  Contact information: [place] [address] [phone]  Concepcion Elk, MD. Schedule an appointment as soon as possible for a visit in 2 week(s).   Specialty: Internal Medicine Contact information: [address] [place] [address] [phone]        Lelon Perla, MD .   Specialty: Cardiology Contact information: [address] [place]  [address] [phone]              Allergies  Allergen Reactions  . Codeine Shortness Of Breath and Itching  . Hydrocodone Shortness Of Breath, Nausea Only and Rash    Breathing problems ALSO  . Gabapentin Other (See Comments)    Suicidal thoughts, extreme dreams  . Pregabalin Other (See Comments)    Suicidal thoughts, extreme dreams   . Simvastatin Other (See Comments)    Pain in joints  . Lisinopril Itching and Rash  . Venlafaxine Rash    Procedures/Studies: DG Chest 2 View  Result Date: 06/03/2020 CLINICAL DATA:  Shortness of breath. EXAM: CHEST - 2 VIEW COMPARISON:  11/26/2019 FINDINGS: The patient is status post  prior CABG. The main pulmonary arteries are dilated as before. There are aortic calcifications. The heart size is enlarged. There are findings of mild interstitial edema with small bilateral pleural effusions. There is atelectasis at the lung bases. IMPRESSION: Cardiomegaly with mild interstitial edema and small bilateral pleural effusions. Electronically Signed   By: Constance Holster M.D.   On: 06/03/2020 18:04   CT Angio Chest PE W and/or Wo Contrast  Result Date: 06/04/2020 CLINICAL DATA:  Positive D-dimer.  Shortness of breath. EXAM: CT ANGIOGRAPHY CHEST WITH CONTRAST TECHNIQUE: Multidetector CT imaging of the chest was performed using the standard protocol during bolus administration of intravenous contrast. Multiplanar CT image reconstructions and MIPs were obtained to evaluate the vascular anatomy. CONTRAST:  169mL OMNIPAQUE IOHEXOL 350 MG/ML SOLN COMPARISON:  Chest radiography earlier same day. Chest CT 11/26/2019. FINDINGS: Cardiovascular: Previous median sternotomy and CABG. Cardiomegaly. Some calcification of the anterior wall the left ventricle. Thoracic aortic atherosclerotic calcification. Pulmonary arterial opacification is centrally and in the upper lobes. There is diminished opacification of the lower lobe pulmonary arteries that I favor is due to bolus timing. Lower lobe pulmonary arterial branches are not opacified, but this is bilateral and symmetric and there does not appear to be a true filling defect, therefore I do not think that this appearance is due to embolic disease based on any positive finding. Of course there could be a small embolus in the unopacified vessels. All opacified vessels are free of emboli. Mediastinum/Nodes: No mass or adenopathy. Changes related to previous CABG. Sternal nonunion. Lungs/Pleura: Bilateral pleural effusions layering dependently. Dependent pulmonary atelectasis, most pronounced in the lower lobes. Mild interstitial edema pattern. Upper Abdomen: No  acute finding. No significant upper abdominal pathology. Musculoskeletal: Minimal thoracic degenerative changes. Sternal nonunion as noted above. Review of the MIP images confirms the above findings. IMPRESSION: 1. Cardiomegaly. Previous median sternotomy and CABG. Some calcification of the anterior wall of the left ventricle. 2. Bilateral pleural effusions layering dependently. Dependent pulmonary atelectasis, most pronounced in the lower lobes. Mild interstitial edema pattern. Findings are consistent with congestive heart failure. 3. Lower lobe pulmonary arterial opacification is poor, but I favor that this is due to bolus timing. I do not think this appearance is due to embolic disease based on any positive finding. Of course there could be a small embolus in the unopacified vessels of the lower lobes. There is no positive sign of embolic disease in  any opacified vessel. 4. Aortic atherosclerosis. Aortic Atherosclerosis (ICD10-I70.0). Electronically Signed   By: Nelson Chimes M.D.   On: 06/04/2020 00:09   ECHOCARDIOGRAM COMPLETE  Result Date: 06/04/2020    ECHOCARDIOGRAM REPORT   Patient Name:   [name] Rodocker Date of Exam: 06/04/2020 Medical Rec #:  [id number]         Height:       67.0 in Accession #:    [id number]        Weight:       185.0 lb Date of Birth:  11/17/1960         BSA:          1.956 m Patient Age:    26 years          BP:           153/95 mmHg Patient Gender: M                 HR:           61 bpm. Exam Location:  Inpatient Procedure: 2D Echo Indications:    acute systolic chf  History:        Patient has prior history of Echocardiogram examinations, most                 recent 12/01/2019. Prior CABG, Signs/Symptoms:Shortness of                 Breath; Risk Factors:Hypertension, Diabetes and Dyslipidemia.  Sonographer:    Johny Chess Referring Phys: [id number] [place]  1. Left ventricular ejection fraction, by estimation, is 25 to 30%. The left ventricle has severely  decreased function. The left ventricle demonstrates regional wall motion abnormalities (see scoring diagram/findings for description). There is mild concentric left ventricular hypertrophy. Left ventricular diastolic parameters are consistent with Grade III diastolic dysfunction (restrictive).  2. Right ventricular systolic function is moderately reduced. The right ventricular size is normal. There is normal pulmonary artery systolic pressure. The estimated right ventricular systolic pressure is 35.5 mmHg.  3. Left atrial size was severely dilated.  4. The mitral valve is normal in structure. No evidence of mitral valve regurgitation. No evidence of mitral stenosis.  5. Tricuspid regurgitation is eccentric; mild but may be underestimated in this study.  6. The aortic valve is grossly normal. Aortic valve regurgitation is not visualized.  7. The inferior vena cava is normal in size with greater than 50% respiratory variability, suggesting right atrial pressure of 3 mmHg. Comparison(s): A prior study was performed on 12/01/19. Prior images reviewed side by side. Pleural effusion has resolved, right ventricular function is worse, left ventricular function is similar. FINDINGS  Left Ventricle: There is no LV thrombus. Left ventricular ejection fraction, by estimation, is 25 to 30%. The left ventricle has severely decreased function. The left ventricle demonstrates regional wall motion abnormalities. The left ventricular internal cavity size was normal in size. There is mild concentric left ventricular hypertrophy. Left ventricular diastolic parameters are consistent with Grade III diastolic dysfunction (restrictive).  LV Wall Scoring: The mid and distal anterior wall, entire apex, mid and distal inferior wall, mid anterolateral segment, and mid inferoseptal segment are hypokinetic. Right Ventricle: The right ventricular size is normal. No increase in right ventricular wall thickness. Right ventricular systolic function  is moderately reduced. There is normal pulmonary artery systolic pressure. The tricuspid regurgitant velocity is 2.62 m/s, and with an assumed right atrial pressure of 3 mmHg, the estimated right ventricular systolic pressure  is 30.5 mmHg. Left Atrium: Left atrial size was severely dilated. Right Atrium: Right atrial size was normal in size. Pericardium: There is no evidence of pericardial effusion. Mitral Valve: The mitral valve is normal in structure. No evidence of mitral valve regurgitation. No evidence of mitral valve stenosis. Tricuspid Valve: Tricuspid regurgitation is eccentric; mild but may be underestimated in this study. The tricuspid valve is not well visualized. Tricuspid valve regurgitation is mild. Aortic Valve: The aortic valve is grossly normal. There is mild aortic valve annular calcification. Aortic valve regurgitation is not visualized. Pulmonic Valve: The pulmonic valve was grossly normal. Pulmonic valve regurgitation is not visualized. Aorta: The aortic root, ascending aorta and aortic arch are all structurally normal, with no evidence of dilitation or obstruction. Venous: The inferior vena cava is normal in size with greater than 50% respiratory variability, suggesting right atrial pressure of 3 mmHg. IAS/Shunts: The atrial septum is grossly normal.  LEFT VENTRICLE PLAX 2D LVIDd:         5.20 cm      Diastology LVIDs:         4.20 cm      LV e' medial:    3.44 cm/s LV PW:         1.10 cm      LV E/e' medial:  30.5 LV IVS:        1.10 cm      LV e' lateral:   4.67 cm/s LVOT diam:     1.80 cm      LV E/e' lateral: 22.5 LV SV:         37 LV SV Index:   19 LVOT Area:     2.54 cm  LV Volumes (MOD) LV vol d, MOD A2C: 175.0 ml LV vol d, MOD A4C: 138.0 ml LV vol s, MOD A2C: 110.0 ml LV vol s, MOD A4C: 90.8 ml LV SV MOD A2C:     65.0 ml LV SV MOD A4C:     138.0 ml LV SV MOD BP:      58.3 ml RIGHT VENTRICLE         IVC TAPSE (M-mode): 1.0 cm  IVC diam: 1.90 cm LEFT ATRIUM             Index       RIGHT  ATRIUM           Index LA diam:        4.40 cm 2.25 cm/m  RA Area:     18.30 cm LA Vol (A2C):   93.6 ml 47.84 ml/m RA Volume:   50.60 ml  25.86 ml/m LA Vol (A4C):   78.1 ml 39.92 ml/m LA Biplane Vol: 88.7 ml 45.34 ml/m  AORTIC VALVE LVOT Vmax:   90.60 cm/s LVOT Vmean:  58.100 cm/s LVOT VTI:    0.147 m  AORTA Ao Root diam: 3.10 cm Ao Asc diam:  3.50 cm MITRAL VALVE                TRICUSPID VALVE MV Area (PHT): 5.54 cm     TR Peak grad:   27.5 mmHg MV Decel Time: 137 msec     TR Vmax:        262.00 cm/s MV E velocity: 105.00 cm/s MV A velocity: 24.50 cm/s   SHUNTS MV E/A ratio:  4.29         Systemic VTI:  0.15 m  Systemic Diam: 1.80 cm Rudean Haskell MD Electronically signed by Rudean Haskell MD Signature Date/Time: 06/04/2020/4:33:11 PM    Final     Subjective: Eager to go home  Discharge Exam: Vitals:   06/06/20 1039 06/06/20 1113  BP: (!) 145/92 138/84  Pulse: 79 63  Resp:  18  Temp:  98.6 F (37 C)  SpO2: 99% 93%   Vitals:   06/05/20 1938 06/06/20 0722 06/06/20 1039 06/06/20 1113  BP: 119/78 134/83 (!) 145/92 138/84  Pulse: 62 62 79 63  Resp: 20 16  18   Temp: 98 F (36.7 C) 97.7 F (36.5 C)  98.6 F (37 C)  TempSrc: Oral Oral  Oral  SpO2: 97% (!) 85% 99% 93%  Weight:  90.4 kg    Height:        General: Pt is alert, awake, not in acute distress Cardiovascular: RRR, S1/S2 +, no rubs, no gallops Respiratory: CTA bilaterally, no wheezing, no rhonchi Abdominal: Soft, NT, ND, bowel sounds + Extremities: no edema, no cyanosis   The results of significant diagnostics from this hospitalization (including imaging, microbiology, ancillary and laboratory) are listed below for reference.     Microbiology: Recent Results (from the past 240 hour(s))  Resp Panel by RT-PCR (Flu A&B, Covid) Nasopharyngeal Swab     Status: None   Collection Time: 06/03/20  9:11 PM   Specimen: Nasopharyngeal Swab; Nasopharyngeal(NP) swabs in vial transport  medium  Result Value Ref Range Status   SARS Coronavirus 2 by RT PCR NEGATIVE NEGATIVE Final    Comment: (NOTE) SARS-CoV-2 target nucleic acids are NOT DETECTED.  The SARS-CoV-2 RNA is generally detectable in upper respiratory specimens during the acute phase of infection. The lowest concentration of SARS-CoV-2 viral copies this assay can detect is 138 copies/mL. A negative result does not preclude SARS-Cov-2 infection and should not be used as the sole basis for treatment or other patient management decisions. A negative result may occur with  improper specimen collection/handling, submission of specimen other than nasopharyngeal swab, presence of viral mutation(s) within the areas targeted by this assay, and inadequate number of viral copies(<138 copies/mL). A negative result must be combined with clinical observations, patient history, and epidemiological information. The expected result is Negative.  Fact Sheet for Patients:  EntrepreneurPulse.com.au  Fact Sheet for Healthcare Providers:  IncredibleEmployment.be  This test is no t yet approved or cleared by the [place] FDA and  has been authorized for detection and/or diagnosis of SARS-CoV-2 by FDA under an Emergency Use Authorization (EUA). This EUA will remain  in effect (meaning this test can be used) for the duration of the COVID-19 declaration under Section 564(b)(1) of the Act, 21 U.S.C.section 360bbb-3(b)(1), unless the authorization is terminated  or revoked sooner.       Influenza A by PCR NEGATIVE NEGATIVE Final   Influenza B by PCR NEGATIVE NEGATIVE Final    Comment: (NOTE) The Xpert Xpress SARS-CoV-2/FLU/RSV plus assay is intended as an aid in the diagnosis of influenza from Nasopharyngeal swab specimens and should not be used as a sole basis for treatment. Nasal washings and aspirates are unacceptable for Xpert Xpress SARS-CoV-2/FLU/RSV testing.  Fact Sheet for  Patients: EntrepreneurPulse.com.au  Fact Sheet for Healthcare Providers: IncredibleEmployment.be  This test is not yet approved or cleared by the [place] FDA and has been authorized for detection and/or diagnosis of SARS-CoV-2 by FDA under an Emergency Use Authorization (EUA). This EUA will remain in effect (meaning this test can be used) for the duration  of the COVID-19 declaration under Section 564(b)(1) of the Act, 21 U.S.C. section 360bbb-3(b)(1), unless the authorization is terminated or revoked.  Performed at [facility], [place] [address] [place], [place] [address]      Labs: BNP (last 3 results) Recent Labs    10/24/19 2012 06/03/20 1730  BNP 1,913.6* 1,275.1*   Basic Metabolic Panel: Recent Labs  Lab 06/03/20 1729 06/05/20 0330 06/06/20 0301  NA 142 141 138  K 4.1 4.0 4.0  CL 102 103 104  CO2 27 28 25   GLUCOSE 98 147* 131*  BUN 17 19 20   CREATININE 1.04 1.34* 1.07  CALCIUM 8.9 8.8* 8.6*   Liver Function Tests: No results for input(s): AST, ALT, ALKPHOS, BILITOT, PROT, ALBUMIN in the last 168 hours. No results for input(s): LIPASE, AMYLASE in the last 168 hours. No results for input(s): AMMONIA in the last 168 hours. CBC: Recent Labs  Lab 06/03/20 1729 06/03/20 2200  WBC 9.3 9.8  NEUTROABS  --  6.3  HGB 15.0 16.7  HCT 48.9 52.0  MCV 91.2 89.7  PLT 359 401*   Cardiac Enzymes: No results for input(s): CKTOTAL, CKMB, CKMBINDEX, TROPONINI in the last 168 hours. BNP: Invalid input(s): POCBNP CBG: Recent Labs  Lab 06/05/20 1136 06/05/20 1527 06/05/20 2142 06/06/20 0610 06/06/20 1114  GLUCAP 100* 102* 139* 121* 143*   D-Dimer Recent Labs    06/03/20 2155  DDIMER 0.92*   Hgb A1c Recent Labs    06/04/20 1433  HGBA1C 8.0*   Lipid Profile No results for input(s): CHOL, HDL, LDLCALC, TRIG, CHOLHDL, LDLDIRECT in the last 72 hours. Thyroid function studies No results for input(s): TSH,  T4TOTAL, T3FREE, THYROIDAB in the last 72 hours.  Invalid input(s): FREET3 Anemia work up No results for input(s): VITAMINB12, FOLATE, FERRITIN, TIBC, IRON, RETICCTPCT in the last 72 hours. Urinalysis    Component Value Date/Time   COLORURINE YELLOW 11/26/2019 1932   APPEARANCEUR CLEAR 11/26/2019 1932   LABSPEC 1.022 11/26/2019 1932   PHURINE 8.0 11/26/2019 1932   GLUCOSEU >=500 (A) 11/26/2019 1932   HGBUR NEGATIVE 11/26/2019 1932   BILIRUBINUR NEGATIVE 11/26/2019 1932   KETONESUR NEGATIVE 11/26/2019 1932   PROTEINUR 100 (A) 11/26/2019 1932   UROBILINOGEN 0.2 01/30/2011 1501   NITRITE NEGATIVE 11/26/2019 1932   LEUKOCYTESUR NEGATIVE 11/26/2019 1932   Sepsis Labs Invalid input(s): PROCALCITONIN,  WBC,  LACTICIDVEN Microbiology Recent Results (from the past 240 hour(s))  Resp Panel by RT-PCR (Flu A&B, Covid) Nasopharyngeal Swab     Status: None   Collection Time: 06/03/20  9:11 PM   Specimen: Nasopharyngeal Swab; Nasopharyngeal(NP) swabs in vial transport medium  Result Value Ref Range Status   SARS Coronavirus 2 by RT PCR NEGATIVE NEGATIVE Final    Comment: (NOTE) SARS-CoV-2 target nucleic acids are NOT DETECTED.  The SARS-CoV-2 RNA is generally detectable in upper respiratory specimens during the acute phase of infection. The lowest concentration of SARS-CoV-2 viral copies this assay can detect is 138 copies/mL. A negative result does not preclude SARS-Cov-2 infection and should not be used as the sole basis for treatment or other patient management decisions. A negative result may occur with  improper specimen collection/handling, submission of specimen other than nasopharyngeal swab, presence of viral mutation(s) within the areas targeted by this assay, and inadequate number of viral copies(<138 copies/mL). A negative result must be combined with clinical observations, patient history, and epidemiological information. The expected result is Negative.  Fact Sheet for  Patients:  EntrepreneurPulse.com.au  Fact  Sheet for Healthcare Providers:  IncredibleEmployment.be  This test is no t yet approved or cleared by the [place] FDA and  has been authorized for detection and/or diagnosis of SARS-CoV-2 by FDA under an Emergency Use Authorization (EUA). This EUA will remain  in effect (meaning this test can be used) for the duration of the COVID-19 declaration under Section 564(b)(1) of the Act, 21 U.S.C.section 360bbb-3(b)(1), unless the authorization is terminated  or revoked sooner.       Influenza A by PCR NEGATIVE NEGATIVE Final   Influenza B by PCR NEGATIVE NEGATIVE Final    Comment: (NOTE) The Xpert Xpress SARS-CoV-2/FLU/RSV plus assay is intended as an aid in the diagnosis of influenza from Nasopharyngeal swab specimens and should not be used as a sole basis for treatment. Nasal washings and aspirates are unacceptable for Xpert Xpress SARS-CoV-2/FLU/RSV testing.  Fact Sheet for Patients: EntrepreneurPulse.com.au  Fact Sheet for Healthcare Providers: IncredibleEmployment.be  This test is not yet approved or cleared by the [place] FDA and has been authorized for detection and/or diagnosis of SARS-CoV-2 by FDA under an Emergency Use Authorization (EUA). This EUA will remain in effect (meaning this test can be used) for the duration of the COVID-19 declaration under Section 564(b)(1) of the Act, 21 U.S.C. section 360bbb-3(b)(1), unless the authorization is terminated or revoked.  Performed at [facility], [place] [address], [place], [place] [address]    Time spent: 58min  SIGNED:   Marylu Lund, MD  [facility] 06/06/2020, 6:38 PM  If 7PM-7AM, please contact night-coverage

## 2020-06-07 NOTE — Progress Notes (Addendum)
[facility]  PCP: Concepcion Elk Primary Cardiologist:Crenshaw, Aaron Edelman  HPI:  [name] is a 60 y.o.  male  with a PMH significant for HFrEF last EF 30%, CAD status post CABG in 2021, PAF, Prolonged QT interval, T2DM, Hyperlipidemia   Patient's cardiac history began with an ST elevation myocardial infarction in 2005 with percutaneous intervention to diagonal, right coronary artery totaled.  Later had a Non-ST segment elevation myocardial infarction August 2007 with bare-metal stent to the obtuse marginal 2, at that time his ejection fraction was 35-40%  Admitted for CHF exacerbation on 10/2019 had a repeat echo which showed severely reduced EF, also had a repeat R/LHC at that time:   Prox RCA lesion is 100% stenosed.  2nd Mrg lesion is 80% stenosed.  Prox Cx to Mid Cx lesion is 80% stenosed.  Ramus lesion is 70% stenosed.  2nd Diag lesion is 90% stenosed.  Mid LAD lesion is 99% stenosed.   1. Severe triple vessel CAD 2. Severe stenosis mid LAD 3. Severe stenosis moderate caliber Diagonal branch 4. Severe stenosis mid Circumflex into the stented segment of the OM branch 5. Severe stenosis moderate caliber intermediate branch 6. Chronic occlusion proximal RCA. The distal vessel fills from right to right and left to right collaterals.  7. Elevated filling pressures.   Given LHC findings he was Evaluated for CABG and felt to be a good candidate, this was performed x3 vessels: Left Internal Mammary Artery to Distal Left Anterior Descending Coronary Artery, pedicled RIMA Graft to Posterior Descending Coronary Artery; left radial artery Graft to Obtuse Marginal Branch of Left Circumflex Coronary Artery  Was out of his medications for months after CABG, says he was never scheduled for follow up with cardiology after his CT surgery appointments and follow up for sternal wound infection.  Recently admitted 2/11 for CHF exacerbation, diuresed and started back on GDMT.     Since d/c from the hospital his breathing is much improved.  He denies any chest pain, orthopnea or PND.  He has not been weighing himself but does have a scale.     ROS: All systems negative except as listed in HPI, PMH and Problem List.  SH:  Social History   Socioeconomic History  . Marital status: Legally Separated    Spouse name: Not on file  . Number of children: 3  . Years of education: Not on file  . Highest education level: Not on file  Occupational History    Comment: Unemployed  Tobacco Use  . Smoking status: Former Smoker    Packs/day: 0.25    Years: 25.00    Pack years: 6.25    Types: Cigarettes    Start date: 67    Quit date: 10/23/2019    Years since quitting: 0.6  . Smokeless tobacco: Never Used  . Tobacco comment: 1/4 pack per day  Vaping Use  . Vaping Use: Never used  Substance and Sexual Activity  . Alcohol use: No    Alcohol/week: 0.0 standard drinks  . Drug use: No  . Sexual activity: Not on file  Other Topics Concern  . Not on file  Social History Narrative  . Not on file   Social Determinants of Health   Financial Resource Strain: Low Risk   . Difficulty of Paying Living Expenses: Not very hard  Food Insecurity: No Food Insecurity  . Worried About Charity fundraiser in the Last Year: Never true  . Ran Out of Food in  the Last Year: Never true  Transportation Needs: No Transportation Needs  . Lack of Transportation (Medical): No  . Lack of Transportation (Non-Medical): No  Physical Activity: Insufficiently Active  . Days of Exercise per Week: 1 day  . Minutes of Exercise per Session: 30 min  Stress: Not on file  Social Connections: Socially Isolated  . Frequency of Communication with Friends and Family: More than three times a week  . Frequency of Social Gatherings with Friends and Family: More than three times a week  . Attends Religious Services: Never  . Active Member of Clubs or Organizations: No  . Attends Archivist  Meetings: Never  . Marital Status: Divorced  Human resources officer Violence: Not on file    FH:  Family History  Problem Relation Age of Onset  . Diabetes Father   . Kidney failure Father   . Hypertension Father   . Hyperlipidemia Father   . Heart disease Father     Past Medical History:  Diagnosis Date  . Anxiety   . CAD (coronary artery disease)   . Chronic back pain   . Colon polyps   . Diabetes mellitus   . Heart attack ([place])   . Hyperlipidemia   . Hypertension   . Panic attacks   . PVD (peripheral vascular disease) ([place])     Current Outpatient Medications  Medication Sig Dispense Refill  . amLODipine (NORVASC) 10 MG tablet Take 1 tablet (10 mg total) by mouth daily. 30 tablet 0  . apixaban (ELIQUIS) 5 MG TABS tablet Take 1 tablet (5 mg total) by mouth 2 (two) times daily. 60 tablet 0  . atorvastatin (LIPITOR) 40 MG tablet Take 1 tablet (40 mg total) by mouth daily. 30 tablet 0  . clopidogrel (PLAVIX) 75 MG tablet Take 1 tablet (75 mg total) by mouth daily. 30 tablet 0  . empagliflozin (JARDIANCE) 25 MG TABS tablet Take 1 tablet (25 mg total) by mouth daily. 30 tablet 0  . furosemide (LASIX) 40 MG tablet Take 1 tablet (40 mg total) by mouth daily. 30 tablet 0  . losartan (COZAAR) 100 MG tablet Take 1 tablet (100 mg total) by mouth daily. 30 tablet 0  . metoprolol succinate (TOPROL-XL) 25 MG 24 hr tablet Take 1 tablet (25 mg total) by mouth daily. 30 tablet 0  . Multiple Vitamins-Minerals (CENTRUM SILVER) tablet Take 1 tablet by mouth 2 (two) times a week.    . OXYGEN Inhale 2-3 L/min into the lungs at bedtime as needed (for shortness of breath).    . potassium chloride (KLOR-CON) 10 MEQ tablet Take 1 tablet (10 mEq total) by mouth daily. 30 tablet 0   No current facility-administered medications for this encounter.    Vitals:   06/08/20 1006  BP: 138/86  Pulse: 61  SpO2: 97%  Weight: 91.2 kg    PHYSICAL EXAM: Cardiac: JVD flat, normal rate and rhythm, clear s1  and s2, no murmurs, rubs or gallops, no LE edema Pulmonary: CTAB, not in distress Abdominal: non distended abdomen, soft and nontender Psych: Alert, conversant, in good spirits  ASSESSMENT & PLAN:  Combined Systolic and Diastolic CHF, Biventricular Failure: -Ischemic Cardiomyopathy s/p CABG, Off GDMT for 4 months -NYHA Class II symptoms -Current GDMT:  Losartan 100mg ,  metoprolol succinate 25mg ,  Jardiance 25mg  -Swith losartan to entresto -needs spiro next visit, consider transitioning off amlodipine to make room for GDMT -euvolemic maintaining on lasix 40mg  can continue  PH: -primarily group 2, will need repeat RHC  PAF: -CHADS2VASC of 4 -NSR as of 2 days ago appears to remain in NSR based on examination -Continue eliquis  CAD: -status post CABG in 2021  T2DM: -continue jardiance  Hyperlipidemia:  -continue lipitor 40mg     Follow up with general cardiology

## 2020-06-08 ENCOUNTER — Ambulatory Visit (HOSPITAL_COMMUNITY)
Admit: 2020-06-08 | Discharge: 2020-06-08 | Disposition: A | Discharge status: Home / Self Care | Payer: Medicaid Other | Attending: Internal Medicine | Admitting: Internal Medicine

## 2020-06-08 ENCOUNTER — Other Ambulatory Visit: Payer: Self-pay

## 2020-06-08 ENCOUNTER — Telehealth (HOSPITAL_COMMUNITY): Payer: Self-pay

## 2020-06-08 VITALS — BP 138/86 | HR 61 | Wt 201.0 lb

## 2020-06-08 DIAGNOSIS — Z87891 Personal history of nicotine dependence: Secondary | ICD-10-CM | POA: Diagnosis not present

## 2020-06-08 DIAGNOSIS — Z8249 Family history of ischemic heart disease and other diseases of the circulatory system: Secondary | ICD-10-CM | POA: Insufficient documentation

## 2020-06-08 DIAGNOSIS — I252 Old myocardial infarction: Secondary | ICD-10-CM | POA: Insufficient documentation

## 2020-06-08 DIAGNOSIS — E785 Hyperlipidemia, unspecified: Secondary | ICD-10-CM | POA: Diagnosis not present

## 2020-06-08 DIAGNOSIS — I480 Paroxysmal atrial fibrillation: Secondary | ICD-10-CM | POA: Diagnosis not present

## 2020-06-08 DIAGNOSIS — I255 Ischemic cardiomyopathy: Secondary | ICD-10-CM | POA: Diagnosis not present

## 2020-06-08 DIAGNOSIS — E1151 Type 2 diabetes mellitus with diabetic peripheral angiopathy without gangrene: Secondary | ICD-10-CM | POA: Insufficient documentation

## 2020-06-08 DIAGNOSIS — I2510 Atherosclerotic heart disease of native coronary artery without angina pectoris: Secondary | ICD-10-CM | POA: Insufficient documentation

## 2020-06-08 DIAGNOSIS — Z79899 Other long term (current) drug therapy: Secondary | ICD-10-CM | POA: Diagnosis not present

## 2020-06-08 DIAGNOSIS — Z951 Presence of aortocoronary bypass graft: Secondary | ICD-10-CM | POA: Diagnosis not present

## 2020-06-08 DIAGNOSIS — I110 Hypertensive heart disease with heart failure: Secondary | ICD-10-CM | POA: Insufficient documentation

## 2020-06-08 DIAGNOSIS — E7800 Pure hypercholesterolemia, unspecified: Secondary | ICD-10-CM | POA: Diagnosis not present

## 2020-06-08 DIAGNOSIS — I5082 Biventricular heart failure: Secondary | ICD-10-CM | POA: Diagnosis not present

## 2020-06-08 DIAGNOSIS — Z7901 Long term (current) use of anticoagulants: Secondary | ICD-10-CM | POA: Insufficient documentation

## 2020-06-08 DIAGNOSIS — I5040 Unspecified combined systolic (congestive) and diastolic (congestive) heart failure: Secondary | ICD-10-CM | POA: Diagnosis present

## 2020-06-08 MED ORDER — ENTRESTO 49-51 MG PO TABS: 1.0000 | ORAL_TABLET | Freq: Two times a day (BID) | ORAL | 3 refills | Status: DC

## 2020-06-08 NOTE — Progress Notes (Signed)
[facility]  Heart Failure Pharmacist Encounter  HPI:  60 yo M with PMH of CHF, CABG in 2021, T2DM, HTN, and HLD. He was admitted on 06/03/20 with acute on chronic HFrEF. He was not taking his PTA medications for about a year because he did not request refills of his medications. He was then discharged on 06/06/20.  Today, [name] presents to the [facility] for follow up. He denies having shortness of breath, orthopnea/PND, or edema. He said he becomes lightheaded only when he stands up quickly. He has not been taking his weight at home but has two scales he can use. He has not been checking his BP at home. He has been following a low sodium and fluid-restricted diet. He has been taking MOST medications as prescribed, however, he did not have ANY Eliquis tablets in his pill box and the bottle from [facility] was unopened.   HF Medications: Furosemide 40 mg daily Metoprolol XL 25 mg daily Losartan 100 mg daily Jardiance 25 mg daily *also on amlodipine 10 mg daily  Has the patient been experiencing any side effects to the medications prescribed?  no  Does the patient have any problems obtaining medications due to transportation or finances?   no  Understanding of regimen: fair Understanding of indications: fair Potential of compliance: fair Patient understands to avoid NSAIDs. Patient understands to avoid decongestants.   Pertinent Lab Values: . Serum creatinine 1.07, BUN 20, Potassium 4.0, Sodium 138, BNP 1905.8   Vital Signs: . Weight: 201 lbs (discharge weight: 199 lbs) . Blood pressure: 138/86 mmHg  . Heart rate: 61 bpm   Medication Assistance / Insurance Benefits Check: Does the patient have prescription insurance?  Yes Type of insurance plan: [place] Medicaid  Outpatient Pharmacy:  Current outpatient pharmacy: [facility] Was the [facility] pharmacy used to supply discharge medications? yes  If TOC pharmacy was used, were the refills transferred  out to current pharmacy yet? no - no refills on meds Is the patient willing to transition their outpatient pharmacy to utilize a [facility] outpatient pharmacy with or without mail order?   No - prefers [facility]  Assessment: 1) Chronic systolic CHF (EF 14-43%), due to ICM. NYHA class II symptoms. - Continue furosemide 40 mg daily - Continue metoprolol XL 25 mg daily - Stop losartan 100 mg daily - Start Entresto 49/51 mg BID - Continue Jardiance 25 mg daily - Continue amlodipine 10 mg daily for now - would like to eventually replace this with spironolactone given no benefit of amlodipine (and increased risk of swelling) in patients with HFrEF.  Plan: 1) Medication changes: - Stop losartan 100 mg daily - Start Entresto 49/51 mg BID  2) Patient Assistance: - Prior authorization required for Entresto. Will complete this today. - Patient provided with free 30-day card for [facility] while prior authorization is in process  3) Follow up: - Next appointment with Kerin Ransom on 06/13/20  Kerby Nora, PharmD, [facility] Pharmacist [phone]

## 2020-06-08 NOTE — Patient Instructions (Signed)
Stop Losartan  Start Entresto 49/51 mg Twice daily, starting Thursday 2/17 AM  Thank you for allowing [place] to provider your heart failure care after your recent hospitalization. Please follow-up with [facility] as scheduled

## 2020-06-08 NOTE — Telephone Encounter (Signed)
Heart Failure Patient Advocate Encounter  Received notification from [employer] that prior authorization for Delene Loll is required.  PA submitted on CoverMyMeds Key [id number] Status is approved from 06/08/20-06/08/21.   Kerby Nora, PharmD, BCPS Heart Failure [facility] Pharmacist [phone]

## 2020-06-08 NOTE — Progress Notes (Signed)
Heart Failure Patient Advocate Encounter   Received notification from [employer] that prior authorization for Delene Loll is required.   PA submitted on CoverMyMeds Key [id number] Status is pending   Will continue to follow.  Kerby Nora, PharmD, BCPS Heart Failure [facility] Pharmacist [phone]

## 2020-06-13 ENCOUNTER — Other Ambulatory Visit: Payer: Self-pay

## 2020-06-13 ENCOUNTER — Encounter: Payer: Self-pay | Admitting: Cardiology

## 2020-06-13 ENCOUNTER — Ambulatory Visit (INDEPENDENT_AMBULATORY_CARE_PROVIDER_SITE_OTHER): Payer: Medicaid Other | Admitting: Cardiology

## 2020-06-13 VITALS — BP 122/72 | HR 66 | Ht 67.0 in | Wt 197.8 lb

## 2020-06-13 DIAGNOSIS — I5023 Acute on chronic systolic (congestive) heart failure: Secondary | ICD-10-CM | POA: Diagnosis not present

## 2020-06-13 DIAGNOSIS — Z951 Presence of aortocoronary bypass graft: Secondary | ICD-10-CM

## 2020-06-13 DIAGNOSIS — I480 Paroxysmal atrial fibrillation: Secondary | ICD-10-CM

## 2020-06-13 DIAGNOSIS — I10 Essential (primary) hypertension: Secondary | ICD-10-CM

## 2020-06-13 DIAGNOSIS — I255 Ischemic cardiomyopathy: Secondary | ICD-10-CM | POA: Diagnosis not present

## 2020-06-13 MED ORDER — METOPROLOL SUCCINATE ER 25 MG PO TB24: 25.0000 mg | ORAL_TABLET | Freq: Every day | ORAL | 3 refills | Status: DC

## 2020-06-13 MED ORDER — APIXABAN 5 MG PO TABS: 5.0000 mg | ORAL_TABLET | Freq: Two times a day (BID) | ORAL | 3 refills | Status: DC

## 2020-06-13 MED ORDER — ATORVASTATIN CALCIUM 40 MG PO TABS: 40.0000 mg | ORAL_TABLET | Freq: Every day | ORAL | 3 refills | Status: DC

## 2020-06-13 MED ORDER — CLOPIDOGREL BISULFATE 75 MG PO TABS: 75.0000 mg | ORAL_TABLET | Freq: Every day | ORAL | 3 refills | Status: AC

## 2020-06-13 MED ORDER — FUROSEMIDE 40 MG PO TABS: 40.0000 mg | ORAL_TABLET | Freq: Every day | ORAL | 3 refills | Status: DC

## 2020-06-13 MED ORDER — AMLODIPINE BESYLATE 10 MG PO TABS: 10.0000 mg | ORAL_TABLET | Freq: Every day | ORAL | 3 refills | Status: DC

## 2020-06-13 NOTE — Assessment & Plan Note (Signed)
EF 25-30%- Feb 2022 (off medications)

## 2020-06-13 NOTE — Assessment & Plan Note (Signed)
Controlled.  

## 2020-06-13 NOTE — Assessment & Plan Note (Signed)
No significant weight change but symptomatically improved. Check BNP

## 2020-06-13 NOTE — Assessment & Plan Note (Signed)
Post CABG- NSR- on Eliquis

## 2020-06-13 NOTE — Patient Instructions (Signed)
Medication Instructions:  Continue current medications  *If you need a refill on your cardiac medications before your next appointment, please call your pharmacy*   Lab Work: None Ordered   Testing/Procedures: None Ordered   Follow-Up: At [facility], you and your health needs are our priority.  As part of our continuing mission to provide you with exceptional heart care, we have created designated Provider Care Teams.  These Care Teams include your primary Cardiologist (physician) and Advanced Practice Providers (APPs -  Physician Assistants and Nurse Practitioners) who all work together to provide you with the care you need, when you need it.  We recommend signing up for the patient portal called "MyChart".  Sign up information is provided on this After Visit Summary.  MyChart is used to connect with patients for Virtual Visits (Telemedicine).  Patients are able to view lab/test results, encounter notes, upcoming appointments, etc.  Non-urgent messages can be sent to your provider as well.   To learn more about what you can do with MyChart, go to [email or web address].    Your next appointment:   2 month(s)  The format for your next appointment:   In Person  Provider:   You may see Kirk Ruths, MD or one of the following Advanced Practice Providers on your designated Care Team:    Kerin Ransom, PA-C  [place], [place]  Coletta Memos, [place]

## 2020-06-13 NOTE — Progress Notes (Signed)
Cardiology Office Note:    Date:  06/13/2020   ID:  [name], DOB 1960/11/18, MRN [id number]  PCP:  Concepcion Elk, MD  Cardiologist:  Kirk Ruths, MD  Electrophysiologist:  None   Referring MD: Concepcion Elk, MD   No chief complaint on file. post hospital follow up  History of Present Illness:    [name] is a 60 y.o. male with a hx of coronary disease, status post PCI in 2005 and 2007.  He was admitted 10/26/2019 with anasarca.  He had shortness of breath and scrotal edema.  Echocardiogram showed ejection fraction of 30 to 35% with grade 3 diastolic dysfunction.  He underwent diagnostic catheterization which revealed significant progression in his coronary disease with an occluded RCA, 80% circumflex, 99% mid LAD, 90% diagonal, 80% OM stent in-stent restenosis and 90% diagonal.  He underwent CABG x2 with an LIMA to LAD and RIMA to PDA on 10/29/2019.  Postop he had atrial fibrillation and was loaded with amiodarone and started on Eliquis.  The patient was volume overloaded and diuresed 23 L that admission as well as a right thoracentesis on 10/27/2019.  He was re admitted in August 2021 for sternal wound infection that required debridement and a wound vac.  This eventually healed. Unfortunately he never followed up with cardiology after that.  He eventually ran out of all his medications and says he didn't think he needed them long term.  He was admitted 06/03/2020 with acute CHF.  His EKG showed NSR.  He was diuresed on the Hospitalist service (not seen by cardiology).  He is in the office today for follow up.  He is doing well, tolerating his medications. His weight is still up c/w Oct 2021 when he was 167 lbs, he is 199 lbs today.   Past Medical History:  Diagnosis Date  . Anxiety   . CAD (coronary artery disease)   . Chronic back pain   . Colon polyps   . Diabetes mellitus   . Heart attack ([place])   . Hyperlipidemia   . Hypertension   . Panic attacks   . PVD (peripheral  vascular disease) ([place])     Past Surgical History:  Procedure Laterality Date  . APPLICATION OF A-CELL OF BACK N/A 12/09/2019   Procedure: APPLICATION OF A-CELL OF BACK;  Surgeon: Wonda Olds, MD;  Location: [facility];  Service: Thoracic;  Laterality: N/A;  . APPLICATION OF A-CELL OF CHEST/ABDOMEN N/A 12/04/2019   Procedure: APPLICATION OF A-CELL OF CHEST;  Surgeon: Wonda Olds, MD;  Location: [facility];  Service: Thoracic;  Laterality: N/A;  . APPLICATION OF WOUND VAC N/A 11/27/2019   Procedure: APPLICATION OF WOUND VAC;  Surgeon: Ivin Poot, MD;  Location: [place];  Service: Cardiovascular;  Laterality: N/A;  Lower midline superficial sternal wound area.  . APPLICATION OF WOUND VAC N/A 12/09/2019   Procedure: WOUND VAC EXCHANGE;  Surgeon: Wonda Olds, MD;  Location: [place];  Service: Thoracic;  Laterality: N/A;  . APPLICATION OF WOUND VAC N/A 12/14/2019   Procedure: WOUND VAC CHANGE;  Surgeon: Wonda Olds, MD;  Location: [place];  Service: Thoracic;  Laterality: N/A;  . CORONARY ANGIOPLASTY WITH STENT PLACEMENT    . CORONARY ARTERY BYPASS GRAFT N/A 10/29/2019   Procedure: CORONARY ARTERY BYPASS GRAFTING (CABG) using LIMA to LAD; RIMA to PDA; and Left radial harvest vein to Circ.;  Surgeon: Wonda Olds, MD;  Location: [place];  Service: Open Heart Surgery;  Laterality: N/A;  . EAR CYST EXCISION  12/10/2011   Procedure: CYST REMOVAL;  Surgeon: Gae Bon, DDS;  Location: [place];  Service: Oral Surgery;  Laterality: Left;  Left Mandible Cyst Removal  . IR THORACENTESIS ASP PLEURAL SPACE W/IMG GUIDE  10/27/2019  . MULTIPLE EXTRACTIONS WITH ALVEOLOPLASTY  12/10/2011   Procedure: MULTIPLE EXTRACION WITH ALVEOLOPLASTY;  Surgeon: Gae Bon, DDS;  Location: [place];  Service: Oral Surgery;  Laterality: N/A;  Right Maxillary Tuberosity Reduction; Right  Maxillary Buccal Exostosis; Bilateral Mandibular Tori   . PERIPHERAL VASCULAR CATHETERIZATION N/A 12/28/2014   Procedure: Abdominal  Aortogram;  Surgeon: Serafina Mitchell, MD;  Location: [place] [facility];  Service: Cardiovascular;  Laterality: N/A;  . PERIPHERAL VASCULAR CATHETERIZATION N/A 12/20/2015   Procedure: Abdominal Aortogram;  Surgeon: Serafina Mitchell, MD;  Location: [place] [facility];  Service: Cardiovascular;  Laterality: N/A;  . PERIPHERAL VASCULAR CATHETERIZATION  12/20/2015   Procedure: Peripheral Vascular Balloon Angioplasty;  Surgeon: Serafina Mitchell, MD;  Location: [place] [facility];  Service: Cardiovascular;;  . RADIAL ARTERY HARVEST Left 10/29/2019   Procedure: RADIAL ARTERY HARVEST;  Surgeon: Wonda Olds, MD;  Location: [place];  Service: Open Heart Surgery;  Laterality: Left;  . RIGHT/LEFT HEART CATH AND CORONARY ANGIOGRAPHY N/A 10/28/2019   Procedure: RIGHT/LEFT HEART CATH AND CORONARY ANGIOGRAPHY;  Surgeon: Burnell Blanks, MD;  Location: [place] [facility];  Service: Cardiovascular;  Laterality: N/A;  . STERNAL WOUND DEBRIDEMENT N/A 12/01/2019   Procedure: STERNAL WOUND DEBRIDEMENT and WOUND VAC CHANGE;  Surgeon: Wonda Olds, MD;  Location: [place];  Service: Thoracic;  Laterality: N/A;  . STERNAL WOUND DEBRIDEMENT N/A 12/04/2019   Procedure: STERNAL WOUND DEBRIDEMENT and WOUND VAC CHANGE;  Surgeon: Wonda Olds, MD;  Location: [place];  Service: Thoracic;  Laterality: N/A;  . STERNAL WOUND DEBRIDEMENT N/A 12/09/2019   Procedure: STERNAL WOUND DEBRIDEMENT;  Surgeon: Wonda Olds, MD;  Location: [place];  Service: Thoracic;  Laterality: N/A;  . TEE WITHOUT CARDIOVERSION N/A 10/29/2019   Procedure: TRANSESOPHAGEAL ECHOCARDIOGRAM (TEE);  Surgeon: Wonda Olds, MD;  Location: [place];  Service: Open Heart Surgery;  Laterality: N/A;  . WOUND EXPLORATION N/A 11/27/2019   Procedure: EXCISIONAL DEBRIDEMENT OF SUPERFICIAL STERNAL WOUND EXPLORATION;  Surgeon: Ivin Poot, MD;  Location: [place];  Service: Cardiovascular;  Laterality: N/A;  Superficial sternal wound    Current  Medications: Current Meds  Medication Sig  . empagliflozin (JARDIANCE) 25 MG TABS tablet Take 1 tablet (25 mg total) by mouth daily.  . Multiple Vitamins-Minerals (CENTRUM SILVER) tablet Take 1 tablet by mouth 2 (two) times a week.  . OXYGEN Inhale 2-3 L/min into the lungs at bedtime as needed (for shortness of breath).  . potassium chloride (KLOR-CON) 10 MEQ tablet Take 1 tablet (10 mEq total) by mouth daily.  . sacubitril-valsartan (ENTRESTO) 49-51 MG Take 1 tablet by mouth 2 (two) times daily.  . [DISCONTINUED] amLODipine (NORVASC) 10 MG tablet Take 1 tablet (10 mg total) by mouth daily.  . [DISCONTINUED] apixaban (ELIQUIS) 5 MG TABS tablet Take 1 tablet (5 mg total) by mouth 2 (two) times daily.  . [DISCONTINUED] atorvastatin (LIPITOR) 40 MG tablet Take 1 tablet (40 mg total) by mouth daily.  . [DISCONTINUED] clopidogrel (PLAVIX) 75 MG tablet Take 1 tablet (75 mg total) by mouth daily.  . [DISCONTINUED] furosemide (LASIX) 40 MG tablet Take 1 tablet (40 mg total) by mouth daily.  . [DISCONTINUED] metoprolol  succinate (TOPROL-XL) 25 MG 24 hr tablet Take 1 tablet (25 mg total) by mouth daily.     Allergies:   Codeine, Hydrocodone, Gabapentin, Pregabalin, Simvastatin, Lisinopril, and Venlafaxine   Social History   Socioeconomic History  . Marital status: Legally Separated    Spouse name: Not on file  . Number of children: 3  . Years of education: Not on file  . Highest education level: Not on file  Occupational History    Comment: Unemployed  Tobacco Use  . Smoking status: Former Smoker    Packs/day: 0.25    Years: 25.00    Pack years: 6.25    Types: Cigarettes    Start date: 9    Quit date: 10/23/2019    Years since quitting: 0.6  . Smokeless tobacco: Never Used  . Tobacco comment: 1/4 pack per day  Vaping Use  . Vaping Use: Never used  Substance and Sexual Activity  . Alcohol use: No    Alcohol/week: 0.0 standard drinks  . Drug use: No  . Sexual activity: Not on file   Other Topics Concern  . Not on file  Social History Narrative  . Not on file   Social Determinants of Health   Financial Resource Strain: Low Risk   . Difficulty of Paying Living Expenses: Not very hard  Food Insecurity: No Food Insecurity  . Worried About Charity fundraiser in the Last Year: Never true  . Ran Out of Food in the Last Year: Never true  Transportation Needs: No Transportation Needs  . Lack of Transportation (Medical): No  . Lack of Transportation (Non-Medical): No  Physical Activity: Insufficiently Active  . Days of Exercise per Week: 1 day  . Minutes of Exercise per Session: 30 min  Stress: Not on file  Social Connections: Socially Isolated  . Frequency of Communication with Friends and Family: More than three times a week  . Frequency of Social Gatherings with Friends and Family: More than three times a week  . Attends Religious Services: Never  . Active Member of Clubs or Organizations: No  . Attends Archivist Meetings: Never  . Marital Status: Divorced     Family History: The patient's family history includes Diabetes in his father; Heart disease in his father; Hyperlipidemia in his father; Hypertension in his father; Kidney failure in his father.  ROS:   Please see the history of present illness.     All other systems reviewed and are negative.  EKGs/Labs/Other Studies Reviewed:    The following studies were reviewed today: Echo 06/04/2020 IMPRESSIONS    1. Left ventricular ejection fraction, by estimation, is 25 to 30%. The  left ventricle has severely decreased function. The left ventricle  demonstrates regional wall motion abnormalities (see scoring  diagram/findings for description). There is mild  concentric left ventricular hypertrophy. Left ventricular diastolic  parameters are consistent with Grade III diastolic dysfunction  (restrictive).  2. Right ventricular systolic function is moderately reduced. The right  ventricular  size is normal. There is normal pulmonary artery systolic  pressure. The estimated right ventricular systolic pressure is 28.7 mmHg.  3. Left atrial size was severely dilated.  4. The mitral valve is normal in structure. No evidence of mitral valve  regurgitation. No evidence of mitral stenosis.  5. Tricuspid regurgitation is eccentric; mild but may be underestimated  in this study.  6. The aortic valve is grossly normal. Aortic valve regurgitation is not  visualized.  7. The inferior vena cava  is normal in size with greater than 50%  respiratory variability, suggesting right atrial pressure of 3 mmHg.   Comparison(s): A prior study was performed on 12/01/19. Prior images  reviewed side by side. Pleural effusion has resolved, right   EKG:  EKG is not ordered today.  The ekg ordered 06/06/2020 demonstrates NSR, AL infarct (old)  Recent Labs: 11/26/2019: Magnesium 1.9 11/29/2019: ALT 22 06/03/2020: B Natriuretic Peptide 1,905.8; Hemoglobin 16.7; Platelets 401 06/06/2020: BUN 20; Creatinine, Ser 1.07; Potassium 4.0; Sodium 138  Recent Lipid Panel    Component Value Date/Time   CHOL 135 10/27/2019 0428   TRIG 89 10/27/2019 0428   HDL 34 (L) 10/27/2019 0428   CHOLHDL 4.0 10/27/2019 0428   VLDL 18 10/27/2019 0428   LDLCALC 83 10/27/2019 0428    Physical Exam:    VS:  BP 122/72   Pulse 66   Ht 5\' 7"  (1.702 m)   Wt 197 lb 12.8 oz (89.7 kg)   SpO2 96%   BMI 30.98 kg/m     Wt Readings from Last 3 Encounters:  06/13/20 197 lb 12.8 oz (89.7 kg)  06/08/20 201 lb (91.2 kg)  06/06/20 199 lb 4.7 oz (90.4 kg)     GEN: Well nourished, well developed in no acute distress HEENT: Normal NECK: No JVD CARDIAC: RRR, no murmurs, rubs, gallops CHEST:  Sternum abnormality RESPIRATORY:  Clear to auscultation without rales, wheezing or rhonchi  ABDOMEN: Soft, non-distended MUSCULOSKELETAL:  No edema; No deformity  SKIN: Warm and dry NEUROLOGIC:  Alert and oriented x 3 PSYCHIATRIC:  Normal  affect   ASSESSMENT:    Acute on chronic HFrEF (heart failure with reduced ejection fraction) (HCC) No significant weight change but symptomatically improved. Check BNP  Hx of CABG CABG x 2 10/29/2019 with LIMA-LAD, RIMA-PDA  Ischemic cardiomyopathy EF 25-30%- Feb 2022 (off medications)  Essential hypertension Controlled  PAF (paroxysmal atrial fibrillation) (HCC) Post CABG- NSR- on Eliquis  PLAN:    I would hold off on adding Aldactone till his BMP comes back.  His K+ is drifting up on Entresto and K+ supplement.  If stable consider stopping K+ and adding low dose Aldactone but his K+ level will need to be followed closely.  OV 2 months, echo 3 months.    Medication Adjustments/Labs and Tests Ordered: Current medicines are reviewed at length with the patient today.  Concerns regarding medicines are outlined above.  No orders of the defined types were placed in this encounter.  Meds ordered this encounter  Medications  . amLODipine (NORVASC) 10 MG tablet    Sig: Take 1 tablet (10 mg total) by mouth daily.    Dispense:  90 tablet    Refill:  3  . apixaban (ELIQUIS) 5 MG TABS tablet    Sig: Take 1 tablet (5 mg total) by mouth 2 (two) times daily.    Dispense:  180 tablet    Refill:  3  . atorvastatin (LIPITOR) 40 MG tablet    Sig: Take 1 tablet (40 mg total) by mouth daily.    Dispense:  90 tablet    Refill:  3  . clopidogrel (PLAVIX) 75 MG tablet    Sig: Take 1 tablet (75 mg total) by mouth daily.    Dispense:  90 tablet    Refill:  3  . furosemide (LASIX) 40 MG tablet    Sig: Take 1 tablet (40 mg total) by mouth daily.    Dispense:  90 tablet    Refill:  3  . metoprolol succinate (TOPROL-XL) 25 MG 24 hr tablet    Sig: Take 1 tablet (25 mg total) by mouth daily.    Dispense:  90 tablet    Refill:  3    Patient Instructions  Medication Instructions:  Continue current medications  *If you need a refill on your cardiac medications before your next appointment,  please call your pharmacy*   Lab Work: None Ordered   Testing/Procedures: None Ordered   Follow-Up: At [facility], you and your health needs are our priority.  As part of our continuing mission to provide you with exceptional heart care, we have created designated Provider Care Teams.  These Care Teams include your primary Cardiologist (physician) and Advanced Practice Providers (APPs -  Physician Assistants and Nurse Practitioners) who all work together to provide you with the care you need, when you need it.  We recommend signing up for the patient portal called "MyChart".  Sign up information is provided on this After Visit Summary.  MyChart is used to connect with patients for Virtual Visits (Telemedicine).  Patients are able to view lab/test results, encounter notes, upcoming appointments, etc.  Non-urgent messages can be sent to your provider as well.   To learn more about what you can do with MyChart, go to [email or web address].    Your next appointment:   2 month(s)  The format for your next appointment:   In Person  Provider:   You may see Kirk Ruths, MD or one of the following Advanced Practice Providers on your designated Care Team:    Kerin Ransom, PA-C  [place], [place]  Coletta Memos, FNP         Signed, Kerin Ransom, [place]  06/13/2020 2:39 PM    [place]

## 2020-06-13 NOTE — Assessment & Plan Note (Signed)
CABG x 2 10/29/2019 with LIMA-LAD, RIMA-PDA

## 2020-06-15 ENCOUNTER — Telehealth (HOSPITAL_COMMUNITY): Payer: Self-pay

## 2020-06-15 NOTE — Telephone Encounter (Signed)
Pharmacy Transitions of Care Follow-up Telephone Call  Date of discharge: 06/06/20 Discharge Diagnosis: CHF, coronary artery bypass graft  How have you been since you were released from the hospital? Patient has been doing well since discharge. Feels a little tired due to addition of new Blood Pressure meds but he is adjusting. Knows s/sx of bleeding to look out for. Has been on blood thinners before.  Medication changes made at discharge: yes  Medication changes obtained and verified? yes    Medication Accessibility:  . Home Pharmacy: no refills given at [facility] but patient has already had follow up with provider and had refills sent to home pharmacy    Medication Review:  APIXABEN (ELIQUIS)  Apixaban 5mg  BID on 06/06/20 - Discussed importance of taking medication around the same time everyday  - Advised patient of medications to avoid (NSAIDs, ASA)  - Educated that Tylenol (acetaminophen) will be the preferred analgesic to prevent risk of bleeding  - Emphasized importance of monitoring for signs and symptoms of bleeding (abnormal bruising, prolonged bleeding, nose bleeds, bleeding from gums, discolored urine, black tarry stools)  - Advised patient to alert all providers of anticoagulation therapy prior to starting a new medication or having a procedure    CLOPIDOGREL (PLAVIX) Clopidogrel 75 mg once daily.  - Advised patient of medications to avoid (NSAIDs, ASA)  - Educated that Tylenol (acetaminophen) will be the preferred analgesic to prevent risk of bleeding  - Emphasized importance of monitoring for signs and symptoms of bleeding (abnormal bruising, prolonged bleeding, nose bleeds, bleeding from gums, discolored urine, black tarry stools)  - Advised patient to alert all providers of anticoagulation therapy prior to starting a new medication or having a procedure   Follow-up Appointments:  Saw Dr. Rosalyn Gess in Cardiology on 06/13/20 for follow up and received refills at this visit.  Next follow up is with Internal Medicine (Dr. Loistine Simas) on 06/17/20.  If their condition worsens, is the pt aware to call PCP or go to the Emergency Dept.? yes  Final Patient Assessment: Patient doing well. Has follow ups scheduled and refills at home pharmacy.

## 2020-06-15 NOTE — Addendum Note (Signed)
Encounter addended by: Katherine Roan, MD on: 06/15/2020 3:38 PM  Actions taken: Clinical Note Signed

## 2020-07-18 ENCOUNTER — Telehealth: Payer: Self-pay | Admitting: General Practice

## 2020-07-18 NOTE — Progress Notes (Unsigned)
Cardiology Clinic Note   Patient Name: [name] Date of Encounter: 07/19/2020  Primary Care Provider:  Concepcion Elk, MD Primary Cardiologist:  Kirk Ruths, MD  Patient Profile    [name] 60 year old male presents the clinic today for an evaluation of his shortness of breath.  Past Medical History    Past Medical History:  Diagnosis Date  . Anxiety   . CAD (coronary artery disease)   . Chronic back pain   . Colon polyps   . Diabetes mellitus   . Heart attack ([place])   . Hyperlipidemia   . Hypertension   . Panic attacks   . PVD (peripheral vascular disease) ([place])    Past Surgical History:  Procedure Laterality Date  . APPLICATION OF A-CELL OF BACK N/A 12/09/2019   Procedure: APPLICATION OF A-CELL OF BACK;  Surgeon: Wonda Olds, MD;  Location: [facility];  Service: Thoracic;  Laterality: N/A;  . APPLICATION OF A-CELL OF CHEST/ABDOMEN N/A 12/04/2019   Procedure: APPLICATION OF A-CELL OF CHEST;  Surgeon: Wonda Olds, MD;  Location: [facility];  Service: Thoracic;  Laterality: N/A;  . APPLICATION OF WOUND VAC N/A 11/27/2019   Procedure: APPLICATION OF WOUND VAC;  Surgeon: Ivin Poot, MD;  Location: [place];  Service: Cardiovascular;  Laterality: N/A;  Lower midline superficial sternal wound area.  . APPLICATION OF WOUND VAC N/A 12/09/2019   Procedure: WOUND VAC EXCHANGE;  Surgeon: Wonda Olds, MD;  Location: [place];  Service: Thoracic;  Laterality: N/A;  . APPLICATION OF WOUND VAC N/A 12/14/2019   Procedure: WOUND VAC CHANGE;  Surgeon: Wonda Olds, MD;  Location: [place];  Service: Thoracic;  Laterality: N/A;  . CORONARY ANGIOPLASTY WITH STENT PLACEMENT    . CORONARY ARTERY BYPASS GRAFT N/A 10/29/2019   Procedure: CORONARY ARTERY BYPASS GRAFTING (CABG) using LIMA to LAD; RIMA to PDA; and Left radial harvest vein to Circ.;  Surgeon: Wonda Olds, MD;  Location: [place];  Service: Open Heart Surgery;  Laterality: N/A;  . EAR CYST EXCISION  12/10/2011    Procedure: CYST REMOVAL;  Surgeon: Gae Bon, DDS;  Location: [place];  Service: Oral Surgery;  Laterality: Left;  Left Mandible Cyst Removal  . IR THORACENTESIS ASP PLEURAL SPACE W/IMG GUIDE  10/27/2019  . MULTIPLE EXTRACTIONS WITH ALVEOLOPLASTY  12/10/2011   Procedure: MULTIPLE EXTRACION WITH ALVEOLOPLASTY;  Surgeon: Gae Bon, DDS;  Location: [place];  Service: Oral Surgery;  Laterality: N/A;  Right Maxillary Tuberosity Reduction; Right  Maxillary Buccal Exostosis; Bilateral Mandibular Tori   . PERIPHERAL VASCULAR CATHETERIZATION N/A 12/28/2014   Procedure: Abdominal Aortogram;  Surgeon: Serafina Mitchell, MD;  Location: [place] [facility];  Service: Cardiovascular;  Laterality: N/A;  . PERIPHERAL VASCULAR CATHETERIZATION N/A 12/20/2015   Procedure: Abdominal Aortogram;  Surgeon: Serafina Mitchell, MD;  Location: [place] [facility];  Service: Cardiovascular;  Laterality: N/A;  . PERIPHERAL VASCULAR CATHETERIZATION  12/20/2015   Procedure: Peripheral Vascular Balloon Angioplasty;  Surgeon: Serafina Mitchell, MD;  Location: [place] [facility];  Service: Cardiovascular;;  . RADIAL ARTERY HARVEST Left 10/29/2019   Procedure: RADIAL ARTERY HARVEST;  Surgeon: Wonda Olds, MD;  Location: [place];  Service: Open Heart Surgery;  Laterality: Left;  . RIGHT/LEFT HEART CATH AND CORONARY ANGIOGRAPHY N/A 10/28/2019   Procedure: RIGHT/LEFT HEART CATH AND CORONARY ANGIOGRAPHY;  Surgeon: Burnell Blanks, MD;  Location: [place] [facility];  Service: Cardiovascular;  Laterality: N/A;  .  STERNAL WOUND DEBRIDEMENT N/A 12/01/2019   Procedure: STERNAL WOUND DEBRIDEMENT and WOUND VAC CHANGE;  Surgeon: Wonda Olds, MD;  Location: [place];  Service: Thoracic;  Laterality: N/A;  . STERNAL WOUND DEBRIDEMENT N/A 12/04/2019   Procedure: STERNAL WOUND DEBRIDEMENT and WOUND VAC CHANGE;  Surgeon: Wonda Olds, MD;  Location: [place];  Service: Thoracic;  Laterality: N/A;  . STERNAL WOUND DEBRIDEMENT N/A 12/09/2019    Procedure: STERNAL WOUND DEBRIDEMENT;  Surgeon: Wonda Olds, MD;  Location: [place];  Service: Thoracic;  Laterality: N/A;  . TEE WITHOUT CARDIOVERSION N/A 10/29/2019   Procedure: TRANSESOPHAGEAL ECHOCARDIOGRAM (TEE);  Surgeon: Wonda Olds, MD;  Location: [place];  Service: Open Heart Surgery;  Laterality: N/A;  . WOUND EXPLORATION N/A 11/27/2019   Procedure: EXCISIONAL DEBRIDEMENT OF SUPERFICIAL STERNAL WOUND EXPLORATION;  Surgeon: Ivin Poot, MD;  Location: [place];  Service: Cardiovascular;  Laterality: N/A;  Superficial sternal wound    Allergies  Allergies  Allergen Reactions  . Codeine Shortness Of Breath and Itching  . Hydrocodone Shortness Of Breath, Nausea Only and Rash    Breathing problems ALSO  . Gabapentin Other (See Comments)    Suicidal thoughts, extreme dreams  . Pregabalin Other (See Comments)    Suicidal thoughts, extreme dreams   . Simvastatin Other (See Comments)    Pain in joints  . Lisinopril Itching and Rash  . Venlafaxine Rash    History of Present Illness    [name] is a PMH of CAD status post PCI 2005 and 2007.  He was admitted 7/21 with anasarca.  He had shortness of breath and scrotal edema.  His echocardiogram showed 30-35% EF with grade 3 diastolic dysfunction.  He underwent cardiac catheterization which showed significant progression of his coronary artery disease with an occluded RCA 80% circumflex, 99% mid LAD, 90% diagonal, 80% OM with in-stent restenosis and 90% diagonal.  He underwent CABG x2 LIMA-LAD and RIMA-PDA 10/29/2019.  He was noted to have postoperative atrial fibrillation and was loaded with amiodarone and initiated on apixaban.  He was fluid volume overloaded and diuresed 23 L during that admission as well as right thoracentesis 10/27/2019.  He was admitted 8/21 with sternal wound infection and required debridement and wound VAC.  It eventually healed.  However, he unfortunately did not follow-up with cardiology.  He ran out of his  medications and reported he felt like he did not need them.  He was admitted 2/22 with acute CHF.  His EKG showed normal sinus rhythm.  He was diuresed by hospital service and not seen by cardiology.  He was last seen by Kerin Ransom, PA-C on 06/13/2020.  During that time he was doing well.  He was tolerating his medications.  His weight was noted to be 199 pounds which was up from his 10/21 weight of 167 pounds.  He contacted the nurse triage line 07/18/2020 and reported he was having increased shortness of breath with increased physical activity.  He reported compliance with his furosemide.  He presents to the clinic today for evaluation and states over the last few days he has noticed increased shortness of breath with increased physical activity.  He reports that he has been taking his medication as prescribed.  However, he has been having troubles with his pharmacy filling his medications.  He has not noticed an increased lower extremity swelling but has noticed some increased abdominal fullness.  He reports that he has been eating saltier foods recently and had 2  hotdogs this weekend.  We reviewed the importance of daily weights and low-salt diet.  I will double his Lasix for 3 days and double his supplemental potassium for 3 days, order BMP for 1 week, have him follow-up in 1 to 2 weeks, and give him the salty 6 diet sheet.  I have asked him to weigh daily and contact the office with a weight gain of 3 overnight and 5 pounds in 1 week.  Today he denies chest pain,  lower extremity edema, fatigue, palpitations, melena, hematuria, hemoptysis, diaphoresis, weakness, presyncope, syncope, and PND.   Home Medications    Prior to Admission medications   Medication Sig Start Date End Date Taking? Authorizing Provider  amLODipine (NORVASC) 10 MG tablet Take 1 tablet (10 mg total) by mouth daily. 06/13/20 07/13/20  Erlene Quan, PA-C  apixaban (ELIQUIS) 5 MG TABS tablet Take 1 tablet (5 mg total) by mouth  2 (two) times daily. 06/13/20 07/13/20  Erlene Quan, PA-C  atorvastatin (LIPITOR) 40 MG tablet Take 1 tablet (40 mg total) by mouth daily. 06/13/20 07/13/20  Erlene Quan, PA-C  furosemide (LASIX) 40 MG tablet Take 1 tablet (40 mg total) by mouth daily. 06/13/20 07/13/20  Erlene Quan, PA-C  metoprolol succinate (TOPROL-XL) 25 MG 24 hr tablet Take 1 tablet (25 mg total) by mouth daily. 06/13/20 07/13/20  Erlene Quan, PA-C  Multiple Vitamins-Minerals (CENTRUM SILVER) tablet Take 1 tablet by mouth 2 (two) times a week.    [provider]  OXYGEN Inhale 2-3 L/min into the lungs at bedtime as needed (for shortness of breath).    [provider]  potassium chloride (KLOR-CON) 10 MEQ tablet Take 1 tablet (10 mEq total) by mouth daily. 06/07/20 07/07/20  Donne Hazel, MD  sacubitril-valsartan (ENTRESTO) 49-51 MG Take 1 tablet by mouth 2 (two) times daily. 06/08/20   Katherine Roan, MD    Family History    Family History  Problem Relation Age of Onset  . Diabetes Father   . Kidney failure Father   . Hypertension Father   . Hyperlipidemia Father   . Heart disease Father    He indicated that his mother is alive. He indicated that his father is deceased.  Social History    Social History   Socioeconomic History  . Marital status: Legally Separated    Spouse name: Not on file  . Number of children: 3  . Years of education: Not on file  . Highest education level: Not on file  Occupational History    Comment: Unemployed  Tobacco Use  . Smoking status: Former Smoker    Packs/day: 0.25    Years: 25.00    Pack years: 6.25    Types: Cigarettes    Start date: 63    Quit date: 10/23/2019    Years since quitting: 0.7  . Smokeless tobacco: Never Used  . Tobacco comment: 1/4 pack per day  Vaping Use  . Vaping Use: Never used  Substance and Sexual Activity  . Alcohol use: No    Alcohol/week: 0.0 standard drinks  . Drug use: No  . Sexual activity: Not on file  Other  Topics Concern  . Not on file  Social History Narrative  . Not on file   Social Determinants of Health   Financial Resource Strain: Low Risk   . Difficulty of Paying Living Expenses: Not very hard  Food Insecurity: No Food Insecurity  . Worried About Charity fundraiser in  the Last Year: Never true  . Ran Out of Food in the Last Year: Never true  Transportation Needs: No Transportation Needs  . Lack of Transportation (Medical): No  . Lack of Transportation (Non-Medical): No  Physical Activity: Insufficiently Active  . Days of Exercise per Week: 1 day  . Minutes of Exercise per Session: 30 min  Stress: Not on file  Social Connections: Socially Isolated  . Frequency of Communication with Friends and Family: More than three times a week  . Frequency of Social Gatherings with Friends and Family: More than three times a week  . Attends Religious Services: Never  . Active Member of Clubs or Organizations: No  . Attends Archivist Meetings: Never  . Marital Status: Divorced  Human resources officer Violence: Not on file     Review of Systems    General:  No chills, fever, night sweats or weight changes.  Cardiovascular:  No chest pain, dyspnea on exertion, edema, orthopnea, palpitations, paroxysmal nocturnal dyspnea. Dermatological: No rash, lesions/masses Respiratory: No cough, dyspnea Urologic: No hematuria, dysuria Abdominal:   No nausea, vomiting, diarrhea, bright red blood per rectum, melena, or hematemesis Neurologic:  No visual changes, wkns, changes in mental status. All other systems reviewed and are otherwise negative except as noted above.  Physical Exam    VS:  BP (!) 154/98 (BP Location: Right Arm, Patient Position: Sitting, Cuff Size: Normal)   Pulse 64   Ht 5\' 7"  (1.702 m)   Wt 208 lb 12.8 oz (94.7 kg)   BMI 32.70 kg/m  , BMI Body mass index is 32.7 kg/m. GEN: Well nourished, well developed, in no acute distress. HEENT: normal. Neck: Supple, no JVD,  carotid bruits, or masses. Cardiac: RRR, no murmurs, rubs, or gallops. No clubbing, cyanosis, increased abdominal fullness edema.  Radials/DP/PT 2+ and equal bilaterally.  Respiratory:  Respirations regular and unlabored, clear to auscultation bilaterally. GI: Soft, nontender, mild distention, BS + x 4. MS: no deformity or atrophy. Skin: warm and dry, no rash. Neuro:  Strength and sensation are intact. Psych: Normal affect.  Accessory Clinical Findings    Recent Labs: 11/26/2019: Magnesium 1.9 11/29/2019: ALT 22 06/03/2020: B Natriuretic Peptide 1,905.8; Hemoglobin 16.7; Platelets 401 06/06/2020: BUN 20; Creatinine, Ser 1.07; Potassium 4.0; Sodium 138   Recent Lipid Panel    Component Value Date/Time   CHOL 135 10/27/2019 0428   TRIG 89 10/27/2019 0428   HDL 34 (L) 10/27/2019 0428   CHOLHDL 4.0 10/27/2019 0428   VLDL 18 10/27/2019 0428   LDLCALC 83 10/27/2019 0428    ECG personally reviewed by me today-none today.  Echocardiogram 06/04/2020 IMPRESSIONS    1. Left ventricular ejection fraction, by estimation, is 25 to 30%. The  left ventricle has severely decreased function. The left ventricle  demonstrates regional wall motion abnormalities (see scoring  diagram/findings for description). There is mild  concentric left ventricular hypertrophy. Left ventricular diastolic  parameters are consistent with Grade III diastolic dysfunction  (restrictive).  2. Right ventricular systolic function is moderately reduced. The right  ventricular size is normal. There is normal pulmonary artery systolic  pressure. The estimated right ventricular systolic pressure is 09.8 mmHg.  3. Left atrial size was severely dilated.  4. The mitral valve is normal in structure. No evidence of mitral valve  regurgitation. No evidence of mitral stenosis.  5. Tricuspid regurgitation is eccentric; mild but may be underestimated  in this study.  6. The aortic valve is grossly normal. Aortic valve  regurgitation  is not  visualized.  7. The inferior vena cava is normal in size with greater than 50%  respiratory variability, suggesting right atrial pressure of 3 mmHg.   Comparison(s): A prior study was performed on 12/01/19. Prior images  reviewed side by side. Pleural effusion has resolved, right ventricular  function is worse, left ventricular function is similar.   Assessment & Plan   1.  Acute on chronic systolic and diastolic CHF-contacted nurse triage line 07/18/2020 and reported that he was having increased dyspnea with increased physical activity.  He reports compliance with his medications.  Weight today 208 lbs.  Weight on 2/ 21/22 to 199 pounds.  Reports increased abdominal fullness. Continue metoprolol, Entresto Increase furosemide to 80 mg x 3 days then return to 40 mg Increase potassium to 20 mEq x 3 days then resume 10 mg Heart healthy low-sodium diet-salty 6 given Increase physical activity as tolerated Daily weights-contact office with a weight increase of 3 pounds overnight or 5 pounds in 1 week. Blood pressure log BNP today BMP in 1 week  Coronary artery disease-no chest pain today.  Status post CABG x2 10/29/2019 Continue amlodipine, metoprolol, Entresto Heart healthy low-sodium diet-salty 6 given Increase physical activity as tolerated  Ischemic cardiomyopathy-echocardiogram 2/22 showed EF 25-30%.  Notes increased dyspnea with increased physical activity. Continue Entresto, metoprolol, amlodipine Heart healthy low-sodium diet-salty 6 given Increase physical activity as tolerated  Essential hypertension-BP today 154/98.  Did not take medications this morning.  Well-controlled at home. Continue amlodipine, furosemide, metoprolol, Entresto Heart healthy low-sodium diet-salty 6 given Increase physical activity as tolerated  Paroxysmal atrial fibrillation-heart rate today 64.  Denies bleeding issues. Continue apixaban Heart healthy low-sodium diet-salty 6  given Increase physical activity as tolerated  Disposition: Follow-up with me in 1 week and Dr. Stanford Breed in 3 months.  Jossie Ng. Cleaver NP-C    07/19/2020, 9:12 AM [place] [address] Office [phone] Fax [phone]  Notice: This dictation was prepared with Dragon dictation along with smaller phrase technology. Any transcriptional errors that result from this process are unintentional and may not be corrected upon review.  I spent 12 minutes examining this patient, reviewing medications, and using patient centered shared decision making involving her cardiac care.  Prior to her visit I spent greater than 20 minutes reviewing her past medical history,  medications, and prior cardiac tests.

## 2020-07-18 NOTE — Telephone Encounter (Signed)
Pt c/o Shortness Of Breath: STAT if SOB developed within the last 24 hours or pt is noticeably SOB on the phone  1. Are you currently SOB (can you hear that pt is SOB on the phone)? No  2. How long have you been experiencing SOB? Past couple of days   3. Are you SOB when sitting or when up moving around? Moving around   4. Are you currently experiencing any other symptoms? No energy    Ronalee Belts is calling stating he has been having labored breathing upon exertion that he believes is a sign that swelling is going to begin occurring again. He also states he only has 1 tablet of his Lasix left and the pharmacy advised him he already picked it up. He states the only way they said he could get it was if a new prescription was sent and he paid out of pocket. He would like to discuss this with someone to try and resolve this due to never picking up a new bottle and being confused by this issue. Ronalee Belts is scheduled to come in tomorrow and be seen in regards to this SOB and medication issues. Please advise.

## 2020-07-19 ENCOUNTER — Encounter: Payer: Self-pay | Admitting: General Practice

## 2020-07-19 ENCOUNTER — Ambulatory Visit (INDEPENDENT_AMBULATORY_CARE_PROVIDER_SITE_OTHER): Payer: Medicaid Other | Admitting: General Practice

## 2020-07-19 ENCOUNTER — Other Ambulatory Visit: Payer: Self-pay

## 2020-07-19 VITALS — BP 154/98 | HR 64 | Ht 67.0 in | Wt 208.8 lb

## 2020-07-19 DIAGNOSIS — I2510 Atherosclerotic heart disease of native coronary artery without angina pectoris: Secondary | ICD-10-CM

## 2020-07-19 DIAGNOSIS — Z79899 Other long term (current) drug therapy: Secondary | ICD-10-CM

## 2020-07-19 DIAGNOSIS — I10 Essential (primary) hypertension: Secondary | ICD-10-CM | POA: Diagnosis not present

## 2020-07-19 DIAGNOSIS — I480 Paroxysmal atrial fibrillation: Secondary | ICD-10-CM

## 2020-07-19 DIAGNOSIS — I255 Ischemic cardiomyopathy: Secondary | ICD-10-CM

## 2020-07-19 DIAGNOSIS — Z9861 Coronary angioplasty status: Secondary | ICD-10-CM

## 2020-07-19 DIAGNOSIS — I5041 Acute combined systolic (congestive) and diastolic (congestive) heart failure: Secondary | ICD-10-CM | POA: Diagnosis not present

## 2020-07-19 MED ORDER — POTASSIUM CHLORIDE CRYS ER 10 MEQ PO TBCR: 10.0000 meq | EXTENDED_RELEASE_TABLET | Freq: Every day | ORAL | 0 refills | Status: DC

## 2020-07-19 MED ORDER — APIXABAN 5 MG PO TABS: 5.0000 mg | ORAL_TABLET | Freq: Two times a day (BID) | ORAL | 3 refills | Status: DC

## 2020-07-19 MED ORDER — FUROSEMIDE 40 MG PO TABS: 40.0000 mg | ORAL_TABLET | Freq: Every day | ORAL | 3 refills | Status: DC

## 2020-07-19 MED ORDER — CARVEDILOL 25 MG PO TABS: 25.0000 mg | ORAL_TABLET | Freq: Two times a day (BID) | ORAL | 3 refills | Status: DC

## 2020-07-19 MED ORDER — SACUBITRIL-VALSARTAN 49-51 MG PO TABS: 1.0000 | ORAL_TABLET | Freq: Two times a day (BID) | ORAL | 6 refills | Status: DC

## 2020-07-19 MED ORDER — EMPAGLIFLOZIN 25 MG PO TABS: 25.0000 mg | ORAL_TABLET | Freq: Every day | ORAL | Status: DC

## 2020-07-19 MED ORDER — AMLODIPINE BESYLATE 10 MG PO TABS: 10.0000 mg | ORAL_TABLET | Freq: Every day | ORAL | 3 refills | Status: DC

## 2020-07-19 MED ORDER — ATORVASTATIN CALCIUM 40 MG PO TABS: 40.0000 mg | ORAL_TABLET | Freq: Every day | ORAL | 3 refills | Status: DC

## 2020-07-19 MED ORDER — METOPROLOL SUCCINATE ER 25 MG PO TB24: 25.0000 mg | ORAL_TABLET | Freq: Every day | ORAL | 3 refills | Status: DC

## 2020-07-19 NOTE — Patient Instructions (Signed)
Medication Instructions:  TAKE FUROSEMIDE(LASIX) 80MG  x3DAYS THEN BACK TO 40MG   TAKE POTASSIUM 20MEQ x3 DAYS THEN BACK TO 10MEQ  *If you need a refill on your cardiac medications before your next appointment, please call your pharmacy*  Lab Work: BNP TODAY THEN IN 1 WEEK BMET-HERE IN OUR OFFICE If you have labs (blood work) drawn today and your tests are completely normal, you will receive your results only by:  [place] (if you have MyChart) OR A paper copy in the mail.  If you have any lab test that is abnormal or we need to change your treatment, we will call you to review the results. You may go to any [facility] that is convenient for you however, we do have a lab in our office that is able to assist you. You DO NOT need an appointment for our lab. The lab is open 8:00am and closes at 4:00pm. Lunch 12:45 - 1:45pm.  Testing/Procedures: NONE  Special Instructions TAKE AND LOG YOUR WEIGHT DAILY AND BRING BACK WITH YOU TO YOUR FOLLOW UP APPOINTMENT  PLEASE READ AND FOLLOW SALTY 6-ATTACHED-1,800 mg daily  PLEASE INCREASE PHYSICAL ACTIVITY AS TOLERATED  Follow-Up: Your next appointment:  08-04-2020 @11 :15AM  In Person with [place], FNP-C   At [facility], you and your health needs are our priority.  As part of our continuing mission to provide you with exceptional heart care, we have created designated Provider Care Teams.  These Care Teams include your primary Cardiologist (physician) and Advanced Practice Providers (APPs -  Physician Assistants and Nurse Practitioners) who all work together to provide you with the care you need, when you need it.  We recommend signing up for the patient portal called "MyChart".  Sign up information is provided on this After Visit Summary.  MyChart is used to connect with patients for Virtual Visits (Telemedicine).  Patients are able to view lab/test results, encounter notes, upcoming appointments, etc.  Non-urgent messages can be sent to your  provider as well.   To learn more about what you can do with MyChart, go to [email or web address].

## 2020-07-20 LAB — BRAIN NATRIURETIC PEPTIDE: BNP: 787.2 pg/mL — ABNORMAL HIGH (ref 0.0–100.0)

## 2020-07-20 NOTE — Telephone Encounter (Signed)
Patient was seen 3/29 in regards to symptoms

## 2020-07-27 LAB — BASIC METABOLIC PANEL
BUN/Creatinine Ratio: 16 (ref 9–20)
BUN: 19 mg/dL (ref 6–24)
CO2: 26 mmol/L (ref 20–29)
Calcium: 9.8 mg/dL (ref 8.7–10.2)
Chloride: 98 mmol/L (ref 96–106)
Creatinine, Ser: 1.16 mg/dL (ref 0.76–1.27)
Glucose: 288 mg/dL — ABNORMAL HIGH (ref 65–99)
Potassium: 5 mmol/L (ref 3.5–5.2)
Sodium: 141 mmol/L (ref 134–144)
eGFR: 73 mL/min/{1.73_m2} (ref >59)

## 2020-08-02 NOTE — Progress Notes (Signed)
Cardiology Clinic Note   Patient Name: [name] Date of Encounter: 08/04/2020  Primary Care Provider:  Concepcion Elk, MD Primary Cardiologist:  Kirk Ruths, MD  Patient Profile    [name] 60 year old male presents the clinic today for an evaluation of his acute combined systolic and diastolic CHF.  Past Medical History    Past Medical History:  Diagnosis Date  . Anxiety   . CAD (coronary artery disease)   . Chronic back pain   . Colon polyps   . Diabetes mellitus   . Heart attack ([place])   . Hyperlipidemia   . Hypertension   . Panic attacks   . PVD (peripheral vascular disease) ([place])    Past Surgical History:  Procedure Laterality Date  . APPLICATION OF A-CELL OF BACK N/A 12/09/2019   Procedure: APPLICATION OF A-CELL OF BACK;  Surgeon: Wonda Olds, MD;  Location: [facility];  Service: Thoracic;  Laterality: N/A;  . APPLICATION OF A-CELL OF CHEST/ABDOMEN N/A 12/04/2019   Procedure: APPLICATION OF A-CELL OF CHEST;  Surgeon: Wonda Olds, MD;  Location: [facility];  Service: Thoracic;  Laterality: N/A;  . APPLICATION OF WOUND VAC N/A 11/27/2019   Procedure: APPLICATION OF WOUND VAC;  Surgeon: Ivin Poot, MD;  Location: [place];  Service: Cardiovascular;  Laterality: N/A;  Lower midline superficial sternal wound area.  . APPLICATION OF WOUND VAC N/A 12/09/2019   Procedure: WOUND VAC EXCHANGE;  Surgeon: Wonda Olds, MD;  Location: [place];  Service: Thoracic;  Laterality: N/A;  . APPLICATION OF WOUND VAC N/A 12/14/2019   Procedure: WOUND VAC CHANGE;  Surgeon: Wonda Olds, MD;  Location: [place];  Service: Thoracic;  Laterality: N/A;  . CORONARY ANGIOPLASTY WITH STENT PLACEMENT    . CORONARY ARTERY BYPASS GRAFT N/A 10/29/2019   Procedure: CORONARY ARTERY BYPASS GRAFTING (CABG) using LIMA to LAD; RIMA to PDA; and Left radial harvest vein to Circ.;  Surgeon: Wonda Olds, MD;  Location: [place];  Service: Open Heart Surgery;  Laterality: N/A;  . EAR  CYST EXCISION  12/10/2011   Procedure: CYST REMOVAL;  Surgeon: Gae Bon, DDS;  Location: [place];  Service: Oral Surgery;  Laterality: Left;  Left Mandible Cyst Removal  . IR THORACENTESIS ASP PLEURAL SPACE W/IMG GUIDE  10/27/2019  . MULTIPLE EXTRACTIONS WITH ALVEOLOPLASTY  12/10/2011   Procedure: MULTIPLE EXTRACION WITH ALVEOLOPLASTY;  Surgeon: Gae Bon, DDS;  Location: [place];  Service: Oral Surgery;  Laterality: N/A;  Right Maxillary Tuberosity Reduction; Right  Maxillary Buccal Exostosis; Bilateral Mandibular Tori   . PERIPHERAL VASCULAR CATHETERIZATION N/A 12/28/2014   Procedure: Abdominal Aortogram;  Surgeon: Serafina Mitchell, MD;  Location: [place] [facility];  Service: Cardiovascular;  Laterality: N/A;  . PERIPHERAL VASCULAR CATHETERIZATION N/A 12/20/2015   Procedure: Abdominal Aortogram;  Surgeon: Serafina Mitchell, MD;  Location: [place] [facility];  Service: Cardiovascular;  Laterality: N/A;  . PERIPHERAL VASCULAR CATHETERIZATION  12/20/2015   Procedure: Peripheral Vascular Balloon Angioplasty;  Surgeon: Serafina Mitchell, MD;  Location: [place] [facility];  Service: Cardiovascular;;  . RADIAL ARTERY HARVEST Left 10/29/2019   Procedure: RADIAL ARTERY HARVEST;  Surgeon: Wonda Olds, MD;  Location: [place];  Service: Open Heart Surgery;  Laterality: Left;  . RIGHT/LEFT HEART CATH AND CORONARY ANGIOGRAPHY N/A 10/28/2019   Procedure: RIGHT/LEFT HEART CATH AND CORONARY ANGIOGRAPHY;  Surgeon: Burnell Blanks, MD;  Location: [place] [facility];  Service: Cardiovascular;  Laterality:  N/A;  . STERNAL WOUND DEBRIDEMENT N/A 12/01/2019   Procedure: STERNAL WOUND DEBRIDEMENT and WOUND VAC CHANGE;  Surgeon: Wonda Olds, MD;  Location: [place];  Service: Thoracic;  Laterality: N/A;  . STERNAL WOUND DEBRIDEMENT N/A 12/04/2019   Procedure: STERNAL WOUND DEBRIDEMENT and WOUND VAC CHANGE;  Surgeon: Wonda Olds, MD;  Location: [place];  Service: Thoracic;  Laterality: N/A;  . STERNAL WOUND  DEBRIDEMENT N/A 12/09/2019   Procedure: STERNAL WOUND DEBRIDEMENT;  Surgeon: Wonda Olds, MD;  Location: [place];  Service: Thoracic;  Laterality: N/A;  . TEE WITHOUT CARDIOVERSION N/A 10/29/2019   Procedure: TRANSESOPHAGEAL ECHOCARDIOGRAM (TEE);  Surgeon: Wonda Olds, MD;  Location: [place];  Service: Open Heart Surgery;  Laterality: N/A;  . WOUND EXPLORATION N/A 11/27/2019   Procedure: EXCISIONAL DEBRIDEMENT OF SUPERFICIAL STERNAL WOUND EXPLORATION;  Surgeon: Ivin Poot, MD;  Location: [place];  Service: Cardiovascular;  Laterality: N/A;  Superficial sternal wound    Allergies  Allergies  Allergen Reactions  . Codeine Shortness Of Breath and Itching  . Hydrocodone Shortness Of Breath, Nausea Only and Rash    Breathing problems ALSO  . Gabapentin Other (See Comments)    Suicidal thoughts, extreme dreams  . Pregabalin Other (See Comments)    Suicidal thoughts, extreme dreams   . Simvastatin Other (See Comments)    Pain in joints  . Lisinopril Itching and Rash  . Venlafaxine Rash    History of Present Illness    Mr. Tetzloff is a PMH of CAD status post PCI 2005 and 2007.  He was admitted 7/21 with anasarca.  He had shortness of breath and scrotal edema.  His echocardiogram showed 30-35% EF with grade 3 diastolic dysfunction.  He underwent cardiac catheterization which showed significant progression of his coronary artery disease with an occluded RCA 80% circumflex, 99% mid LAD, 90% diagonal, 80% OM with in-stent restenosis and 90% diagonal.  He underwent CABG x2 LIMA-LAD and RIMA-PDA 10/29/2019.  He was noted to have postoperative atrial fibrillation and was loaded with amiodarone and initiated on apixaban.  He was fluid volume overloaded and diuresed 23 L during that admission as well as right thoracentesis 10/27/2019.  He was admitted 8/21 with sternal wound infection and required debridement and wound VAC.  It eventually healed.  However, he unfortunately did not follow-up with  cardiology.  He ran out of his medications and reported he felt like he did not need them.  He was admitted 2/22 with acute CHF.  His EKG showed normal sinus rhythm.  He was diuresed by hospital service and not seen by cardiology.  He was last seen by Kerin Ransom, PA-C on 06/13/2020.  During that time he was doing well.  He was tolerating his medications.  His weight was noted to be 199 pounds which was up from his 10/21 weight of 167 pounds.  He contacted the nurse triage line 07/18/2020 and reported he was having increased shortness of breath with increased physical activity.  He reported compliance with his furosemide.  He presented to the clinic 07/19/2020 for evaluation and stated over the last few days he had noticed increased shortness of breath with increased physical activity.  He reported that he had been taking his medication as prescribed.  However, he had been having troubles with his pharmacy filling his medications.  He had not noticed any increased lower extremity swelling but had noticed some increased abdominal fullness.  He reported that he had been eating saltier foods recently  and had 2 hotdogs over weekend.  We reviewed the importance of daily weights and low-salt diet.  I  doubled his Lasix for 3 days and doubled his supplemental potassium for 3 days, ordered BMP for 1 week, planned follow-up in 1 to 2 weeks, and gave the salty 6 diet sheet.   His BMP showed stable creatinine and elevated glucose at 288.  His BNP was 787.2 on 07/19/2020  He presents the clinic today for follow-up evaluation states he feels well.  He continues to wean his nighttime oxygen at home.  He is now using his oxygen intermittently.  He reports that he does have some shortness of breath with increased physical activity but that he is breathing normally with normal daily activities.  He does not have any increased lower extremity edema today.  He reports that he is trying to follow a low-sodium diet.  He has  been caring for his mother and has been needing to go back and forth from the hospital.  We reviewed the salty 6 diet sheet, daily weights, and I will have him continue to increase physical activity as tolerated.  We will have him keep his follow-up appointment with Dr. Stanford Breed.  Today he denies chest pain,  lower extremity edema, fatigue, palpitations, melena, hematuria, hemoptysis, diaphoresis, weakness, presyncope, syncope, and PND.  Home Medications    Prior to Admission medications   Medication Sig Start Date End Date Taking? Authorizing Provider  amLODipine (NORVASC) 10 MG tablet Take 1 tablet (10 mg total) by mouth daily. 07/19/20 08/18/20  Deberah Pelton, NP  apixaban (ELIQUIS) 5 MG TABS tablet Take 1 tablet (5 mg total) by mouth 2 (two) times daily. 07/19/20 08/18/20  Deberah Pelton, NP  atorvastatin (LIPITOR) 40 MG tablet Take 1 tablet (40 mg total) by mouth daily. 07/19/20 08/18/20  Deberah Pelton, NP  carvedilol (COREG) 25 MG tablet Take 1 tablet (25 mg total) by mouth 2 (two) times daily. 07/19/20 10/17/20  Deberah Pelton, NP  empagliflozin (JARDIANCE) 25 MG TABS tablet Take 1 tablet (25 mg total) by mouth daily before breakfast. 07/19/20   Deberah Pelton, NP  empagliflozin (JARDIANCE) 25 MG TABS tablet TAKE 1 TABLET (25 MG TOTAL) BY MOUTH DAILY. 06/06/20 06/06/21  Donne Hazel, MD  furosemide (LASIX) 40 MG tablet Take 1 tablet (40 mg total) by mouth daily. TAKE 80MG  X3D THEN BACK TO 40MG  07/19/20 08/18/20  Deberah Pelton, NP  metoprolol succinate (TOPROL-XL) 25 MG 24 hr tablet Take 1 tablet (25 mg total) by mouth daily. 07/19/20 08/18/20  Deberah Pelton, NP  Multiple Vitamins-Minerals (CENTRUM SILVER) tablet Take 1 tablet by mouth 2 (two) times a week.    [provider]  OXYGEN Inhale 2-3 L/min into the lungs at bedtime as needed (for shortness of breath).    [provider]  potassium chloride (KLOR-CON) 10 MEQ tablet Take 1 tablet (10 mEq total) by mouth daily.  TAKE 20MEQ x3D THEN BACK TO 10MEQ 07/19/20 08/18/20  Deberah Pelton, NP  sacubitril-valsartan (ENTRESTO) 49-51 MG Take 1 tablet by mouth 2 (two) times daily. 07/19/20   Deberah Pelton, NP  losartan (COZAAR) 100 MG tablet Take 1 tablet (100 mg total) by mouth daily. 06/06/20 06/08/20  Donne Hazel, MD    Family History    Family History  Problem Relation Age of Onset  . Diabetes Father   . Kidney failure Father   . Hypertension Father   . Hyperlipidemia Father   .  Heart disease Father    He indicated that his mother is alive. He indicated that his father is deceased.  Social History    Social History   Socioeconomic History  . Marital status: Legally Separated    Spouse name: Not on file  . Number of children: 3  . Years of education: Not on file  . Highest education level: Not on file  Occupational History    Comment: Unemployed  Tobacco Use  . Smoking status: Former Smoker    Packs/day: 0.25    Years: 25.00    Pack years: 6.25    Types: Cigarettes    Start date: 69    Quit date: 10/23/2019    Years since quitting: 0.7  . Smokeless tobacco: Never Used  . Tobacco comment: 1/4 pack per day  Vaping Use  . Vaping Use: Never used  Substance and Sexual Activity  . Alcohol use: No    Alcohol/week: 0.0 standard drinks  . Drug use: No  . Sexual activity: Not on file  Other Topics Concern  . Not on file  Social History Narrative  . Not on file   Social Determinants of Health   Financial Resource Strain: Low Risk   . Difficulty of Paying Living Expenses: Not very hard  Food Insecurity: No Food Insecurity  . Worried About Charity fundraiser in the Last Year: Never true  . Ran Out of Food in the Last Year: Never true  Transportation Needs: No Transportation Needs  . Lack of Transportation (Medical): No  . Lack of Transportation (Non-Medical): No  Physical Activity: Insufficiently Active  . Days of Exercise per Week: 1 day  . Minutes of Exercise per Session: 30  min  Stress: Not on file  Social Connections: Socially Isolated  . Frequency of Communication with Friends and Family: More than three times a week  . Frequency of Social Gatherings with Friends and Family: More than three times a week  . Attends Religious Services: Never  . Active Member of Clubs or Organizations: No  . Attends Archivist Meetings: Never  . Marital Status: Divorced  Human resources officer Violence: Not on file     Review of Systems    General:  No chills, fever, night sweats or weight changes.  Cardiovascular:  No chest pain, dyspnea on exertion, edema, orthopnea, palpitations, paroxysmal nocturnal dyspnea. Dermatological: No rash, lesions/masses Respiratory: No cough, dyspnea Urologic: No hematuria, dysuria Abdominal:   No nausea, vomiting, diarrhea, bright red blood per rectum, melena, or hematemesis Neurologic:  No visual changes, wkns, changes in mental status. All other systems reviewed and are otherwise negative except as noted above.  Physical Exam    VS:  BP 110/70   Pulse (!) 55   Ht 5\' 7"  (1.702 m)   Wt 208 lb 3.2 oz (94.4 kg)   SpO2 93%   BMI 32.61 kg/m  , BMI Body mass index is 32.61 kg/m. GEN: Well nourished, well developed, in no acute distress. HEENT: normal. Neck: Supple, no JVD, carotid bruits, or masses. Cardiac: RRR, no murmurs, rubs, or gallops. No clubbing, cyanosis, edema.  Radials/DP/PT 2+ and equal bilaterally.  Respiratory:  Respirations regular and unlabored, clear to auscultation bilaterally. GI: Soft, nontender, nondistended, BS + x 4. MS: no deformity or atrophy. Skin: warm and dry, no rash. Neuro:  Strength and sensation are intact. Psych: Normal affect.  Accessory Clinical Findings    Recent Labs: 11/26/2019: Magnesium 1.9 11/29/2019: ALT 22 06/03/2020: Hemoglobin 16.7; Platelets 401  07/19/2020: BNP 787.2 07/26/2020: BUN 19; Creatinine, Ser 1.16; Potassium 5.0; Sodium 141   Recent Lipid Panel    Component Value  Date/Time   CHOL 135 10/27/2019 0428   TRIG 89 10/27/2019 0428   HDL 34 (L) 10/27/2019 0428   CHOLHDL 4.0 10/27/2019 0428   VLDL 18 10/27/2019 0428   LDLCALC 83 10/27/2019 0428    ECG personally reviewed by me today-none today. Echocardiogram 06/04/2020 IMPRESSIONS    1. Left ventricular ejection fraction, by estimation, is 25 to 30%. The  left ventricle has severely decreased function. The left ventricle  demonstrates regional wall motion abnormalities (see scoring  diagram/findings for description). There is mild  concentric left ventricular hypertrophy. Left ventricular diastolic  parameters are consistent with Grade III diastolic dysfunction  (restrictive).  2. Right ventricular systolic function is moderately reduced. The right  ventricular size is normal. There is normal pulmonary artery systolic  pressure. The estimated right ventricular systolic pressure is 25.9 mmHg.  3. Left atrial size was severely dilated.  4. The mitral valve is normal in structure. No evidence of mitral valve  regurgitation. No evidence of mitral stenosis.  5. Tricuspid regurgitation is eccentric; mild but may be underestimated  in this study.  6. The aortic valve is grossly normal. Aortic valve regurgitation is not  visualized.  7. The inferior vena cava is normal in size with greater than 50%  respiratory variability, suggesting right atrial pressure of 3 mmHg.   Comparison(s): A prior study was performed on 12/01/19. Prior images  reviewed side by side. Pleural effusion has resolved, right ventricular  function is worse, left ventricular function is similar.   Assessment & Plan   1.  Acute on chronic systolic and diastolic CHF-euvolemic today.  Previously contacted nurse triage line 07/18/2020 and reported that he was having increased dyspnea with increased physical activity.  He reports compliance with his medications.  Weight on 2/ 21/22 to 199 pounds.  Reports increased abdominal  fullness. Continue metoprolol, Entresto, furosemide, potassium May take an extra dose of furosemide for weight increase of 3 pounds overnight or 5 pounds in a week. Heart healthy low-sodium diet-salty 6 given Increase physical activity as tolerated Daily weights-contact office with a weight increase of 3 pounds overnight or 5 pounds in 1 week. Continue blood pressure log  Coronary artery disease-no chest pain .  Status post CABG x2 10/29/2019 Continue amlodipine, metoprolol, Entresto Heart healthy low-sodium diet-salty 6 given Increase physical activity as tolerated  Ischemic cardiomyopathy-echocardiogram 2/22 showed EF 25-30%.  No increased dyspnea with increased physical activity. Continue Entresto, metoprolol, amlodipine Heart healthy low-sodium diet-salty 6 given Increase physical activity as tolerated  Essential hypertension-BP today  110/70.  Did not take medications this morning.  Well-controlled at home. Continue amlodipine, furosemide, metoprolol, Entresto Heart healthy low-sodium diet-salty 6 given Increase physical activity as tolerated  Paroxysmal atrial fibrillation-heart rate today 55.  Denies bleeding issues. Continue apixaban Heart healthy low-sodium diet-salty 6 given Increase physical activity as tolerated  Disposition: Follow-up with  Dr. Stanford Breed in 2 months as scheduled.   Jossie Ng. Bessye Stith NP-C    08/04/2020, 11:25 AM [address] Office [phone] Fax [phone]  Notice: This dictation was prepared with Dragon dictation along with smaller phrase technology. Any transcriptional errors that result from this process are unintentional and may not be corrected upon review.  I spent 11 minutes examining this patient, reviewing medications, and using patient centered shared decision making involving her cardiac  care.  Prior to her visit I spent greater than 20 minutes reviewing her past medical history,   medications, and prior cardiac tests.

## 2020-08-04 ENCOUNTER — Encounter: Payer: Self-pay | Admitting: General Practice

## 2020-08-04 ENCOUNTER — Other Ambulatory Visit: Payer: Self-pay

## 2020-08-04 ENCOUNTER — Ambulatory Visit (INDEPENDENT_AMBULATORY_CARE_PROVIDER_SITE_OTHER): Payer: Medicaid Other | Admitting: General Practice

## 2020-08-04 VITALS — BP 110/70 | HR 55 | Ht 67.0 in | Wt 208.2 lb

## 2020-08-04 DIAGNOSIS — I2510 Atherosclerotic heart disease of native coronary artery without angina pectoris: Secondary | ICD-10-CM | POA: Diagnosis not present

## 2020-08-04 DIAGNOSIS — I255 Ischemic cardiomyopathy: Secondary | ICD-10-CM | POA: Diagnosis not present

## 2020-08-04 DIAGNOSIS — I5041 Acute combined systolic (congestive) and diastolic (congestive) heart failure: Secondary | ICD-10-CM

## 2020-08-04 DIAGNOSIS — I10 Essential (primary) hypertension: Secondary | ICD-10-CM | POA: Diagnosis not present

## 2020-08-04 DIAGNOSIS — Z9861 Coronary angioplasty status: Secondary | ICD-10-CM

## 2020-08-04 DIAGNOSIS — I480 Paroxysmal atrial fibrillation: Secondary | ICD-10-CM

## 2020-08-04 NOTE — Patient Instructions (Signed)
Medication Instructions:  The current medical regimen is effective;  continue present plan and medications as directed. Please refer to the Current Medication list given to you today.; *If you need a refill on your cardiac medications before your next appointment, please call your pharmacy*  Special Instructions TAKE AND LOG YOUR WEIGHT DAILY AND BRING WITH YOU TO YOUR FOLLOW UP APPOINTMENT.  MAY TAKE EXTRA FUROSEMIDE FOR WEIGHT GAIN >3 POUNDS/DAY FOR 5 POUNDS/WEEKLY  PLEASE READ AND FOLLOW SALTY 6-ATTACHED-1,800mg  daily  Follow-Up: Your next appointment:  KEEP SCHEDULED APPOINTMENT  In Person with Kirk Ruths, MD   At [facility], you and your health needs are our priority.  As part of our continuing mission to provide you with exceptional heart care, we have created designated Provider Care Teams.  These Care Teams include your primary Cardiologist (physician) and Advanced Practice Providers (APPs -  Physician Assistants and Nurse Practitioners) who all work together to provide you with the care you need, when you need it.  We recommend signing up for the patient portal called "MyChart".  Sign up information is provided on this After Visit Summary.  MyChart is used to connect with patients for Virtual Visits (Telemedicine).  Patients are able to view lab/test results, encounter notes, upcoming appointments, etc.  Non-urgent messages can be sent to your provider as well.   To learn more about what you can do with MyChart, go to [email or web address].              6 SALTY THINGS TO AVOID     1,800MG  DAILY

## 2020-08-12 NOTE — Progress Notes (Signed)
HPI: Follow-up coronary artery disease and congestive heart failure.  Patient admitted with congestive heart failure July 2021.  Cardiac catheterization showed severe three-vessel coronary artery disease.  Carotid Dopplers July 2021 showed 80 to 99% right carotid and 40 to 59% left carotid stenosis.  ABIs showed moderate reduction on the right and mild on the left.  He had coronary artery bypass graft with a LIMA to the LAD and RIMA to the PDA July 2021.  Postoperative course complicated by atrial fibrillation.  Also with significant volume overload requiring diuresis and right thoracentesis.  Most recent echocardiogram 2/12 showed ejection fraction 25 to 30%, mild left ventricular hypertrophy, restrictive filling, moderate RV dysfunction, severe left atrial enlargement, mild tricuspid regurgitation.  CTA February 2022 showed bilateral pleural effusions, mild interstitial edema.  Since last seen denies increased dyspnea, chest pain, palpitations or syncope.  No increased pedal edema.  Current Outpatient Medications  Medication Sig Dispense Refill  . amLODipine (NORVASC) 10 MG tablet Take 1 tablet (10 mg total) by mouth daily. 90 tablet 3  . apixaban (ELIQUIS) 5 MG TABS tablet Take 1 tablet (5 mg total) by mouth 2 (two) times daily. 180 tablet 3  . atorvastatin (LIPITOR) 40 MG tablet Take 1 tablet (40 mg total) by mouth daily. 90 tablet 3  . carvedilol (COREG) 25 MG tablet Take 1 tablet (25 mg total) by mouth 2 (two) times daily. 180 tablet 3  . Dulaglutide (TRULICITY [place]) Inject into the skin. Pt not sure of the dose    . empagliflozin (JARDIANCE) 25 MG TABS tablet Take 1 tablet (25 mg total) by mouth daily before breakfast. 30 tablet   . empagliflozin (JARDIANCE) 25 MG TABS tablet TAKE 1 TABLET (25 MG TOTAL) BY MOUTH DAILY. 30 tablet 0  . furosemide (LASIX) 40 MG tablet Take 1 tablet (40 mg total) by mouth daily. TAKE 80MG  X3D THEN BACK TO 40MG  93 tablet 3  . metoprolol succinate (TOPROL-XL) 25  MG 24 hr tablet Take 1 tablet (25 mg total) by mouth daily. 90 tablet 3  . Multiple Vitamins-Minerals (CENTRUM SILVER) tablet Take 1 tablet by mouth 2 (two) times a week.    . OXYGEN Inhale 2-3 L/min into the lungs at bedtime as needed (for shortness of breath).    . potassium chloride (KLOR-CON) 10 MEQ tablet Take 1 tablet (10 mEq total) by mouth daily. TAKE 20MEQ x3D THEN BACK TO 10MEQ 30 tablet 0  . sacubitril-valsartan (ENTRESTO) 49-51 MG Take 1 tablet by mouth 2 (two) times daily. 60 tablet 6  . TRESIBA FLEXTOUCH 100 UNIT/ML FlexTouch Pen Inject 30 Units into the skin.     No current facility-administered medications for this visit.     Past Medical History:  Diagnosis Date  . Anxiety   . CAD (coronary artery disease)   . Chronic back pain   . Colon polyps   . Diabetes mellitus   . Heart attack ([place])   . Hyperlipidemia   . Hypertension   . Panic attacks   . PVD (peripheral vascular disease) ([place])     Past Surgical History:  Procedure Laterality Date  . APPLICATION OF A-CELL OF BACK N/A 12/09/2019   Procedure: APPLICATION OF A-CELL OF BACK;  Surgeon: Wonda Olds, MD;  [facility];  Service: Thoracic;  Laterality: N/A;  . APPLICATION OF A-CELL OF CHEST/ABDOMEN N/A 12/04/2019   Procedure: APPLICATION OF A-CELL OF CHEST;  Surgeon: Wonda Olds, MD;  Location: [place];  Service: Thoracic;  Laterality: N/A;  . APPLICATION OF WOUND VAC N/A 11/27/2019   Procedure: APPLICATION OF WOUND VAC;  Surgeon: Prescott Gum, Collier Salina, MD;  Location: [place];  Service: Cardiovascular;  Laterality: N/A;  Lower midline superficial sternal wound area.  . APPLICATION OF WOUND VAC N/A 12/09/2019   Procedure: WOUND VAC EXCHANGE;  Surgeon: Wonda Olds, MD;  Location: [place];  Service: Thoracic;  Laterality: N/A;  . APPLICATION OF WOUND VAC N/A 12/14/2019   Procedure: WOUND VAC CHANGE;  Surgeon: Wonda Olds, MD;  Location: [place];  Service: Thoracic;  Laterality: N/A;  . CORONARY ANGIOPLASTY  WITH STENT PLACEMENT    . CORONARY ARTERY BYPASS GRAFT N/A 10/29/2019   Procedure: CORONARY ARTERY BYPASS GRAFTING (CABG) using LIMA to LAD; RIMA to PDA; and Left radial harvest vein to Circ.;  Surgeon: Wonda Olds, MD;  Location: [place];  Service: Open Heart Surgery;  Laterality: N/A;  . EAR CYST EXCISION  12/10/2011   Procedure: CYST REMOVAL;  Surgeon: Gae Bon, DDS;  Location: [place];  Service: Oral Surgery;  Laterality: Left;  Left Mandible Cyst Removal  . IR THORACENTESIS ASP PLEURAL SPACE W/IMG GUIDE  10/27/2019  . MULTIPLE EXTRACTIONS WITH ALVEOLOPLASTY  12/10/2011   Procedure: MULTIPLE EXTRACION WITH ALVEOLOPLASTY;  Surgeon: Gae Bon, DDS;  Location: [place];  Service: Oral Surgery;  Laterality: N/A;  Right Maxillary Tuberosity Reduction; Right  Maxillary Buccal Exostosis; Bilateral Mandibular Tori   . PERIPHERAL VASCULAR CATHETERIZATION N/A 12/28/2014   Procedure: Abdominal Aortogram;  Surgeon: Serafina Mitchell, MD;  Location: [place] [facility];  Service: Cardiovascular;  Laterality: N/A;  . PERIPHERAL VASCULAR CATHETERIZATION N/A 12/20/2015   Procedure: Abdominal Aortogram;  Surgeon: Serafina Mitchell, MD;  Location: [place] [facility];  Service: Cardiovascular;  Laterality: N/A;  . PERIPHERAL VASCULAR CATHETERIZATION  12/20/2015   Procedure: Peripheral Vascular Balloon Angioplasty;  Surgeon: Serafina Mitchell, MD;  Location: [place] [facility];  Service: Cardiovascular;;  . RADIAL ARTERY HARVEST Left 10/29/2019   Procedure: RADIAL ARTERY HARVEST;  Surgeon: Wonda Olds, MD;  Location: [place];  Service: Open Heart Surgery;  Laterality: Left;  . RIGHT/LEFT HEART CATH AND CORONARY ANGIOGRAPHY N/A 10/28/2019   Procedure: RIGHT/LEFT HEART CATH AND CORONARY ANGIOGRAPHY;  Surgeon: Burnell Blanks, MD;  Location: [place] [facility];  Service: Cardiovascular;  Laterality: N/A;  . STERNAL WOUND DEBRIDEMENT N/A 12/01/2019   Procedure: STERNAL WOUND DEBRIDEMENT and WOUND VAC CHANGE;   Surgeon: Wonda Olds, MD;  Location: [place];  Service: Thoracic;  Laterality: N/A;  . STERNAL WOUND DEBRIDEMENT N/A 12/04/2019   Procedure: STERNAL WOUND DEBRIDEMENT and WOUND VAC CHANGE;  Surgeon: Wonda Olds, MD;  Location: [place];  Service: Thoracic;  Laterality: N/A;  . STERNAL WOUND DEBRIDEMENT N/A 12/09/2019   Procedure: STERNAL WOUND DEBRIDEMENT;  Surgeon: Wonda Olds, MD;  Location: [place];  Service: Thoracic;  Laterality: N/A;  . TEE WITHOUT CARDIOVERSION N/A 10/29/2019   Procedure: TRANSESOPHAGEAL ECHOCARDIOGRAM (TEE);  Surgeon: Wonda Olds, MD;  Location: [place];  Service: Open Heart Surgery;  Laterality: N/A;  . WOUND EXPLORATION N/A 11/27/2019   Procedure: EXCISIONAL DEBRIDEMENT OF SUPERFICIAL STERNAL WOUND EXPLORATION;  Surgeon: Ivin Poot, MD;  Location: [facility];  Service: Cardiovascular;  Laterality: N/A;  Superficial sternal wound    Social History   Socioeconomic History  . Marital status: Legally Separated    Spouse name: Not on file  . Number of children: 3  .  Years of education: Not on file  . Highest education level: Not on file  Occupational History    Comment: Unemployed  Tobacco Use  . Smoking status: Former Smoker    Packs/day: 0.25    Years: 25.00    Pack years: 6.25    Types: Cigarettes    Start date: 53    Quit date: 10/23/2019    Years since quitting: 0.8  . Smokeless tobacco: Never Used  . Tobacco comment: 1/4 pack per day  Vaping Use  . Vaping Use: Never used  Substance and Sexual Activity  . Alcohol use: No    Alcohol/week: 0.0 standard drinks  . Drug use: No  . Sexual activity: Not on file  Other Topics Concern  . Not on file  Social History Narrative  . Not on file   Social Determinants of Health   Financial Resource Strain: Low Risk   . Difficulty of Paying Living Expenses: Not very hard  Food Insecurity: No Food Insecurity  . Worried About Charity fundraiser in the Last Year: Never true  . Ran Out of Food in the  Last Year: Never true  Transportation Needs: No Transportation Needs  . Lack of Transportation (Medical): No  . Lack of Transportation (Non-Medical): No  Physical Activity: Insufficiently Active  . Days of Exercise per Week: 1 day  . Minutes of Exercise per Session: 30 min  Stress: Not on file  Social Connections: Socially Isolated  . Frequency of Communication with Friends and Family: More than three times a week  . Frequency of Social Gatherings with Friends and Family: More than three times a week  . Attends Religious Services: Never  . Active Member of Clubs or Organizations: No  . Attends Archivist Meetings: Never  . Marital Status: Divorced  Human resources officer Violence: Not on file    Family History  Problem Relation Age of Onset  . Diabetes Father   . Kidney failure Father   . Hypertension Father   . Hyperlipidemia Father   . Heart disease Father     ROS: no fevers or chills, productive cough, hemoptysis, dysphasia, odynophagia, melena, hematochezia, dysuria, hematuria, rash, seizure activity, orthopnea, PND, pedal edema, claudication. Remaining systems are negative.  Physical Exam: Well-developed well-nourished in no acute distress.  Skin is warm and dry.  HEENT is normal.  Neck is supple.  Chest is clear to auscultation with normal expansion.  Cardiovascular exam is regular rate and rhythm.  Abdominal exam nontender or distended. No masses palpated. Extremities show no edema. neuro grossly intact  A/P  1 coronary artery disease status post coronary artery bypass graft-plan to continue medical therapy with aspirin and statin.  2 ischemic cardiomyopathy-increase Entresto to 97/103 twice daily.  Continue carvedilol and discontinue Toprol.  We will need to refer to electrophysiology for ICD given continued severe LV dysfunction.  3 chronic systolic congestive heart failure-patient appears to be euvolemic on examination.  Continue Lasix at present dose.   Discontinue potassium.  Add spironolactone 25 mg daily.  In 1 week check potassium and renal function.  Continue fluid restriction and low-sodium diet.  4 postoperative atrial fibrillation-patient did not have atrial fibrillation at the time of recent admission but did have postoperative atrial fibrillation.  He remains in sinus rhythm on examination.  However he states he did have atrial fibrillation in the past though this is not clear.  We will consider discontinuing apixaban in the future but for now we will continue.  5  hypertension-blood pressure controlled.  Given increase dose of Entresto will decrease amlodipine from 10 to 5 mg daily.  Ultimately we will wean to off.  6 hyperlipidemia-continue statin.  7 Carotid artery disease-patient with severe carotid disease prior to coronary artery bypass graft.  We will arrange evaluation with vascular surgery.  8 peripheral vascular disease-continue medical therapy.  No claudication at present.  Kirk Ruths, MD

## 2020-08-19 ENCOUNTER — Other Ambulatory Visit: Payer: Self-pay

## 2020-08-19 ENCOUNTER — Ambulatory Visit (INDEPENDENT_AMBULATORY_CARE_PROVIDER_SITE_OTHER): Payer: Medicaid Other | Admitting: Cardiology

## 2020-08-19 ENCOUNTER — Encounter: Payer: Self-pay | Admitting: Cardiology

## 2020-08-19 VITALS — BP 118/82 | HR 60 | Ht 67.0 in | Wt 208.4 lb

## 2020-08-19 DIAGNOSIS — I255 Ischemic cardiomyopathy: Secondary | ICD-10-CM

## 2020-08-19 DIAGNOSIS — Z951 Presence of aortocoronary bypass graft: Secondary | ICD-10-CM | POA: Diagnosis not present

## 2020-08-19 DIAGNOSIS — I480 Paroxysmal atrial fibrillation: Secondary | ICD-10-CM

## 2020-08-19 DIAGNOSIS — I10 Essential (primary) hypertension: Secondary | ICD-10-CM

## 2020-08-19 DIAGNOSIS — I6523 Occlusion and stenosis of bilateral carotid arteries: Secondary | ICD-10-CM

## 2020-08-19 MED ORDER — AMLODIPINE BESYLATE 5 MG PO TABS: 5.0000 mg | ORAL_TABLET | Freq: Every day | ORAL | 3 refills | Status: DC

## 2020-08-19 MED ORDER — SACUBITRIL-VALSARTAN 97-103 MG PO TABS: 1.0000 | ORAL_TABLET | Freq: Two times a day (BID) | ORAL | 3 refills | Status: DC

## 2020-08-19 MED ORDER — SPIRONOLACTONE 25 MG PO TABS: 25.0000 mg | ORAL_TABLET | Freq: Every day | ORAL | 3 refills | Status: DC

## 2020-08-19 NOTE — Patient Instructions (Signed)
Medication Instructions:   DECREASE AMLODIPINE TO 5 MG ONCE DAILY= 1/2 OF THE 10 MG TABLET ONCE DAILY  STOP METOPROLOL  STOP POTASSIUM  START SPIRONOLACTONE 25 MG ONCE DAILY  INCREASE ENTRESTO TO 97-103 MG TWICE DAILY= 2 OF THE 49-51 MG TABLETS TWICE DAILY  *If you need a refill on your cardiac medications before your next appointment, please call your pharmacy*   Lab Work:  Your physician recommends that you return for lab work in:[facility]  If you have labs (blood work) drawn today and your tests are completely normal, you will receive your results only by: [name] Kitchen MyChart Message (if you have MyChart) OR . A paper copy in the mail If you have any lab test that is abnormal or we need to change your treatment, we will call you to review the results.   Follow-Up: At [facility], you and your health needs are our priority.  As part of our continuing mission to provide you with exceptional heart care, we have created designated Provider Care Teams.  These Care Teams include your primary Cardiologist (physician) and Advanced Practice Providers (APPs -  Physician Assistants and Nurse Practitioners) who all work together to provide you with the care you need, when you need it.  We recommend signing up for the patient portal called "MyChart".  Sign up information is provided on this After Visit Summary.  MyChart is used to connect with patients for Virtual Visits (Telemedicine).  Patients are able to view lab/test results, encounter notes, upcoming appointments, etc.  Non-urgent messages can be sent to your provider as well.   To learn more about what you can do with MyChart, go to [email or web address].    Your next appointment:   3 month(s)  The format for your next appointment:   In Person  Provider:   Kirk Ruths, MD

## 2020-09-05 ENCOUNTER — Other Ambulatory Visit: Payer: Self-pay

## 2020-09-05 DIAGNOSIS — I6521 Occlusion and stenosis of right carotid artery: Secondary | ICD-10-CM

## 2020-09-05 DIAGNOSIS — I739 Peripheral vascular disease, unspecified: Secondary | ICD-10-CM

## 2020-09-13 ENCOUNTER — Other Ambulatory Visit: Payer: Self-pay

## 2020-09-13 ENCOUNTER — Encounter: Payer: Self-pay | Admitting: Internal Medicine

## 2020-09-13 ENCOUNTER — Ambulatory Visit (INDEPENDENT_AMBULATORY_CARE_PROVIDER_SITE_OTHER): Payer: Medicaid Other | Admitting: Internal Medicine

## 2020-09-13 VITALS — BP 98/62 | HR 54 | Ht 67.0 in | Wt 215.4 lb

## 2020-09-13 DIAGNOSIS — I255 Ischemic cardiomyopathy: Secondary | ICD-10-CM | POA: Diagnosis not present

## 2020-09-13 NOTE — Progress Notes (Signed)
HPI [name] is referred by Dr. Stanford Breed for consideration for ICD implantation for primary prevention of malignant ventricular arrhythmias. He has never had syncope and is s/p CABG complicated by wound healing problems. He has not had recent anginal symptoms. The patient underwent CABG in 7/21. He previously undewent coronary stenting in 2005 and 2007. He was hospitalized with CHF in February. He has done well since then. He denies edema.  Allergies  Allergen Reactions  . Codeine Shortness Of Breath and Itching  . Hydrocodone Shortness Of Breath, Nausea Only and Rash    Breathing problems ALSO  . Gabapentin Other (See Comments)    Suicidal thoughts, extreme dreams  . Pregabalin Other (See Comments)    Suicidal thoughts, extreme dreams   . Simvastatin Other (See Comments)    Pain in joints  . Lisinopril Itching and Rash  . Venlafaxine Rash     Current Outpatient Medications  Medication Sig Dispense Refill  . amLODipine (NORVASC) 5 MG tablet Take 1 tablet (5 mg total) by mouth daily. 180 tablet 3  . carvedilol (COREG) 25 MG tablet Take 1 tablet (25 mg total) by mouth 2 (two) times daily. 180 tablet 3  . Dulaglutide (TRULICITY [place]) Inject into the skin. Pt not sure of the dose    . empagliflozin (JARDIANCE) 25 MG TABS tablet Take 1 tablet (25 mg total) by mouth daily before breakfast. 30 tablet   . empagliflozin (JARDIANCE) 25 MG TABS tablet TAKE 1 TABLET (25 MG TOTAL) BY MOUTH DAILY. 30 tablet 0  . Multiple Vitamins-Minerals (CENTRUM SILVER) tablet Take 1 tablet by mouth 2 (two) times a week.    . OXYGEN Inhale 2-3 L/min into the lungs at bedtime as needed (for shortness of breath).    . sacubitril-valsartan (ENTRESTO) 97-103 MG Take 1 tablet by mouth 2 (two) times daily. 180 tablet 3  . spironolactone (ALDACTONE) 25 MG tablet Take 1 tablet (25 mg total) by mouth daily. 90 tablet 3  . TRESIBA FLEXTOUCH 100 UNIT/ML FlexTouch Pen Inject 30 Units into the skin.    [name] Kitchen apixaban  (ELIQUIS) 5 MG TABS tablet Take 1 tablet (5 mg total) by mouth 2 (two) times daily. 180 tablet 3  . atorvastatin (LIPITOR) 40 MG tablet Take 1 tablet (40 mg total) by mouth daily. 90 tablet 3  . furosemide (LASIX) 40 MG tablet Take 1 tablet (40 mg total) by mouth daily. TAKE 80MG  X3D THEN BACK TO 40MG  93 tablet 3   No current facility-administered medications for this visit.     Past Medical History:  Diagnosis Date  . Anxiety   . CAD (coronary artery disease)   . Chronic back pain   . Colon polyps   . Diabetes mellitus   . Heart attack ([place])   . Hyperlipidemia   . Hypertension   . Panic attacks   . PVD (peripheral vascular disease) ([place])     ROS:   All systems reviewed and negative except as noted in the HPI.   Past Surgical History:  Procedure Laterality Date  . APPLICATION OF A-CELL OF BACK N/A 12/09/2019   Procedure: APPLICATION OF A-CELL OF BACK;  Surgeon: Wonda Olds, MD;  [facility];  Service: Thoracic;  Laterality: N/A;  . APPLICATION OF A-CELL OF CHEST/ABDOMEN N/A 12/04/2019   Procedure: APPLICATION OF A-CELL OF CHEST;  Surgeon: Wonda Olds, MD;  [facility];  Service: Thoracic;  Laterality: N/A;  . APPLICATION OF WOUND VAC N/A 11/27/2019  Procedure: APPLICATION OF WOUND VAC;  Surgeon: Prescott Gum, Collier Salina, MD;  Location: [place];  Service: Cardiovascular;  Laterality: N/A;  Lower midline superficial sternal wound area.  . APPLICATION OF WOUND VAC N/A 12/09/2019   Procedure: WOUND VAC EXCHANGE;  Surgeon: Wonda Olds, MD;  Location: [place];  Service: Thoracic;  Laterality: N/A;  . APPLICATION OF WOUND VAC N/A 12/14/2019   Procedure: WOUND VAC CHANGE;  Surgeon: Wonda Olds, MD;  Location: [place];  Service: Thoracic;  Laterality: N/A;  . CORONARY ANGIOPLASTY WITH STENT PLACEMENT    . CORONARY ARTERY BYPASS GRAFT N/A 10/29/2019   Procedure: CORONARY ARTERY BYPASS GRAFTING (CABG) using LIMA to LAD; RIMA to PDA; and Left radial harvest vein to Circ.;   Surgeon: Wonda Olds, MD;  Location: [place];  Service: Open Heart Surgery;  Laterality: N/A;  . EAR CYST EXCISION  12/10/2011   Procedure: CYST REMOVAL;  Surgeon: Gae Bon, DDS;  Location: [place];  Service: Oral Surgery;  Laterality: Left;  Left Mandible Cyst Removal  . IR THORACENTESIS ASP PLEURAL SPACE W/IMG GUIDE  10/27/2019  . MULTIPLE EXTRACTIONS WITH ALVEOLOPLASTY  12/10/2011   Procedure: MULTIPLE EXTRACION WITH ALVEOLOPLASTY;  Surgeon: Gae Bon, DDS;  Location: [place];  Service: Oral Surgery;  Laterality: N/A;  Right Maxillary Tuberosity Reduction; Right  Maxillary Buccal Exostosis; Bilateral Mandibular Tori   . PERIPHERAL VASCULAR CATHETERIZATION N/A 12/28/2014   Procedure: Abdominal Aortogram;  Surgeon: Serafina Mitchell, MD;  Location: [place] [facility];  Service: Cardiovascular;  Laterality: N/A;  . PERIPHERAL VASCULAR CATHETERIZATION N/A 12/20/2015   Procedure: Abdominal Aortogram;  Surgeon: Serafina Mitchell, MD;  Location: [place] [facility];  Service: Cardiovascular;  Laterality: N/A;  . PERIPHERAL VASCULAR CATHETERIZATION  12/20/2015   Procedure: Peripheral Vascular Balloon Angioplasty;  Surgeon: Serafina Mitchell, MD;  Location: [place] [facility];  Service: Cardiovascular;;  . RADIAL ARTERY HARVEST Left 10/29/2019   Procedure: RADIAL ARTERY HARVEST;  Surgeon: Wonda Olds, MD;  Location: [place];  Service: Open Heart Surgery;  Laterality: Left;  . RIGHT/LEFT HEART CATH AND CORONARY ANGIOGRAPHY N/A 10/28/2019   Procedure: RIGHT/LEFT HEART CATH AND CORONARY ANGIOGRAPHY;  Surgeon: Burnell Blanks, MD;  Location: [place] [facility];  Service: Cardiovascular;  Laterality: N/A;  . STERNAL WOUND DEBRIDEMENT N/A 12/01/2019   Procedure: STERNAL WOUND DEBRIDEMENT and WOUND VAC CHANGE;  Surgeon: Wonda Olds, MD;  Location: [place];  Service: Thoracic;  Laterality: N/A;  . STERNAL WOUND DEBRIDEMENT N/A 12/04/2019   Procedure: STERNAL WOUND DEBRIDEMENT and WOUND VAC CHANGE;   Surgeon: Wonda Olds, MD;  Location: [place];  Service: Thoracic;  Laterality: N/A;  . STERNAL WOUND DEBRIDEMENT N/A 12/09/2019   Procedure: STERNAL WOUND DEBRIDEMENT;  Surgeon: Wonda Olds, MD;  Location: [place];  Service: Thoracic;  Laterality: N/A;  . TEE WITHOUT CARDIOVERSION N/A 10/29/2019   Procedure: TRANSESOPHAGEAL ECHOCARDIOGRAM (TEE);  Surgeon: Wonda Olds, MD;  Location: [place];  Service: Open Heart Surgery;  Laterality: N/A;  . WOUND EXPLORATION N/A 11/27/2019   Procedure: EXCISIONAL DEBRIDEMENT OF SUPERFICIAL STERNAL WOUND EXPLORATION;  Surgeon: Ivin Poot, MD;  Location: [facility] OR;  Service: Cardiovascular;  Laterality: N/A;  Superficial sternal wound     Family History  Problem Relation Age of Onset  . Diabetes Father   . Kidney failure Father   . Hypertension Father   . Hyperlipidemia Father   . Heart disease Father  Social History   Socioeconomic History  . Marital status: Legally Separated    Spouse name: Not on file  . Number of children: 3  . Years of education: Not on file  . Highest education level: Not on file  Occupational History    Comment: Unemployed  Tobacco Use  . Smoking status: Former Smoker    Packs/day: 0.25    Years: 25.00    Pack years: 6.25    Types: Cigarettes    Start date: 18    Quit date: 10/23/2019    Years since quitting: 0.8  . Smokeless tobacco: Never Used  . Tobacco comment: 1/4 pack per day  Vaping Use  . Vaping Use: Never used  Substance and Sexual Activity  . Alcohol use: No    Alcohol/week: 0.0 standard drinks  . Drug use: No  . Sexual activity: Not on file  Other Topics Concern  . Not on file  Social History Narrative  . Not on file   Social Determinants of Health   Financial Resource Strain: Low Risk   . Difficulty of Paying Living Expenses: Not very hard  Food Insecurity: No Food Insecurity  . Worried About Charity fundraiser in the Last Year: Never true  . Ran Out of Food in the Last Year:  Never true  Transportation Needs: No Transportation Needs  . Lack of Transportation (Medical): No  . Lack of Transportation (Non-Medical): No  Physical Activity: Insufficiently Active  . Days of Exercise per Week: 1 day  . Minutes of Exercise per Session: 30 min  Stress: Not on file  Social Connections: Socially Isolated  . Frequency of Communication with Friends and Family: More than three times a week  . Frequency of Social Gatherings with Friends and Family: More than three times a week  . Attends Religious Services: Never  . Active Member of Clubs or Organizations: No  . Attends Archivist Meetings: Never  . Marital Status: Divorced  Human resources officer Violence: Not on file     BP 98/62   Pulse (!) 54   Ht 5\' 7"  (1.702 m)   Wt 215 lb 6.4 oz (97.7 kg)   SpO2 95%   BMI 33.74 kg/m   Physical Exam:  Well appearing middle aged man, NAD HEENT: Unremarkable Neck:  No JVD, no thyromegally Lymphatics:  No adenopathy Back:  No CVA tenderness Lungs:  Clear with no wheezes HEART:  Regular rate rhythm, no murmurs, no rubs, no clicks Abd:  soft, positive bowel sounds, no organomegally, no rebound, no guarding Ext:  2 plus pulses, no edema, no cyanosis, no clubbing Skin:  No rashes no nodules Neuro:  CN II through XII intact, motor grossly intact  EKG - sinus bradycardia with anterior MI   Assess/Plan: 1. Chronic systolic heart failure - his symptoms are class 2 on maximal medical therapy. 2. ICM - he denies anginal symptoms. 3. PAF - He appears to be maintaining NSR. He will continue eliquis. 4. Dyslipidemia - he will continue his statin.  Carleene Overlie Jacorey Donaway,MD

## 2020-09-13 NOTE — Patient Instructions (Addendum)
Medication Instructions:  Your physician recommends that you continue on your current medications as directed. Please refer to the Current Medication list given to you today.  Labwork: None ordered.  Testing/Procedures: None ordered.  Follow-Up:  The following dates are available for this procedure: July 6, 8, 18, 21, 25 August 8, 11, 15, 22, 26, 29  Any Other Special Instructions Will Be Listed Below (If Applicable).  If you need a refill on your cardiac medications before your next appointment, please call your pharmacy.   ICD decision pamphlet given

## 2020-09-16 ENCOUNTER — Telehealth: Payer: Self-pay | Admitting: Cardiology

## 2020-09-16 DIAGNOSIS — I255 Ischemic cardiomyopathy: Secondary | ICD-10-CM

## 2020-09-16 NOTE — Telephone Encounter (Signed)
Patient stated that he was calling back to set up appt for procedure but didn't see it in active request. Patient stated that July 6 would be the best for him. Please advise

## 2020-09-16 NOTE — Telephone Encounter (Signed)
Will route to Dr.Taylor's office- discussion for ICD  Thanks!

## 2020-09-22 NOTE — Telephone Encounter (Signed)
Pt scheduled for ICD on October 26, 2020   Will get labs and meet with Pt June 29  Work up complete

## 2020-10-10 ENCOUNTER — Ambulatory Visit (HOSPITAL_COMMUNITY): Payer: Medicaid Other

## 2020-10-10 ENCOUNTER — Ambulatory Visit: Payer: Medicaid Other | Admitting: Surgery

## 2020-10-10 ENCOUNTER — Ambulatory Visit (HOSPITAL_COMMUNITY): Admission: RE | Admit: 2020-10-10 | Payer: Medicaid Other | Source: Ambulatory Visit

## 2020-10-19 ENCOUNTER — Other Ambulatory Visit: Payer: Self-pay

## 2020-10-19 ENCOUNTER — Other Ambulatory Visit: Payer: Medicaid Other | Admitting: *Deleted

## 2020-10-19 DIAGNOSIS — I255 Ischemic cardiomyopathy: Secondary | ICD-10-CM

## 2020-10-19 LAB — CBC WITH DIFFERENTIAL/PLATELET
Basophils Absolute: 0.1 10*3/uL (ref 0.0–0.2)
Basos: 1 %
EOS (ABSOLUTE): 0.2 10*3/uL (ref 0.0–0.4)
Eos: 2 %
Hematocrit: 47.6 % (ref 37.5–51.0)
Hemoglobin: 15.6 g/dL (ref 13.0–17.7)
Lymphocytes Absolute: 1.7 10*3/uL (ref 0.7–3.1)
Lymphs: 22 %
MCH: 30.2 pg (ref 26.6–33.0)
MCHC: 32.8 g/dL (ref 31.5–35.7)
MCV: 92 fL (ref 79–97)
Monocytes Absolute: 0.9 10*3/uL (ref 0.1–0.9)
Monocytes: 12 %
Neutrophils Absolute: 5.1 10*3/uL (ref 1.4–7.0)
Neutrophils: 63 %
Platelets: 242 10*3/uL (ref 150–450)
RBC: 5.16 x10E6/uL (ref 4.14–5.80)
RDW: 17.1 % — ABNORMAL HIGH (ref 11.6–15.4)
WBC: 8 10*3/uL (ref 3.4–10.8)

## 2020-10-19 LAB — BASIC METABOLIC PANEL
BUN/Creatinine Ratio: 21 — ABNORMAL HIGH (ref 9–20)
BUN: 28 mg/dL — ABNORMAL HIGH (ref 6–24)
CO2: 28 mmol/L (ref 20–29)
Calcium: 8.6 mg/dL — ABNORMAL LOW (ref 8.7–10.2)
Chloride: 100 mmol/L (ref 96–106)
Creatinine, Ser: 1.36 mg/dL — ABNORMAL HIGH (ref 0.76–1.27)
Glucose: 302 mg/dL — ABNORMAL HIGH (ref 65–99)
Potassium: 4.8 mmol/L (ref 3.5–5.2)
Sodium: 137 mmol/L (ref 134–144)
eGFR: 60 mL/min/{1.73_m2} (ref >59)

## 2020-10-25 NOTE — Pre-Procedure Instructions (Signed)
Instructed patient on the following items: Arrival time 0830 Nothing to eat or drink after midnight No meds AM of procedure Responsible person to drive you home and stay with you for 24 hrs Wash with special soap night before and morning of procedure If on anti-coagulant drug instructions Eliquis-last dose 7/3. Ok to take [employer].

## 2020-10-26 ENCOUNTER — Encounter (HOSPITAL_COMMUNITY)
Admission: RE | Disposition: A | Discharge status: Home / Self Care | Payer: Medicaid Other | Source: Home / Self Care | Attending: Internal Medicine

## 2020-10-26 ENCOUNTER — Ambulatory Visit (HOSPITAL_COMMUNITY)
Admission: RE | Admit: 2020-10-26 | Discharge: 2020-10-26 | Disposition: A | Discharge status: Home / Self Care | Payer: Medicaid Other | Attending: Internal Medicine | Admitting: Internal Medicine

## 2020-10-26 ENCOUNTER — Ambulatory Visit (HOSPITAL_COMMUNITY): Payer: Medicaid Other

## 2020-10-26 ENCOUNTER — Other Ambulatory Visit: Payer: Self-pay

## 2020-10-26 DIAGNOSIS — I110 Hypertensive heart disease with heart failure: Secondary | ICD-10-CM | POA: Insufficient documentation

## 2020-10-26 DIAGNOSIS — I5022 Chronic systolic (congestive) heart failure: Secondary | ICD-10-CM | POA: Diagnosis not present

## 2020-10-26 DIAGNOSIS — Z888 Allergy status to other drugs, medicaments and biological substances status: Secondary | ICD-10-CM | POA: Diagnosis not present

## 2020-10-26 DIAGNOSIS — Z7984 Long term (current) use of oral hypoglycemic drugs: Secondary | ICD-10-CM | POA: Diagnosis not present

## 2020-10-26 DIAGNOSIS — Z794 Long term (current) use of insulin: Secondary | ICD-10-CM | POA: Insufficient documentation

## 2020-10-26 DIAGNOSIS — I252 Old myocardial infarction: Secondary | ICD-10-CM | POA: Diagnosis not present

## 2020-10-26 DIAGNOSIS — I255 Ischemic cardiomyopathy: Secondary | ICD-10-CM | POA: Diagnosis not present

## 2020-10-26 DIAGNOSIS — Z951 Presence of aortocoronary bypass graft: Secondary | ICD-10-CM | POA: Diagnosis not present

## 2020-10-26 DIAGNOSIS — E785 Hyperlipidemia, unspecified: Secondary | ICD-10-CM | POA: Insufficient documentation

## 2020-10-26 DIAGNOSIS — Z955 Presence of coronary angioplasty implant and graft: Secondary | ICD-10-CM | POA: Insufficient documentation

## 2020-10-26 DIAGNOSIS — I480 Paroxysmal atrial fibrillation: Secondary | ICD-10-CM | POA: Diagnosis not present

## 2020-10-26 DIAGNOSIS — Z885 Allergy status to narcotic agent status: Secondary | ICD-10-CM | POA: Insufficient documentation

## 2020-10-26 DIAGNOSIS — Z95810 Presence of automatic (implantable) cardiac defibrillator: Secondary | ICD-10-CM

## 2020-10-26 DIAGNOSIS — I2510 Atherosclerotic heart disease of native coronary artery without angina pectoris: Secondary | ICD-10-CM | POA: Insufficient documentation

## 2020-10-26 DIAGNOSIS — Z87891 Personal history of nicotine dependence: Secondary | ICD-10-CM | POA: Insufficient documentation

## 2020-10-26 DIAGNOSIS — Z7901 Long term (current) use of anticoagulants: Secondary | ICD-10-CM | POA: Insufficient documentation

## 2020-10-26 DIAGNOSIS — Z79899 Other long term (current) drug therapy: Secondary | ICD-10-CM | POA: Insufficient documentation

## 2020-10-26 HISTORY — PX: ICD IMPLANT: EP1208

## 2020-10-26 LAB — GLUCOSE, CAPILLARY
Glucose-Capillary: 364 mg/dL — ABNORMAL HIGH (ref 70–99)
Glucose-Capillary: 232 mg/dL — ABNORMAL HIGH (ref 70–99)

## 2020-10-26 SURGERY — ICD IMPLANT

## 2020-10-26 MED ORDER — INSULIN ASPART 100 UNIT/ML IJ SOLN
8.0000 [IU] | Freq: Once | INTRAMUSCULAR | Status: AC
  Administered 2020-10-26: 8 [IU] via SUBCUTANEOUS
  Filled 2020-10-26: qty 0.08

## 2020-10-26 MED ORDER — INSULIN ASPART 100 UNIT/ML IJ SOLN
INTRAMUSCULAR | Status: AC
  Filled 2020-10-26: qty 1

## 2020-10-26 MED ORDER — HEPARIN (PORCINE) IN NACL 1000-0.9 UT/500ML-% IV SOLN
INTRAVENOUS | Status: DC | PRN
  Administered 2020-10-26: 500 mL

## 2020-10-26 MED ORDER — CEFAZOLIN SODIUM-DEXTROSE 2-4 GM/100ML-% IV SOLN
2.0000 g | Freq: Once | INTRAVENOUS | Status: DC
  Filled 2020-10-26: qty 100

## 2020-10-26 MED ORDER — CEFAZOLIN SODIUM-DEXTROSE 2-4 GM/100ML-% IV SOLN
2.0000 g | INTRAVENOUS | Status: AC
  Administered 2020-10-26: 2 g via INTRAVENOUS
  Filled 2020-10-26: qty 100

## 2020-10-26 MED ORDER — MIDAZOLAM HCL 5 MG/5ML IJ SOLN
INTRAMUSCULAR | Status: AC
  Filled 2020-10-26: qty 5

## 2020-10-26 MED ORDER — LIDOCAINE HCL (PF) 1 % IJ SOLN
INTRAMUSCULAR | Status: AC
  Filled 2020-10-26: qty 60

## 2020-10-26 MED ORDER — CEFAZOLIN SODIUM-DEXTROSE 1-4 GM/50ML-% IV SOLN
1.0000 g | Freq: Once | INTRAVENOUS | Status: AC
  Administered 2020-10-26: 1 g via INTRAVENOUS
  Filled 2020-10-26 (×2): qty 50

## 2020-10-26 MED ORDER — MIDAZOLAM HCL 5 MG/5ML IJ SOLN
INTRAMUSCULAR | Status: DC | PRN
  Administered 2020-10-26 (×5): 1 mg via INTRAVENOUS

## 2020-10-26 MED ORDER — SODIUM CHLORIDE 0.9 % IV SOLN
INTRAVENOUS | Status: AC
  Filled 2020-10-26: qty 2

## 2020-10-26 MED ORDER — LIDOCAINE HCL (PF) 1 % IJ SOLN
INTRAMUSCULAR | Status: DC | PRN
  Administered 2020-10-26: 60 mL

## 2020-10-26 MED ORDER — POVIDONE-IODINE 10 % EX SWAB
2.0000 "application " | Freq: Once | CUTANEOUS | Status: AC
  Administered 2020-10-26: 2 via TOPICAL

## 2020-10-26 MED ORDER — SODIUM CHLORIDE 0.9 % IV SOLN
80.0000 mg | INTRAVENOUS | Status: AC
  Administered 2020-10-26: 80 mg
  Filled 2020-10-26: qty 2

## 2020-10-26 MED ORDER — FENTANYL CITRATE (PF) 100 MCG/2ML IJ SOLN
INTRAMUSCULAR | Status: DC | PRN
  Administered 2020-10-26 (×5): 12.5 ug via INTRAVENOUS

## 2020-10-26 MED ORDER — CHLORHEXIDINE GLUCONATE 4 % EX LIQD
4.0000 "application " | Freq: Once | CUTANEOUS | Status: DC
  Filled 2020-10-26: qty 60

## 2020-10-26 MED ORDER — CEFAZOLIN SODIUM-DEXTROSE 2-4 GM/100ML-% IV SOLN
INTRAVENOUS | Status: AC
  Filled 2020-10-26: qty 100

## 2020-10-26 MED ORDER — SODIUM CHLORIDE 0.9 % IV SOLN: INTRAVENOUS | Status: DC

## 2020-10-26 MED ORDER — FENTANYL CITRATE (PF) 100 MCG/2ML IJ SOLN
INTRAMUSCULAR | Status: AC
  Filled 2020-10-26: qty 2

## 2020-10-26 MED ORDER — ACETAMINOPHEN 325 MG PO TABS
325.0000 mg | ORAL_TABLET | ORAL | Status: DC | PRN
  Administered 2020-10-26: 650 mg via ORAL
  Filled 2020-10-26 (×3): qty 2

## 2020-10-26 MED ORDER — HEPARIN (PORCINE) IN NACL 1000-0.9 UT/500ML-% IV SOLN
INTRAVENOUS | Status: AC
  Filled 2020-10-26: qty 500

## 2020-10-26 SURGICAL SUPPLY — 6 items
CABLE SURGICAL S-101-97-12 (CABLE) ×2 IMPLANT
ICD ACTICOR DX (ICD Generator) ×1 IMPLANT
LEAD PLEXA 65/15 (Lead) ×1 IMPLANT
PAD PRO RADIOLUCENT 2001M-C (PAD) ×2 IMPLANT
SHEATH 8FR PRELUDE SNAP 13 (SHEATH) ×1 IMPLANT
TRAY PACEMAKER INSERTION (PACKS) ×2 IMPLANT

## 2020-10-26 NOTE — H&P (Signed)
HPI Mr. Woodford is referred by Dr. Stanford Breed for consideration for ICD implantation for primary prevention of malignant ventricular arrhythmias. He has never had syncope and is s/p CABG complicated by wound healing problems. He has not had recent anginal symptoms. The patient underwent CABG in 7/21. He previously undewent coronary stenting in 2005 and 2007. He was hospitalized with CHF in February. He has done well since then. He denies edema.       Allergies  Allergen Reactions   Codeine Shortness Of Breath and Itching   Hydrocodone Shortness Of Breath, Nausea Only and Rash      Breathing problems ALSO   Gabapentin Other (See Comments)      Suicidal thoughts, extreme dreams   Pregabalin Other (See Comments)      Suicidal thoughts, extreme dreams   Simvastatin Other (See Comments)      Pain in joints   Lisinopril Itching and Rash   Venlafaxine Rash              Current Outpatient Medications  Medication Sig Dispense Refill   amLODipine (NORVASC) 5 MG tablet Take 1 tablet (5 mg total) by mouth daily. 180 tablet 3   carvedilol (COREG) 25 MG tablet Take 1 tablet (25 mg total) by mouth 2 (two) times daily. 180 tablet 3   Dulaglutide (TRULICITY [place]) Inject into the skin. Pt not sure of the dose       empagliflozin (JARDIANCE) 25 MG TABS tablet Take 1 tablet (25 mg total) by mouth daily before breakfast. 30 tablet     empagliflozin (JARDIANCE) 25 MG TABS tablet TAKE 1 TABLET (25 MG TOTAL) BY MOUTH DAILY. 30 tablet 0   Multiple Vitamins-Minerals (CENTRUM SILVER) tablet Take 1 tablet by mouth 2 (two) times a week.       OXYGEN Inhale 2-3 L/min into the lungs at bedtime as needed (for shortness of breath).       sacubitril-valsartan (ENTRESTO) 97-103 MG Take 1 tablet by mouth 2 (two) times daily. 180 tablet 3   spironolactone (ALDACTONE) 25 MG tablet Take 1 tablet (25 mg total) by mouth daily. 90 tablet 3   TRESIBA FLEXTOUCH 100 UNIT/ML FlexTouch Pen Inject 30 Units into the skin.        apixaban (ELIQUIS) 5 MG TABS tablet Take 1 tablet (5 mg total) by mouth 2 (two) times daily. 180 tablet 3   atorvastatin (LIPITOR) 40 MG tablet Take 1 tablet (40 mg total) by mouth daily. 90 tablet 3   furosemide (LASIX) 40 MG tablet Take 1 tablet (40 mg total) by mouth daily. TAKE 80MG  X3D THEN BACK TO 40MG  93 tablet 3    No current facility-administered medications for this visit.            Past Medical History:  Diagnosis Date   Anxiety     CAD (coronary artery disease)     Chronic back pain     Colon polyps     Diabetes mellitus     Heart attack ([place])     Hyperlipidemia     Hypertension     Panic attacks     PVD (peripheral vascular disease) ([place])        ROS:    All systems reviewed and negative except as noted in the HPI.          Past Surgical History:  Procedure Laterality Date   APPLICATION OF A-CELL OF BACK N/A 12/09/2019    Procedure:  APPLICATION OF A-CELL OF BACK;  Surgeon: Wonda Olds, MD;  [facility];  Service: Thoracic;  Laterality: N/A;   APPLICATION OF A-CELL OF CHEST/ABDOMEN N/A 12/04/2019    Procedure: APPLICATION OF A-CELL OF CHEST;  Surgeon: Wonda Olds, MD;  [facility];  Service: Thoracic;  Laterality: N/A;   APPLICATION OF WOUND VAC N/A 11/27/2019    Procedure: APPLICATION OF WOUND VAC;  Surgeon: Ivin Poot, MD;  Location: [place];  Service: Cardiovascular;  Laterality: N/A;  Lower midline superficial sternal wound area.   APPLICATION OF WOUND VAC N/A 12/09/2019    Procedure: WOUND VAC EXCHANGE;  Surgeon: Wonda Olds, MD;  [facility];  Service: Thoracic;  Laterality: N/A;   APPLICATION OF WOUND VAC N/A 12/14/2019    Procedure: WOUND VAC CHANGE;  Surgeon: Wonda Olds, MD;  [facility];  Service: Thoracic;  Laterality: N/A;   CORONARY ANGIOPLASTY WITH STENT PLACEMENT       CORONARY ARTERY BYPASS GRAFT N/A 10/29/2019    Procedure: CORONARY ARTERY BYPASS GRAFTING (CABG) using LIMA to LAD; RIMA to PDA; and Left  radial harvest vein to Circ.;  Surgeon: Wonda Olds, MD;  Location: [place];  Service: Open Heart Surgery;  Laterality: N/A;   EAR CYST EXCISION   12/10/2011    Procedure: CYST REMOVAL;  Surgeon: Gae Bon, DDS;  Location: [place];  Service: Oral Surgery;  Laterality: Left;  Left Mandible Cyst Removal   IR THORACENTESIS ASP PLEURAL SPACE W/IMG GUIDE   10/27/2019   MULTIPLE EXTRACTIONS WITH ALVEOLOPLASTY   12/10/2011    Procedure: MULTIPLE EXTRACION WITH ALVEOLOPLASTY;  Surgeon: Gae Bon, DDS;  Location: [place];  Service: Oral Surgery;  Laterality: N/A;  Right Maxillary Tuberosity Reduction; Right Maxillary Buccal Exostosis; Bilateral Mandibular Tori   PERIPHERAL VASCULAR CATHETERIZATION N/A 12/28/2014    Procedure: Abdominal Aortogram;  Surgeon: Serafina Mitchell, MD;  Location: [facility];  Service: Cardiovascular;  Laterality: N/A;   PERIPHERAL VASCULAR CATHETERIZATION N/A 12/20/2015    Procedure: Abdominal Aortogram;  Surgeon: Serafina Mitchell, MD;  Location: [facility];  Service: Cardiovascular;  Laterality: N/A;   PERIPHERAL VASCULAR CATHETERIZATION   12/20/2015    Procedure: Peripheral Vascular Balloon Angioplasty;  Surgeon: Serafina Mitchell, MD;  Location: [facility];  Service: Cardiovascular;;   RADIAL ARTERY HARVEST Left 10/29/2019    Procedure: RADIAL ARTERY HARVEST;  Surgeon: Wonda Olds, MD;  Location: [place];  Service: Open Heart Surgery;  Laterality: Left;   RIGHT/LEFT HEART CATH AND CORONARY ANGIOGRAPHY N/A 10/28/2019    Procedure: RIGHT/LEFT HEART CATH AND CORONARY ANGIOGRAPHY;  Surgeon: Burnell Blanks, MD;  Location: [facility];  Service: Cardiovascular;  Laterality: N/A;   STERNAL WOUND DEBRIDEMENT N/A 12/01/2019    Procedure: STERNAL WOUND DEBRIDEMENT and WOUND VAC CHANGE;  Surgeon: Wonda Olds, MD;  Location: [place];  Service: Thoracic;  Laterality: N/A;   STERNAL WOUND DEBRIDEMENT N/A 12/04/2019    Procedure: STERNAL WOUND  DEBRIDEMENT and WOUND VAC CHANGE;  Surgeon: Wonda Olds, MD;  Location: [place];  Service: Thoracic;  Laterality: N/A;   STERNAL WOUND DEBRIDEMENT N/A 12/09/2019    Procedure: STERNAL WOUND DEBRIDEMENT;  Surgeon: Wonda Olds, MD;  [facility];  Service: Thoracic;  Laterality: N/A;   TEE WITHOUT CARDIOVERSION N/A 10/29/2019    Procedure: TRANSESOPHAGEAL ECHOCARDIOGRAM (TEE);  Surgeon: Wonda Olds, MD;  Location: [place];  Service: Open  Heart Surgery;  Laterality: N/A;   WOUND EXPLORATION N/A 11/27/2019    Procedure: EXCISIONAL DEBRIDEMENT OF SUPERFICIAL STERNAL WOUND EXPLORATION;  Surgeon: Ivin Poot, MD;  Location: [place];  Service: Cardiovascular;  Laterality: N/A;  Superficial sternal wound             Family History  Problem Relation Age of Onset   Diabetes Father     Kidney failure Father     Hypertension Father     Hyperlipidemia Father     Heart disease Father          Social History         Socioeconomic History   Marital status: Legally Separated      Spouse name: Not on file   Number of children: 3   Years of education: Not on file   Highest education level: Not on file  Occupational History      Comment: Unemployed  Tobacco Use   Smoking status: Former Smoker      Packs/day: 0.25      Years: 25.00      Pack years: 6.25      Types: Cigarettes      Start date: 1978      Quit date: 10/23/2019      Years since quitting: 0.8   Smokeless tobacco: Never Used   Tobacco comment: 1/4 pack per day  Vaping Use   Vaping Use: Never used  Substance and Sexual Activity   Alcohol use: No      Alcohol/week: 0.0 standard drinks   Drug use: No   Sexual activity: Not on file  Other Topics Concern   Not on file  Social History Narrative   Not on file    Social Determinants of Health       Financial Resource Strain: Low Risk   Difficulty of Paying Living Expenses: Not very hard  Food Insecurity: No Food Insecurity   Worried About Charity fundraiser in  the Last Year: Never true   Ran Out of Food in the Last Year: Never true  Transportation Needs: No Transportation Needs   Lack of Transportation (Medical): No   Lack of Transportation (Non-Medical): No  Physical Activity: Insufficiently Active   Days of Exercise per Week: 1 day   Minutes of Exercise per Session: 30 min  Stress: Not on file  Social Connections: Socially Isolated   Frequency of Communication with Friends and Family: More than three times a week   Frequency of Social Gatherings with Friends and Family: More than three times a week   Attends Religious Services: Never   Marine scientist or Organizations: No   Attends Music therapist: Never   Marital Status: Divorced  Human resources officer Violence: Not on file        BP 98/62   Pulse (!) 54   Ht 5\' 7"  (1.702 m)   Wt 215 lb 6.4 oz (97.7 kg)   SpO2 95%   BMI 33.74 kg/m    Physical Exam:   Well appearing middle aged man, NAD HEENT: Unremarkable Neck:  No JVD, no thyromegally Lymphatics:  No adenopathy Back:  No CVA tenderness Lungs:  Clear with no wheezes HEART:  Regular rate rhythm, no murmurs, no rubs, no clicks Abd:  soft, positive bowel sounds, no organomegally, no rebound, no guarding Ext:  2 plus pulses, no edema, no cyanosis, no clubbing Skin:  No rashes no nodules Neuro:  CN II through XII intact, motor grossly  intact   EKG - sinus bradycardia with anterior MI     Assess/Plan: 1. Chronic systolic heart failure - his symptoms are class 2 on maximal medical therapy. 2. ICM - he denies anginal symptoms. 3. PAF - He appears to be maintaining NSR. He will continue eliquis. 4. Dyslipidemia - he will continue his statin.   Carleene Overlie Leroy Trim,MD

## 2020-10-26 NOTE — Discharge Instructions (Signed)
    Supplemental Discharge Instructions for  Pacemaker/Defibrillator Patients  Tomorrow, 10/27/20, send in a device transmission  Activity No heavy lifting or vigorous activity with your left/right arm for 6 to 8 weeks.  Do not raise your left/right arm above your head for one week.  Gradually raise your affected arm as drawn below.             10/31/20                     11/01/20                    11/02/20                   11/03/20 __  NO DRIVING for   1 week  ; you may begin driving on   0/56/97  .  WOUND CARE Keep the wound area clean and dry.  Do not get this area wet , no showers for one week; you may shower on  11/03/20   . Tomorrow, 10/27/20, remove the arm sling Tomorrow, 10/27/20 remove the LARGE outer plastic bandage.  Underneath the plastic bandage there are steri strips (paper tapes), DO NOT remove these. The tape/steri-strips on your wound will fall off; do not pull them off.  No bandage is needed on the site.  DO  NOT apply any creams, oils, or ointments to the wound area. If you notice any drainage or discharge from the wound, any swelling or bruising at the site, or you develop a fever > 101? F after you are discharged home, call the office at once.  Special Instructions You are still able to use cellular telephones; use the ear opposite the side where you have your pacemaker/defibrillator.  Avoid carrying your cellular phone near your device. When traveling through airports, show security personnel your identification card to avoid being screened in the metal detectors.  Ask the security personnel to use the hand wand. Avoid arc welding equipment, MRI testing (magnetic resonance imaging), TENS units (transcutaneous nerve stimulators).  Call the office for questions about other devices. Avoid electrical appliances that are in poor condition or are not properly grounded. Microwave ovens are safe to be near or to operate.  Additional information for defibrillator patients should  your device go off: If your device goes off ONCE and you feel fine afterward, notify the device clinic nurses. If your device goes off ONCE and you do not feel well afterward, call 911. If your device goes off TWICE, call 911. If your device goes off THREE times in one day, call 911.  DO NOT DRIVE YOURSELF OR A FAMILY MEMBER WITH A DEFIBRILLATOR TO THE HOSPITAL--CALL 911.

## 2020-10-26 NOTE — H&P (Signed)
I have seen [name] is a 60 y.o. male in the office today who has been referred by Dr. Stanford Breed for consideration of ICD implant for priamry prevention of sudden death.  The patient's chart has been reviewed and they meet criteria for ICD implant.  I have had a thorough discussion with the patient reviewing options.  The patient and their family (if available) have had opportunities to ask questions and have them answered. The patient and I have decided together through a shared decision making process to proceed with ICD at this time.  Risks, benefits, alternatives to ICD implantation were discussed in detail with the patient today. The patient  understands that the risks include but are not limited to bleeding, infection, pneumothorax, perforation, tamponade, vascular damage, renal failure, MI, stroke, death, inappropriate shocks, and lead dislodgement and he wishes to proceed.  We will therefore schedule device implantation at the next available time.

## 2020-10-27 ENCOUNTER — Telehealth: Payer: Self-pay

## 2020-10-27 ENCOUNTER — Encounter (HOSPITAL_COMMUNITY): Payer: Self-pay | Admitting: Internal Medicine

## 2020-10-27 NOTE — Telephone Encounter (Signed)
Follow-up after same day discharge: Implant date: 10/26/20 MD: Cristopher Peru, MD Device: Biotronik single chamber ICD Location: Left Pectoral   Wound check visit: 11/09/20 90 day MD follow-up: 02/07/21  Remote Transmission received:Yes  Dressing removed: Yes reviewed s/s infection to monitor for and report.  Activity limitations reviewed.

## 2020-10-31 NOTE — Progress Notes (Signed)
HPI: Follow-up coronary artery disease and congestive heart failure.  Patient admitted with congestive heart failure July 2021.  Cardiac catheterization showed severe three-vessel coronary artery disease.  Carotid Dopplers July 2021 showed 80 to 99% right carotid and 40 to 59% left carotid stenosis.  ABIs showed moderate reduction on the right and mild on the left.  He had coronary artery bypass graft with a LIMA to the LAD and RIMA to the PDA July 2021.  Postoperative course complicated by atrial fibrillation.  Also with significant volume overload requiring diuresis and right thoracentesis.  Most recent echocardiogram 2/22 showed ejection fraction 25 to 30%, mild left ventricular hypertrophy, restrictive filling, moderate RV dysfunction, severe left atrial enlargement, mild tricuspid regurgitation.  CTA February 2022 showed bilateral pleural effusions, mild interstitial edema.  Patient had ICD implanted June 2022.  Since last seen patient denies dyspnea, chest pain, palpitations or syncope.  No pedal edema.  Current Outpatient Medications  Medication Sig Dispense Refill   amLODipine (NORVASC) 5 MG tablet Take 1 tablet (5 mg total) by mouth daily. 180 tablet 3   apixaban (ELIQUIS) 5 MG TABS tablet Take 1 tablet (5 mg total) by mouth 2 (two) times daily. 180 tablet 3   atorvastatin (LIPITOR) 40 MG tablet Take 1 tablet (40 mg total) by mouth daily. 90 tablet 3   carvedilol (COREG) 25 MG tablet Take 1 tablet (25 mg total) by mouth 2 (two) times daily. 180 tablet 3   Dulaglutide 1.5 MG/0.5ML SOPN Inject 1.5 mg into the skin once a week.     empagliflozin (JARDIANCE) 25 MG TABS tablet Take 1 tablet (25 mg total) by mouth daily before breakfast. 30 tablet    furosemide (LASIX) 40 MG tablet Take 1 tablet (40 mg total) by mouth daily. TAKE 80MG  X3D THEN BACK TO 40MG  93 tablet 3   metFORMIN (GLUCOPHAGE) 1000 MG tablet Take 1,000 mg by mouth 2 (two) times daily.     Multiple Vitamins-Minerals (CENTRUM  SILVER) tablet Take 1 tablet by mouth once a week.     OXYGEN Inhale 3 L/min into the lungs at bedtime as needed (for shortness of breath).     sacubitril-valsartan (ENTRESTO) 97-103 MG Take 1 tablet by mouth 2 (two) times daily. 180 tablet 3   spironolactone (ALDACTONE) 25 MG tablet Take 1 tablet (25 mg total) by mouth daily. 90 tablet 3   TRESIBA FLEXTOUCH 100 UNIT/ML FlexTouch Pen Inject 30 Units into the skin daily.     triamcinolone (KENALOG) 0.025 % cream Apply 1 application topically daily as needed (Breakout).     traMADol (ULTRAM) 50 MG tablet Take 50 mg by mouth every 6 (six) hours as needed for pain. (Patient not taking: Reported on 11/04/2020)     valACYclovir (VALTREX) 1000 MG tablet Take 1,000 mg by mouth 3 (three) times daily. (Patient not taking: Reported on 11/04/2020)     No current facility-administered medications for this visit.     Past Medical History:  Diagnosis Date   Anxiety    CAD (coronary artery disease)    Chronic back pain    Colon polyps    Diabetes mellitus    Heart attack ([place])    Hyperlipidemia    Hypertension    Panic attacks    PVD (peripheral vascular disease) ([place])     Past Surgical History:  Procedure Laterality Date   APPLICATION OF A-CELL OF BACK N/A 12/09/2019   Procedure: APPLICATION OF A-CELL OF BACK;  Surgeon: Fredrich Romans  Z, MD;  Location: [place] [facility];  Service: Thoracic;  Laterality: N/A;   APPLICATION OF A-CELL OF CHEST/ABDOMEN N/A 12/04/2019   Procedure: APPLICATION OF A-CELL OF CHEST;  Surgeon: Wonda Olds, MD;  [facility];  Service: Thoracic;  Laterality: N/A;   APPLICATION OF WOUND VAC N/A 11/27/2019   Procedure: APPLICATION OF WOUND VAC;  Surgeon: Ivin Poot, MD;  Location: [place];  Service: Cardiovascular;  Laterality: N/A;  Lower midline superficial sternal wound area.   APPLICATION OF WOUND VAC N/A 12/09/2019   Procedure: WOUND VAC EXCHANGE;  Surgeon: Wonda Olds, MD;  [facility];  Service: Thoracic;   Laterality: N/A;   APPLICATION OF WOUND VAC N/A 12/14/2019   Procedure: WOUND VAC CHANGE;  Surgeon: Wonda Olds, MD;  [facility];  Service: Thoracic;  Laterality: N/A;   CORONARY ANGIOPLASTY WITH STENT PLACEMENT     CORONARY ARTERY BYPASS GRAFT N/A 10/29/2019   Procedure: CORONARY ARTERY BYPASS GRAFTING (CABG) using LIMA to LAD; RIMA to PDA; and Left radial harvest vein to Circ.;  Surgeon: Wonda Olds, MD;  Location: [place];  Service: Open Heart Surgery;  Laterality: N/A;   EAR CYST EXCISION  12/10/2011   Procedure: CYST REMOVAL;  Surgeon: Gae Bon, DDS;  Location: [facility];  Service: Oral Surgery;  Laterality: Left;  Left Mandible Cyst Removal   ICD IMPLANT N/A 10/26/2020   Procedure: ICD IMPLANT;  Surgeon: Evans Lance, MD;  Location: [facility];  Service: Cardiovascular;  Laterality: N/A;   IR THORACENTESIS ASP PLEURAL SPACE W/IMG GUIDE  10/27/2019   MULTIPLE EXTRACTIONS WITH ALVEOLOPLASTY  12/10/2011   Procedure: MULTIPLE EXTRACION WITH ALVEOLOPLASTY;  Surgeon: Gae Bon, DDS;  Location: [place];  Service: Oral Surgery;  Laterality: N/A;  Right Maxillary Tuberosity Reduction; Right  Maxillary Buccal Exostosis; Bilateral Mandibular Tori    PERIPHERAL VASCULAR CATHETERIZATION N/A 12/28/2014   Procedure: Abdominal Aortogram;  Surgeon: Serafina Mitchell, MD;  Location: [facility];  Service: Cardiovascular;  Laterality: N/A;   PERIPHERAL VASCULAR CATHETERIZATION N/A 12/20/2015   Procedure: Abdominal Aortogram;  Surgeon: Serafina Mitchell, MD;  Location: [facility];  Service: Cardiovascular;  Laterality: N/A;   PERIPHERAL VASCULAR CATHETERIZATION  12/20/2015   Procedure: Peripheral Vascular Balloon Angioplasty;  Surgeon: Serafina Mitchell, MD;  Location: [place] [facility];  Service: Cardiovascular;;   RADIAL ARTERY HARVEST Left 10/29/2019   Procedure: RADIAL ARTERY HARVEST;  Surgeon: Wonda Olds, MD;  Location: [place];  Service: Open Heart Surgery;  Laterality:  Left;   RIGHT/LEFT HEART CATH AND CORONARY ANGIOGRAPHY N/A 10/28/2019   Procedure: RIGHT/LEFT HEART CATH AND CORONARY ANGIOGRAPHY;  Surgeon: Burnell Blanks, MD;  Location: [facility];  Service: Cardiovascular;  Laterality: N/A;   STERNAL WOUND DEBRIDEMENT N/A 12/01/2019   Procedure: STERNAL WOUND DEBRIDEMENT and WOUND VAC CHANGE;  Surgeon: Wonda Olds, MD;  Location: [place];  Service: Thoracic;  Laterality: N/A;   STERNAL WOUND DEBRIDEMENT N/A 12/04/2019   Procedure: STERNAL WOUND DEBRIDEMENT and WOUND VAC CHANGE;  Surgeon: Wonda Olds, MD;  Location: [place];  Service: Thoracic;  Laterality: N/A;   STERNAL WOUND DEBRIDEMENT N/A 12/09/2019   Procedure: STERNAL WOUND DEBRIDEMENT;  Surgeon: Wonda Olds, MD;  [facility];  Service: Thoracic;  Laterality: N/A;   TEE WITHOUT CARDIOVERSION N/A 10/29/2019   Procedure: TRANSESOPHAGEAL ECHOCARDIOGRAM (TEE);  Surgeon: Wonda Olds, MD;  Location: [place];  Service: Open  Heart Surgery;  Laterality: N/A;   WOUND EXPLORATION N/A 11/27/2019   Procedure: EXCISIONAL DEBRIDEMENT OF SUPERFICIAL STERNAL WOUND EXPLORATION;  Surgeon: Ivin Poot, MD;  Location: [place];  Service: Cardiovascular;  Laterality: N/A;  Superficial sternal wound    Social History   Socioeconomic History   Marital status: Legally Separated    Spouse name: Not on file   Number of children: 3   Years of education: Not on file   Highest education level: Not on file  Occupational History    Comment: Unemployed  Tobacco Use   Smoking status: Former    Packs/day: 0.25    Years: 25.00    Pack years: 6.25    Types: Cigarettes    Start date: 57    Quit date: 10/23/2019    Years since quitting: 1.0   Smokeless tobacco: Never   Tobacco comments:    1/4 pack per day  Vaping Use   Vaping Use: Never used  Substance and Sexual Activity   Alcohol use: No    Alcohol/week: 0.0 standard drinks   Drug use: No   Sexual activity: Not on file  Other Topics  Concern   Not on file  Social History Narrative   Not on file   Social Determinants of Health   Financial Resource Strain: Low Risk    Difficulty of Paying Living Expenses: Not very hard  Food Insecurity: No Food Insecurity   Worried About Charity fundraiser in the Last Year: Never true   Ran Out of Food in the Last Year: Never true  Transportation Needs: No Transportation Needs   Lack of Transportation (Medical): No   Lack of Transportation (Non-Medical): No  Physical Activity: Not on file  Stress: Not on file  Social Connections: Not on file  Intimate Partner Violence: Not on file    Family History  Problem Relation Age of Onset   Diabetes Father    Kidney failure Father    Hypertension Father    Hyperlipidemia Father    Heart disease Father     ROS: no fevers or chills, productive cough, hemoptysis, dysphasia, odynophagia, melena, hematochezia, dysuria, hematuria, rash, seizure activity, orthopnea, PND, pedal edema, claudication. Remaining systems are negative.  Physical Exam: Well-developed well-nourished in no acute distress.  Skin is warm and dry.  HEENT is normal.  Neck is supple.  Chest is clear to auscultation with normal expansion.  ICD left chest Cardiovascular exam is regular rate and rhythm.  Abdominal exam nontender or distended. No masses palpated. Extremities show no edema. neuro grossly intact  A/P  1 coronary artery disease status post coronary artery bypass graft-plan to continue aspirin and statin.  No chest pain.  2 ischemic cardiomyopathy-continue Entresto and carvedilol.  3 status post ICD-follow-up with Dr. Lovena Le.  4 chronic systolic congestive heart failure-he is euvolemic today on examination.  Continue Lasix and spironolactone.    5 paroxysmal atrial fibrillation-continue apixaban.  Patient remains in sinus rhythm on examination.  6 hypertension-blood pressure is borderline.  Discontinue amlodipine and follow.  7 carotid artery  disease-patient will need follow-up with vascular surgery.  8 history of peripheral vascular disease-continue medical therapy.  Patient denies claudication.  9 hyperlipidemia-patient did not tolerate higher doses of Lipitor previously.  We will follow lipids in the future and add additional medications if his LDL is not at goal.  Kirk Ruths, MD

## 2020-11-04 ENCOUNTER — Ambulatory Visit (INDEPENDENT_AMBULATORY_CARE_PROVIDER_SITE_OTHER): Payer: Medicaid Other | Admitting: Cardiology

## 2020-11-04 ENCOUNTER — Other Ambulatory Visit: Payer: Self-pay

## 2020-11-04 ENCOUNTER — Encounter: Payer: Self-pay | Admitting: Cardiology

## 2020-11-04 VITALS — BP 102/74 | HR 59 | Ht 67.0 in | Wt 216.6 lb

## 2020-11-04 DIAGNOSIS — Z951 Presence of aortocoronary bypass graft: Secondary | ICD-10-CM

## 2020-11-04 DIAGNOSIS — I255 Ischemic cardiomyopathy: Secondary | ICD-10-CM

## 2020-11-04 DIAGNOSIS — I10 Essential (primary) hypertension: Secondary | ICD-10-CM

## 2020-11-04 DIAGNOSIS — Z95810 Presence of automatic (implantable) cardiac defibrillator: Secondary | ICD-10-CM

## 2020-11-04 DIAGNOSIS — I6523 Occlusion and stenosis of bilateral carotid arteries: Secondary | ICD-10-CM | POA: Diagnosis not present

## 2020-11-04 DIAGNOSIS — I480 Paroxysmal atrial fibrillation: Secondary | ICD-10-CM

## 2020-11-04 NOTE — Patient Instructions (Signed)
Medication Instructions:   STOP AMLODIPINE  *If you need a refill on your cardiac medications before your next appointment, please call your pharmacy*   Follow-Up: At [facility], you and your health needs are our priority.  As part of our continuing mission to provide you with exceptional heart care, we have created designated Provider Care Teams.  These Care Teams include your primary Cardiologist (physician) and Advanced Practice Providers (APPs -  Physician Assistants and Nurse Practitioners) who all work together to provide you with the care you need, when you need it.  We recommend signing up for the patient portal called "MyChart".  Sign up information is provided on this After Visit Summary.  MyChart is used to connect with patients for Virtual Visits (Telemedicine).  Patients are able to view lab/test results, encounter notes, upcoming appointments, etc.  Non-urgent messages can be sent to your provider as well.   To learn more about what you can do with MyChart, go to [email or web address].    Your next appointment:   6 month(s)  The format for your next appointment:   In Person  Provider:   Kirk Ruths, MD

## 2020-11-09 ENCOUNTER — Ambulatory Visit (INDEPENDENT_AMBULATORY_CARE_PROVIDER_SITE_OTHER): Payer: Medicaid Other

## 2020-11-09 ENCOUNTER — Other Ambulatory Visit: Payer: Self-pay

## 2020-11-09 DIAGNOSIS — I255 Ischemic cardiomyopathy: Secondary | ICD-10-CM | POA: Diagnosis not present

## 2020-11-09 LAB — CUP PACEART INCLINIC DEVICE CHECK
Battery Voltage: 3.12 V
Brady Statistic RV Percent Paced: 0 %
Date Time Interrogation Session: 20220720142029
HighPow Impedance: 73 Ohm
Implantable Lead Implant Date: 20220706
Implantable Lead Location: 753860
Implantable Lead Model: 436909
Implantable Lead Serial Number: 81441117
Implantable Pulse Generator Implant Date: 20220706
Lead Channel Impedance Value: 853 Ohm
Lead Channel Pacing Threshold Amplitude: 0.4 V
Lead Channel Pacing Threshold Pulse Width: 0.4 ms
Lead Channel Sensing Intrinsic Amplitude: 2.2 mV
Lead Channel Sensing Intrinsic Amplitude: 24.2 mV
Lead Channel Setting Pacing Amplitude: 3 V
Lead Channel Setting Pacing Pulse Width: 0.4 ms
Lead Channel Setting Sensing Sensitivity: 0.8 mV
Pulse Gen Model: 429525
Pulse Gen Serial Number: 84841030

## 2020-11-09 NOTE — Patient Instructions (Signed)
   After Your ICD (Implantable Cardiac Defibrillator)    Monitor your defibrillator site for redness, swelling, and drainage. Call the device clinic at [phone] if you experience these symptoms or fever/chills.  Your incision was closed with Steri-strips or staples:  You may shower 7 days after your procedure and wash your incision with soap and water. Avoid lotions, ointments, or perfumes over your incision until it is well-healed.    You may use a hot tub or a pool after your wound check appointment if the incision is completely closed.  Do not lift, push or pull greater than 10 pounds with the affected arm until 6 weeks after your procedure. There are no other restrictions in arm movement after your wound check appointment.  Your ICD is MRI compatible.  Your ICD is designed to protect you from life threatening heart rhythms. Because of this, you may receive a shock.   1 shock with no symptoms:  Call the office during business hours. 1 shock with symptoms (chest pain, chest pressure, dizziness, lightheadedness, shortness of breath, overall feeling unwell):  Call 911. If you experience 2 or more shocks in 24 hours:  Call 911. If you receive a shock, you should not drive.  [place] DMV - no driving for 6 months if you receive appropriate therapy from your ICD.   ICD Alerts:  Some alerts are vibratory and others beep. These are NOT emergencies. Please call our office to let us know. If this occurs at night or on weekends, it can wait until the next business day. Send a remote transmission.  If your device is capable of reading fluid status (for heart failure), you will be offered monthly monitoring to review this with you.   Remote monitoring is used to monitor your ICD from home. This monitoring is scheduled every 91 days by our office. It allows us to keep an eye on the functioning of your device to ensure it is working properly. You will routinely see your Electrophysiologist annually  (more often if necessary).   

## 2020-11-09 NOTE — Progress Notes (Signed)
Wound check appointment. Steri-strips removed. Wound without redness or edema. Incision edges approximated, wound well healed. Normal device function. Thresholds, sensing, and impedances consistent with implant measurements. Device programmed at 3.0V for extra safety margin until 3 month visit. Histogram distribution appropriate for patient and level of activity. No ventricular arrhythmias noted. Patient educated about wound care, arm mobility, lifting restrictions, shock plan. Patient is enrolled in remote monitoring, transmitting nightly.  Next scheduled summary report on 01/26/21.  ROV with Dr. Lovena Le on 02/07/21.

## 2020-11-21 ENCOUNTER — Ambulatory Visit (HOSPITAL_COMMUNITY)
Admission: RE | Admit: 2020-11-21 | Discharge: 2020-11-21 | Disposition: A | Discharge status: Home / Self Care | Payer: Medicaid Other | Source: Ambulatory Visit | Attending: Surgery | Admitting: Surgery

## 2020-11-21 ENCOUNTER — Encounter: Payer: Self-pay | Admitting: Surgery

## 2020-11-21 ENCOUNTER — Ambulatory Visit (INDEPENDENT_AMBULATORY_CARE_PROVIDER_SITE_OTHER)
Admission: RE | Admit: 2020-11-21 | Discharge: 2020-11-21 | Disposition: A | Discharge status: Home / Self Care | Payer: Medicaid Other | Source: Ambulatory Visit | Attending: Surgery | Admitting: Surgery

## 2020-11-21 ENCOUNTER — Other Ambulatory Visit: Payer: Self-pay

## 2020-11-21 ENCOUNTER — Ambulatory Visit (INDEPENDENT_AMBULATORY_CARE_PROVIDER_SITE_OTHER): Payer: Medicaid Other | Admitting: Surgery

## 2020-11-21 VITALS — BP 129/82 | HR 55 | Temp 97.9°F | Resp 20 | Ht 67.0 in | Wt 220.0 lb

## 2020-11-21 DIAGNOSIS — I6521 Occlusion and stenosis of right carotid artery: Secondary | ICD-10-CM

## 2020-11-21 DIAGNOSIS — I70213 Atherosclerosis of native arteries of extremities with intermittent claudication, bilateral legs: Secondary | ICD-10-CM

## 2020-11-21 DIAGNOSIS — I6523 Occlusion and stenosis of bilateral carotid arteries: Secondary | ICD-10-CM | POA: Diagnosis not present

## 2020-11-21 DIAGNOSIS — I739 Peripheral vascular disease, unspecified: Secondary | ICD-10-CM

## 2020-11-21 NOTE — Progress Notes (Signed)
Vascular and Vein Specialist of [facility]  Patient name: [name] MRN: [id number] DOB: 1961-03-10 Sex: male   REASON FOR VISIT:    Follow up  HISOTRY OF PRESENT ILLNESS:    [name] is a 60 y.o. male who initially presented with claudication in the right leg as well as a wound in 2016.  ABI on the right was 0.34 and normal on the left.  On 01/17/2015 he underwent angiography revealing a distal superficial femoral and proximal popliteal artery occlusion which was successfully recanalized and stented using overlapping 6 mm Cordis stents.  At the same time the right common iliac artery was treated using an 8 x 39 balloon expandable stent.  The ulcer healed.  He then presented in 2017 with recurrent symptoms of claudication at less than 50 feet.  He underwent angiography on 12/20/2015 and was found to have several areas of stenosis within the stent which were successfully treated with drug-coated balloon angioplasty.  Today he states that he has trouble walking around because his legs will fatigue.  He does not have any open wounds or rest pain.  From a neurologic perspective, he does endorse some vision narrowing and darkening that occurs approximately once a month in his right eye.  He denies any other symptoms.  Patient suffers from coronary artery disease and is status post CABG in [id number] which was complicated by sternal wound.  He suffers from ischemic cardiomyopathy with chronic systolic congestive heart failure on Entresto.  He had a ICD implanted in June 2022.  He takes a statin for hypercholesterolemia.  He is on Eliquis for atrial fibrillation.  He is a diabetic. PAST MEDICAL HISTORY:   Past Medical History:  Diagnosis Date   Anxiety    CAD (coronary artery disease)    Chronic back pain    Colon polyps    Diabetes mellitus    Heart attack ([place])    Hyperlipidemia    Hypertension    Panic attacks    PVD (peripheral vascular  disease) ([place])      FAMILY HISTORY:   Family History  Problem Relation Age of Onset   Diabetes Father    Kidney failure Father    Hypertension Father    Hyperlipidemia Father    Heart disease Father     SOCIAL HISTORY:   Social History   Tobacco Use   Smoking status: Former    Packs/day: 0.25    Years: 25.00    Pack years: 6.25    Types: Cigarettes    Start date: 1978    Quit date: 10/23/2019    Years since quitting: 1.0   Smokeless tobacco: Never   Tobacco comments:    1/4 pack per day  Substance Use Topics   Alcohol use: No    Alcohol/week: 0.0 standard drinks     ALLERGIES:   Allergies  Allergen Reactions   Codeine Shortness Of Breath and Itching   Hydrocodone Shortness Of Breath, Nausea Only and Rash    Breathing problems ALSO   Gabapentin Other (See Comments)    Suicidal thoughts, extreme dreams   Pregabalin Other (See Comments)    Suicidal thoughts, extreme dreams    Simvastatin Other (See Comments)    Pain in joints   Lisinopril Itching and Rash   Venlafaxine Rash     CURRENT MEDICATIONS:   Current Outpatient Medications  Medication Sig Dispense Refill   apixaban (ELIQUIS) 5 MG TABS tablet Take 1 tablet (5 mg total) by mouth  2 (two) times daily. 180 tablet 3   atorvastatin (LIPITOR) 40 MG tablet Take 1 tablet (40 mg total) by mouth daily. 90 tablet 3   carvedilol (COREG) 25 MG tablet Take 1 tablet (25 mg total) by mouth 2 (two) times daily. 180 tablet 3   Dulaglutide 1.5 MG/0.5ML SOPN Inject 1.5 mg into the skin once a week.     empagliflozin (JARDIANCE) 25 MG TABS tablet Take 1 tablet (25 mg total) by mouth daily before breakfast. 30 tablet    furosemide (LASIX) 40 MG tablet Take 1 tablet (40 mg total) by mouth daily. TAKE '80MG'$  X3D THEN BACK TO '40MG'$  93 tablet 3   metFORMIN (GLUCOPHAGE) 1000 MG tablet Take 1,000 mg by mouth 2 (two) times daily.     Multiple Vitamins-Minerals (CENTRUM SILVER) tablet Take 1 tablet by mouth once a week.     OXYGEN  Inhale 3 L/min into the lungs at bedtime as needed (for shortness of breath).     sacubitril-valsartan (ENTRESTO) 97-103 MG Take 1 tablet by mouth 2 (two) times daily. 180 tablet 3   spironolactone (ALDACTONE) 25 MG tablet Take 1 tablet (25 mg total) by mouth daily. 90 tablet 3   traMADol (ULTRAM) 50 MG tablet Take 50 mg by mouth every 6 (six) hours as needed for pain. (Patient not taking: No sig reported)     TRESIBA FLEXTOUCH 100 UNIT/ML FlexTouch Pen Inject 30 Units into the skin daily.     triamcinolone (KENALOG) 0.025 % cream Apply 1 application topically daily as needed (Breakout).     valACYclovir (VALTREX) 1000 MG tablet Take 1,000 mg by mouth 3 (three) times daily. (Patient not taking: Reported on 11/04/2020)     No current facility-administered medications for this visit.    REVIEW OF SYSTEMS:   '[X]'$  denotes positive finding, '[ ]'$  denotes negative finding Cardiac  Comments:  Chest pain or chest pressure:    Shortness of breath upon exertion:    Short of breath when lying flat:    Irregular heart rhythm: x       Vascular    Pain in calf, thigh, or hip brought on by ambulation: x   Pain in feet at night that wakes you up from your sleep:     Blood clot in your veins:    Leg swelling:         Pulmonary    Oxygen at home:    Productive cough:     Wheezing:         Neurologic    Sudden weakness in arms or legs:     Sudden numbness in arms or legs:     Sudden onset of difficulty speaking or slurred speech:    Temporary loss of vision in one eye:  x   Problems with dizziness:         Gastrointestinal    Blood in stool:     Vomited blood:         Genitourinary    Burning when urinating:     Blood in urine:        Psychiatric    Major depression:         Hematologic    Bleeding problems:    Problems with blood clotting too easily:        Skin    Rashes or ulcers:        Constitutional    Fever or chills:      PHYSICAL EXAM:   There were no  vitals filed for  this visit.  GENERAL: The patient is a well-nourished male, in no acute distress. The vital signs are documented above. CARDIAC: There is a regular rate and rhythm.  VASCULAR: Nonpalpable pedal pulses PULMONARY: Non-labored respirations MUSCULOSKELETAL: There are no major deformities or cyanosis. NEUROLOGIC: No focal weakness or paresthesias are detected. SKIN: There are no ulcers or rashes noted. PSYCHIATRIC: The patient has a normal affect.  STUDIES:   I have reviewed the following: +-------+-----------+-----------+------------+------------+  ABI/TBIToday's ABIToday's TBIPrevious ABIPrevious TBI  +-------+-----------+-----------+------------+------------+  Right  0.84       0.46       0.7                       +-------+-----------+-----------+------------+------------+  Left   0.99       0.56       0.92                      +-------+-----------+-----------+------------+------------+  Right: Stenosis is noted within the proximal stent stent.   Left: 50-74% stenosis in the proximal popliteal artery. The ATA appears to  be retrograde.  Carotid: Right Carotid: Velocities in the right ICA are consistent with a 80-99%                 stenosis.   Left Carotid: Velocities in the left ICA are consistent with a 60-79%  stenosis.   Vertebrals:  Bilateral vertebral arteries demonstrate antegrade flow.  Subclavians: Normal flow hemodynamics were seen in bilateral subclavian               arteries.   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  The right side is greater than 80%.  He does endorse what potentially could be considered amaurosis in the right eye.  I am sending him for CT scan of the neck to see if he is a TCAR candidate.  He will follow-up in 1 week after the scan.  PAD: Patient suffers from claudication in both legs.  His ABIs are falsely elevated as he has monophasic waveforms.  He likely has bilateral inflow  disease and outflow disease.  He will need angiography, once he has recovered from his carotid intervention.    Leia Alf, MD, FACS [facility]ists of [facility] [phone] Pager [phone]

## 2020-11-22 ENCOUNTER — Other Ambulatory Visit: Payer: Self-pay

## 2020-11-22 DIAGNOSIS — I6523 Occlusion and stenosis of bilateral carotid arteries: Secondary | ICD-10-CM

## 2020-11-25 ENCOUNTER — Other Ambulatory Visit: Payer: Self-pay

## 2020-11-25 ENCOUNTER — Ambulatory Visit (HOSPITAL_COMMUNITY)
Admission: RE | Admit: 2020-11-25 | Discharge: 2020-11-25 | Disposition: A | Discharge status: Home / Self Care | Payer: Medicaid Other | Source: Ambulatory Visit | Attending: Surgery | Admitting: Surgery

## 2020-11-25 ENCOUNTER — Encounter (HOSPITAL_COMMUNITY): Payer: Self-pay

## 2020-11-25 DIAGNOSIS — I6523 Occlusion and stenosis of bilateral carotid arteries: Secondary | ICD-10-CM | POA: Insufficient documentation

## 2020-11-25 LAB — POCT I-STAT CREATININE: Creatinine, Ser: 1.1 mg/dL (ref 0.61–1.24)

## 2020-11-25 MED ORDER — IOHEXOL 350 MG/ML SOLN
100.0000 mL | Freq: Once | INTRAVENOUS | Status: AC | PRN
  Administered 2020-11-25: 80 mL via INTRAVENOUS

## 2020-11-28 ENCOUNTER — Ambulatory Visit (INDEPENDENT_AMBULATORY_CARE_PROVIDER_SITE_OTHER): Payer: Medicaid Other | Admitting: Surgery

## 2020-11-28 ENCOUNTER — Other Ambulatory Visit: Payer: Self-pay

## 2020-11-28 ENCOUNTER — Encounter: Payer: Self-pay | Admitting: Surgery

## 2020-11-28 VITALS — BP 146/78 | HR 56 | Temp 97.9°F | Resp 20 | Ht 67.0 in | Wt 223.0 lb

## 2020-11-28 DIAGNOSIS — I6523 Occlusion and stenosis of bilateral carotid arteries: Secondary | ICD-10-CM

## 2020-11-28 DIAGNOSIS — I70213 Atherosclerosis of native arteries of extremities with intermittent claudication, bilateral legs: Secondary | ICD-10-CM

## 2020-11-28 MED ORDER — CLOPIDOGREL BISULFATE 75 MG PO TABS: 75.0000 mg | ORAL_TABLET | Freq: Every day | ORAL | 11 refills | Status: DC

## 2020-11-28 NOTE — Progress Notes (Signed)
Vascular and Vein Specialist of [facility]  Patient name: [name] MRN: [id number] DOB: [birth year] Sex: male   REASON FOR VISIT:    Follow up  HISOTRY OF PRESENT ILLNESS:    [name] is a 60 y.o. male who initially presented with claudication in the right leg as well as a wound in 2016.  ABI on the right was 0.34 and normal on the left.  On 01/17/2015 he underwent angiography revealing a distal superficial femoral and proximal popliteal artery occlusion which was successfully recanalized and stented using overlapping 6 mm Cordis stents.  At the same time the right common iliac artery was treated using an 8 x 39 balloon expandable stent.  The ulcer healed.  He then presented in 2017 with recurrent symptoms of claudication at less than 50 feet.  He underwent angiography on 12/20/2015 and was found to have several areas of stenosis within the stent which were successfully treated with drug-coated balloon angioplasty.   Today he states that he has trouble walking around because his legs will fatigue.  He does not have any open wounds or rest pain.  From a neurologic perspective, he does endorse some vision narrowing and darkening that occurs approximately once a month in his right eye.  He denies any other symptoms.  His carotid duplex showed high-grade bilateral lesions and so I sent him for a CT scan to better evaluate this.  He has not had any new symptoms   Patient suffers from coronary artery disease and is status post CABG in 123XX123 which was complicated by sternal wound.  He suffers from ischemic cardiomyopathy with chronic systolic congestive heart failure on Entresto.  He had a ICD implanted in June 2022.  He takes a statin for hypercholesterolemia.  He is on Eliquis for atrial fibrillation.  He is a diabetic.   PAST MEDICAL HISTORY:   Past Medical History:  Diagnosis Date   Anxiety    CAD (coronary artery disease)    Chronic back pain     Colon polyps    Diabetes mellitus    Heart attack ([place])    Hyperlipidemia    Hypertension    Panic attacks    PVD (peripheral vascular disease) ([place])      FAMILY HISTORY:   Family History  Problem Relation Age of Onset   Diabetes Father    Kidney failure Father    Hypertension Father    Hyperlipidemia Father    Heart disease Father     SOCIAL HISTORY:   Social History   Tobacco Use   Smoking status: Former    Packs/day: 0.25    Years: 25.00    Pack years: 6.25    Types: Cigarettes    Start date: 1978    Quit date: 10/23/2019    Years since quitting: 1.1   Smokeless tobacco: Never   Tobacco comments:    1/4 pack per day  Substance Use Topics   Alcohol use: No    Alcohol/week: 0.0 standard drinks     ALLERGIES:   Allergies  Allergen Reactions   Codeine Shortness Of Breath and Itching   Hydrocodone Shortness Of Breath, Nausea Only and Rash    Breathing problems ALSO   Gabapentin Other (See Comments)    Suicidal thoughts, extreme dreams   Pregabalin Other (See Comments)    Suicidal thoughts, extreme dreams    Simvastatin Other (See Comments)    Pain in joints   Lisinopril Itching and Rash   Venlafaxine  Rash     CURRENT MEDICATIONS:   Current Outpatient Medications  Medication Sig Dispense Refill   Dulaglutide 1.5 MG/0.5ML SOPN Inject 1.5 mg into the skin once a week.     empagliflozin (JARDIANCE) 25 MG TABS tablet Take 1 tablet (25 mg total) by mouth daily before breakfast. 30 tablet    metFORMIN (GLUCOPHAGE) 1000 MG tablet Take 1,000 mg by mouth 2 (two) times daily.     Multiple Vitamins-Minerals (CENTRUM SILVER) tablet Take 1 tablet by mouth once a week.     OXYGEN Inhale 3 L/min into the lungs at bedtime as needed (for shortness of breath).     sacubitril-valsartan (ENTRESTO) 97-103 MG Take 1 tablet by mouth 2 (two) times daily. 180 tablet 3   traMADol (ULTRAM) 50 MG tablet Take 50 mg by mouth every 6 (six) hours as needed for pain.     TRESIBA  FLEXTOUCH 100 UNIT/ML FlexTouch Pen Inject 30 Units into the skin daily.     triamcinolone (KENALOG) 0.025 % cream Apply 1 application topically daily as needed (Breakout).     valACYclovir (VALTREX) 1000 MG tablet Take 1,000 mg by mouth 3 (three) times daily.     apixaban (ELIQUIS) 5 MG TABS tablet Take 1 tablet (5 mg total) by mouth 2 (two) times daily. (Patient not taking: Reported on 11/21/2020) 180 tablet 3   atorvastatin (LIPITOR) 40 MG tablet Take 1 tablet (40 mg total) by mouth daily. 90 tablet 3   carvedilol (COREG) 25 MG tablet Take 1 tablet (25 mg total) by mouth 2 (two) times daily. 180 tablet 3   furosemide (LASIX) 40 MG tablet Take 1 tablet (40 mg total) by mouth daily. TAKE '80MG'$  X3D THEN BACK TO '40MG'$  93 tablet 3   spironolactone (ALDACTONE) 25 MG tablet Take 1 tablet (25 mg total) by mouth daily. 90 tablet 3   No current facility-administered medications for this visit.    REVIEW OF SYSTEMS:   '[X]'$  denotes positive finding, '[ ]'$  denotes negative finding Cardiac  Comments:  Chest pain or chest pressure:    Shortness of breath upon exertion:    Short of breath when lying flat:    Irregular heart rhythm:        Vascular    Pain in calf, thigh, or hip brought on by ambulation:    Pain in feet at night that wakes you up from your sleep:     Blood clot in your veins:    Leg swelling:         Pulmonary    Oxygen at home:    Productive cough:     Wheezing:         Neurologic    Sudden weakness in arms or legs:     Sudden numbness in arms or legs:     Sudden onset of difficulty speaking or slurred speech:    Temporary loss of vision in one eye:     Problems with dizziness:  x       Gastrointestinal    Blood in stool:     Vomited blood:         Genitourinary    Burning when urinating:     Blood in urine:        Psychiatric    Major depression:         Hematologic    Bleeding problems:    Problems with blood clotting too easily:        Skin    Rashes or  ulcers:         Constitutional    Fever or chills:      PHYSICAL EXAM:   Vitals:   11/28/20 1110 11/28/20 1113  BP: (!) 150/81 (!) 146/78  Pulse: (!) 56   Resp: 20   Temp: 97.9 F (36.6 C)   SpO2: 95%   Weight: 223 lb (101.2 kg)   Height: '5\' 7"'$  (1.702 m)     GENERAL: The patient is a well-nourished male, in no acute distress. The vital signs are documented above. CARDIAC: There is a regular rate and rhythm.  PULMONARY: Non-labored respirations MUSCULOSKELETAL: There are no major deformities or cyanosis. NEUROLOGIC: No focal weakness or paresthesias are detected. SKIN: There are no ulcers or rashes noted. PSYCHIATRIC: The patient has a normal affect.  STUDIES:   I have reviewed his CT scan with the following findings: 1. Greater than 80% stenosis of the proximal right ICA with evidence of reduced distal flow. 2. 80% proximal left ICA stenosis. 3. Suspected moderate right and mild-to-moderate left vertebral artery origin stenoses. 4. Aortic Atherosclerosis (ICD10-I70.0).   MEDICAL ISSUES:   Possible symptomatic right carotid stenosis.  I have reviewed his CT scan and discussed treatment options.  We have decided to proceed with a right sided TCAR.  I discussed the details of the operation as well as risks and benefits including the risk of stroke and bleeding.  He will need to stop his Eliquis prior to surgery.  I am starting him on aspirin and Plavix today.  He is already on a statin.  I will schedule his operation for Thursday, August 18.  As soon as he recovers from this, I will need to do the left side, and once his carotid arteries are addressed we can focus on his legs.    Leia Alf, MD, FACS [facility] Specialists of [facility] [phone] Pager [phone]

## 2020-11-28 NOTE — H&P (View-Only) (Signed)
[facility]ist of [facility]  Patient name: [name] MRN: [id number] DOB: Sep 26, 1960 Sex: male   REASON FOR VISIT:    Follow up  HISOTRY OF PRESENT ILLNESS:    [name] is a 60 y.o. male who initially presented with claudication in the right leg as well as a wound in 2016.  ABI on the right was 0.34 and normal on the left.  On 01/17/2015 he underwent angiography revealing a distal superficial femoral and proximal popliteal artery occlusion which was successfully recanalized and stented using overlapping 6 mm Cordis stents.  At the same time the right common iliac artery was treated using an 8 x 39 balloon expandable stent.  The ulcer healed.  He then presented in 2017 with recurrent symptoms of claudication at less than 50 feet.  He underwent angiography on 12/20/2015 and was found to have several areas of stenosis within the stent which were successfully treated with drug-coated balloon angioplasty.   Today he states that he has trouble walking around because his legs will fatigue.  He does not have any open wounds or rest pain.  From a neurologic perspective, he does endorse some vision narrowing and darkening that occurs approximately once a month in his right eye.  He denies any other symptoms.  His carotid duplex showed high-grade bilateral lesions and so I sent him for a CT scan to better evaluate this.  He has not had any new symptoms   Patient suffers from coronary artery disease and is status post CABG in [id number] which was complicated by sternal wound.  He suffers from ischemic cardiomyopathy with chronic systolic congestive heart failure on Entresto.  He had a ICD implanted in June 2022.  He takes a statin for hypercholesterolemia.  He is on Eliquis for atrial fibrillation.  He is a diabetic.   PAST MEDICAL HISTORY:   Past Medical History:  Diagnosis Date   Anxiety    CAD (coronary artery disease)    Chronic back pain     Colon polyps    Diabetes mellitus    Heart attack ([place])    Hyperlipidemia    Hypertension    Panic attacks    PVD (peripheral vascular disease) ([place])      FAMILY HISTORY:   Family History  Problem Relation Age of Onset   Diabetes Father    Kidney failure Father    Hypertension Father    Hyperlipidemia Father    Heart disease Father     SOCIAL HISTORY:   Social History   Tobacco Use   Smoking status: Former    Packs/day: 0.25    Years: 25.00    Pack years: 6.25    Types: Cigarettes    Start date: 1978    Quit date: 10/23/2019    Years since quitting: 1.1   Smokeless tobacco: Never   Tobacco comments:    1/4 pack per day  Substance Use Topics   Alcohol use: No    Alcohol/week: 0.0 standard drinks     ALLERGIES:   Allergies  Allergen Reactions   Codeine Shortness Of Breath and Itching   Hydrocodone Shortness Of Breath, Nausea Only and Rash    Breathing problems ALSO   Gabapentin Other (See Comments)    Suicidal thoughts, extreme dreams   Pregabalin Other (See Comments)    Suicidal thoughts, extreme dreams    Simvastatin Other (See Comments)    Pain in joints   Lisinopril Itching and Rash   Venlafaxine  Rash     CURRENT MEDICATIONS:   Current Outpatient Medications  Medication Sig Dispense Refill   Dulaglutide 1.5 MG/0.5ML SOPN Inject 1.5 mg into the skin once a week.     empagliflozin (JARDIANCE) 25 MG TABS tablet Take 1 tablet (25 mg total) by mouth daily before breakfast. 30 tablet    metFORMIN (GLUCOPHAGE) 1000 MG tablet Take 1,000 mg by mouth 2 (two) times daily.     Multiple Vitamins-Minerals (CENTRUM SILVER) tablet Take 1 tablet by mouth once a week.     OXYGEN Inhale 3 L/min into the lungs at bedtime as needed (for shortness of breath).     sacubitril-valsartan (ENTRESTO) 97-103 MG Take 1 tablet by mouth 2 (two) times daily. 180 tablet 3   traMADol (ULTRAM) 50 MG tablet Take 50 mg by mouth every 6 (six) hours as needed for pain.     TRESIBA  FLEXTOUCH 100 UNIT/ML FlexTouch Pen Inject 30 Units into the skin daily.     triamcinolone (KENALOG) 0.025 % cream Apply 1 application topically daily as needed (Breakout).     valACYclovir (VALTREX) 1000 MG tablet Take 1,000 mg by mouth 3 (three) times daily.     apixaban (ELIQUIS) 5 MG TABS tablet Take 1 tablet (5 mg total) by mouth 2 (two) times daily. (Patient not taking: Reported on 11/21/2020) 180 tablet 3   atorvastatin (LIPITOR) 40 MG tablet Take 1 tablet (40 mg total) by mouth daily. 90 tablet 3   carvedilol (COREG) 25 MG tablet Take 1 tablet (25 mg total) by mouth 2 (two) times daily. 180 tablet 3   furosemide (LASIX) 40 MG tablet Take 1 tablet (40 mg total) by mouth daily. TAKE '80MG'$  X3D THEN BACK TO '40MG'$  93 tablet 3   spironolactone (ALDACTONE) 25 MG tablet Take 1 tablet (25 mg total) by mouth daily. 90 tablet 3   No current facility-administered medications for this visit.    REVIEW OF SYSTEMS:   '[X]'$  denotes positive finding, '[ ]'$  denotes negative finding Cardiac  Comments:  Chest pain or chest pressure:    Shortness of breath upon exertion:    Short of breath when lying flat:    Irregular heart rhythm:        Vascular    Pain in calf, thigh, or hip brought on by ambulation:    Pain in feet at night that wakes you up from your sleep:     Blood clot in your veins:    Leg swelling:         Pulmonary    Oxygen at home:    Productive cough:     Wheezing:         Neurologic    Sudden weakness in arms or legs:     Sudden numbness in arms or legs:     Sudden onset of difficulty speaking or slurred speech:    Temporary loss of vision in one eye:     Problems with dizziness:  x       Gastrointestinal    Blood in stool:     Vomited blood:         Genitourinary    Burning when urinating:     Blood in urine:        Psychiatric    Major depression:         Hematologic    Bleeding problems:    Problems with blood clotting too easily:        Skin    Rashes or  ulcers:         Constitutional    Fever or chills:      PHYSICAL EXAM:   Vitals:   11/28/20 1110 11/28/20 1113  BP: (!) 150/81 (!) 146/78  Pulse: (!) 56   Resp: 20   Temp: 97.9 F (36.6 C)   SpO2: 95%   Weight: 223 lb (101.2 kg)   Height: '5\' 7"'$  (1.702 m)     GENERAL: The patient is a well-nourished male, in no acute distress. The vital signs are documented above. CARDIAC: There is a regular rate and rhythm.  PULMONARY: Non-labored respirations MUSCULOSKELETAL: There are no major deformities or cyanosis. NEUROLOGIC: No focal weakness or paresthesias are detected. SKIN: There are no ulcers or rashes noted. PSYCHIATRIC: The patient has a normal affect.  STUDIES:   I have reviewed his CT scan with the following findings: 1. Greater than 80% stenosis of the proximal right ICA with evidence of reduced distal flow. 2. 80% proximal left ICA stenosis. 3. Suspected moderate right and mild-to-moderate left vertebral artery origin stenoses. 4. Aortic Atherosclerosis (ICD10-I70.0).   MEDICAL ISSUES:   Possible symptomatic right carotid stenosis.  I have reviewed his CT scan and discussed treatment options.  We have decided to proceed with a right sided TCAR.  I discussed the details of the operation as well as risks and benefits including the risk of stroke and bleeding.  He will need to stop his Eliquis prior to surgery.  I am starting him on aspirin and Plavix today.  He is already on a statin.  I will schedule his operation for Thursday, August 18.  As soon as he recovers from this, I will need to do the left side, and once his carotid arteries are addressed we can focus on his legs.    Leia Alf, MD, FACS [facility] of [facility] [phone] Pager [phone]

## 2020-11-30 ENCOUNTER — Encounter (HOSPITAL_COMMUNITY): Payer: Self-pay | Admitting: Surgery

## 2020-11-30 ENCOUNTER — Other Ambulatory Visit: Payer: Self-pay

## 2020-11-30 NOTE — Progress Notes (Signed)
Discussed pt's past medical history with Dr. Tobias Alexander from anesthesia, and he advised that the  pt will be evaluated day of surgery.

## 2020-11-30 NOTE — Anesthesia Preprocedure Evaluation (Addendum)
Anesthesia Evaluation  Patient identified by MRN, date of birth, ID band Patient awake    Reviewed: Allergy & Precautions, NPO status , Patient's Chart, lab work & pertinent test results, reviewed documented beta blocker date and time   Airway Mallampati: III  TM Distance: >3 FB Neck ROM: Full    Dental  (+) Dental Advisory Given, Edentulous Lower, Edentulous Upper   Pulmonary former smoker,    Pulmonary exam normal breath sounds clear to auscultation       Cardiovascular hypertension, Pt. on home beta blockers and Pt. on medications + CAD, + Past MI, + Cardiac Stents, + CABG, + Peripheral Vascular Disease and +CHF  Normal cardiovascular exam+ Cardiac Defibrillator  Rhythm:Regular Rate:Normal  Echo 06/04/20: 1. Left ventricular ejection fraction, by estimation, is 25 to 30%. The  left ventricle has severely decreased function. The left ventricle  demonstrates regional wall motion abnormalities (see scoring  diagram/findings for description). There is mild  concentric left ventricular hypertrophy. Left ventricular diastolic  parameters are consistent with Grade III diastolic dysfunction  (restrictive).  2. Right ventricular systolic function is moderately reduced. The right  ventricular size is normal. There is normal pulmonary artery systolic  pressure. The estimated right ventricular systolic pressure is [id number] mmHg.  3. Left atrial size was severely dilated.  4. The mitral valve is normal in structure. No evidence of mitral valve  regurgitation. No evidence of mitral stenosis.  5. Tricuspid regurgitation is eccentric; mild but may be underestimated  in this study.  6. The aortic valve is grossly normal. Aortic valve regurgitation is not  visualized.  7. The inferior vena cava is normal in size with greater than 50%  respiratory variability, suggesting right atrial pressure of 3 mmHg.   Neuro/Psych PSYCHIATRIC DISORDERS  Anxiety negative neurological ROS     GI/Hepatic negative GI ROS, Neg liver ROS,   Endo/Other  diabetes, Type 2, Oral Hypoglycemic Agents, Insulin DependentObesity   Renal/GU negative Renal ROS     Musculoskeletal negative musculoskeletal ROS (+)   Abdominal   Peds  Hematology  (+) Blood dyscrasia (Eliquis; Plavix), ,   Anesthesia Other Findings   Reproductive/Obstetrics                            Anesthesia Physical Anesthesia Plan  ASA: 4  Anesthesia Plan: General   Post-op Pain Management:    Induction: Intravenous  PONV Risk Score and Plan: 2 and Midazolam, Dexamethasone and Ondansetron  Airway Management Planned: Oral ETT  Additional Equipment: Arterial line  Intra-op Plan:   Post-operative Plan: Extubation in OR  Informed Consent: I have reviewed the patients History and Physical, chart, labs and discussed the procedure including the risks, benefits and alternatives for the proposed anesthesia with the patient or authorized representative who has indicated his/her understanding and acceptance.     Dental advisory given  Plan Discussed with: CRNA  Anesthesia Plan Comments: (2nd PIV)       Anesthesia Quick Evaluation

## 2020-11-30 NOTE — Progress Notes (Signed)
30 minute pre-op phone call completed with patient.  He voiced understanding of the following instruction: NPO at midnight, check cbg before leaving home, if cbg <70 drink 4 oz clear juice. Okay to take Plavix, carvedilol, lipitor, and half of regular dose of Tresiba. States he has not been taking eliquis.  Voiced understanding of showering and hygiene before surgery

## 2020-11-30 NOTE — Progress Notes (Signed)
[name] from [facility] made aware of the pt's procedure and time for 12/01/20.

## 2020-12-01 ENCOUNTER — Encounter (HOSPITAL_COMMUNITY): Payer: Self-pay | Admitting: Surgery

## 2020-12-01 ENCOUNTER — Inpatient Hospital Stay (HOSPITAL_COMMUNITY): Payer: Medicaid Other | Admitting: Physician Assistant

## 2020-12-01 ENCOUNTER — Observation Stay (HOSPITAL_COMMUNITY): Payer: Medicaid Other

## 2020-12-01 ENCOUNTER — Encounter: Payer: Self-pay | Admitting: Internal Medicine

## 2020-12-01 ENCOUNTER — Inpatient Hospital Stay (HOSPITAL_COMMUNITY): Payer: Medicaid Other

## 2020-12-01 ENCOUNTER — Observation Stay (HOSPITAL_COMMUNITY)
Admission: RE | Admit: 2020-12-01 | Discharge: 2020-12-02 | DRG: 035 | Disposition: A | Discharge status: Home / Self Care | Payer: Medicaid Other | Attending: Surgery | Admitting: Surgery

## 2020-12-01 ENCOUNTER — Other Ambulatory Visit: Payer: Self-pay

## 2020-12-01 ENCOUNTER — Encounter (HOSPITAL_COMMUNITY)
Admission: RE | Disposition: A | Discharge status: Home / Self Care | Payer: Self-pay | Source: Home / Self Care | Attending: Surgery

## 2020-12-01 DIAGNOSIS — Z95810 Presence of automatic (implantable) cardiac defibrillator: Secondary | ICD-10-CM | POA: Diagnosis not present

## 2020-12-01 DIAGNOSIS — Z83438 Family history of other disorder of lipoprotein metabolism and other lipidemia: Secondary | ICD-10-CM

## 2020-12-01 DIAGNOSIS — I6529 Occlusion and stenosis of unspecified carotid artery: Secondary | ICD-10-CM | POA: Diagnosis present

## 2020-12-01 DIAGNOSIS — Z20822 Contact with and (suspected) exposure to covid-19: Secondary | ICD-10-CM | POA: Diagnosis not present

## 2020-12-01 DIAGNOSIS — F410 Panic disorder [episodic paroxysmal anxiety] without agoraphobia: Secondary | ICD-10-CM | POA: Diagnosis not present

## 2020-12-01 DIAGNOSIS — Z7984 Long term (current) use of oral hypoglycemic drugs: Secondary | ICD-10-CM | POA: Diagnosis not present

## 2020-12-01 DIAGNOSIS — Z885 Allergy status to narcotic agent status: Secondary | ICD-10-CM

## 2020-12-01 DIAGNOSIS — E1151 Type 2 diabetes mellitus with diabetic peripheral angiopathy without gangrene: Secondary | ICD-10-CM | POA: Diagnosis not present

## 2020-12-01 DIAGNOSIS — I252 Old myocardial infarction: Secondary | ICD-10-CM

## 2020-12-01 DIAGNOSIS — Z87891 Personal history of nicotine dependence: Secondary | ICD-10-CM | POA: Diagnosis not present

## 2020-12-01 DIAGNOSIS — I110 Hypertensive heart disease with heart failure: Secondary | ICD-10-CM | POA: Diagnosis not present

## 2020-12-01 DIAGNOSIS — R519 Headache, unspecified: Secondary | ICD-10-CM | POA: Diagnosis not present

## 2020-12-01 DIAGNOSIS — F419 Anxiety disorder, unspecified: Secondary | ICD-10-CM | POA: Diagnosis present

## 2020-12-01 DIAGNOSIS — I5022 Chronic systolic (congestive) heart failure: Secondary | ICD-10-CM | POA: Diagnosis present

## 2020-12-01 DIAGNOSIS — Z955 Presence of coronary angioplasty implant and graft: Secondary | ICD-10-CM

## 2020-12-01 DIAGNOSIS — Z888 Allergy status to other drugs, medicaments and biological substances status: Secondary | ICD-10-CM

## 2020-12-01 DIAGNOSIS — Z8719 Personal history of other diseases of the digestive system: Secondary | ICD-10-CM

## 2020-12-01 DIAGNOSIS — Z79899 Other long term (current) drug therapy: Secondary | ICD-10-CM

## 2020-12-01 DIAGNOSIS — Z841 Family history of disorders of kidney and ureter: Secondary | ICD-10-CM | POA: Diagnosis not present

## 2020-12-01 DIAGNOSIS — Z833 Family history of diabetes mellitus: Secondary | ICD-10-CM | POA: Diagnosis not present

## 2020-12-01 DIAGNOSIS — Z7901 Long term (current) use of anticoagulants: Secondary | ICD-10-CM

## 2020-12-01 DIAGNOSIS — Z8249 Family history of ischemic heart disease and other diseases of the circulatory system: Secondary | ICD-10-CM | POA: Diagnosis not present

## 2020-12-01 DIAGNOSIS — I2510 Atherosclerotic heart disease of native coronary artery without angina pectoris: Secondary | ICD-10-CM | POA: Diagnosis not present

## 2020-12-01 DIAGNOSIS — I6521 Occlusion and stenosis of right carotid artery: Secondary | ICD-10-CM | POA: Diagnosis present

## 2020-12-01 DIAGNOSIS — E785 Hyperlipidemia, unspecified: Secondary | ICD-10-CM | POA: Diagnosis present

## 2020-12-01 HISTORY — PX: TRANSCAROTID ARTERY REVASCULARIZATIONÂ: SHX6778

## 2020-12-01 HISTORY — PX: ULTRASOUND GUIDANCE FOR VASCULAR ACCESS: SHX6516

## 2020-12-01 HISTORY — DX: Angina pectoris, unspecified: I20.9

## 2020-12-01 HISTORY — DX: Heart failure, unspecified: I50.9

## 2020-12-01 HISTORY — DX: Sleep apnea, unspecified: G47.30

## 2020-12-01 HISTORY — DX: Polyneuropathy, unspecified: G62.9

## 2020-12-01 HISTORY — DX: Presence of automatic (implantable) cardiac defibrillator: Z95.810

## 2020-12-01 LAB — URINALYSIS, ROUTINE W REFLEX MICROSCOPIC
Bacteria, UA: NONE SEEN
Bilirubin Urine: NEGATIVE
Glucose, UA: >=500 mg/dL — AB
Hgb urine dipstick: NEGATIVE
Ketones, ur: NEGATIVE mg/dL
Leukocytes,Ua: NEGATIVE
Nitrite: NEGATIVE
Protein, ur: 30 mg/dL — AB
Specific Gravity, Urine: 1.028 (ref 1.005–1.030)
pH: 5 (ref 5.0–8.0)

## 2020-12-01 LAB — COMPREHENSIVE METABOLIC PANEL
ALT: 23 U/L (ref 0–44)
AST: 20 U/L (ref 15–41)
Albumin: 3.3 g/dL — ABNORMAL LOW (ref 3.5–5.0)
Alkaline Phosphatase: 66 U/L (ref 38–126)
Anion gap: 15 (ref 5–15)
BUN: 26 mg/dL — ABNORMAL HIGH (ref 6–20)
CO2: 28 mmol/L (ref 22–32)
Calcium: 9.2 mg/dL (ref 8.9–10.3)
Chloride: 95 mmol/L — ABNORMAL LOW (ref 98–111)
Creatinine, Ser: 1.58 mg/dL — ABNORMAL HIGH (ref 0.61–1.24)
GFR, Estimated: 50 mL/min — ABNORMAL LOW (ref >60)
Glucose, Bld: 248 mg/dL — ABNORMAL HIGH (ref 70–99)
Potassium: 4 mmol/L (ref 3.5–5.1)
Sodium: 138 mmol/L (ref 135–145)
Total Bilirubin: 0.5 mg/dL (ref 0.3–1.2)
Total Protein: 6.3 g/dL — ABNORMAL LOW (ref 6.5–8.1)

## 2020-12-01 LAB — POCT I-STAT 7, (LYTES, BLD GAS, ICA,H+H)
Acid-Base Excess: 6 mmol/L — ABNORMAL HIGH (ref 0.0–2.0)
Bicarbonate: 30.7 mmol/L — ABNORMAL HIGH (ref 20.0–28.0)
Calcium, Ion: 1.2 mmol/L (ref 1.15–1.40)
HCT: 43 % (ref 39.0–52.0)
Hemoglobin: 14.6 g/dL (ref 13.0–17.0)
O2 Saturation: 94 %
Patient temperature: 36
Potassium: 4 mmol/L (ref 3.5–5.1)
Sodium: 139 mmol/L (ref 135–145)
TCO2: 32 mmol/L (ref 22–32)
pCO2 arterial: 42.5 mmHg (ref 32.0–48.0)
pH, Arterial: 7.463 — ABNORMAL HIGH (ref 7.350–7.450)
pO2, Arterial: 63 mmHg — ABNORMAL LOW (ref 83.0–108.0)

## 2020-12-01 LAB — HEMOGLOBIN A1C
Hgb A1c MFr Bld: 9.6 % — ABNORMAL HIGH (ref 4.8–5.6)
Mean Plasma Glucose: 228.82 mg/dL

## 2020-12-01 LAB — TYPE AND SCREEN
ABO/RH(D): A POS
Antibody Screen: NEGATIVE

## 2020-12-01 LAB — PROTIME-INR
INR: 1.2 (ref 0.8–1.2)
Prothrombin Time: 15.3 seconds — ABNORMAL HIGH (ref 11.4–15.2)

## 2020-12-01 LAB — SURGICAL PCR SCREEN
MRSA, PCR: POSITIVE — AB
Staphylococcus aureus: POSITIVE — AB

## 2020-12-01 LAB — SARS CORONAVIRUS 2 BY RT PCR (HOSPITAL ORDER, PERFORMED IN ~~LOC~~ HOSPITAL LAB): SARS Coronavirus 2: NEGATIVE

## 2020-12-01 LAB — APTT: aPTT: 33 seconds (ref 24–36)

## 2020-12-01 LAB — GLUCOSE, CAPILLARY
Glucose-Capillary: 248 mg/dL — ABNORMAL HIGH (ref 70–99)
Glucose-Capillary: 227 mg/dL — ABNORMAL HIGH (ref 70–99)
Glucose-Capillary: 120 mg/dL — ABNORMAL HIGH (ref 70–99)
Glucose-Capillary: 171 mg/dL — ABNORMAL HIGH (ref 70–99)
Glucose-Capillary: 235 mg/dL — ABNORMAL HIGH (ref 70–99)

## 2020-12-01 LAB — CBC
HCT: 45.4 % (ref 39.0–52.0)
Hemoglobin: 15.4 g/dL (ref 13.0–17.0)
MCH: 31.6 pg (ref 26.0–34.0)
MCHC: 33.9 g/dL (ref 30.0–36.0)
MCV: 93 fL (ref 80.0–100.0)
Platelets: 235 10*3/uL (ref 150–400)
RBC: 4.88 MIL/uL (ref 4.22–5.81)
RDW: 15.5 % (ref 11.5–15.5)
WBC: 10.3 10*3/uL (ref 4.0–10.5)
nRBC: 0 % (ref 0.0–0.2)

## 2020-12-01 LAB — POCT ACTIVATED CLOTTING TIME: Activated Clotting Time: 260 seconds

## 2020-12-01 SURGERY — TRANSCAROTID ARTERY REVASCULARIZATION (TCAR)
Anesthesia: General | Site: Neck | Laterality: Right

## 2020-12-01 MED ORDER — PHENYLEPHRINE HCL-NACL 20-0.9 MG/250ML-% IV SOLN
INTRAVENOUS | Status: DC | PRN
  Administered 2020-12-01: 30 ug/min via INTRAVENOUS

## 2020-12-01 MED ORDER — PHENYLEPHRINE HCL-NACL 20-0.9 MG/250ML-% IV SOLN
INTRAVENOUS | Status: AC
  Filled 2020-12-01: qty 500

## 2020-12-01 MED ORDER — FUROSEMIDE 40 MG PO TABS
40.0000 mg | ORAL_TABLET | Freq: Every day | ORAL | Status: DC
  Administered 2020-12-01 – 2020-12-02 (×2): 40 mg via ORAL
  Filled 2020-12-01 (×2): qty 1

## 2020-12-01 MED ORDER — ETOMIDATE 2 MG/ML IV SOLN
INTRAVENOUS | Status: DC | PRN
  Administered 2020-12-01: 20 mg via INTRAVENOUS

## 2020-12-01 MED ORDER — MORPHINE SULFATE (PF) 2 MG/ML IV SOLN
2.0000 mg | INTRAVENOUS | Status: DC | PRN
  Administered 2020-12-01 – 2020-12-02 (×7): 2 mg via INTRAVENOUS
  Filled 2020-12-01 (×7): qty 1

## 2020-12-01 MED ORDER — DOCUSATE SODIUM 100 MG PO CAPS
100.0000 mg | ORAL_CAPSULE | Freq: Every day | ORAL | Status: DC
  Administered 2020-12-02: 100 mg via ORAL
  Filled 2020-12-01: qty 1

## 2020-12-01 MED ORDER — ATROPINE SULFATE 0.4 MG/ML IJ SOLN
INTRAMUSCULAR | Status: AC
  Filled 2020-12-01: qty 1

## 2020-12-01 MED ORDER — SODIUM CHLORIDE 0.9 % IV SOLN
0.0125 ug/kg/min | INTRAVENOUS | Status: AC
  Administered 2020-12-01: .05 ug/kg/min via INTRAVENOUS
  Filled 2020-12-01: qty 2000

## 2020-12-01 MED ORDER — CHLORHEXIDINE GLUCONATE CLOTH 2 % EX PADS: 6.0000 | MEDICATED_PAD | Freq: Once | CUTANEOUS | Status: DC

## 2020-12-01 MED ORDER — HEMOSTATIC AGENTS (NO CHARGE) OPTIME
TOPICAL | Status: DC | PRN
  Administered 2020-12-01: 1 via TOPICAL

## 2020-12-01 MED ORDER — PROPOFOL 10 MG/ML IV BOLUS
INTRAVENOUS | Status: AC
  Filled 2020-12-01: qty 40

## 2020-12-01 MED ORDER — LACTATED RINGERS IV SOLN: INTRAVENOUS | Status: DC

## 2020-12-01 MED ORDER — SODIUM CHLORIDE 0.9 % IV SOLN
500.0000 mL | Freq: Once | INTRAVENOUS | Status: DC | PRN
Start: 2020-12-01 — End: 2020-12-02

## 2020-12-01 MED ORDER — DEXAMETHASONE SODIUM PHOSPHATE 10 MG/ML IJ SOLN
INTRAMUSCULAR | Status: DC | PRN
  Administered 2020-12-01: 4 mg via INTRAVENOUS

## 2020-12-01 MED ORDER — LABETALOL HCL 5 MG/ML IV SOLN
10.0000 mg | INTRAVENOUS | Status: DC | PRN
Start: 2020-12-01 — End: 2020-12-02

## 2020-12-01 MED ORDER — PANTOPRAZOLE SODIUM 40 MG PO TBEC
40.0000 mg | DELAYED_RELEASE_TABLET | Freq: Every day | ORAL | Status: DC
  Administered 2020-12-01 – 2020-12-02 (×2): 40 mg via ORAL
  Filled 2020-12-01 (×2): qty 1

## 2020-12-01 MED ORDER — INSULIN ASPART 100 UNIT/ML IJ SOLN
INTRAMUSCULAR | Status: AC
  Administered 2020-12-01: 5 [IU] via SUBCUTANEOUS
  Filled 2020-12-01: qty 1

## 2020-12-01 MED ORDER — LIDOCAINE HCL (PF) 1 % IJ SOLN
INTRAMUSCULAR | Status: AC
  Filled 2020-12-01: qty 5

## 2020-12-01 MED ORDER — MIDAZOLAM HCL 5 MG/5ML IJ SOLN
INTRAMUSCULAR | Status: DC | PRN
  Administered 2020-12-01 (×2): 1 mg via INTRAVENOUS

## 2020-12-01 MED ORDER — HYDRALAZINE HCL 20 MG/ML IJ SOLN: 5.0000 mg | INTRAMUSCULAR | Status: DC | PRN

## 2020-12-01 MED ORDER — SPIRONOLACTONE 25 MG PO TABS
25.0000 mg | ORAL_TABLET | Freq: Every day | ORAL | Status: DC
  Administered 2020-12-02: 25 mg via ORAL
  Filled 2020-12-01: qty 1

## 2020-12-01 MED ORDER — MAGNESIUM SULFATE 2 GM/50ML IV SOLN: 2.0000 g | Freq: Every day | INTRAVENOUS | Status: DC | PRN

## 2020-12-01 MED ORDER — HEPARIN SODIUM (PORCINE) 1000 UNIT/ML IJ SOLN
INTRAMUSCULAR | Status: DC | PRN
  Administered 2020-12-01: 10000 [IU] via INTRAVENOUS

## 2020-12-01 MED ORDER — HEPARIN 6000 UNIT IRRIGATION SOLUTION
Status: DC | PRN
  Administered 2020-12-01: 1

## 2020-12-01 MED ORDER — GLYCOPYRROLATE PF 0.2 MG/ML IJ SOSY
PREFILLED_SYRINGE | INTRAMUSCULAR | Status: AC
  Filled 2020-12-01: qty 1

## 2020-12-01 MED ORDER — MIDAZOLAM HCL 2 MG/2ML IJ SOLN
INTRAMUSCULAR | Status: AC
  Filled 2020-12-01: qty 2

## 2020-12-01 MED ORDER — ORAL CARE MOUTH RINSE: 15.0000 mL | Freq: Once | OROMUCOSAL | Status: AC

## 2020-12-01 MED ORDER — CLOPIDOGREL BISULFATE 75 MG PO TABS
75.0000 mg | ORAL_TABLET | Freq: Every day | ORAL | Status: DC
  Administered 2020-12-02: 75 mg via ORAL
  Filled 2020-12-01: qty 1

## 2020-12-01 MED ORDER — CHLORHEXIDINE GLUCONATE 0.12 % MT SOLN
15.0000 mL | Freq: Once | OROMUCOSAL | Status: AC
  Administered 2020-12-01: 15 mL via OROMUCOSAL
  Filled 2020-12-01: qty 15

## 2020-12-01 MED ORDER — ACETAMINOPHEN 650 MG RE SUPP: 325.0000 mg | RECTAL | Status: DC | PRN

## 2020-12-01 MED ORDER — SENNOSIDES-DOCUSATE SODIUM 8.6-50 MG PO TABS: 1.0000 | ORAL_TABLET | Freq: Every evening | ORAL | Status: DC | PRN

## 2020-12-01 MED ORDER — HEPARIN 6000 UNIT IRRIGATION SOLUTION
Status: AC
  Filled 2020-12-01: qty 500

## 2020-12-01 MED ORDER — 0.9 % SODIUM CHLORIDE (POUR BTL) OPTIME
TOPICAL | Status: DC | PRN
  Administered 2020-12-01: 1000 mL

## 2020-12-01 MED ORDER — ASPIRIN EC 81 MG PO TBEC
81.0000 mg | DELAYED_RELEASE_TABLET | Freq: Every day | ORAL | Status: DC
  Administered 2020-12-02: 81 mg via ORAL
  Filled 2020-12-01: qty 1

## 2020-12-01 MED ORDER — CEFAZOLIN SODIUM-DEXTROSE 2-4 GM/100ML-% IV SOLN
2.0000 g | Freq: Three times a day (TID) | INTRAVENOUS | Status: AC
Start: 2020-12-01 — End: 2020-12-02
  Administered 2020-12-01 – 2020-12-02 (×2): 2 g via INTRAVENOUS
  Filled 2020-12-01 (×2): qty 100

## 2020-12-01 MED ORDER — POTASSIUM CHLORIDE CRYS ER 20 MEQ PO TBCR: 20.0000 meq | EXTENDED_RELEASE_TABLET | Freq: Every day | ORAL | Status: DC | PRN

## 2020-12-01 MED ORDER — ATROPINE SULFATE 0.4 MG/ML IJ SOLN
INTRAMUSCULAR | Status: DC | PRN
  Administered 2020-12-01 (×4): .2 mg via INTRAVENOUS

## 2020-12-01 MED ORDER — ACETAMINOPHEN 500 MG PO TABS
1000.0000 mg | ORAL_TABLET | Freq: Once | ORAL | Status: AC
  Administered 2020-12-01: 1000 mg via ORAL
  Filled 2020-12-01: qty 2

## 2020-12-01 MED ORDER — SODIUM CHLORIDE 0.9 % IV SOLN: INTRAVENOUS | Status: DC

## 2020-12-01 MED ORDER — DEXAMETHASONE SODIUM PHOSPHATE 10 MG/ML IJ SOLN
INTRAMUSCULAR | Status: AC
  Filled 2020-12-01: qty 1

## 2020-12-01 MED ORDER — INSULIN ASPART 100 UNIT/ML IJ SOLN
0.0000 [IU] | Freq: Three times a day (TID) | INTRAMUSCULAR | Status: DC
  Administered 2020-12-01 – 2020-12-02 (×2): 3 [IU] via SUBCUTANEOUS

## 2020-12-01 MED ORDER — ATORVASTATIN CALCIUM 40 MG PO TABS
40.0000 mg | ORAL_TABLET | Freq: Every day | ORAL | Status: DC
  Administered 2020-12-02: 40 mg via ORAL
  Filled 2020-12-01: qty 1

## 2020-12-01 MED ORDER — SACUBITRIL-VALSARTAN 97-103 MG PO TABS
1.0000 | ORAL_TABLET | Freq: Two times a day (BID) | ORAL | Status: DC
  Administered 2020-12-01 – 2020-12-02 (×2): 1 via ORAL
  Filled 2020-12-01 (×2): qty 1

## 2020-12-01 MED ORDER — CEFAZOLIN SODIUM-DEXTROSE 2-4 GM/100ML-% IV SOLN
2.0000 g | INTRAVENOUS | Status: AC
  Administered 2020-12-01: 2 g via INTRAVENOUS
  Filled 2020-12-01: qty 100

## 2020-12-01 MED ORDER — ACETAMINOPHEN 325 MG PO TABS: 325.0000 mg | ORAL_TABLET | ORAL | Status: DC | PRN

## 2020-12-01 MED ORDER — CARVEDILOL 25 MG PO TABS
25.0000 mg | ORAL_TABLET | Freq: Two times a day (BID) | ORAL | Status: DC
  Administered 2020-12-01 – 2020-12-02 (×2): 25 mg via ORAL
  Filled 2020-12-01 (×2): qty 1

## 2020-12-01 MED ORDER — FENTANYL CITRATE (PF) 100 MCG/2ML IJ SOLN
INTRAMUSCULAR | Status: AC
  Filled 2020-12-01: qty 2

## 2020-12-01 MED ORDER — SUGAMMADEX SODIUM 200 MG/2ML IV SOLN
INTRAVENOUS | Status: DC | PRN
  Administered 2020-12-01: 200 mg via INTRAVENOUS

## 2020-12-01 MED ORDER — HEPARIN SODIUM (PORCINE) 5000 UNIT/ML IJ SOLN
5000.0000 [IU] | Freq: Three times a day (TID) | INTRAMUSCULAR | Status: DC
  Administered 2020-12-02: 5000 [IU] via SUBCUTANEOUS
  Filled 2020-12-01: qty 1

## 2020-12-01 MED ORDER — EPHEDRINE SULFATE-NACL 50-0.9 MG/10ML-% IV SOSY
PREFILLED_SYRINGE | INTRAVENOUS | Status: DC | PRN
  Administered 2020-12-01 (×2): 10 mg via INTRAVENOUS

## 2020-12-01 MED ORDER — GUAIFENESIN-DM 100-10 MG/5ML PO SYRP: 15.0000 mL | ORAL_SOLUTION | ORAL | Status: DC | PRN

## 2020-12-01 MED ORDER — INSULIN ASPART 100 UNIT/ML IJ SOLN: 5.0000 [IU] | INTRAMUSCULAR | Status: AC

## 2020-12-01 MED ORDER — ROCURONIUM BROMIDE 10 MG/ML (PF) SYRINGE
PREFILLED_SYRINGE | INTRAVENOUS | Status: AC
  Filled 2020-12-01: qty 10

## 2020-12-01 MED ORDER — TRAMADOL HCL 50 MG PO TABS
50.0000 mg | ORAL_TABLET | Freq: Four times a day (QID) | ORAL | Status: DC | PRN
  Administered 2020-12-01: 50 mg via ORAL
  Filled 2020-12-01: qty 1

## 2020-12-01 MED ORDER — LIDOCAINE 2% (20 MG/ML) 5 ML SYRINGE
INTRAMUSCULAR | Status: AC
  Filled 2020-12-01: qty 5

## 2020-12-01 MED ORDER — PROTAMINE SULFATE 10 MG/ML IV SOLN
INTRAVENOUS | Status: DC | PRN
  Administered 2020-12-01: 10 mg via INTRAVENOUS
  Administered 2020-12-01 (×2): 20 mg via INTRAVENOUS

## 2020-12-01 MED ORDER — CHLORHEXIDINE GLUCONATE CLOTH 2 % EX PADS
6.0000 | MEDICATED_PAD | Freq: Every day | CUTANEOUS | Status: DC
  Administered 2020-12-02: 6 via TOPICAL

## 2020-12-01 MED ORDER — PHENYLEPHRINE 40 MCG/ML (10ML) SYRINGE FOR IV PUSH (FOR BLOOD PRESSURE SUPPORT)
PREFILLED_SYRINGE | INTRAVENOUS | Status: DC | PRN
  Administered 2020-12-01: 40 ug via INTRAVENOUS

## 2020-12-01 MED ORDER — FENTANYL CITRATE (PF) 250 MCG/5ML IJ SOLN
INTRAMUSCULAR | Status: AC
  Filled 2020-12-01: qty 5

## 2020-12-01 MED ORDER — FENTANYL CITRATE (PF) 100 MCG/2ML IJ SOLN
25.0000 ug | INTRAMUSCULAR | Status: DC | PRN
  Administered 2020-12-01 (×4): 50 ug via INTRAVENOUS

## 2020-12-01 MED ORDER — FENTANYL CITRATE (PF) 100 MCG/2ML IJ SOLN
INTRAMUSCULAR | Status: DC | PRN
  Administered 2020-12-01: 100 ug via INTRAVENOUS

## 2020-12-01 MED ORDER — MUPIROCIN 2 % EX OINT
1.0000 "application " | TOPICAL_OINTMENT | Freq: Two times a day (BID) | CUTANEOUS | Status: DC
  Administered 2020-12-01 – 2020-12-02 (×3): 1 via NASAL
  Filled 2020-12-01: qty 22

## 2020-12-01 MED ORDER — GLYCOPYRROLATE PF 0.2 MG/ML IJ SOSY
PREFILLED_SYRINGE | INTRAMUSCULAR | Status: DC | PRN
  Administered 2020-12-01: .2 mg via INTRAVENOUS

## 2020-12-01 MED ORDER — IODIXANOL 320 MG/ML IV SOLN
INTRAVENOUS | Status: DC | PRN
  Administered 2020-12-01: 25 mL via INTRA_ARTERIAL

## 2020-12-01 MED ORDER — ALUM & MAG HYDROXIDE-SIMETH 200-200-20 MG/5ML PO SUSP: 15.0000 mL | ORAL | Status: DC | PRN

## 2020-12-01 MED ORDER — ONDANSETRON HCL 4 MG/2ML IJ SOLN
INTRAMUSCULAR | Status: DC | PRN
  Administered 2020-12-01: 4 mg via INTRAVENOUS

## 2020-12-01 MED ORDER — SUCCINYLCHOLINE CHLORIDE 200 MG/10ML IV SOSY
PREFILLED_SYRINGE | INTRAVENOUS | Status: AC
  Filled 2020-12-01: qty 10

## 2020-12-01 MED ORDER — PHENOL 1.4 % MT LIQD: 1.0000 | OROMUCOSAL | Status: DC | PRN

## 2020-12-01 MED ORDER — ROCURONIUM BROMIDE 10 MG/ML (PF) SYRINGE
PREFILLED_SYRINGE | INTRAVENOUS | Status: DC | PRN
  Administered 2020-12-01: 20 mg via INTRAVENOUS
  Administered 2020-12-01: 60 mg via INTRAVENOUS

## 2020-12-01 MED ORDER — METOPROLOL TARTRATE 5 MG/5ML IV SOLN
2.0000 mg | INTRAVENOUS | Status: DC | PRN
Start: 2020-12-01 — End: 2020-12-02

## 2020-12-01 MED ORDER — LIDOCAINE 2% (20 MG/ML) 5 ML SYRINGE
INTRAMUSCULAR | Status: DC | PRN
  Administered 2020-12-01: 10 mg via INTRAVENOUS

## 2020-12-01 MED ORDER — PROTAMINE SULFATE 10 MG/ML IV SOLN
INTRAVENOUS | Status: AC
  Filled 2020-12-01: qty 5

## 2020-12-01 SURGICAL SUPPLY — 54 items
ADH SKN CLS APL DERMABOND .7 (GAUZE/BANDAGES/DRESSINGS) ×2
AGENT HMST KT MTR STRL THRMB (HEMOSTASIS) ×2
BAG BANDED W/RUBBER/TAPE 36X54 (MISCELLANEOUS) ×4 IMPLANT
BAG COUNTER SPONGE SURGICOUNT (BAG) ×3 IMPLANT
BAG EQP BAND 135X91 W/RBR TAPE (MISCELLANEOUS) ×2
BAG SPNG CNTER NS LX DISP (BAG) ×2
BAG SURGICOUNT SPONGE COUNTING (BAG) ×1
BALLN STERLING RX 5X30X80 (BALLOONS) ×4
BALLN STERLING RX 6X30X80 (BALLOONS) ×4
BALLOON STERLING RX 5X30X80 (BALLOONS) IMPLANT
BALLOON STERLING RX 6X30X80 (BALLOONS) IMPLANT
CANISTER SUCT 3000ML PPV (MISCELLANEOUS) ×4 IMPLANT
CLIP VESOCCLUDE MED 6/CT (CLIP) ×4 IMPLANT
CLIP VESOCCLUDE SM WIDE 6/CT (CLIP) ×4 IMPLANT
COVER DOME SNAP 22 D (MISCELLANEOUS) ×4 IMPLANT
COVER PROBE W GEL 5X96 (DRAPES) ×4 IMPLANT
DERMABOND ADVANCED (GAUZE/BANDAGES/DRESSINGS) ×2
DERMABOND ADVANCED .7 DNX12 (GAUZE/BANDAGES/DRESSINGS) ×2 IMPLANT
DRAPE FEMORAL ANGIO 80X135IN (DRAPES) ×4 IMPLANT
ELECT REM PT RETURN 9FT ADLT (ELECTROSURGICAL) ×4
ELECTRODE REM PT RTRN 9FT ADLT (ELECTROSURGICAL) ×2 IMPLANT
GLOVE SURG POLYISO LF SZ7.5 (GLOVE) ×4 IMPLANT
GLOVE SURG UNDER POLY LF SZ7.5 (GLOVE) ×4 IMPLANT
GOWN STRL REUS W/ TWL LRG LVL3 (GOWN DISPOSABLE) ×4 IMPLANT
GOWN STRL REUS W/ TWL XL LVL3 (GOWN DISPOSABLE) ×2 IMPLANT
GOWN STRL REUS W/TWL LRG LVL3 (GOWN DISPOSABLE) ×8
GOWN STRL REUS W/TWL XL LVL3 (GOWN DISPOSABLE) ×4
GUIDEWIRE ENROUTE 0.014 (WIRE) ×4 IMPLANT
HEMOSTAT SNOW SURGICEL 2X4 (HEMOSTASIS) IMPLANT
INTRODUCER KIT GALT 7CM (INTRODUCER) ×4
KIT BASIN OR (CUSTOM PROCEDURE TRAY) ×4 IMPLANT
KIT ENCORE 26 ADVANTAGE (KITS) ×4 IMPLANT
KIT INTRODUCER GALT 7 (INTRODUCER) ×2 IMPLANT
KIT TURNOVER KIT B (KITS) ×4 IMPLANT
NS IRRIG 1000ML POUR BTL (IV SOLUTION) ×2 IMPLANT
PACK CAROTID (CUSTOM PROCEDURE TRAY) ×4 IMPLANT
POSITIONER HEAD DONUT 9IN (MISCELLANEOUS) ×4 IMPLANT
SET MICROPUNCTURE 5F STIFF (MISCELLANEOUS) ×2 IMPLANT
STENT TRANSCAROTID SYSTEM 9X40 (Permanent Stent) ×2 IMPLANT
SURGIFLO W/THROMBIN 8M KIT (HEMOSTASIS) ×2 IMPLANT
SUT PROLENE 5 0 C 1 24 (SUTURE) ×6 IMPLANT
SUT PROLENE 6 0 BV (SUTURE) IMPLANT
SUT SILK 2 0 PERMA HAND 18 BK (SUTURE) ×4 IMPLANT
SUT SILK 2 0 SH (SUTURE) ×4 IMPLANT
SUT VIC AB 3-0 SH 27 (SUTURE) ×8
SUT VIC AB 3-0 SH 27X BRD (SUTURE) ×4 IMPLANT
SUT VIC AB 4-0 PS2 27 (SUTURE) ×4 IMPLANT
SYR 10ML LL (SYRINGE) ×10 IMPLANT
SYR 20ML LL LF (SYRINGE) ×4 IMPLANT
SYSTEM TRANSCAROTID NEUROPRTCT (MISCELLANEOUS) ×2 IMPLANT
TOWEL GREEN STERILE (TOWEL DISPOSABLE) ×4 IMPLANT
TRANSCAROTID NEUROPROTECT SYS (MISCELLANEOUS) ×4
WATER STERILE IRR 1000ML POUR (IV SOLUTION) ×4 IMPLANT
WIRE BENTSON .035X145CM (WIRE) ×4 IMPLANT

## 2020-12-01 NOTE — Progress Notes (Signed)
Lab called to notify ABG sample needs redrawing. Notified Mateo Flow, CRNA who stated she will do an ISTAT back in [place].

## 2020-12-01 NOTE — Progress Notes (Signed)
  Progress Note    12/01/2020 1:38 PM Day of Surgery  Subjective:  pressure and headache on right side of head, jaw and behind right eye   Vitals:   12/01/20 1210 12/01/20 1250  BP: 119/63 124/66  Pulse: 66 (!) 52  Resp: 13 14  Temp:    SpO2: 96% 96%   Physical Exam: Cardiac:  regular Lungs:  non labored Incisions:  right neck incision is clean, dry and intact. Left vein access site clean, dry and intact without swelling or hematoma Extremities:  moving all extremities without deficits Abdomen:  obese, soft, non tender Neurologic: alert and oriented, CN intact, smile symmetric, tongue midline, speech coherent, sensation is intact and equal bilaterally  CBC    Component Value Date/Time   WBC 10.3 12/01/2020 0604   RBC 4.88 12/01/2020 0604   HGB 15.4 12/01/2020 0604   HGB 15.6 10/19/2020 1031   HCT 45.4 12/01/2020 0604   HCT 47.6 10/19/2020 1031   PLT 235 12/01/2020 0604   PLT 242 10/19/2020 1031   MCV 93.0 12/01/2020 0604   MCV 92 10/19/2020 1031   MCH 31.6 12/01/2020 0604   MCHC 33.9 12/01/2020 0604   RDW 15.5 12/01/2020 0604   RDW 17.1 (H) 10/19/2020 1031   LYMPHSABS 1.7 10/19/2020 1031   MONOABS 1.0 06/03/2020 2200   EOSABS 0.2 10/19/2020 1031   BASOSABS 0.1 10/19/2020 1031    BMET    Component Value Date/Time   NA 138 12/01/2020 0604   NA 137 10/19/2020 1031   K 4.0 12/01/2020 0604   CL 95 (L) 12/01/2020 0604   CO2 28 12/01/2020 0604   GLUCOSE 248 (H) 12/01/2020 0604   BUN 26 (H) 12/01/2020 0604   BUN 28 (H) 10/19/2020 1031   CREATININE 1.58 (H) 12/01/2020 0604   CALCIUM 9.2 12/01/2020 0604   GFRNONAA 50 (L) 12/01/2020 0604   GFRAA >60 12/09/2019 0608    INR    Component Value Date/Time   INR 1.2 12/01/2020 0604     Intake/Output Summary (Last 24 hours) at 12/01/2020 1338 Last data filed at 12/01/2020 0933 Gross per 24 hour  Intake 1100 ml  Output 100 ml  Net 1000 ml     Assessment/Plan:  60 y.o. male is s/p R TCAR Day of Surgery    Neurologically intact. Moving all extremities without deficits VSS Having pressure/ headache in right temporal/parietal region as well as right jaw and behind right eye Just received morphine. Will see if this improves otherwise may need to obtain imaging to make sure stent patent/ no reperfusion injury Routine post operative care Hopeful discharge tomorrow   Marval Regal [facility] [phone] 12/01/2020 1:38 PM

## 2020-12-01 NOTE — Anesthesia Postprocedure Evaluation (Signed)
Anesthesia Post Note  Patient: [name]  Procedure(s) Performed: RIGHT TRANSCAROTID ARTERY REVASCULARIZATION (Right: Neck) ULTRASOUND GUIDANCE FOR VASCULAR ACCESS (Left: Groin)     Patient location during evaluation: PACU Anesthesia Type: General Level of consciousness: awake and alert Pain management: pain level controlled Vital Signs Assessment: post-procedure vital signs reviewed and stable Respiratory status: spontaneous breathing, nonlabored ventilation and respiratory function stable Cardiovascular status: blood pressure returned to baseline, stable and bradycardic Postop Assessment: no apparent nausea or vomiting Anesthetic complications: no   No notable events documented.  Last Vitals:  Vitals:   12/01/20 1400 12/01/20 1500  BP: 125/69 121/65  Pulse: (!) 52 (!) 51  Resp: 17 13  Temp:    SpO2: 97% 97%    Last Pain:  Vitals:   12/01/20 1519  TempSrc:   PainSc: [place]

## 2020-12-01 NOTE — Progress Notes (Signed)
PERIOPERATIVE PRESCRIPTION FOR IMPLANTED CARDIAC DEVICE PROGRAMMING  Patient Information: Name:  BENTO GANTERT  DOB:  18-Oct-1960  MRN:  [id number]     Planned Procedure:  Right Transcarotid Artery Revascularization  Surgeon:  Dr. Harold Barban  Date of Procedure:  12/01/20  Cautery will be used.  Position during surgery:  Supine   Please send documentation back to:  [facility] (Fax # [phone])   Device Information:  Clinic EP Physician:  Cristopher Peru, MD   Device Type:  Defibrillator Manufacturer and Phone #:  [employer]: [phone] Pacemaker Dependent?:  No. Date of Last Device Check:  11/09/20 in-clinic Normal Device Function?:  Yes.    Electrophysiologist's Recommendations:  Have magnet available. Provide continuous ECG monitoring when magnet is used or reprogramming is to be performed.  Procedure may interfere with device function.  Magnet should be placed over device during procedure.  Per Device Clinic Standing Orders, Vergie Living [place], [place]  8:22 AM 12/01/2020

## 2020-12-01 NOTE — Progress Notes (Signed)
Status post right-sided TCAR: Patient is resting comfortably in the PACU. He responds appropriately to questions.  He is moving all 4 extremities to command.  He does not have any visual disturbances.  Once he clears anesthesia protocol, he can be taken to 4 E.  [name]

## 2020-12-01 NOTE — Discharge Instructions (Signed)
   [facility] of [place]  Discharge Instructions   Carotid Surgery  Please refer to the following instructions for your post-procedure care. Your surgeon or physician assistant will discuss any changes with you.  Activity  You are encouraged to walk as much as you can. You can slowly return to normal activities but must avoid strenuous activity and heavy lifting until your doctor tell you it's okay. Avoid activities such as vacuuming or swinging a golf club. You can drive after one week if you are comfortable and you are no longer taking prescription pain medications. It is normal to feel tired for serval weeks after your surgery. It is also normal to have difficulty with sleep habits, eating, and bowel movements after surgery. These will go away with time.  Bathing/Showering  Shower daily after you go home. Do not soak in a bathtub, hot tub, or swim until the incision heals completely.  Incision Care  Shower every day. Clean your incision with mild soap and water. Pat the area dry with a clean towel. You do not need a bandage unless otherwise instructed. Do not apply any ointments or creams to your incision. You may have skin glue on your incision. Do not peel it off. It will come off on its own in about one week. Your incision may feel thickened and raised for several weeks after your surgery. This is normal and the skin will soften over time.   For Men Only: It's okay to shave around the incision but do not shave the incision itself for 2 weeks. It is common to have numbness under your chin that could last for several months.  Diet  Resume your normal diet. There are no special food restrictions following this procedure. A low fat/low cholesterol diet is recommended for all patients with vascular disease. In order to heal from your surgery, it is CRITICAL to get adequate nutrition. Your body requires vitamins, minerals, and protein. Vegetables are the best source of  vitamins and minerals. Vegetables also provide the perfect balance of protein. Processed food has little nutritional value, so try to avoid this.  Medications  Resume taking all of your medications unless your doctor or physician assistant tells you not to. If your incision is causing pain, you may take over-the- counter pain relievers such as acetaminophen (Tylenol). If you were prescribed a stronger pain medication, please be aware these medications can cause nausea and constipation. Prevent nausea by taking the medication with a snack or meal. Avoid constipation by drinking plenty of fluids and eating foods with a high amount of fiber, such as fruits, vegetables, and grains.   Do not take Tylenol if you are taking prescription pain medications.  Follow Up  Our office will schedule a follow up appointment 2-3 weeks following discharge.  Please call us immediately for any of the following conditions  . Increased pain, redness, drainage (pus) from your incision site. . Fever of 101 degrees or higher. . If you should develop stroke (slurred speech, difficulty swallowing, weakness on one side of your body, loss of vision) you should call 911 and go to the nearest emergency room. .  Reduce your risk of vascular disease:  . Stop smoking. If you would like help call [facility] at 1-800-QUIT-NOW (1-800-784-8669) or [place] at [phone]. . Manage your cholesterol . Maintain a desired weight . Control your diabetes . Keep your blood pressure down .  If you have any questions, please call the office at [phone]. 

## 2020-12-01 NOTE — Op Note (Signed)
Patient name: [name] MRN: [id number] DOB: Dec 31, 1960 Sex: male  12/01/2020 Pre-operative Diagnosis: Symptomatic right carotid stenosis Post-operative diagnosis:  Same Surgeon:  [name] Assistants:  Ivin Booty, PA Procedure:   #1: Right carotid stent (TCAR)   #2: Flow reversal neuro protection   #3: Ultrasound-guided placement of left common femoral vein cannula Anesthesia: General Blood Loss: Minimal Specimens: None  Findings: Approximately 99% LAD lesion resolved to less than 10% after stenting.  Stent: 9 x 40 ENROUTE Predilatation balloon: 5 x 30 Postdilatation balloon: 6 x 30 Flow reversal time: 25 minutes Contrast: 30 Dose.  2203.6 Fluoroscopy: 4 minutes Procedure time: 67 minutes  Indications: This is a 60 year old gentleman with high-grade bilateral carotid stenosis.  He does report some visual disturbances in the right eye.  We discussed proceeding with TCAR for stroke prevention.  Procedure:  The patient was identified in the holding area and taken to [address]  The patient was then placed supine on the table. general anesthesia was administered.  The patient was prepped and draped in the usual sterile fashion.  A time out was called and antibiotics were administered.  A PA was necessary to expedite procedure and assist with technical details.  Ultrasound was used to evaluate the left common femoral vein which was widely patent and easily compressible.  It was cannulated under ultrasound guidance with a micropuncture needle.  A 018 wire was advanced without resistance, placement of micropuncture sheath.  Next, a 035 wire was inserted followed by the TCAR cannula.  Attention was then turned towards the right neck.  A transverse supraclavicular incision was made over top of the carotid artery.  Cautery was used divide subcutaneous tissue and platysma muscle.  Identified the 2 heads of the sternocleidomastoid and developed an avascular plane.  Dissection was  then carried down to the carotid artery which is approximately 3 cm deep within the wound.  The vagus nerve was visualized and protected.  The common carotid artery was disease-free.  It was mobilized throughout the width of the incision.  It was encircled with an umbilical tape and Tessalon.  Next, the patient was fully heparinized.  I then cannulated the common carotid artery with a micropuncture needle.  The 018 wire was inserted up to the mark on the wire and a micropuncture sheath was placed.  A contrast injection was then performed to locate the carotid bifurcation.  A carotid Amplatz wire was then inserted followed by the TCAR sheath.  Passive flow reversal was initiated and confirmed with a saline flush.  Multiple projections were performed to identify the best working angle.  The carotid artery was approximately 99% occluded.  Once we had a good working angle, a TCAR timeout was performed.  ACT was 267 and hemodynamics were appropriate.  Active flow reversal was initiated by securing the vessel loop.  This was confirmed with a saline flush.  I then used the 1 8 wire to navigate this into the internal carotid artery.  Initially this went into a collateral.  It was difficult to confirm that the wire was in the internal carotid however ultimately this was achieved.  The lesion was predilated with a 5 x 30 balloon and then stented with a 9 x 40 ENROUTE stent.  There appeared to be a residual waist and so I postdilated this with a 6 x 30 balloon.  Completion imaging revealed resolution of the stenosis with less than 10% stenosis remaining.  Wire was then removed  and flow reversal was discontinued.  Hand-held Doppler was used to evaluate the common carotid artery which had excellent signals.  50 mg of protamine was administered.  The sheath in the left groin was removed and manual pressure was held for hemostasis.  Surgi-Flo was placed into the wound.  Once hemostasis was satisfactory, the wound was irrigated.   The platysma muscle was reapproximated with 3-0 Vicryl and skin was closed with 3-0 Vicryl followed by Dermabond.  The patient was successfully extubated taken recovery room in stable condition.  He is neurologically intact.   Disposition: To PACU stable.   Theotis Burrow, M.D., [facility] [employer] Office: [phone] Pager:  [phone]

## 2020-12-01 NOTE — H&P (View-Only) (Signed)
Status post right-sided TCAR: Patient is resting comfortably in the PACU. He responds appropriately to questions.  He is moving all 4 extremities to command.  He does not have any visual disturbances.  Once he clears anesthesia protocol, he can be taken to 4 E.  [name]

## 2020-12-01 NOTE — Transfer of Care (Signed)
Immediate Anesthesia Transfer of Care Note  Patient: [name]  Procedure(s) Performed: RIGHT TRANSCAROTID ARTERY REVASCULARIZATION (Right: Neck) ULTRASOUND GUIDANCE FOR VASCULAR ACCESS (Left: Groin)  Patient Location: PACU  Anesthesia Type:General  Level of Consciousness: awake, alert , oriented and patient cooperative  Airway & Oxygen Therapy: Patient Spontanous Breathing and Patient connected to face mask oxygen  Post-op Assessment: Report given to RN, Post -op Vital signs reviewed and stable and Patient moving all extremities  Post vital signs: Reviewed and stable  Last Vitals:  Vitals Value Taken Time  BP    Temp    Pulse 59 12/01/20 0953  Resp 15 12/01/20 0953  SpO2 96 % 12/01/20 0953  Vitals shown include unvalidated device data.  Last Pain:  Vitals:   12/01/20 0627  TempSrc:   PainSc: 5       Patients Stated Pain Goal: 3 ([id number])  Complications: No notable events documented.

## 2020-12-01 NOTE — Anesthesia Procedure Notes (Signed)
Arterial Line Insertion Start/End8/02/2021 6:50 AM, 12/01/2020 7:00 AM Performed by: Catalina Gravel, MD, anesthesiologist  Patient location: Pre-op. Preanesthetic checklist: patient identified, IV checked, site marked, risks and benefits discussed, surgical consent, monitors and equipment checked, pre-op evaluation, timeout performed and anesthesia consent Lidocaine 1% used for infiltration Right, brachial was placed Catheter size: 20 G Hand hygiene performed  and maximum sterile barriers used   Attempts: 1 Procedure performed using ultrasound guided technique. Ultrasound Notes:anatomy identified, needle tip was noted to be adjacent to the nerve/plexus identified, no ultrasound evidence of intravascular and/or intraneural injection and image(s) printed for medical record Following insertion, dressing applied and Biopatch. Post procedure assessment: normal and unchanged  Patient tolerated the procedure well with no immediate complications.

## 2020-12-01 NOTE — Plan of Care (Signed)

## 2020-12-01 NOTE — Progress Notes (Signed)
Hyperglycemic Event  CBG: 248 on arrival  Treatment: 5 units Novolog SQ, per verbal order from Dr. Gifford Shave  Follow-up CBG: Time: 07:15 CBG Result: 227  Comments/MD notified: Dr. Dorthula Perfect, RN

## 2020-12-01 NOTE — Anesthesia Procedure Notes (Signed)
Procedure Name: Intubation Date/Time: 12/01/2020 7:45 AM Performed by: Myna Bright, CRNA Pre-anesthesia Checklist: Patient identified, Emergency Drugs available, Suction available and Patient being monitored Patient Re-evaluated:Patient Re-evaluated prior to induction Oxygen Delivery Method: Circle system utilized Preoxygenation: Pre-oxygenation with 100% oxygen Induction Type: IV induction Ventilation: Mask ventilation with difficulty, Two handed mask ventilation required and Oral airway inserted - appropriate to patient size Laryngoscope Size: Mac and 4 Grade View: Grade I Tube type: Oral Tube size: 7.5 mm Number of attempts: 1 Airway Equipment and Method: Stylet and Oral airway Placement Confirmation: ETT inserted through vocal cords under direct vision, positive ETCO2 and breath sounds checked- equal and bilateral Secured at: 24 cm Tube secured with: Tape Dental Injury: Teeth and Oropharynx as per pre-operative assessment  Comments: Intubated by Everlene Other SRNA

## 2020-12-02 ENCOUNTER — Encounter (HOSPITAL_COMMUNITY): Payer: Self-pay | Admitting: Surgery

## 2020-12-02 DIAGNOSIS — Z20822 Contact with and (suspected) exposure to covid-19: Secondary | ICD-10-CM | POA: Diagnosis not present

## 2020-12-02 DIAGNOSIS — Z885 Allergy status to narcotic agent status: Secondary | ICD-10-CM | POA: Diagnosis not present

## 2020-12-02 DIAGNOSIS — Z7984 Long term (current) use of oral hypoglycemic drugs: Secondary | ICD-10-CM | POA: Diagnosis not present

## 2020-12-02 DIAGNOSIS — Z8249 Family history of ischemic heart disease and other diseases of the circulatory system: Secondary | ICD-10-CM | POA: Diagnosis not present

## 2020-12-02 DIAGNOSIS — Z833 Family history of diabetes mellitus: Secondary | ICD-10-CM | POA: Diagnosis not present

## 2020-12-02 DIAGNOSIS — E785 Hyperlipidemia, unspecified: Secondary | ICD-10-CM | POA: Diagnosis not present

## 2020-12-02 DIAGNOSIS — R519 Headache, unspecified: Secondary | ICD-10-CM | POA: Diagnosis not present

## 2020-12-02 DIAGNOSIS — E1151 Type 2 diabetes mellitus with diabetic peripheral angiopathy without gangrene: Secondary | ICD-10-CM | POA: Diagnosis not present

## 2020-12-02 DIAGNOSIS — Z79899 Other long term (current) drug therapy: Secondary | ICD-10-CM | POA: Diagnosis not present

## 2020-12-02 DIAGNOSIS — I5022 Chronic systolic (congestive) heart failure: Secondary | ICD-10-CM | POA: Diagnosis not present

## 2020-12-02 DIAGNOSIS — Z955 Presence of coronary angioplasty implant and graft: Secondary | ICD-10-CM | POA: Diagnosis not present

## 2020-12-02 DIAGNOSIS — F419 Anxiety disorder, unspecified: Secondary | ICD-10-CM | POA: Diagnosis present

## 2020-12-02 DIAGNOSIS — Z87891 Personal history of nicotine dependence: Secondary | ICD-10-CM | POA: Diagnosis not present

## 2020-12-02 DIAGNOSIS — Z8719 Personal history of other diseases of the digestive system: Secondary | ICD-10-CM | POA: Diagnosis not present

## 2020-12-02 DIAGNOSIS — I2510 Atherosclerotic heart disease of native coronary artery without angina pectoris: Secondary | ICD-10-CM | POA: Diagnosis not present

## 2020-12-02 DIAGNOSIS — I110 Hypertensive heart disease with heart failure: Secondary | ICD-10-CM | POA: Diagnosis not present

## 2020-12-02 DIAGNOSIS — I252 Old myocardial infarction: Secondary | ICD-10-CM | POA: Diagnosis not present

## 2020-12-02 DIAGNOSIS — I6529 Occlusion and stenosis of unspecified carotid artery: Secondary | ICD-10-CM | POA: Diagnosis present

## 2020-12-02 DIAGNOSIS — Z83438 Family history of other disorder of lipoprotein metabolism and other lipidemia: Secondary | ICD-10-CM | POA: Diagnosis not present

## 2020-12-02 DIAGNOSIS — I6521 Occlusion and stenosis of right carotid artery: Secondary | ICD-10-CM | POA: Diagnosis not present

## 2020-12-02 DIAGNOSIS — Z888 Allergy status to other drugs, medicaments and biological substances status: Secondary | ICD-10-CM | POA: Diagnosis not present

## 2020-12-02 DIAGNOSIS — Z95810 Presence of automatic (implantable) cardiac defibrillator: Secondary | ICD-10-CM | POA: Diagnosis not present

## 2020-12-02 DIAGNOSIS — Z841 Family history of disorders of kidney and ureter: Secondary | ICD-10-CM | POA: Diagnosis not present

## 2020-12-02 DIAGNOSIS — Z7901 Long term (current) use of anticoagulants: Secondary | ICD-10-CM | POA: Diagnosis not present

## 2020-12-02 DIAGNOSIS — F410 Panic disorder [episodic paroxysmal anxiety] without agoraphobia: Secondary | ICD-10-CM | POA: Diagnosis not present

## 2020-12-02 LAB — GLUCOSE, CAPILLARY: Glucose-Capillary: 197 mg/dL — ABNORMAL HIGH (ref 70–99)

## 2020-12-02 LAB — LIPID PANEL
Cholesterol: 146 mg/dL (ref 0–200)
HDL: 31 mg/dL — ABNORMAL LOW (ref >40)
LDL Cholesterol: 77 mg/dL (ref 0–99)
Total CHOL/HDL Ratio: 4.7 RATIO
Triglycerides: 192 mg/dL — ABNORMAL HIGH (ref <150)
VLDL: 38 mg/dL (ref 0–40)

## 2020-12-02 LAB — CBC
HCT: 44.4 % (ref 39.0–52.0)
Hemoglobin: 14.4 g/dL (ref 13.0–17.0)
MCH: 31.4 pg (ref 26.0–34.0)
MCHC: 32.4 g/dL (ref 30.0–36.0)
MCV: 96.9 fL (ref 80.0–100.0)
Platelets: 210 10*3/uL (ref 150–400)
RBC: 4.58 MIL/uL (ref 4.22–5.81)
RDW: 15.8 % — ABNORMAL HIGH (ref 11.5–15.5)
WBC: 11.9 10*3/uL — ABNORMAL HIGH (ref 4.0–10.5)
nRBC: 0 % (ref 0.0–0.2)

## 2020-12-02 LAB — BASIC METABOLIC PANEL
Anion gap: 10 (ref 5–15)
BUN: 24 mg/dL — ABNORMAL HIGH (ref 6–20)
CO2: 23 mmol/L (ref 22–32)
Calcium: 8.7 mg/dL — ABNORMAL LOW (ref 8.9–10.3)
Chloride: 103 mmol/L (ref 98–111)
Creatinine, Ser: 1.07 mg/dL (ref 0.61–1.24)
GFR, Estimated: >60 mL/min (ref >60)
Glucose, Bld: 194 mg/dL — ABNORMAL HIGH (ref 70–99)
Potassium: 4.2 mmol/L (ref 3.5–5.1)
Sodium: 136 mmol/L (ref 135–145)

## 2020-12-02 MED ORDER — INSULIN GLARGINE-YFGN 100 UNIT/ML ~~LOC~~ SOLN
30.0000 [IU] | Freq: Every day | SUBCUTANEOUS | Status: DC
  Administered 2020-12-02: 30 [IU] via SUBCUTANEOUS
  Filled 2020-12-02: qty 0.3

## 2020-12-02 MED ORDER — EZETIMIBE 10 MG PO TABS
10.0000 mg | ORAL_TABLET | Freq: Every day | ORAL | Status: DC
  Administered 2020-12-02: 10 mg via ORAL
  Filled 2020-12-02: qty 1

## 2020-12-02 MED ORDER — TRAMADOL HCL 50 MG PO TABS: 50.0000 mg | ORAL_TABLET | Freq: Four times a day (QID) | ORAL | 0 refills | Status: DC | PRN

## 2020-12-02 MED ORDER — EZETIMIBE 10 MG PO TABS: 10.0000 mg | ORAL_TABLET | Freq: Every day | ORAL | 2 refills | Status: DC

## 2020-12-02 NOTE — Progress Notes (Signed)
Progress Note    12/02/2020 7:42 AM 1 Day Post-Op  Subjective:  Says he feels great. Headache nearly resolved>>describes it as "dull" this morning. No vision disturbances. Tolerating diet. No N, V, CP or SOB   Vitals:   12/02/20 0100 12/02/20 0400  BP: 123/72 122/62  Pulse: (!) 56 (!) 58  Resp: 13 18  Temp: 97.8 F (36.6 C) 97.8 F (36.6 C)  SpO2: 98% 99%    Physical Exam: General appearance: Awake, alert in no apparent distress Neurologic: Alert and oriented x4, tongue midline, face symmetric, grip strength 5/5 bilaterally Cardiac: Heart rate and rhythm are regular Respirations: Nonlabored Incision: Well approximated without bleeding or hematoma.  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 No sign of acute or subacute insult. No other abnormal brain parenchymal finding.   CBC    Component Value Date/Time   WBC 11.9 (H) 12/02/2020 0443   RBC 4.58 12/02/2020 0443   HGB 14.4 12/02/2020 0443   HGB 15.6 10/19/2020 1031   HCT 44.4 12/02/2020 0443   HCT 47.6 10/19/2020 1031   PLT 210 12/02/2020 0443   PLT 242 10/19/2020 1031   MCV 96.9 12/02/2020 0443   MCV 92 10/19/2020 1031   MCH 31.4 12/02/2020 0443   MCHC 32.4 12/02/2020 0443   RDW 15.8 (H) 12/02/2020 0443   RDW 17.1 (H) 10/19/2020 1031   LYMPHSABS 1.7 10/19/2020 1031   MONOABS 1.0 06/03/2020 2200   EOSABS 0.2 10/19/2020 1031   BASOSABS 0.1 10/19/2020 1031    BMET    Component Value Date/Time   NA 136 12/02/2020 0443   NA 137 10/19/2020 1031   K 4.2 12/02/2020 0443   CL 103 12/02/2020 0443   CO2 23 12/02/2020 0443   GLUCOSE 194 (H) 12/02/2020 0443   BUN 24 (H) 12/02/2020 0443   BUN 28 (H) 10/19/2020 1031   CREATININE 1.07 12/02/2020 0443   CALCIUM 8.7 (L) 12/02/2020 0443   GFRNONAA >60 12/02/2020 0443   GFRAA >60 12/09/2019 [id number]      Intake/Output Summary (Last 24 hours) at 12/02/2020 0742 Last data filed at 12/02/2020 0557 Gross per 24 hour  Intake 1100 ml  Output 2100 ml  Net -1000 ml    HOSPITAL MEDICATIONS Scheduled Meds:  aspirin EC  81 mg Oral Q0600   atorvastatin  40 mg Oral Daily   carvedilol  25 mg Oral BID   Chlorhexidine Gluconate Cloth  6 each Topical Q0600   clopidogrel  75 mg Oral Daily   docusate sodium  100 mg Oral Daily   furosemide  40 mg Oral Daily   heparin  5,000 Units Subcutaneous Q8H   insulin aspart  0-15 Units Subcutaneous TID WC   mupirocin ointment  1 application Nasal BID   pantoprazole  40 mg Oral Daily   sacubitril-valsartan  1 tablet Oral BID   spironolactone  25 mg Oral Daily   Continuous Infusions:  sodium chloride     sodium chloride Stopped (12/02/20 0559)   magnesium sulfate bolus IVPB     PRN Meds:.sodium chloride, acetaminophen **OR** acetaminophen, alum & mag hydroxide-simeth, guaiFENesin-dextromethorphan, hydrALAZINE, labetalol, magnesium sulfate bolus IVPB, metoprolol tartrate, morphine injection, phenol, potassium chloride, senna-docusate, traMADol  Assessment and Plan: POD 1 right TCAR. Developed significant right sided headache post-operatively and has remained neurologically intact. CT head without acute process. Hemodynamically stable.  Stable for discharge home.  -  DVT prophylaxis:  heparin [place]   Risa Grill, PA-C [facility] [phone] 12/02/2020  7:42 AM

## 2020-12-02 NOTE — Plan of Care (Signed)
  Problem: Education: Goal: Knowledge of General Education information will improve Description: Including pain rating scale, medication(s)/side effects and non-pharmacologic comfort measures Outcome: Adequate for Discharge   

## 2020-12-02 NOTE — Progress Notes (Signed)
PHARMACIST LIPID MONITORING   [name] is a 60 y.o. male admitted on 12/01/2020 with R carotid stenosis s/p TCAR.  Pharmacy has been consulted to optimize lipid-lowering therapy with the indication of secondary prevention for clinical ASCVD.  Recent Labs:  Lipid Panel (last 6 months):   Lab Results  Component Value Date   CHOL 146 12/02/2020   TRIG 192 (H) 12/02/2020   HDL 31 (L) 12/02/2020   CHOLHDL 4.7 12/02/2020   VLDL 38 12/02/2020   LDLCALC 77 12/02/2020    Hepatic function panel (last 6 months):   Lab Results  Component Value Date   AST 20 12/01/2020   ALT 23 12/01/2020   ALKPHOS 66 12/01/2020   BILITOT 0.5 12/01/2020    SCr (since admission):   Serum creatinine: 1.07 mg/dL 12/02/20 0443 Estimated creatinine clearance: 83.6 mL/min  Current therapy and lipid therapy tolerance Current lipid-lowering therapy: atorvastatin '40mg'$  Previous lipid-lowering therapies (if applicable): simvastatin Documented or reported allergies or intolerances to lipid-lowering therapies (if applicable): simvastatin caused joint pain  Assessment:   Patient states simvastatin caused debilitating hip joint pain but this has not happened with atorvastatin '40mg'$ . Patient has never tried '80mg'$  and given the option to increase the dose versus adding ezetimibe, patient prefers trying ezetimibe. Discussed possible side effects and to notify cardiologist if occurs. Patient agrees with changes to lipid-lowering therapy  Plan:    1.Statin intensity (high intensity recommended for all patients regardless of the LDL):  Statin intolerance noted. No statin changes due to serious side effects (ex. Myalgias with at least 2 different statins).  2.Add ezetimibe (if any one of the following):   On a high intensity statin with LDL > 70.  3.Refer to lipid clinic:   No  4.Follow-up with:  Cardiology provider - Kirk Ruths, MD  5.Follow-up labs after discharge:  Changes in lipid therapy were made. Check  a lipid panel in 8-12 weeks then annually.      Benetta Spar, PharmD, BCPS, BCCP Clinical Pharmacist  Please check AMION for all [facility] phone numbers After 10:00 PM, call [place] [phone]

## 2020-12-03 NOTE — Interval H&P Note (Signed)
History and Physical Interval Note:  12/03/2020 9:52 PM  [name]  has presented today for surgery, with the diagnosis of Right Carotid Artery Stenosis.  The various methods of treatment have been discussed with the patient and family. After consideration of risks, benefits and other options for treatment, the patient has consented to  Procedure(s): RIGHT TRANSCAROTID ARTERY REVASCULARIZATION (Right) ULTRASOUND GUIDANCE FOR VASCULAR ACCESS (Left) as a surgical intervention.  The patient's history has been reviewed, patient examined, no change in status, stable for surgery.  I have reviewed the patient's chart and labs.  Questions were answered to the patient's satisfaction.     [name]

## 2020-12-03 NOTE — Interval H&P Note (Signed)
History and Physical Interval Note:  12/03/2020 9:53 PM  [name]  has presented today for surgery, with the diagnosis of Right Carotid Artery Stenosis.  The various methods of treatment have been discussed with the patient and family. After consideration of risks, benefits and other options for treatment, the patient has consented to  Procedure(s): RIGHT TRANSCAROTID ARTERY REVASCULARIZATION (Right) ULTRASOUND GUIDANCE FOR VASCULAR ACCESS (Left) as a surgical intervention.  The patient's history has been reviewed, patient examined, no change in status, stable for surgery.  I have reviewed the patient's chart and labs.  Questions were answered to the patient's satisfaction.     [name]

## 2020-12-04 NOTE — Discharge Summary (Signed)
Carotid Discharge Summary     [name] 07-03-1960 60 y.o. male  [id number]  Admission Date: 12/01/2020  Discharge Date: 12/02/2020  Physician: No att. providers found  Admission Diagnosis: Carotid stenosis, right [I65.21] Carotid stenosis, symptomatic w/o infarct, right [I65.21] Carotid stenosis [I65.29]  Discharge Day services:      Hospital Course:  The patient was admitted to the hospital and taken to the operating room on 12/01/2020 and underwent right transcarotid artery revascularization (stent).  The pt tolerated the procedure well and was transported to the PACU in good condition. He complained of significant right sided headache and Ct scan of brain was obtained and was without acute findings.   By POD 1, the pt neuro status intact. Headache nearly completely resolved. Described as dull. No vision disturbances, N or V. Incision without bleeding or hematoma. Hemodynamically stable.  The remainder of the hospital course consisted of increasing mobilization and increasing intake of solids without difficulty.   Recent Labs    12/02/20 0443  NA 136  K 4.2  CL 103  CO2 23  GLUCOSE 194*  BUN 24*  CALCIUM 8.7*   Recent Labs    12/02/20 0443  WBC 11.9*  HGB 14.4  HCT 44.4  PLT 210   No results for input(s): INR in the last 72 hours.   Discharge Instructions     Discharge patient   Complete by: As directed    Discharge disposition: 01-Home or Self Care   Discharge patient date: 12/02/2020       Discharge Diagnosis:  Carotid stenosis, right [I65.21] Carotid stenosis, symptomatic w/o infarct, right [I65.21] Carotid stenosis [I65.29]  Secondary Diagnosis: Patient Active Problem List   Diagnosis Date Noted   Carotid stenosis 12/02/2020   Carotid stenosis, right 12/01/2020   Carotid stenosis, symptomatic w/o infarct, right 12/01/2020   Acute on chronic HFrEF (heart failure with reduced ejection fraction) ([place]) 06/04/2020   Essential  hypertension 06/04/2020   Diabetes mellitus type 2 in nonobese ([place]) 06/04/2020   Sternal wound dehiscence 11/26/2019   Sternal wound infection 11/26/2019   Wound of sternal region 11/26/2019   Hx of CABG 11/23/2019   Ischemic cardiomyopathy 11/23/2019   PAF (paroxysmal atrial fibrillation) ([place]) 11/23/2019   PVD (peripheral vascular disease) ([place]) 11/23/2019   Chronic back pain 11/23/2019   Prolonged Q-T interval on ECG 11/23/2019   Stenosis of right carotid artery    Pleural effusion 10/26/2019   Acute combined systolic and diastolic heart failure ([place]) 10/25/2019   Acute congestive heart failure ([place])    Superficial bacterial skin infection 06/16/2018   Bacterial skin infection 06/16/2018   CAD S/P percutaneous coronary angioplasty 08/29/2011   Tobacco abuse 08/29/2011   Hypertension 08/29/2011   Hyperlipidemia 08/29/2011   Diabetes mellitus ([place]) 08/29/2011   Past Medical History:  Diagnosis Date   AICD (automatic cardioverter/defibrillator) present    Anginal pain ([place])    Anxiety    CAD (coronary artery disease)    CHF (congestive heart failure) ([facility])    Chronic back pain    Colon polyps    Diabetes mellitus    Heart attack ([place])    Hyperlipidemia    Hypertension    Neuropathy    Panic attacks    PVD (peripheral vascular disease) ([place])    Sleep apnea     Allergies as of 12/02/2020       Reactions   Codeine Shortness Of Breath, Itching   Hydrocodone Shortness Of Breath, Nausea Only, Rash   Breathing  problems ALSO   Gabapentin Other (See Comments)   Suicidal thoughts, extreme dreams   Pregabalin Other (See Comments)   Suicidal thoughts, extreme dreams    Simvastatin Other (See Comments)   Pain in joints   Lisinopril Itching, Rash   Venlafaxine Rash        Medication List     TAKE these medications    apixaban 5 MG Tabs tablet Commonly known as: ELIQUIS Take 1 tablet (5 mg total) by mouth 2 (two) times daily.   atorvastatin 40 MG tablet Commonly  known as: LIPITOR Take 1 tablet (40 mg total) by mouth daily.   carvedilol 25 MG tablet Commonly known as: COREG Take 1 tablet (25 mg total) by mouth 2 (two) times daily.   clopidogrel 75 MG tablet Commonly known as: Plavix Take 1 tablet (75 mg total) by mouth daily.   Dulaglutide 1.5 MG/0.5ML Sopn Inject 1.5 mg into the skin every Wednesday.   empagliflozin 25 MG Tabs tablet Commonly known as: Jardiance Take 1 tablet (25 mg total) by mouth daily before breakfast.   ezetimibe 10 MG tablet Commonly known as: Zetia Take 1 tablet (10 mg total) by mouth daily.   furosemide 40 MG tablet Commonly known as: Lasix Take 1 tablet (40 mg total) by mouth daily. TAKE '80MG'$  X3D THEN BACK TO '40MG'$    metFORMIN 1000 MG tablet Commonly known as: GLUCOPHAGE Take 1,000 mg by mouth 2 (two) times daily.   OXYGEN Inhale 3 L/min into the lungs at bedtime as needed (for shortness of breath).   sacubitril-valsartan 97-103 MG Commonly known as: ENTRESTO Take 1 tablet by mouth 2 (two) times daily.   spironolactone 25 MG tablet Commonly known as: ALDACTONE Take 1 tablet (25 mg total) by mouth daily.   traMADol 50 MG tablet Commonly known as: ULTRAM Take 1 tablet (50 mg total) by mouth every 6 (six) hours as needed for moderate pain.   Tyler Aas FlexTouch 100 UNIT/ML FlexTouch Pen Generic drug: insulin degludec Inject 30 Units into the skin daily.         Discharge Instructions:   [facility] Discharge Instructions Carotid Endarterectomy (CEA)  Please refer to the following instructions for your post-procedure care. Your surgeon or physician assistant will discuss any changes with you.  Activity  You are encouraged to walk as much as you can. You can slowly return to normal activities but must avoid strenuous activity and heavy lifting until your doctor tell you it's OK. Avoid activities such as vacuuming or swinging a golf club. You can drive after one  week if you are comfortable and you are no longer taking prescription pain medications. It is normal to feel tired for serval weeks after your surgery. It is also normal to have difficulty with sleep habits, eating, and bowel movements after surgery. These will go away with time.  Bathing/Showering  You may shower after you come home. Do not soak in a bathtub, hot tub, or swim until the incision heals completely.  Incision Care  Shower every day. Clean your incision with mild soap and water. Pat the area dry with a clean towel. You do not need a bandage unless otherwise instructed. Do not apply any ointments or creams to your incision. You may have skin glue on your incision. Do not peel it off. It will come off on its own in about one week. Your incision may feel thickened and raised for several weeks after your surgery. This is normal and  the skin will soften over time. For Men Only: It's OK to shave around the incision but do not shave the incision itself for 2 weeks. It is common to have numbness under your chin that could last for several months.  Diet  Resume your normal diet. There are no special food restrictions following this procedure. A low fat/low cholesterol diet is recommended for all patients with vascular disease. In order to heal from your surgery, it is CRITICAL to get adequate nutrition. Your body requires vitamins, minerals, and protein. Vegetables are the best source of vitamins and minerals. Vegetables also provide the perfect balance of protein. Processed food has little nutritional value, so try to avoid this.  Medications  Resume taking all of your medications unless your doctor or physician assistant tells you not to.  If your incision is causing pain, you may take over-the- counter pain relievers such as acetaminophen (Tylenol). If you were prescribed a stronger pain medication, please be aware these medications can cause nausea and constipation.  Prevent nausea by taking  the medication with a snack or meal. Avoid constipation by drinking plenty of fluids and eating foods with a high amount of fiber, such as fruits, vegetables, and grains. Do not take Tylenol if you are taking prescription pain medications.  Follow Up  Our office will schedule a follow up appointment 2-3 weeks following discharge.  Please call us immediately for any of the following conditions  Increased pain, redness, drainage (pus) from your incision site. Fever of 101 degrees or higher. If you should develop stroke (slurred speech, difficulty swallowing, weakness on one side of your body, loss of vision) you should call 911 and go to the nearest emergency room.  Reduce your risk of vascular disease:  Stop smoking. If you would like help call [facility] at 1-800-QUIT-NOW 3302249298) or [place] at [phone]. Manage your cholesterol Maintain a desired weight Control your diabetes Keep your blood pressure down  If you have any questions, please call the office at [phone].  Prescriptions given: Declined narcotics #0 No Refill  Disposition: home  Patient's condition: is Excellent  Follow up: 1. Dr. Trula Slade in 2 weeks.   Barbie Banner PA-C [facility] [phone]   --- For [facility] Registry use ---   Modified Rankin score at D/C (0-6): 0  IV medication needed for:  1. Hypertension: No 2. Hypotension: No  Post-op Complications: No  1. Post-op CVA or TIA: No  If yes: Event classification (right eye, left eye, right cortical, left cortical, verterobasilar, other):   If yes: Timing of event (intra-op, <6 hrs post-op, >=6 hrs post-op, unknown):   2. CN injury: No  If yes: CN  injuried   3. Myocardial infarction: No  If yes: Dx by (EKG or clinical, Troponin):   4.  CHF: No  5.  Dysrhythmia (new): No  6. Wound infection: No  7. Reperfusion symptoms: Yes  8. Return to OR: No  If yes: return to OR for (bleeding, neurologic,  other CEA incision, other):   Discharge medications: Statin use:  Yes ASA use:  No   Beta blocker use:  Yes ACE-Inhibitor use:  No  ARB use:  Yes CCB use: No P2Y12 Antagonist use: Yes, [ x] Plavix, '[ ]'$  Plasugrel, '[ ]'$  Ticlopinine, '[ ]'$  Ticagrelor, '[ ]'$  Other, '[ ]'$  No for medical reason, '[ ]'$  Non-compliant, '[ ]'$  Not-indicated Anti-coagulant use:  Yes, '[ ]'$  Warfarin, '[ ]'$  Rivaroxaban, '[ ]'$  Dabigatran,  Eliquis

## 2020-12-05 ENCOUNTER — Encounter (HOSPITAL_COMMUNITY)
Admission: RE | Admit: 2020-12-05 | Discharge: 2020-12-05 | Disposition: A | Discharge status: Home / Self Care | Payer: Medicaid Other | Source: Ambulatory Visit | Attending: Surgery | Admitting: Surgery

## 2020-12-19 ENCOUNTER — Other Ambulatory Visit: Payer: Self-pay

## 2020-12-19 ENCOUNTER — Ambulatory Visit (INDEPENDENT_AMBULATORY_CARE_PROVIDER_SITE_OTHER): Payer: Medicaid Other | Admitting: Surgery

## 2020-12-19 ENCOUNTER — Encounter: Payer: Self-pay | Admitting: Surgery

## 2020-12-19 VITALS — BP 123/75 | HR 55 | Temp 97.0°F | Resp 18 | Ht 67.0 in | Wt 218.0 lb

## 2020-12-19 DIAGNOSIS — I6523 Occlusion and stenosis of bilateral carotid arteries: Secondary | ICD-10-CM

## 2020-12-19 MED ORDER — CLOPIDOGREL BISULFATE 75 MG PO TABS: 75.0000 mg | ORAL_TABLET | Freq: Every day | ORAL | 11 refills | Status: DC

## 2020-12-19 NOTE — H&P (View-Only) (Signed)
Patient name: [name] MRN: [id number] DOB: [birth year] Sex: male  REASON FOR VISIT:     Post op  HISTORY OF PRESENT ILLNESS:   [name] is a 60 y.o. male who initially presented with claudication in the right leg as well as a wound in 2016.  ABI on the right was 0.34 and normal on the left.  On 01/17/2015 he underwent angiography revealing a distal superficial femoral and proximal popliteal artery occlusion which was successfully recanalized and stented using overlapping 6 mm Cordis stents.  At the same time the right common iliac artery was treated using an 8 x 39 balloon expandable stent.  The ulcer healed.  He then presented in 2017 with recurrent symptoms of claudication at less than 50 feet.  He underwent angiography on 12/20/2015 and was found to have several areas of stenosis within the stent which were successfully treated with drug-coated balloon angioplasty.     He then presented with worsening claudication symptoms without rest pain or wounds.  At that time he was complaining of some vision narrowing and darkening that occurs once a month in his right eye.  His carotid duplex showed high-grade bilateral lesions.  He was sent for CT scan for further evaluation.  This showed bilateral 80% carotid stenosis.  On 12/01/2020, he underwent right sided TCAR without incident.  He is back today for follow.  He denies any neurologic symptoms.  He has no further pain although he did have headaches for 3 days postprocedure   Patient suffers from coronary artery disease and is status post CABG in 123XX123 which was complicated by sternal wound.  He suffers from ischemic cardiomyopathy with chronic systolic congestive heart failure on Entresto.  He had a ICD implanted in June 2022.  He takes a statin for hypercholesterolemia.  He is on Eliquis for atrial fibrillation.  He is a diabetic.  CURRENT MEDICATIONS:    Current Outpatient Medications  Medication  Sig Dispense Refill   clopidogrel (PLAVIX) 75 MG tablet Take 1 tablet (75 mg total) by mouth daily. 30 tablet 11   Dulaglutide 1.5 MG/0.5ML SOPN Inject 1.5 mg into the skin every Wednesday.     empagliflozin (JARDIANCE) 25 MG TABS tablet Take 1 tablet (25 mg total) by mouth daily before breakfast. 30 tablet    ezetimibe (ZETIA) 10 MG tablet Take 1 tablet (10 mg total) by mouth daily. 30 tablet 2   metFORMIN (GLUCOPHAGE) 1000 MG tablet Take 1,000 mg by mouth 2 (two) times daily.     OXYGEN Inhale 3 L/min into the lungs at bedtime as needed (for shortness of breath).     sacubitril-valsartan (ENTRESTO) 97-103 MG Take 1 tablet by mouth 2 (two) times daily. 180 tablet 3   TRESIBA FLEXTOUCH 100 UNIT/ML FlexTouch Pen Inject 30 Units into the skin daily.     apixaban (ELIQUIS) 5 MG TABS tablet Take 1 tablet (5 mg total) by mouth 2 (two) times daily. (Patient not taking: No sig reported) 180 tablet 3   atorvastatin (LIPITOR) 40 MG tablet Take 1 tablet (40 mg total) by mouth daily. 90 tablet 3   carvedilol (COREG) 25 MG tablet Take 1 tablet (25 mg total) by mouth 2 (two) times daily. 180 tablet 3   furosemide (LASIX) 40 MG tablet Take 1 tablet (40 mg total) by mouth daily. TAKE '80MG'$  X3D THEN BACK TO '40MG'$  93 tablet 3   spironolactone (ALDACTONE) 25 MG tablet Take 1 tablet (25 mg total) by mouth daily. [place]  tablet 3   traMADol (ULTRAM) 50 MG tablet Take 1 tablet (50 mg total) by mouth every 6 (six) hours as needed for moderate pain. (Patient not taking: Reported on 12/19/2020) 30 tablet 0   No current facility-administered medications for this visit.    REVIEW OF SYSTEMS:   '[X]'$  denotes positive finding, '[ ]'$  denotes negative finding Cardiac  Comments:  Chest pain or chest pressure:    Shortness of breath upon exertion:    Short of breath when lying flat:    Irregular heart rhythm:    Constitutional    Fever or chills:      PHYSICAL EXAM:   Vitals:   12/19/20 0814 12/19/20 0815  BP: 126/84 123/75   Pulse: (!) 56 (!) 55  Resp: 18   Temp: (!) 97 F (36.1 C)   TempSrc: Temporal   SpO2: 97%   Weight: 218 lb (98.9 kg)   Height: '5\' 7"'$  (1.702 m)     GENERAL: The patient is a well-nourished male, in no acute distress. The vital signs are documented above. CARDIOVASCULAR: There is a regular rate and rhythm. PULMONARY: Non-labored respirations Incision is healing nicely  STUDIES:   None   MEDICAL ISSUES:   We discussed proceeding with a left sided TCAR to treat his asymptomatic greater than 80% stenosis.  He is unsure of his medications today.  I have called him and another prescription for Plavix and told him that it is imperative that he takes aspirin Plavix and a statin.  He is on Eliquis, which will need to be discontinued prior to his operation which is been scheduled for September 9  [name], Dorothy Puffer, MD, [facility] [phone] Pager [phone]

## 2020-12-19 NOTE — Progress Notes (Signed)
Patient name: [name] MRN: [id number] DOB: 1960-08-11 Sex: male  REASON FOR VISIT:     Post op  HISTORY OF PRESENT ILLNESS:   [name] is a 60 y.o. male who initially presented with claudication in the right leg as well as a wound in 2016.  ABI on the right was 0.34 and normal on the left.  On 01/17/2015 he underwent angiography revealing a distal superficial femoral and proximal popliteal artery occlusion which was successfully recanalized and stented using overlapping 6 mm Cordis stents.  At the same time the right common iliac artery was treated using an 8 x 39 balloon expandable stent.  The ulcer healed.  He then presented in 2017 with recurrent symptoms of claudication at less than 50 feet.  He underwent angiography on 12/20/2015 and was found to have several areas of stenosis within the stent which were successfully treated with drug-coated balloon angioplasty.     He then presented with worsening claudication symptoms without rest pain or wounds.  At that time he was complaining of some vision narrowing and darkening that occurs once a month in his right eye.  His carotid duplex showed high-grade bilateral lesions.  He was sent for CT scan for further evaluation.  This showed bilateral 80% carotid stenosis.  On 12/01/2020, he underwent right sided TCAR without incident.  He is back today for follow.  He denies any neurologic symptoms.  He has no further pain although he did have headaches for 3 days postprocedure   Patient suffers from coronary artery disease and is status post CABG in [id number] which was complicated by sternal wound.  He suffers from ischemic cardiomyopathy with chronic systolic congestive heart failure on Entresto.  He had a ICD implanted in June 2022.  He takes a statin for hypercholesterolemia.  He is on Eliquis for atrial fibrillation.  He is a diabetic.  CURRENT MEDICATIONS:    Current Outpatient Medications  Medication  Sig Dispense Refill   clopidogrel (PLAVIX) 75 MG tablet Take 1 tablet (75 mg total) by mouth daily. 30 tablet 11   Dulaglutide 1.5 MG/0.5ML SOPN Inject 1.5 mg into the skin every Wednesday.     empagliflozin (JARDIANCE) 25 MG TABS tablet Take 1 tablet (25 mg total) by mouth daily before breakfast. 30 tablet    ezetimibe (ZETIA) 10 MG tablet Take 1 tablet (10 mg total) by mouth daily. 30 tablet 2   metFORMIN (GLUCOPHAGE) 1000 MG tablet Take 1,000 mg by mouth 2 (two) times daily.     OXYGEN Inhale 3 L/min into the lungs at bedtime as needed (for shortness of breath).     sacubitril-valsartan (ENTRESTO) 97-103 MG Take 1 tablet by mouth 2 (two) times daily. 180 tablet 3   TRESIBA FLEXTOUCH 100 UNIT/ML FlexTouch Pen Inject 30 Units into the skin daily.     apixaban (ELIQUIS) 5 MG TABS tablet Take 1 tablet (5 mg total) by mouth 2 (two) times daily. (Patient not taking: No sig reported) 180 tablet 3   atorvastatin (LIPITOR) 40 MG tablet Take 1 tablet (40 mg total) by mouth daily. 90 tablet 3   carvedilol (COREG) 25 MG tablet Take 1 tablet (25 mg total) by mouth 2 (two) times daily. 180 tablet 3   furosemide (LASIX) 40 MG tablet Take 1 tablet (40 mg total) by mouth daily. TAKE '80MG'$  X3D THEN BACK TO '40MG'$  93 tablet 3   spironolactone (ALDACTONE) 25 MG tablet Take 1 tablet (25 mg total) by mouth daily. [place]  tablet 3   traMADol (ULTRAM) 50 MG tablet Take 1 tablet (50 mg total) by mouth every 6 (six) hours as needed for moderate pain. (Patient not taking: Reported on 12/19/2020) 30 tablet 0   No current facility-administered medications for this visit.    REVIEW OF SYSTEMS:   '[X]'$  denotes positive finding, '[ ]'$  denotes negative finding Cardiac  Comments:  Chest pain or chest pressure:    Shortness of breath upon exertion:    Short of breath when lying flat:    Irregular heart rhythm:    Constitutional    Fever or chills:      PHYSICAL EXAM:   Vitals:   12/19/20 0814 12/19/20 0815  BP: 126/84 123/75   Pulse: (!) 56 (!) 55  Resp: 18   Temp: (!) 97 F (36.1 C)   TempSrc: Temporal   SpO2: 97%   Weight: 218 lb (98.9 kg)   Height: '5\' 7"'$  (1.702 m)     GENERAL: The patient is a well-nourished male, in no acute distress. The vital signs are documented above. CARDIOVASCULAR: There is a regular rate and rhythm. PULMONARY: Non-labored respirations Incision is healing nicely  STUDIES:   None   MEDICAL ISSUES:   We discussed proceeding with a left sided TCAR to treat his asymptomatic greater than 80% stenosis.  He is unsure of his medications today.  I have called him and another prescription for Plavix and told him that it is imperative that he takes aspirin Plavix and a statin.  He is on Eliquis, which will need to be discontinued prior to his operation which is been scheduled for September 9  [name], Dorothy Puffer, MD, [facility] of [facility] [phone] Pager [phone]

## 2020-12-23 NOTE — Pre-Procedure Instructions (Signed)
Surgical Instructions    Your procedure is scheduled on Friday 12/30/20.   Report to [facility] Main Entrance "A" at 05:30 A.M., then check in with the Admitting office.  Call this number if you have problems the morning of surgery:  [phone]   If you have any questions prior to your surgery date call [phone]: Open Monday-Friday 8am-4pm    Remember:  Do not eat or drink after midnight the night before your surgery     Take these medicines the morning of surgery with A SIP OF WATER   atorvastatin (LIPITOR)  carvedilol (COREG)   ezetimibe (ZETIA)   Please follow your surgeon's instructions regarding apixaban (ELIQUIS) and clopidogrel (PLAVIX). If you have not received instructions then you need to contact your surgeon's office.   As of today, STOP taking any Aspirin (unless otherwise instructed by your surgeon) Aleve, Naproxen, Ibuprofen, Motrin, Advil, Goody's, BC's, all herbal medications, fish oil, and all vitamins.  WHAT DO I DO ABOUT MY DIABETES MEDICATION?   Do not take oral diabetes medicines (pills) the morning of surgery.        Do not take empagliflozin (JARDIANCE) the day before surgery 12/29/20 or the morning of surgery 12/30/20.       Do not take metFORMIN (GLUCOPHAGE) the morning of surgery 12/30/20.      THE MORNING OF SURGERY, take 15 units of TRESIBA FLEXTOUCH 100 UNIT/ML FlexTouch Pen.  The day of surgery, do not take other diabetes injectables, including Byetta (exenatide), Bydureon (exenatide ER), Victoza (liraglutide), or Trulicity (dulaglutide).  If your CBG is greater than 220 mg/dL, you may take  of your sliding scale (correction) dose of insulin.   HOW TO MANAGE YOUR DIABETES BEFORE AND AFTER SURGERY  Why is it important to control my blood sugar before and after surgery? Improving blood sugar levels before and after surgery helps healing and can limit problems. A way of improving blood sugar control is eating a healthy diet by:  Eating less  sugar and carbohydrates  Increasing activity/exercise  Talking with your doctor about reaching your blood sugar goals High blood sugars (greater than 180 mg/dL) can raise your risk of infections and slow your recovery, so you will need to focus on controlling your diabetes during the weeks before surgery. Make sure that the doctor who takes care of your diabetes knows about your planned surgery including the date and location.  How do I manage my blood sugar before surgery? Check your blood sugar at least 4 times a day, starting 2 days before surgery, to make sure that the level is not too high or low.  Check your blood sugar the morning of your surgery when you wake up and every 2 hours until you get to the Short Stay unit.  If your blood sugar is less than 70 mg/dL, you will need to treat for low blood sugar: Do not take insulin. Treat a low blood sugar (less than 70 mg/dL) with  cup of clear juice (cranberry or apple), 4 glucose tablets, OR glucose gel. Recheck blood sugar in 15 minutes after treatment (to make sure it is greater than 70 mg/dL). If your blood sugar is not greater than 70 mg/dL on recheck, call [phone] for further instructions. Report your blood sugar to the short stay nurse when you get to Short Stay.  If you are admitted to the hospital after surgery: Your blood sugar will be checked by the staff and you will probably be given insulin after surgery (instead  of oral diabetes medicines) to make sure you have good blood sugar levels. The goal for blood sugar control after surgery is 80-180 mg/dL.           Do not wear jewelry or makeup Do not wear lotions, powders, perfumes/colognes, or deodorant. Do not shave 48 hours prior to surgery.  Men may shave face and neck. Do not bring valuables to the hospital. DO Not wear nail polish, gel polish, artificial nails, or any other type of covering on natural nails including finger and toenails. If patients have artificial  nails, gel coating, etc. that need to be removed by a nail salon please have this removed prior to surgery or surgery may need to be canceled/delayed if the surgeon/ anesthesia feels like the patient is unable to be adequately monitored.             [facility] is not responsible for any belongings or valuables.  Do NOT Smoke (Tobacco/Vaping)  24 hours prior to your procedure If you use a CPAP at night, you may bring all equipment for your overnight stay.   Contacts, glasses, dentures or bridgework may not be worn into surgery, please bring cases for these belongings   For patients admitted to the hospital, discharge time will be determined by your treatment team.   Patients discharged the day of surgery will not be allowed to drive home, and someone needs to stay with them for 24 hours.  ONLY 1 SUPPORT PERSON MAY BE PRESENT WHILE YOU ARE IN SURGERY. IF YOU ARE TO BE ADMITTED ONCE YOU ARE IN YOUR ROOM YOU WILL BE ALLOWED TWO (2) VISITORS.  Minor children may have two parents present. Special consideration for safety and communication needs will be reviewed on a case by case basis.  Special instructions:    Oral Hygiene is also important to reduce your risk of infection.  Remember - BRUSH YOUR TEETH THE MORNING OF SURGERY WITH YOUR REGULAR TOOTHPASTE   [place]- Preparing For Surgery  Before surgery, you can play an important role. Because skin is not sterile, your skin needs to be as free of germs as possible. You can reduce the number of germs on your skin by washing with CHG (chlorahexidine gluconate) Soap before surgery.  CHG is an antiseptic cleaner which kills germs and bonds with the skin to continue killing germs even after washing.     Please do not use if you have an allergy to CHG or antibacterial soaps. If your skin becomes reddened/irritated stop using the CHG.  Do not shave (including legs and underarms) for at least 48 hours prior to first CHG shower. It is OK to shave your  face.  Please follow these instructions carefully.     Shower the NIGHT BEFORE SURGERY and the MORNING OF SURGERY with CHG Soap.   If you chose to wash your hair, wash your hair first as usual with your normal shampoo. After you shampoo, rinse your hair and body thoroughly to remove the shampoo.  Then [employer] and genitals (private parts) with your normal soap and rinse thoroughly to remove soap.  After that Use CHG Soap as you would any other liquid soap. You can apply CHG directly to the skin and wash gently with a scrungie or a clean washcloth.   Apply the CHG Soap to your body ONLY FROM THE NECK DOWN.  Do not use on open wounds or open sores. Avoid contact with your eyes, ears, mouth and genitals (private parts).  Wash Face and genitals (private parts)  with your normal soap.   Wash thoroughly, paying special attention to the area where your surgery will be performed.  Thoroughly rinse your body with warm water from the neck down.  DO NOT shower/wash with your normal soap after using and rinsing off the CHG Soap.  Pat yourself dry with a CLEAN TOWEL.  Wear CLEAN PAJAMAS to bed the night before surgery  Place CLEAN SHEETS on your bed the night before your surgery  DO NOT SLEEP WITH PETS.   Day of Surgery:  Take a shower with CHG soap. Wear Clean/Comfortable clothing the morning of surgery Do not apply any deodorants/lotions.   Remember to brush your teeth WITH YOUR REGULAR TOOTHPASTE.   Please read over the following fact sheets that you were given.

## 2020-12-23 NOTE — Progress Notes (Signed)
Device orders requested. [employer] sent to [facility] 12/24/20 at 0854.

## 2020-12-27 ENCOUNTER — Encounter (HOSPITAL_COMMUNITY): Payer: Self-pay

## 2020-12-27 ENCOUNTER — Other Ambulatory Visit: Payer: Self-pay

## 2020-12-27 ENCOUNTER — Encounter (HOSPITAL_COMMUNITY)
Admission: RE | Admit: 2020-12-27 | Discharge: 2020-12-27 | Disposition: A | Discharge status: Home / Self Care | Payer: Medicaid Other | Source: Ambulatory Visit | Attending: Surgery | Admitting: Surgery

## 2020-12-27 DIAGNOSIS — Z20822 Contact with and (suspected) exposure to covid-19: Secondary | ICD-10-CM | POA: Diagnosis not present

## 2020-12-27 DIAGNOSIS — Z01812 Encounter for preprocedural laboratory examination: Secondary | ICD-10-CM | POA: Diagnosis not present

## 2020-12-27 LAB — CBC
HCT: 51.6 % (ref 39.0–52.0)
Hemoglobin: 17.1 g/dL — ABNORMAL HIGH (ref 13.0–17.0)
MCH: 31.3 pg (ref 26.0–34.0)
MCHC: 33.1 g/dL (ref 30.0–36.0)
MCV: 94.3 fL (ref 80.0–100.0)
Platelets: 244 10*3/uL (ref 150–400)
RBC: 5.47 MIL/uL (ref 4.22–5.81)
RDW: 14.3 % (ref 11.5–15.5)
WBC: 10.6 10*3/uL — ABNORMAL HIGH (ref 4.0–10.5)
nRBC: 0 % (ref 0.0–0.2)

## 2020-12-27 LAB — COMPREHENSIVE METABOLIC PANEL
ALT: 20 U/L (ref 0–44)
AST: 20 U/L (ref 15–41)
Albumin: 3.8 g/dL (ref 3.5–5.0)
Alkaline Phosphatase: 82 U/L (ref 38–126)
Anion gap: 9 (ref 5–15)
BUN: 28 mg/dL — ABNORMAL HIGH (ref 6–20)
CO2: 26 mmol/L (ref 22–32)
Calcium: 9.6 mg/dL (ref 8.9–10.3)
Chloride: 102 mmol/L (ref 98–111)
Creatinine, Ser: 1.61 mg/dL — ABNORMAL HIGH (ref 0.61–1.24)
GFR, Estimated: 49 mL/min — ABNORMAL LOW (ref >60)
Glucose, Bld: 312 mg/dL — ABNORMAL HIGH (ref 70–99)
Potassium: 4.8 mmol/L (ref 3.5–5.1)
Sodium: 137 mmol/L (ref 135–145)
Total Bilirubin: 0.7 mg/dL (ref 0.3–1.2)
Total Protein: 7.3 g/dL (ref 6.5–8.1)

## 2020-12-27 LAB — TYPE AND SCREEN
ABO/RH(D): A POS
Antibody Screen: NEGATIVE

## 2020-12-27 LAB — PROTIME-INR
INR: 1 (ref 0.8–1.2)
Prothrombin Time: 13.6 seconds (ref 11.4–15.2)

## 2020-12-27 LAB — GLUCOSE, CAPILLARY: Glucose-Capillary: 345 mg/dL — ABNORMAL HIGH (ref 70–99)

## 2020-12-27 LAB — URINALYSIS, ROUTINE W REFLEX MICROSCOPIC
Bacteria, UA: NONE SEEN
Bilirubin Urine: NEGATIVE
Glucose, UA: >=500 mg/dL — AB
Hgb urine dipstick: NEGATIVE
Ketones, ur: NEGATIVE mg/dL
Leukocytes,Ua: NEGATIVE
Nitrite: NEGATIVE
Protein, ur: 30 mg/dL — AB
Specific Gravity, Urine: 1.028 (ref 1.005–1.030)
pH: 6 (ref 5.0–8.0)

## 2020-12-27 LAB — APTT: aPTT: 33 seconds (ref 24–36)

## 2020-12-27 LAB — SARS CORONAVIRUS 2 (TAT 6-24 HRS): SARS Coronavirus 2: NEGATIVE

## 2020-12-27 NOTE — Progress Notes (Signed)
Spoke with Ruby Cola at [place] to notify of patient's upcoming surgery on Friday 12/27/20 and the Electrophysiologist's recommendations. Ruby Cola verbalized understanding of surgery date, time, location, and recommendations.

## 2020-12-27 NOTE — Progress Notes (Addendum)
PCP - Letta Pate Cardiologist - Dr. Stanford Breed  PPM/ICD - Yes. [employer] Device Orders - Yes. In Epic 12/01/20 and copy placed on front of chart Rep Notified Ruby Cola- [phone]. Spoke with Ruby Cola 12/27/20 at 8:52 am.   Chest x-ray - 10/26/20 EKG - 10/26/20 Stress Test - 2013 ECHO - 06/04/20 Cardiac Cath - 10/28/19  Sleep Study - Yes. 03/2019 CPAP - Wears approximately once a week.  CBG today= 345. Patient states he did not take any of his medications this morning. States he is going home to take meds after PAT appointment. Patient states he drank coffee with cream and sugar this morning and had a bowl if chili at 3 am.  Checks Blood Sugar once a day Normal range- 140-150 per patient   Blood Thinner Instructions: Per patient he was instructed to take his last dose of Eliquis today 12/27/20 and to start taking Plavix 3 days before surgery.  Aspirin Instructions: n/a  ERAS Protcol - Nothing to eat or drink after midnight PRE-SURGERY Ensure or G2- n/a  COVID TEST- Yes. 12/27/20 at PAT appointment.    Anesthesia review: Yes. CBG 345 at PAT appointment. Karoline Caldwell, PA made aware. Per Jeneen Rinks, patient instructed to take medications as prescribed when he gets home and to monitor blood sugar.  Patient denies shortness of breath, fever, cough and chest pain at PAT appointment   All instructions explained to the patient, with a verbal understanding of the material. Patient agrees to go over the instructions while at home for a better understanding. Patient also instructed to self quarantine after being tested for COVID-19. The opportunity to ask questions was provided.

## 2020-12-30 ENCOUNTER — Encounter (HOSPITAL_COMMUNITY)
Admission: RE | Disposition: A | Discharge status: Home / Self Care | Payer: Self-pay | Source: Home / Self Care | Attending: Surgery

## 2020-12-30 ENCOUNTER — Other Ambulatory Visit: Payer: Self-pay

## 2020-12-30 ENCOUNTER — Encounter (HOSPITAL_COMMUNITY): Payer: Self-pay | Admitting: Surgery

## 2020-12-30 ENCOUNTER — Inpatient Hospital Stay (HOSPITAL_COMMUNITY)
Admission: RE | Admit: 2020-12-30 | Discharge: 2020-12-31 | DRG: 035 | Disposition: A | Discharge status: Home / Self Care | Payer: Medicaid Other | Attending: Surgery | Admitting: Surgery

## 2020-12-30 ENCOUNTER — Inpatient Hospital Stay (HOSPITAL_COMMUNITY): Payer: Medicaid Other | Admitting: Emergency Medicine

## 2020-12-30 ENCOUNTER — Inpatient Hospital Stay (HOSPITAL_COMMUNITY): Payer: Medicaid Other

## 2020-12-30 DIAGNOSIS — I110 Hypertensive heart disease with heart failure: Secondary | ICD-10-CM | POA: Diagnosis present

## 2020-12-30 DIAGNOSIS — I5022 Chronic systolic (congestive) heart failure: Secondary | ICD-10-CM | POA: Diagnosis present

## 2020-12-30 DIAGNOSIS — Z951 Presence of aortocoronary bypass graft: Secondary | ICD-10-CM | POA: Diagnosis not present

## 2020-12-30 DIAGNOSIS — Z7982 Long term (current) use of aspirin: Secondary | ICD-10-CM

## 2020-12-30 DIAGNOSIS — Z7902 Long term (current) use of antithrombotics/antiplatelets: Secondary | ICD-10-CM

## 2020-12-30 DIAGNOSIS — I70203 Unspecified atherosclerosis of native arteries of extremities, bilateral legs: Secondary | ICD-10-CM | POA: Diagnosis present

## 2020-12-30 DIAGNOSIS — E1151 Type 2 diabetes mellitus with diabetic peripheral angiopathy without gangrene: Secondary | ICD-10-CM | POA: Diagnosis present

## 2020-12-30 DIAGNOSIS — I6522 Occlusion and stenosis of left carotid artery: Principal | ICD-10-CM | POA: Diagnosis present

## 2020-12-30 DIAGNOSIS — Z006 Encounter for examination for normal comparison and control in clinical research program: Secondary | ICD-10-CM

## 2020-12-30 DIAGNOSIS — Z95810 Presence of automatic (implantable) cardiac defibrillator: Secondary | ICD-10-CM | POA: Diagnosis not present

## 2020-12-30 DIAGNOSIS — E7800 Pure hypercholesterolemia, unspecified: Secondary | ICD-10-CM | POA: Diagnosis present

## 2020-12-30 DIAGNOSIS — Z79899 Other long term (current) drug therapy: Secondary | ICD-10-CM

## 2020-12-30 DIAGNOSIS — I2510 Atherosclerotic heart disease of native coronary artery without angina pectoris: Secondary | ICD-10-CM | POA: Diagnosis present

## 2020-12-30 DIAGNOSIS — I252 Old myocardial infarction: Secondary | ICD-10-CM | POA: Diagnosis not present

## 2020-12-30 DIAGNOSIS — Z95828 Presence of other vascular implants and grafts: Secondary | ICD-10-CM | POA: Diagnosis not present

## 2020-12-30 DIAGNOSIS — Z7984 Long term (current) use of oral hypoglycemic drugs: Secondary | ICD-10-CM | POA: Diagnosis not present

## 2020-12-30 DIAGNOSIS — I4891 Unspecified atrial fibrillation: Secondary | ICD-10-CM | POA: Diagnosis present

## 2020-12-30 DIAGNOSIS — Z794 Long term (current) use of insulin: Secondary | ICD-10-CM

## 2020-12-30 DIAGNOSIS — Z885 Allergy status to narcotic agent status: Secondary | ICD-10-CM | POA: Diagnosis not present

## 2020-12-30 DIAGNOSIS — I255 Ischemic cardiomyopathy: Secondary | ICD-10-CM | POA: Diagnosis present

## 2020-12-30 DIAGNOSIS — Z7901 Long term (current) use of anticoagulants: Secondary | ICD-10-CM | POA: Diagnosis not present

## 2020-12-30 HISTORY — PX: ULTRASOUND GUIDANCE FOR VASCULAR ACCESS: SHX6516

## 2020-12-30 HISTORY — PX: TRANSCAROTID ARTERY REVASCULARIZATIONÂ: SHX6778

## 2020-12-30 LAB — GLUCOSE, CAPILLARY
Glucose-Capillary: 146 mg/dL — ABNORMAL HIGH (ref 70–99)
Glucose-Capillary: 140 mg/dL — ABNORMAL HIGH (ref 70–99)
Glucose-Capillary: 156 mg/dL — ABNORMAL HIGH (ref 70–99)
Glucose-Capillary: 259 mg/dL — ABNORMAL HIGH (ref 70–99)
Glucose-Capillary: 266 mg/dL — ABNORMAL HIGH (ref 70–99)

## 2020-12-30 SURGERY — TRANSCAROTID ARTERY REVASCULARIZATION (TCAR)
Anesthesia: General | Site: Groin | Laterality: Right

## 2020-12-30 MED ORDER — GUAIFENESIN-DM 100-10 MG/5ML PO SYRP: 15.0000 mL | ORAL_SOLUTION | ORAL | Status: DC | PRN

## 2020-12-30 MED ORDER — MIDAZOLAM HCL 5 MG/5ML IJ SOLN
INTRAMUSCULAR | Status: DC | PRN
  Administered 2020-12-30 (×2): 1 mg via INTRAVENOUS

## 2020-12-30 MED ORDER — PROTAMINE SULFATE 10 MG/ML IV SOLN
INTRAVENOUS | Status: DC | PRN
  Administered 2020-12-30 (×2): 10 mg via INTRAVENOUS
  Administered 2020-12-30: 20 mg via INTRAVENOUS
  Administered 2020-12-30: 10 mg via INTRAVENOUS

## 2020-12-30 MED ORDER — CLOPIDOGREL BISULFATE 75 MG PO TABS
75.0000 mg | ORAL_TABLET | Freq: Every day | ORAL | Status: DC
  Administered 2020-12-30 – 2020-12-31 (×2): 75 mg via ORAL
  Filled 2020-12-30 (×2): qty 1

## 2020-12-30 MED ORDER — CHLORHEXIDINE GLUCONATE 0.12 % MT SOLN
OROMUCOSAL | Status: AC
  Administered 2020-12-30: 15 mL
  Filled 2020-12-30: qty 15

## 2020-12-30 MED ORDER — ACETAMINOPHEN 325 MG PO TABS: 325.0000 mg | ORAL_TABLET | ORAL | Status: DC | PRN

## 2020-12-30 MED ORDER — SODIUM CHLORIDE 0.9 % IV SOLN: INTRAVENOUS | Status: DC

## 2020-12-30 MED ORDER — HYDROMORPHONE HCL 1 MG/ML IJ SOLN
0.5000 mg | INTRAMUSCULAR | Status: DC | PRN
  Administered 2020-12-30 – 2020-12-31 (×3): 0.5 mg via INTRAVENOUS
  Filled 2020-12-30 (×3): qty 1

## 2020-12-30 MED ORDER — POTASSIUM CHLORIDE CRYS ER 20 MEQ PO TBCR: 20.0000 meq | EXTENDED_RELEASE_TABLET | Freq: Every day | ORAL | Status: DC | PRN

## 2020-12-30 MED ORDER — EMPAGLIFLOZIN 25 MG PO TABS
25.0000 mg | ORAL_TABLET | Freq: Every day | ORAL | Status: DC
  Administered 2020-12-31: 25 mg via ORAL
  Filled 2020-12-30: qty 1

## 2020-12-30 MED ORDER — PHENOL 1.4 % MT LIQD: 1.0000 | OROMUCOSAL | Status: DC | PRN

## 2020-12-30 MED ORDER — ACETAMINOPHEN 10 MG/ML IV SOLN
1000.0000 mg | Freq: Once | INTRAVENOUS | Status: DC | PRN
  Administered 2020-12-30: 1000 mg via INTRAVENOUS

## 2020-12-30 MED ORDER — CARVEDILOL 25 MG PO TABS
25.0000 mg | ORAL_TABLET | Freq: Two times a day (BID) | ORAL | Status: DC
  Administered 2020-12-30 – 2020-12-31 (×2): 25 mg via ORAL
  Filled 2020-12-30 (×2): qty 1

## 2020-12-30 MED ORDER — DEXAMETHASONE SODIUM PHOSPHATE 10 MG/ML IJ SOLN
INTRAMUSCULAR | Status: DC | PRN
  Administered 2020-12-30: 5 mg via INTRAVENOUS

## 2020-12-30 MED ORDER — FENTANYL CITRATE (PF) 100 MCG/2ML IJ SOLN
25.0000 ug | INTRAMUSCULAR | Status: DC | PRN
  Administered 2020-12-30 (×2): 50 ug via INTRAVENOUS

## 2020-12-30 MED ORDER — PHENYLEPHRINE HCL-NACL 20-0.9 MG/250ML-% IV SOLN
INTRAVENOUS | Status: DC | PRN
  Administered 2020-12-30: 100 ug/min via INTRAVENOUS
  Administered 2020-12-30: 50 ug/min via INTRAVENOUS

## 2020-12-30 MED ORDER — CEFAZOLIN SODIUM-DEXTROSE 2-4 GM/100ML-% IV SOLN
2.0000 g | INTRAVENOUS | Status: DC
  Filled 2020-12-30: qty 100

## 2020-12-30 MED ORDER — INSULIN ASPART 100 UNIT/ML IJ SOLN
0.0000 [IU] | Freq: Three times a day (TID) | INTRAMUSCULAR | Status: DC
  Administered 2020-12-30: 8 [IU] via SUBCUTANEOUS
  Administered 2020-12-31: 5 [IU] via SUBCUTANEOUS
  Administered 2020-12-31: 3 [IU] via SUBCUTANEOUS

## 2020-12-30 MED ORDER — LIDOCAINE 2% (20 MG/ML) 5 ML SYRINGE
INTRAMUSCULAR | Status: DC | PRN
  Administered 2020-12-30: 40 mg via INTRAVENOUS

## 2020-12-30 MED ORDER — LABETALOL HCL 5 MG/ML IV SOLN
10.0000 mg | INTRAVENOUS | Status: DC | PRN
Start: 2020-12-30 — End: 2020-12-31

## 2020-12-30 MED ORDER — CEFAZOLIN SODIUM-DEXTROSE 2-4 GM/100ML-% IV SOLN
2.0000 g | Freq: Three times a day (TID) | INTRAVENOUS | Status: AC
  Administered 2020-12-30 – 2020-12-31 (×2): 2 g via INTRAVENOUS
  Filled 2020-12-30 (×2): qty 100

## 2020-12-30 MED ORDER — POLYETHYLENE GLYCOL 3350 17 G PO PACK: 17.0000 g | PACK | Freq: Every day | ORAL | Status: DC | PRN

## 2020-12-30 MED ORDER — IODIXANOL 320 MG/ML IV SOLN
INTRAVENOUS | Status: DC | PRN
  Administered 2020-12-30: 28 mL via INTRA_ARTERIAL

## 2020-12-30 MED ORDER — CHLORHEXIDINE GLUCONATE CLOTH 2 % EX PADS: 6.0000 | MEDICATED_PAD | Freq: Once | CUTANEOUS | Status: DC

## 2020-12-30 MED ORDER — HEMOSTATIC AGENTS (NO CHARGE) OPTIME
TOPICAL | Status: DC | PRN
  Administered 2020-12-30: 1 via TOPICAL

## 2020-12-30 MED ORDER — INSULIN DEGLUDEC 100 UNIT/ML ~~LOC~~ SOPN
30.0000 [IU] | PEN_INJECTOR | Freq: Every day | SUBCUTANEOUS | Status: DC
  Filled 2020-12-30 (×2): qty 3

## 2020-12-30 MED ORDER — CEFAZOLIN SODIUM-DEXTROSE 2-3 GM-%(50ML) IV SOLR
INTRAVENOUS | Status: DC | PRN
  Administered 2020-12-30: 2 g via INTRAVENOUS

## 2020-12-30 MED ORDER — PHENYLEPHRINE HCL-NACL 20-0.9 MG/250ML-% IV SOLN
INTRAVENOUS | Status: AC
  Filled 2020-12-30: qty 750

## 2020-12-30 MED ORDER — ACETAMINOPHEN 10 MG/ML IV SOLN
INTRAVENOUS | Status: AC
  Filled 2020-12-30: qty 100

## 2020-12-30 MED ORDER — ALUM & MAG HYDROXIDE-SIMETH 200-200-20 MG/5ML PO SUSP: 15.0000 mL | ORAL | Status: DC | PRN

## 2020-12-30 MED ORDER — ONDANSETRON HCL 4 MG/2ML IJ SOLN
INTRAMUSCULAR | Status: DC | PRN
  Administered 2020-12-30: 4 mg via INTRAVENOUS

## 2020-12-30 MED ORDER — ROCURONIUM BROMIDE 10 MG/ML (PF) SYRINGE
PREFILLED_SYRINGE | INTRAVENOUS | Status: DC | PRN
  Administered 2020-12-30 (×2): 50 mg via INTRAVENOUS
  Administered 2020-12-30: 20 mg via INTRAVENOUS

## 2020-12-30 MED ORDER — SPIRONOLACTONE 25 MG PO TABS
25.0000 mg | ORAL_TABLET | Freq: Every day | ORAL | Status: DC
  Administered 2020-12-31: 25 mg via ORAL
  Filled 2020-12-30: qty 1

## 2020-12-30 MED ORDER — DOCUSATE SODIUM 100 MG PO CAPS
100.0000 mg | ORAL_CAPSULE | Freq: Every day | ORAL | Status: DC
  Administered 2020-12-31: 100 mg via ORAL
  Filled 2020-12-30: qty 1

## 2020-12-30 MED ORDER — LACTATED RINGERS IV SOLN: INTRAVENOUS | Status: DC | PRN

## 2020-12-30 MED ORDER — DIPHENHYDRAMINE HCL 50 MG/ML IJ SOLN: 6.2500 mg | Freq: Four times a day (QID) | INTRAMUSCULAR | Status: DC | PRN

## 2020-12-30 MED ORDER — AMISULPRIDE (ANTIEMETIC) 5 MG/2ML IV SOLN: 10.0000 mg | Freq: Once | INTRAVENOUS | Status: DC | PRN

## 2020-12-30 MED ORDER — HEPARIN SODIUM (PORCINE) 1000 UNIT/ML IJ SOLN
INTRAMUSCULAR | Status: DC | PRN
  Administered 2020-12-30: 1000 [IU] via INTRAVENOUS

## 2020-12-30 MED ORDER — FENTANYL CITRATE (PF) 100 MCG/2ML IJ SOLN
INTRAMUSCULAR | Status: AC
  Filled 2020-12-30: qty 2

## 2020-12-30 MED ORDER — EPHEDRINE SULFATE-NACL 50-0.9 MG/10ML-% IV SOSY
PREFILLED_SYRINGE | INTRAVENOUS | Status: DC | PRN
Start: 2020-12-30 — End: 2020-12-30
  Administered 2020-12-30: 10 mg via INTRAVENOUS
  Administered 2020-12-30: 5 mg via INTRAVENOUS

## 2020-12-30 MED ORDER — GLYCOPYRROLATE PF 0.2 MG/ML IJ SOSY
PREFILLED_SYRINGE | INTRAMUSCULAR | Status: DC | PRN
  Administered 2020-12-30 (×2): .1 mg via INTRAVENOUS

## 2020-12-30 MED ORDER — 0.9 % SODIUM CHLORIDE (POUR BTL) OPTIME
TOPICAL | Status: DC | PRN
  Administered 2020-12-30: 1000 mL

## 2020-12-30 MED ORDER — BISACODYL 10 MG RE SUPP: 10.0000 mg | Freq: Every day | RECTAL | Status: DC | PRN

## 2020-12-30 MED ORDER — MAGNESIUM SULFATE 2 GM/50ML IV SOLN
2.0000 g | Freq: Every day | INTRAVENOUS | Status: DC | PRN
Start: 2020-12-30 — End: 2020-12-31

## 2020-12-30 MED ORDER — SACUBITRIL-VALSARTAN 97-103 MG PO TABS
1.0000 | ORAL_TABLET | Freq: Two times a day (BID) | ORAL | Status: DC
  Administered 2020-12-31: 1 via ORAL
  Filled 2020-12-30 (×2): qty 1

## 2020-12-30 MED ORDER — EZETIMIBE 10 MG PO TABS
10.0000 mg | ORAL_TABLET | Freq: Every day | ORAL | Status: DC
  Administered 2020-12-30 – 2020-12-31 (×2): 10 mg via ORAL
  Filled 2020-12-30 (×2): qty 1

## 2020-12-30 MED ORDER — SUGAMMADEX SODIUM 200 MG/2ML IV SOLN
INTRAVENOUS | Status: DC | PRN
  Administered 2020-12-30: 200 mg via INTRAVENOUS

## 2020-12-30 MED ORDER — HEPARIN 6000 UNIT IRRIGATION SOLUTION
Status: AC
  Filled 2020-12-30: qty 500

## 2020-12-30 MED ORDER — FENTANYL CITRATE (PF) 250 MCG/5ML IJ SOLN
INTRAMUSCULAR | Status: AC
  Filled 2020-12-30: qty 5

## 2020-12-30 MED ORDER — PROPOFOL 10 MG/ML IV BOLUS
INTRAVENOUS | Status: DC | PRN
  Administered 2020-12-30: 100 mg via INTRAVENOUS

## 2020-12-30 MED ORDER — PANTOPRAZOLE SODIUM 40 MG PO TBEC
40.0000 mg | DELAYED_RELEASE_TABLET | Freq: Every day | ORAL | Status: DC
  Administered 2020-12-30 – 2020-12-31 (×2): 40 mg via ORAL
  Filled 2020-12-30 (×2): qty 1

## 2020-12-30 MED ORDER — METOPROLOL TARTRATE 5 MG/5ML IV SOLN: 2.0000 mg | INTRAVENOUS | Status: DC | PRN

## 2020-12-30 MED ORDER — PROPOFOL 10 MG/ML IV BOLUS
INTRAVENOUS | Status: AC
  Filled 2020-12-30: qty 20

## 2020-12-30 MED ORDER — LIDOCAINE HCL (PF) 1 % IJ SOLN
INTRAMUSCULAR | Status: AC
  Filled 2020-12-30: qty 5

## 2020-12-30 MED ORDER — MIDAZOLAM HCL 2 MG/2ML IJ SOLN
INTRAMUSCULAR | Status: AC
  Filled 2020-12-30: qty 2

## 2020-12-30 MED ORDER — OXYCODONE-ACETAMINOPHEN 5-325 MG PO TABS
1.0000 | ORAL_TABLET | Freq: Four times a day (QID) | ORAL | Status: DC | PRN
  Administered 2020-12-30 – 2020-12-31 (×3): 1 via ORAL
  Filled 2020-12-30 (×3): qty 1

## 2020-12-30 MED ORDER — ACETAMINOPHEN 650 MG RE SUPP: 325.0000 mg | RECTAL | Status: DC | PRN

## 2020-12-30 MED ORDER — FUROSEMIDE 40 MG PO TABS
40.0000 mg | ORAL_TABLET | Freq: Every day | ORAL | Status: DC
  Administered 2020-12-31: 40 mg via ORAL
  Filled 2020-12-30: qty 1

## 2020-12-30 MED ORDER — OXYCODONE HCL 5 MG PO TABS: 5.0000 mg | ORAL_TABLET | Freq: Once | ORAL | Status: DC | PRN

## 2020-12-30 MED ORDER — HEPARIN 6000 UNIT IRRIGATION SOLUTION
Status: DC | PRN
  Administered 2020-12-30: 1

## 2020-12-30 MED ORDER — PHENYLEPHRINE 40 MCG/ML (10ML) SYRINGE FOR IV PUSH (FOR BLOOD PRESSURE SUPPORT)
PREFILLED_SYRINGE | INTRAVENOUS | Status: DC | PRN
  Administered 2020-12-30 (×2): 80 ug via INTRAVENOUS
  Administered 2020-12-30: 120 ug via INTRAVENOUS

## 2020-12-30 MED ORDER — OXYCODONE HCL 5 MG/5ML PO SOLN: 5.0000 mg | Freq: Once | ORAL | Status: DC | PRN

## 2020-12-30 MED ORDER — SODIUM CHLORIDE 0.9 % IV SOLN: 500.0000 mL | Freq: Once | INTRAVENOUS | Status: DC | PRN

## 2020-12-30 MED ORDER — DIPHENHYDRAMINE HCL 25 MG PO CAPS
25.0000 mg | ORAL_CAPSULE | Freq: Four times a day (QID) | ORAL | Status: DC | PRN
  Filled 2020-12-30: qty 1

## 2020-12-30 MED ORDER — FENTANYL CITRATE (PF) 100 MCG/2ML IJ SOLN
INTRAMUSCULAR | Status: DC | PRN
  Administered 2020-12-30 (×2): 50 ug via INTRAVENOUS

## 2020-12-30 MED ORDER — HYDRALAZINE HCL 20 MG/ML IJ SOLN: 5.0000 mg | INTRAMUSCULAR | Status: DC | PRN

## 2020-12-30 MED ORDER — ATORVASTATIN CALCIUM 40 MG PO TABS
40.0000 mg | ORAL_TABLET | Freq: Every day | ORAL | Status: DC
  Administered 2020-12-30 – 2020-12-31 (×2): 40 mg via ORAL
  Filled 2020-12-30 (×2): qty 1

## 2020-12-30 MED ORDER — ACETAMINOPHEN 160 MG/5ML PO SOLN: 325.0000 mg | ORAL | Status: DC | PRN

## 2020-12-30 SURGICAL SUPPLY — 58 items
ADH SKN CLS APL DERMABOND .7 (GAUZE/BANDAGES/DRESSINGS) ×2
ADH SKN CLS LQ APL DERMABOND (GAUZE/BANDAGES/DRESSINGS) ×4
AGENT HMST KT MTR STRL THRMB (HEMOSTASIS) ×2
BAG BANDED W/RUBBER/TAPE 36X54 (MISCELLANEOUS) ×3 IMPLANT
BAG COUNTER SPONGE SURGICOUNT (BAG) ×3 IMPLANT
BAG EQP BAND 135X91 W/RBR TAPE (MISCELLANEOUS) ×2
BAG SPNG CNTER NS LX DISP (BAG) ×2
BALLN STERLING RX 6X30X80 (BALLOONS) ×3
BALLOON STERLING RX 6X30X80 (BALLOONS) IMPLANT
CANISTER SUCT 3000ML PPV (MISCELLANEOUS) ×3 IMPLANT
CATH SUCT 10FR WHISTLE TIP (CATHETERS) ×2 IMPLANT
CLIP VESOCCLUDE MED 6/CT (CLIP) ×3 IMPLANT
CLIP VESOCCLUDE SM WIDE 6/CT (CLIP) ×3 IMPLANT
COVER DOME SNAP 22 D (MISCELLANEOUS) ×3 IMPLANT
COVER PROBE W GEL 5X96 (DRAPES) ×3 IMPLANT
DERMABOND ADHESIVE PROPEN (GAUZE/BANDAGES/DRESSINGS) ×2
DERMABOND ADVANCED (GAUZE/BANDAGES/DRESSINGS) ×1
DERMABOND ADVANCED .7 DNX12 (GAUZE/BANDAGES/DRESSINGS) ×2 IMPLANT
DERMABOND ADVANCED .7 DNX6 (GAUZE/BANDAGES/DRESSINGS) IMPLANT
DRAPE FEMORAL ANGIO 80X135IN (DRAPES) ×3 IMPLANT
ELECT REM PT RETURN 9FT ADLT (ELECTROSURGICAL) ×3
ELECTRODE REM PT RTRN 9FT ADLT (ELECTROSURGICAL) ×2 IMPLANT
GLOVE SRG 8 PF TXTR STRL LF DI (GLOVE) ×2 IMPLANT
GLOVE SURG POLYISO LF SZ7.5 (GLOVE) ×3 IMPLANT
GLOVE SURG UNDER POLY LF SZ8 (GLOVE) ×3
GOWN STRL REUS W/ TWL LRG LVL3 (GOWN DISPOSABLE) ×4 IMPLANT
GOWN STRL REUS W/ TWL XL LVL3 (GOWN DISPOSABLE) ×2 IMPLANT
GOWN STRL REUS W/TWL LRG LVL3 (GOWN DISPOSABLE) ×6
GOWN STRL REUS W/TWL XL LVL3 (GOWN DISPOSABLE) ×3
GUIDEWIRE ENROUTE 0.014 (WIRE) ×3 IMPLANT
HEMOSTAT SNOW SURGICEL 2X4 (HEMOSTASIS) IMPLANT
INTRODUCER KIT GALT 7CM (INTRODUCER) ×3
KIT BASIN OR (CUSTOM PROCEDURE TRAY) ×3 IMPLANT
KIT ENCORE 26 ADVANTAGE (KITS) ×3 IMPLANT
KIT INTRODUCER GALT 7 (INTRODUCER) ×2 IMPLANT
KIT TURNOVER KIT B (KITS) ×3 IMPLANT
NDL HYPO 25GX1X1/2 BEV (NEEDLE) IMPLANT
NEEDLE HYPO 25GX1X1/2 BEV (NEEDLE) IMPLANT
PACK CAROTID (CUSTOM PROCEDURE TRAY) ×3 IMPLANT
POSITIONER HEAD DONUT 9IN (MISCELLANEOUS) ×3 IMPLANT
SET MICROPUNCTURE 5F STIFF (MISCELLANEOUS) ×2 IMPLANT
STENT TRANSCAROTID SYSTEM 8X40 (Permanent Stent) ×1 IMPLANT
SURGIFLO W/THROMBIN 8M KIT (HEMOSTASIS) ×1 IMPLANT
SUT PROLENE 5 0 C 1 24 (SUTURE) ×5 IMPLANT
SUT PROLENE 6 0 BV (SUTURE) ×1 IMPLANT
SUT SILK 2 0 PERMA HAND 18 BK (SUTURE) ×3 IMPLANT
SUT SILK 2 0 SH (SUTURE) ×3 IMPLANT
SUT VIC AB 3-0 SH 27 (SUTURE) ×3
SUT VIC AB 3-0 SH 27X BRD (SUTURE) ×4 IMPLANT
SUT VIC AB 4-0 PS2 27 (SUTURE) ×3 IMPLANT
SYR 10ML LL (SYRINGE) ×9 IMPLANT
SYR 20ML LL LF (SYRINGE) ×3 IMPLANT
SYR CONTROL 10ML LL (SYRINGE) IMPLANT
SYSTEM TRANSCAROTID NEUROPRTCT (MISCELLANEOUS) ×2 IMPLANT
TOWEL GREEN STERILE (TOWEL DISPOSABLE) ×3 IMPLANT
TRANSCAROTID NEUROPROTECT SYS (MISCELLANEOUS) ×3
WATER STERILE IRR 1000ML POUR (IV SOLUTION) ×3 IMPLANT
WIRE BENTSON .035X145CM (WIRE) ×3 IMPLANT

## 2020-12-30 NOTE — Progress Notes (Addendum)
Pt admitted to [facility] from PACU.  Pt placed on telemetry and CCMD notified.  CHG bath completed.  Pt is A&O X4 and neuro intact. NIH scale is 0. Vitals taken and all within normal range.  Pt has left sided headache, 6 out of 10.

## 2020-12-30 NOTE — Progress Notes (Addendum)
  Day of Surgery Note    Subjective:  no complaints   Vitals:   12/30/20 0956 12/30/20 1011  BP: 139/77 127/63  Pulse: 61 (!) 56  Resp: 12 18  Temp: (!) 97 F (36.1 C)   SpO2: 100% 96%    Incisions:   clean and dry; right groin is soft without hematoma Extremities:  moving all extremities equally Lungs:  non labored Neuro:  in tact; tongue is midline   Assessment/Plan:  This is a 60 y.o. male who is s/p  Left TCAR  -pt neuro in tact -to [place] later today -continue plavix with plans to restart eliquis tomorrow -given CHF, IVF at 50cc/hr -pt with allergy to codeine, however Tramadol with prolonged QT interval.  Percocet ordered.  Will have benadryl ordered as prn.  Zofran also not ordered due to prolonged QT interval.   -pt to be dc'd on Keflex '500mg'$  bid x 10 days for stitch abscess.    Leontine Locket, PA-C 12/30/2020 10:18 AM [phone]

## 2020-12-30 NOTE — Anesthesia Preprocedure Evaluation (Addendum)
Anesthesia Evaluation  Patient identified by MRN, date of birth, ID band Patient awake    Reviewed: Allergy & Precautions, NPO status , Patient's Chart, lab work & pertinent test results  Airway Mallampati: II  TM Distance: >3 FB Neck ROM: Full    Dental  (+) Edentulous Upper, Edentulous Lower   Pulmonary sleep apnea , former smoker,     + decreased breath sounds      Cardiovascular hypertension, + CAD, + Cardiac Stents, + CABG, + Peripheral Vascular Disease and +CHF  + Cardiac Defibrillator  Rhythm:Regular Rate:Normal     Neuro/Psych Anxiety negative neurological ROS     GI/Hepatic negative GI ROS, Neg liver ROS,   Endo/Other  diabetes  Renal/GU negative Renal ROS     Musculoskeletal negative musculoskeletal ROS (+)   Abdominal (+) + obese,   Peds  Hematology negative hematology ROS (+)   Anesthesia Other Findings   Reproductive/Obstetrics                            Anesthesia Physical Anesthesia Plan  ASA: 4  Anesthesia Plan: General   Post-op Pain Management:    Induction: Intravenous  PONV Risk Score and Plan: 3 and Ondansetron, Dexamethasone and Midazolam  Airway Management Planned: Oral ETT  Additional Equipment: Arterial line  Intra-op Plan:   Post-operative Plan: Extubation in OR  Informed Consent: I have reviewed the patients History and Physical, chart, labs and discussed the procedure including the risks, benefits and alternatives for the proposed anesthesia with the patient or authorized representative who has indicated his/her understanding and acceptance.     Dental advisory given  Plan Discussed with: CRNA  Anesthesia Plan Comments: (Echo: 1. Left ventricular ejection fraction, by estimation, is 25 to 30%. The  left ventricle has severely decreased function. The left ventricle  demonstrates regional wall motion abnormalities (see scoring   diagram/findings for description). There is mild  concentric left ventricular hypertrophy. Left ventricular diastolic  parameters are consistent with Grade III diastolic dysfunction  (restrictive).  2. Right ventricular systolic function is moderately reduced. The right  ventricular size is normal. There is normal pulmonary artery systolic  pressure. The estimated right ventricular systolic pressure is [id number] mmHg.  3. Left atrial size was severely dilated.  4. The mitral valve is normal in structure. No evidence of mitral valve  regurgitation. No evidence of mitral stenosis.  5. Tricuspid regurgitation is eccentric; mild but may be underestimated  in this study.  6. The aortic valve is grossly normal. Aortic valve regurgitation is not  visualized.  7. The inferior vena cava is normal in size with greater than 50%  respiratory variability, suggesting right atrial pressure of 3 mmHg. )       Anesthesia Quick Evaluation

## 2020-12-30 NOTE — Anesthesia Procedure Notes (Signed)
Procedure Name: Intubation Date/Time: 12/30/2020 8:00 AM Performed by: Georgia Duff, CRNA Pre-anesthesia Checklist: Patient identified, Emergency Drugs available, Suction available and Patient being monitored Patient Re-evaluated:Patient Re-evaluated prior to induction Oxygen Delivery Method: Circle system utilized Preoxygenation: Pre-oxygenation with 100% oxygen Induction Type: IV induction Ventilation: Oral airway inserted - appropriate to patient size and Two handed mask ventilation required Laryngoscope Size: Mac and 4 Grade View: Grade I Tube type: Oral Tube size: 7.0 mm Number of attempts: 1 Secured at: 24 cm Tube secured with: Tape Dental Injury: Teeth and Oropharynx as per pre-operative assessment

## 2020-12-30 NOTE — Op Note (Signed)
Patient name: [name] MRN: [id number] DOB: Jun 09, 1960 Sex: male  12/30/2020 Pre-operative Diagnosis: Asymptomatic left carotid stenosis Post-operative diagnosis:  Same Surgeon:  [name] Assistants:  Leontine Locket, PA Procedure:   #1: Left carotid stent (TCAR)   #2: Retrograde flow reversal neuro protection   #3: Ultrasound-guided access, right femoral artery   #4: Ultrasound-guided access brachial artery Anesthesia:  General Blood Loss:  Minimal Specimens:  none  Findings: Approximately 80% carotid stenosis, down to less than 10% following stenting  Stent:  ENROUTE 8 x 40 Predilatation balloon: 6 x 30 Contrast: 28 cc Dose area: 883.92 Fluoroscopy 2.9 minutes Procedure time 45 minutes  Indications: This is a 60 year old gentleman with severe bilateral carotid disease.  He has previously undergone right-sided stenting and comes in today for the left side  Procedure:  The patient was identified in the holding area and taken to [place] [facility]  The patient was then placed supine on the table. general anesthesia was administered.  The patient was prepped and draped in the usual sterile fashion.  A time out was called and antibiotics were administered.  A PA was necessary to expedite the procedure and assist with technical details.  Arterial monitoring was challenging and so I placed a right brachial artery A-line under ultrasound guidance.  This was done cannulating the brachial artery with a micropuncture needle under ultrasound guidance and inserting an 018 wire.  A 5 French micropuncture sheath was then inserted and then transduced for arterial pressure.  Ultrasound was used to evaluate the right common femoral vein which was widely patent and easily compressible.  It was cannulated under ultrasound guidance with a micropuncture needle.  A [place] wire was advanced without resistance followed by placement of micropuncture sheath.  An 035 wire was then inserted and the TCAR  sheath was placed which flushed and aspirated without difficulty.  Ultrasound was used to evaluate the carotid artery of the left neck.  A transverse incision was then made above the clavicle.  Cautery was used divide subcutaneous tissue and platysma muscle.  The carotid artery was directly posterior to the internal jugular vein as well as the vagus nerve.  These were mobilized out of the way and protected.  I then encircled the common carotid artery with a Potts to blue vessel loop and an umbilical tape.  Patient was fully heparinized.  A blue stitch with a 5-0 Prolene was placed on the anterior surface of the common carotid artery.  The carotid artery was then cannulated with a micropuncture needle.  An 018 wire was advanced up to the mark on the wire and a micropuncture sheath was inserted for 3 cm.  A contrast injection was performed locating the carotid bifurcation which was marked on the screen.  I then inserted a carotid Amplatz wire.  Next, the TCAR sheath was inserted under fluoroscopic visualization, making sure the wire did not cross the lesion.  The sheath was then sutured to the skin with silk suture.  The tubing was then connected and passive flow reversal was initiated and confirmed with a saline flush.  A TCAR timeout was then performed.  A 018 wire and a 6 x 30 balloon were then loaded into the sheath.  A contrast injection was performed locating the bifurcation.  There is no evidence of dissection from the sheath.  Next, I cannulated the internal carotid artery with the wire.  The lesion was predilated using the 6 x 30 balloon.  There was  no hemodynamic response.  I then inserted the stent which was a 8 x 40.  This was deployed across the lesion.  I waited 2 minutes and performed completion angiography which showed resolution of the stenosis with no complicating features.  The wire was then removed.  Active flow reversal was discontinued.  The sheath was then removed and the U stitch was used  to close the arteriotomy.  Hand-held Doppler was used to evaluate the signal in the common carotid artery which was normal.  The patient was then given 50 mg of protamine.  The sheath in the groin was removed and manual pressure was held until hemostatic.  I then irrigated the neck incision.  It was hemostatic.  The platysma muscle was closed with 3-0 Vicryl.  The skin was closed with 3-0 Vicryl followed by Dermabond.  There were no immediate complications.  The patient was successfully extubated taken recovery in stable condition.  He is neurologically intact.   Disposition: To PACU stable.   Theotis Burrow, M.D., [facility] [place] Office: [phone] Pager:  [phone]

## 2020-12-30 NOTE — Anesthesia Postprocedure Evaluation (Signed)
Anesthesia Post Note  Patient: [name]  Procedure(s) Performed: LEFT TRANSCAROTID ARTERY REVASCULARIZATION (Left) ULTRASOUND GUIDANCE FOR VASCULAR ACCESS, RIGHT FEMORAL VEIN (Right: Groin)     Patient location during evaluation: PACU Anesthesia Type: General Level of consciousness: awake and alert Pain management: pain level controlled Vital Signs Assessment: post-procedure vital signs reviewed and stable Respiratory status: spontaneous breathing, nonlabored ventilation, respiratory function stable and patient connected to nasal cannula oxygen Cardiovascular status: blood pressure returned to baseline and stable Postop Assessment: no apparent nausea or vomiting Anesthetic complications: no   No notable events documented.  Last Vitals:  Vitals:   12/30/20 1600 12/30/20 1640  BP: 128/62   Pulse: 64 68  Resp: 16 10  Temp: 36.5 C   SpO2: 90% 94%             Effie Berkshire

## 2020-12-30 NOTE — Transfer of Care (Signed)
Immediate Anesthesia Transfer of Care Note  Patient: [name]  Procedure(s) Performed: LEFT TRANSCAROTID ARTERY REVASCULARIZATION (Left) ULTRASOUND GUIDANCE FOR VASCULAR ACCESS, RIGHT FEMORAL ARTERY (Right: Groin)  Patient Location: PACU  Anesthesia Type:General  Level of Consciousness: awake, alert , oriented and patient cooperative  Airway & Oxygen Therapy: Patient Spontanous Breathing and Patient connected to face mask oxygen  Post-op Assessment: Report given to RN and Post -op Vital signs reviewed and stable  Post vital signs: Reviewed and stable  Last Vitals:  Vitals Value Taken Time  BP 139/77 12/30/20 0956  Temp    Pulse 58 12/30/20 1001  Resp 15 12/30/20 1001  SpO2 100 % 12/30/20 1001  Vitals shown include unvalidated device data.  Last Pain:  Vitals:   12/30/20 0617  TempSrc:   PainSc: 0-No pain         Complications: No notable events documented.

## 2020-12-30 NOTE — Interval H&P Note (Signed)
History and Physical Interval Note:  12/30/2020 7:29 AM  [name]  has presented today for surgery, with the diagnosis of carotid artery stenosis.  The various methods of treatment have been discussed with the patient and family. After consideration of risks, benefits and other options for treatment, the patient has consented to  Procedure(s): LEFT TRANSCAROTID ARTERY REVASCULARIZATION (Left) as a surgical intervention.  The patient's history has been reviewed, patient examined, no change in status, stable for surgery.  I have reviewed the patient's chart and labs.  Questions were answered to the patient's satisfaction.     [name]

## 2020-12-31 DIAGNOSIS — Z95828 Presence of other vascular implants and grafts: Secondary | ICD-10-CM

## 2020-12-31 LAB — CBC
HCT: 44.6 % (ref 39.0–52.0)
Hemoglobin: 14.9 g/dL (ref 13.0–17.0)
MCH: 31.2 pg (ref 26.0–34.0)
MCHC: 33.4 g/dL (ref 30.0–36.0)
MCV: 93.5 fL (ref 80.0–100.0)
Platelets: 235 10*3/uL (ref 150–400)
RBC: 4.77 MIL/uL (ref 4.22–5.81)
RDW: 14.3 % (ref 11.5–15.5)
WBC: 10.7 10*3/uL — ABNORMAL HIGH (ref 4.0–10.5)
nRBC: 0 % (ref 0.0–0.2)

## 2020-12-31 LAB — BASIC METABOLIC PANEL
Anion gap: 6 (ref 5–15)
Anion gap: 9 (ref 5–15)
BUN: 25 mg/dL — ABNORMAL HIGH (ref 6–20)
BUN: 24 mg/dL — ABNORMAL HIGH (ref 6–20)
CO2: 25 mmol/L (ref 22–32)
CO2: 22 mmol/L (ref 22–32)
Calcium: 9 mg/dL (ref 8.9–10.3)
Calcium: 8.8 mg/dL — ABNORMAL LOW (ref 8.9–10.3)
Chloride: 103 mmol/L (ref 98–111)
Chloride: 103 mmol/L (ref 98–111)
Creatinine, Ser: 1.45 mg/dL — ABNORMAL HIGH (ref 0.61–1.24)
Creatinine, Ser: 1.24 mg/dL (ref 0.61–1.24)
GFR, Estimated: 56 mL/min — ABNORMAL LOW (ref >60)
GFR, Estimated: >60 mL/min (ref >60)
Glucose, Bld: 228 mg/dL — ABNORMAL HIGH (ref 70–99)
Glucose, Bld: 196 mg/dL — ABNORMAL HIGH (ref 70–99)
Potassium: 6.3 mmol/L (ref 3.5–5.1)
Potassium: 4.8 mmol/L (ref 3.5–5.1)
Sodium: 134 mmol/L — ABNORMAL LOW (ref 135–145)
Sodium: 134 mmol/L — ABNORMAL LOW (ref 135–145)

## 2020-12-31 LAB — GLUCOSE, CAPILLARY
Glucose-Capillary: 212 mg/dL — ABNORMAL HIGH (ref 70–99)
Glucose-Capillary: 197 mg/dL — ABNORMAL HIGH (ref 70–99)

## 2020-12-31 LAB — LIPID PANEL
Cholesterol: 104 mg/dL (ref 0–200)
HDL: 29 mg/dL — ABNORMAL LOW (ref >40)
LDL Cholesterol: 49 mg/dL (ref 0–99)
Total CHOL/HDL Ratio: 3.6 RATIO
Triglycerides: 128 mg/dL (ref <150)
VLDL: 26 mg/dL (ref 0–40)

## 2020-12-31 LAB — POCT ACTIVATED CLOTTING TIME: Activated Clotting Time: 341 seconds

## 2020-12-31 MED ORDER — CEPHALEXIN 500 MG PO CAPS: 500.0000 mg | ORAL_CAPSULE | Freq: Two times a day (BID) | ORAL | 0 refills | Status: DC

## 2020-12-31 MED ORDER — OXYCODONE-ACETAMINOPHEN 5-325 MG PO TABS: 1.0000 | ORAL_TABLET | Freq: Four times a day (QID) | ORAL | 0 refills | Status: DC | PRN

## 2020-12-31 NOTE — Progress Notes (Signed)
Pt discharged to home with wife.  Pt taken off telemetry and CCMD notified.  Pt's IV's removed.  Pt left with all of their personal belongings.  AVS documentation reviewed and sent home with Pt and all questions answered.

## 2020-12-31 NOTE — Discharge Summary (Addendum)
Discharge Summary     [name] 01/10/1961 60 y.o. male  [id number]  Admission Date: 12/30/2020  Discharge Date: 12/31/2020  Physician: Dr. Trula Slade  Admission Diagnosis: Asymptomatic carotid artery stenosis without infarction, left [I65.22]   HPI:   This is a 60 y.o. male who initially presented with claudication in the right leg as well as a wound in 2016.  ABI on the right was 0.34 and normal on the left.  On 01/17/2015 he underwent angiography revealing a distal superficial femoral and proximal popliteal artery occlusion which was successfully recanalized and stented using overlapping 6 mm Cordis stents.  At the same time the right common iliac artery was treated using an 8 x 39 balloon expandable stent.  The ulcer healed.  He then presented in 2017 with recurrent symptoms of claudication at less than 50 feet.  He underwent angiography on 12/20/2015 and was found to have several areas of stenosis within the stent which were successfully treated with drug-coated balloon angioplasty.     He then presented with worsening claudication symptoms without rest pain or wounds.  At that time he was complaining of some vision narrowing and darkening that occurs once a month in his right eye.  His carotid duplex showed high-grade bilateral lesions.  He was sent for CT scan for further evaluation.  This showed bilateral 80% carotid stenosis.  On 12/01/2020, he underwent right sided TCAR without incident.  He is back today for follow.  He denies any neurologic symptoms.  He has no further pain although he did have headaches for 3 days postprocedure     Patient suffers from coronary artery disease and is status post CABG in [id number] which was complicated by sternal wound.  He suffers from ischemic cardiomyopathy with chronic systolic congestive heart failure on Entresto.  He had a ICD implanted in June 2022.  He takes a statin for hypercholesterolemia.  He is on Eliquis for atrial fibrillation.  He  is a diabetic.  Hospital Course:  The patient was admitted to the hospital and taken to the operating room on 12/30/2020 and underwent left TCAR    Findings: Approximately 80% carotid stenosis, down to less than 10% following stenting  The pt tolerated the procedure well and was transported to the PACU in good condition.   By POD 1, the pt neuro status was in tact.  He had ambulated, voided.  He was discharged on Keflex for 10 days for drainage from right neck.     Recent Labs    12/31/20 0154 12/31/20 0414  NA 134* 134*  K 6.3* 4.8  CL 103 103  CO2 25 22  GLUCOSE 228* 196*  BUN 25* 24*  CALCIUM 9.0 8.8*   Recent Labs    12/31/20 0154  WBC 10.7*  HGB 14.9  HCT 44.6  PLT 235   No results for input(s): INR in the last 72 hours.   Discharge Instructions     Discharge patient   Complete by: As directed    Discharge disposition: 01-Home or Self Care   Discharge patient date: 12/31/2020       Discharge Diagnosis:  Asymptomatic carotid artery stenosis without infarction, left [I65.22]  Secondary Diagnosis: Patient Active Problem List   Diagnosis Date Noted   Asymptomatic carotid artery stenosis without infarction, left 12/30/2020   Carotid stenosis 12/02/2020   Carotid stenosis, right 12/01/2020   Carotid stenosis, symptomatic w/o infarct, right 12/01/2020   Acute on chronic HFrEF (heart failure with reduced ejection fraction) (  [place]) 06/04/2020   Essential hypertension 06/04/2020   Diabetes mellitus type 2 in nonobese ([place]) 06/04/2020   Sternal wound dehiscence 11/26/2019   Sternal wound infection 11/26/2019   Wound of sternal region 11/26/2019   Hx of CABG 11/23/2019   Ischemic cardiomyopathy 11/23/2019   PAF (paroxysmal atrial fibrillation) ([place]) 11/23/2019   PVD (peripheral vascular disease) ([place]) 11/23/2019   Chronic back pain 11/23/2019   Prolonged Q-T interval on ECG 11/23/2019   Stenosis of right carotid artery    Pleural effusion 10/26/2019   Acute  combined systolic and diastolic heart failure ([place]) 10/25/2019   Acute congestive heart failure ([place])    Superficial bacterial skin infection 06/16/2018   Bacterial skin infection 06/16/2018   CAD S/P percutaneous coronary angioplasty 08/29/2011   Tobacco abuse 08/29/2011   Hypertension 08/29/2011   Hyperlipidemia 08/29/2011   Diabetes mellitus ([place]) 08/29/2011   Past Medical History:  Diagnosis Date   AICD (automatic cardioverter/defibrillator) present    Anginal pain ([place])    Anxiety    CAD (coronary artery disease)    CHF (congestive heart failure) ([facility])    Chronic back pain    Colon polyps    Diabetes mellitus    Heart attack ([place])    Hyperlipidemia    Hypertension    Neuropathy    Panic attacks    PVD (peripheral vascular disease) ([place])    Sleep apnea     Allergies as of 12/31/2020       Reactions   Codeine Shortness Of Breath, Itching   Hydrocodone Shortness Of Breath, Nausea Only, Rash   Breathing problems ALSO   Gabapentin Other (See Comments)   Suicidal thoughts, extreme dreams   Pregabalin Other (See Comments)   Suicidal thoughts, extreme dreams    Simvastatin Other (See Comments)   Pain in joints   Lisinopril Itching, Rash   Venlafaxine Rash        Medication List     STOP taking these medications    traMADol 50 MG tablet Commonly known as: ULTRAM       TAKE these medications    apixaban 5 MG Tabs tablet Commonly known as: ELIQUIS Take 5 mg by mouth 2 (two) times daily.   atorvastatin 40 MG tablet Commonly known as: LIPITOR Take 40 mg by mouth daily.   carvedilol 25 MG tablet Commonly known as: COREG Take 25 mg by mouth 2 (two) times daily with a meal.   cephALEXin 500 MG capsule Commonly known as: KEFLEX Take 1 capsule (500 mg total) by mouth 2 (two) times daily.   clopidogrel 75 MG tablet Commonly known as: Plavix Take 1 tablet (75 mg total) by mouth daily.   Dulaglutide 1.5 MG/0.5ML Sopn Inject 1.5 mg into the skin every  Wednesday.   empagliflozin 25 MG Tabs tablet Commonly known as: Jardiance Take 1 tablet (25 mg total) by mouth daily before breakfast.   ezetimibe 10 MG tablet Commonly known as: Zetia Take 1 tablet (10 mg total) by mouth daily.   furosemide 40 MG tablet Commonly known as: LASIX Take 40 mg by mouth daily.   metFORMIN 1000 MG tablet Commonly known as: GLUCOPHAGE Take 1,000 mg by mouth 2 (two) times daily.   oxyCODONE-acetaminophen 5-325 MG tablet Commonly known as: Percocet Take 1 tablet by mouth every 6 (six) hours as needed for severe pain.   OXYGEN Inhale 3 L/min into the lungs at bedtime as needed (for shortness of breath).   sacubitril-valsartan 97-103 MG Commonly known as: ENTRESTO  Take 1 tablet by mouth 2 (two) times daily.   spironolactone 25 MG tablet Commonly known as: ALDACTONE Take 25 mg by mouth daily.   Tyler Aas FlexTouch 100 UNIT/ML FlexTouch Pen Generic drug: insulin degludec Inject 30 Units into the skin daily.         [facility] Discharge Instructions Carotid Endarterectomy (CEA)  Please refer to the following instructions for your post-procedure care. Your surgeon or physician assistant will discuss any changes with you.  Activity  You are encouraged to walk as much as you can. You can slowly return to normal activities but must avoid strenuous activity and heavy lifting until your doctor tell you it's OK. Avoid activities such as vacuuming or swinging a golf club. You can drive after one week if you are comfortable and you are no longer taking prescription pain medications. It is normal to feel tired for serval weeks after your surgery. It is also normal to have difficulty with sleep habits, eating, and bowel movements after surgery. These will go away with time.  Bathing/Showering  You may shower after you come home. Do not soak in a bathtub, hot tub, or swim until the incision heals completely.  Incision  Care  Shower every day. Clean your incision with mild soap and water. Pat the area dry with a clean towel. You do not need a bandage unless otherwise instructed. Do not apply any ointments or creams to your incision. You may have skin glue on your incision. Do not peel it off. It will come off on its own in about one week. Your incision may feel thickened and raised for several weeks after your surgery. This is normal and the skin will soften over time. For Men Only: It's OK to shave around the incision but do not shave the incision itself for 2 weeks. It is common to have numbness under your chin that could last for several months.  Diet  Resume your normal diet. There are no special food restrictions following this procedure. A low fat/low cholesterol diet is recommended for all patients with vascular disease. In order to heal from your surgery, it is CRITICAL to get adequate nutrition. Your body requires vitamins, minerals, and protein. Vegetables are the best source of vitamins and minerals. Vegetables also provide the perfect balance of protein. Processed food has little nutritional value, so try to avoid this.  Medications  Resume taking all of your medications unless your doctor or physician assistant tells you not to.  If your incision is causing pain, you may take over-the- counter pain relievers such as acetaminophen (Tylenol). If you were prescribed a stronger pain medication, please be aware these medications can cause nausea and constipation.  Prevent nausea by taking the medication with a snack or meal. Avoid constipation by drinking plenty of fluids and eating foods with a high amount of fiber, such as fruits, vegetables, and grains.  Do not take Tylenol if you are taking prescription pain medications.  Do NOT take Metformin until Monday 01/03/2021  Follow Up  Our office will schedule a follow up appointment 2-3 weeks following discharge.  Please call us immediately for any of the  following conditions  Increased pain, redness, drainage (pus) from your incision site. Fever of 101 degrees or higher. If you should develop stroke (slurred speech, difficulty swallowing, weakness on one side of your body, loss of vision) you should call 911 and go to the nearest emergency room.  Reduce your risk of vascular  disease:  Stop smoking. If you would like help call [facility] at 1-800-QUIT-NOW (770)405-4616) or [place] at (947) 434-7029. Manage your cholesterol Maintain a desired weight Control your diabetes Keep your blood pressure down  If you have any questions, please call the office at [phone].  Prescriptions given: 1.   Roxicet #8 No Refill 2.  Keflex '500mg'$  bid #20  Disposition: home  Patient's condition: is Good  Follow up: 1. VVS in 4 weeks with duplex   Leontine Locket, PA-C [facility] [phone]   --- For [facility] Registry use ---   Modified Rankin score at D/C (0-6): 0  IV medication needed for:  1. Hypertension: No 2. Hypotension: No  Post-op Complications: No  1. Post-op CVA or TIA: No  If yes: Event classification (right eye, left eye, right cortical, left cortical, verterobasilar, other): n/a  If yes: Timing of event (intra-op, <6 hrs post-op, >=6 hrs post-op, unknown): n/a  2. CN injury: No  If yes: CN n/a injuried   3. Myocardial infarction: No  If yes: Dx by (EKG or clinical, Troponin): n/a  4.  CHF: No  5.  Dysrhythmia (new): No  6. Wound infection: No  7. Reperfusion symptoms: No  8. Return to OR: No  If yes: return to OR for (bleeding, neurologic, other CEA incision, other): n/a  Discharge medications: Statin use:  Yes ASA use:  No   Beta blocker use:  Yes ACE-Inhibitor use:  No  ARB use:  Yes CCB use: No P2Y12 Antagonist use: Yes, [ x] Plavix, '[ ]'$  Plasugrel, '[ ]'$  Ticlopinine, '[ ]'$  Ticagrelor, '[ ]'$  Other, '[ ]'$  No for medical reason, '[ ]'$  Non-compliant, '[ ]'$  Not-indicated Anti-coagulant use:   No, '[ ]'$  Warfarin, '[ ]'$  Rivaroxaban, '[ ]'$  Dabigatran,

## 2020-12-31 NOTE — Progress Notes (Signed)
Recheck of patients potassium came back normal at 4.8 will continue to monitor

## 2020-12-31 NOTE — Discharge Instructions (Signed)
   [facility]  Discharge Instructions   Carotid Surgery  Please refer to the following instructions for your post-procedure care. Your surgeon or physician assistant will discuss any changes with you.  Activity  You are encouraged to walk as much as you can. You can slowly return to normal activities but must avoid strenuous activity and heavy lifting until your doctor tell you it's okay. Avoid activities such as vacuuming or swinging a golf club. You can drive after one week if you are comfortable and you are no longer taking prescription pain medications. It is normal to feel tired for serval weeks after your surgery. It is also normal to have difficulty with sleep habits, eating, and bowel movements after surgery. These will go away with time.  Bathing/Showering  Shower daily after you go home. Do not soak in a bathtub, hot tub, or swim until the incision heals completely.  Incision Care  Shower every day. Clean your incision with mild soap and water. Pat the area dry with a clean towel. You do not need a bandage unless otherwise instructed. Do not apply any ointments or creams to your incision. You may have skin glue on your incision. Do not peel it off. It will come off on its own in about one week. Your incision may feel thickened and raised for several weeks after your surgery. This is normal and the skin will soften over time.   For Men Only: It's okay to shave around the incision but do not shave the incision itself for 2 weeks. It is common to have numbness under your chin that could last for several months.  Diet  Resume your normal diet. There are no special food restrictions following this procedure. A low fat/low cholesterol diet is recommended for all patients with vascular disease. In order to heal from your surgery, it is CRITICAL to get adequate nutrition. Your body requires vitamins, minerals, and protein. Vegetables are the best source of  vitamins and minerals. Vegetables also provide the perfect balance of protein. Processed food has little nutritional value, so try to avoid this.  Medications  Resume taking all of your medications unless your doctor or physician assistant tells you not to. If your incision is causing pain, you may take over-the- counter pain relievers such as acetaminophen (Tylenol). If you were prescribed a stronger pain medication, please be aware these medications can cause nausea and constipation. Prevent nausea by taking the medication with a snack or meal. Avoid constipation by drinking plenty of fluids and eating foods with a high amount of fiber, such as fruits, vegetables, and grains.  Do not take Tylenol if you are taking prescription pain medications.  Do NOT take Metformin until Monday 01/03/2021  Follow Up  Our office will schedule a follow up appointment 2-3 weeks following discharge.  Please call us immediately for any of the following conditions  Increased pain, redness, drainage (pus) from your incision site. Fever of 101 degrees or higher. If you should develop stroke (slurred speech, difficulty swallowing, weakness on one side of your body, loss of vision) you should call 911 and go to the nearest emergency room.  Reduce your risk of vascular disease:  Stop smoking. If you would like help call [facility] at 1-800-QUIT-NOW (504)559-7495) or [place] at [phone]. Manage your cholesterol Maintain a desired weight Control your diabetes Keep your blood pressure down  If you have any questions, please call the office at [phone].

## 2020-12-31 NOTE — Progress Notes (Signed)
Lab called with critical Potassium 6.3 paged Dr Stanford Breed who ordered repeat Bmet stat

## 2020-12-31 NOTE — Progress Notes (Signed)
Pt ambulated in hall x 470 feet indecently on room air oxygen saturations 96-99%. Pt sitting in chair with call bell within reach

## 2020-12-31 NOTE — Progress Notes (Addendum)
  Progress Note    12/31/2020 7:07 AM 1 Day Post-Op  Subjective:  no complaints.  Says he has walked and voided.  No difficulty swallowing.  Says this was easier than the last surgery.  Afebrile HR 50's-80's  [id number] systolic [id number] RA  Gtts:  none Vitals:   12/31/20 0200 12/31/20 0502  BP: 136/71 (!) 155/75  Pulse: (!) 59 74  Resp: 15 16  Temp:  98 F (36.7 C)  SpO2: 96% 100%     Physical Exam: Neuro:  in tact Lungs:  non labored Incision:  clean and dry  CBC    Component Value Date/Time   WBC 10.7 (H) 12/31/2020 0154   RBC 4.77 12/31/2020 0154   HGB 14.9 12/31/2020 0154   HGB 15.6 10/19/2020 1031   HCT 44.6 12/31/2020 0154   HCT 47.6 10/19/2020 1031   PLT 235 12/31/2020 0154   PLT 242 10/19/2020 1031   MCV 93.5 12/31/2020 0154   MCV 92 10/19/2020 1031   MCH 31.2 12/31/2020 0154   MCHC 33.4 12/31/2020 0154   RDW 14.3 12/31/2020 0154   RDW 17.1 (H) 10/19/2020 1031   LYMPHSABS 1.7 10/19/2020 1031   MONOABS 1.0 06/03/2020 2200   EOSABS 0.2 10/19/2020 1031   BASOSABS 0.1 10/19/2020 1031    BMET    Component Value Date/Time   NA 134 (L) 12/31/2020 0414   NA 137 10/19/2020 1031   K 4.8 12/31/2020 0414   CL 103 12/31/2020 0414   CO2 22 12/31/2020 0414   GLUCOSE 196 (H) 12/31/2020 0414   BUN 24 (H) 12/31/2020 0414   BUN 28 (H) 10/19/2020 1031   CREATININE 1.24 12/31/2020 0414   CALCIUM 8.8 (L) 12/31/2020 0414   GFRNONAA >60 12/31/2020 0414   GFRAA >60 12/09/2019 [id number]     Intake/Output Summary (Last 24 hours) at 12/31/2020 0707 Last data filed at 12/31/2020 0600 Gross per 24 hour  Intake 1918.05 ml  Output 2250 ml  Net -331.95 ml     Assessment/Plan:  This is a 60 y.o. male who is s/p left TCAR 1 Day Post-Op  -pt is doing well this am. -pt neuro exam is in tact -pt has ambulated -pt has voided -f/u with [facility] in 4 weeks with carotid duplex   Leontine Locket, PA-C [facility] [phone]  VASCULAR STAFF ADDENDUM: I  have independently interviewed and examined the patient. I agree with the above.  Looks great POD#1 L TCAR. Rx Keflex for drainage from R neck. Follow up 1 month with duplex.  Yevonne Aline. Stanford Breed, MD [facility] Phone Number: [phone] 12/31/2020 9:43 AM

## 2021-01-02 ENCOUNTER — Other Ambulatory Visit: Payer: Self-pay

## 2021-01-02 ENCOUNTER — Encounter (HOSPITAL_COMMUNITY): Payer: Self-pay | Admitting: Surgery

## 2021-01-02 DIAGNOSIS — I6523 Occlusion and stenosis of bilateral carotid arteries: Secondary | ICD-10-CM

## 2021-01-09 ENCOUNTER — Ambulatory Visit (INDEPENDENT_AMBULATORY_CARE_PROVIDER_SITE_OTHER): Payer: Medicaid Other | Admitting: Physician Assistant

## 2021-01-09 ENCOUNTER — Ambulatory Visit (HOSPITAL_COMMUNITY)
Admission: RE | Admit: 2021-01-09 | Discharge: 2021-01-09 | Disposition: A | Discharge status: Home / Self Care | Payer: Medicaid Other | Source: Ambulatory Visit | Attending: Surgery | Admitting: Surgery

## 2021-01-09 ENCOUNTER — Other Ambulatory Visit: Payer: Self-pay

## 2021-01-09 DIAGNOSIS — I739 Peripheral vascular disease, unspecified: Secondary | ICD-10-CM

## 2021-01-09 DIAGNOSIS — I6523 Occlusion and stenosis of bilateral carotid arteries: Secondary | ICD-10-CM

## 2021-01-09 NOTE — Progress Notes (Signed)
POST OPERATIVE OFFICE NOTE    CC:  F/u for surgery  HPI:  This is a 60 y.o. male who is s/p bilateral TCAR for symptomatic right ICA stenosis on 12/01/2020 and asymptomatic left ICA stenosis on 12/30/2020 both by Dr. Trula Slade.  He was placed on Keflex '500mg'$  bid at discharged on 9/10 for stitch abscess on the right.    He has hx of claudication in the right leg as well as a wound in 2016.  ABI on the right was 0.34 and normal on the left.  On 01/17/2015 he underwent angiography revealing a distal superficial femoral and proximal popliteal artery occlusion which was successfully recanalized and stented using overlapping 6 mm Cordis stents.  At the same time the right common iliac artery was treated using an 8 x 39 balloon expandable stent.  The ulcer healed.  He then presented in 2017 with recurrent symptoms of claudication at less than 50 feet.  He underwent angiography on 12/20/2015 and was found to have several areas of stenosis within the stent which were successfully treated with drug-coated balloon angioplasty.  Dr. Trula Slade saw him in early August for lifestyle limiting claudication but no rest pain or open wounds.  His left leg worse than right.  Plan was to do bilateral TCAR and once recovered undergo BLE arteriogram with possible intervention.  Pt returns today for follow up.  Pt states he is doing well.  He has not had any issues from surgery.  He continues to take his asa/plavix/statin.  He is also on Eliquis.  He states that he has trouble walking to his mailbox, which is about 75-100 yards before having to stop to rest.    Allergies  Allergen Reactions   Codeine Shortness Of Breath and Itching   Hydrocodone Shortness Of Breath, Nausea Only and Rash    Breathing problems ALSO   Gabapentin Other (See Comments)    Suicidal thoughts, extreme dreams   Pregabalin Other (See Comments)    Suicidal thoughts, extreme dreams    Simvastatin Other (See Comments)    Pain in joints   Lisinopril  Itching and Rash   Venlafaxine Rash    Current Outpatient Medications  Medication Sig Dispense Refill   apixaban (ELIQUIS) 5 MG TABS tablet Take 5 mg by mouth 2 (two) times daily.     atorvastatin (LIPITOR) 40 MG tablet Take 40 mg by mouth daily.     carvedilol (COREG) 25 MG tablet Take 25 mg by mouth 2 (two) times daily with a meal.     cephALEXin (KEFLEX) 500 MG capsule Take 1 capsule (500 mg total) by mouth 2 (two) times daily. 20 capsule 0   clopidogrel (PLAVIX) 75 MG tablet Take 1 tablet (75 mg total) by mouth daily. 30 tablet 11   Dulaglutide 1.5 MG/0.5ML SOPN Inject 1.5 mg into the skin every Wednesday.     empagliflozin (JARDIANCE) 25 MG TABS tablet Take 1 tablet (25 mg total) by mouth daily before breakfast. 30 tablet    ezetimibe (ZETIA) 10 MG tablet Take 1 tablet (10 mg total) by mouth daily. 30 tablet 2   furosemide (LASIX) 40 MG tablet Take 40 mg by mouth daily.     metFORMIN (GLUCOPHAGE) 1000 MG tablet Take 1,000 mg by mouth 2 (two) times daily.     oxyCODONE-acetaminophen (PERCOCET) 5-325 MG tablet Take 1 tablet by mouth every 6 (six) hours as needed for severe pain. 8 tablet 0   OXYGEN Inhale 3 L/min into the lungs at bedtime  as needed (for shortness of breath).     sacubitril-valsartan (ENTRESTO) 97-103 MG Take 1 tablet by mouth 2 (two) times daily. 180 tablet 3   spironolactone (ALDACTONE) 25 MG tablet Take 25 mg by mouth daily.     TRESIBA FLEXTOUCH 100 UNIT/ML FlexTouch Pen Inject 30 Units into the skin daily.     No current facility-administered medications for this visit.     ROS:  See HPI  Physical Exam:  Incision:  bilateral neck incisions have healed nicely.  Extremities:  moving all extremities equally Neuro: in tact.    Assessment/Plan:  This is a 60 y.o. male who is s/p: Bilateral TCAR  -pt doing well from surgical standpoint and neuro in tact.  His u/s today has <50% stenosis bilaterally.  He has not had any neurologic events since discharge.  Will  have him return in 9 months for carotid duplex.  He knows to call sooner if there are any issues.   PAD with lifestyle limiting claudication with LLE worse than RLE -Dr. Trula Slade wanted pt to recover from [facility] and pt looks good today.  Will plan for arteriogram next week by Dr. Trula Slade to evaluate LLE.  He is on Eliquis.    Leontine Locket, [facility] [phone]   Clinic MD:  pt seen with Dr. Trula Slade

## 2021-01-09 NOTE — H&P (View-Only) (Signed)
POST OPERATIVE OFFICE NOTE    CC:  F/u for surgery  HPI:  This is a 60 y.o. male who is s/p bilateral TCAR for symptomatic right ICA stenosis on 12/01/2020 and asymptomatic left ICA stenosis on 12/30/2020 both by Dr. Trula Slade.  He was placed on Keflex '500mg'$  bid at discharged on 9/10 for stitch abscess on the right.    He has hx of claudication in the right leg as well as a wound in 2016.  ABI on the right was 0.34 and normal on the left.  On 01/17/2015 he underwent angiography revealing a distal superficial femoral and proximal popliteal artery occlusion which was successfully recanalized and stented using overlapping 6 mm Cordis stents.  At the same time the right common iliac artery was treated using an 8 x 39 balloon expandable stent.  The ulcer healed.  He then presented in 2017 with recurrent symptoms of claudication at less than 50 feet.  He underwent angiography on 12/20/2015 and was found to have several areas of stenosis within the stent which were successfully treated with drug-coated balloon angioplasty.  Dr. Trula Slade saw him in early August for lifestyle limiting claudication but no rest pain or open wounds.  His left leg worse than right.  Plan was to do bilateral TCAR and once recovered undergo BLE arteriogram with possible intervention.  Pt returns today for follow up.  Pt states he is doing well.  He has not had any issues from surgery.  He continues to take his asa/plavix/statin.  He is also on Eliquis.  He states that he has trouble walking to his mailbox, which is about 75-100 yards before having to stop to rest.    Allergies  Allergen Reactions   Codeine Shortness Of Breath and Itching   Hydrocodone Shortness Of Breath, Nausea Only and Rash    Breathing problems ALSO   Gabapentin Other (See Comments)    Suicidal thoughts, extreme dreams   Pregabalin Other (See Comments)    Suicidal thoughts, extreme dreams    Simvastatin Other (See Comments)    Pain in joints   Lisinopril  Itching and Rash   Venlafaxine Rash    Current Outpatient Medications  Medication Sig Dispense Refill   apixaban (ELIQUIS) 5 MG TABS tablet Take 5 mg by mouth 2 (two) times daily.     atorvastatin (LIPITOR) 40 MG tablet Take 40 mg by mouth daily.     carvedilol (COREG) 25 MG tablet Take 25 mg by mouth 2 (two) times daily with a meal.     cephALEXin (KEFLEX) 500 MG capsule Take 1 capsule (500 mg total) by mouth 2 (two) times daily. 20 capsule 0   clopidogrel (PLAVIX) 75 MG tablet Take 1 tablet (75 mg total) by mouth daily. 30 tablet 11   Dulaglutide 1.5 MG/0.5ML SOPN Inject 1.5 mg into the skin every Wednesday.     empagliflozin (JARDIANCE) 25 MG TABS tablet Take 1 tablet (25 mg total) by mouth daily before breakfast. 30 tablet    ezetimibe (ZETIA) 10 MG tablet Take 1 tablet (10 mg total) by mouth daily. 30 tablet 2   furosemide (LASIX) 40 MG tablet Take 40 mg by mouth daily.     metFORMIN (GLUCOPHAGE) 1000 MG tablet Take 1,000 mg by mouth 2 (two) times daily.     oxyCODONE-acetaminophen (PERCOCET) 5-325 MG tablet Take 1 tablet by mouth every 6 (six) hours as needed for severe pain. 8 tablet 0   OXYGEN Inhale 3 L/min into the lungs at bedtime  as needed (for shortness of breath).     sacubitril-valsartan (ENTRESTO) 97-103 MG Take 1 tablet by mouth 2 (two) times daily. 180 tablet 3   spironolactone (ALDACTONE) 25 MG tablet Take 25 mg by mouth daily.     TRESIBA FLEXTOUCH 100 UNIT/ML FlexTouch Pen Inject 30 Units into the skin daily.     No current facility-administered medications for this visit.     ROS:  See HPI  Physical Exam:  Incision:  bilateral neck incisions have healed nicely.  Extremities:  moving all extremities equally Neuro: in tact.    Assessment/Plan:  This is a 60 y.o. male who is s/p: Bilateral TCAR  -pt doing well from surgical standpoint and neuro in tact.  His u/s today has <50% stenosis bilaterally.  He has not had any neurologic events since discharge.  Will  have him return in 9 months for carotid duplex.  He knows to call sooner if there are any issues.   PAD with lifestyle limiting claudication with LLE worse than RLE -Dr. Trula Slade wanted pt to recover from [facility] and pt looks good today.  Will plan for arteriogram next week by Dr. Trula Slade to evaluate LLE.  He is on Eliquis.    Leontine Locket, [facility] [phone]   Clinic MD:  pt seen with Dr. Trula Slade

## 2021-01-12 ENCOUNTER — Other Ambulatory Visit: Payer: Self-pay

## 2021-01-12 DIAGNOSIS — I6523 Occlusion and stenosis of bilateral carotid arteries: Secondary | ICD-10-CM

## 2021-01-16 ENCOUNTER — Encounter (HOSPITAL_COMMUNITY): Payer: Self-pay | Admitting: Surgery

## 2021-01-17 ENCOUNTER — Encounter (HOSPITAL_COMMUNITY)
Admission: RE | Disposition: A | Discharge status: Home / Self Care | Payer: Self-pay | Source: Home / Self Care | Attending: Surgery

## 2021-01-17 ENCOUNTER — Ambulatory Visit (HOSPITAL_COMMUNITY)
Admission: RE | Admit: 2021-01-17 | Discharge: 2021-01-17 | Disposition: A | Discharge status: Home / Self Care | Payer: Medicaid Other | Attending: Surgery | Admitting: Surgery

## 2021-01-17 ENCOUNTER — Other Ambulatory Visit: Payer: Self-pay

## 2021-01-17 DIAGNOSIS — I70213 Atherosclerosis of native arteries of extremities with intermittent claudication, bilateral legs: Secondary | ICD-10-CM | POA: Insufficient documentation

## 2021-01-17 DIAGNOSIS — I701 Atherosclerosis of renal artery: Secondary | ICD-10-CM | POA: Diagnosis not present

## 2021-01-17 HISTORY — PX: ABDOMINAL AORTOGRAM W/LOWER EXTREMITY: CATH118223

## 2021-01-17 HISTORY — PX: PERIPHERAL VASCULAR INTERVENTION: CATH118257

## 2021-01-17 LAB — POCT I-STAT, CHEM 8
BUN: 26 mg/dL — ABNORMAL HIGH (ref 6–20)
Calcium, Ion: 1.27 mmol/L (ref 1.15–1.40)
Chloride: 101 mmol/L (ref 98–111)
Creatinine, Ser: 1.1 mg/dL (ref 0.61–1.24)
Glucose, Bld: 236 mg/dL — ABNORMAL HIGH (ref 70–99)
HCT: 48 % (ref 39.0–52.0)
Hemoglobin: 16.3 g/dL (ref 13.0–17.0)
Potassium: 4.1 mmol/L (ref 3.5–5.1)
Sodium: 140 mmol/L (ref 135–145)
TCO2: 26 mmol/L (ref 22–32)

## 2021-01-17 LAB — GLUCOSE, CAPILLARY
Glucose-Capillary: 140 mg/dL — ABNORMAL HIGH (ref 70–99)
Glucose-Capillary: 119 mg/dL — ABNORMAL HIGH (ref 70–99)

## 2021-01-17 LAB — POCT ACTIVATED CLOTTING TIME: Activated Clotting Time: 237 seconds

## 2021-01-17 SURGERY — ABDOMINAL AORTOGRAM W/LOWER EXTREMITY
Anesthesia: LOCAL

## 2021-01-17 MED ORDER — MORPHINE SULFATE (PF) 2 MG/ML IV SOLN
2.0000 mg | INTRAVENOUS | Status: DC | PRN
  Administered 2021-01-17 (×2): 2 mg via INTRAVENOUS
  Filled 2021-01-17: qty 1

## 2021-01-17 MED ORDER — LIDOCAINE HCL (PF) 1 % IJ SOLN
INTRAMUSCULAR | Status: DC | PRN
  Administered 2021-01-17: 15 mL via INTRADERMAL

## 2021-01-17 MED ORDER — SODIUM CHLORIDE 0.9% FLUSH: 3.0000 mL | INTRAVENOUS | Status: DC | PRN

## 2021-01-17 MED ORDER — SODIUM CHLORIDE 0.9% FLUSH: 3.0000 mL | Freq: Two times a day (BID) | INTRAVENOUS | Status: DC

## 2021-01-17 MED ORDER — SODIUM CHLORIDE 0.9 % IV SOLN: INTRAVENOUS | Status: DC

## 2021-01-17 MED ORDER — CLOPIDOGREL BISULFATE 75 MG PO TABS: 75.0000 mg | ORAL_TABLET | Freq: Every day | ORAL | 11 refills | Status: DC

## 2021-01-17 MED ORDER — HEPARIN SODIUM (PORCINE) 1000 UNIT/ML IJ SOLN
INTRAMUSCULAR | Status: AC
  Filled 2021-01-17: qty 1

## 2021-01-17 MED ORDER — SODIUM CHLORIDE 0.9 % WEIGHT BASED INFUSION: 1.0000 mL/kg/h | INTRAVENOUS | Status: DC

## 2021-01-17 MED ORDER — MIDAZOLAM HCL 2 MG/2ML IJ SOLN
INTRAMUSCULAR | Status: AC
  Filled 2021-01-17: qty 2

## 2021-01-17 MED ORDER — HYDRALAZINE HCL 20 MG/ML IJ SOLN
INTRAMUSCULAR | Status: AC
  Filled 2021-01-17: qty 1

## 2021-01-17 MED ORDER — HYDRALAZINE HCL 20 MG/ML IJ SOLN
5.0000 mg | INTRAMUSCULAR | Status: DC | PRN
  Administered 2021-01-17: 5 mg via INTRAVENOUS

## 2021-01-17 MED ORDER — MORPHINE SULFATE (PF) 2 MG/ML IV SOLN
INTRAVENOUS | Status: AC
  Filled 2021-01-17: qty 1

## 2021-01-17 MED ORDER — FENTANYL CITRATE (PF) 100 MCG/2ML IJ SOLN
INTRAMUSCULAR | Status: DC | PRN
  Administered 2021-01-17: 50 ug via INTRAVENOUS
  Administered 2021-01-17: 25 ug via INTRAVENOUS

## 2021-01-17 MED ORDER — HEPARIN (PORCINE) IN NACL 1000-0.9 UT/500ML-% IV SOLN
INTRAVENOUS | Status: AC
  Filled 2021-01-17: qty 1000

## 2021-01-17 MED ORDER — IODIXANOL 320 MG/ML IV SOLN
INTRAVENOUS | Status: DC | PRN
  Administered 2021-01-17: 130 mL via INTRA_ARTERIAL

## 2021-01-17 MED ORDER — HEPARIN (PORCINE) IN NACL 1000-0.9 UT/500ML-% IV SOLN
INTRAVENOUS | Status: DC | PRN
  Administered 2021-01-17 (×2): 500 mL

## 2021-01-17 MED ORDER — LABETALOL HCL 5 MG/ML IV SOLN
10.0000 mg | INTRAVENOUS | Status: DC | PRN
Start: 2021-01-17 — End: 2021-01-17

## 2021-01-17 MED ORDER — FENTANYL CITRATE (PF) 100 MCG/2ML IJ SOLN
INTRAMUSCULAR | Status: AC
  Filled 2021-01-17: qty 2

## 2021-01-17 MED ORDER — MIDAZOLAM HCL 2 MG/2ML IJ SOLN
INTRAMUSCULAR | Status: DC | PRN
  Administered 2021-01-17: 2 mg via INTRAVENOUS
  Administered 2021-01-17: 1 mg via INTRAVENOUS

## 2021-01-17 MED ORDER — HEPARIN SODIUM (PORCINE) 1000 UNIT/ML IJ SOLN
INTRAMUSCULAR | Status: DC | PRN
  Administered 2021-01-17: 10000 [IU] via INTRAVENOUS

## 2021-01-17 MED ORDER — LIDOCAINE HCL (PF) 1 % IJ SOLN
INTRAMUSCULAR | Status: AC
  Filled 2021-01-17: qty 30

## 2021-01-17 MED ORDER — CLOPIDOGREL BISULFATE 75 MG PO TABS: 75.0000 mg | ORAL_TABLET | Freq: Every day | ORAL | Status: DC

## 2021-01-17 MED ORDER — SODIUM CHLORIDE 0.9 % IV SOLN: 250.0000 mL | INTRAVENOUS | Status: DC | PRN

## 2021-01-17 MED ORDER — ACETAMINOPHEN 325 MG PO TABS: 650.0000 mg | ORAL_TABLET | ORAL | Status: DC | PRN

## 2021-01-17 SURGICAL SUPPLY — 19 items
BALLN MUSTANG 5X100X135 (BALLOONS) ×3
BALLOON MUSTANG 5X100X135 (BALLOONS) IMPLANT
CATH ANGIO 5F PIGTAIL 65CM (CATHETERS) ×1 IMPLANT
CATH CROSS OVER TEMPO 5F (CATHETERS) ×1 IMPLANT
DEVICE CONTINUOUS FLUSH (MISCELLANEOUS) ×1 IMPLANT
KIT ENCORE 26 ADVANTAGE (KITS) ×1 IMPLANT
KIT MICROPUNCTURE NIT STIFF (SHEATH) ×1 IMPLANT
KIT PV (KITS) ×3 IMPLANT
SHEATH PINNACLE 5F 10CM (SHEATH) ×1 IMPLANT
SHEATH PINNACLE 6F 10CM (SHEATH) ×1 IMPLANT
SHEATH PINNACLE ST 6F 65CM (SHEATH) ×1 IMPLANT
SHEATH PROBE COVER 6X72 (BAG) ×1 IMPLANT
STENT BIOMIMICS 5X80 (Permanent Stent) ×1 IMPLANT
STENT ELUVIA 6X80X130 (Permanent Stent) ×1 IMPLANT
SYR MEDRAD MARK V 150ML (SYRINGE) ×1 IMPLANT
TRANSDUCER W/STOPCOCK (MISCELLANEOUS) ×3 IMPLANT
TRAY PV CATH (CUSTOM PROCEDURE TRAY) ×3 IMPLANT
WIRE HI TORQ VERSACORE 300 (WIRE) ×1 IMPLANT
WIRE STARTER BENTSON 035X150 (WIRE) ×1 IMPLANT

## 2021-01-17 NOTE — Discharge Instructions (Signed)
Start Eliquis back on 9/29

## 2021-01-17 NOTE — Op Note (Signed)
Patient name: [name] MRN: [id number] DOB: 10/13/1960 Sex: male  01/17/2021 Pre-operative Diagnosis: Severe bilateral lower extremity claudication Post-operative diagnosis:  Same Surgeon:  [name] Procedure Performed:  1.  Ultrasound-guided access, right femoral artery  2.  Abdominal aortogram  3.  Bilateral lower extremity runoff  4.  Stent, left superficial femoral and popliteal artery  5.  Conscious sedation, 54 minutes     Indications: This is a 60 year old gentleman with severe claudication left greater than right.  He has previously undergone intervention on the right leg.  He recently is status post bilateral TCAR.  He comes in today for further evaluation of his legs.  Procedure:  The patient was identified in the holding area and taken to room 8.  The patient was then placed supine on the table and prepped and draped in the usual sterile fashion.  A time out was called.  Conscious sedation was administered with the use of IV fentanyl and Versed under continuous physician and nurse monitoring.  Heart rate, blood pressure, and oxygen saturation were continuously monitored.  Total sedation time was 54 minutes.  Ultrasound was used to evaluate the right common femoral artery.  It was patent .  A digital ultrasound image was acquired.  A micropuncture needle was used to access the right common femoral artery under ultrasound guidance.  An 018 wire was advanced without resistance and a micropuncture sheath was placed.  The 018 wire was removed and a benson wire was placed.  The micropuncture sheath was exchanged for a 5 french sheath.  An omniflush catheter was advanced over the wire to the level of L-1.  An abdominal angiogram was obtained.  Next, the cath was pulled out of aorta bifurcation and bilateral runoff was performed. Findings:   Aortogram: Approximate 50% bilateral renal artery stenosis.  The infrarenal abdominal aorta is widely patent.  Bilateral common and  external iliac arteries are patent without significant stenosis.  Iliac stent on the right is widely patent.  Right Lower Extremity: The right common femoral and fundal femoral artery are small in caliber but patent throughout their course.  The superficial femoral artery is patent.  There is in-stent stenosis within the adductor canal and above-knee popliteal artery stent.  There is two-vessel runoff via the peroneal and posterior tibial artery.  Left Lower Extremity: The left common femoral is mildly diseased without focal stenosis.  The profundofemoral artery is patent throughout its course.  The superficial femoral artery is small in caliber.  At the adductor canal there are multiple lesions greater than 60%.  The caliber of the artery continues to be small until approximately 1 cm below the joint space where it gets back to normal size.  There is single-vessel runoff via the posterior tibial artery.  Intervention: After the above images were acquired the decision was made to proceed with intervention.  The aortic bifurcation was crossed and a 6 French 45 cm sheath was advanced into the left superficial femoral artery.  The patient was fully heparinized.  I contemplated atherectomy, however with single-vessel runoff I elected to just primarily stent even though this would require going below the joint space, it would not take away the option of a below-knee popliteal bypass graft.  A versa core wire was used to cross the lesion into the posterior tibial artery.  I first placed a 5 x 80 biomimics stent and overlapped this with a 6 x 80 Elluvia stent.  Both were postdilated with a 5  mm balloon.  Completion imaging showed resolution of the stenosis with preservation of single-vessel runoff.  I elected not to close the groin because of the disease in the groin and the body habitus.  The patient taken the holding area for sheath pull once his coagulation profile corrects  Impression:  #1 bilateral greater than  50% renal artery stenosis  #2 severe multifocal lesions greater than 70% in the left popliteal artery beginning 1 cm below the joint space and extending up to the adductor canal.  These were stented using a 5 x 80 Biomimics stent distally and overlap with a 6 x 80 Elluvia  #3 in-stent stenosis on the right  #4 bilateral renal artery stenosis    V. [name], M.D., [facility] [place] Office: [phone] Pager:  [phone]

## 2021-01-17 NOTE — Interval H&P Note (Signed)
History and Physical Interval Note:  01/17/2021 8:29 AM  [name]  has presented today for surgery, with the diagnosis of claudication.  The various methods of treatment have been discussed with the patient and family. After consideration of risks, benefits and other options for treatment, the patient has consented to  Procedure(s): ABDOMINAL AORTOGRAM W/LOWER EXTREMITY (N/A) as a surgical intervention.  The patient's history has been reviewed, patient examined, no change in status, stable for surgery.  I have reviewed the patient's chart and labs.  Questions were answered to the patient's satisfaction.     [name]

## 2021-01-17 NOTE — Progress Notes (Addendum)
Pt ambulated without difficulty or bleeding.   Discharged home with his ex-wife who will drive and stay with pt x 24 hrs.

## 2021-01-17 NOTE — Progress Notes (Addendum)
Site area: Right groin a 6 french arterial sheath was removed  Site Prior to Removal:  Level 0  Pressure Applied For 20 MINUTES    Bedrest Beginning at 1345pm X 4 hours  Manual:   Yes.    Patient Status During Pull:  stable  Post Pull Groin Site:  Level 0  Post Pull Instructions Given:  Yes.    Post Pull Pulses Present:  Yes.    Dressing Applied:  Yes.    Comments:

## 2021-01-18 ENCOUNTER — Other Ambulatory Visit: Payer: Self-pay

## 2021-01-18 ENCOUNTER — Encounter (HOSPITAL_COMMUNITY): Payer: Self-pay | Admitting: Surgery

## 2021-01-19 LAB — POCT ACTIVATED CLOTTING TIME
Activated Clotting Time: 196 seconds
Activated Clotting Time: 196 seconds
Activated Clotting Time: 161 seconds

## 2021-01-25 LAB — CUP PACEART REMOTE DEVICE CHECK
Battery Voltage: 3.12 V
Brady Statistic RV Percent Paced: 0 %
Date Time Interrogation Session: 20221005092057
HighPow Impedance: 78 Ohm
Implantable Lead Implant Date: 20220706
Implantable Lead Location: 753860
Implantable Lead Model: 436909
Implantable Lead Serial Number: 81441117
Implantable Pulse Generator Implant Date: 20220706
Lead Channel Impedance Value: 716 Ohm
Lead Channel Pacing Threshold Amplitude: 0.5 V
Lead Channel Pacing Threshold Pulse Width: 0.4 ms
Lead Channel Sensing Intrinsic Amplitude: 19.6 mV
Lead Channel Sensing Intrinsic Amplitude: 5.6 mV
Lead Channel Setting Pacing Amplitude: 3 V
Lead Channel Setting Pacing Pulse Width: 0.4 ms
Lead Channel Setting Sensing Sensitivity: 0.8 mV
Pulse Gen Model: 429525
Pulse Gen Serial Number: 84841030

## 2021-01-26 ENCOUNTER — Ambulatory Visit (INDEPENDENT_AMBULATORY_CARE_PROVIDER_SITE_OTHER): Payer: Medicaid Other

## 2021-01-26 DIAGNOSIS — I480 Paroxysmal atrial fibrillation: Secondary | ICD-10-CM | POA: Diagnosis not present

## 2021-02-01 IMAGING — CR DG CHEST 2V
2 series · 2 of 2 positions shown · non-contrast
Comparison: None.

CLINICAL DATA: Short of breath.

EXAM:
CHEST - 2 VIEW

[w chest pa]
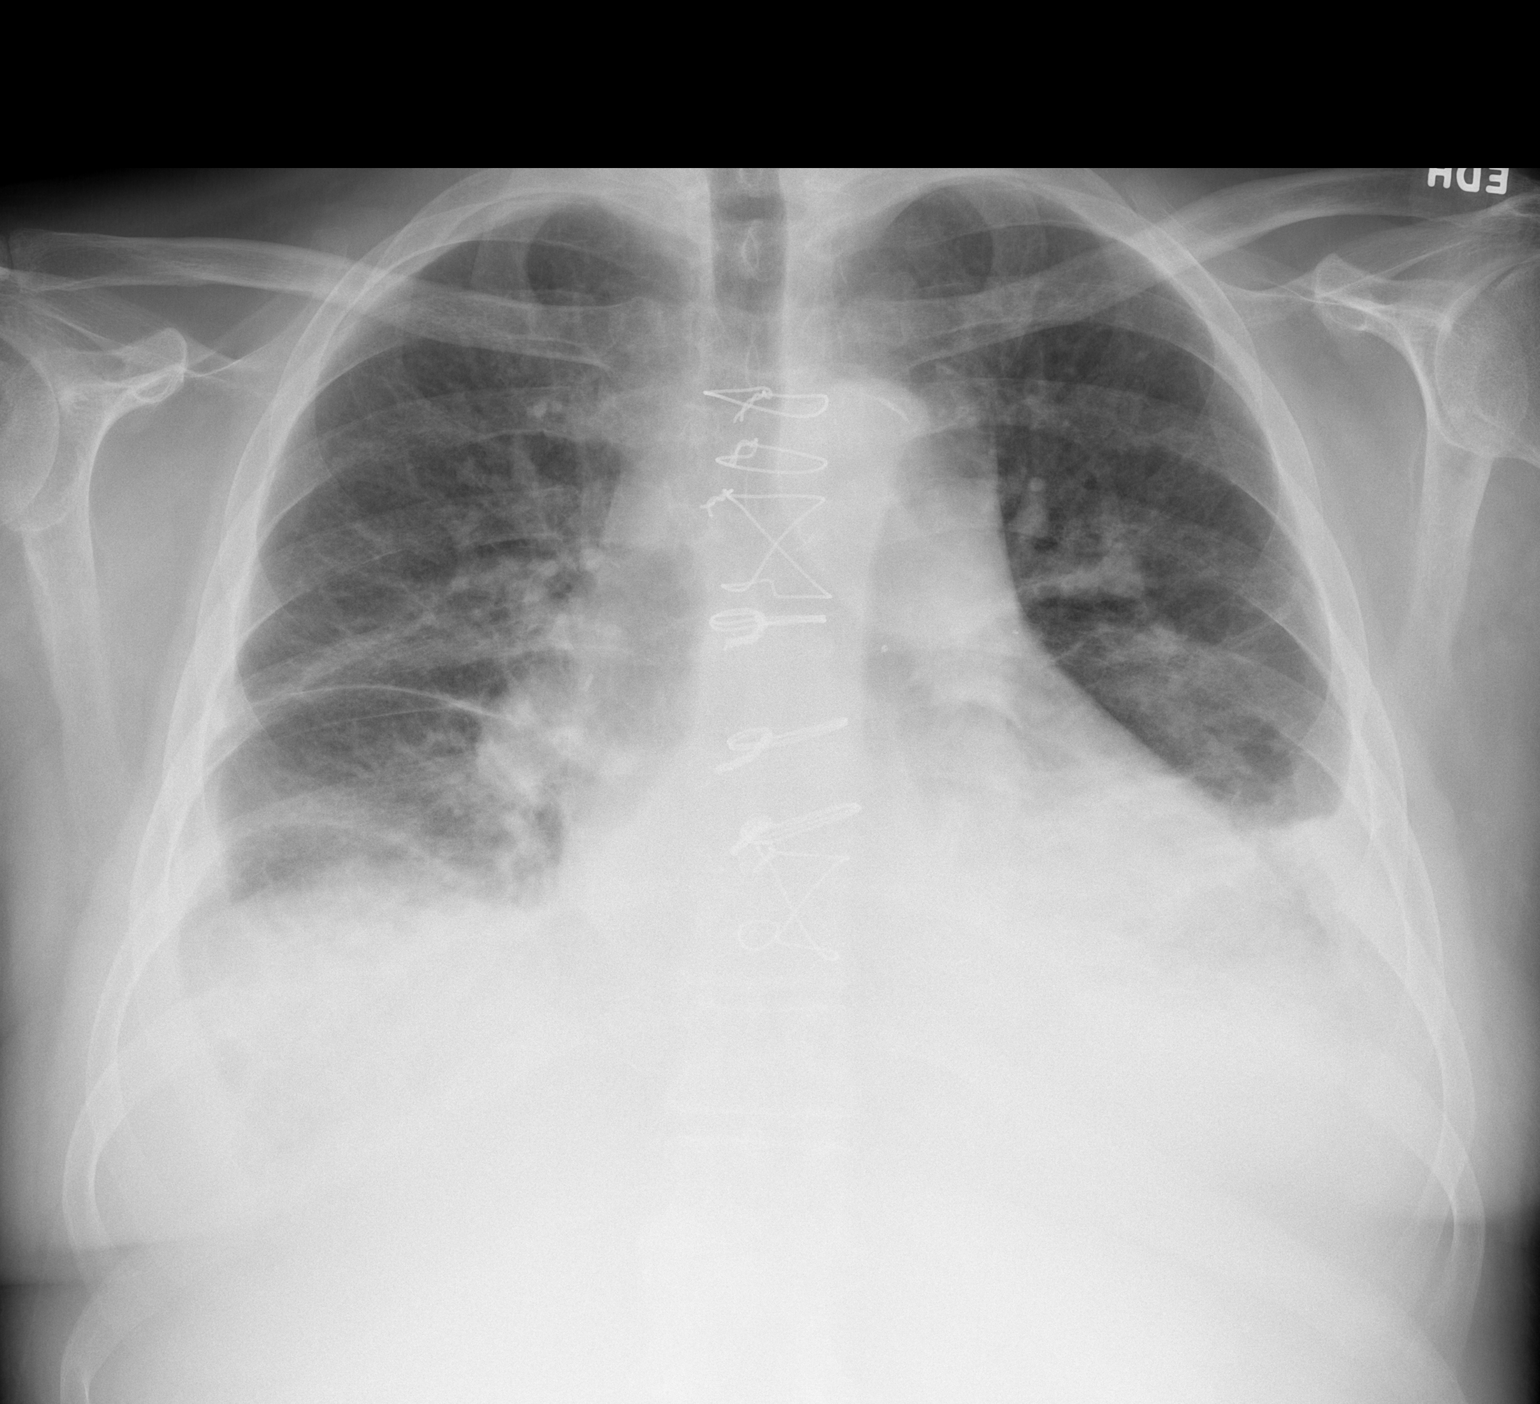

[w chest lat]
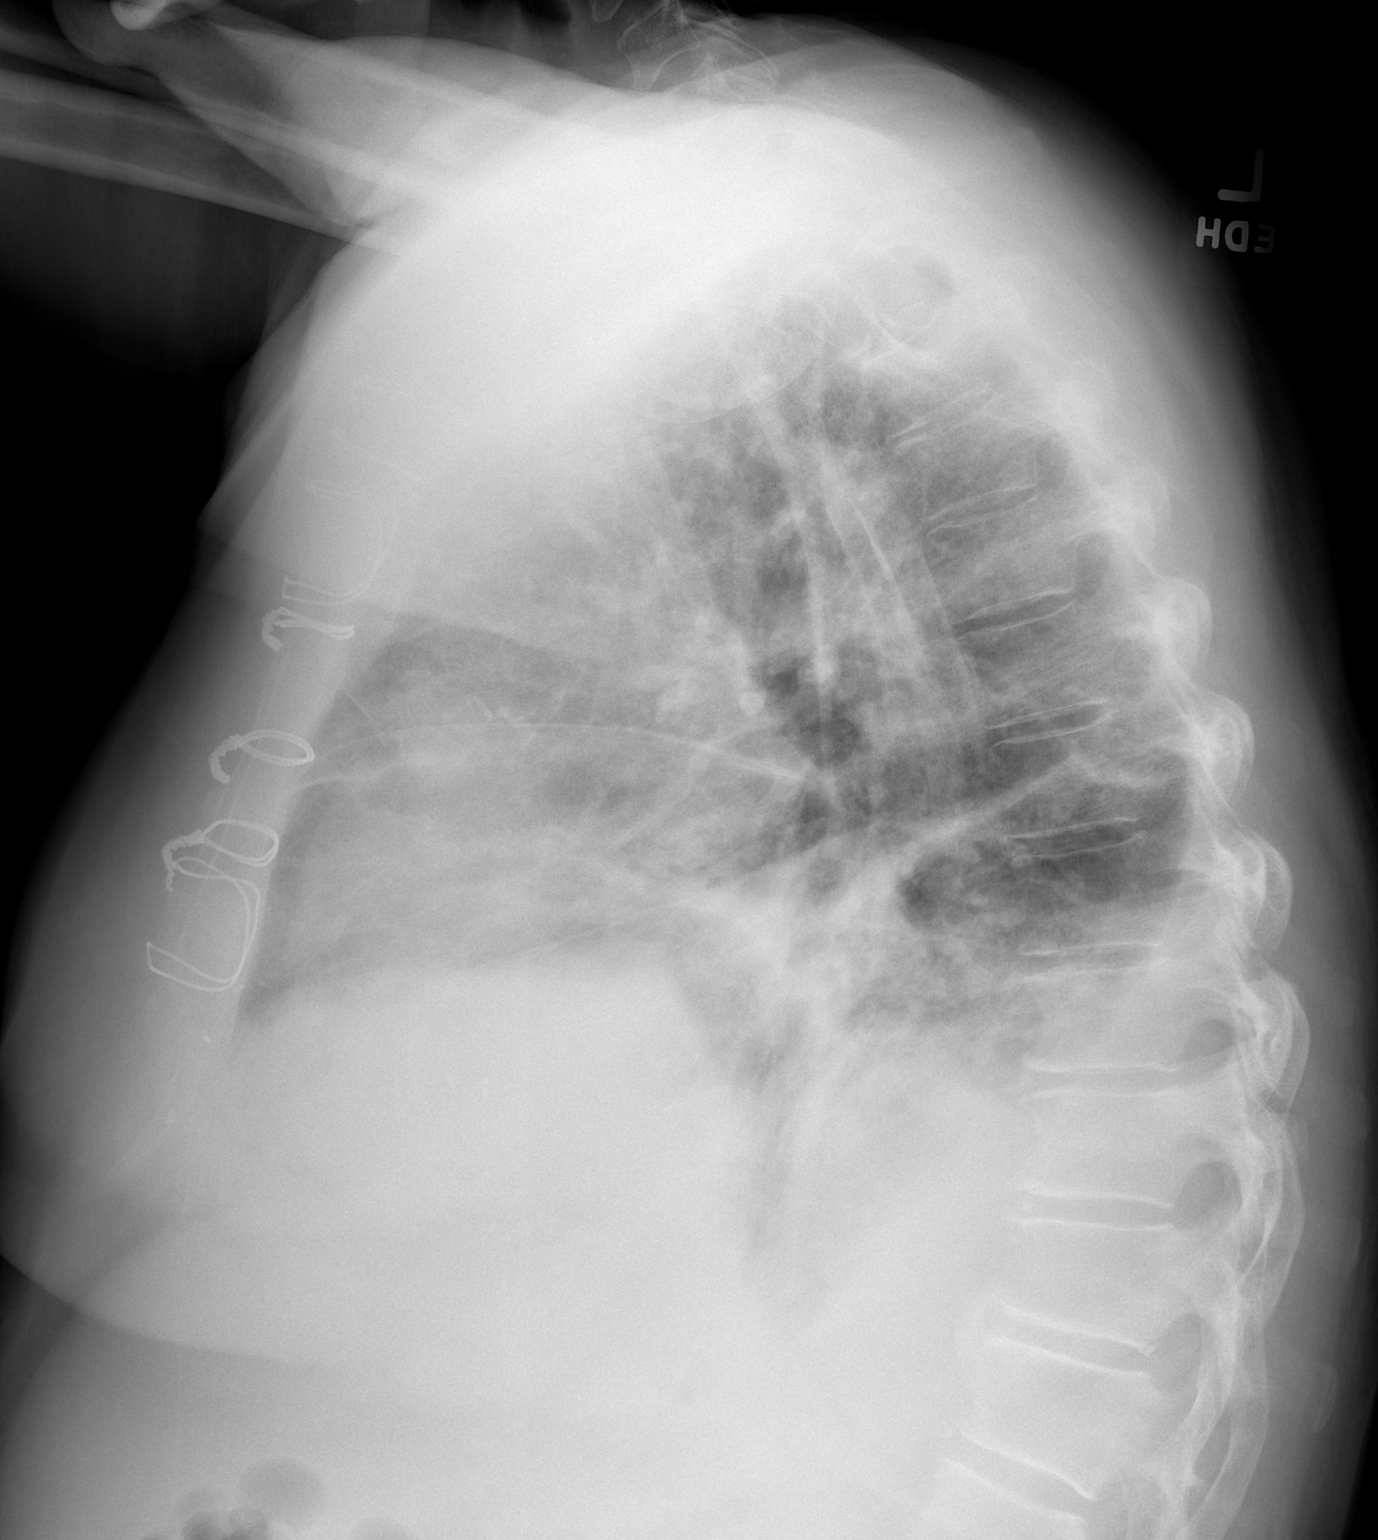

[2 of 2 positions shown; findings below may reference images not displayed]

FINDINGS: Status post median sternotomy and CABG procedure. Heart size is
enlarged. There are bilateral pleural effusions identified with
overlying bibasilar atelectasis. Pulmonary vascular congestion
noted.
IMPRESSION: 1. Cardiac enlargement, bilateral pleural effusions and pulmonary
vascular congestion compatible with CHF.

## 2021-02-02 ENCOUNTER — Other Ambulatory Visit: Payer: Self-pay

## 2021-02-02 ENCOUNTER — Ambulatory Visit (INDEPENDENT_AMBULATORY_CARE_PROVIDER_SITE_OTHER): Payer: Medicaid Other | Admitting: Physician Assistant

## 2021-02-02 ENCOUNTER — Ambulatory Visit (HOSPITAL_COMMUNITY)
Admission: RE | Admit: 2021-02-02 | Discharge: 2021-02-02 | Disposition: A | Discharge status: Home / Self Care | Payer: Medicaid Other | Source: Ambulatory Visit | Attending: Physician Assistant | Admitting: Physician Assistant

## 2021-02-02 ENCOUNTER — Telehealth: Payer: Self-pay

## 2021-02-02 ENCOUNTER — Other Ambulatory Visit (HOSPITAL_COMMUNITY): Payer: Self-pay | Admitting: Vascular Surgery

## 2021-02-02 VITALS — BP 111/69 | HR 61 | Temp 97.3°F | Resp 18 | Ht 67.0 in | Wt 218.0 lb

## 2021-02-02 DIAGNOSIS — I70213 Atherosclerosis of native arteries of extremities with intermittent claudication, bilateral legs: Secondary | ICD-10-CM

## 2021-02-02 DIAGNOSIS — I739 Peripheral vascular disease, unspecified: Secondary | ICD-10-CM

## 2021-02-02 DIAGNOSIS — I724 Aneurysm of artery of lower extremity: Secondary | ICD-10-CM

## 2021-02-02 DIAGNOSIS — T81718A Complication of other artery following a procedure, not elsewhere classified, initial encounter: Secondary | ICD-10-CM | POA: Diagnosis not present

## 2021-02-02 NOTE — H&P (View-Only) (Signed)
POST OPERATIVE OFFICE NOTE    CC:  F/u for surgery  HPI:  This is a 60 y.o. male who is s/p Abdominal aortogram, bilateral lower extremity runoff with stenting of left SFA and popliteal artery by Dr. Trula Slade on 01/17/21. This was done secondary to lifestyle limiting claudication. He reports his symptoms in the left leg are improved since intervention. However he noticed general swelling and bruising in right groin day of Angiogram and then he said over 1 week the swelling and bruising has subsided but now he has a more pronounced egg size knot that is very painful. He says he has had trouble walking and sleeping because it is so painful. He does take Eliquis and Plavix.   Allergies  Allergen Reactions   Codeine Shortness Of Breath and Itching   Hydrocodone Shortness Of Breath, Nausea Only and Rash    Breathing problems ALSO   Gabapentin Other (See Comments)    Suicidal thoughts, extreme dreams   Pregabalin Other (See Comments)    Suicidal thoughts, extreme dreams    Simvastatin Other (See Comments)    Pain in joints   Lisinopril Itching and Rash   Venlafaxine Rash    Current Outpatient Medications  Medication Sig Dispense Refill   amLODipine (NORVASC) 5 MG tablet Take 5 mg by mouth daily.     apixaban (ELIQUIS) 5 MG TABS tablet Take 5 mg by mouth 2 (two) times daily.     atorvastatin (LIPITOR) 40 MG tablet Take 40 mg by mouth daily.     carvedilol (COREG) 25 MG tablet Take 25 mg by mouth 2 (two) times daily with a meal.     clopidogrel (PLAVIX) 75 MG tablet Take 1 tablet (75 mg total) by mouth daily. 30 tablet 11   clopidogrel (PLAVIX) 75 MG tablet Take 1 tablet (75 mg total) by mouth daily. 30 tablet 11   clopidogrel (PLAVIX) 75 MG tablet Take 1 tablet (75 mg total) by mouth daily. 30 tablet 11   Dulaglutide 1.5 MG/0.5ML SOPN Inject 1.5 mg into the skin every Wednesday.     empagliflozin (JARDIANCE) 25 MG TABS tablet Take 1 tablet (25 mg total) by mouth daily before breakfast. 30  tablet    ezetimibe (ZETIA) 10 MG tablet Take 1 tablet (10 mg total) by mouth daily. 30 tablet 2   furosemide (LASIX) 40 MG tablet Take 40 mg by mouth daily.     metFORMIN (GLUCOPHAGE) 1000 MG tablet Take 1,000 mg by mouth 2 (two) times daily.     OXYGEN Inhale 3 L/min into the lungs at bedtime as needed (for shortness of breath).     sacubitril-valsartan (ENTRESTO) 97-103 MG Take 1 tablet by mouth 2 (two) times daily. 180 tablet 3   spironolactone (ALDACTONE) 25 MG tablet Take 25 mg by mouth daily.     TRESIBA FLEXTOUCH 100 UNIT/ML FlexTouch Pen Inject 30 Units into the skin daily.     No current facility-administered medications for this visit.     ROS:  See HPI  Physical Exam:  Vitals:   02/02/21 1315  BP: 111/69  Pulse: 61  Resp: 18  Temp: (!) 97.3 F (36.3 C)  TempSrc: Temporal  SpO2: 98%  Weight: 218 lb (98.9 kg)  Height: 5\' 7"  (1.702 m)   General: Well developed, well nourished, in discomfort but  no acute distress Cardiac: regular Lungs:non labored Extremities:  well perfused and warm. 2+ femoral pulses bilaterally. There is palpable pulsatile mass in right groin. Right groin with a  lot of ecchymosis extending to RLQ, mons pubis, and down medial and anterior right thigh to knee. 2+ PT bilaterally. No palpable DP Neuro: alert and oriented Abdomen:  obese, non distended  VAS [place] Lower Extremity Arterial Pseudoaneurysm: 02/02/21 Findings: An area with well defined borders measuring 1.6 cm x 2.2 cm was visualized  arising off of the right common femoral artery with ultrasound characteristics of a pseudoaneurysm. Neck length of 1.25 cm. Depth of approximately 2 cm.     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   Assessment/Plan:  This is a 60 y.o. male who is s/p Abdominal aortogram, bilateral lower extremity runoff with stenting of left SFA and popliteal artery by Dr. Trula Slade on 01/17/21. He presents with painful knot in right groin at  femoral access site. Duplex today shows pseudoaneurysm of 1.6 x 2.2 cm with 1.25 cm neck with flow. Patient is NPO but he has presented to visit alone. Discussed with on call vascular surgeon, Dr. Virl Cagey, and will arrange for patient to have thrombin injection tomorrow 02/03/21 at the hospital. He will hold his Eliquis and Plavix. He will remain NPO after midnight   Karoline Caldwell, PA-C [facility] [phone]  On call MD:  Dr. Virl Cagey

## 2021-02-02 NOTE — Telephone Encounter (Signed)
Patient calls today to report large egg-size knot on right groin s/p aortogram. It is painful. Denies any foot or leg pain, just pain in the groin. Placed on schedule for evaluation and right pseudoaneurysm duplex.

## 2021-02-02 NOTE — Progress Notes (Signed)
POST OPERATIVE OFFICE NOTE    CC:  F/u for surgery  HPI:  This is a 60 y.o. male who is s/p Abdominal aortogram, bilateral lower extremity runoff with stenting of left SFA and popliteal artery by Dr. Trula Slade on 01/17/21. This was done secondary to lifestyle limiting claudication. He reports his symptoms in the left leg are improved since intervention. However he noticed general swelling and bruising in right groin day of Angiogram and then he said over 1 week the swelling and bruising has subsided but now he has a more pronounced egg size knot that is very painful. He says he has had trouble walking and sleeping because it is so painful. He does take Eliquis and Plavix.   Allergies  Allergen Reactions   Codeine Shortness Of Breath and Itching   Hydrocodone Shortness Of Breath, Nausea Only and Rash    Breathing problems ALSO   Gabapentin Other (See Comments)    Suicidal thoughts, extreme dreams   Pregabalin Other (See Comments)    Suicidal thoughts, extreme dreams    Simvastatin Other (See Comments)    Pain in joints   Lisinopril Itching and Rash   Venlafaxine Rash    Current Outpatient Medications  Medication Sig Dispense Refill   amLODipine (NORVASC) 5 MG tablet Take 5 mg by mouth daily.     apixaban (ELIQUIS) 5 MG TABS tablet Take 5 mg by mouth 2 (two) times daily.     atorvastatin (LIPITOR) 40 MG tablet Take 40 mg by mouth daily.     carvedilol (COREG) 25 MG tablet Take 25 mg by mouth 2 (two) times daily with a meal.     clopidogrel (PLAVIX) 75 MG tablet Take 1 tablet (75 mg total) by mouth daily. 30 tablet 11   clopidogrel (PLAVIX) 75 MG tablet Take 1 tablet (75 mg total) by mouth daily. 30 tablet 11   clopidogrel (PLAVIX) 75 MG tablet Take 1 tablet (75 mg total) by mouth daily. 30 tablet 11   Dulaglutide 1.5 MG/0.5ML SOPN Inject 1.5 mg into the skin every Wednesday.     empagliflozin (JARDIANCE) 25 MG TABS tablet Take 1 tablet (25 mg total) by mouth daily before breakfast. 30  tablet    ezetimibe (ZETIA) 10 MG tablet Take 1 tablet (10 mg total) by mouth daily. 30 tablet 2   furosemide (LASIX) 40 MG tablet Take 40 mg by mouth daily.     metFORMIN (GLUCOPHAGE) 1000 MG tablet Take 1,000 mg by mouth 2 (two) times daily.     OXYGEN Inhale 3 L/min into the lungs at bedtime as needed (for shortness of breath).     sacubitril-valsartan (ENTRESTO) 97-103 MG Take 1 tablet by mouth 2 (two) times daily. 180 tablet 3   spironolactone (ALDACTONE) 25 MG tablet Take 25 mg by mouth daily.     TRESIBA FLEXTOUCH 100 UNIT/ML FlexTouch Pen Inject 30 Units into the skin daily.     No current facility-administered medications for this visit.     ROS:  See HPI  Physical Exam:  Vitals:   02/02/21 1315  BP: 111/69  Pulse: 61  Resp: 18  Temp: (!) 97.3 F (36.3 C)  TempSrc: Temporal  SpO2: 98%  Weight: 218 lb (98.9 kg)  Height: 5\' 7"  (1.702 m)   General: Well developed, well nourished, in discomfort but  no acute distress Cardiac: regular Lungs:non labored Extremities:  well perfused and warm. 2+ femoral pulses bilaterally. There is palpable pulsatile mass in right groin. Right groin with a  lot of ecchymosis extending to RLQ, mons pubis, and down medial and anterior right thigh to knee. 2+ PT bilaterally. No palpable DP Neuro: alert and oriented Abdomen:  obese, non distended  VAS [place] Lower Extremity Arterial Pseudoaneurysm: 02/02/21 Findings: An area with well defined borders measuring 1.6 cm x 2.2 cm was visualized  arising off of the right common femoral artery with ultrasound characteristics of a pseudoaneurysm. Neck length of 1.25 cm. Depth of approximately 2 cm.     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   Assessment/Plan:  This is a 60 y.o. male who is s/p Abdominal aortogram, bilateral lower extremity runoff with stenting of left SFA and popliteal artery by Dr. Trula Slade on 01/17/21. He presents with painful knot in right groin at  femoral access site. Duplex today shows pseudoaneurysm of 1.6 x 2.2 cm with 1.25 cm neck with flow. Patient is NPO but he has presented to visit alone. Discussed with on call vascular surgeon, Dr. Virl Cagey, and will arrange for patient to have thrombin injection tomorrow 02/03/21 at the hospital. He will hold his Eliquis and Plavix. He will remain NPO after midnight   Karoline Caldwell, PA-C [facility] [phone]  On call MD:  Dr. Virl Cagey

## 2021-02-03 ENCOUNTER — Encounter (HOSPITAL_COMMUNITY)
Admission: RE | Disposition: A | Discharge status: Home / Self Care | Payer: Self-pay | Source: Home / Self Care | Attending: Vascular Surgery

## 2021-02-03 ENCOUNTER — Other Ambulatory Visit: Payer: Self-pay

## 2021-02-03 ENCOUNTER — Ambulatory Visit (HOSPITAL_COMMUNITY)
Admission: RE | Admit: 2021-02-03 | Discharge: 2021-02-03 | Disposition: A | Discharge status: Home / Self Care | Payer: Medicaid Other | Source: Ambulatory Visit

## 2021-02-03 ENCOUNTER — Ambulatory Visit (HOSPITAL_COMMUNITY)
Admission: RE | Admit: 2021-02-03 | Discharge: 2021-02-03 | Disposition: A | Discharge status: Home / Self Care | Payer: Medicaid Other | Attending: Vascular Surgery | Admitting: Vascular Surgery

## 2021-02-03 ENCOUNTER — Other Ambulatory Visit (HOSPITAL_COMMUNITY): Payer: Self-pay | Admitting: Vascular Surgery

## 2021-02-03 DIAGNOSIS — I9789 Other postprocedural complications and disorders of the circulatory system, not elsewhere classified: Secondary | ICD-10-CM | POA: Diagnosis not present

## 2021-02-03 DIAGNOSIS — Z885 Allergy status to narcotic agent status: Secondary | ICD-10-CM | POA: Insufficient documentation

## 2021-02-03 DIAGNOSIS — I724 Aneurysm of artery of lower extremity: Secondary | ICD-10-CM

## 2021-02-03 DIAGNOSIS — Z79899 Other long term (current) drug therapy: Secondary | ICD-10-CM | POA: Insufficient documentation

## 2021-02-03 DIAGNOSIS — Z7902 Long term (current) use of antithrombotics/antiplatelets: Secondary | ICD-10-CM | POA: Diagnosis not present

## 2021-02-03 DIAGNOSIS — Z888 Allergy status to other drugs, medicaments and biological substances status: Secondary | ICD-10-CM | POA: Insufficient documentation

## 2021-02-03 DIAGNOSIS — Z7984 Long term (current) use of oral hypoglycemic drugs: Secondary | ICD-10-CM | POA: Diagnosis not present

## 2021-02-03 DIAGNOSIS — Z7985 Long-term (current) use of injectable non-insulin antidiabetic drugs: Secondary | ICD-10-CM | POA: Diagnosis not present

## 2021-02-03 DIAGNOSIS — Z794 Long term (current) use of insulin: Secondary | ICD-10-CM | POA: Insufficient documentation

## 2021-02-03 DIAGNOSIS — Z7901 Long term (current) use of anticoagulants: Secondary | ICD-10-CM | POA: Diagnosis not present

## 2021-02-03 LAB — GLUCOSE, CAPILLARY: Glucose-Capillary: 152 mg/dL — ABNORMAL HIGH (ref 70–99)

## 2021-02-03 SURGERY — PERIPHERAL VASCULAR THROMBECTOMY
Anesthesia: LOCAL | Laterality: Right

## 2021-02-03 MED ORDER — THROMBIN FOR PERCUTANEOUS TREATMENT OF PSEUDOANEURYSM (5000UNITS/10ML)
5000.0000 [IU] | Freq: Once | PERCUTANEOUS | Status: DC
  Filled 2021-02-03: qty 1

## 2021-02-03 MED ORDER — SODIUM CHLORIDE 0.9 % IV SOLN: INTRAVENOUS | Status: DC

## 2021-02-03 MED ORDER — LIDOCAINE HCL (PF) 1 % IJ SOLN
INTRAMUSCULAR | Status: AC
  Filled 2021-02-03: qty 30

## 2021-02-03 MED ORDER — HYDROMORPHONE HCL 1 MG/ML IJ SOLN
INTRAMUSCULAR | Status: AC
  Administered 2021-02-03: 0.5 mg via INTRAVENOUS
  Filled 2021-02-03: qty 1

## 2021-02-03 MED ORDER — SODIUM CHLORIDE 0.9% FLUSH: 3.0000 mL | INTRAVENOUS | Status: DC | PRN

## 2021-02-03 MED ORDER — SODIUM CHLORIDE 0.9% FLUSH: 3.0000 mL | Freq: Two times a day (BID) | INTRAVENOUS | Status: DC

## 2021-02-03 MED ORDER — HYDROMORPHONE HCL 1 MG/ML IJ SOLN
0.5000 mg | INTRAMUSCULAR | Status: AC
Start: 2021-02-03 — End: 2021-02-03

## 2021-02-03 MED ORDER — SODIUM CHLORIDE 0.9 % IV SOLN: 250.0000 mL | INTRAVENOUS | Status: DC | PRN

## 2021-02-03 NOTE — Op Note (Signed)
DATE OF SERVICE: 02/03/2021  PATIENT:  [name]  60 y.o. male  PRE-OPERATIVE DIAGNOSIS:  right common femoral artery pseudoaneurysm  POST-OPERATIVE DIAGNOSIS:  Same  PROCEDURE:   ultrasound guided thrombin injection of right common femoral artery pseudoaneurysm  SURGEON:  Surgeon(s) and Role:    * Cherre Robins, MD - Primary  ASSISTANT: none  ANESTHESIA:   local  EBL: minimal  BLOOD ADMINISTERED:none  DRAINS: none   LOCAL MEDICATIONS USED:  LIDOCAINE   SPECIMEN:  none  COUNTS: confirmed correct.  TOURNIQUET:  none  PATIENT DISPOSITION:  PACU - hemodynamically stable.   Delay start of Pharmacological VTE agent (>24hrs) due to surgical blood loss or risk of bleeding: no  INDICATION FOR PROCEDURE: [name] is a 60 y.o. male with right common femoral artery pseudoaneurysm after lower extremity angiography. After careful discussion of risks, benefits, and alternatives the patient was offered ultrasound directed thrombin injection. We specifically discussed risk of thromboembolism and failure of treatment. The patient understood and wished to proceed.  OPERATIVE FINDINGS: successful thrombin injection to right femoral artery pseudoaneurysm  DESCRIPTION OF PROCEDURE: After identification of the patient in the pre-operative holding area, the patient was transferred to the operating room. The patient was positioned supine on the operating room table. The right groin was prepped and draped in standard fashion. A surgical pause was performed confirming correct patient, procedure, and operative location.  66mL of 1% lidocaine was injected about the skin overlying the pseudoaneurysm. A micropuncture needle was introduced into the pseudocapsule under ultrasound guidance. Thrombin was reconstituted and <0.33mL was injected into the pseudocapsule. Color flow doppler was used to evaluate the pseduoaneurysm. Cessation of flow was noted in the pseudoaneurysm. Doppler flow  was noted at the right posterior tibial artery. A formal ultrasound was performed to confirm thrombosis of the pseudoaneurysm.   Upon completion of the case instrument and sharps counts were confirmed correct. The patient was transferred to the PACU in good condition. I was present for all portions of the procedure.  Yevonne Aline. Stanford Breed, MD [facility] Phone Number: [phone] 02/03/2021 10:53 AM

## 2021-02-03 NOTE — Interval H&P Note (Signed)
History and Physical Interval Note:  02/03/2021 10:53 AM  [name]  has presented today for surgery, with the diagnosis of right femoral artery pseudo aneurysm.  The various methods of treatment have been discussed with the patient and family. After consideration of risks, benefits and other options for treatment, the patient has consented to  Procedure(s): FEMORAL ARTERY PSEUDOANEURYSM INJECTION (Right) as a surgical intervention.  The patient's history has been reviewed, patient examined, no change in status, stable for surgery.  I have reviewed the patient's chart and labs.  Questions were answered to the patient's satisfaction.     Cherre Robins

## 2021-02-03 NOTE — Progress Notes (Signed)
Remote ICD transmission.   

## 2021-02-03 NOTE — Progress Notes (Signed)
Lower extremity pseudo injection has been completed.   Preliminary results in CV Proc.   Nadira Single Azka Steger 02/03/2021 11:08 AM

## 2021-02-07 ENCOUNTER — Ambulatory Visit (HOSPITAL_COMMUNITY)
Admission: RE | Admit: 2021-02-07 | Discharge: 2021-02-07 | Disposition: A | Discharge status: Home / Self Care | Payer: Medicaid Other | Attending: Surgery | Admitting: Surgery

## 2021-02-07 ENCOUNTER — Other Ambulatory Visit: Payer: Self-pay

## 2021-02-07 ENCOUNTER — Encounter (HOSPITAL_COMMUNITY)
Admission: RE | Disposition: A | Discharge status: Home / Self Care | Payer: Self-pay | Source: Home / Self Care | Attending: Surgery

## 2021-02-07 ENCOUNTER — Encounter: Payer: Medicaid Other | Admitting: Internal Medicine

## 2021-02-07 DIAGNOSIS — T82856A Stenosis of peripheral vascular stent, initial encounter: Secondary | ICD-10-CM | POA: Diagnosis not present

## 2021-02-07 DIAGNOSIS — I70211 Atherosclerosis of native arteries of extremities with intermittent claudication, right leg: Secondary | ICD-10-CM

## 2021-02-07 DIAGNOSIS — I724 Aneurysm of artery of lower extremity: Secondary | ICD-10-CM

## 2021-02-07 HISTORY — PX: PERIPHERAL VASCULAR BALLOON ANGIOPLASTY: CATH118281

## 2021-02-07 HISTORY — PX: ABDOMINAL AORTOGRAM W/LOWER EXTREMITY: CATH118223

## 2021-02-07 LAB — POCT ACTIVATED CLOTTING TIME
Activated Clotting Time: 260 seconds
Activated Clotting Time: 173 seconds

## 2021-02-07 LAB — POCT I-STAT, CHEM 8
BUN: 22 mg/dL — ABNORMAL HIGH (ref 6–20)
Calcium, Ion: 1.13 mmol/L — ABNORMAL LOW (ref 1.15–1.40)
Chloride: 99 mmol/L (ref 98–111)
Creatinine, Ser: 1 mg/dL (ref 0.61–1.24)
Glucose, Bld: 365 mg/dL — ABNORMAL HIGH (ref 70–99)
HCT: 47 % (ref 39.0–52.0)
Hemoglobin: 16 g/dL (ref 13.0–17.0)
Potassium: 4.1 mmol/L (ref 3.5–5.1)
Sodium: 137 mmol/L (ref 135–145)
TCO2: 25 mmol/L (ref 22–32)

## 2021-02-07 LAB — GLUCOSE, CAPILLARY: Glucose-Capillary: 190 mg/dL — ABNORMAL HIGH (ref 70–99)

## 2021-02-07 SURGERY — ABDOMINAL AORTOGRAM W/LOWER EXTREMITY
Anesthesia: LOCAL | Laterality: Right

## 2021-02-07 MED ORDER — OXYCODONE-ACETAMINOPHEN 5-325 MG PO TABS: 1.0000 | ORAL_TABLET | ORAL | 0 refills | Status: DC | PRN

## 2021-02-07 MED ORDER — MORPHINE SULFATE (PF) 2 MG/ML IV SOLN
2.0000 mg | INTRAVENOUS | Status: DC | PRN
  Administered 2021-02-07: 2 mg via INTRAVENOUS

## 2021-02-07 MED ORDER — MIDAZOLAM HCL 2 MG/2ML IJ SOLN
INTRAMUSCULAR | Status: DC | PRN
  Administered 2021-02-07: 2 mg via INTRAVENOUS
  Administered 2021-02-07: 1 mg via INTRAVENOUS

## 2021-02-07 MED ORDER — LIDOCAINE HCL (PF) 1 % IJ SOLN
INTRAMUSCULAR | Status: AC
  Filled 2021-02-07: qty 30

## 2021-02-07 MED ORDER — MORPHINE SULFATE (PF) 2 MG/ML IV SOLN
INTRAVENOUS | Status: AC
  Filled 2021-02-07: qty 1

## 2021-02-07 MED ORDER — HEPARIN SODIUM (PORCINE) 1000 UNIT/ML IJ SOLN
INTRAMUSCULAR | Status: AC
  Filled 2021-02-07: qty 1

## 2021-02-07 MED ORDER — FENTANYL CITRATE (PF) 100 MCG/2ML IJ SOLN
INTRAMUSCULAR | Status: DC | PRN
  Administered 2021-02-07: 25 ug via INTRAVENOUS
  Administered 2021-02-07: 50 ug via INTRAVENOUS
  Administered 2021-02-07: 25 ug via INTRAVENOUS

## 2021-02-07 MED ORDER — IODIXANOL 320 MG/ML IV SOLN
INTRAVENOUS | Status: DC | PRN
  Administered 2021-02-07: 55 mL via INTRA_ARTERIAL

## 2021-02-07 MED ORDER — ASPIRIN EC 81 MG PO TBEC
81.0000 mg | DELAYED_RELEASE_TABLET | Freq: Every day | ORAL | Status: DC
  Administered 2021-02-07: 81 mg via ORAL

## 2021-02-07 MED ORDER — SODIUM CHLORIDE 0.9% FLUSH: 3.0000 mL | INTRAVENOUS | Status: DC | PRN

## 2021-02-07 MED ORDER — FENTANYL CITRATE (PF) 100 MCG/2ML IJ SOLN
INTRAMUSCULAR | Status: AC
  Filled 2021-02-07: qty 2

## 2021-02-07 MED ORDER — MIDAZOLAM HCL 2 MG/2ML IJ SOLN
INTRAMUSCULAR | Status: AC
  Filled 2021-02-07: qty 2

## 2021-02-07 MED ORDER — CLOPIDOGREL BISULFATE 75 MG PO TABS
75.0000 mg | ORAL_TABLET | Freq: Every day | ORAL | Status: DC
  Administered 2021-02-07: 75 mg via ORAL

## 2021-02-07 MED ORDER — LIDOCAINE HCL (PF) 1 % IJ SOLN
INTRAMUSCULAR | Status: DC | PRN
  Administered 2021-02-07: 17 mL via INTRADERMAL

## 2021-02-07 MED ORDER — SODIUM CHLORIDE 0.9 % WEIGHT BASED INFUSION
1.0000 mL/kg/h | INTRAVENOUS | Status: DC
  Administered 2021-02-07: 1 mL/kg/h via INTRAVENOUS

## 2021-02-07 MED ORDER — HEPARIN (PORCINE) IN NACL 1000-0.9 UT/500ML-% IV SOLN
INTRAVENOUS | Status: DC | PRN
  Administered 2021-02-07 (×2): 500 mL

## 2021-02-07 MED ORDER — SODIUM CHLORIDE 0.9 % IV SOLN: INTRAVENOUS | Status: DC

## 2021-02-07 MED ORDER — SODIUM CHLORIDE 0.9 % IV SOLN: 250.0000 mL | INTRAVENOUS | Status: DC | PRN

## 2021-02-07 MED ORDER — LABETALOL HCL 5 MG/ML IV SOLN
10.0000 mg | INTRAVENOUS | Status: DC | PRN
Start: 2021-02-07 — End: 2021-02-07

## 2021-02-07 MED ORDER — ACETAMINOPHEN 325 MG PO TABS
ORAL_TABLET | ORAL | Status: AC
  Filled 2021-02-07: qty 2

## 2021-02-07 MED ORDER — HEPARIN SODIUM (PORCINE) 1000 UNIT/ML IJ SOLN
INTRAMUSCULAR | Status: DC | PRN
  Administered 2021-02-07: 10000 [IU] via INTRAVENOUS

## 2021-02-07 MED ORDER — ACETAMINOPHEN 325 MG PO TABS
650.0000 mg | ORAL_TABLET | ORAL | Status: DC | PRN
  Administered 2021-02-07: 650 mg via ORAL
  Filled 2021-02-07: qty 2

## 2021-02-07 MED ORDER — ASPIRIN 81 MG PO CHEW
CHEWABLE_TABLET | ORAL | Status: AC
  Filled 2021-02-07: qty 1

## 2021-02-07 MED ORDER — HEPARIN (PORCINE) IN NACL 1000-0.9 UT/500ML-% IV SOLN
INTRAVENOUS | Status: AC
  Filled 2021-02-07: qty 1000

## 2021-02-07 MED ORDER — SODIUM CHLORIDE 0.9% FLUSH: 3.0000 mL | Freq: Two times a day (BID) | INTRAVENOUS | Status: DC

## 2021-02-07 MED ORDER — CLOPIDOGREL BISULFATE 75 MG PO TABS
ORAL_TABLET | ORAL | Status: AC
  Filled 2021-02-07: qty 1

## 2021-02-07 MED ORDER — HYDRALAZINE HCL 20 MG/ML IJ SOLN: 5.0000 mg | INTRAMUSCULAR | Status: DC | PRN

## 2021-02-07 SURGICAL SUPPLY — 18 items
BAG SNAP BAND KOVER 36X36 (MISCELLANEOUS) ×1 IMPLANT
BALLN STERLING OTW 6X40X135 (BALLOONS) ×3
BALLOON STERLING OTW 6X40X135 (BALLOONS) IMPLANT
CATH ANGIO 5F BER2 65CM (CATHETERS) ×1 IMPLANT
CATH ANGIO 5F PIGTAIL 65CM (CATHETERS) ×1 IMPLANT
DCB RANGER 5.0X100 135 (BALLOONS) IMPLANT
DEVICE CONTINUOUS FLUSH (MISCELLANEOUS) ×1 IMPLANT
KIT ENCORE 26 ADVANTAGE (KITS) ×1 IMPLANT
KIT MICROPUNCTURE NIT STIFF (SHEATH) ×1 IMPLANT
KIT PV (KITS) ×3 IMPLANT
RANGER DCB 5.0X100 135 (BALLOONS) ×3
SHEATH DESTINATION MP 5FR 45CM (SHEATH) ×1 IMPLANT
SHEATH PINNACLE 5F 10CM (SHEATH) ×1 IMPLANT
SHEATH PROBE COVER 6X72 (BAG) ×1 IMPLANT
TRANSDUCER W/STOPCOCK (MISCELLANEOUS) ×3 IMPLANT
TRAY PV CATH (CUSTOM PROCEDURE TRAY) ×3 IMPLANT
WIRE BENTSON .035X145CM (WIRE) ×1 IMPLANT
WIRE G V18X300CM (WIRE) ×1 IMPLANT

## 2021-02-07 NOTE — Progress Notes (Signed)
Pt ambulated without difficulty or bleeding.   Discharged home with Velvet, his ex-wife, who will drive and stay with pt x 24 hrs.

## 2021-02-07 NOTE — Discharge Instructions (Addendum)
Start Eliquis Thursday

## 2021-02-07 NOTE — Op Note (Signed)
    Patient name: [name] MRN: [id number] DOB: 1961-02-13 Sex: male  02/07/2021 Pre-operative Diagnosis: Lateral claudication Post-operative diagnosis:  Same Surgeon:  [name] Procedure Performed:  1.  Ultrasound-guided access, left femoral artery  2.  Right lower extremity runoff  3.  Drug-coated balloon angioplasty, right superficial femoral and popliteal artery  4.  Conscious sedation, 55 minutes  Indications: This is a 60 year old gentleman who recently underwent left leg intervention complicated by a pseudoaneurysm which was injected and resolved.  He comes in today for treatment of in-stent stenosis on the right.  Procedure:  The patient was identified in the holding area and taken to room 8.  The patient was then placed supine on the table and prepped and draped in the usual sterile fashion.  A time out was called.  Conscious sedation was administered with the use of IV fentanyl and Versed under continuous physician and nurse monitoring.  Heart rate, blood pressure, and oxygen saturation were continuously monitored.  Total sedation time was 55 minutes.  Ultrasound was used to evaluate the left common femoral artery.  It was patent .  A digital ultrasound image was acquired.  A micropuncture needle was used to access the left common femoral artery under ultrasound guidance.  An 018 wire was advanced without resistance and a micropuncture sheath was placed.  The 018 wire was removed and a benson wire was placed.  The micropuncture sheath was exchanged for a 5 french sheath.  I cut a pigtail catheter and used this to cross the aortic bifurcation.  The wire was placed in the right external iliac artery.  Next a 5 French 45 cm sheath was advanced into the right external iliac artery and right leg runoff was performed. Findings:     Right Lower Extremity: No evidence of pseudoaneurysm in the right groin.  The common femoral and profundofemoral artery are widely patent.  The  superficial femoral artery is widely patent.  There are stents in the adductor canal and the above-knee popliteal artery with approximately 60% in-stent stenosis.  There is two-vessel runoff via the posterior tibial peroneal artery.     Intervention: After the above images were acquired the decision made to proceed with intervention.  Patient is fully heparinized.  A V-18 wire was advanced across the lesion and into the popliteal artery.  I first treated this using a 5 x 100 Ranger drug-coated balloon.  This was taken to 17 atm for 3 minutes.  Completion imaging revealed residual stenosis at the proximal stent and at the stent overlap.  Therefore a 6 x 40 Sterling balloon was used to treat these areas.  Follow-up imaging revealed resolution of the stenosis.  Patient taken the holding area for sheath pull once his coagulation profile corrects.  Impression:  #1  Approximate 60% in-stent stenosis on the right treated with a 5 mm drug-coated balloon followed by 6 mm Sterling balloon with no residual stenosis  #2  Two-vessel runoff via posterior tibial peroneal artery  #3  No residual right femoral pseudoaneurysm  #4  The patient will continue triple therapy for another month and then can come off his Plavix.    Theotis Burrow, M.D., [facility] [place] Office: [phone] Pager:  [phone]

## 2021-02-07 NOTE — H&P (Signed)
   Patient name: [name] MRN: [id number] DOB: 08-Jan-1961 Sex: male    HISTORY OF PRESENT ILLNESS:   [name] is a 60 y.o. male who recently unmderwent left leg intervention complicated by a right groin pseudo that was ionjected.  He is here for right leg intervention  CURRENT MEDICATIONS:    Current Facility-Administered Medications  Medication Dose Route Frequency Provider Last Rate Last Admin   0.9 %  sodium chloride infusion   Intravenous Continuous Serafina Mitchell, MD 100 mL/hr at 02/07/21 0545 New Bag at 02/07/21 0545    REVIEW OF SYSTEMS:   [X]  denotes positive finding, [ ]  denotes negative finding Cardiac  Comments:  Chest pain or chest pressure:    Shortness of breath upon exertion:    Short of breath when lying flat:    Irregular heart rhythm:    Constitutional    Fever or chills:      PHYSICAL EXAM:   Vitals:   02/07/21 0549  BP: (!) 146/85  Pulse: 66  Resp: 16  Temp: 97.7 F (36.5 C)  TempSrc: Oral  SpO2: 98%  Weight: 97.5 kg  Height: 5\' 7"  (1.702 m)    GENERAL: The patient is a well-nourished male, in no acute distress. The vital signs are documented above. CARDIOVASCULAR: There is a regular rate and rhythm. PULMONARY: Non-labored respirations   STUDIES:     MEDICAL ISSUES:   Plan for right leg angio and intervention.  All questions answered  Leia Alf, MD, FACS [facility] [phone] Pager [phone]

## 2021-02-07 NOTE — Progress Notes (Signed)
Site area: left groin fa sheath Site Prior to Removal:  Level 0 Pressure Applied For: 20 minutes Manual:   yes Patient Status During Pull:  stable Post Pull Site:  Level 0 Post Pull Instructions Given:  yes Post Pull Pulses Present: left pt dopplered Dressing Applied:  gauze and tegaderm Bedrest begins @ 7076 Comments:

## 2021-02-08 ENCOUNTER — Encounter (HOSPITAL_COMMUNITY): Payer: Self-pay | Admitting: Surgery

## 2021-02-25 ENCOUNTER — Other Ambulatory Visit: Payer: Self-pay

## 2021-02-25 DIAGNOSIS — I739 Peripheral vascular disease, unspecified: Secondary | ICD-10-CM

## 2021-02-27 ENCOUNTER — Other Ambulatory Visit: Payer: Self-pay

## 2021-02-27 MED ORDER — EZETIMIBE 10 MG PO TABS: 10.0000 mg | ORAL_TABLET | Freq: Every day | ORAL | Status: DC

## 2021-03-02 ENCOUNTER — Encounter: Payer: Self-pay | Admitting: Internal Medicine

## 2021-03-02 ENCOUNTER — Other Ambulatory Visit: Payer: Self-pay

## 2021-03-02 ENCOUNTER — Ambulatory Visit (INDEPENDENT_AMBULATORY_CARE_PROVIDER_SITE_OTHER): Payer: Medicaid Other | Admitting: Internal Medicine

## 2021-03-02 VITALS — BP 142/70 | HR 72 | Ht 67.0 in | Wt 221.6 lb

## 2021-03-02 DIAGNOSIS — I255 Ischemic cardiomyopathy: Secondary | ICD-10-CM | POA: Diagnosis not present

## 2021-03-02 DIAGNOSIS — Z95810 Presence of automatic (implantable) cardiac defibrillator: Secondary | ICD-10-CM | POA: Diagnosis not present

## 2021-03-02 NOTE — Patient Instructions (Addendum)
Medication Instructions:  Your physician recommends that you continue on your current medications as directed. Please refer to the Current Medication list given to you today.  Labwork: None ordered.  Testing/Procedures: None ordered.  Follow-Up: Your physician wants you to follow-up in: one year one of the following Advanced Practice Providers on your designated Care Team:    Lee "Jonni Sanger" [place], [place]  Remote monitoring is used to monitor your ICD from home. This monitoring reduces the number of office visits required to check your device to one time per year. It allows [place] to keep an eye on the functioning of your device to ensure it is working properly. You are scheduled for a device check from home on 04/27/2021. You may send your transmission at any time that day. If you have a wireless device, the transmission will be sent automatically. After your physician reviews your transmission, you will receive a postcard with your next transmission date.  Any Other Special Instructions Will Be Listed Below (If Applicable).  If you need a refill on your cardiac medications before your next appointment, please call your pharmacy.

## 2021-03-02 NOTE — Progress Notes (Signed)
HPI [name] returns today for followup. He is a pleasant 60 yo man with a h/o ICM, s/p CABG who has severe LV dysfunction and is s/p ICD insertion. In the interim he has had carotid endarterectomy. He has also developed severe peripheral vascular disease and is s/p stenting of the left superficial fem and popliteal arteries complicated by pseudoaneurysm.  Allergies  Allergen Reactions   Gabapentin Other (See Comments)    Suicidal thoughts, extreme dreams   Pregabalin Other (See Comments)    Suicidal thoughts, extreme dreams    Simvastatin Other (See Comments)    Pain in joints   Lisinopril Itching and Rash   Venlafaxine Rash     Current Outpatient Medications  Medication Sig Dispense Refill   amLODipine (NORVASC) 5 MG tablet Take 5 mg by mouth daily.     apixaban (ELIQUIS) 5 MG TABS tablet Take 5 mg by mouth 2 (two) times daily.     atorvastatin (LIPITOR) 40 MG tablet Take 40 mg by mouth daily.     carvedilol (COREG) 25 MG tablet Take 25 mg by mouth 2 (two) times daily with a meal.     clopidogrel (PLAVIX) 75 MG tablet Take 1 tablet (75 mg total) by mouth daily. 30 tablet 11   Dulaglutide 1.5 MG/0.5ML SOPN Inject 1.5 mg into the skin once a week.     empagliflozin (JARDIANCE) 25 MG TABS tablet Take 1 tablet (25 mg total) by mouth daily before breakfast. 30 tablet    ezetimibe (ZETIA) 10 MG tablet Take 1 tablet (10 mg total) by mouth daily. 30 tablet 2   furosemide (LASIX) 40 MG tablet Take 40 mg by mouth daily.     metFORMIN (GLUCOPHAGE) 1000 MG tablet Take 1,000 mg by mouth 2 (two) times daily.     oxyCODONE-acetaminophen (PERCOCET) 5-325 MG tablet Take 1 tablet by mouth every 4 (four) hours as needed for severe pain. 20 tablet 0   OXYGEN Inhale 3 L/min into the lungs at bedtime as needed (for shortness of breath).     sacubitril-valsartan (ENTRESTO) 97-103 MG Take 1 tablet by mouth 2 (two) times daily. 180 tablet 3   spironolactone (ALDACTONE) 25 MG tablet Take 25 mg by  mouth daily.     TRESIBA FLEXTOUCH 100 UNIT/ML FlexTouch Pen Inject 30 Units into the skin daily.     Current Facility-Administered Medications  Medication Dose Route Frequency Provider Last Rate Last Admin   ezetimibe (ZETIA) tablet 10 mg  10 mg Oral Daily Setzer, Edman Circle, PA-C         Past Medical History:  Diagnosis Date   AICD (automatic cardioverter/defibrillator) present    Anginal pain ([place])    Anxiety    CAD (coronary artery disease)    CHF (congestive heart failure) (HCC)    Chronic back pain    Colon polyps    Diabetes mellitus    Heart attack ([place])    Hyperlipidemia    Hypertension    Neuropathy    Panic attacks    PVD (peripheral vascular disease) ([place])    Sleep apnea     ROS:   All systems reviewed and negative except as noted in the HPI.   Past Surgical History:  Procedure Laterality Date   ABDOMINAL AORTOGRAM W/LOWER EXTREMITY N/A 01/17/2021   Procedure: ABDOMINAL AORTOGRAM W/LOWER EXTREMITY;  Surgeon: Serafina Mitchell, MD;  Location: [facility];  Service: Cardiovascular;  Laterality: N/A;   ABDOMINAL AORTOGRAM W/LOWER EXTREMITY N/A  02/07/2021   Procedure: ABDOMINAL AORTOGRAM W/LOWER EXTREMITY;  Surgeon: Serafina Mitchell, MD;  Location: [facility];  Service: Cardiovascular;  Laterality: N/A;   APPLICATION OF A-CELL OF BACK N/A 12/09/2019   Procedure: APPLICATION OF A-CELL OF BACK;  Surgeon: Wonda Olds, MD;  [facility];  Service: Thoracic;  Laterality: N/A;   APPLICATION OF A-CELL OF CHEST/ABDOMEN N/A 12/04/2019   Procedure: APPLICATION OF A-CELL OF CHEST;  Surgeon: Wonda Olds, MD;  [facility];  Service: Thoracic;  Laterality: N/A;   APPLICATION OF WOUND VAC N/A 11/27/2019   Procedure: APPLICATION OF WOUND VAC;  Surgeon: Ivin Poot, MD;  Location: [place];  Service: Cardiovascular;  Laterality: N/A;  Lower midline superficial sternal wound area.   APPLICATION OF WOUND VAC N/A 12/09/2019   Procedure: WOUND VAC EXCHANGE;   Surgeon: Wonda Olds, MD;  [facility];  Service: Thoracic;  Laterality: N/A;   APPLICATION OF WOUND VAC N/A 12/14/2019   Procedure: WOUND VAC CHANGE;  Surgeon: Wonda Olds, MD;  [facility];  Service: Thoracic;  Laterality: N/A;   CORONARY ANGIOPLASTY WITH STENT PLACEMENT     CORONARY ARTERY BYPASS GRAFT N/A 10/29/2019   Procedure: CORONARY ARTERY BYPASS GRAFTING (CABG) using LIMA to LAD; RIMA to PDA; and Left radial harvest vein to Circ.;  Surgeon: Wonda Olds, MD;  Location: [place];  Service: Open Heart Surgery;  Laterality: N/A;   EAR CYST EXCISION  12/10/2011   Procedure: CYST REMOVAL;  Surgeon: Gae Bon, DDS;  Location: [facility];  Service: Oral Surgery;  Laterality: Left;  Left Mandible Cyst Removal   ICD IMPLANT N/A 10/26/2020   Procedure: ICD IMPLANT;  Surgeon: Evans Lance, MD;  Location: [facility] [facility];  Service: Cardiovascular;  Laterality: N/A;   IR THORACENTESIS ASP PLEURAL SPACE W/IMG GUIDE  10/27/2019   MULTIPLE EXTRACTIONS WITH ALVEOLOPLASTY  12/10/2011   Procedure: MULTIPLE EXTRACION WITH ALVEOLOPLASTY;  Surgeon: Gae Bon, DDS;  Location: [place];  Service: Oral Surgery;  Laterality: N/A;  Right Maxillary Tuberosity Reduction; Right  Maxillary Buccal Exostosis; Bilateral Mandibular Tori    PERIPHERAL VASCULAR BALLOON ANGIOPLASTY Right 02/07/2021   Procedure: PERIPHERAL VASCULAR BALLOON ANGIOPLASTY;  Surgeon: Serafina Mitchell, MD;  Location: [facility];  Service: Cardiovascular;  Laterality: Right;  Superficial femoral artery   PERIPHERAL VASCULAR CATHETERIZATION N/A 12/28/2014   Procedure: Abdominal Aortogram;  Surgeon: Serafina Mitchell, MD;  Location: [facility];  Service: Cardiovascular;  Laterality: N/A;   PERIPHERAL VASCULAR CATHETERIZATION N/A 12/20/2015   Procedure: Abdominal Aortogram;  Surgeon: Serafina Mitchell, MD;  Location: [facility];  Service: Cardiovascular;  Laterality: N/A;   PERIPHERAL VASCULAR CATHETERIZATION   12/20/2015   Procedure: Peripheral Vascular Balloon Angioplasty;  Surgeon: Serafina Mitchell, MD;  Location: [facility];  Service: Cardiovascular;;   PERIPHERAL VASCULAR INTERVENTION Left 01/17/2021   Procedure: PERIPHERAL VASCULAR INTERVENTION;  Surgeon: Serafina Mitchell, MD;  Location: [facility];  Service: Cardiovascular;  Laterality: Left;  Left popiteal and left SFA   RADIAL ARTERY HARVEST Left 10/29/2019   Procedure: RADIAL ARTERY HARVEST;  Surgeon: Wonda Olds, MD;  Location: [facility];  Service: Open Heart Surgery;  Laterality: Left;   RIGHT/LEFT HEART CATH AND CORONARY ANGIOGRAPHY N/A 10/28/2019   Procedure: RIGHT/LEFT HEART CATH AND CORONARY ANGIOGRAPHY;  Surgeon: Burnell Blanks, MD;  Location: [facility];  Service: Cardiovascular;  Laterality: N/A;  STERNAL WOUND DEBRIDEMENT N/A 12/01/2019   Procedure: STERNAL WOUND DEBRIDEMENT and WOUND VAC CHANGE;  Surgeon: Wonda Olds, MD;  Location: [facility];  Service: Thoracic;  Laterality: N/A;   STERNAL WOUND DEBRIDEMENT N/A 12/04/2019   Procedure: STERNAL WOUND DEBRIDEMENT and WOUND VAC CHANGE;  Surgeon: Wonda Olds, MD;  Location: [facility];  Service: Thoracic;  Laterality: N/A;   STERNAL WOUND DEBRIDEMENT N/A 12/09/2019   Procedure: STERNAL WOUND DEBRIDEMENT;  Surgeon: Wonda Olds, MD;  [facility];  Service: Thoracic;  Laterality: N/A;   TEE WITHOUT CARDIOVERSION N/A 10/29/2019   Procedure: TRANSESOPHAGEAL ECHOCARDIOGRAM (TEE);  Surgeon: Wonda Olds, MD;  Location: [facility];  Service: Open Heart Surgery;  Laterality: N/A;   TRANSCAROTID ARTERY REVASCULARIZATION  Right 12/01/2020   Procedure: RIGHT TRANSCAROTID ARTERY REVASCULARIZATION;  Surgeon: Serafina Mitchell, MD;  [facility];  Service: Vascular;  Laterality: Right;   TRANSCAROTID ARTERY REVASCULARIZATION  Left 12/30/2020   Procedure: LEFT TRANSCAROTID ARTERY REVASCULARIZATION;  Surgeon: Serafina Mitchell, MD;  [facility];  Service:  Vascular;  Laterality: Left;   ULTRASOUND GUIDANCE FOR VASCULAR ACCESS Left 12/01/2020   Procedure: ULTRASOUND GUIDANCE FOR VASCULAR ACCESS;  Surgeon: Serafina Mitchell, MD;  [facility];  Service: Vascular;  Laterality: Left;   ULTRASOUND GUIDANCE FOR VASCULAR ACCESS Right 12/30/2020   Procedure: ULTRASOUND GUIDANCE FOR VASCULAR ACCESS, RIGHT FEMORAL VEIN;  Surgeon: Serafina Mitchell, MD;  [facility];  Service: Vascular;  Laterality: Right;   WOUND EXPLORATION N/A 11/27/2019   Procedure: EXCISIONAL DEBRIDEMENT OF SUPERFICIAL STERNAL WOUND EXPLORATION;  Surgeon: Ivin Poot, MD;  Location: [facility];  Service: Cardiovascular;  Laterality: N/A;  Superficial sternal wound     Family History  Problem Relation Age of Onset   Diabetes Father    Kidney failure Father    Hypertension Father    Hyperlipidemia Father    Heart disease Father      Social History   Socioeconomic History   Marital status: Legally Separated    Spouse name: Not on file   Number of children: 3   Years of education: Not on file   Highest education level: Not on file  Occupational History    Comment: Unemployed  Tobacco Use   Smoking status: Former    Packs/day: 0.25    Years: 25.00    Pack years: 6.25    Types: Cigarettes    Start date: 1978    Quit date: 10/23/2019    Years since quitting: 1.3   Smokeless tobacco: Never   Tobacco comments:    1/4 pack per day  Vaping Use   Vaping Use: Never used  Substance and Sexual Activity   Alcohol use: No    Alcohol/week: 0.0 standard drinks   Drug use: No   Sexual activity: Not on file  Other Topics Concern   Not on file  Social History Narrative   Not on file   Social Determinants of Health   Financial Resource Strain: Low Risk    Difficulty of Paying Living Expenses: Not very hard  Food Insecurity: No Food Insecurity   Worried About Charity fundraiser in the Last Year: Never true   Ran Out of Food in the Last Year: Never true  Transportation  Needs: No Transportation Needs   Lack of Transportation (Medical): No   Lack of Transportation (Non-Medical): No  Physical Activity: Not on file  Stress: Not on file  Social Connections: Not on file  Intimate Partner Violence: Not on file     BP (!) 142/70   Pulse 72   Ht 5\' 7"  (1.702 m)   Wt 221 lb 9.6 oz (100.5 kg)   SpO2 97%   BMI 34.71 kg/m   Physical Exam:  Well appearing NAD HEENT: Unremarkable Neck:  No JVD, no thyromegally; bilateral CEA incisions, well healed Lymphatics:  No adenopathy Back:  No CVA tenderness Lungs:  Clear with no wheezes HEART:  Regular rate rhythm, no murmurs, no rubs, no clicks Abd:  soft, positive bowel sounds, no organomegally, no rebound, no guarding Ext:  2 plus pulses, no edema, no cyanosis, no clubbing Skin:  No rashes no nodules Neuro:  CN II through XII intact, motor grossly intact  EKG - nsr  DEVICE  Normal device function.  See PaceArt for details.   A/P Chronic systolic heart failure -his symptoms are class 2. He will continue maximal medical therapy Obesity - I have asked him to start walking. He admits to being sedentary and to overeating.  ICM - he is s/p MI and denies anginal symptoms. ICD - his [employer] VDD ICD is working normally. No ICD therapies.  Carleene Overlie Elizabella Nolet,MD

## 2021-03-07 LAB — CUP PACEART INCLINIC DEVICE CHECK
Battery Voltage: 3.12 V
Brady Statistic RV Percent Paced: 0 %
Date Time Interrogation Session: 20221110123200
HighPow Impedance: 85 Ohm
Implantable Lead Implant Date: 20220706
Implantable Lead Location: 753860
Implantable Lead Model: 436909
Implantable Lead Serial Number: 81441117
Implantable Pulse Generator Implant Date: 20220706
Lead Channel Impedance Value: 873 Ohm
Lead Channel Pacing Threshold Amplitude: 0.5 V
Lead Channel Pacing Threshold Pulse Width: 0.4 ms
Lead Channel Sensing Intrinsic Amplitude: 24.2 mV
Lead Channel Sensing Intrinsic Amplitude: 3.1 mV
Lead Channel Setting Pacing Amplitude: 2 V
Lead Channel Setting Pacing Pulse Width: 0.4 ms
Lead Channel Setting Sensing Sensitivity: 0.8 mV
Pulse Gen Model: 429525
Pulse Gen Serial Number: 84841030

## 2021-03-13 ENCOUNTER — Ambulatory Visit (INDEPENDENT_AMBULATORY_CARE_PROVIDER_SITE_OTHER): Payer: Medicaid Other | Admitting: Physician Assistant

## 2021-03-13 ENCOUNTER — Ambulatory Visit (INDEPENDENT_AMBULATORY_CARE_PROVIDER_SITE_OTHER)
Admit: 2021-03-13 | Discharge: 2021-03-13 | Disposition: A | Discharge status: Home / Self Care | Payer: Medicaid Other | Attending: Vascular Surgery | Admitting: Vascular Surgery

## 2021-03-13 ENCOUNTER — Ambulatory Visit (HOSPITAL_COMMUNITY)
Admission: RE | Admit: 2021-03-13 | Discharge: 2021-03-13 | Disposition: A | Discharge status: Home / Self Care | Payer: Medicaid Other | Source: Ambulatory Visit | Attending: Vascular Surgery | Admitting: Vascular Surgery

## 2021-03-13 ENCOUNTER — Other Ambulatory Visit: Payer: Self-pay

## 2021-03-13 VITALS — BP 138/82 | HR 62 | Temp 97.7°F | Resp 20 | Ht 67.0 in | Wt 222.3 lb

## 2021-03-13 DIAGNOSIS — I739 Peripheral vascular disease, unspecified: Secondary | ICD-10-CM

## 2021-03-13 DIAGNOSIS — I70213 Atherosclerosis of native arteries of extremities with intermittent claudication, bilateral legs: Secondary | ICD-10-CM

## 2021-03-13 DIAGNOSIS — I724 Aneurysm of artery of lower extremity: Secondary | ICD-10-CM | POA: Diagnosis not present

## 2021-03-13 NOTE — Progress Notes (Signed)
Office Note     CC:  follow up Requesting Provider:  Concepcion Elk, MD  HPI: [name] is a 60 y.o. (09-11-1960) male who presents status post staged left SFA stent and right SFA stent balloon angioplasty by Dr. Trula Slade.  He also required thrombin injection of right groin pseudoaneurysm by Dr. Stanford Breed which has since completely resolved.  Bilateral lower extremity interventions were performed due to lifestyle limiting claudication which has since resolved.  He is on Eliquis and Plavix daily.  He denies tobacco use.  He admittedly has not been walking much lately due to having to care for his mother who recently had a stroke.   Past Medical History:  Diagnosis Date   AICD (automatic cardioverter/defibrillator) present    Anginal pain ([place])    Anxiety    CAD (coronary artery disease)    CHF (congestive heart failure) (HCC)    Chronic back pain    Colon polyps    Diabetes mellitus    Heart attack ([facility])    Hyperlipidemia    Hypertension    Neuropathy    Panic attacks    PVD (peripheral vascular disease) ([place])    Sleep apnea     Past Surgical History:  Procedure Laterality Date   ABDOMINAL AORTOGRAM W/LOWER EXTREMITY N/A 01/17/2021   Procedure: ABDOMINAL AORTOGRAM W/LOWER EXTREMITY;  Surgeon: Serafina Mitchell, MD;  Location: [facility];  Service: Cardiovascular;  Laterality: N/A;   ABDOMINAL AORTOGRAM W/LOWER EXTREMITY N/A 02/07/2021   Procedure: ABDOMINAL AORTOGRAM W/LOWER EXTREMITY;  Surgeon: Serafina Mitchell, MD;  Location: [facility];  Service: Cardiovascular;  Laterality: N/A;   APPLICATION OF A-CELL OF BACK N/A 12/09/2019   Procedure: APPLICATION OF A-CELL OF BACK;  Surgeon: Wonda Olds, MD;  [facility];  Service: Thoracic;  Laterality: N/A;   APPLICATION OF A-CELL OF CHEST/ABDOMEN N/A 12/04/2019   Procedure: APPLICATION OF A-CELL OF CHEST;  Surgeon: Wonda Olds, MD;  [facility];  Service: Thoracic;  Laterality: N/A;   APPLICATION OF  WOUND VAC N/A 11/27/2019   Procedure: APPLICATION OF WOUND VAC;  Surgeon: Ivin Poot, MD;  Location: [facility];  Service: Cardiovascular;  Laterality: N/A;  Lower midline superficial sternal wound area.   APPLICATION OF WOUND VAC N/A 12/09/2019   Procedure: WOUND VAC EXCHANGE;  Surgeon: Wonda Olds, MD;  [facility];  Service: Thoracic;  Laterality: N/A;   APPLICATION OF WOUND VAC N/A 12/14/2019   Procedure: WOUND VAC CHANGE;  Surgeon: Wonda Olds, MD;  [facility];  Service: Thoracic;  Laterality: N/A;   CORONARY ANGIOPLASTY WITH STENT PLACEMENT     CORONARY ARTERY BYPASS GRAFT N/A 10/29/2019   Procedure: CORONARY ARTERY BYPASS GRAFTING (CABG) using LIMA to LAD; RIMA to PDA; and Left radial harvest vein to Circ.;  Surgeon: Wonda Olds, MD;  Location: [facility];  Service: Open Heart Surgery;  Laterality: N/A;   EAR CYST EXCISION  12/10/2011   Procedure: CYST REMOVAL;  Surgeon: Gae Bon, DDS;  Location: [facility];  Service: Oral Surgery;  Laterality: Left;  Left Mandible Cyst Removal   ICD IMPLANT N/A 10/26/2020   Procedure: ICD IMPLANT;  Surgeon: Evans Lance, MD;  Location: [facility];  Service: Cardiovascular;  Laterality: N/A;   IR THORACENTESIS ASP PLEURAL SPACE W/IMG GUIDE  10/27/2019   MULTIPLE EXTRACTIONS WITH ALVEOLOPLASTY  12/10/2011   Procedure: MULTIPLE EXTRACION WITH ALVEOLOPLASTY;  Surgeon: Gae Bon, DDS;  Location:  [facility];  Service: Oral Surgery;  Laterality: N/A;  Right Maxillary Tuberosity Reduction; Right  Maxillary Buccal Exostosis; Bilateral Mandibular Tori    PERIPHERAL VASCULAR BALLOON ANGIOPLASTY Right 02/07/2021   Procedure: PERIPHERAL VASCULAR BALLOON ANGIOPLASTY;  Surgeon: Serafina Mitchell, MD;  Location: [facility];  Service: Cardiovascular;  Laterality: Right;  Superficial femoral artery   PERIPHERAL VASCULAR CATHETERIZATION N/A 12/28/2014   Procedure: Abdominal Aortogram;  Surgeon: Serafina Mitchell, MD;  Location: [facility];   Service: Cardiovascular;  Laterality: N/A;   PERIPHERAL VASCULAR CATHETERIZATION N/A 12/20/2015   Procedure: Abdominal Aortogram;  Surgeon: Serafina Mitchell, MD;  Location: [facility];  Service: Cardiovascular;  Laterality: N/A;   PERIPHERAL VASCULAR CATHETERIZATION  12/20/2015   Procedure: Peripheral Vascular Balloon Angioplasty;  Surgeon: Serafina Mitchell, MD;  Location: [facility];  Service: Cardiovascular;;   PERIPHERAL VASCULAR INTERVENTION Left 01/17/2021   Procedure: PERIPHERAL VASCULAR INTERVENTION;  Surgeon: Serafina Mitchell, MD;  Location: [facility];  Service: Cardiovascular;  Laterality: Left;  Left popiteal and left SFA   RADIAL ARTERY HARVEST Left 10/29/2019   Procedure: RADIAL ARTERY HARVEST;  Surgeon: Wonda Olds, MD;  Location: [facility];  Service: Open Heart Surgery;  Laterality: Left;   RIGHT/LEFT HEART CATH AND CORONARY ANGIOGRAPHY N/A 10/28/2019   Procedure: RIGHT/LEFT HEART CATH AND CORONARY ANGIOGRAPHY;  Surgeon: Burnell Blanks, MD;  Location: [facility];  Service: Cardiovascular;  Laterality: N/A;   STERNAL WOUND DEBRIDEMENT N/A 12/01/2019   Procedure: STERNAL WOUND DEBRIDEMENT and WOUND VAC CHANGE;  Surgeon: Wonda Olds, MD;  Location: [facility];  Service: Thoracic;  Laterality: N/A;   STERNAL WOUND DEBRIDEMENT N/A 12/04/2019   Procedure: STERNAL WOUND DEBRIDEMENT and WOUND VAC CHANGE;  Surgeon: Wonda Olds, MD;  Location: [facility];  Service: Thoracic;  Laterality: N/A;   STERNAL WOUND DEBRIDEMENT N/A 12/09/2019   Procedure: STERNAL WOUND DEBRIDEMENT;  Surgeon: Wonda Olds, MD;  [facility];  Service: Thoracic;  Laterality: N/A;   TEE WITHOUT CARDIOVERSION N/A 10/29/2019   Procedure: TRANSESOPHAGEAL ECHOCARDIOGRAM (TEE);  Surgeon: Wonda Olds, MD;  Location: [facility];  Service: Open Heart Surgery;  Laterality: N/A;   TRANSCAROTID ARTERY REVASCULARIZATION  Right 12/01/2020   Procedure: RIGHT TRANSCAROTID ARTERY  REVASCULARIZATION;  Surgeon: Serafina Mitchell, MD;  [facility];  Service: Vascular;  Laterality: Right;   TRANSCAROTID ARTERY REVASCULARIZATION  Left 12/30/2020   Procedure: LEFT TRANSCAROTID ARTERY REVASCULARIZATION;  Surgeon: Serafina Mitchell, MD;  [facility];  Service: Vascular;  Laterality: Left;   ULTRASOUND GUIDANCE FOR VASCULAR ACCESS Left 12/01/2020   Procedure: ULTRASOUND GUIDANCE FOR VASCULAR ACCESS;  Surgeon: Serafina Mitchell, MD;  [facility];  Service: Vascular;  Laterality: Left;   ULTRASOUND GUIDANCE FOR VASCULAR ACCESS Right 12/30/2020   Procedure: ULTRASOUND GUIDANCE FOR VASCULAR ACCESS, RIGHT FEMORAL VEIN;  Surgeon: Serafina Mitchell, MD;  [facility];  Service: Vascular;  Laterality: Right;   WOUND EXPLORATION N/A 11/27/2019   Procedure: EXCISIONAL DEBRIDEMENT OF SUPERFICIAL STERNAL WOUND EXPLORATION;  Surgeon: Ivin Poot, MD;  Location: [place];  Service: Cardiovascular;  Laterality: N/A;  Superficial sternal wound    Social History   Socioeconomic History   Marital status: Legally Separated    Spouse name: Not on file   Number of children: 3   Years of education: Not on file   Highest education level: Not on file  Occupational History  Comment: Unemployed  Tobacco Use   Smoking status: Former    Packs/day: 0.25    Years: 25.00    Pack years: 6.25    Types: Cigarettes    Start date: 72    Quit date: 10/23/2019    Years since quitting: 1.3   Smokeless tobacco: Never   Tobacco comments:    1/4 pack per day  Vaping Use   Vaping Use: Never used  Substance and Sexual Activity   Alcohol use: No    Alcohol/week: 0.0 standard drinks   Drug use: No   Sexual activity: Not on file  Other Topics Concern   Not on file  Social History Narrative   Not on file   Social Determinants of Health   Financial Resource Strain: Low Risk    Difficulty of Paying Living Expenses: Not very hard  Food Insecurity: No Food Insecurity   Worried About Ship broker in the Last Year: Never true   Ran Out of Food in the Last Year: Never true  Transportation Needs: No Transportation Needs   Lack of Transportation (Medical): No   Lack of Transportation (Non-Medical): No  Physical Activity: Not on file  Stress: Not on file  Social Connections: Not on file  Intimate Partner Violence: Not on file    Family History  Problem Relation Age of Onset   Diabetes Father    Kidney failure Father    Hypertension Father    Hyperlipidemia Father    Heart disease Father     Current Outpatient Medications  Medication Sig Dispense Refill   ACCU-CHEK GUIDE test strip      amLODipine (NORVASC) 5 MG tablet Take 5 mg by mouth daily.     apixaban (ELIQUIS) 5 MG TABS tablet Take 5 mg by mouth 2 (two) times daily.     atorvastatin (LIPITOR) 40 MG tablet Take 40 mg by mouth daily.     carvedilol (COREG) 25 MG tablet Take 25 mg by mouth 2 (two) times daily with a meal.     clopidogrel (PLAVIX) 75 MG tablet Take 1 tablet (75 mg total) by mouth daily. 30 tablet 11   Dulaglutide 1.5 MG/0.5ML SOPN Inject 1.5 mg into the skin once a week.     empagliflozin (JARDIANCE) 25 MG TABS tablet Take 1 tablet (25 mg total) by mouth daily before breakfast. 30 tablet    furosemide (LASIX) 40 MG tablet Take 40 mg by mouth daily.     metFORMIN (GLUCOPHAGE) 1000 MG tablet Take 1,000 mg by mouth 2 (two) times daily.     oxyCODONE-acetaminophen (PERCOCET) 5-325 MG tablet Take 1 tablet by mouth every 4 (four) hours as needed for severe pain. 20 tablet 0   OXYGEN Inhale 3 L/min into the lungs at bedtime as needed (for shortness of breath).     sacubitril-valsartan (ENTRESTO) 97-103 MG Take 1 tablet by mouth 2 (two) times daily. 180 tablet 3   spironolactone (ALDACTONE) 25 MG tablet Take 25 mg by mouth daily.     TRESIBA FLEXTOUCH 100 UNIT/ML FlexTouch Pen Inject 30 Units into the skin daily.     ezetimibe (ZETIA) 10 MG tablet Take 1 tablet (10 mg total) by mouth daily. 30 tablet 2    Current Facility-Administered Medications  Medication Dose Route Frequency Provider Last Rate Last Admin   ezetimibe (ZETIA) tablet 10 mg  10 mg Oral Daily Setzer, Edman Circle, PA-C        Allergies  Allergen Reactions   Gabapentin  Other (See Comments)    Suicidal thoughts, extreme dreams   Pregabalin Other (See Comments)    Suicidal thoughts, extreme dreams    Simvastatin Other (See Comments)    Pain in joints   Lisinopril Itching and Rash   Venlafaxine Rash     REVIEW OF SYSTEMS:   [X]  denotes positive finding, [ ]  denotes negative finding Cardiac  Comments:  Chest pain or chest pressure:    Shortness of breath upon exertion:    Short of breath when lying flat:    Irregular heart rhythm:        Vascular    Pain in calf, thigh, or hip brought on by ambulation:    Pain in feet at night that wakes you up from your sleep:     Blood clot in your veins:    Leg swelling:         Pulmonary    Oxygen at home:    Productive cough:     Wheezing:         Neurologic    Sudden weakness in arms or legs:     Sudden numbness in arms or legs:     Sudden onset of difficulty speaking or slurred speech:    Temporary loss of vision in one eye:     Problems with dizziness:         Gastrointestinal    Blood in stool:     Vomited blood:         Genitourinary    Burning when urinating:     Blood in urine:        Psychiatric    Major depression:         Hematologic    Bleeding problems:    Problems with blood clotting too easily:        Skin    Rashes or ulcers:        Constitutional    Fever or chills:      PHYSICAL EXAMINATION:  Vitals:   03/13/21 1100  BP: 138/82  Pulse: 62  Resp: 20  Temp: 97.7 F (36.5 C)  TempSrc: Temporal  SpO2: 96%  Weight: 222 lb 4.8 oz (100.8 kg)  Height: 5\' 7"  (1.702 m)    General:  WDWN in NAD; vital signs documented above Gait: Not observed HENT: WNL, normocephalic Pulmonary: normal non-labored breathing Cardiac: regular  HR Abdomen: soft, NT, no masses Skin: without rashes Vascular Exam/Pulses:  Right Left  Radial 2+ (normal) 2+ (normal)  Palpable and symmetrical PT pulses Extremities: without ischemic changes, without Gangrene , without cellulitis; without open wounds;  Musculoskeletal: no muscle wasting or atrophy  Neurologic: A&O X 3;  No focal weakness or paresthesias are detected Psychiatric:  The pt has Normal affect.   Non-Invasive Vascular Imaging:   Right SFA stent widely patent Left SFA stent widely patent No right CFA pseudoaneurysm noted    ASSESSMENT/PLAN:: 60 y.o. male here status post left SFA stenting followed by right SFA stent balloon angioplasty due to bilateral lower extremity claudication symptoms  -Subjectively, claudication symptoms have since resolved -Bilateral lower extremity arterial duplex shows widely patent SFA stents -Continue Eliquis; okay to switch from Plavix to aspirin -Recheck bilateral lower extremity arterial duplex and ABIs in 6 months   Lee Ligas, PA-C [facility] [phone]  Clinic MD:   Virl Cagey

## 2021-03-20 ENCOUNTER — Other Ambulatory Visit: Payer: Self-pay

## 2021-03-20 DIAGNOSIS — I739 Peripheral vascular disease, unspecified: Secondary | ICD-10-CM

## 2021-04-26 LAB — CUP PACEART REMOTE DEVICE CHECK
Battery Voltage: 3.11 V
Brady Statistic RV Percent Paced: 0 %
Date Time Interrogation Session: 20230103102119
HighPow Impedance: 90 Ohm
Implantable Lead Implant Date: 20220706
Implantable Lead Location: 753860
Implantable Lead Model: 436909
Implantable Lead Serial Number: 81441117
Implantable Pulse Generator Implant Date: 20220706
Lead Channel Impedance Value: 756 Ohm
Lead Channel Setting Pacing Amplitude: 2 V
Lead Channel Setting Pacing Pulse Width: 0.4 ms
Lead Channel Setting Sensing Sensitivity: 0.8 mV
Pulse Gen Model: 429525
Pulse Gen Serial Number: 84841030

## 2021-04-27 ENCOUNTER — Ambulatory Visit (INDEPENDENT_AMBULATORY_CARE_PROVIDER_SITE_OTHER): Payer: Medicaid Other

## 2021-04-27 DIAGNOSIS — I255 Ischemic cardiomyopathy: Secondary | ICD-10-CM | POA: Diagnosis not present

## 2021-05-02 ENCOUNTER — Other Ambulatory Visit: Payer: Self-pay | Admitting: Physician Assistant

## 2021-05-08 NOTE — Progress Notes (Signed)
Remote ICD transmission.   

## 2021-07-18 ENCOUNTER — Other Ambulatory Visit: Payer: Self-pay | Admitting: Cardiology

## 2021-07-18 NOTE — Progress Notes (Signed)
?Cardiology Office Note:   ? ?Date:  07/19/2021  ? ?ID:  ADONI GREENOUGH, DOB 1960-12-16, MRN [id number] ? ?PCP:  Concepcion Elk, MD ?[place] Cardiologist: Kirk Ruths, MD  ? ?Reason for visit: 75-monthfollow-up ? ?History of Present Illness:   ? ?MSUN WILENSKYis a 61y.o. male with a hx of CAD s/p CABG LIMA to the LAD and RIMA to the PDA July 2021, ischemic cardiomyopathy status post ICD, chronic systolic heart failure, paroxysmal atrial fibrillation, hypertension, carotid artery disease, peripheral vascular disease, hyperlipidemia.  Postoperative course complicated by atrial fibrillation.  Echocardiogram 2/22 showed ejection fraction 25 to 30%, mild left ventricular hypertrophy, restrictive filling, moderate RV dysfunction, severe left atrial enlargement, mild tricuspid regurgitation.  CTA February 2022 showed bilateral pleural effusions, mild interstitial edema.  Patient had ICD implanted June 2022. ? ?[place] in July 2022.  He was doing well with no dyspnea or chest pain.  No change in therapy.   ? ?Patient states he is doing well other than feeling fat.  He is try to improve his diet with more vegetables and using air FRolly Salter  He states his hemoglobin A1c has improved from 9.6% to 8.1% per his report.  He states he plans to be more active this summer.  He is interested in referral to healthy weight and wellness. ? ?He denies chest pain, shortness of breath, PND, orthopnea, lower extreme edema, lightheadedness, syncope, palpitations and bleeding issues.  He has no issues with his medications.  He quit smoking 2 years ago. ? ?  ?Past Medical History:  ?Diagnosis Date  ? AICD (automatic cardioverter/defibrillator) present   ? Anginal pain ([place]   ? Anxiety   ? CAD (coronary artery disease)   ? CHF (congestive heart failure) ([place]   ? Chronic back pain   ? Colon polyps   ? Diabetes mellitus   ? Heart attack ([place]   ? Hyperlipidemia   ? Hypertension   ? Neuropathy   ? Panic attacks   ? PVD  (peripheral vascular disease) ([place]   ? Sleep apnea   ? ? ?Past Surgical History:  ?Procedure Laterality Date  ? ABDOMINAL AORTOGRAM W/LOWER EXTREMITY N/A 01/17/2021  ? Procedure: ABDOMINAL AORTOGRAM W/LOWER EXTREMITY;  Surgeon: BSerafina Mitchell MD;  [facility];  Service: Cardiovascular;  Laterality: N/A;  ? ABDOMINAL AORTOGRAM W/LOWER EXTREMITY N/A 02/07/2021  ? Procedure: ABDOMINAL AORTOGRAM W/LOWER EXTREMITY;  Surgeon: BSerafina Mitchell MD;  [facility];  Service: Cardiovascular;  Laterality: N/A;  ? APPLICATION OF A-CELL OF BACK N/A 12/09/2019  ? Procedure: APPLICATION OF A-CELL OF BACK;  Surgeon: AWonda Olds MD;  [facility];  Service: Thoracic;  Laterality: N/A;  ? APPLICATION OF A-CELL OF CHEST/ABDOMEN N/A 12/04/2019  ? Procedure: APPLICATION OF A-CELL OF CHEST;  Surgeon: AWonda Olds MD;  [facility];  Service: Thoracic;  Laterality: N/A;  ? APPLICATION OF WOUND VAC N/A 11/27/2019  ? Procedure: APPLICATION OF WOUND VAC;  Surgeon: VPrescott Gum PCollier Salina MD;  [place] [facility]  Service: Cardiovascular;  Laterality: N/A;  Lower midline superficial sternal wound area.  ? APPLICATION OF WOUND VAC N/A 12/09/2019  ? Procedure: WOUND VAC EXCHANGE;  Surgeon: AWonda Olds MD;  [facility];  Service: Thoracic;  Laterality: N/A;  ? APPLICATION OF WOUND VAC N/A 12/14/2019  ? Procedure: WOUND VAC CHANGE;  Surgeon: AWonda Olds MD;  [place]  Service: Thoracic;  Laterality: N/A;  ?  CORONARY ANGIOPLASTY WITH STENT PLACEMENT    ? CORONARY ARTERY BYPASS GRAFT N/A 10/29/2019  ? Procedure: CORONARY ARTERY BYPASS GRAFTING (CABG) using LIMA to LAD; RIMA to PDA; and Left radial harvest vein to Circ.;  Surgeon: Wonda Olds, MD;  Location: [place];  Service: Open Heart Surgery;  Laterality: N/A;  ? EAR CYST EXCISION  12/10/2011  ? Procedure: CYST REMOVAL;  Surgeon: Gae Bon, DDS;  Location: [place];  Service: Oral Surgery;  Laterality: Left;  Left Mandible Cyst  Removal  ? ICD IMPLANT N/A 10/26/2020  ? Procedure: ICD IMPLANT;  Surgeon: Evans Lance, MD;  Location: [place] [facility];  Service: Cardiovascular;  Laterality: N/A;  ? IR THORACENTESIS ASP PLEURAL SPACE W/IMG GUIDE  10/27/2019  ? MULTIPLE EXTRACTIONS WITH ALVEOLOPLASTY  12/10/2011  ? Procedure: MULTIPLE EXTRACION WITH ALVEOLOPLASTY;  Surgeon: Gae Bon, DDS;  Location: [place];  Service: Oral Surgery;  Laterality: N/A;  Right Maxillary Tuberosity Reduction; Right  ?Maxillary Buccal Exostosis; Bilateral Mandibular Tori   ? PERIPHERAL VASCULAR BALLOON ANGIOPLASTY Right 02/07/2021  ? Procedure: PERIPHERAL VASCULAR BALLOON ANGIOPLASTY;  Surgeon: Serafina Mitchell, MD;  Location: [facility] [facility];  Service: Cardiovascular;  Laterality: Right;  Superficial femoral artery  ? PERIPHERAL VASCULAR CATHETERIZATION N/A 12/28/2014  ? Procedure: Abdominal Aortogram;  Surgeon: Serafina Mitchell, MD;  Location: [place] [facility];  Service: Cardiovascular;  Laterality: N/A;  ? PERIPHERAL VASCULAR CATHETERIZATION N/A 12/20/2015  ? Procedure: Abdominal Aortogram;  Surgeon: Serafina Mitchell, MD;  Location: [facility] [facility];  Service: Cardiovascular;  Laterality: N/A;  ? PERIPHERAL VASCULAR CATHETERIZATION  12/20/2015  ? Procedure: Peripheral Vascular Balloon Angioplasty;  Surgeon: Serafina Mitchell, MD;  Location: [place] [facility];  Service: Cardiovascular;;  ? PERIPHERAL VASCULAR INTERVENTION Left 01/17/2021  ? Procedure: PERIPHERAL VASCULAR INTERVENTION;  Surgeon: Serafina Mitchell, MD;  Location: [place] [facility];  Service: Cardiovascular;  Laterality: Left;  Left popiteal and left SFA  ? RADIAL ARTERY HARVEST Left 10/29/2019  ? Procedure: RADIAL ARTERY HARVEST;  Surgeon: Wonda Olds, MD;  Location: [place];  Service: Open Heart Surgery;  Laterality: Left;  ? RIGHT/LEFT HEART CATH AND CORONARY ANGIOGRAPHY N/A 10/28/2019  ? Procedure: RIGHT/LEFT HEART CATH AND CORONARY ANGIOGRAPHY;  Surgeon: Burnell Blanks, MD;  Location:  [place] [facility];  Service: Cardiovascular;  Laterality: N/A;  ? STERNAL WOUND DEBRIDEMENT N/A 12/01/2019  ? Procedure: STERNAL WOUND DEBRIDEMENT and WOUND VAC CHANGE;  Surgeon: Wonda Olds, MD;  Location: [place];  Service: Thoracic;  Laterality: N/A;  ? STERNAL WOUND DEBRIDEMENT N/A 12/04/2019  ? Procedure: STERNAL WOUND DEBRIDEMENT and WOUND VAC CHANGE;  Surgeon: Wonda Olds, MD;  Location: [place];  Service: Thoracic;  Laterality: N/A;  ? STERNAL WOUND DEBRIDEMENT N/A 12/09/2019  ? Procedure: STERNAL WOUND DEBRIDEMENT;  Surgeon: Wonda Olds, MD;  Location: [place];  Service: Thoracic;  Laterality: N/A;  ? TEE WITHOUT CARDIOVERSION N/A 10/29/2019  ? Procedure: TRANSESOPHAGEAL ECHOCARDIOGRAM (TEE);  Surgeon: Wonda Olds, MD;  Location: [place];  Service: Open Heart Surgery;  Laterality: N/A;  ? TRANSCAROTID ARTERY REVASCULARIZATION?  Right 12/01/2020  ? Procedure: RIGHT TRANSCAROTID ARTERY REVASCULARIZATION;  Surgeon: Serafina Mitchell, MD;  [facility];  Service: Vascular;  Laterality: Right;  ? TRANSCAROTID ARTERY REVASCULARIZATION?  Left 12/30/2020  ? Procedure: LEFT TRANSCAROTID ARTERY REVASCULARIZATION;  Surgeon: Serafina Mitchell, MD;  [facility];  Service: Vascular;  Laterality: Left;  ? ULTRASOUND GUIDANCE  FOR VASCULAR ACCESS Left 12/01/2020  ? Procedure: ULTRASOUND GUIDANCE FOR VASCULAR ACCESS;  Surgeon: Serafina Mitchell, MD;  Location[facility];  Service: Vascular;  Laterality: Left;  ? ULTRASOUND GUIDANCE FOR VASCULAR ACCESS Right 12/30/2020  ? Procedure: ULTRASOUND GUIDANCE FOR VASCULAR ACCESS, RIGHT FEMORAL VEIN;  Surgeon: Serafina Mitchell, MD;  Location: [place];  Service: Vascular;  Laterality: Right;  ? WOUND EXPLORATION N/A 11/27/2019  ? Procedure: EXCISIONAL DEBRIDEMENT OF SUPERFICIAL STERNAL WOUND EXPLORATION;  Surgeon: Ivin Poot, MD;  Location: [place];  Service: Cardiovascular;  Laterality: N/A;  Superficial sternal wound  ? ? ?Current Medications: ?Current Meds  ?Medication Sig  ?  ACCU-CHEK GUIDE test strip   ? amLODipine (NORVASC) 5 MG tablet Take 5 mg by mouth daily.  ? apixaban (ELIQUIS) 5 MG TABS tablet Take 5 mg by mouth 2 (two) times daily.  ? atorvastatin (LIPITOR) 40 MG tabl

## 2021-07-19 ENCOUNTER — Other Ambulatory Visit: Payer: Self-pay

## 2021-07-19 ENCOUNTER — Ambulatory Visit (INDEPENDENT_AMBULATORY_CARE_PROVIDER_SITE_OTHER): Payer: Medicaid Other | Admitting: Physician Assistant

## 2021-07-19 VITALS — BP 128/76 | HR 60 | Ht 67.0 in | Wt 225.0 lb

## 2021-07-19 DIAGNOSIS — E669 Obesity, unspecified: Secondary | ICD-10-CM

## 2021-07-19 DIAGNOSIS — I480 Paroxysmal atrial fibrillation: Secondary | ICD-10-CM

## 2021-07-19 DIAGNOSIS — I5022 Chronic systolic (congestive) heart failure: Secondary | ICD-10-CM | POA: Diagnosis not present

## 2021-07-19 DIAGNOSIS — I255 Ischemic cardiomyopathy: Secondary | ICD-10-CM

## 2021-07-19 DIAGNOSIS — E7800 Pure hypercholesterolemia, unspecified: Secondary | ICD-10-CM

## 2021-07-19 DIAGNOSIS — I70213 Atherosclerosis of native arteries of extremities with intermittent claudication, bilateral legs: Secondary | ICD-10-CM | POA: Diagnosis not present

## 2021-07-19 DIAGNOSIS — Z951 Presence of aortocoronary bypass graft: Secondary | ICD-10-CM

## 2021-07-19 MED ORDER — FUROSEMIDE 40 MG PO TABS: 40.0000 mg | ORAL_TABLET | Freq: Every day | ORAL | 3 refills | Status: DC

## 2021-07-19 NOTE — Patient Instructions (Signed)
Medication Instructions:  ?No Changes ?*If you need a refill on your cardiac medications before your next appointment, please call your pharmacy* ? ? ?Lab Work: ?BMET: Today ?If you have labs (blood work) drawn today and your tests are completely normal, you will receive your results only by: ?MyChart Message (if you have MyChart) OR ?A paper copy in the mail ?If you have any lab test that is abnormal or we need to change your treatment, we will call you to review the results. ? ? ?Testing/Procedures: ?No Testing ? ? ?Follow-Up: ?At [facility], you and your health needs are our priority.  As part of our continuing mission to provide you with exceptional heart care, we have created designated Provider Care Teams.  These Care Teams include your primary Cardiologist (physician) and Advanced Practice Providers (APPs -  Physician Assistants and Nurse Practitioners) who all work together to provide you with the care you need, when you need it. ? ?We recommend signing up for the patient portal called "MyChart".  Sign up information is provided on this After Visit Summary.  MyChart is used to connect with patients for Virtual Visits (Telemedicine).  Patients are able to view lab/test results, encounter notes, upcoming appointments, etc.  Non-urgent messages can be sent to your provider as well.   ?To learn more about what you can do with MyChart, go to [email or web address].   ? ?Your next appointment:   ?6 month(s) ? ?The format for your next appointment:   ?In Person ? ?Provider:   ?Kirk Ruths, MD   ? ? ?  ?

## 2021-07-20 ENCOUNTER — Other Ambulatory Visit: Payer: Self-pay

## 2021-07-20 ENCOUNTER — Telehealth: Payer: Self-pay

## 2021-07-20 DIAGNOSIS — I10 Essential (primary) hypertension: Secondary | ICD-10-CM

## 2021-07-20 LAB — BASIC METABOLIC PANEL
BUN/Creatinine Ratio: 22 — ABNORMAL HIGH (ref 9–20)
BUN: 25 mg/dL — ABNORMAL HIGH (ref 6–24)
CO2: 25 mmol/L (ref 20–29)
Calcium: 10 mg/dL (ref 8.7–10.2)
Chloride: 98 mmol/L (ref 96–106)
Creatinine, Ser: 1.16 mg/dL (ref 0.76–1.27)
Glucose: 221 mg/dL — ABNORMAL HIGH (ref 70–99)
Potassium: 6 mmol/L (ref 3.5–5.2)
Sodium: 141 mmol/L (ref 134–144)
eGFR: 73 mL/min/{1.73_m2} (ref >59)

## 2021-07-20 NOTE — Telephone Encounter (Signed)
Received critical lab value from [facility]. Yesterday potassium 6.0. Patient not answering phone. [place]. Lab value handed to J. Quentin Ore. ?

## 2021-07-20 NOTE — Telephone Encounter (Addendum)
Called patient regarding results. Advised patient to hold spirolactone continue Lasix . Patient will come in on Monday for repeat BMET. Patient had understanding of results and instruction to follow.----- Message from Warren Lacy, PA-C sent at 07/20/2021 12:45 PM EDT ----- ?Left voicemail for patient call back the office.  Potassium 6.0. (Normal kidney function) ?Recommendations: ?-Ensure patient is not taking any potassium supplement ?-Follow low potassium diet ?-Hold spironolactone.  (Continue Lasix) ?-Check basic metabolic panel again on Monday.   ? ?

## 2021-07-24 ENCOUNTER — Other Ambulatory Visit: Payer: Self-pay

## 2021-07-24 DIAGNOSIS — I10 Essential (primary) hypertension: Secondary | ICD-10-CM

## 2021-07-24 LAB — BASIC METABOLIC PANEL
BUN/Creatinine Ratio: 24 — ABNORMAL HIGH (ref 9–20)
BUN: 26 mg/dL — ABNORMAL HIGH (ref 6–24)
CO2: 25 mmol/L (ref 20–29)
Calcium: 9.7 mg/dL (ref 8.7–10.2)
Chloride: 96 mmol/L (ref 96–106)
Creatinine, Ser: 1.1 mg/dL (ref 0.76–1.27)
Glucose: 250 mg/dL — ABNORMAL HIGH (ref 70–99)
Potassium: 5 mmol/L (ref 3.5–5.2)
Sodium: 137 mmol/L (ref 134–144)
eGFR: 77 mL/min/{1.73_m2} (ref >59)

## 2021-07-26 ENCOUNTER — Telehealth: Payer: Self-pay

## 2021-07-26 DIAGNOSIS — I10 Essential (primary) hypertension: Secondary | ICD-10-CM

## 2021-07-26 NOTE — Telephone Encounter (Addendum)
Patient returned call to office. Advised patient of results. Patient advised to continue to hold spironolactone per J. Quentin Ore. Patient will repeat BMET in 1 month.----- Message from Warren Lacy, PA-C sent at 07/25/2021  9:39 AM EDT ----- ?Potassium now in normal range (though on higher end of normal) ?Continue to follow low potassium diet. ?Continue holding spironolactone - I.e. remove it from medication bag.   ?Recommend rechecking metabolic panel (BMET) in 1 month to ensure potassium stays in normal range. ?

## 2021-07-26 NOTE — Telephone Encounter (Addendum)
Called patient regarding results. Left message for patient to call office.----- Message from Warren Lacy, PA-C sent at 07/25/2021  9:39 AM EDT ----- ?Potassium now in normal range (though on higher end of normal) ?Continue to follow low potassium diet. ?Continue holding spironolactone - I.e. remove it from medication bag.   ?Recommend rechecking metabolic panel (BMET) in 1 month to ensure potassium stays in normal range. ?

## 2021-07-27 ENCOUNTER — Ambulatory Visit (INDEPENDENT_AMBULATORY_CARE_PROVIDER_SITE_OTHER): Payer: Medicaid Other

## 2021-07-27 DIAGNOSIS — I255 Ischemic cardiomyopathy: Secondary | ICD-10-CM

## 2021-07-27 LAB — CUP PACEART REMOTE DEVICE CHECK
Battery Voltage: 3.11 V
Brady Statistic RV Percent Paced: 0 %
Date Time Interrogation Session: 20230405092818
HighPow Impedance: 85 Ohm
Implantable Lead Implant Date: 20220706
Implantable Lead Location: 753860
Implantable Lead Model: 436909
Implantable Lead Serial Number: 81441117
Implantable Pulse Generator Implant Date: 20220706
Lead Channel Impedance Value: 697 Ohm
Lead Channel Pacing Threshold Amplitude: 0.5 V
Lead Channel Pacing Threshold Pulse Width: 0.4 ms
Lead Channel Sensing Intrinsic Amplitude: 19.3 mV
Lead Channel Sensing Intrinsic Amplitude: 3.9 mV
Lead Channel Setting Pacing Amplitude: 2 V
Lead Channel Setting Pacing Pulse Width: 0.4 ms
Lead Channel Setting Sensing Sensitivity: 0.8 mV
Pulse Gen Model: 429525
Pulse Gen Serial Number: 84841030

## 2021-07-31 ENCOUNTER — Other Ambulatory Visit: Payer: Self-pay

## 2021-07-31 MED ORDER — APIXABAN 5 MG PO TABS: 5.0000 mg | ORAL_TABLET | Freq: Two times a day (BID) | ORAL | 5 refills | Status: DC

## 2021-07-31 NOTE — Telephone Encounter (Signed)
Prescription refill request for Eliquis received. ?Indication:Afib ?Last office visit:3/23 ?Scr:1.1 ?Age: 61 ?Weight:102.1 kg ? ?Prescription refilled ? ?

## 2021-08-11 NOTE — Progress Notes (Signed)
Remote ICD transmission.   

## 2021-08-16 LAB — BASIC METABOLIC PANEL
BUN/Creatinine Ratio: 19 (ref 9–20)
BUN: 21 mg/dL (ref 6–24)
CO2: 25 mmol/L (ref 20–29)
Calcium: 8.3 mg/dL — ABNORMAL LOW (ref 8.7–10.2)
Chloride: 101 mmol/L (ref 96–106)
Creatinine, Ser: 1.11 mg/dL (ref 0.76–1.27)
Glucose: 207 mg/dL — ABNORMAL HIGH (ref 70–99)
Potassium: 5.2 mmol/L (ref 3.5–5.2)
Sodium: 142 mmol/L (ref 134–144)
eGFR: 76 mL/min/{1.73_m2} (ref >59)

## 2021-08-18 IMAGING — DX DG CHEST 2V
2 series · 2 of 2 positions shown · non-contrast
Comparison: 11/26/2019

CLINICAL DATA: Shortness of breath.

EXAM:
CHEST - 2 VIEW

[chest pa]
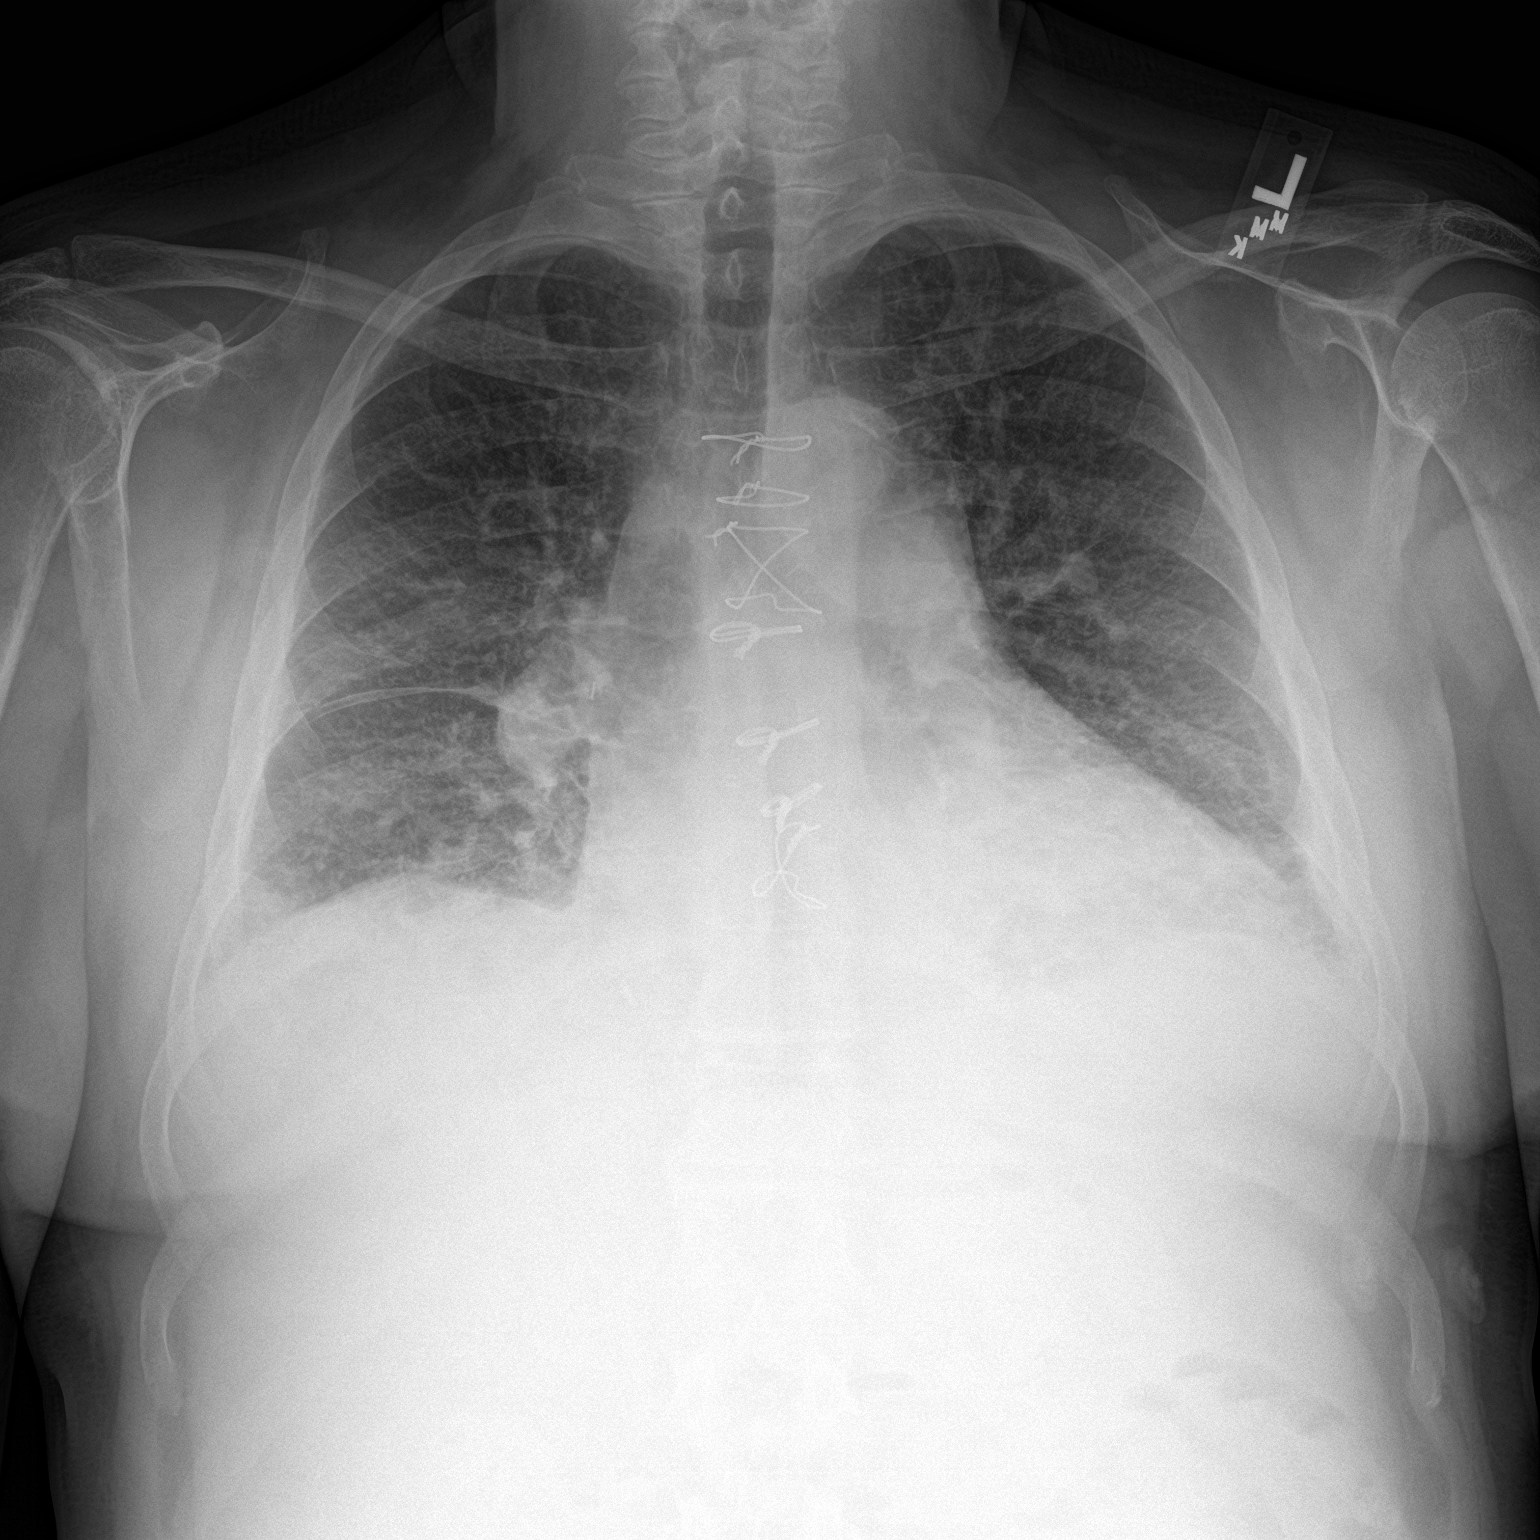

[chest lat]
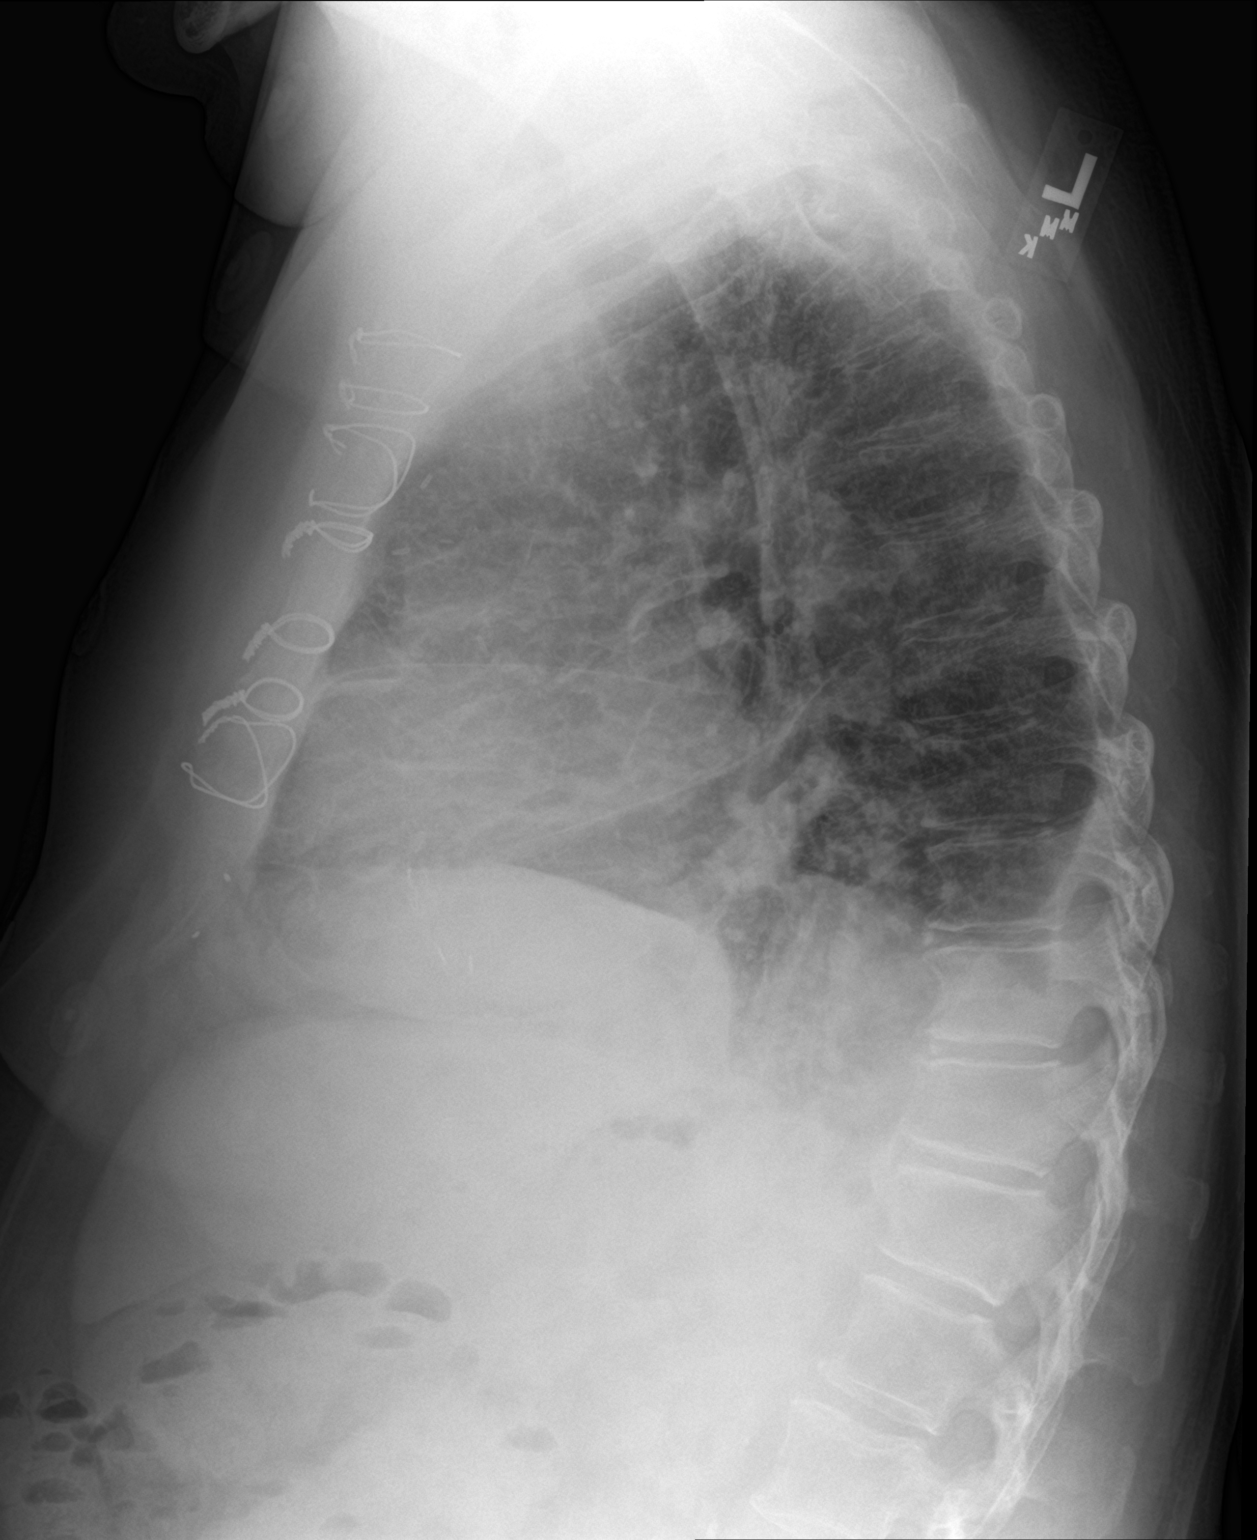

[2 of 2 positions shown; findings below may reference images not displayed]

FINDINGS: The patient is status post prior CABG. The main pulmonary arteries
are dilated as before. There are aortic calcifications. The heart
size is enlarged. There are findings of mild interstitial edema with
small bilateral pleural effusions. There is atelectasis at the lung
bases.
IMPRESSION: Cardiomegaly with mild interstitial edema and small bilateral
pleural effusions.

## 2021-08-18 IMAGING — CT CT ANGIO CHEST
2 of 7 series · 17 of 46 positions shown · IV contrast (omnipaque)
Comparison: Chest radiography earlier same day. Chest CT
11/26/2019.

CLINICAL DATA: Positive D-dimer.  Shortness of breath.

EXAM:
CT ANGIOGRAPHY CHEST WITH CONTRAST
TECHNIQUE: Multidetector CT imaging of the chest was performed using the
standard protocol during bolus administration of intravenous
contrast. Multiplanar CT image reconstructions and MIPs were
obtained to evaluate the vascular anatomy.
CONTRAST:  100mL OMNIPAQUE IOHEXOL 350 MG/ML SOLN

[Series 7: thins · axial · 0.81mm/px · z∈[-170,+146]mm · 14 of 510 slices shown]
[im 29/510  lung]
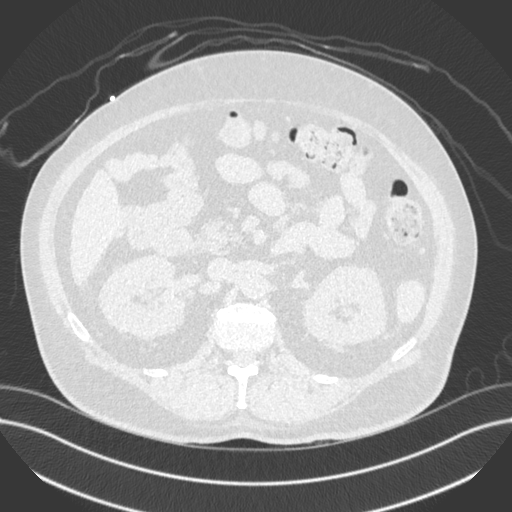
[im 57/510  soft-tissue]
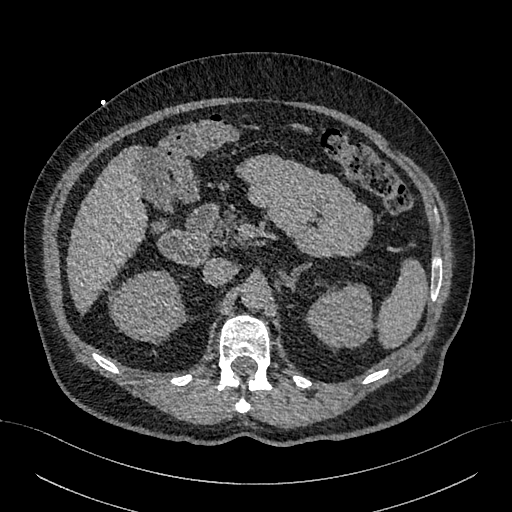
[im 114/510  lung]
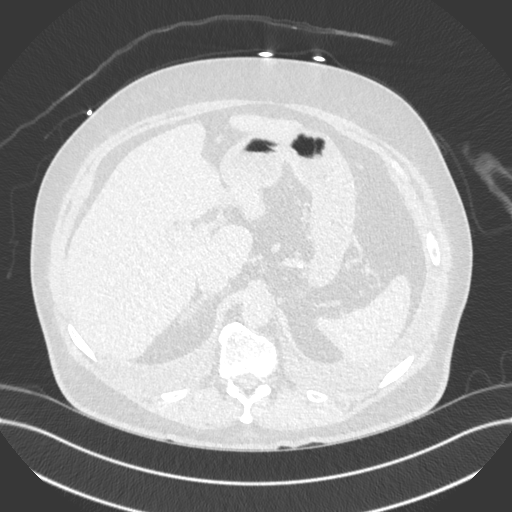
[im 142/510  soft-tissue]
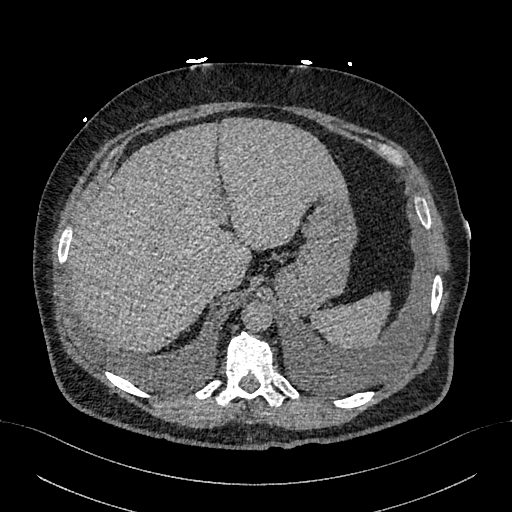
[im 170/510  lung]
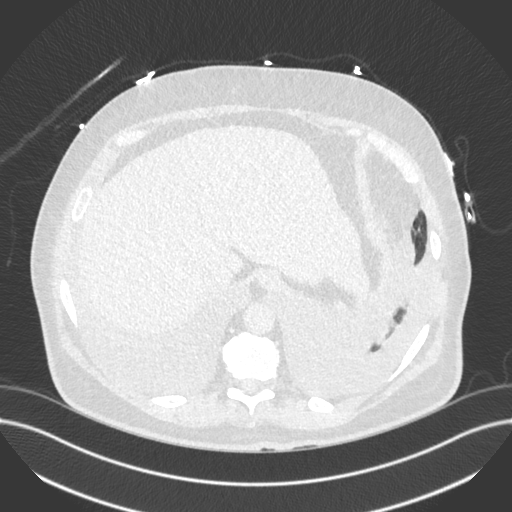
[im 198/510  soft-tissue]
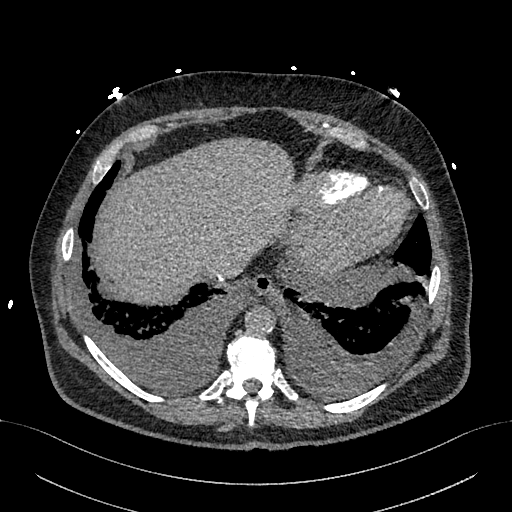
[im 227/510  lung]
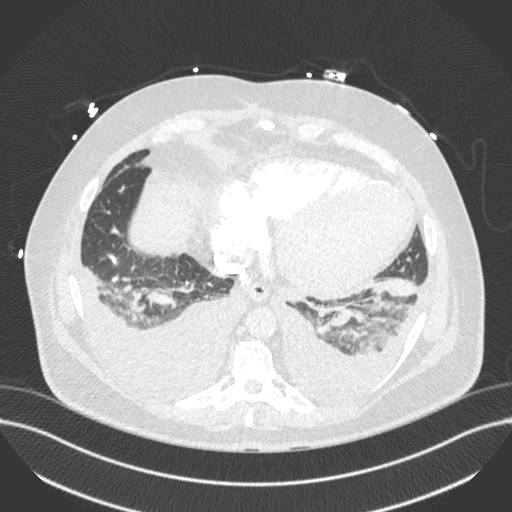
[im 283/510  soft-tissue]
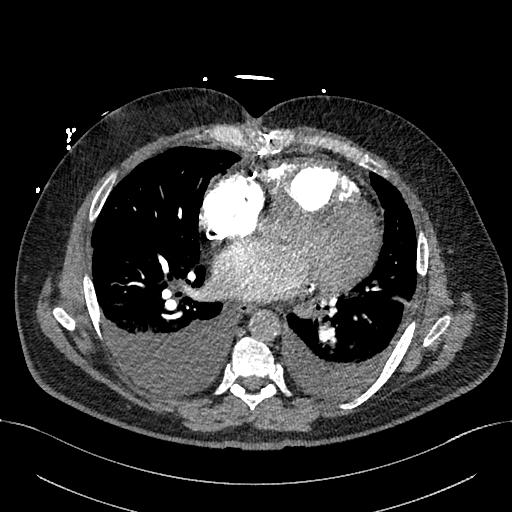
[im 312/510  lung]
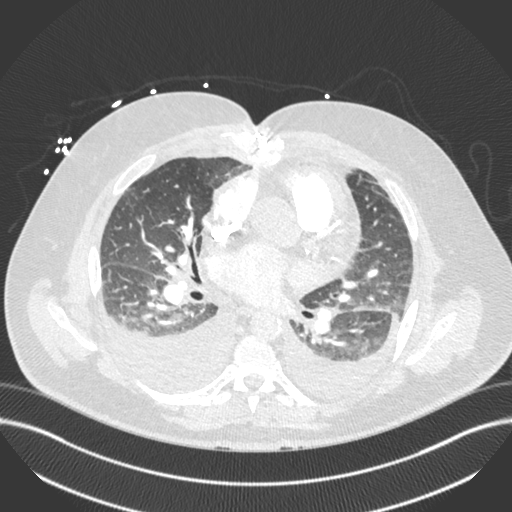
[im 340/510  soft-tissue]
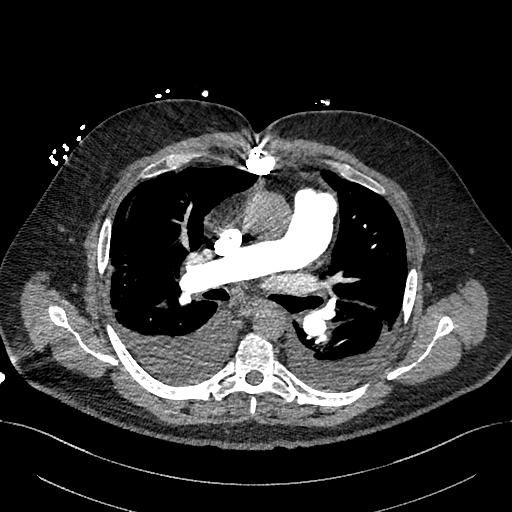
[im 368/510  lung]
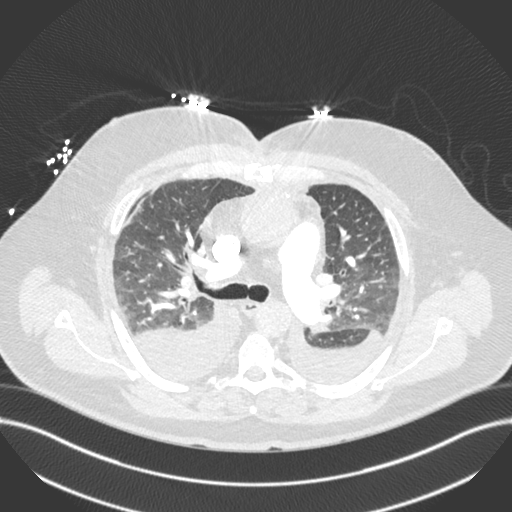
[im 396/510  soft-tissue]
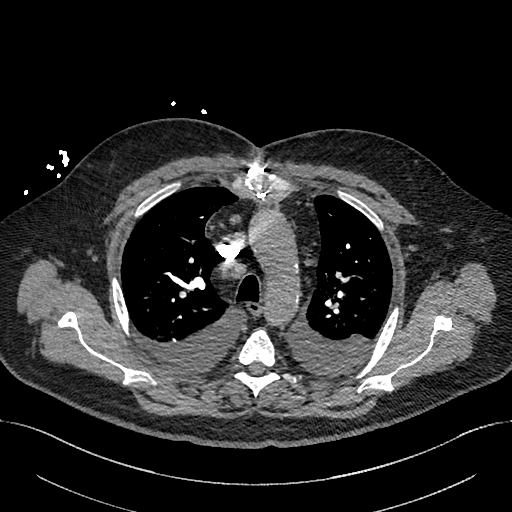
[im 453/510  lung]
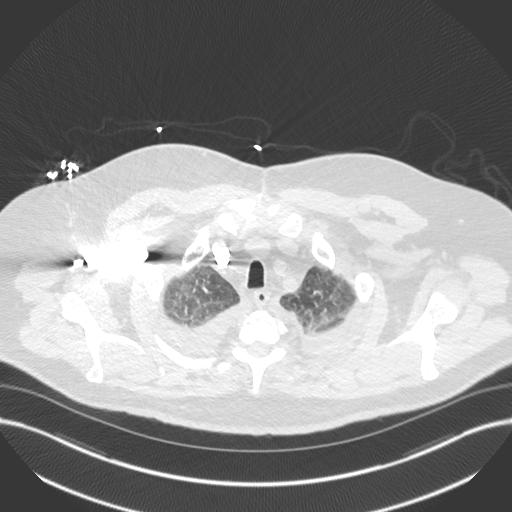
[im 481/510  soft-tissue]
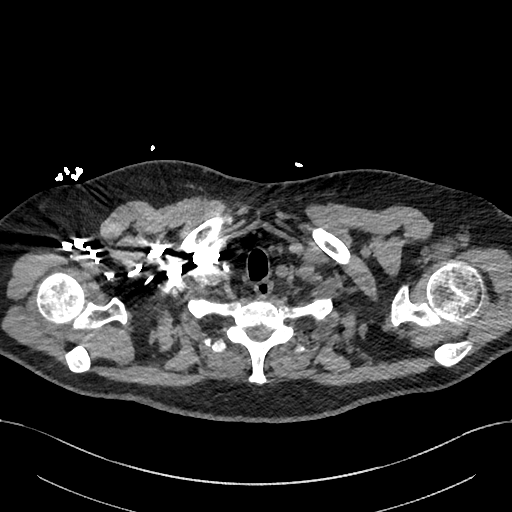

[Series 8: cor · coronal · 0.74mm/px · 3 of 163 slices shown]
[im 41/163  soft-tissue]
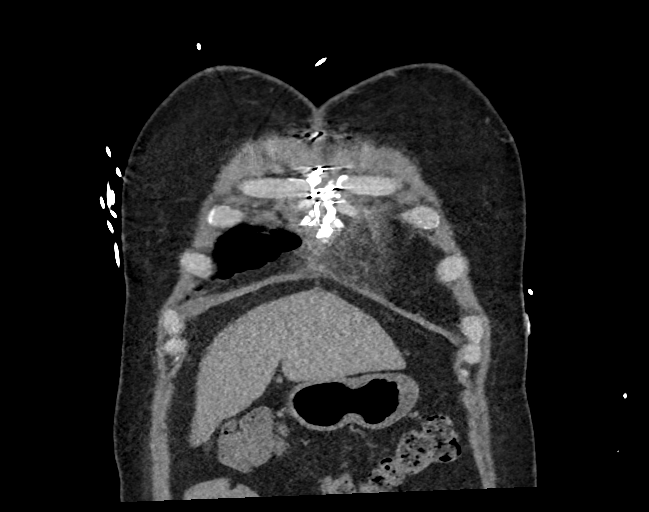
[im 82/163  soft-tissue]
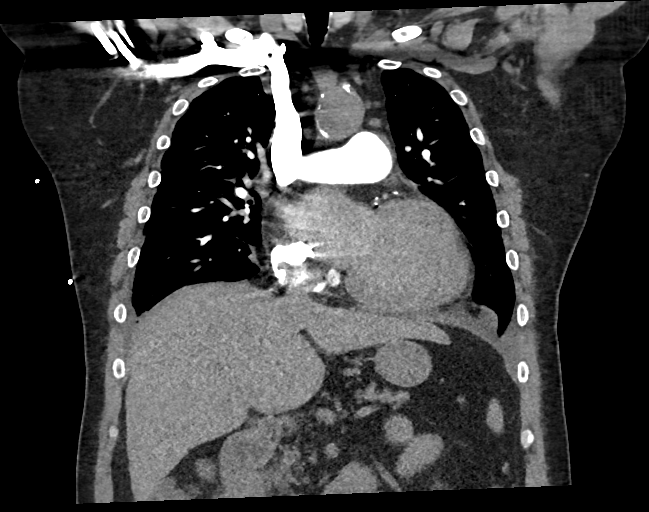
[im 122/163  soft-tissue]
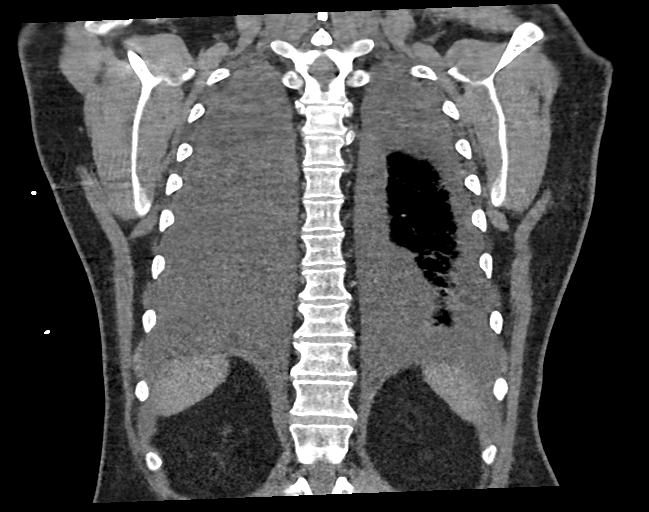

[17 of 46 positions shown; findings below may reference images not displayed]

FINDINGS: Cardiovascular: Previous median sternotomy and CABG. Cardiomegaly.
Some calcification of the anterior wall the left ventricle. Thoracic
aortic atherosclerotic calcification. Pulmonary arterial
opacification is centrally and in the upper lobes. There is
diminished opacification of the lower lobe pulmonary arteries that I
favor is due to bolus timing. Lower lobe pulmonary arterial branches
are not opacified, but this is bilateral and symmetric and there
does not appear to be a true filling defect, therefore I do not
think that this appearance is due to embolic disease based on any
positive finding. Of course there could be a small embolus in the
unopacified vessels. All opacified vessels are free of emboli.

Mediastinum/Nodes: No mass or adenopathy. Changes related to
previous CABG. Sternal nonunion.

Lungs/Pleura: Bilateral pleural effusions layering dependently.
Dependent pulmonary atelectasis, most pronounced in the lower lobes.
Mild interstitial edema pattern.

Upper Abdomen: No acute finding. No significant upper abdominal
pathology.

Musculoskeletal: Minimal thoracic degenerative changes. Sternal
nonunion as noted above.

Review of the MIP images confirms the above findings.
IMPRESSION: 1. Cardiomegaly. Previous median sternotomy and CABG. Some
calcification of the anterior wall of the left ventricle.
2. Bilateral pleural effusions layering dependently. Dependent
pulmonary atelectasis, most pronounced in the lower lobes. Mild
interstitial edema pattern. Findings are consistent with congestive
heart failure.
3. Lower lobe pulmonary arterial opacification is poor, but I favor
that this is due to bolus timing. I do not think this appearance is
due to embolic disease based on any positive finding. Of course
there could be a small embolus in the unopacified vessels of the
lower lobes. There is no positive sign of embolic disease in any
opacified vessel.
4. Aortic atherosclerosis.

Aortic Atherosclerosis ([id number]).

## 2021-08-22 ENCOUNTER — Telehealth: Payer: Self-pay

## 2021-08-22 NOTE — Telephone Encounter (Addendum)
Called patient regarding results. Patient had understanding of results.----- Message from Warren Lacy, PA-C sent at 08/20/2021  2:06 PM EDT ----- ?Potassium remains in normal range (though on higher end of normal) ?Continue to follow low potassium diet. ?No longer on spironolactone. ?

## 2021-08-22 NOTE — Telephone Encounter (Addendum)
Called patient regarding results. Patient had understanding of results ?----- Message from Warren Lacy, PA-C sent at 08/20/2021  2:06 PM EDT ----- ?Potassium remains in normal range (though on higher end of normal) ?Continue to follow low potassium diet. ?No longer on spironolactone. ?

## 2021-09-02 ENCOUNTER — Other Ambulatory Visit: Payer: Self-pay | Admitting: Cardiology

## 2021-09-02 MED ORDER — CARVEDILOL 25 MG PO TABS: 25.0000 mg | ORAL_TABLET | Freq: Two times a day (BID) | ORAL | 1 refills | Status: DC

## 2021-10-10 ENCOUNTER — Ambulatory Visit (INDEPENDENT_AMBULATORY_CARE_PROVIDER_SITE_OTHER)
Admission: RE | Admit: 2021-10-10 | Discharge: 2021-10-10 | Disposition: A | Discharge status: Home / Self Care | Payer: Medicaid Other | Source: Ambulatory Visit | Attending: Vascular Surgery | Admitting: Vascular Surgery

## 2021-10-10 ENCOUNTER — Ambulatory Visit (HOSPITAL_COMMUNITY)
Admission: RE | Admit: 2021-10-10 | Discharge: 2021-10-10 | Disposition: A | Discharge status: Home / Self Care | Payer: Medicaid Other | Source: Ambulatory Visit | Attending: Vascular Surgery | Admitting: Vascular Surgery

## 2021-10-10 ENCOUNTER — Ambulatory Visit (INDEPENDENT_AMBULATORY_CARE_PROVIDER_SITE_OTHER): Payer: Medicaid Other | Admitting: Physician Assistant

## 2021-10-10 VITALS — BP 112/68 | HR 55 | Temp 97.9°F | Resp 18 | Ht 67.0 in | Wt 225.0 lb

## 2021-10-10 DIAGNOSIS — I739 Peripheral vascular disease, unspecified: Secondary | ICD-10-CM

## 2021-10-10 DIAGNOSIS — I6523 Occlusion and stenosis of bilateral carotid arteries: Secondary | ICD-10-CM

## 2021-10-10 NOTE — Progress Notes (Signed)
Established Office Visit   History of Present Illness   [name] is a 61 y.o. (1960/05/25) male who presents for surveillance of bilateral intermittent claudication and bilateral carotid artery stenosis.  The patient has had staged left SFA stent and right SFA stent balloon angioplasties by Dr. Trula Slade on 01/17/2021 and 02/07/2021, respectively. The patient has also had an ultrasound guided thrombin injection of right common femoral pseudoaneurysm on 02/03/2021 by Dr.Hawken. He has had a staged right TCAR on 12/01/2020 and left TCAR on 12/30/2020.   Today the patient states that everything is doing well, and he has no concerns. He is able to walk short distances without pain in his legs. He has not attempted to walk longer distances yet since the passing of his mother. He denies any claudication, rest pain, or non healing wounds of the extremities. He also denies any stroke symptoms such as slurred speech, vision changes, or sudden weakness/numbness of one side of the body. He still no longer smokes.  He reports occasional foot pain, however he states it is due to his diabetic neuropathy. He does not have to move his legs or dangle his feet off the bed to get the pain to go away.  He says he is set to see a new PCP tomorrow on 10/11/2021. Current Outpatient Medications  Medication Sig Dispense Refill   ACCU-CHEK GUIDE test strip      amLODipine (NORVASC) 5 MG tablet TAKE 1 TABLET BY MOUTH DAILY 90 tablet 1   apixaban (ELIQUIS) 5 MG TABS tablet Take 1 tablet (5 mg total) by mouth 2 (two) times daily. 60 tablet 5   atorvastatin (LIPITOR) 40 MG tablet Take 40 mg by mouth daily.     clopidogrel (PLAVIX) 75 MG tablet Take 1 tablet (75 mg total) by mouth daily. 30 tablet 11   Dulaglutide 1.5 MG/0.5ML SOPN Inject 1.5 mg into the skin once a week.     empagliflozin (JARDIANCE) 25 MG TABS tablet Take 1 tablet (25 mg total) by mouth daily before breakfast. 30 tablet    furosemide (LASIX) 40 MG  tablet Take 1 tablet (40 mg total) by mouth daily. 90 tablet 3   metFORMIN (GLUCOPHAGE) 1000 MG tablet Take 1,000 mg by mouth 2 (two) times daily.     oxyCODONE-acetaminophen (PERCOCET) 5-325 MG tablet Take 1 tablet by mouth every 4 (four) hours as needed for severe pain. 20 tablet 0   OXYGEN Inhale 3 L/min into the lungs at bedtime as needed (for shortness of breath).     sacubitril-valsartan (ENTRESTO) 97-103 MG Take 1 tablet by mouth 2 (two) times daily. 180 tablet 3   spironolactone (ALDACTONE) 25 MG tablet TAKE 1 TABLET BY MOUTH DAILY 90 tablet 2   TRESIBA FLEXTOUCH 100 UNIT/ML FlexTouch Pen Inject 30 Units into the skin daily.     carvedilol (COREG) 25 MG tablet Take 1 tablet (25 mg total) by mouth 2 (two) times daily with a meal. (Patient not taking: Reported on 10/10/2021) 180 tablet 1   ezetimibe (ZETIA) 10 MG tablet Take 1 tablet (10 mg total) by mouth daily. 30 tablet 2   Current Facility-Administered Medications  Medication Dose Route Frequency Provider Last Rate Last Admin   ezetimibe (ZETIA) tablet 10 mg  10 mg Oral Daily Setzer, Edman Circle, PA-C        REVIEW OF SYSTEMS (negative unless checked):   Cardiac:  '[]'$  Chest pain or chest pressure? '[]'$  Shortness of breath upon activity? '[]'$  Shortness of breath  when lying flat? '[]'$  Irregular heart rhythm?  Vascular:  '[]'$  Pain in calf, thigh, or hip brought on by walking? '[]'$  Pain in feet at night that wakes you up from your sleep? '[]'$  Blood clot in your veins? '[]'$  Leg swelling?  Pulmonary:  '[]'$  Oxygen at home? '[]'$  Productive cough? '[]'$  Wheezing?  Neurologic:  '[]'$  Sudden weakness in arms or legs? '[]'$  Sudden numbness in arms or legs? '[]'$  Sudden onset of difficult speaking or slurred speech? '[]'$  Temporary loss of vision in one eye? '[]'$  Problems with dizziness?  Gastrointestinal:  '[]'$  Blood in stool? '[]'$  Vomited blood?  Genitourinary:  '[]'$  Burning when urinating? '[]'$  Blood in urine?  Psychiatric:  '[]'$  Major depression  Hematologic:   '[]'$  Bleeding problems? '[]'$  Problems with blood clotting?  Dermatologic:  '[]'$  Rashes or ulcers?  Constitutional:  '[]'$  Fever or chills?  Ear/Nose/Throat:  '[]'$  Change in hearing? '[]'$  Nose bleeds? '[]'$  Sore throat?  Musculoskeletal:  '[]'$  Back pain? '[]'$  Joint pain? '[]'$  Muscle pain?   Physical Examination   Vitals:   10/10/21 1238  BP: 112/68  Pulse: (!) 55  Resp: 18  Temp: 97.9 F (36.6 C)  TempSrc: Temporal  SpO2: 96%  Weight: 225 lb (102.1 kg)  Height: '5\' 7"'$  (1.702 m)   Body mass index is 35.24 kg/m.  General:  WDWN in NAD; vital signs documented above Gait: Not observed HENT: WNL, normocephalic Pulmonary: normal non-labored breathing , without Rales, rhonchi,  wheezing Cardiac: regular HR, without murmurs, without carotid bruit Abdomen: soft, NT, no masses Skin: without rashes Vascular Exam/Pulses: Bilateral feet warm and well perfused. No palpable pulses on feet. Palpable 2+ bilateral radial pulses. Extremities: without ischemic changes, without Gangrene , without cellulitis; without open wounds Musculoskeletal: no muscle wasting or atrophy  Neurologic: A&O X 3;  No focal weakness or paresthesias are detected Psychiatric:  The pt has Normal affect.  Non-Invasive Vascular imaging   ABI (10/10/21)  +---------+------------------+-----+----------+--------+  Right    Rt Pressure (mmHg)IndexWaveform  Comment   +---------+------------------+-----+----------+--------+  Brachial 143                                        +---------+------------------+-----+----------+--------+  PTA      135               0.90 biphasic            +---------+------------------+-----+----------+--------+  DP       138               0.92 monophasic          +---------+------------------+-----+----------+--------+  Great Toe105               0.70 Normal              +---------+------------------+-----+----------+--------+    +---------+------------------+-----+--------+-------+  Left     Lt Pressure (mmHg)IndexWaveformComment  +---------+------------------+-----+--------+-------+  Brachial 150                                     +---------+------------------+-----+--------+-------+  PTA      154               1.03 biphasic         +---------+------------------+-----+--------+-------+  DP       147  0.98 biphasic         +---------+------------------+-----+--------+-------+  Great Toe103               0.69 Abnormal         +---------+------------------+-----+--------+-------+   +-------+-----------+-----------+------------+------------+  ABI/TBIToday's ABIToday's TBIPrevious ABIPrevious TBI  +-------+-----------+-----------+------------+------------+  Right  0.92       0.70       0.95        absent        +-------+-----------+-----------+------------+------------+  Left   1.03       0.69       0.92        absent        +-------+-----------+-----------+------------+------------+  Bilateral Lower Extremity Duplex  +----------+--------+-----+--------+--------+--------+  RIGHT     PSV cm/sRatioStenosisWaveformComments  +----------+--------+-----+--------+--------+--------+  CFA Prox  177                  biphasic          +----------+--------+-----+--------+--------+--------+  DFA       42                   biphasic          +----------+--------+-----+--------+--------+--------+  SFA Prox  92                   biphasic          +----------+--------+-----+--------+--------+--------+  POP Distal47                   biphasicbroad     +----------+--------+-----+--------+--------+--------+        Right Stent(s):  +---------------+---+---------------+--------+-----------------------------  ---+  Prox to Stent  92                biphasic                                    +---------------+---+---------------+--------+-----------------------------  ---+  Proximal Stent 103               biphasic                                   +---------------+---+---------------+--------+-----------------------------  ---+  Mid Stent      135               biphasic                                   +---------------+---+---------------+--------+-----------------------------  ---+  Distal Stent   29050-99% stenosisbiphasicpopliteal area, flow  disturbance  +---------------+---+---------------+--------+-----------------------------  ---+  Distal to [id number]               biphasicdampened                           +---------------+---+---------------+--------+-----------------------------  ---+      +----------+--------+-----+--------+----------+--------+  LEFT      PSV cm/sRatioStenosisWaveform  Comments  +----------+--------+-----+--------+----------+--------+  CFA Prox  122                  biphasic            +----------+--------+-----+--------+----------+--------+  DFA       32  biphasic  broad     +----------+--------+-----+--------+----------+--------+  SFA Prox  109                                      +----------+--------+-----+--------+----------+--------+  POP Distal91                   monophasicbrisk     +----------+--------+-----+--------+----------+--------+      Left Stent(s):  +---------------+---+---------------+----------+-----+  Prox to Stent  82                biphasic         +---------------+---+---------------+----------+-----+  Proximal Stent 103               biphasic  NWV    +---------------+---+---------------+----------+-----+  Mid Stent      82                biphasic         +---------------+---+---------------+----------+-----+  Distal Stent   22450-99% stenosisbiphasic          +---------------+---+---------------+----------+-----+  Distal to Stent101               monophasicbrisk  +---------------+---+---------------+----------+-----+    Bilateral Carotid Duplex  Right Carotid Findings:  +----------+--------+--------+--------+------------------+--------+            PSV cm/sEDV cm/sStenosisPlaque DescriptionComments  +----------+--------+--------+--------+------------------+--------+  CCA Prox  83      11                                          +----------+--------+--------+--------+------------------+--------+  CCA Mid   35      7                                           +----------+--------+--------+--------+------------------+--------+  CCA Distal45      10                                stent     +----------+--------+--------+--------+------------------+--------+  ICA Prox                                            stent     +----------+--------+--------+--------+------------------+--------+  ICA Mid                                             stent     +----------+--------+--------+--------+------------------+--------+  ICA Distal43      11                                          +----------+--------+--------+--------+------------------+--------+  ECA       421             >50%                                +----------+--------+--------+--------+------------------+--------+   +----------+--------+-------+----------------+-------------------+  PSV cm/sEDV cmsDescribe        Arm Pressure (mmHG)  +----------+--------+-------+----------------+-------------------+  [id number]     3      Multiphasic, WNL                     +----------+--------+-------+----------------+-------------------+   +---------+--------+--+--------+-+---------+  VertebralPSV cm/s36EDV cm/s5Antegrade  +---------+--------+--+--------+-+---------+       Right Stent(s):   +---------------+---+--++++  Prox to Stent  68 15  +---------------+---+--++++  Proximal Stent 11425  +---------------+---+--++++  Mid Stent      99 19  +---------------+---+--++++  Distal Stent   11117  +---------------+---+--++++  Distal to Stent41 10  +---------------+---+--++++       Left Carotid Findings:  +----------+--------+--------+--------+------------------+--------+            PSV cm/sEDV cm/sStenosisPlaque DescriptionComments  +----------+--------+--------+--------+------------------+--------+  CCA Prox  75      10                                          +----------+--------+--------+--------+------------------+--------+  CCA Mid   49      11                                          +----------+--------+--------+--------+------------------+--------+  CCA Distal                                          stent     +----------+--------+--------+--------+------------------+--------+  ICA Prox                                            stent     +----------+--------+--------+--------+------------------+--------+  ICA Mid                                             stent     +----------+--------+--------+--------+------------------+--------+  ICA Distal66      19                                          +----------+--------+--------+--------+------------------+--------+  ECA       298     15      >50%                                +----------+--------+--------+--------+------------------+--------+   +----------+--------+--------+----------------+-------------------+            PSV cm/sEDV cm/sDescribe        Arm Pressure (mmHG)  +----------+--------+--------+----------------+-------------------+  [id number]     3       Multiphasic, WNL                     +----------+--------+--------+----------------+-------------------+    +---------+--------+--+--------+--+---------+  VertebralPSV cm/s47EDV cm/s10Antegrade  +---------+--------+--+--------+--+---------+       Left Stent(s):  +---------------+---+--++++  Prox to Stent  51 13  +---------------+---+--++++  Proximal Stent 11530  +---------------+---+--++++  Mid Stent      71 20  +---------------+---+--++++  Distal Stent   63 14  +---------------+---+--++++  Distal to Stent66 19  +---------------+---+--++++  Medical Decision Making   [name] is a 61 y.o. male who presents with: bilateral leg intermittent claudication without evidence of critical limb ischemia. He is also here for surveillance of bilateral carotid artery stenosis.  Bilateral feet are warm and well perfused with patent SFA stents. R ABI is 0.92 and L is 1.03, improved from last visit. Distal R SFA stent with 50-99% stenosis, however right toe pressure is great. Patient is pain free, so there is little concern for lack of flow to R lower extremity even with some instent stenosis. Will monitor. Bilateral carotid stents are patent with good blood flow. No stroke symptoms or carotid bruits. I discussed in depth with the patient the nature of atherosclerosis, and emphasized the importance of maximal medical management including strict control of blood pressure, blood glucose, and lipid levels, antiplatelet agents, obtaining regular exercise, and no use of tobacco products. The patient is currently on a statin: Lipitor.  The patient is currently on an anti-platelet: Plavix. The patient will follow up in 6 months for bilateral ABIs and LE duplex, and in 1 year for bilateral carotid duplex studies.   Vicente Serene PA-C [facility] Office: [phone]  Clinic MD: Hawken/Clark

## 2021-10-13 ENCOUNTER — Other Ambulatory Visit: Payer: Self-pay

## 2021-10-13 DIAGNOSIS — I70213 Atherosclerosis of native arteries of extremities with intermittent claudication, bilateral legs: Secondary | ICD-10-CM

## 2021-10-13 DIAGNOSIS — I739 Peripheral vascular disease, unspecified: Secondary | ICD-10-CM

## 2021-10-26 ENCOUNTER — Ambulatory Visit (INDEPENDENT_AMBULATORY_CARE_PROVIDER_SITE_OTHER): Payer: Medicaid Other

## 2021-10-26 DIAGNOSIS — I255 Ischemic cardiomyopathy: Secondary | ICD-10-CM

## 2021-10-26 LAB — CUP PACEART REMOTE DEVICE CHECK
Battery Remaining Percentage: 100 %
Battery Voltage: 3.09 V
Brady Statistic AS VP Percent: 0 %
Brady Statistic AS VS Percent: 99 %
Brady Statistic RV Percent Paced: 0 %
Date Time Interrogation Session: 20230706004600
HighPow Impedance: 85 Ohm
HighPow Impedance: 87 Ohm
Implantable Lead Implant Date: 20220706
Implantable Lead Location: 753860
Implantable Lead Model: 436909
Implantable Lead Serial Number: 81441117
Implantable Pulse Generator Implant Date: 20220706
Lead Channel Impedance Value: 677 Ohm
Lead Channel Impedance Value: 703 Ohm
Lead Channel Pacing Threshold Amplitude: 0.5 V
Lead Channel Pacing Threshold Amplitude: 0.6 V
Lead Channel Pacing Threshold Pulse Width: 0.4 ms
Lead Channel Pacing Threshold Pulse Width: 0.4 ms
Lead Channel Sensing Intrinsic Amplitude: 20 mV
Lead Channel Sensing Intrinsic Amplitude: 3.2 mV
Lead Channel Setting Pacing Amplitude: 2 V
Lead Channel Setting Pacing Pulse Width: 0.4 ms
Lead Channel Setting Sensing Sensitivity: 0.8 mV
Pulse Gen Model: 429525
Pulse Gen Serial Number: 84841030

## 2021-11-13 NOTE — Progress Notes (Signed)
Remote ICD transmission.   

## 2022-01-08 NOTE — Progress Notes (Signed)
HPI: Follow-up coronary artery disease and congestive heart failure.  Patient admitted with congestive heart failure July 2021.  Cardiac catheterization showed severe three-vessel coronary artery disease. He had coronary artery bypass graft with a LIMA to the LAD and RIMA to the PDA July 2021.  Postoperative course complicated by atrial fibrillation. Most recent echocardiogram 2/22 showed ejection fraction 25 to 30%, mild left ventricular hypertrophy, restrictive filling, moderate RV dysfunction, severe left atrial enlargement, mild tricuspid regurgitation. Patient had ICD implanted June 2022. Carotid Dopplers June 2023 showed patent stent in the right carotid and left carotid.  Lower extremity Dopplers June 2023 showed 50 to 99% distal right popliteal stent and 50 to 99% distal popliteal stent on the left.  ABIs June 2023 mild on the right and abnormal toe brachial index on the left.  Since last seen he has dyspnea with more moderate activities but not routine things in the house.  No orthopnea, PND, pedal edema, chest pain or syncope.  Current Outpatient Medications  Medication Sig Dispense Refill   ACCU-CHEK GUIDE test strip      amLODipine (NORVASC) 5 MG tablet TAKE 1 TABLET BY MOUTH DAILY 90 tablet 1   apixaban (ELIQUIS) 5 MG TABS tablet Take 1 tablet (5 mg total) by mouth 2 (two) times daily. 60 tablet 5   atorvastatin (LIPITOR) 40 MG tablet Take 40 mg by mouth daily.     carvedilol (COREG) 25 MG tablet Take 1 tablet (25 mg total) by mouth 2 (two) times daily with a meal. 180 tablet 1   clopidogrel (PLAVIX) 75 MG tablet Take 1 tablet (75 mg total) by mouth daily. 30 tablet 11   Dulaglutide 1.5 MG/0.5ML SOPN Inject 1.5 mg into the skin once a week.     empagliflozin (JARDIANCE) 25 MG TABS tablet Take 1 tablet (25 mg total) by mouth daily before breakfast. 30 tablet    ezetimibe (ZETIA) 10 MG tablet Take 1 tablet (10 mg total) by mouth daily. 30 tablet 2   furosemide (LASIX) 40 MG tablet  Take 1 tablet (40 mg total) by mouth daily. 90 tablet 3   metFORMIN (GLUCOPHAGE) 1000 MG tablet Take 1,000 mg by mouth 2 (two) times daily.     OXYGEN Inhale 3 L/min into the lungs at bedtime as needed (for shortness of breath).     sacubitril-valsartan (ENTRESTO) 97-103 MG Take 1 tablet by mouth 2 (two) times daily. 180 tablet 3   TRESIBA FLEXTOUCH 100 UNIT/ML FlexTouch Pen Inject 30 Units into the skin daily.     Current Facility-Administered Medications  Medication Dose Route Frequency Provider Last Rate Last Admin   ezetimibe (ZETIA) tablet 10 mg  10 mg Oral Daily Setzer, Edman Circle, PA-C         Past Medical History:  Diagnosis Date   AICD (automatic cardioverter/defibrillator) present    Anginal pain ([place])    Anxiety    CAD (coronary artery disease)    CHF (congestive heart failure) (HCC)    Chronic back pain    Colon polyps    Diabetes mellitus    Heart attack (H[facility])    Hyperlipidemia    Hypertension    Neuropathy    Panic attacks    PVD (peripheral vascular disease) ([place])    Sleep apnea     Past Surgical History:  Procedure Laterality Date   ABDOMINAL AORTOGRAM W/LOWER EXTREMITY N/A 01/17/2021   Procedure: ABDOMINAL AORTOGRAM W/LOWER EXTREMITY;  Surgeon: Serafina Mitchell, MD;  Location: [facility]  [facility];  Service: Cardiovascular;  Laterality: N/A;   ABDOMINAL AORTOGRAM W/LOWER EXTREMITY N/A 02/07/2021   Procedure: ABDOMINAL AORTOGRAM W/LOWER EXTREMITY;  Surgeon: Serafina Mitchell, MD;  Location: [place] [facility];  Service: Cardiovascular;  Laterality: N/A;   APPLICATION OF A-CELL OF BACK N/A 12/09/2019   Procedure: APPLICATION OF A-CELL OF BACK;  Surgeon: Wonda Olds, MD;  [facility];  Service: Thoracic;  Laterality: N/A;   APPLICATION OF A-CELL OF CHEST/ABDOMEN N/A 12/04/2019   Procedure: APPLICATION OF A-CELL OF CHEST;  Surgeon: Wonda Olds, MD;  [facility];  Service: Thoracic;  Laterality: N/A;   APPLICATION OF WOUND VAC N/A 11/27/2019    Procedure: APPLICATION OF WOUND VAC;  Surgeon: Ivin Poot, MD;  Location: [place];  Service: Cardiovascular;  Laterality: N/A;  Lower midline superficial sternal wound area.   APPLICATION OF WOUND VAC N/A 12/09/2019   Procedure: WOUND VAC EXCHANGE;  Surgeon: Wonda Olds, MD;  [facility];  Service: Thoracic;  Laterality: N/A;   APPLICATION OF WOUND VAC N/A 12/14/2019   Procedure: WOUND VAC CHANGE;  Surgeon: Wonda Olds, MD;  [facility];  Service: Thoracic;  Laterality: N/A;   CORONARY ANGIOPLASTY WITH STENT PLACEMENT     CORONARY ARTERY BYPASS GRAFT N/A 10/29/2019   Procedure: CORONARY ARTERY BYPASS GRAFTING (CABG) using LIMA to LAD; RIMA to PDA; and Left radial harvest vein to Circ.;  Surgeon: Wonda Olds, MD;  Location: [place];  Service: Open Heart Surgery;  Laterality: N/A;   EAR CYST EXCISION  12/10/2011   Procedure: CYST REMOVAL;  Surgeon: Gae Bon, DDS;  Location: [place];  Service: Oral Surgery;  Laterality: Left;  Left Mandible Cyst Removal   ICD IMPLANT N/A 10/26/2020   Procedure: ICD IMPLANT;  Surgeon: Evans Lance, MD;  Location: [place] [facility];  Service: Cardiovascular;  Laterality: N/A;   IR THORACENTESIS ASP PLEURAL SPACE W/IMG GUIDE  10/27/2019   MULTIPLE EXTRACTIONS WITH ALVEOLOPLASTY  12/10/2011   Procedure: MULTIPLE EXTRACION WITH ALVEOLOPLASTY;  Surgeon: Gae Bon, DDS;  Location: [place];  Service: Oral Surgery;  Laterality: N/A;  Right Maxillary Tuberosity Reduction; Right  Maxillary Buccal Exostosis; Bilateral Mandibular Tori    PERIPHERAL VASCULAR BALLOON ANGIOPLASTY Right 02/07/2021   Procedure: PERIPHERAL VASCULAR BALLOON ANGIOPLASTY;  Surgeon: Serafina Mitchell, MD;  Location: [place] [facility];  Service: Cardiovascular;  Laterality: Right;  Superficial femoral artery   PERIPHERAL VASCULAR CATHETERIZATION N/A 12/28/2014   Procedure: Abdominal Aortogram;  Surgeon: Serafina Mitchell, MD;  Location: [place] [facility];  Service: Cardiovascular;   Laterality: N/A;   PERIPHERAL VASCULAR CATHETERIZATION N/A 12/20/2015   Procedure: Abdominal Aortogram;  Surgeon: Serafina Mitchell, MD;  Location: [place] [facility];  Service: Cardiovascular;  Laterality: N/A;   PERIPHERAL VASCULAR CATHETERIZATION  12/20/2015   Procedure: Peripheral Vascular Balloon Angioplasty;  Surgeon: Serafina Mitchell, MD;  Location: [facility];  Service: Cardiovascular;;   PERIPHERAL VASCULAR INTERVENTION Left 01/17/2021   Procedure: PERIPHERAL VASCULAR INTERVENTION;  Surgeon: Serafina Mitchell, MD;  Location: [place] [facility];  Service: Cardiovascular;  Laterality: Left;  Left popiteal and left SFA   RADIAL ARTERY HARVEST Left 10/29/2019   Procedure: RADIAL ARTERY HARVEST;  Surgeon: Wonda Olds, MD;  Location: [place];  Service: Open Heart Surgery;  Laterality: Left;   RIGHT/LEFT HEART CATH AND CORONARY ANGIOGRAPHY N/A 10/28/2019   Procedure: RIGHT/LEFT HEART CATH AND CORONARY ANGIOGRAPHY;  Surgeon: Angelena Form,  Annita Brod, MD;  Location: [facility];  Service: Cardiovascular;  Laterality: N/A;   STERNAL WOUND DEBRIDEMENT N/A 12/01/2019   Procedure: STERNAL WOUND DEBRIDEMENT and WOUND VAC CHANGE;  Surgeon: Wonda Olds, MD;  Location: [place];  Service: Thoracic;  Laterality: N/A;   STERNAL WOUND DEBRIDEMENT N/A 12/04/2019   Procedure: STERNAL WOUND DEBRIDEMENT and WOUND VAC CHANGE;  Surgeon: Wonda Olds, MD;  Location: [place];  Service: Thoracic;  Laterality: N/A;   STERNAL WOUND DEBRIDEMENT N/A 12/09/2019   Procedure: STERNAL WOUND DEBRIDEMENT;  Surgeon: Wonda Olds, MD;  [facility];  Service: Thoracic;  Laterality: N/A;   TEE WITHOUT CARDIOVERSION N/A 10/29/2019   Procedure: TRANSESOPHAGEAL ECHOCARDIOGRAM (TEE);  Surgeon: Wonda Olds, MD;  Location: [place];  Service: Open Heart Surgery;  Laterality: N/A;   TRANSCAROTID ARTERY REVASCULARIZATION  Right 12/01/2020   Procedure: RIGHT TRANSCAROTID ARTERY REVASCULARIZATION;  Surgeon: Serafina Mitchell, MD;  [facility];  Service: Vascular;  Laterality: Right;   TRANSCAROTID ARTERY REVASCULARIZATION  Left 12/30/2020   Procedure: LEFT TRANSCAROTID ARTERY REVASCULARIZATION;  Surgeon: Serafina Mitchell, MD;  [facility];  Service: Vascular;  Laterality: Left;   ULTRASOUND GUIDANCE FOR VASCULAR ACCESS Left 12/01/2020   Procedure: ULTRASOUND GUIDANCE FOR VASCULAR ACCESS;  Surgeon: Serafina Mitchell, MD;  [facility];  Service: Vascular;  Laterality: Left;   ULTRASOUND GUIDANCE FOR VASCULAR ACCESS Right 12/30/2020   Procedure: ULTRASOUND GUIDANCE FOR VASCULAR ACCESS, RIGHT FEMORAL VEIN;  Surgeon: Serafina Mitchell, MD;  [facility];  Service: Vascular;  Laterality: Right;   WOUND EXPLORATION N/A 11/27/2019   Procedure: EXCISIONAL DEBRIDEMENT OF SUPERFICIAL STERNAL WOUND EXPLORATION;  Surgeon: Ivin Poot, MD;  Location: [place];  Service: Cardiovascular;  Laterality: N/A;  Superficial sternal wound    Social History   Socioeconomic History   Marital status: Legally Separated    Spouse name: Not on file   Number of children: 3   Years of education: Not on file   Highest education level: Not on file  Occupational History    Comment: Unemployed  Tobacco Use   Smoking status: Former    Packs/day: 0.25    Years: 25.00    Total pack years: 6.25    Types: Cigarettes    Start date: 75    Quit date: 10/23/2019    Years since quitting: 2.2   Smokeless tobacco: Never   Tobacco comments:    1/4 pack per day  Vaping Use   Vaping Use: Never used  Substance and Sexual Activity   Alcohol use: No    Alcohol/week: 0.0 standard drinks of alcohol   Drug use: No   Sexual activity: Not on file  Other Topics Concern   Not on file  Social History Narrative   Not on file   Social Determinants of Health   Financial Resource Strain: Low Risk  (06/06/2020)   Overall Financial Resource Strain (CARDIA)    Difficulty of Paying Living Expenses: Not very hard  Food Insecurity: No Food  Insecurity (06/06/2020)   Hunger Vital Sign    Worried About Running Out of Food in the Last Year: Never true    Ran Out of Food in the Last Year: Never true  Transportation Needs: No Transportation Needs (06/06/2020)   PRAPARE - Hydrologist (Medical): No    Lack of Transportation (Non-Medical): No  Physical Activity: Insufficiently Active (10/29/2019)   Exercise Vital Sign  Days of Exercise per Week: 1 day    Minutes of Exercise per Session: 30 min  Stress: Not on file  Social Connections: Socially Isolated (10/29/2019)   Social Connection and Isolation Panel [NHANES]    Frequency of Communication with Friends and Family: More than three times a week    Frequency of Social Gatherings with Friends and Family: More than three times a week    Attends Religious Services: Never    Marine scientist or Organizations: No    Attends Music therapist: Never    Marital Status: Divorced  Human resources officer Violence: Not on file    Family History  Problem Relation Age of Onset   Diabetes Father    Kidney failure Father    Hypertension Father    Hyperlipidemia Father    Heart disease Father     ROS: no fevers or chills, productive cough, hemoptysis, dysphasia, odynophagia, melena, hematochezia, dysuria, hematuria, rash, seizure activity, orthopnea, PND, pedal edema, claudication. Remaining systems are negative.  Physical Exam: Well-developed well-nourished in no acute distress.  Skin is warm and dry.  HEENT is normal.  Neck is supple.  Chest is clear to auscultation with normal expansion.  Cardiovascular exam is regular rate and rhythm.  Abdominal exam nontender or distended. No masses palpated. Extremities show no edema. neuro grossly intact  A/P  1 coronary artery disease-patient is status post coronary artery bypass graft with no chest pain.  Continue medical therapy.  Continue Plavix and statin.  2 ischemic cardiomyopathy-continue  present dose of Entresto and carvedilol.  3 chronic systolic congestive heart failure-patient is euvolemic on examination today.  Continue Lasix and Jardiance at present dose.  His spironolactone was discontinued previously due to hyperkalemia but based on 1 reading.  I will resume 12.5 mg daily.  Check potassium and renal function in 1 week.  4 history of paroxysmal atrial fibrillation-patient is in sinus rhythm today.  Continue apixaban.  5 ICD-Per Dr. Lovena Le.  6 hypertension-blood pressure controlled.  However I will discontinue amlodipine given his ischemic cardiomyopathy.  I have asked him to follow his blood pressure at home.  If his systolic is greater than 761 or diastolic greater than 85 we will add hydralazine/nitrates.  7 carotid artery disease-patient has had bilateral stents placed.  These were patent on most recent carotid Dopplers.  Continue medical therapy.  Followed by vascular surgery.  8 peripheral vascular disease-continue statin.  Followed by vascular surgery.  9 hyperlipidemia-patient did not tolerate higher doses of atorvastatin previously.  Check lipids and liver.  Kirk Ruths, MD

## 2022-01-19 ENCOUNTER — Encounter: Payer: Self-pay | Admitting: Cardiology

## 2022-01-19 ENCOUNTER — Ambulatory Visit: Payer: Medicaid Other | Attending: Cardiology | Admitting: Cardiology

## 2022-01-19 VITALS — BP 126/80 | HR 54 | Ht 67.0 in | Wt 231.8 lb

## 2022-01-19 DIAGNOSIS — Z951 Presence of aortocoronary bypass graft: Secondary | ICD-10-CM

## 2022-01-19 DIAGNOSIS — E7800 Pure hypercholesterolemia, unspecified: Secondary | ICD-10-CM

## 2022-01-19 DIAGNOSIS — I255 Ischemic cardiomyopathy: Secondary | ICD-10-CM | POA: Diagnosis not present

## 2022-01-19 DIAGNOSIS — I5022 Chronic systolic (congestive) heart failure: Secondary | ICD-10-CM | POA: Diagnosis not present

## 2022-01-19 DIAGNOSIS — I6523 Occlusion and stenosis of bilateral carotid arteries: Secondary | ICD-10-CM | POA: Diagnosis not present

## 2022-01-19 DIAGNOSIS — I480 Paroxysmal atrial fibrillation: Secondary | ICD-10-CM

## 2022-01-19 MED ORDER — SPIRONOLACTONE 25 MG PO TABS: 12.5000 mg | ORAL_TABLET | Freq: Every day | ORAL | 3 refills | Status: DC

## 2022-01-19 NOTE — Patient Instructions (Signed)
Medication Instructions:   STOP AMLODIPINE  START SPIRONOLACTONE 12.5 MG ONCE DAILY= 1/2 OF THE 25 MG TABLET ONCE DAILY  *If you need a refill on your cardiac medications before your next appointment, please call your pharmacy*   Lab Work:  Your physician recommends that you return for lab work in: ONE [facility]  If you have labs (blood work) drawn today and your tests are completely normal, you will receive your results only by: [place] (if you have MyChart) OR A paper copy in the mail If you have any lab test that is abnormal or we need to change your treatment, we will call you to review the results.  Follow-Up: At [facility], you and your health needs are our priority.  As part of our continuing mission to provide you with exceptional heart care, we have created designated Provider Care Teams.  These Care Teams include your primary Cardiologist (physician) and Advanced Practice Providers (APPs -  Physician Assistants and Nurse Practitioners) who all work together to provide you with the care you need, when you need it.  We recommend signing up for the patient portal called "MyChart".  Sign up information is provided on this After Visit Summary.  MyChart is used to connect with patients for Virtual Visits (Telemedicine).  Patients are able to view lab/test results, encounter notes, upcoming appointments, etc.  Non-urgent messages can be sent to your provider as well.   To learn more about what you can do with MyChart, go to [email or web address].    Your next appointment:   12 month(s)  The format for your next appointment:   In Person  Provider:   Kirk Ruths, MD

## 2022-01-24 ENCOUNTER — Other Ambulatory Visit: Payer: Self-pay | Admitting: *Deleted

## 2022-01-24 DIAGNOSIS — I480 Paroxysmal atrial fibrillation: Secondary | ICD-10-CM

## 2022-01-24 MED ORDER — APIXABAN 5 MG PO TABS: 5.0000 mg | ORAL_TABLET | Freq: Two times a day (BID) | ORAL | 5 refills | Status: DC

## 2022-01-24 NOTE — Telephone Encounter (Signed)
Eliquis '5mg'$  refill request received. Patient is 61 years old, weight-105.1kg, Crea-1.11 on 08/15/2021, Diagnosis-Afib, and last seen by Dr. Stanford Breed on 01/19/2022. Dose is appropriate based on dosing criteria. Will send in refill to requested pharmacy.

## 2022-01-25 ENCOUNTER — Ambulatory Visit (INDEPENDENT_AMBULATORY_CARE_PROVIDER_SITE_OTHER): Payer: Medicaid Other

## 2022-01-25 DIAGNOSIS — I255 Ischemic cardiomyopathy: Secondary | ICD-10-CM

## 2022-01-26 ENCOUNTER — Other Ambulatory Visit: Payer: Self-pay

## 2022-01-26 LAB — CUP PACEART REMOTE DEVICE CHECK
Date Time Interrogation Session: 20231005155409
Implantable Lead Implant Date: 20220706
Implantable Lead Location: 753860
Implantable Lead Model: 436909
Implantable Lead Serial Number: 81441117
Implantable Pulse Generator Implant Date: 20220706
Pulse Gen Model: 429525
Pulse Gen Serial Number: 84841030

## 2022-01-26 MED ORDER — CLOPIDOGREL BISULFATE 75 MG PO TABS: 75.0000 mg | ORAL_TABLET | Freq: Every day | ORAL | 11 refills | Status: DC

## 2022-01-30 LAB — COMPREHENSIVE METABOLIC PANEL
ALT: 22 IU/L (ref 0–44)
AST: 21 IU/L (ref 0–40)
Albumin/Globulin Ratio: 1.8 (ref 1.2–2.2)
Albumin: 4.4 g/dL (ref 3.8–4.9)
Alkaline Phosphatase: 99 IU/L (ref 44–121)
BUN/Creatinine Ratio: 20 (ref 9–20)
BUN: 25 mg/dL — ABNORMAL HIGH (ref 6–24)
Bilirubin Total: 0.5 mg/dL (ref 0.0–1.2)
CO2: 23 mmol/L (ref 20–29)
Calcium: 9.7 mg/dL (ref 8.7–10.2)
Chloride: 99 mmol/L (ref 96–106)
Creatinine, Ser: 1.22 mg/dL (ref 0.76–1.27)
Globulin, Total: 2.4 g/dL (ref 1.5–4.5)
Glucose: 149 mg/dL — ABNORMAL HIGH (ref 70–99)
Potassium: 4.8 mmol/L (ref 3.5–5.2)
Sodium: 141 mmol/L (ref 134–144)
Total Protein: 6.8 g/dL (ref 6.0–8.5)
eGFR: 68 mL/min/{1.73_m2} (ref >59)

## 2022-01-30 LAB — LIPID PANEL
Chol/HDL Ratio: 5.1 ratio — ABNORMAL HIGH (ref 0.0–5.0)
Cholesterol, Total: 148 mg/dL (ref 100–199)
HDL: 29 mg/dL — ABNORMAL LOW (ref >39)
LDL Chol Calc (NIH): 72 mg/dL (ref 0–99)
Triglycerides: 289 mg/dL — ABNORMAL HIGH (ref 0–149)
VLDL Cholesterol Cal: 47 mg/dL — ABNORMAL HIGH (ref 5–40)

## 2022-02-01 NOTE — Progress Notes (Signed)
Remote ICD transmission.   

## 2022-02-21 ENCOUNTER — Telehealth: Payer: Self-pay | Admitting: *Deleted

## 2022-02-21 DIAGNOSIS — E7800 Pure hypercholesterolemia, unspecified: Secondary | ICD-10-CM

## 2022-02-21 MED ORDER — ATORVASTATIN CALCIUM 80 MG PO TABS: 80.0000 mg | ORAL_TABLET | Freq: Every day | ORAL | 3 refills | Status: DC

## 2022-02-21 NOTE — Telephone Encounter (Signed)
pt aware of results  New script sent to the pharmacy  Lab orders mailed to the pt  

## 2022-02-21 NOTE — Telephone Encounter (Signed)
-----   Message from Lelon Perla, MD sent at 01/30/2022  7:55 AM EDT ----- Increase lipitor to 80 mg daily; lipids and liver 8 weeks Kirk Ruths

## 2022-03-24 LAB — LIPID PANEL
Chol/HDL Ratio: 4.8 ratio (ref 0.0–5.0)
Cholesterol, Total: 152 mg/dL (ref 100–199)
HDL: 32 mg/dL — ABNORMAL LOW (ref >39)
LDL Chol Calc (NIH): 70 mg/dL (ref 0–99)
Triglycerides: 315 mg/dL — ABNORMAL HIGH (ref 0–149)
VLDL Cholesterol Cal: 50 mg/dL — ABNORMAL HIGH (ref 5–40)

## 2022-03-24 LAB — HEPATIC FUNCTION PANEL
ALT: 35 IU/L (ref 0–44)
AST: 29 IU/L (ref 0–40)
Albumin: 4.5 g/dL (ref 3.8–4.9)
Alkaline Phosphatase: 94 IU/L (ref 44–121)
Bilirubin Total: 0.5 mg/dL (ref 0.0–1.2)
Bilirubin, Direct: 0.13 mg/dL (ref 0.00–0.40)
Total Protein: 7.1 g/dL (ref 6.0–8.5)

## 2022-03-26 ENCOUNTER — Encounter: Payer: Self-pay | Admitting: *Deleted

## 2022-04-26 ENCOUNTER — Ambulatory Visit (INDEPENDENT_AMBULATORY_CARE_PROVIDER_SITE_OTHER): Payer: Medicaid Other

## 2022-04-26 DIAGNOSIS — I255 Ischemic cardiomyopathy: Secondary | ICD-10-CM

## 2022-04-26 LAB — CUP PACEART REMOTE DEVICE CHECK
Battery Voltage: 3.1 V
Brady Statistic RV Percent Paced: 0 %
Date Time Interrogation Session: 20240104101324
HighPow Impedance: 89 Ohm
Implantable Lead Connection Status: 753985
Implantable Lead Implant Date: 20220706
Implantable Lead Location: 753860
Implantable Lead Model: 436909
Implantable Lead Serial Number: 81441117
Implantable Pulse Generator Implant Date: 20220706
Lead Channel Impedance Value: 697 Ohm
Lead Channel Pacing Threshold Amplitude: 0.8 V
Lead Channel Pacing Threshold Pulse Width: 0.4 ms
Lead Channel Sensing Intrinsic Amplitude: 19.7 mV
Lead Channel Sensing Intrinsic Amplitude: 5.3 mV
Lead Channel Setting Pacing Amplitude: 2 V
Lead Channel Setting Pacing Pulse Width: 0.4 ms
Lead Channel Setting Sensing Sensitivity: 0.8 mV
Pulse Gen Model: 429525
Pulse Gen Serial Number: 84841030

## 2022-05-16 NOTE — Progress Notes (Signed)
Remote ICD transmission.   

## 2022-06-14 ENCOUNTER — Other Ambulatory Visit: Payer: Self-pay

## 2022-06-14 MED ORDER — FUROSEMIDE 40 MG PO TABS: 40.0000 mg | ORAL_TABLET | Freq: Every day | ORAL | 1 refills | Status: DC

## 2022-06-18 ENCOUNTER — Other Ambulatory Visit: Payer: Self-pay | Admitting: *Deleted

## 2022-06-18 DIAGNOSIS — I739 Peripheral vascular disease, unspecified: Secondary | ICD-10-CM

## 2022-06-18 DIAGNOSIS — I6523 Occlusion and stenosis of bilateral carotid arteries: Secondary | ICD-10-CM

## 2022-06-26 ENCOUNTER — Ambulatory Visit (INDEPENDENT_AMBULATORY_CARE_PROVIDER_SITE_OTHER)
Admission: RE | Admit: 2022-06-26 | Discharge: 2022-06-26 | Disposition: A | Discharge status: Home / Self Care | Payer: Medicaid Other | Source: Ambulatory Visit | Attending: Surgery | Admitting: Surgery

## 2022-06-26 ENCOUNTER — Ambulatory Visit (INDEPENDENT_AMBULATORY_CARE_PROVIDER_SITE_OTHER): Payer: Medicaid Other | Admitting: Physician Assistant

## 2022-06-26 ENCOUNTER — Ambulatory Visit (HOSPITAL_COMMUNITY)
Admission: RE | Admit: 2022-06-26 | Discharge: 2022-06-26 | Disposition: A | Discharge status: Home / Self Care | Payer: Medicaid Other | Source: Ambulatory Visit | Attending: Vascular Surgery | Admitting: Vascular Surgery

## 2022-06-26 ENCOUNTER — Other Ambulatory Visit: Payer: Self-pay

## 2022-06-26 VITALS — BP 130/69 | HR 50 | Temp 97.5°F | Resp 20 | Ht 67.0 in | Wt 231.5 lb

## 2022-06-26 DIAGNOSIS — I739 Peripheral vascular disease, unspecified: Secondary | ICD-10-CM | POA: Diagnosis present

## 2022-06-26 DIAGNOSIS — I70213 Atherosclerosis of native arteries of extremities with intermittent claudication, bilateral legs: Secondary | ICD-10-CM

## 2022-06-26 DIAGNOSIS — I6523 Occlusion and stenosis of bilateral carotid arteries: Secondary | ICD-10-CM

## 2022-06-26 LAB — VAS US ABI WITH/WO TBI
Left ABI: 0.96
Right ABI: 0.92

## 2022-06-26 NOTE — Progress Notes (Signed)
Office Note     CC:  follow up Requesting Provider:  Meliton Rattan, MD  HPI: [name] is a 62 y.o. (1960-05-18) male who presents for surveillance of PAD.  He underwent staged left SFA stent and right SFA stenting by Dr. Trula Slade on 01/17/2021 and 02/07/2021 respectively.  He then underwent ultrasound-guided thrombin injection of the right common femoral artery pseudoaneurysm on 02/03/2021 by Dr. Stanford Breed.  He has generalized weakness of bilateral lower extremities after walking several 100 yards.  He denies any rest pain or tissue loss of bilateral lower extremities.  He is on Plavix and Eliquis daily.  He is also on a daily statin.  He denies tobacco use.  Surgical history also significant for staged TCAR with right sided coming on 12/01/2020 and left side on 12/30/2020.  He has not had any strokelike symptoms or neurological events since last office visit.  He has not scheduled for surveillance of carotid stents for another 6 months.   Past Medical History:  Diagnosis Date   AICD (automatic cardioverter/defibrillator) present    Anginal pain ([place])    Anxiety    CAD (coronary artery disease)    CHF (congestive heart failure) (HCC)    Chronic back pain    Colon polyps    Diabetes mellitus    Heart attack (HCC)    Hyperlipidemia    Hypertension    Neuropathy    Panic attacks    PVD (peripheral vascular disease) ([place])    Sleep apnea     Past Surgical History:  Procedure Laterality Date   ABDOMINAL AORTOGRAM W/LOWER EXTREMITY N/A 01/17/2021   Procedure: ABDOMINAL AORTOGRAM W/LOWER EXTREMITY;  Surgeon: Serafina Mitchell, MD;  Location: [facility];  Service: Cardiovascular;  Laterality: N/A;   ABDOMINAL AORTOGRAM W/LOWER EXTREMITY N/A 02/07/2021   Procedure: ABDOMINAL AORTOGRAM W/LOWER EXTREMITY;  Surgeon: Serafina Mitchell, MD;  Location: [facility];  Service: Cardiovascular;  Laterality: N/A;   APPLICATION OF A-CELL OF BACK N/A 12/09/2019   Procedure: APPLICATION  OF A-CELL OF BACK;  Surgeon: Wonda Olds, MD;  [facility];  Service: Thoracic;  Laterality: N/A;   APPLICATION OF A-CELL OF CHEST/ABDOMEN N/A 12/04/2019   Procedure: APPLICATION OF A-CELL OF CHEST;  Surgeon: Wonda Olds, MD;  [facility];  Service: Thoracic;  Laterality: N/A;   APPLICATION OF WOUND VAC N/A 11/27/2019   Procedure: APPLICATION OF WOUND VAC;  Surgeon: Ivin Poot, MD;  Location: [facility];  Service: Cardiovascular;  Laterality: N/A;  Lower midline superficial sternal wound area.   APPLICATION OF WOUND VAC N/A 12/09/2019   Procedure: WOUND VAC EXCHANGE;  Surgeon: Wonda Olds, MD;  [facility];  Service: Thoracic;  Laterality: N/A;   APPLICATION OF WOUND VAC N/A 12/14/2019   Procedure: WOUND VAC CHANGE;  Surgeon: Wonda Olds, MD;  [facility];  Service: Thoracic;  Laterality: N/A;   CORONARY ANGIOPLASTY WITH STENT PLACEMENT     CORONARY ARTERY BYPASS GRAFT N/A 10/29/2019   Procedure: CORONARY ARTERY BYPASS GRAFTING (CABG) using LIMA to LAD; RIMA to PDA; and Left radial harvest vein to Circ.;  Surgeon: Wonda Olds, MD;  Location: [facility];  Service: Open Heart Surgery;  Laterality: N/A;   EAR CYST EXCISION  12/10/2011   Procedure: CYST REMOVAL;  Surgeon: Gae Bon, DDS;  Location: [facility];  Service: Oral Surgery;  Laterality: Left;  Left Mandible Cyst Removal   ICD IMPLANT N/A 10/26/2020  Procedure: ICD IMPLANT;  Surgeon: Evans Lance, MD;  Location: [facility];  Service: Cardiovascular;  Laterality: N/A;   IR THORACENTESIS ASP PLEURAL SPACE W/IMG GUIDE  10/27/2019   MULTIPLE EXTRACTIONS WITH ALVEOLOPLASTY  12/10/2011   Procedure: MULTIPLE EXTRACION WITH ALVEOLOPLASTY;  Surgeon: Gae Bon, DDS;  Location: [facility];  Service: Oral Surgery;  Laterality: N/A;  Right Maxillary Tuberosity Reduction; Right  Maxillary Buccal Exostosis; Bilateral Mandibular Tori    PERIPHERAL VASCULAR BALLOON ANGIOPLASTY Right 02/07/2021   Procedure: PERIPHERAL  VASCULAR BALLOON ANGIOPLASTY;  Surgeon: Serafina Mitchell, MD;  Location: [facility];  Service: Cardiovascular;  Laterality: Right;  Superficial femoral artery   PERIPHERAL VASCULAR CATHETERIZATION N/A 12/28/2014   Procedure: Abdominal Aortogram;  Surgeon: Serafina Mitchell, MD;  Location: [facility];  Service: Cardiovascular;  Laterality: N/A;   PERIPHERAL VASCULAR CATHETERIZATION N/A 12/20/2015   Procedure: Abdominal Aortogram;  Surgeon: Serafina Mitchell, MD;  Location: [facility];  Service: Cardiovascular;  Laterality: N/A;   PERIPHERAL VASCULAR CATHETERIZATION  12/20/2015   Procedure: Peripheral Vascular Balloon Angioplasty;  Surgeon: Serafina Mitchell, MD;  Location: [facility];  Service: Cardiovascular;;   PERIPHERAL VASCULAR INTERVENTION Left 01/17/2021   Procedure: PERIPHERAL VASCULAR INTERVENTION;  Surgeon: Serafina Mitchell, MD;  Location: [facility];  Service: Cardiovascular;  Laterality: Left;  Left popiteal and left SFA   RADIAL ARTERY HARVEST Left 10/29/2019   Procedure: RADIAL ARTERY HARVEST;  Surgeon: Wonda Olds, MD;  Location: [place] [place];  Service: Open Heart Surgery;  Laterality: Left;   RIGHT/LEFT HEART CATH AND CORONARY ANGIOGRAPHY N/A 10/28/2019   Procedure: RIGHT/LEFT HEART CATH AND CORONARY ANGIOGRAPHY;  Surgeon: Burnell Blanks, MD;  Location: [facility];  Service: Cardiovascular;  Laterality: N/A;   STERNAL WOUND DEBRIDEMENT N/A 12/01/2019   Procedure: STERNAL WOUND DEBRIDEMENT and WOUND VAC CHANGE;  Surgeon: Wonda Olds, MD;  Location: [facility];  Service: Thoracic;  Laterality: N/A;   STERNAL WOUND DEBRIDEMENT N/A 12/04/2019   Procedure: STERNAL WOUND DEBRIDEMENT and WOUND VAC CHANGE;  Surgeon: Wonda Olds, MD;  Location: [facility];  Service: Thoracic;  Laterality: N/A;   STERNAL WOUND DEBRIDEMENT N/A 12/09/2019   Procedure: STERNAL WOUND DEBRIDEMENT;  Surgeon: Wonda Olds, MD;  [facility];  Service: Thoracic;   Laterality: N/A;   TEE WITHOUT CARDIOVERSION N/A 10/29/2019   Procedure: TRANSESOPHAGEAL ECHOCARDIOGRAM (TEE);  Surgeon: Wonda Olds, MD;  Location: [facility] [place];  Service: Open Heart Surgery;  Laterality: N/A;   TRANSCAROTID ARTERY REVASCULARIZATION  Right 12/01/2020   Procedure: RIGHT TRANSCAROTID ARTERY REVASCULARIZATION;  Surgeon: Serafina Mitchell, MD;  [facility];  Service: Vascular;  Laterality: Right;   TRANSCAROTID ARTERY REVASCULARIZATION  Left 12/30/2020   Procedure: LEFT TRANSCAROTID ARTERY REVASCULARIZATION;  Surgeon: Serafina Mitchell, MD;  [facility];  Service: Vascular;  Laterality: Left;   ULTRASOUND GUIDANCE FOR VASCULAR ACCESS Left 12/01/2020   Procedure: ULTRASOUND GUIDANCE FOR VASCULAR ACCESS;  Surgeon: Serafina Mitchell, MD;  [facility];  Service: Vascular;  Laterality: Left;   ULTRASOUND GUIDANCE FOR VASCULAR ACCESS Right 12/30/2020   Procedure: ULTRASOUND GUIDANCE FOR VASCULAR ACCESS, RIGHT FEMORAL VEIN;  Surgeon: Serafina Mitchell, MD;  Location: [facility];  Service: Vascular;  Laterality: Right;   WOUND EXPLORATION N/A 11/27/2019   Procedure: EXCISIONAL DEBRIDEMENT OF SUPERFICIAL STERNAL WOUND EXPLORATION;  Surgeon: Ivin Poot, MD;  Location: [place];  Service: Cardiovascular;  Laterality: N/A;  Superficial sternal wound    Social History   Socioeconomic History   Marital status: Legally Separated    Spouse name: Not on file   Number of children: 3   Years of education: Not on file   Highest education level: Not on file  Occupational History    Comment: Unemployed  Tobacco Use   Smoking status: Former    Packs/day: 0.25    Years: 25.00    Total pack years: 6.25    Types: Cigarettes    Start date: 89    Quit date: 10/23/2019    Years since quitting: 2.6    Passive exposure: Never   Smokeless tobacco: Never   Tobacco comments:    1/4 pack per day  Vaping Use   Vaping Use: Never used  Substance and Sexual Activity   Alcohol use: No    Alcohol/week:  0.0 standard drinks of alcohol   Drug use: No   Sexual activity: Not on file  Other Topics Concern   Not on file  Social History Narrative   Not on file   Social Determinants of Health   Financial Resource Strain: Low Risk  (06/06/2020)   Overall Financial Resource Strain (CARDIA)    Difficulty of Paying Living Expenses: Not very hard  Food Insecurity: No Food Insecurity (06/06/2020)   Hunger Vital Sign    Worried About Running Out of Food in the Last Year: Never true    Ran Out of Food in the Last Year: Never true  Transportation Needs: No Transportation Needs (06/06/2020)   PRAPARE - Hydrologist (Medical): No    Lack of Transportation (Non-Medical): No  Physical Activity: Insufficiently Active (10/29/2019)   Exercise Vital Sign    Days of Exercise per Week: 1 day    Minutes of Exercise per Session: 30 min  Stress: Not on file  Social Connections: Socially Isolated (10/29/2019)   Social Connection and Isolation Panel [NHANES]    Frequency of Communication with Friends and Family: More than three times a week    Frequency of Social Gatherings with Friends and Family: More than three times a week    Attends Religious Services: Never    Marine scientist or Organizations: No    Attends Music therapist: Never    Marital Status: Divorced  Human resources officer Violence: Not on file    Family History  Problem Relation Age of Onset   Diabetes Father    Kidney failure Father    Hypertension Father    Hyperlipidemia Father    Heart disease Father     Current Outpatient Medications  Medication Sig Dispense Refill   ACCU-CHEK GUIDE test strip      apixaban (ELIQUIS) 5 MG TABS tablet Take 1 tablet (5 mg total) by mouth 2 (two) times daily. 60 tablet 5   atorvastatin (LIPITOR) 80 MG tablet Take 1 tablet (80 mg total) by mouth daily. 90 tablet 3   carvedilol (COREG) 25 MG tablet Take 1 tablet (25 mg total) by mouth 2 (two) times daily with a  meal. 180 tablet 1   clopidogrel (PLAVIX) 75 MG tablet Take 1 tablet (75 mg total) by mouth daily. 30 tablet 11   Dulaglutide 1.5 MG/0.5ML SOPN Inject 1.5 mg into the skin once a week.     empagliflozin (JARDIANCE) 25 MG TABS tablet Take 1 tablet (25 mg total) by mouth daily before breakfast. 30 tablet    furosemide (LASIX) 40 MG tablet  Take 1 tablet (40 mg total) by mouth daily. 90 tablet 1   metFORMIN (GLUCOPHAGE) 1000 MG tablet Take 1,000 mg by mouth 2 (two) times daily.     OXYGEN Inhale 3 L/min into the lungs at bedtime as needed (for shortness of breath).     sacubitril-valsartan (ENTRESTO) 97-103 MG Take 1 tablet by mouth 2 (two) times daily. 180 tablet 3   spironolactone (ALDACTONE) 25 MG tablet Take 0.5 tablets (12.5 mg total) by mouth daily. 45 tablet 3   TRESIBA FLEXTOUCH 100 UNIT/ML FlexTouch Pen Inject 30 Units into the skin daily.     ezetimibe (ZETIA) 10 MG tablet Take 1 tablet (10 mg total) by mouth daily. (Patient not taking: Reported on 06/26/2022) 30 tablet 2   Current Facility-Administered Medications  Medication Dose Route Frequency Provider Last Rate Last Admin   ezetimibe (ZETIA) tablet 10 mg  10 mg Oral Daily Setzer, Edman Circle, PA-C        Allergies  Allergen Reactions   Codeine Itching and Shortness Of Breath   Gabapentin Other (See Comments)    Suicidal thoughts, extreme dreams   Pregabalin Other (See Comments)    Suicidal thoughts, extreme dreams    Simvastatin Other (See Comments)    Pain in joints   Lisinopril Itching and Rash   Morphine Rash   Venlafaxine Rash     REVIEW OF SYSTEMS:   '[X]'$  denotes positive finding, '[ ]'$  denotes negative finding Cardiac  Comments:  Chest pain or chest pressure:    Shortness of breath upon exertion:    Short of breath when lying flat:    Irregular heart rhythm:        Vascular    Pain in calf, thigh, or hip brought on by ambulation:    Pain in feet at night that wakes you up from your sleep:     Blood clot in your  veins:    Leg swelling:         Pulmonary    Oxygen at home:    Productive cough:     Wheezing:         Neurologic    Sudden weakness in arms or legs:     Sudden numbness in arms or legs:     Sudden onset of difficulty speaking or slurred speech:    Temporary loss of vision in one eye:     Problems with dizziness:         Gastrointestinal    Blood in stool:     Vomited blood:         Genitourinary    Burning when urinating:     Blood in urine:        Psychiatric    Major depression:         Hematologic    Bleeding problems:    Problems with blood clotting too easily:        Skin    Rashes or ulcers:        Constitutional    Fever or chills:      PHYSICAL EXAMINATION:  Vitals:   06/26/22 0839  BP: 130/69  Pulse: (!) 50  Resp: 20  Temp: (!) 97.5 F (36.4 C)  TempSrc: Temporal  SpO2: 96%  Weight: 231 lb 8 oz (105 kg)  Height: '5\' 7"'$  (1.702 m)    General:  WDWN in NAD; vital signs documented above Gait: Not observed HENT: WNL, normocephalic Pulmonary: normal non-labored breathing , without Rales, rhonchi,  wheezing Cardiac:  regular HR Abdomen: soft, NT, no masses Skin: without rashes Vascular Exam/Pulses:  Right Left  Radial 2+ (normal) 2+ (normal)  DP absent absent  PT absent absent   Extremities: without ischemic changes, without Gangrene , without cellulitis; without open wounds;  Musculoskeletal: no muscle wasting or atrophy  Neurologic: A&O X 3;  No focal weakness or paresthesias are detected Psychiatric:  The pt has Normal affect.   Non-Invasive Vascular Imaging:   Right common femoral artery 246 cm/s Right SFA stent with 204 cm/s in the proximal stent and 221 cm/s in the distal stent  Left SFA stent widely patent  ABI/TBIToday's ABIToday's TBIPrevious ABIPrevious TBI  +-------+-----------+-----------+------------+------------+  Right 0.92       0.62       0.92        0.70           +-------+-----------+-----------+------------+------------+  Left  0.96       0.67       1.03        0.69              ASSESSMENT/PLAN:: 62 y.o. male here for follow up for surveillance of PAD with history of bilateral SFA stenting  -Subjectively the patient complains of generalized weakness in bilateral lower extremities however this occurs only after walking several 100 yards.  He does not have any traditional calf claudication.  Duplex demonstrates widely patent SFA stents.  He has some evidence of in-stent stenosis of the right SFA however does not seem to be of hemodynamic significance.  ABIs and TBI's are essentially unchanged.  Continue Plavix and Eliquis daily.  Continue statin daily.  We will repeat bilateral lower extremity arterial duplex and ABIs in 6 months.  At that time we will also repeat carotid duplex.  If both studies are unremarkable he can be followed on an annual basis.   Dagoberto Ligas, PA-C [facility] [phone]  Clinic MD:   Stanford Breed

## 2022-07-01 ENCOUNTER — Encounter (HOSPITAL_COMMUNITY): Payer: Medicaid Other

## 2022-07-24 ENCOUNTER — Other Ambulatory Visit: Payer: Self-pay | Admitting: Cardiology

## 2022-07-24 DIAGNOSIS — I480 Paroxysmal atrial fibrillation: Secondary | ICD-10-CM

## 2022-07-24 NOTE — Telephone Encounter (Signed)
Prescription refill request for Eliquis received. Indication: AF Last office visit: 01/19/22  Thresa Ross MD Scr: 1.22 on 05/29/22  Epic Age: 62 Weight: 105.1kg  Based on above findings Eliquis 5mg  twice daily is the appropriate dose.  Refill approved.

## 2022-07-27 ENCOUNTER — Ambulatory Visit (INDEPENDENT_AMBULATORY_CARE_PROVIDER_SITE_OTHER): Payer: Medicaid Other

## 2022-07-27 DIAGNOSIS — I255 Ischemic cardiomyopathy: Secondary | ICD-10-CM

## 2022-07-27 LAB — CUP PACEART REMOTE DEVICE CHECK
Date Time Interrogation Session: 20240405074032
Implantable Lead Connection Status: 753985
Implantable Lead Implant Date: 20220706
Implantable Lead Location: 753860
Implantable Lead Model: 436909
Implantable Lead Serial Number: 81441117
Implantable Pulse Generator Implant Date: 20220706
Pulse Gen Model: 429525
Pulse Gen Serial Number: 84841030

## 2022-08-28 NOTE — Progress Notes (Signed)
Remote ICD transmission.   

## 2022-09-25 ENCOUNTER — Telehealth: Payer: Self-pay | Admitting: Cardiology

## 2022-09-25 ENCOUNTER — Telehealth: Payer: Self-pay

## 2022-09-25 NOTE — Telephone Encounter (Signed)
Biotron[facility] alert received for elevated heart rates 09/24/22 @ 10:58 AM. Patient called and states he was changing a flat tire during this time. Denies any symptoms. PT aware to call if any symptoms arise. Will continue to monitor.

## 2022-09-25 NOTE — Telephone Encounter (Signed)
*  STAT* If patient is at the pharmacy, call can be transferred to refill team.   1. Which medications need to be refilled? (please list name of each medication and dose if known)   carvedilol (COREG) 25 MG tablet  furosemide (LASIX) 40 MG tablet   2. Which pharmacy/location (including street and city if local pharmacy) is medication to be sent to?  [facility] - [place], [place] - [address]   3. Do they need a 30 day or 90 day supply?  90 day  Patient stated he is completely out of these medications. Patient has appointment scheduled on 9/25.

## 2022-10-16 ENCOUNTER — Telehealth: Payer: Self-pay | Admitting: Cardiology

## 2022-10-16 MED ORDER — CARVEDILOL 25 MG PO TABS: 25.0000 mg | ORAL_TABLET | Freq: Two times a day (BID) | ORAL | 0 refills | Status: DC

## 2022-10-16 NOTE — Telephone Encounter (Signed)
*  STAT* If patient is at the pharmacy, call can be transferred to refill team.   1. Which medications need to be refilled? (please list name of each medication and dose if known) carvedilol (COREG) 25 MG tablet  2. Which pharmacy/location (including street and city if local pharmacy) is medication to be sent to? [facility] - [place], [place] - [address]   3. Do they need a 30 day or 90 day supply?  90 day supply  Patient states he has been completely out of medication for 1 month. See previous encounter.

## 2022-10-16 NOTE — Telephone Encounter (Signed)
Rx(s) sent to pharmacy electronically.  Patient notified directly and voiced understanding.  

## 2022-10-26 ENCOUNTER — Ambulatory Visit (INDEPENDENT_AMBULATORY_CARE_PROVIDER_SITE_OTHER): Payer: Medicaid Other

## 2022-10-26 DIAGNOSIS — I255 Ischemic cardiomyopathy: Secondary | ICD-10-CM | POA: Diagnosis not present

## 2022-10-26 LAB — CUP PACEART REMOTE DEVICE CHECK
Battery Voltage: 3.1 V
Brady Statistic RV Percent Paced: 0 %
Date Time Interrogation Session: 20240705091213
HighPow Impedance: 73 Ohm
Implantable Lead Connection Status: 753985
Implantable Lead Implant Date: 20220706
Implantable Lead Location: 753860
Implantable Lead Model: 436909
Implantable Lead Serial Number: 81441117
Implantable Pulse Generator Implant Date: 20220706
Lead Channel Impedance Value: 560 Ohm
Lead Channel Pacing Threshold Amplitude: 0.7 V
Lead Channel Pacing Threshold Pulse Width: 0.4 ms
Lead Channel Sensing Intrinsic Amplitude: 18.3 mV
Lead Channel Sensing Intrinsic Amplitude: 3.7 mV
Lead Channel Setting Pacing Amplitude: 2 V
Lead Channel Setting Pacing Pulse Width: 0.4 ms
Lead Channel Setting Sensing Sensitivity: 0.8 mV
Pulse Gen Model: 429525
Pulse Gen Serial Number: 84841030

## 2022-10-28 ENCOUNTER — Inpatient Hospital Stay (HOSPITAL_COMMUNITY)
Admission: EM | Admit: 2022-10-28 | Discharge: 2022-10-31 | DRG: 291 | Disposition: A | Discharge status: Home / Self Care | Payer: Medicaid Other | Attending: Student in an Organized Health Care Education/Training Program | Admitting: Student in an Organized Health Care Education/Training Program

## 2022-10-28 ENCOUNTER — Other Ambulatory Visit: Payer: Self-pay

## 2022-10-28 ENCOUNTER — Emergency Department (HOSPITAL_COMMUNITY): Payer: Medicaid Other

## 2022-10-28 ENCOUNTER — Encounter (HOSPITAL_COMMUNITY): Payer: Self-pay | Admitting: Student in an Organized Health Care Education/Training Program

## 2022-10-28 DIAGNOSIS — Z951 Presence of aortocoronary bypass graft: Secondary | ICD-10-CM

## 2022-10-28 DIAGNOSIS — Z87891 Personal history of nicotine dependence: Secondary | ICD-10-CM

## 2022-10-28 DIAGNOSIS — E1165 Type 2 diabetes mellitus with hyperglycemia: Secondary | ICD-10-CM | POA: Diagnosis present

## 2022-10-28 DIAGNOSIS — I110 Hypertensive heart disease with heart failure: Secondary | ICD-10-CM | POA: Diagnosis not present

## 2022-10-28 DIAGNOSIS — G8929 Other chronic pain: Secondary | ICD-10-CM | POA: Diagnosis present

## 2022-10-28 DIAGNOSIS — I5022 Chronic systolic (congestive) heart failure: Secondary | ICD-10-CM

## 2022-10-28 DIAGNOSIS — Z885 Allergy status to narcotic agent status: Secondary | ICD-10-CM

## 2022-10-28 DIAGNOSIS — Z95810 Presence of automatic (implantable) cardiac defibrillator: Secondary | ICD-10-CM

## 2022-10-28 DIAGNOSIS — Z7902 Long term (current) use of antithrombotics/antiplatelets: Secondary | ICD-10-CM

## 2022-10-28 DIAGNOSIS — Z7985 Long-term (current) use of injectable non-insulin antidiabetic drugs: Secondary | ICD-10-CM

## 2022-10-28 DIAGNOSIS — E1151 Type 2 diabetes mellitus with diabetic peripheral angiopathy without gangrene: Secondary | ICD-10-CM | POA: Diagnosis present

## 2022-10-28 DIAGNOSIS — R0602 Shortness of breath: Secondary | ICD-10-CM

## 2022-10-28 DIAGNOSIS — E119 Type 2 diabetes mellitus without complications: Secondary | ICD-10-CM

## 2022-10-28 DIAGNOSIS — E785 Hyperlipidemia, unspecified: Secondary | ICD-10-CM | POA: Diagnosis present

## 2022-10-28 DIAGNOSIS — Z8249 Family history of ischemic heart disease and other diseases of the circulatory system: Secondary | ICD-10-CM

## 2022-10-28 DIAGNOSIS — Z833 Family history of diabetes mellitus: Secondary | ICD-10-CM

## 2022-10-28 DIAGNOSIS — I5021 Acute systolic (congestive) heart failure: Secondary | ICD-10-CM | POA: Diagnosis not present

## 2022-10-28 DIAGNOSIS — Z7901 Long term (current) use of anticoagulants: Secondary | ICD-10-CM

## 2022-10-28 DIAGNOSIS — I509 Heart failure, unspecified: Secondary | ICD-10-CM

## 2022-10-28 DIAGNOSIS — I10 Essential (primary) hypertension: Secondary | ICD-10-CM | POA: Diagnosis present

## 2022-10-28 DIAGNOSIS — I5023 Acute on chronic systolic (congestive) heart failure: Secondary | ICD-10-CM | POA: Diagnosis present

## 2022-10-28 DIAGNOSIS — I4891 Unspecified atrial fibrillation: Secondary | ICD-10-CM

## 2022-10-28 DIAGNOSIS — I2510 Atherosclerotic heart disease of native coronary artery without angina pectoris: Secondary | ICD-10-CM | POA: Diagnosis present

## 2022-10-28 DIAGNOSIS — G4733 Obstructive sleep apnea (adult) (pediatric): Secondary | ICD-10-CM | POA: Diagnosis present

## 2022-10-28 DIAGNOSIS — Z7984 Long term (current) use of oral hypoglycemic drugs: Secondary | ICD-10-CM

## 2022-10-28 DIAGNOSIS — Z888 Allergy status to other drugs, medicaments and biological substances status: Secondary | ICD-10-CM

## 2022-10-28 DIAGNOSIS — Z955 Presence of coronary angioplasty implant and graft: Secondary | ICD-10-CM

## 2022-10-28 DIAGNOSIS — I480 Paroxysmal atrial fibrillation: Secondary | ICD-10-CM | POA: Diagnosis present

## 2022-10-28 DIAGNOSIS — E1140 Type 2 diabetes mellitus with diabetic neuropathy, unspecified: Secondary | ICD-10-CM | POA: Diagnosis present

## 2022-10-28 DIAGNOSIS — Z79899 Other long term (current) drug therapy: Secondary | ICD-10-CM

## 2022-10-28 LAB — CBC
HCT: 45.4 % (ref 39.0–52.0)
Hemoglobin: 14.2 g/dL (ref 13.0–17.0)
MCH: 28.9 pg (ref 26.0–34.0)
MCHC: 31.3 g/dL (ref 30.0–36.0)
MCV: 92.3 fL (ref 80.0–100.0)
Platelets: 287 10*3/uL (ref 150–400)
RBC: 4.92 MIL/uL (ref 4.22–5.81)
RDW: 16.6 % — ABNORMAL HIGH (ref 11.5–15.5)
WBC: 9.8 10*3/uL (ref 4.0–10.5)
nRBC: 0 % (ref 0.0–0.2)

## 2022-10-28 LAB — BASIC METABOLIC PANEL
Anion gap: 14 (ref 5–15)
BUN: 13 mg/dL (ref 6–20)
CO2: 29 mmol/L (ref 22–32)
Calcium: 8.9 mg/dL (ref 8.9–10.3)
Chloride: 97 mmol/L — ABNORMAL LOW (ref 98–111)
Creatinine, Ser: 1.18 mg/dL (ref 0.61–1.24)
GFR, Estimated: >60 mL/min (ref >60)
Glucose, Bld: 103 mg/dL — ABNORMAL HIGH (ref 70–99)
Potassium: 3.9 mmol/L (ref 3.5–5.1)
Sodium: 140 mmol/L (ref 135–145)

## 2022-10-28 LAB — GLUCOSE, CAPILLARY
Glucose-Capillary: 146 mg/dL — ABNORMAL HIGH (ref 70–99)
Glucose-Capillary: 145 mg/dL — ABNORMAL HIGH (ref 70–99)

## 2022-10-28 LAB — MAGNESIUM: Magnesium: 2.1 mg/dL (ref 1.7–2.4)

## 2022-10-28 LAB — BRAIN NATRIURETIC PEPTIDE: B Natriuretic Peptide: 796.1 pg/mL — ABNORMAL HIGH (ref 0.0–100.0)

## 2022-10-28 LAB — TROPONIN I (HIGH SENSITIVITY)
Troponin I (High Sensitivity): 36 ng/L — ABNORMAL HIGH (ref <18)
Troponin I (High Sensitivity): 29 ng/L — ABNORMAL HIGH (ref <18)

## 2022-10-28 MED ORDER — INSULIN ASPART 100 UNIT/ML IJ SOLN
0.0000 [IU] | Freq: Every day | INTRAMUSCULAR | Status: DC
  Administered 2022-10-29: 2 [IU] via SUBCUTANEOUS

## 2022-10-28 MED ORDER — CLOPIDOGREL BISULFATE 75 MG PO TABS
75.0000 mg | ORAL_TABLET | Freq: Every day | ORAL | Status: DC
  Administered 2022-10-28 – 2022-10-31 (×4): 75 mg via ORAL
  Filled 2022-10-28 (×4): qty 1

## 2022-10-28 MED ORDER — ACETAMINOPHEN 325 MG PO TABS
650.0000 mg | ORAL_TABLET | Freq: Four times a day (QID) | ORAL | Status: DC | PRN
  Administered 2022-10-28: 650 mg via ORAL
  Filled 2022-10-28: qty 2

## 2022-10-28 MED ORDER — NITROGLYCERIN 2 % TD OINT
1.0000 [in_us] | TOPICAL_OINTMENT | Freq: Four times a day (QID) | TRANSDERMAL | Status: DC
  Administered 2022-10-28: 1 [in_us] via TOPICAL
  Filled 2022-10-28: qty 1

## 2022-10-28 MED ORDER — INSULIN ASPART 100 UNIT/ML IJ SOLN
0.0000 [IU] | Freq: Three times a day (TID) | INTRAMUSCULAR | Status: DC
  Administered 2022-10-28: 2 [IU] via SUBCUTANEOUS
  Administered 2022-10-29: 3 [IU] via SUBCUTANEOUS
  Administered 2022-10-29: 2 [IU] via SUBCUTANEOUS
  Administered 2022-10-30: 3 [IU] via SUBCUTANEOUS
  Administered 2022-10-30: 2 [IU] via SUBCUTANEOUS
  Administered 2022-10-30 – 2022-10-31 (×2): 3 [IU] via SUBCUTANEOUS

## 2022-10-28 MED ORDER — ALUM & MAG HYDROXIDE-SIMETH 200-200-20 MG/5ML PO SUSP
15.0000 mL | ORAL | Status: DC | PRN
  Administered 2022-10-28 – 2022-10-30 (×2): 15 mL via ORAL
  Filled 2022-10-28 (×2): qty 30

## 2022-10-28 MED ORDER — FUROSEMIDE 10 MG/ML IJ SOLN
40.0000 mg | Freq: Two times a day (BID) | INTRAMUSCULAR | Status: AC
  Administered 2022-10-29 (×2): 40 mg via INTRAVENOUS
  Filled 2022-10-28 (×2): qty 4

## 2022-10-28 MED ORDER — CARVEDILOL 25 MG PO TABS
25.0000 mg | ORAL_TABLET | Freq: Two times a day (BID) | ORAL | Status: DC
  Administered 2022-10-28 – 2022-10-31 (×6): 25 mg via ORAL
  Filled 2022-10-28 (×6): qty 1

## 2022-10-28 MED ORDER — FUROSEMIDE 10 MG/ML IJ SOLN
40.0000 mg | Freq: Once | INTRAMUSCULAR | Status: AC
  Administered 2022-10-28: 40 mg via INTRAVENOUS
  Filled 2022-10-28: qty 4

## 2022-10-28 MED ORDER — ACETAMINOPHEN 650 MG RE SUPP: 650.0000 mg | Freq: Four times a day (QID) | RECTAL | Status: DC | PRN

## 2022-10-28 MED ORDER — APIXABAN 5 MG PO TABS
5.0000 mg | ORAL_TABLET | Freq: Two times a day (BID) | ORAL | Status: DC
  Administered 2022-10-28 – 2022-10-31 (×6): 5 mg via ORAL
  Filled 2022-10-28 (×6): qty 1

## 2022-10-28 MED ORDER — INSULIN GLARGINE-YFGN 100 UNIT/ML ~~LOC~~ SOLN
50.0000 [IU] | Freq: Every day | SUBCUTANEOUS | Status: DC
  Administered 2022-10-29 – 2022-10-31 (×3): 50 [IU] via SUBCUTANEOUS
  Filled 2022-10-28 (×3): qty 0.5

## 2022-10-28 MED ORDER — EMPAGLIFLOZIN 25 MG PO TABS
25.0000 mg | ORAL_TABLET | Freq: Every day | ORAL | Status: DC
  Administered 2022-10-29 – 2022-10-31 (×3): 25 mg via ORAL
  Filled 2022-10-28 (×3): qty 1

## 2022-10-28 MED ORDER — ACETAMINOPHEN 325 MG PO TABS
650.0000 mg | ORAL_TABLET | Freq: Once | ORAL | Status: AC
  Administered 2022-10-28: 650 mg via ORAL
  Filled 2022-10-28: qty 2

## 2022-10-28 MED ORDER — ATORVASTATIN CALCIUM 80 MG PO TABS
80.0000 mg | ORAL_TABLET | Freq: Every day | ORAL | Status: DC
  Administered 2022-10-29 – 2022-10-31 (×3): 80 mg via ORAL
  Filled 2022-10-28 (×3): qty 1

## 2022-10-28 MED ORDER — POTASSIUM CHLORIDE CRYS ER 20 MEQ PO TBCR
20.0000 meq | EXTENDED_RELEASE_TABLET | Freq: Once | ORAL | Status: AC
  Administered 2022-10-28: 20 meq via ORAL
  Filled 2022-10-28: qty 1

## 2022-10-28 MED ORDER — SPIRONOLACTONE 12.5 MG HALF TABLET
12.5000 mg | ORAL_TABLET | Freq: Every day | ORAL | Status: DC
  Administered 2022-10-28 – 2022-10-30 (×3): 12.5 mg via ORAL
  Filled 2022-10-28 (×3): qty 1

## 2022-10-28 MED ORDER — SACUBITRIL-VALSARTAN 97-103 MG PO TABS
1.0000 | ORAL_TABLET | Freq: Two times a day (BID) | ORAL | Status: DC
  Administered 2022-10-28 – 2022-10-30 (×4): 1 via ORAL
  Filled 2022-10-28 (×5): qty 1

## 2022-10-28 NOTE — ED Notes (Signed)
ED TO INPATIENT HANDOFF REPORT  ED Nurse Name and Phone #: [phone]  S Name/Age/Gender [name] 62 y.o. male Room/Bed: 044C/044C  Code Status   Code Status: Full Code  Home/SNF/Other Home Patient oriented to: self, place, time, and situation Is this baseline? Yes   Triage Complete: Triage complete  Chief Complaint Acute exacerbation of CHF (congestive heart failure) (HCC) [I50.9]  Triage Note PT complains of fluid build up in stomach and left arm from CHF x 4 days. Also complains of back pain. Pt SOB worse with exertion.    Allergies Allergies  Allergen Reactions   Codeine Itching and Shortness Of Breath   Gabapentin Other (See Comments)    Suicidal thoughts, extreme dreams   Pregabalin Other (See Comments)    Suicidal thoughts, extreme dreams    Simvastatin Other (See Comments)    Pain in joints   Lisinopril Itching and Rash   Morphine Rash   Venlafaxine Rash    Level of Care/Admitting Diagnosis ED Disposition     ED Disposition  Admit   Condition  --   Comment  Hospital Area: [facility] [[id number]]  Level of Care: Med-Surg [16]  May place patient in observation at [facility] or [facility] if equivalent level of care is available:: No  Covid Evaluation: Asymptomatic - no recent exposure (last 10 days) testing not required  Diagnosis: Acute exacerbation of CHF (congestive heart failure) [facility]) [[id number]]  Admitting Physician: [name] [[id number]]  Attending Physician: [name] [phone]          B Medical/Surgery History Past Medical History:  Diagnosis Date   AICD (automatic cardioverter/defibrillator) present    Anginal pain (HCC)    Anxiety    CAD (coronary artery disease)    CHF (congestive heart failure) (HCC)    Chronic back pain    Colon polyps    Diabetes mellitus    Heart attack (HCC)    Hyperlipidemia    Hypertension    Neuropathy    Panic attacks    PVD (peripheral vascular disease)  (HCC)    Sleep apnea    Past Surgical History:  Procedure Laterality Date   ABDOMINAL AORTOGRAM W/LOWER EXTREMITY N/A 01/17/2021   Procedure: ABDOMINAL AORTOGRAM W/LOWER EXTREMITY;  Surgeon: Nada Libman, MD;  [facility]: [facility];  Service: Cardiovascular;  Laterality: N/A;   ABDOMINAL AORTOGRAM W/LOWER EXTREMITY N/A 02/07/2021   Procedure: ABDOMINAL AORTOGRAM W/LOWER EXTREMITY;  Surgeon: Nada Libman, MD;  [facility]: [facility];  Service: Cardiovascular;  Laterality: N/A;   APPLICATION OF A-CELL OF BACK N/A 12/09/2019   Procedure: APPLICATION OF A-CELL OF BACK;  Surgeon: [name], MD;  Location: [facility] OR;  Service: Thoracic;  Laterality: N/A;   APPLICATION OF A-CELL OF CHEST/ABDOMEN N/A 12/04/2019   Procedure: APPLICATION OF A-CELL OF CHEST;  Surgeon: [name], MD;  Location: [facility] OR;  Service: Thoracic;  Laterality: N/A;   APPLICATION OF WOUND VAC N/A 11/27/2019   Procedure: APPLICATION OF WOUND VAC;  Surgeon: Kerin Perna, MD;  Location: [facility] OR;  Service: Cardiovascular;  Laterality: N/A;  Lower midline superficial sternal wound area.   APPLICATION OF WOUND VAC N/A 12/09/2019   Procedure: WOUND VAC EXCHANGE;  Surgeon: [name], MD;  [facility];  Service: Thoracic;  Laterality: N/A;   APPLICATION OF WOUND VAC N/A 12/14/2019   Procedure: WOUND VAC CHANGE;  Surgeon: [name], MD;  [facility];  Service:  Thoracic;  Laterality: N/A;   CORONARY ANGIOPLASTY WITH STENT PLACEMENT     CORONARY ARTERY BYPASS GRAFT N/A 10/29/2019   Procedure: CORONARY ARTERY BYPASS GRAFTING (CABG) using LIMA to LAD; RIMA to PDA; and Left radial harvest vein to Circ.;  Surgeon: [name], MD;  Location: [facility];  Service: Open Heart Surgery;  Laterality: N/A;   EAR CYST EXCISION  12/10/2011   Procedure: CYST REMOVAL;  Surgeon: Georgia Lopes, DDS;  [facility]: [facility];  Service: Oral Surgery;  Laterality: Left;  Left Mandible Cyst Removal   ICD IMPLANT N/A  10/26/2020   Procedure: ICD IMPLANT;  Surgeon: Marinus Maw, MD;  Location: [facility] INVASIVE CV LAB;  Service: Cardiovascular;  Laterality: N/A;   IR THORACENTESIS ASP PLEURAL SPACE W/IMG GUIDE  10/27/2019   MULTIPLE EXTRACTIONS WITH ALVEOLOPLASTY  12/10/2011   Procedure: MULTIPLE EXTRACION WITH ALVEOLOPLASTY;  Surgeon: Georgia Lopes, DDS;  [facility];  Service: Oral Surgery;  Laterality: N/A;  Right Maxillary Tuberosity Reduction; Right  Maxillary Buccal Exostosis; Bilateral Mandibular Tori    PERIPHERAL VASCULAR BALLOON ANGIOPLASTY Right 02/07/2021   Procedure: PERIPHERAL VASCULAR BALLOON ANGIOPLASTY;  Surgeon: Nada Libman, MD;  [facility];  Service: Cardiovascular;  Laterality: Right;  Superficial femoral artery   PERIPHERAL VASCULAR CATHETERIZATION N/A 12/28/2014   Procedure: Abdominal Aortogram;  Surgeon: Nada Libman, MD;  [facility];  Service: Cardiovascular;  Laterality: N/A;   PERIPHERAL VASCULAR CATHETERIZATION N/A 12/20/2015   Procedure: Abdominal Aortogram;  Surgeon: Nada Libman, MD;  [facility];  Service: Cardiovascular;  Laterality: N/A;   PERIPHERAL VASCULAR CATHETERIZATION  12/20/2015   Procedure: Peripheral Vascular Balloon Angioplasty;  Surgeon: Nada Libman, MD;  [facility];  Service: Cardiovascular;;   PERIPHERAL VASCULAR INTERVENTION Left 01/17/2021   Procedure: PERIPHERAL VASCULAR INTERVENTION;  Surgeon: Nada Libman, MD;  [facility];  Service: Cardiovascular;  Laterality: Left;  Left popiteal and left SFA   RADIAL ARTERY HARVEST Left 10/29/2019   Procedure: RADIAL ARTERY HARVEST;  Surgeon: [name], MD;  [facility];  Service: Open Heart Surgery;  Laterality: Left;   RIGHT/LEFT HEART CATH AND CORONARY ANGIOGRAPHY N/A 10/28/2019   Procedure: RIGHT/LEFT HEART CATH AND CORONARY ANGIOGRAPHY;  Surgeon: Kathleene Hazel, MD;  [facility];   Service: Cardiovascular;  Laterality: N/A;   STERNAL WOUND DEBRIDEMENT N/A 12/01/2019   Procedure: STERNAL WOUND DEBRIDEMENT and WOUND VAC CHANGE;  Surgeon: [name], MD;  [facility];  Service: Thoracic;  Laterality: N/A;   STERNAL WOUND DEBRIDEMENT N/A 12/04/2019   Procedure: STERNAL WOUND DEBRIDEMENT and WOUND VAC CHANGE;  Surgeon: [name], MD;  [facility];  Service: Thoracic;  Laterality: N/A;   STERNAL WOUND DEBRIDEMENT N/A 12/09/2019   Procedure: STERNAL WOUND DEBRIDEMENT;  Surgeon: [name], MD;  [facility];  Service: Thoracic;  Laterality: N/A;   TEE WITHOUT CARDIOVERSION N/A 10/29/2019   Procedure: TRANSESOPHAGEAL ECHOCARDIOGRAM (TEE);  Surgeon: [name], MD;  Location[facility] OR;  Service: Open Heart Surgery;  Laterality: N/A;   TRANSCAROTID ARTERY REVASCULARIZATION  Right 12/01/2020   Procedure: RIGHT TRANSCAROTID ARTERY REVASCULARIZATION;  Surgeon: Nada Libman, MD;  [facility];  Service: Vascular;  Laterality: Right;   TRANSCAROTID ARTERY REVASCULARIZATION  Left 12/30/2020   Procedure: LEFT TRANSCAROTID ARTERY REVASCULARIZATION;  Surgeon: Nada Libman, MD;  [facility];  Service: Vascular;  Laterality: Left;   ULTRASOUND GUIDANCE FOR VASCULAR ACCESS Left 12/01/2020   Procedure: ULTRASOUND GUIDANCE FOR VASCULAR ACCESS;  Surgeon: Nada Libman, MD;  [facility];  Service: Vascular;  Laterality: Left;   ULTRASOUND GUIDANCE FOR VASCULAR ACCESS Right 12/30/2020   Procedure: ULTRASOUND GUIDANCE FOR VASCULAR ACCESS, RIGHT FEMORAL VEIN;  Surgeon: Nada Libman, MD;  [facility];  Service: Vascular;  Laterality: Right;   WOUND EXPLORATION N/A 11/27/2019   Procedure: EXCISIONAL DEBRIDEMENT OF SUPERFICIAL STERNAL WOUND EXPLORATION;  Surgeon: Kerin Perna, MD;  [facility] OR;  Service: Cardiovascular;  Laterality: N/A;  Superficial sternal wound     A IV Location/Drains/Wounds Patient Lines/Drains/Airways Status      Active Line/Drains/Airways     Name Placement date Placement time Site Days   Peripheral IV 10/28/22 20 G Right Antecubital 10/28/22  0946  Antecubital  less than 1            Intake/Output Last 24 hours  Intake/Output Summary (Last 24 hours) at 10/28/2022 1329 Last data filed at 10/28/2022 1241 Gross per 24 hour  Intake --  Output 950 ml  Net -950 ml    Labs/Imaging Results for orders placed or performed during the hospital encounter of 10/28/22 (from the past 48 hour(s))  Basic metabolic panel     Status: Abnormal   Collection Time: 10/28/22  9:23 AM  Result Value Ref Range   Sodium 140 135 - 145 mmol/L   Potassium 3.9 3.5 - 5.1 mmol/L   Chloride 97 (L) 98 - 111 mmol/L   CO2 29 22 - 32 mmol/L   Glucose, Bld 103 (H) 70 - 99 mg/dL    Comment: Glucose reference range applies only to samples taken after fasting for at least 8 hours.   BUN 13 6 - 20 mg/dL   Creatinine, Ser 9.60 0.61 - 1.24 mg/dL   Calcium 8.9 8.9 - 45.4 mg/dL   GFR, Estimated >09 >81 mL/min    Comment: (NOTE) Calculated using the CKD-EPI Creatinine Equation (2021)    Anion gap 14 5 - 15    Comment: Performed at [facility], [address], [place], [place] [address]  Magnesium     Status: None   Collection Time: 10/28/22  9:23 AM  Result Value Ref Range   Magnesium 2.1 1.7 - 2.4 mg/dL    Comment: Performed at [facility], [address] [place], [place] [address]  Brain natriuretic peptide (order ONLY if patient c/o SOB)     Status: Abnormal   Collection Time: 10/28/22  9:23 AM  Result Value Ref Range   B Natriuretic Peptide 796.1 (H) 0.0 - 100.0 pg/mL    Comment: Performed at [facility], [address], [place], [place] [address]  CBC     Status: Abnormal   Collection Time: 10/28/22  9:23 AM  Result Value Ref Range   WBC 9.8 4.0 - 10.5 K/uL   RBC 4.92 4.22 - 5.81 MIL/uL   Hemoglobin 14.2 13.0 - 17.0 g/dL   HCT 65.7 84.6 - 96.2 %   MCV 92.3 80.0 - 100.0 fL   MCH 28.9 26.0 -  34.0 pg   MCHC 31.3 30.0 - 36.0 g/dL   RDW 95.2 (H) 84.1 - 32.4 %   Platelets 287 150 - 400 K/uL   nRBC 0.0 0.0 - 0.2 %    Comment: Performed at [facility], [address] [place], [place] [address]  Troponin I (  High Sensitivity)     Status: Abnormal   Collection Time: 10/28/22  9:23 AM  Result Value Ref Range   Troponin I (High Sensitivity) 36 (H) <18 ng/L    Comment: (NOTE) Elevated high sensitivity troponin I (hsTnI) values and significant  changes across serial measurements may suggest ACS but many other  chronic and acute conditions are known to elevate hsTnI results.  Refer to the "Links" section for chest pain algorithms and additional  guidance. Performed at [facility], [address] [place], [place] [address]    UE VENOUS DUPLEX (7am - 7pm)  Result Date: 10/28/2022 UPPER VENOUS STUDY  Patient Name:  [name]  Date of Exam:   10/28/2022 Medical Rec #: [id number]          Accession #:    [id number] Date of Birth: Sep 23, 1960          Patient Gender: M Patient Age:   23 years Exam Location:  [facility] Procedure:      VAS [place] UPPER EXTREMITY VENOUS DUPLEX Referring Phys: JON KNAPP --------------------------------------------------------------------------------  Indications: fluid build up, CHF, SOB Limitations: Body habitus. Comparison Study: No previous study. Performing Technologist: McKayla Maag RVT, [place]  Examination Guidelines: A complete evaluation includes B-mode imaging, spectral Doppler, color Doppler, and power Doppler as needed of all accessible portions of each vessel. Bilateral testing is considered an integral part of a complete examination. Limited examinations for reoccurring indications may be performed as noted.  Right Findings: +----------+------------+---------+-----------+----------+-------+ RIGHT     CompressiblePhasicitySpontaneousPropertiesSummary +----------+------------+---------+-----------+----------+-------+ Subclavian     Full       Yes       Yes                      +----------+------------+---------+-----------+----------+-------+  Left Findings: +----------+------------+---------+-----------+----------+--------------+ LEFT      CompressiblePhasicitySpontaneousProperties   Summary     +----------+------------+---------+-----------+----------+--------------+ IJV           Full       Yes       Yes                             +----------+------------+---------+-----------+----------+--------------+ Subclavian    Full       Yes       Yes                             +----------+------------+---------+-----------+----------+--------------+ Axillary      Full       Yes       Yes                             +----------+------------+---------+-----------+----------+--------------+ Brachial      Full       Yes       Yes                             +----------+------------+---------+-----------+----------+--------------+ Radial                                              Not visualized +----------+------------+---------+-----------+----------+--------------+ Ulnar         Full                                                 +----------+------------+---------+-----------+----------+--------------+  Cephalic      Full                                                 +----------+------------+---------+-----------+----------+--------------+ Basilic       Full                                                 +----------+------------+---------+-----------+----------+--------------+  Summary:  Right: No evidence of thrombosis in the subclavian.  Left: No evidence of deep vein thrombosis in the upper extremity. No evidence of superficial vein thrombosis in the upper extremity.  *See table(s) above for measurements and observations.  Diagnosing physician: Lemar Livings MD Electronically signed by Lemar Livings MD on 10/28/2022 at 10:42:10 AM.    Final    DG Chest Portable 1 View  Result  Date: 10/28/2022 CLINICAL DATA:  Dyspnea EXAM: PORTABLE CHEST 1 VIEW COMPARISON:  None Available. FINDINGS: Sternotomy wires overlie enlarged cardiac silhouette. There is LEFT basilar density increased from comparison exam. Increase in small RIGHT effusion additionally. There is central venous congestion which is increased. No pneumothorax. IMPRESSION: 1. Central venous congestion and bilateral pleural effusions. 2. LEFT basilar density suggests atelectasis or pneumonia. Electronically Signed   By: Genevive Bi M.D.   On: 10/28/2022 09:56    Pending Labs Unresulted Labs (From admission, onward)     Start     Ordered   10/29/22 0500  HIV Antibody (routine testing w rflx)  (HIV Antibody (Routine testing w reflex) panel)  Tomorrow morning,   R        10/28/22 1229   10/29/22 0500  Basic metabolic panel  Tomorrow morning,   R        10/28/22 1229   10/29/22 0500  CBC  Tomorrow morning,   R        10/28/22 1229            Vitals/Pain Today's Vitals   10/28/22 0947 10/28/22 1100 10/28/22 1117 10/28/22 1145  BP:  (!) 153/77 (!) 156/89 (!) 149/78  Pulse:  (!) 36 64 (!) 58  Resp:  20 20 (!) 21  Temp:      TempSrc:      SpO2:  98% 99% 96%  Weight:      Height:      PainSc: 8        Isolation Precautions No active isolations  Medications Medications  nitroGLYCERIN (NITROGLYN) 2 % ointment 1 inch (1 inch Topical Given 10/28/22 1141)  acetaminophen (TYLENOL) tablet 650 mg (has no administration in time range)    Or  acetaminophen (TYLENOL) suppository 650 mg (has no administration in time range)  furosemide (LASIX) injection 40 mg (40 mg Intravenous Given 10/28/22 0952)  potassium chloride SA (KLOR-CON M) CR tablet 20 mEq (20 mEq Oral Given 10/28/22 0951)  acetaminophen (TYLENOL) tablet 650 mg (650 mg Oral Given 10/28/22 1113)  furosemide (LASIX) injection 40 mg (40 mg Intravenous Given 10/28/22 1115)    Mobility walks     Focused Assessments Cardiac Assessment Handoff:  Cardiac  Rhythm: Atrial fibrillation Lab Results  Component Value Date   CKTOTAL 46 01/30/2011   CKMB 2.4 01/30/2011   TROPONINI <0.30 01/30/2011   Lab Results  Component Value  Date   DDIMER 0.92 (H) 06/03/2020   Does the Patient currently have chest pain? No    R Recommendations: See Admitting Provider Note  Report given to:   Additional Notes:

## 2022-10-28 NOTE — H&P (Addendum)
Date: 10/28/2022               Patient Name:  [name] MRN: [id number]  DOB: Aug 03, 1960 Age / Sex: 62 y.o., male   PCP: Default, Provider, MD         Medical Service: Internal Medicine Teaching Service         Attending Physician: Dr. Oswaldo Done, Marquita Palms, *      First Contact: Dr. Annett Fabian, MD Pager [phone]    Second Contact: Dr. Elza Rafter, DO Pager [phone]         After Hours (After 5p/  First Contact Pager: [phone]  weekends / holidays): Second Contact Pager: [phone]   SUBJECTIVE   Chief Complaint: Shortness of breath  History of Present Illness: [name] is a 62 y.o. male with a PMH of coronary artery disease s/p CABG and ICD placement, hyperlipidemia, Type 2 diabetes, HFrEF (25-30%), carotid artery stenosis s/p endarterectomy, PAD s/p distal right superficial femoral and proximal popliteal artery stents; right common iliac artery stent; and drug-coated balloon angioplasty, who presents with 4-5 days of progressively worsening shortness of breath. He notes that it is worse when laying flat or with exertion. He has baseline orthopnea and usually uses 2 pillows, but recently has been sleeping with 5 in the past week.    He says he spoke with Dr. Jens Som, his cardiologist, about his recent shortness of breath; he was told to double up on his Lasix for a couple of days, but this did not improve his symptoms, so he came to the emergency department. He wears 3 L [place] at bedtime and usually 1 hour during the day when he experiences shortness of breath.  He notes that this past week he has been eating worse food than normal, as his son is home from college and they have been eating out a lot.  He also says he was not taking Coreg for a month or so, but restarted one week ago.  He is very adherent with the rest of his medications other than an occasional missed dose here or there in the evening.  He has not had any chest pain, fever, or chills. He reports some  chronic lower back pain but denies any urinary incontinence or numbness/tingling in his legs.  He reports chronic back pain, but feels it is lower than usual this time. 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  Meds:  Lipitor 80 mg daily Coreg 25 mg BID Plavix 75 mg daily Dulaglutide 1.5 mg weekly Eliquis 5 mg BID Jardiance 25 mg daily Zetia 10 mg daily  Lasix 40 mg daily Metformin 1000 mg BID Entresto 97-103 mg BID Spironolactone 12.5 mg daily Tresiba 50u daily (morning)  Past Medical History: Type 2 diabetes ICD (after CABG on 10/29/2019) Peripheral artery disease, s/p distal right superficial femoral and proximal popliteal artery stents; right common iliac artery stent; drug-coated balloon angioplasty; carotid endarterectomy Hypertension Hyperlipidemia Atrial fibrillation  Past Surgical History:  Procedure Laterality Date   ABDOMINAL AORTOGRAM W/LOWER EXTREMITY N/A 01/17/2021   Procedure: ABDOMINAL AORTOGRAM W/LOWER EXTREMITY;  Surgeon: Nada Libman, MD;  Location: [facility];  Service: Cardiovascular;  Laterality: N/A;   ABDOMINAL AORTOGRAM W/LOWER EXTREMITY N/A 02/07/2021   Procedure: ABDOMINAL AORTOGRAM W/LOWER EXTREMITY;  Surgeon: Nada Libman, MD;  Location: [facility];  Service: Cardiovascular;  Laterality: N/A;   APPLICATION OF A-CELL OF BACK  N/A 12/09/2019   Procedure: APPLICATION OF A-CELL OF BACK;  Surgeon: [name], MD;  Location: [facility] OR;  Service: Thoracic;  Laterality: N/A;   APPLICATION OF A-CELL OF CHEST/ABDOMEN N/A 12/04/2019   Procedure: APPLICATION OF A-CELL OF CHEST;  Surgeon: [name], MD;  Location: [facility];  Service: Thoracic;  Laterality: N/A;   APPLICATION OF WOUND VAC N/A 11/27/2019   Procedure: APPLICATION OF WOUND VAC;  Surgeon: Kerin Perna, MD;  Location: [facility];  Service: Cardiovascular;  Laterality: N/A;  Lower midline superficial  sternal wound area.   APPLICATION OF WOUND VAC N/A 12/09/2019   Procedure: WOUND VAC EXCHANGE;  Surgeon: [name], MD;  Location: [facility] OR;  Service: Thoracic;  Laterality: N/A;   APPLICATION OF WOUND VAC N/A 12/14/2019   Procedure: WOUND VAC CHANGE;  Surgeon: [name], MD;  Location: [facility];  Service: Thoracic;  Laterality: N/A;   CORONARY ANGIOPLASTY WITH STENT PLACEMENT     CORONARY ARTERY BYPASS GRAFT N/A 10/29/2019   Procedure: CORONARY ARTERY BYPASS GRAFTING (CABG) using LIMA to LAD; RIMA to PDA; and Left radial harvest vein to Circ.;  Surgeon: [name], MD;  Location: [facility];  Service: Open Heart Surgery;  Laterality: N/A;   EAR CYST EXCISION  12/10/2011   Procedure: CYST REMOVAL;  Surgeon: Georgia Lopes, DDS;  Location: [facility];  Service: Oral Surgery;  Laterality: Left;  Left Mandible Cyst Removal   ICD IMPLANT N/A 10/26/2020   Procedure: ICD IMPLANT;  Surgeon: Marinus Maw, MD;  Location: [facility];  Service: Cardiovascular;  Laterality: N/A;   IR THORACENTESIS ASP PLEURAL SPACE W/IMG GUIDE  10/27/2019   MULTIPLE EXTRACTIONS WITH ALVEOLOPLASTY  12/10/2011   Procedure: MULTIPLE EXTRACION WITH ALVEOLOPLASTY;  Surgeon: Georgia Lopes, DDS;  Location: [facility];  Service: Oral Surgery;  Laterality: N/A;  Right Maxillary Tuberosity Reduction; Right  Maxillary Buccal Exostosis; Bilateral Mandibular Tori    PERIPHERAL VASCULAR BALLOON ANGIOPLASTY Right 02/07/2021   Procedure: PERIPHERAL VASCULAR BALLOON ANGIOPLASTY;  Surgeon: Nada Libman, MD;  [facility]: [facility];  Service: Cardiovascular;  Laterality: Right;  Superficial femoral artery   PERIPHERAL VASCULAR CATHETERIZATION N/A 12/28/2014   Procedure: Abdominal Aortogram;  Surgeon: Nada Libman, MD;  [facility]: [facility];  Service: Cardiovascular;  Laterality: N/A;   PERIPHERAL VASCULAR CATHETERIZATION N/A 12/20/2015   Procedure: Abdominal Aortogram;  Surgeon: Nada Libman, MD;  [facility];  Service: Cardiovascular;  Laterality: N/A;   PERIPHERAL VASCULAR CATHETERIZATION  12/20/2015   Procedure: Peripheral Vascular Balloon Angioplasty;  Surgeon: Nada Libman, MD;  [facility]: [facility];  Service: Cardiovascular;;   PERIPHERAL VASCULAR INTERVENTION Left 01/17/2021   Procedure: PERIPHERAL VASCULAR INTERVENTION;  Surgeon: Nada Libman, MD;  [facility]: [facility];  Service: Cardiovascular;  Laterality: Left;  Left popiteal and left SFA   RADIAL ARTERY HARVEST Left 10/29/2019   Procedure: RADIAL ARTERY HARVEST;  Surgeon: [name], MD;  [facility]: [facility];  Service: Open Heart Surgery;  Laterality: Left;   RIGHT/LEFT HEART CATH AND CORONARY ANGIOGRAPHY N/A 10/28/2019   Procedure: RIGHT/LEFT HEART CATH AND CORONARY ANGIOGRAPHY;  Surgeon: Kathleene Hazel, MD;  [facility]: [facility];  Service: Cardiovascular;  Laterality: N/A;   STERNAL WOUND DEBRIDEMENT N/A 12/01/2019   Procedure: STERNAL WOUND DEBRIDEMENT and WOUND VAC CHANGE;  Surgeon: [name], MD;  [facility];  Service: Thoracic;  Laterality: N/A;  STERNAL WOUND DEBRIDEMENT N/A 12/04/2019   Procedure: STERNAL WOUND DEBRIDEMENT and WOUND VAC CHANGE;  Surgeon: [name], MD;  [facility]: [facility];  Service: Thoracic;  Laterality: N/A;   STERNAL WOUND DEBRIDEMENT N/A 12/09/2019   Procedure: STERNAL WOUND DEBRIDEMENT;  Surgeon: [name], MD;  [facility];  Service: Thoracic;  Laterality: N/A;   TEE WITHOUT CARDIOVERSION N/A 10/29/2019   Procedure: TRANSESOPHAGEAL ECHOCARDIOGRAM (TEE);  Surgeon: [name], MD;  Location[facility] OR;  Service: Open Heart Surgery;  Laterality: N/A;   TRANSCAROTID ARTERY REVASCULARIZATION  Right 12/01/2020   Procedure: RIGHT TRANSCAROTID ARTERY REVASCULARIZATION;  Surgeon: Nada Libman, MD;  [facility];  Service: Vascular;  Laterality: Right;   TRANSCAROTID ARTERY REVASCULARIZATION  Left 12/30/2020   Procedure: LEFT  TRANSCAROTID ARTERY REVASCULARIZATION;  Surgeon: Nada Libman, MD;  Location: [facility];  Service: Vascular;  Laterality: Left;   ULTRASOUND GUIDANCE FOR VASCULAR ACCESS Left 12/01/2020   Procedure: ULTRASOUND GUIDANCE FOR VASCULAR ACCESS;  Surgeon: Nada Libman, MD;  [facility];  Service: Vascular;  Laterality: Left;   ULTRASOUND GUIDANCE FOR VASCULAR ACCESS Right 12/30/2020   Procedure: ULTRASOUND GUIDANCE FOR VASCULAR ACCESS, RIGHT FEMORAL VEIN;  Surgeon: Nada Libman, MD;  Location: [facility];  Service: Vascular;  Laterality: Right;   WOUND EXPLORATION N/A 11/27/2019   Procedure: EXCISIONAL DEBRIDEMENT OF SUPERFICIAL STERNAL WOUND EXPLORATION;  Surgeon: Kerin Perna, MD;  Location: [facility] OR;  Service: Cardiovascular;  Laterality: N/A;  Superficial sternal wound   Social:  Lives With: Wife  Occupation: on disability 5-6 years. 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 Eden Emms with [facility] ([place]); Dr. Jens Som is cardiologist. Substances:  1 pack/day since age 75 (quit a few years ago); no alcohol (quit drinking 10 years ago); no other substance use  Family History:  Father died of cardiovascular disease at 68 Both parents had Type 2 Diabetes  Allergies: Allergies as of 10/28/2022 - Review Complete 10/28/2022  Allergen Reaction Noted   Codeine Itching and Shortness Of Breath 11/21/2012   Gabapentin Other (See Comments) 11/21/2012   Pregabalin Other (See Comments) 02/25/2014   Simvastatin Other (See Comments) 12/09/2012   Lisinopril Itching and Rash 01/30/2011   Morphine Rash 06/26/2022   Venlafaxine Rash 02/25/2014   Review of Systems: A complete ROS was negative except as per HPI.   OBJECTIVE:   Physical Exam: Blood pressure (!) 149/78, pulse (!) 58, temperature (!) 97.5 F (36.4 C), temperature source Oral, resp. rate (!) 21, height 5\' 7"   (1.702 m), weight 105.2 kg, SpO2 96 %.  Constitutional: Laying in bed, some discomfort noted HENT: normocephalic atraumatic, mucous membranes moist Eyes: conjunctiva non-erythematous Neck: supple Cardiovascular: regular rate and rhythm, distant heart sounds; 2+ pitting edema noted in the left upper and lower extremity; 1+ edema on the right; pulses intact on left upper extremity Pulmonary/Chest: Some increased work of breathing on 3L [place] with accessory muscle usage; mild crackles in the bases bilaterally. Abdominal: soft, non-tender, some distension MSK: No tenderness to palpation along spinous processes; tenderness to palpation of right lower paraspinal and flank muscles Neurological: alert & oriented x 3, 5/5 strength in bilateral upper and lower extremities Skin: warm and dry Psych: Normal mood and affect  Labs:  CBC    Component Value Date/Time   WBC 9.8 10/28/2022 0923   RBC 4.92 10/28/2022 0923  HGB 14.2 10/28/2022 0923   HGB 15.6 10/19/2020 1031   HCT 45.4 10/28/2022 0923   HCT 47.6 10/19/2020 1031   PLT 287 10/28/2022 0923   PLT 242 10/19/2020 1031   MCV 92.3 10/28/2022 0923   MCV 92 10/19/2020 1031   MCH 28.9 10/28/2022 0923   MCHC 31.3 10/28/2022 0923   RDW 16.6 (H) 10/28/2022 0923   RDW 17.1 (H) 10/19/2020 1031   LYMPHSABS 1.7 10/19/2020 1031   MONOABS 1.0 06/03/2020 2200   EOSABS 0.2 10/19/2020 1031   BASOSABS 0.1 10/19/2020 1031    CMP     Component Value Date/Time   NA 140 10/28/2022 0923   NA 141 01/29/2022 0945   K 3.9 10/28/2022 0923   CL 97 (L) 10/28/2022 0923   CO2 29 10/28/2022 0923   GLUCOSE 103 (H) 10/28/2022 0923   BUN 13 10/28/2022 0923   BUN 25 (H) 01/29/2022 0945   CREATININE 1.18 10/28/2022 0923   CALCIUM 8.9 10/28/2022 0923   PROT 7.1 03/23/2022 0832   ALBUMIN 4.5 03/23/2022 0832   AST 29 03/23/2022 0832   ALT 35 03/23/2022 0832   ALKPHOS 94 03/23/2022 0832   BILITOT 0.5 03/23/2022 0832   GFRNONAA >60 10/28/2022 0923   GFRAA >60  12/09/2019 0608    BNP: elevated at 796.1.   Troponin x 2: 36, 29  Imaging:  EKG: personally reviewed my interpretation is no apparent ST changes, QTc prolonged at 456.   Chest x-ray: central venous congestion and bilateral pleural effusions, as well as a left basilar density suggesting atelectasis or pneumonia.   Doppler, RUE: No evidence of DVT in the upper left extremity  ASSESSMENT & PLAN:   Assessment & Plan by Problem: Principal Problem:   Acute exacerbation of CHF (congestive heart failure) (HCC)  [name] is a 62 y.o. person living with a PMH of PAD s/p popliteal and superficial femoral stents, hyperlipidemia, diabetes, HFrEF (25-30%), carotid artery stenosis s/p endarterectomy, and MI s/p ICD who presented with shortness of breath and swelling and was admitted for acute CHF exacerbation on hospital day 0.   Acute CHF exacerbation HFrEF (25-30% per echo in 2022) Coronary artery disease s/p CABG (2021) and ICD placement  Atrial fibrillation Patient has a history of HFrEF and follows with Dr. Jens Som, his cardiologist, outpatient.  His shortness of breath, edema, and orthopnea are consistent with an acute CHF exacerbation.  This is supported by elevated BNP, bilateral pulmonary edema on CXR, and bibasilar crackles on exam.  He received 80 mg IV Lasix in the ED.  Currently on 3 L of [place], which is his rate at home.  At home he takes Coreg 25 mg twice daily, and Lasix 40 mg daily, Entresto 97-103 mg BID, and Spironolactone 12.5 mg daily.  For his Afib, he is currently taking Eliquis 5 mg twice daily. -Echocardiogram -CMP, CBC -Strict I's/O's -Trend troponin -Continue spironolactone 12.5 mg daily -Lasix 40 IV tomorrow morning if diuresis needed -Hold Coreg for now; will restart after echo -Continue Eliquis 5 mg twice daily  Type 2 diabetes, poorly controlled Last hemoglobin A1c 8.1 in 10/23.  Patient is following out patient with endocrinology.  Current regimen  includes dulaglutide 1.5 mg weekly, Jardiance 25 mg daily, metformin 1000 mg twice daily, and Tresiba 50 units daily. -Tresiba 50 units daily in the morning, SSI with meals -Continue Jardiance 25 mg daily  Hypertension Hyperlipidemia Peripheral artery disease Patient is s/p distal right superficial femoral and proximal popliteal artery stents;  right common iliac artery stent; drug-coated balloon angioplasty; and carotid endarterectomy.  His blood pressure is 149/78 this morning.  His home hypertension medications include Coreg 25 mg twice daily and Entresto 97-103 mg BID.  He is taking Lipitor 80 mg daily and Plavix 75 mg daily for his hyperlipidemia/PAD. -Continue Entresto 97-103 mg BID -Continue Plavix 75 mg daily, Lipitor 80 mg daily  Diet: Heart Healthy VTE: Eliquis 5 mg BID IVF: None Code: Full  Prior to Admission Living Arrangement: Home Anticipated Discharge Location: Home Barriers to Discharge: Clinical status  Dispo: Admit patient to Observation with expected length of stay less than 2 midnights.  Signed: Annett Fabian, MD Internal Medicine Resident PGY-1  10/28/2022, 3:32 PM

## 2022-10-28 NOTE — Progress Notes (Signed)
Contacted Dr. Ned Card to request order for cardiac telemetry. MD responded that cardiac telemetry is not needed for this patient. Clarified that patient is admitted for CHF. MD responded that no order is needed and patient does not need to be on telemetry monitoring.

## 2022-10-28 NOTE — ED Notes (Signed)
Nitroglycerin paste removed per admitting

## 2022-10-28 NOTE — Progress Notes (Signed)
Right upper extremity venous study completed.   Preliminary results relayed to MD in ER.  Please see CV Procedures for preliminary results.  Taiven Greenley, RVT  10:25 AM 10/28/22

## 2022-10-28 NOTE — Hospital Course (Addendum)
[name] is a 62 y.o. male with a past medical history of coronary artery disease, hyperlipidemia, diabetes, CHF, pleural effusions, carotid artery stenosis, ICD.  Presented with progressive shortness of breath, which began 4 to 5 days ago.  Follows outpatient with cardiology.  It is worse when he is lying flat or with exertion.  Also reports feeling like he is "filled with fluid".  Left arm swelling greater than right.  Tachypnea, Rales, and edema present on exam.  CMP unremarkable, BNP elevated at 796.1.  Troponin 36.  CBC unremarkable.  Chest x-ray showed central venous congestion and bilateral pleural effusions, as well as a left basilar density suggesting atelectasis or pneumonia. Admitted for CHF exacerbation.   Asymmetric swelling up LUE --> Doppler negative  7/8: Rested well last night; able to sleep supine w/o complications  7/9: edema near knees which are more firm; patient states he feels better; resting comfortably in bed; bedside ultrasound performed; JVD ~4-5 cm

## 2022-10-28 NOTE — ED Notes (Signed)
Pt to US.

## 2022-10-28 NOTE — ED Notes (Addendum)
Pt asking for something for back pain, MD at bedside

## 2022-10-28 NOTE — ED Triage Notes (Signed)
PT complains of fluid build up in stomach and left arm from CHF x 4 days. Also complains of back pain. Pt SOB worse with exertion.

## 2022-10-28 NOTE — ED Provider Notes (Signed)
[facility] EMERGENCY DEPARTMENT AT [facility] Provider Note   CSN: [id number] Arrival date & time: 10/28/22  0901     History  Chief complaint: Shortness of breath  [name] is a 62 y.o. male.  Differential diagnosis includes but not limited to CHF, pneumonia, PE, Pneumothorax      Patient has a history of coronary artery disease, hyperlipidemia, diabetes, CHF, pleural effusions, carotid artery stenosis, ICD device.  Patient presents with complaints of shortness of breath.  Symptoms started about 4 to 5 days ago.  He thought he had an appointment with his cardiologist on Friday and tried to be seen in the office but there was a mistake with scheduling.  They suggested he go to the ED.  Patient ended up coming to the emergency room today.  Patient states he been having more shortness of breath.  He gets worse when he is lying flat or any activity.  He feels like he is filled with fluid.  Patient also has noticed more swelling in his left arm compared to his right.  He denies any history of DVT or PE.  No fevers or chills.  Home Medications Prior to Admission medications   Medication Sig Start Date End Date Taking? Authorizing Provider  ACCU-CHEK GUIDE test strip  03/10/21   [provider]  atorvastatin (LIPITOR) 80 MG tablet Take 1 tablet (80 mg total) by mouth daily. 02/21/22   Lewayne Bunting, MD  carvedilol (COREG) 25 MG tablet Take 1 tablet (25 mg total) by mouth 2 (two) times daily with a meal. 10/16/22   Crenshaw, Madolyn Frieze, MD  clopidogrel (PLAVIX) 75 MG tablet Take 1 tablet (75 mg total) by mouth daily. 01/26/22   Nada Libman, MD  Dulaglutide 1.5 MG/0.5ML SOPN Inject 1.5 mg into the skin once a week.    [provider]  ELIQUIS 5 MG TABS tablet TAKE 1 TABLET BY MOUTH TWICE DAILY 07/24/22   Lewayne Bunting, MD  empagliflozin (JARDIANCE) 25 MG TABS tablet Take 1 tablet (25 mg total) by mouth daily before breakfast. 07/19/20   Ronney Asters,  NP  ezetimibe (ZETIA) 10 MG tablet Take 1 tablet (10 mg total) by mouth daily. Patient not taking: Reported on 06/26/2022 12/02/20 06/26/22  Valetta Fuller, Lynnell Jude, PA-C  furosemide (LASIX) 40 MG tablet Take 1 tablet (40 mg total) by mouth daily. 06/14/22   Cannon Kettle, PA-C  metFORMIN (GLUCOPHAGE) 1000 MG tablet Take 1,000 mg by mouth 2 (two) times daily. 06/21/20   [provider]  OXYGEN Inhale 3 L/min into the lungs at bedtime as needed (for shortness of breath).    [provider]  sacubitril-valsartan (ENTRESTO) 97-103 MG Take 1 tablet by mouth 2 (two) times daily. 08/19/20   Lewayne Bunting, MD  spironolactone (ALDACTONE) 25 MG tablet Take 0.5 tablets (12.5 mg total) by mouth daily. 01/19/22   Lewayne Bunting, MD  TRESIBA FLEXTOUCH 100 UNIT/ML FlexTouch Pen Inject 30 Units into the skin daily. 08/17/20   [provider]      Allergies    Codeine, Gabapentin, Pregabalin, Simvastatin, Lisinopril, Morphine, and Venlafaxine    Review of Systems   Review of Systems  Physical Exam Updated Vital Signs BP (!) 153/77   Pulse (!) 36   Temp (!) 97.5 F (36.4 C) (Oral)   Resp 20   Ht 1.702 m (5\' 7" )   Wt 105.2 kg   SpO2 98%   BMI 36.34 kg/m  Physical Exam Vitals and nursing note reviewed.  Constitutional:      Appearance: He is well-developed. He is ill-appearing.  HENT:     Head: Normocephalic and atraumatic.     Right Ear: External ear normal.     Left Ear: External ear normal.  Eyes:     General: No scleral icterus.       Right eye: No discharge.        Left eye: No discharge.     Conjunctiva/sclera: Conjunctivae normal.  Neck:     Trachea: No tracheal deviation.  Cardiovascular:     Rate and Rhythm: Normal rate and regular rhythm.  Pulmonary:     Effort: Tachypnea and accessory muscle usage present.     Breath sounds: No stridor. Rales present. No wheezing.  Abdominal:     General: Bowel sounds are normal. There is no distension.     Palpations:  Abdomen is soft.     Tenderness: There is no abdominal tenderness. There is no guarding or rebound.  Musculoskeletal:        General: No tenderness or deformity.     Cervical back: Neck supple.     Right lower leg: Edema present.     Left lower leg: Edema present.     Comments: Asymmetric left upper extremity edema compared to right, surgical scar noted on left forearm  Skin:    General: Skin is warm and dry.     Findings: No rash.  Neurological:     General: No focal deficit present.     Mental Status: He is alert.     Cranial Nerves: No cranial nerve deficit, dysarthria or facial asymmetry.     Sensory: No sensory deficit.     Motor: No abnormal muscle tone or seizure activity.     Coordination: Coordination normal.  Psychiatric:        Mood and Affect: Mood normal.     ED Results / Procedures / Treatments   Labs (all labs ordered are listed, but only abnormal results are displayed) Labs Reviewed  BASIC METABOLIC PANEL - Abnormal; Notable for the following components:      Result Value   Chloride 97 (*)    Glucose, Bld 103 (*)    All other components within normal limits  BRAIN NATRIURETIC PEPTIDE - Abnormal; Notable for the following components:   B Natriuretic Peptide 796.1 (*)    All other components within normal limits  CBC - Abnormal; Notable for the following components:   RDW 16.6 (*)    All other components within normal limits  TROPONIN I (HIGH SENSITIVITY) - Abnormal; Notable for the following components:   Troponin I (High Sensitivity) 36 (*)    All other components within normal limits  MAGNESIUM    EKG EKG Interpretation Date/Time:  Sunday October 28 2022 09:23:21 EDT Ventricular Rate:  109 PR Interval:    QRS Duration:  105 QT Interval:  456 QTC Calculation: 586 R Axis:   170  Text Interpretation: poor data quality, undetermined rhtyhm Paired ventricular premature complexes Anterolateral infarct, old Prolonged QT interval Confirmed by Linwood Dibbles  [phone]) on 10/28/2022 10:58:02 AM  Radiology UE VENOUS DUPLEX (7am - 7pm)  Result Date: 10/28/2022 UPPER VENOUS STUDY  Patient Name:  [name]  Date of Exam:   10/28/2022 Medical Rec #: [id number]          Accession #:    [id number] Date of Birth: 02-19-1961  Patient Gender: M Patient Age:   80 years Exam Location:  [facility] Procedure:      VAS [place] UPPER EXTREMITY VENOUS DUPLEX Referring Phys: Torrian Canion --------------------------------------------------------------------------------  Indications: fluid build up, CHF, SOB Limitations: Body habitus. Comparison Study: No previous study. Performing Technologist: McKayla Maag RVT, VT  Examination Guidelines: A complete evaluation includes B-mode imaging, spectral Doppler, color Doppler, and power Doppler as needed of all accessible portions of each vessel. Bilateral testing is considered an integral part of a complete examination. Limited examinations for reoccurring indications may be performed as noted.  Right Findings: +----------+------------+---------+-----------+----------+-------+ RIGHT     CompressiblePhasicitySpontaneousPropertiesSummary +----------+------------+---------+-----------+----------+-------+ Subclavian    Full       Yes       Yes                      +----------+------------+---------+-----------+----------+-------+  Left Findings: +----------+------------+---------+-----------+----------+--------------+ LEFT      CompressiblePhasicitySpontaneousProperties   Summary     +----------+------------+---------+-----------+----------+--------------+ IJV           Full       Yes       Yes                             +----------+------------+---------+-----------+----------+--------------+ Subclavian    Full       Yes       Yes                             +----------+------------+---------+-----------+----------+--------------+ Axillary      Full       Yes       Yes                              +----------+------------+---------+-----------+----------+--------------+ Brachial      Full       Yes       Yes                             +----------+------------+---------+-----------+----------+--------------+ Radial                                              Not visualized +----------+------------+---------+-----------+----------+--------------+ Ulnar         Full                                                 +----------+------------+---------+-----------+----------+--------------+ Cephalic      Full                                                 +----------+------------+---------+-----------+----------+--------------+ Basilic       Full                                                 +----------+------------+---------+-----------+----------+--------------+  Summary:  Right: No  evidence of thrombosis in the subclavian.  Left: No evidence of deep vein thrombosis in the upper extremity. No evidence of superficial vein thrombosis in the upper extremity.  *See table(s) above for measurements and observations.  Diagnosing physician: Lemar Livings MD Electronically signed by Lemar Livings MD on 10/28/2022 at 10:42:10 AM.    Final    DG Chest Portable 1 View  Result Date: 10/28/2022 CLINICAL DATA:  Dyspnea EXAM: PORTABLE CHEST 1 VIEW COMPARISON:  None Available. FINDINGS: Sternotomy wires overlie enlarged cardiac silhouette. There is LEFT basilar density increased from comparison exam. Increase in small RIGHT effusion additionally. There is central venous congestion which is increased. No pneumothorax. IMPRESSION: 1. Central venous congestion and bilateral pleural effusions. 2. LEFT basilar density suggests atelectasis or pneumonia. Electronically Signed   By: Genevive Bi M.D.   On: 10/28/2022 09:56    Procedures .Critical Care  Performed by: Linwood Dibbles, MD Authorized by: Linwood Dibbles, MD   Critical care provider statement:    Critical care time (minutes):  30    Critical care was time spent personally by me on the following activities:  Development of treatment plan with patient or surrogate, discussions with consultants, evaluation of patient's response to treatment, examination of patient, ordering and review of laboratory studies, ordering and review of radiographic studies, ordering and performing treatments and interventions, pulse oximetry, re-evaluation of patient's condition and review of old charts     Medications Ordered in ED Medications  nitroGLYCERIN (NITROGLYN) 2 % ointment 1 inch (has no administration in time range)  acetaminophen (TYLENOL) tablet 650 mg (has no administration in time range)  furosemide (LASIX) injection 40 mg (has no administration in time range)  furosemide (LASIX) injection 40 mg (40 mg Intravenous Given 10/28/22 0952)  potassium chloride SA (KLOR-CON M) CR tablet 20 mEq (20 mEq Oral Given 10/28/22 0951)    ED Course/ Medical Decision Making/ A&P Clinical Course as of 10/28/22 1105  Sun Oct 28, 2022  1006 CBC(!) CBC normal. [JK]  1025 Doppler study negative for DVT [JK]  1055 Troponin increased to 36, however this decreased compared to previous values.  BNP is elevated. [JK]  1055 Chest x-ray shows central venous congestion.  Possible atelectasis or pneumonia, favor the former [JK]    Clinical Course User Index [JK] Linwood Dibbles, MD                             Medical Decision Making Problems Addressed: Acute on chronic congestive heart failure, unspecified heart failure type [facility]): acute illness or injury that poses a threat to life or bodily functions  Amount and/or Complexity of Data Reviewed Labs: ordered. Decision-making details documented in ED Course. Radiology: ordered and independent interpretation performed.  Risk OTC drugs. Prescription drug management. Decision regarding hospitalization.   Patient presented to the ED forRecent shortness of breath.  PatientPatientSuggestive of an acute CHF  exacerbation.  Chest x-ray does show vascular congestion and small pleural effusions.  Patient has atelectasis versus pneumonia but I favor the former.  Patient does not have a fever.  He does not have a white count.  Patient does have slightly elevated troponins but this is decreased compared to previous and I think more consistent with his CHF exacerbation rather than acute coronary syndrome.  Consider the possibility of DVT with his left upper extremity swelling but that was negative for DVT and I suspect this is asymmetric because of his old injury to his  left upper extremity.  Patient has given a dose of diuretics but has not noticed much improvement.  Will order an additional dose consult the medical service for admission for further treatment because of his persistent shortness of breath.        Final Clinical Impression(s) / ED Diagnoses Final diagnoses:  Acute on chronic congestive heart failure, unspecified heart failure type [facility])    Rx / DC Orders ED Discharge Orders     None         Linwood Dibbles, MD 10/28/22 1544

## 2022-10-29 ENCOUNTER — Observation Stay (HOSPITAL_BASED_OUTPATIENT_CLINIC_OR_DEPARTMENT_OTHER): Payer: Medicaid Other

## 2022-10-29 DIAGNOSIS — Z888 Allergy status to other drugs, medicaments and biological substances status: Secondary | ICD-10-CM | POA: Diagnosis not present

## 2022-10-29 DIAGNOSIS — E1165 Type 2 diabetes mellitus with hyperglycemia: Secondary | ICD-10-CM | POA: Diagnosis present

## 2022-10-29 DIAGNOSIS — E785 Hyperlipidemia, unspecified: Secondary | ICD-10-CM

## 2022-10-29 DIAGNOSIS — E1151 Type 2 diabetes mellitus with diabetic peripheral angiopathy without gangrene: Secondary | ICD-10-CM | POA: Diagnosis present

## 2022-10-29 DIAGNOSIS — Z8249 Family history of ischemic heart disease and other diseases of the circulatory system: Secondary | ICD-10-CM | POA: Diagnosis not present

## 2022-10-29 DIAGNOSIS — I4891 Unspecified atrial fibrillation: Secondary | ICD-10-CM | POA: Diagnosis not present

## 2022-10-29 DIAGNOSIS — I6529 Occlusion and stenosis of unspecified carotid artery: Secondary | ICD-10-CM

## 2022-10-29 DIAGNOSIS — E1140 Type 2 diabetes mellitus with diabetic neuropathy, unspecified: Secondary | ICD-10-CM | POA: Diagnosis present

## 2022-10-29 DIAGNOSIS — I110 Hypertensive heart disease with heart failure: Secondary | ICD-10-CM | POA: Diagnosis not present

## 2022-10-29 DIAGNOSIS — Z95810 Presence of automatic (implantable) cardiac defibrillator: Secondary | ICD-10-CM

## 2022-10-29 DIAGNOSIS — I509 Heart failure, unspecified: Secondary | ICD-10-CM

## 2022-10-29 DIAGNOSIS — Z794 Long term (current) use of insulin: Secondary | ICD-10-CM

## 2022-10-29 DIAGNOSIS — I5021 Acute systolic (congestive) heart failure: Secondary | ICD-10-CM | POA: Diagnosis not present

## 2022-10-29 DIAGNOSIS — I5023 Acute on chronic systolic (congestive) heart failure: Secondary | ICD-10-CM | POA: Diagnosis present

## 2022-10-29 DIAGNOSIS — G4733 Obstructive sleep apnea (adult) (pediatric): Secondary | ICD-10-CM | POA: Diagnosis present

## 2022-10-29 DIAGNOSIS — Z885 Allergy status to narcotic agent status: Secondary | ICD-10-CM | POA: Diagnosis not present

## 2022-10-29 DIAGNOSIS — Z79899 Other long term (current) drug therapy: Secondary | ICD-10-CM | POA: Diagnosis not present

## 2022-10-29 DIAGNOSIS — Z7901 Long term (current) use of anticoagulants: Secondary | ICD-10-CM | POA: Diagnosis not present

## 2022-10-29 DIAGNOSIS — G8929 Other chronic pain: Secondary | ICD-10-CM | POA: Diagnosis present

## 2022-10-29 DIAGNOSIS — Z7902 Long term (current) use of antithrombotics/antiplatelets: Secondary | ICD-10-CM | POA: Diagnosis not present

## 2022-10-29 DIAGNOSIS — I480 Paroxysmal atrial fibrillation: Secondary | ICD-10-CM | POA: Diagnosis present

## 2022-10-29 DIAGNOSIS — Z7985 Long-term (current) use of injectable non-insulin antidiabetic drugs: Secondary | ICD-10-CM | POA: Diagnosis not present

## 2022-10-29 DIAGNOSIS — I2510 Atherosclerotic heart disease of native coronary artery without angina pectoris: Secondary | ICD-10-CM | POA: Diagnosis present

## 2022-10-29 DIAGNOSIS — Z951 Presence of aortocoronary bypass graft: Secondary | ICD-10-CM | POA: Diagnosis not present

## 2022-10-29 DIAGNOSIS — Z955 Presence of coronary angioplasty implant and graft: Secondary | ICD-10-CM | POA: Diagnosis not present

## 2022-10-29 DIAGNOSIS — Z833 Family history of diabetes mellitus: Secondary | ICD-10-CM | POA: Diagnosis not present

## 2022-10-29 DIAGNOSIS — Z7984 Long term (current) use of oral hypoglycemic drugs: Secondary | ICD-10-CM | POA: Diagnosis not present

## 2022-10-29 LAB — CBC
HCT: 45.7 % (ref 39.0–52.0)
Hemoglobin: 14.3 g/dL (ref 13.0–17.0)
MCH: 28.5 pg (ref 26.0–34.0)
MCHC: 31.3 g/dL (ref 30.0–36.0)
MCV: 91 fL (ref 80.0–100.0)
Platelets: 280 10*3/uL (ref 150–400)
RBC: 5.02 MIL/uL (ref 4.22–5.81)
RDW: 16.6 % — ABNORMAL HIGH (ref 11.5–15.5)
WBC: 9.1 10*3/uL (ref 4.0–10.5)
nRBC: 0 % (ref 0.0–0.2)

## 2022-10-29 LAB — ECHOCARDIOGRAM COMPLETE
Calc EF: 36.4 %
Height: 67 in
S' Lateral: 3.6 cm
Single Plane A2C EF: 34.5 %
Single Plane A4C EF: 38.4 %
Weight: 3837.77 oz

## 2022-10-29 LAB — LIPID PANEL
Cholesterol: 89 mg/dL (ref 0–200)
HDL: 25 mg/dL — ABNORMAL LOW (ref >40)
LDL Cholesterol: 45 mg/dL (ref 0–99)
Total CHOL/HDL Ratio: 3.6 RATIO
Triglycerides: 96 mg/dL (ref <150)
VLDL: 19 mg/dL (ref 0–40)

## 2022-10-29 LAB — GLUCOSE, CAPILLARY
Glucose-Capillary: 115 mg/dL — ABNORMAL HIGH (ref 70–99)
Glucose-Capillary: 135 mg/dL — ABNORMAL HIGH (ref 70–99)
Glucose-Capillary: 165 mg/dL — ABNORMAL HIGH (ref 70–99)
Glucose-Capillary: 220 mg/dL — ABNORMAL HIGH (ref 70–99)

## 2022-10-29 LAB — BASIC METABOLIC PANEL
Anion gap: 10 (ref 5–15)
BUN: 15 mg/dL (ref 6–20)
CO2: 28 mmol/L (ref 22–32)
Calcium: 8.4 mg/dL — ABNORMAL LOW (ref 8.9–10.3)
Chloride: 101 mmol/L (ref 98–111)
Creatinine, Ser: 1.3 mg/dL — ABNORMAL HIGH (ref 0.61–1.24)
GFR, Estimated: >60 mL/min (ref >60)
Glucose, Bld: 165 mg/dL — ABNORMAL HIGH (ref 70–99)
Potassium: 3.7 mmol/L (ref 3.5–5.1)
Sodium: 139 mmol/L (ref 135–145)

## 2022-10-29 LAB — HIV ANTIBODY (ROUTINE TESTING W REFLEX): HIV Screen 4th Generation wRfx: NONREACTIVE

## 2022-10-29 MED ORDER — PERFLUTREN LIPID MICROSPHERE
1.0000 mL | INTRAVENOUS | Status: AC | PRN
  Administered 2022-10-29: 4 mL via INTRAVENOUS

## 2022-10-29 NOTE — Care Management (Cosign Needed)
    Durable Medical Equipment  (From admission, onward)           Start     Ordered   10/29/22 1634  For home use only DME oxygen  Once       Comments: Portable oxygen  Question Answer Comment  Length of Need Lifetime   Mode or (Route) Nasal cannula   Liters per Minute 6   Frequency Continuous (stationary and portable oxygen unit needed)   Oxygen delivery system Gas      10/29/22 1635

## 2022-10-29 NOTE — Progress Notes (Signed)
Echocardiogram 2D Echocardiogram has been performed.  Warren Lacy Demetruis Depaul RDCS 10/29/2022, 9:05 AM  IV redressed in department

## 2022-10-29 NOTE — TOC Initial Note (Signed)
Transition of Care [facility]) - Initial/Assessment Note    Patient Details  Name: [name] MRN: [id number] Date of Birth: [birth year]  Transition of Care [facility]) CM/SW Contact:    Ronny Bacon, RN Phone Number: 10/29/2022, 4:30 PM  Clinical Narrative:  Patient from home with significant other. Patient confirms has RW, cane and WC at home. Patient needs portable home oxygen, arranged through [employer]. Significant other will transport patient home at discharge.                 Expected Discharge Plan: Home/Self Care Barriers to Discharge: Continued Medical Work up   Patient Goals and CMS Choice Patient states their goals for this hospitalization and ongoing recovery are:: to go home with portable oxygen          Expected Discharge Plan and Services   Discharge Planning Services: CM Consult   Living arrangements for the past 2 months: Single Family Home                 DME Arranged: Oxygen (portable) DME Agency: [employer] Date DME Agency Contacted: 10/29/22 Time DME Agency Contacted: 1622 Representative spoke with at DME Agency: Vaughan Basta            Prior Living Arrangements/Services Living arrangements for the past 2 months: Single Family Home Lives with:: Significant Other Patient language and need for interpreter reviewed:: Yes Do you feel safe going back to the place where you live?: Yes      Need for Family Participation in Patient Care: No (Comment) Care giver support system in place?: Yes (comment) Current home services: DME (RW, Cane, WC) Criminal Activity/Legal Involvement Pertinent to Current Situation/Hospitalization: No - Comment as needed  Activities of Daily Living Home Assistive Devices/Equipment: Nebulizer, Tub transfer bench, Walker (specify type), Oxygen, CBG Meter ADL Screening (condition at time of admission) Patient's cognitive ability adequate to safely complete daily activities?: Yes Is the patient deaf or have  difficulty hearing?: No Does the patient have difficulty seeing, even when wearing glasses/contacts?: No Does the patient have difficulty concentrating, remembering, or making decisions?: No Patient able to express need for assistance with ADLs?: No Does the patient have difficulty dressing or bathing?: No Independently performs ADLs?: Yes (appropriate for developmental age) Does the patient have difficulty walking or climbing stairs?: No Weakness of Legs: Both Weakness of Arms/Hands: None  Permission Sought/Granted Permission sought to share information with : Photographer granted to share info w AGENCY: [employer]        Emotional Assessment   Attitude/Demeanor/Rapport: Engaged Affect (typically observed): Appropriate Orientation: : Oriented to Self, Oriented to Place, Oriented to  Time, Oriented to Situation Alcohol / Substance Use: Not Applicable Psych Involvement: No (comment)  Admission diagnosis:  Acute exacerbation of CHF (congestive heart failure) (HCC) [I50.9] Acute on chronic congestive heart failure, unspecified heart failure type (HCC) [I50.9] Patient Active Problem List   Diagnosis Date Noted   Acute exacerbation of CHF (congestive heart failure) (HCC) 10/29/2022   ICD (implantable cardioverter-defibrillator) in place 03/02/2021   Carotid stenosis 12/02/2020   Carotid stenosis, symptomatic w/o infarct, right 12/01/2020   Acute on chronic HFrEF (heart failure with reduced ejection fraction) (HCC) 06/04/2020   Hx of CABG 11/23/2019   PAF (paroxysmal atrial fibrillation) (HCC) 11/23/2019   PVD (peripheral vascular disease) (HCC) 11/23/2019   Chronic back pain 11/23/2019   Prolonged Q-T interval on ECG 11/23/2019   CAD S/P percutaneous coronary angioplasty 08/29/2011  Tobacco abuse 08/29/2011   Hypertension 08/29/2011   Hyperlipidemia 08/29/2011   Diabetes mellitus (HCC) 08/29/2011   PCP:  Default, Provider, MD Pharmacy:    [facility] [place] [place] - [address] [place] [address] Phone: [phone] Fax: [phone]     Social Determinants of Health (SDOH) Social History: SDOH Screenings   Food Insecurity: No Food Insecurity (10/28/2022)  Housing: Low Risk  (10/28/2022)  Transportation Needs: No Transportation Needs (10/28/2022)  Utilities: Not At Risk (10/28/2022)  Alcohol Screen: Low Risk  (06/06/2020)  Financial Resource Strain: Low Risk  (06/06/2020)  Physical Activity: Insufficiently Active (10/29/2019)  Social Connections: Socially Isolated (10/29/2019)  Tobacco Use: Medium Risk (10/28/2022)   SDOH Interventions:     Readmission Risk Interventions     No data to display

## 2022-10-29 NOTE — Progress Notes (Signed)
Heart Failure Navigator Progress Note  Following this hospitalization to assess for HV TOC readiness.   Patient sleeping, will plan to interview this afternoon or tomorrow morning for HF TOC .   EF 25-30%   Rhae Hammock, BSN, Scientist, clinical (histocompatibility and immunogenetics) Only

## 2022-10-29 NOTE — Progress Notes (Signed)
Mobility Specialist Progress Note:   10/29/22 1100  Mobility  Activity Ambulated with assistance in hallway  Level of Assistance Standby assist, set-up cues, supervision of patient - no hands on  Assistive Device None  Distance Ambulated (ft) 290 ft  Activity Response Tolerated well  Mobility Referral Yes  $Mobility charge 1 Mobility  Mobility Specialist Start Time (ACUTE ONLY) 1100  Mobility Specialist Stop Time (ACUTE ONLY) 1119  Mobility Specialist Time Calculation (min) (ACUTE ONLY) 19 min   Pt eager for mobility session. Required 6LO2 to maintain SpO2 WFL, pt desat to 87% on 4LO2 (no 5L setting on O2 tank). No c/o throughout. Pt left sitting in chair with all needs met, on 5LO2.  Addison Lank Mobility Specialist Please contact via SecureChat or  Rehab office at [phone]

## 2022-10-29 NOTE — Progress Notes (Signed)
Subjective: Overnight patient desatted to 87% on 3 L nasal cannula, so it was increased to 5 L.  He was given 1 dose of 40 mg IV Lasix. He says he did not feel any shortness of breath overnight and is doing much better today. Laying in bed flat at the time of exam. Says his swelling has improved. Encouraged him to get out of bed and into chair today.   Objective:  Vital signs in last 24 hours: Vitals:   10/29/22 0328 10/29/22 0504 10/29/22 0919 10/29/22 1141  BP: (!) 145/77  118/73 99/60  Pulse: 66  (!) 55 (!) 54  Resp: 20  20 14   Temp: 98.1 F (36.7 C)  97.8 F (36.6 C) 97.8 F (36.6 C)  TempSrc: Oral  Oral Oral  SpO2: 92%  92% 92%  Weight:  108.8 kg    Height:       Physical Exam: Constitutional: well-appearing, no acute distress Cardio: Regular rate and rhythm, no murmurs noted; distant heart sounds; well-perfused, edema 1+ on LUE and LLE.  Pulm: Very mild crackles in lung bases, improved from yesterday.  No accessory muscle use.  Labs:  CBC: Unremarkable  BMP: Creatinine 1.3, up from 1.18.  Baseline seems to be around 1-1.1.  Lipids: HDL 25, LDL 45, triglycerides 96, VLDL 19.  Imaging:  Echocardiogram: Left ventricle ejection fraction is 25 to 30% with decreased function and global hypokinesis.  There is moderate concentric left ventricular hypertrophy.  Right ventricular systolic function is mildly reduced.  Assessment/Plan:  Principal Problem:   Acute on chronic HFrEF (heart failure with reduced ejection fraction) (HCC) Active Problems:   CAD S/P percutaneous coronary angioplasty   Hypertension   Diabetes mellitus (HCC)   PAF (paroxysmal atrial fibrillation) (HCC)  Patient Summary: [name] is a 62 y.o. male with a PMH of coronary artery disease s/p CABG and ICD placement, hyperlipidemia, Type 2 diabetes, HFrEF (25-30%), carotid artery stenosis s/p endarterectomy, PAD s/p distal right superficial femoral and proximal popliteal artery stents; right  common iliac artery stent; and drug-coated balloon angioplasty, who presented with 4-5 days of progressively worsening shortness of breath and is admitted for management of acute on chronic heart failure exacerbation on hospital day 1.  Acute CHF exacerbation HFrEF (25-30% per echo in 2022) Coronary artery disease s/p CABG (2021) and ICD placement  Atrial fibrillation Patient remains hemodynamically stable and has been responding well to diuresis, with a net output of 2 L yesterday.  Desatted last night, but does not report any shortness of breath.  Currently on 3 L nasal cannula.  Patient is able to lay flat comfortably without orthopnea.  Echo demonstrated reduced ejection fraction of 25 to 30%, unchanged from prior.  As long as patient continues to improved clinically with diuresis, anticipate discharge tomorrow. -Strict I's and O's -Lasix 40 mg IV twice daily for diuresis -Continue Eliquis 5 mg twice daily -Continue Coreg 25 mg twice daily  -Continue spironolactone 12.5 mg daily -Wean off oxygen as tolerated  Type 2 diabetes, poorly controlled Hemoglobin A1c pending.  Prior in 10/23 was 8.1.  Patient follows with endocrinology.  Blood glucose in the past 24 hours has ranged from 115-146. -Follow-up hemoglobin A1c -Tresiba 50 units daily in the morning, SSI with meals -Continue Jardiance 25 mg daily  Hypertension Hyperlipidemia Peripheral artery disease Carotid artery stenosis s/p endarterectomy Blood pressure is 145/77 this morning.  LDL 45, HDL 25. Patient has PAD s/p distal right superficial femoral and proximal popliteal artery  stents; right common iliac artery stent; and drug-coated balloon angioplasty. -Continue Entresto 97-103 mg twice daily -Continue Plavix 75 mg daily, Lipitor 80 mg daily  Diet: Heart Healthy VTE: Eliquis 5 mg BID IVF: None Code: Full   Dispo: Anticipated discharge to  Home pending medical stability and safe dispo plan.   Annett Fabian, MD 10/29/2022,  11:53 AM Pager: [phone] After 5pm on weekdays and 1pm on weekends: On Call pager [phone]

## 2022-10-30 ENCOUNTER — Encounter (HOSPITAL_COMMUNITY): Payer: Self-pay | Admitting: Student in an Organized Health Care Education/Training Program

## 2022-10-30 LAB — BASIC METABOLIC PANEL
Anion gap: 9 (ref 5–15)
BUN: 24 mg/dL — ABNORMAL HIGH (ref 6–20)
CO2: 27 mmol/L (ref 22–32)
Calcium: 8.4 mg/dL — ABNORMAL LOW (ref 8.9–10.3)
Chloride: 104 mmol/L (ref 98–111)
Creatinine, Ser: 1.46 mg/dL — ABNORMAL HIGH (ref 0.61–1.24)
GFR, Estimated: 55 mL/min — ABNORMAL LOW (ref >60)
Glucose, Bld: 166 mg/dL — ABNORMAL HIGH (ref 70–99)
Potassium: 3.6 mmol/L (ref 3.5–5.1)
Sodium: 140 mmol/L (ref 135–145)

## 2022-10-30 LAB — GLUCOSE, CAPILLARY
Glucose-Capillary: 165 mg/dL — ABNORMAL HIGH (ref 70–99)
Glucose-Capillary: 154 mg/dL — ABNORMAL HIGH (ref 70–99)
Glucose-Capillary: 131 mg/dL — ABNORMAL HIGH (ref 70–99)
Glucose-Capillary: 142 mg/dL — ABNORMAL HIGH (ref 70–99)

## 2022-10-30 LAB — HEMOGLOBIN A1C
Hgb A1c MFr Bld: 9.2 % — ABNORMAL HIGH (ref 4.8–5.6)
Mean Plasma Glucose: 217 mg/dL

## 2022-10-30 MED ORDER — SPIRONOLACTONE 25 MG PO TABS
25.0000 mg | ORAL_TABLET | Freq: Every day | ORAL | Status: DC
  Administered 2022-10-31: 25 mg via ORAL
  Filled 2022-10-30: qty 1

## 2022-10-30 MED ORDER — FUROSEMIDE 10 MG/ML IJ SOLN
80.0000 mg | Freq: Once | INTRAMUSCULAR | Status: AC
  Administered 2022-10-30: 80 mg via INTRAVENOUS
  Filled 2022-10-30: qty 8

## 2022-10-30 MED ORDER — SACUBITRIL-VALSARTAN 49-51 MG PO TABS
1.0000 | ORAL_TABLET | Freq: Two times a day (BID) | ORAL | Status: DC
  Administered 2022-10-30 – 2022-10-31 (×2): 1 via ORAL
  Filled 2022-10-30 (×2): qty 1

## 2022-10-30 NOTE — Progress Notes (Signed)
   Heart Failure Stewardship Pharmacist Progress Note   PCP: Default, Provider, MD PCP-Cardiologist: Olga Millers, MD    HPI:  62 yo M with PMH of CAD s/p CABG, CHF, HLD, HTN, obesity, PAD, and T2DM.   Admitted 10/2019 with shortness of breath, LE edema, and fatigue. Cath showed multivessel disease, referred for CABG. ECHO with EF reduced to 30-35%. CABG done 10/29/19.  Readmitted 11/2019 with wound infection from surgery.   Admitted 05/2020 with acute on chronic CHF due to medication noncompliance. BNP 1905. Repeat ECHO showed LVEF reduced further to 25-30%. Followed up with HF TOC clinic on 06/08/20.   ICD placed 10/26/20.  Presented to the ED on 7/7 with shortness of breath, orthopnea, and LE edema. Reports dietary noncompliance but compliance to most medications (states he ran out of carvedilol for a month but has been taking it for the last week). States Dr. Jens Som instructed him to increase his lasix as outpatient with shortness of breath. He did not have improvement in his symptoms. CXR with central venous congestion and bilateral pleural effusions. ECHO 7/8 showed LVEF 25-30% (stable from 05/2020), global hypokinesis, moderate concentric LVH, G2DD, RV mildly reduced.   Current HF Medications: Diuretic: furosemide 80 mg IV x 1 Beta Blocker: carvedilol 25 mg BID ACE/ARB/ARNI: Entresto 97/103 mg BID MRA: spironolactone 12.5 mg daily SGLT2i: Jardiance 25 mg daily  Prior to admission HF Medications: Diuretic: furosemide 40 mg daily Beta blocker: carvedilol 25 mg BID MRA: spironolactone 25 mg daily SGLT2i: Jardiance 25 mg daily  Pertinent Lab Values: Serum creatinine 1.46, BUN 24, Potassium 3.6, Sodium 140, BNP 796.1, Magnesium 2.1, A1c 9.2   Vital Signs: Weight: 240 lbs (admission weight: 235 lbs) Blood pressure: 130-140/70s  Heart rate: 50-60s  I/O: net -1.4L yesterday; net -3.5L since admission  Medication Assistance / Insurance Benefits Check: Does the patient have  prescription insurance?  Yes Type of insurance plan: [place] Medicaid   Outpatient Pharmacy:  Prior to admission outpatient pharmacy: [facility] Is the patient willing to use [facility] TOC pharmacy at discharge? Yes Is the patient willing to transition their outpatient pharmacy to utilize a [facility] outpatient pharmacy?   No    Assessment: 1. Acute on chronic systolic CHF (LVEF 25-30%), due to ICM. NYHA class III symptoms. - Agree with furosemide 80 mg IV x 1 today. Strict I/Os and daily weights.  - Continue carvedilol 25 mg BID - Continue Entresto 97/103 mg BID. He reports not taking Entresto PTA - he is unsure why this was stopped in the past but he has been off of it for several months. - Consider increasing spironolactone to 25 mg daily. He reports that he has gone back and forth with taking 12.5 and 25 mg daily based on potassium levels. Most recently, has been taking 25 mg daily with no issues. - Continue Jardiance 25 mg daily   Plan: 1) Medication changes recommended at this time: - Increase spironolactone to 25 mg daily  2) Patient assistance: - None pending, has  Medicaid  3)  Education  - Patient has been educated on current HF medications and potential additions to HF medication regimen - Patient verbalizes understanding that over the next few months, these medication doses may change and more medications may be added to optimize HF regimen - Patient has been educated on basic disease state pathophysiology and goals of therapy   Sharen Hones, PharmD, BCPS Heart Failure Stewardship Pharmacist Phone [phone]

## 2022-10-30 NOTE — Progress Notes (Signed)
Mobility Specialist Progress Note:   10/30/22 1500  Mobility  Activity Ambulated with assistance in hallway  Level of Assistance Standby assist, set-up cues, supervision of patient - no hands on  Assistive Device None  Distance Ambulated (ft) 300 ft  Activity Response Tolerated well  Mobility Referral Yes  $Mobility charge 1 Mobility  Mobility Specialist Start Time (ACUTE ONLY) 1450  Mobility Specialist Stop Time (ACUTE ONLY) 1505  Mobility Specialist Time Calculation (min) (ACUTE ONLY) 15 min   Pt agreeable to mobility session. Required 3LO2 to maintain SpO2 >88%. No c/o throughout. Pt back in bed with all needs met.   Addison Lank Mobility Specialist Please contact via SecureChat or  Rehab office at [phone]

## 2022-10-30 NOTE — Progress Notes (Signed)
SATURATION QUALIFICATIONS: (This note is used to comply with regulatory documentation for home oxygen)  Patient Saturations on Room Air at Rest = 95%  Patient Saturations on Room Air while Ambulating = 85-92%  Patient Saturations on 3 Liters of oxygen while Ambulating = 94%  Please briefly explain why patient needs home oxygen:  While ambulating without O2 patient dropped to the lowest of 85. When we stopped walking he quickly recovered to mid 90's. He states he wears 3L chronically at night and sometimes during the day if needed.

## 2022-10-30 NOTE — Progress Notes (Signed)
Heart Failure Nurse Navigator Progress Note  PCP: Default, Provider, MD PCP-Cardiologist: [place] Admission Diagnosis: Acute on chronic congestive heart failure.  Admitted from: Home  Presentation:   GEHRIG PATRAS presented with fluid build up in his arm and stomach, back pain. Shortness of breath, BNP 796, Troponin 36, IV lasix given, CXR shows central venus congestion, possible atelectasis or pneumonia. Patient reported that he ran out of his carvedilol for about a month, but has taken it recently.   Patient was seen in HF TOC in 12/2020, was reeducated on the sign and symptoms of heart failure, daily weights, when to call his doctor or ogo the ED, Diet/ fluid restrictions, taking his medications as prescribed and attending all medical appointments, patient verbalized his understanding, a HF TOC appointment was scheduled for 11/07/2022 @ 1:30 pm.   ECHO/ LVEF: 25-30% G2DD  Clinical Course:  Past Medical History:  Diagnosis Date   AICD (automatic cardioverter/defibrillator) present    Anginal pain (HCC)    Anxiety    CAD (coronary artery disease)    CHF (congestive heart failure) (HCC)    Chronic back pain    Colon polyps    Diabetes mellitus    Heart attack (HCC)    Hyperlipidemia    Hypertension    Neuropathy    Panic attacks    PVD (peripheral vascular disease) (HCC)    Sleep apnea      Social History   Socioeconomic History   Marital status: Legally Separated    Spouse name: Not on file   Number of children: 3   Years of education: Not on file   Highest education level: Not on file  Occupational History    Comment: Unemployed  Tobacco Use   Smoking status: Former    Packs/day: 0.25    Years: 25.00    Additional pack years: 0.00    Total pack years: 6.25    Types: Cigarettes    Start date: 78    Quit date: 10/23/2019    Years since quitting: 3.0    Passive exposure: Never   Smokeless tobacco: Never   Tobacco comments:    1/4 pack per day  Vaping Use    Vaping Use: Never used  Substance and Sexual Activity   Alcohol use: No    Alcohol/week: 0.0 standard drinks of alcohol   Drug use: No   Sexual activity: Not on file  Other Topics Concern   Not on file  Social History Narrative   Not on file   Social Determinants of Health   Financial Resource Strain: Low Risk  (06/06/2020)   Overall Financial Resource Strain (CARDIA)    Difficulty of Paying Living Expenses: Not very hard  Food Insecurity: No Food Insecurity (10/28/2022)   Hunger Vital Sign    Worried About Running Out of Food in the Last Year: Never true    Ran Out of Food in the Last Year: Never true  Transportation Needs: No Transportation Needs (10/28/2022)   PRAPARE - Administrator, Civil Service (Medical): No    Lack of Transportation (Non-Medical): No  Physical Activity: Insufficiently Active (10/29/2019)   Exercise Vital Sign    Days of Exercise per Week: 1 day    Minutes of Exercise per Session: 30 min  Stress: Not on file  Social Connections: Socially Isolated (10/29/2019)   Social Connection and Isolation Panel [NHANES]    Frequency of Communication with Friends and Family: More than three times a week  Frequency of Social Gatherings with Friends and Family: More than three times a week    Attends Religious Services: Never    Database administrator or Organizations: No    Attends Engineer, structural: Never    Marital Status: Divorced   Water engineer and Provision:  Detailed education and instructions provided on heart failure disease management including the following:  Signs and symptoms of Heart Failure When to call the physician Importance of daily weights Low sodium diet Fluid restriction Medication management Anticipated future follow-up appointments  Patient education given on each of the above topics.  Patient acknowledges understanding via teach back method and acceptance of all instructions.  Education Materials:   "Living Better With Heart Failure" Booklet, HF zone tool, & Daily Weight Tracker Tool.  Patient has scale at home: Yes Patient has pill box at home: Yes    High Risk Criteria for Readmission and/or Poor Patient Outcomes: Heart failure hospital admissions (last 6 months): 1  No Show rate: 5% Difficult social situation: No, lives with son Demonstrates medication adherence: Yes Primary Language: English Literacy level: Reading, writing, and comprehension  Barriers of Care:   Diet/ Fluid compliance ( salt/ coke) Daily weights   Considerations/Referrals:   Referral made to Heart Failure Pharmacist Stewardship: yes Referral made to Heart Failure CSW/NCM TOC: No Referral made to Heart & Vascular TOC: 11/07/2022 @ 1:30 PM  Items for Follow-up on DC/TOC: Diet/ fluid/ daily weights ( salt/soda) Continued HF education   Rhae Hammock, BSN, RN Heart Failure Print production planner Chat Only

## 2022-10-30 NOTE — Plan of Care (Signed)
  Problem: Education: Goal: Ability to describe self-care measures that may prevent or decrease complications (Diabetes Survival Skills Education) will improve Outcome: Progressing   Problem: Education: Goal: Knowledge of General Education information will improve Description: Including pain rating scale, medication(s)/side effects and non-pharmacologic comfort measures Outcome: Progressing   Problem: Health Behavior/Discharge Planning: Goal: Ability to manage health-related needs will improve Outcome: Progressing   Problem: Clinical Measurements: Goal: Ability to maintain clinical measurements within normal limits will improve Outcome: Progressing   

## 2022-10-30 NOTE — Progress Notes (Signed)
Subjective: Patient says he feels better today.  No shortness of breath.  On 5 L.  States he has been urinating frequently on the Lasix.  Lying flat in bed with 1 pillow.  Objective:  Vital signs in last 24 hours: Vitals:   10/30/22 0528 10/30/22 0530 10/30/22 0531 10/30/22 0755  BP: (!) 159/115 (!) 166/141 (!) 144/76 131/68  Pulse: 63 65 66 (!) 57  Resp: 19 14 15 17   Temp: 97.8 F (36.6 C)   97.6 F (36.4 C)  TempSrc: Oral   Oral  SpO2: 91%  93% 91%  Weight:   109 kg   Height:       Physical Exam: Constitutional: well-appearing, laying flat in bed with 1 pillow Cardio: Regular rate and rhythm, no murmurs, distant heart sounds.  1+ pitting edema in the left lower extremity.  Mild JVD noted around 4 cm.  Pulm: Lungs clear to auscultation bilaterally.  No accessory muscle use, wheezing, rales. Skin: Significant sternotomy scarring on the chest.  Labs:  BMP: Creatinine 1.46, up from 1.18 48 hours ago.  Baseline seems to be around 1-1.1.  Assessment/Plan:  Principal Problem:   Acute on chronic HFrEF (heart failure with reduced ejection fraction) (HCC) Active Problems:   CAD S/P percutaneous coronary angioplasty   Hypertension   Diabetes mellitus (HCC)   PAF (paroxysmal atrial fibrillation) (HCC)   Acute exacerbation of CHF (congestive heart failure) (HCC)  Patient Summary: [name] is a 62 y.o. male with a PMH of coronary artery disease s/p CABG and ICD placement, hyperlipidemia, Type 2 diabetes, HFrEF (25-30%), carotid artery stenosis s/p endarterectomy, PAD s/p distal right superficial femoral and proximal popliteal artery stents; right common iliac artery stent; and drug-coated balloon angioplasty, who presented with 4-5 days of progressively worsening shortness of breath and is being treated for acute on chronic heart failure exacerbation on hospital day 2.   Acute CHF exacerbation HFrEF (25-30% per echo in 2022) Coronary artery disease s/p CABG (2021) and ICD  placement  Atrial fibrillation Patient's volume status has improved since yesterday, still some edema noted predominantly on the left.  He only had a net of 664 cc output yesterday after receiving 40 mg IV Lasix twice.  While ambulating yesterday, he desatted to 87% on 4 L nasal cannula and required 6 L to prevent desats.  Echo from yesterday was unchanged from prior.  He does not have any current shortness of breath on 5 L, and is laying flat in bed comfortably. -Increase Lasix to 80 mg IV -Ambulation with pulse oximetry today -Wean off oxygen as tolerated -Strict I's and O's -Continue home medications: Eliquis 5 mg twice daily, Coreg 25 mg twice daily, Spironolactone 25 mg daily  Type 2 diabetes, poorly controlled Hemoglobin A1c 9.2. Blood glucose in the past 24 hours has ranged from 115-220. - Patient should follow-up with PCP for diabetes management -Tresiba 50 units daily in the morning, SSI with meals -Continue home Jardiance 25 mg daily  Hypertension Hyperlipidemia Peripheral artery disease Carotid artery stenosis s/p endarterectomy Blood pressure is 131/68 this morning.  No acute changes in the past 24 hours. -Continue Entresto 97-103 mg twice daily -Continue Plavix 75 mg daily, Lipitor 80 mg daily  Diet: Heart Healthy VTE: Eliquis 5 mg BID IVF: None Code: Full   Dispo: Anticipated discharge to  Home pending medical stability and safe dispo plan.   Annett Fabian, MD 10/30/2022, 11:25 AM Pager: [phone] After 5pm on weekdays and 1pm on weekends: On  Call pager [phone]

## 2022-10-30 NOTE — Inpatient Diabetes Management (Signed)
Inpatient Diabetes Program Recommendations  AACE/ADA: New Consensus Statement on Inpatient Glycemic Control (2015)  Target Ranges:  Prepandial:   less than 140 mg/dL      Peak postprandial:   less than 180 mg/dL (1-2 hours)      Critically ill patients:  140 - 180 mg/dL   Lab Results  Component Value Date   GLUCAP 154 (H) 10/30/2022   HGBA1C 9.2 (H) 10/29/2022    Review of Glycemic Control  Diabetes history: DM 2 Outpatient Diabetes medications: Tresiba 50 units Qam, Jardiance 25 mg Daily, Trulicity 3 mg weekly, metformin 1000 mg bid Current orders for Inpatient glycemic control:  Semglee 50 units Daily Jardiance 25 mg Daily Novolog 0-15 units tid + hs   Spoke with pt at bedside regarding A1c of 9.2% and glucose control at home. Pt reports not doing very well with his diet since his son is home from college and also reports not checking his glucose levels as he should. Encouraged bid glucose checks and following a diabetic diet. Pt sees Dr. Roanna Raider outpatient and sees her regularly for DM management. Pt reports knowing what to do in lifestyle modifications and reports he will follow better lifestyle choices.   Thanks,  Christena Deem RN, MSN, BC-ADM Inpatient Diabetes Coordinator Team Pager [phone] (8a-5p)

## 2022-10-30 NOTE — Plan of Care (Signed)

## 2022-10-31 ENCOUNTER — Other Ambulatory Visit (HOSPITAL_COMMUNITY): Payer: Self-pay

## 2022-10-31 LAB — BASIC METABOLIC PANEL
Anion gap: 8 (ref 5–15)
BUN: 22 mg/dL — ABNORMAL HIGH (ref 6–20)
CO2: 24 mmol/L (ref 22–32)
Calcium: 8.5 mg/dL — ABNORMAL LOW (ref 8.9–10.3)
Chloride: 107 mmol/L (ref 98–111)
Creatinine, Ser: 1.17 mg/dL (ref 0.61–1.24)
GFR, Estimated: >60 mL/min (ref >60)
Glucose, Bld: 135 mg/dL — ABNORMAL HIGH (ref 70–99)
Potassium: 3.7 mmol/L (ref 3.5–5.1)
Sodium: 139 mmol/L (ref 135–145)

## 2022-10-31 LAB — GLUCOSE, CAPILLARY
Glucose-Capillary: 103 mg/dL — ABNORMAL HIGH (ref 70–99)
Glucose-Capillary: 167 mg/dL — ABNORMAL HIGH (ref 70–99)

## 2022-10-31 MED ORDER — SPIRONOLACTONE 25 MG PO TABS
25.0000 mg | ORAL_TABLET | Freq: Every day | ORAL | 0 refills | Status: DC
  Filled 2022-10-31: qty 30, 30d supply, fill #0

## 2022-10-31 MED ORDER — FUROSEMIDE 40 MG PO TABS
40.0000 mg | ORAL_TABLET | Freq: Two times a day (BID) | ORAL | 1 refills | Status: DC
  Filled 2022-10-31: qty 90, 45d supply, fill #0
  Filled 2022-10-31: qty 44, 30d supply, fill #0

## 2022-10-31 MED ORDER — SACUBITRIL-VALSARTAN 49-51 MG PO TABS
1.0000 | ORAL_TABLET | Freq: Two times a day (BID) | ORAL | 0 refills | Status: AC
  Filled 2022-10-31: qty 60, 30d supply, fill #0

## 2022-10-31 MED ORDER — METFORMIN HCL 1000 MG PO TABS
1000.0000 mg | ORAL_TABLET | Freq: Two times a day (BID) | ORAL | 0 refills | Status: AC
  Filled 2022-10-31: qty 60, 30d supply, fill #0

## 2022-10-31 NOTE — Progress Notes (Signed)
Mobility Specialist Progress Note:   10/31/22 0940  Mobility  Activity Ambulated with assistance in hallway  Level of Assistance Standby assist, set-up cues, supervision of patient - no hands on  Assistive Device None  Distance Ambulated (ft) 300 ft  Activity Response Tolerated well  Mobility Referral Yes  $Mobility charge 1 Mobility  Mobility Specialist Start Time (ACUTE ONLY) 0940  Mobility Specialist Stop Time (ACUTE ONLY) 1000  Mobility Specialist Time Calculation (min) (ACUTE ONLY) 20 min   Pre Mobility: SpO2 97% 3LO2 During Mobility: SpO2 90% 3LO2 Post Mobility: SpO2 95% 3LO2  Pt agreeable to mobility session. No physical assistance required. VSS on 3LO2. Pt back in bed with all needs met.   Addison Lank Mobility Specialist Please contact via SecureChat or  Rehab office at [phone]

## 2022-10-31 NOTE — TOC Transition Note (Addendum)
Transition of Care [facility]) - CM/SW Discharge Note   Patient Details  Name: [name] MRN: [id number] Date of Birth: 1961-02-21  Transition of [facility] CM/SW Contact:  Leone Haven, RN Phone Number: 10/31/2022, 1:08 PM   Clinical Narrative:    Patient is for dc today , he has home oxygen with [employer], 3 liters at home.  He will need a tank to go home with ,wife will bring a tank in the room for him to go home with.  Patient states he would like to be evaluated for poc, NCM notified Mitch with [employer] of this information.  Patient's wife will be transporting him home today.  He does not have any HH needs.  He states the doctor told him to follow up with his PCP for cpap.       Barriers to Discharge: Continued Medical Work up   Patient Goals and CMS Choice      Discharge Placement                         Discharge Plan and Services Additional resources added to the After Visit Summary for     Discharge Planning Services: CM Consult            DME Arranged: Oxygen (portable) DME Agency: [employer] Date DME Agency Contacted: 10/29/22 Time DME Agency Contacted: 1622 Representative spoke with at DME Agency: Vaughan Basta            Social Determinants of Health (SDOH) Interventions SDOH Screenings   Food Insecurity: No Food Insecurity (10/28/2022)  Housing: Low Risk  (10/30/2022)  Transportation Needs: No Transportation Needs (10/30/2022)  Utilities: Not At Risk (10/28/2022)  Alcohol Screen: Low Risk  (10/30/2022)  Financial Resource Strain: Low Risk  (10/30/2022)  Physical Activity: Insufficiently Active (10/29/2019)  Social Connections: Socially Isolated (10/29/2019)  Tobacco Use: Medium Risk (10/30/2022)     Readmission Risk Interventions     No data to display

## 2022-10-31 NOTE — Discharge Summary (Signed)
Name: [name] MRN: [id number] DOB: April 20, 1961 62 y.o. PCP: Default, Provider, MD  Date of Admission: 10/28/2022  9:09 AM Date of Discharge:  10/31/2022 Attending Physician: Dr. Oswaldo Done  DISCHARGE DIAGNOSIS:  Primary Problem: Acute on chronic HFrEF (heart failure with reduced ejection fraction) [facility])   Hospital Problems: Principal Problem:   Acute on chronic HFrEF (heart failure with reduced ejection fraction) (HCC) Active Problems:   CAD S/P percutaneous coronary angioplasty   Hypertension   Diabetes mellitus (HCC)   PAF (paroxysmal atrial fibrillation) (HCC)   Acute exacerbation of CHF (congestive heart failure) (HCC)    DISCHARGE MEDICATIONS:   Allergies as of 10/31/2022       Reactions   Codeine Itching, Shortness Of Breath   Gabapentin Other (See Comments)   Suicidal thoughts, extreme dreams   Pregabalin Other (See Comments)   Suicidal thoughts, extreme dreams    Simvastatin Other (See Comments)   Pain in joints   Lisinopril Itching, Rash   Morphine Rash   Venlafaxine Rash        Medication List     STOP taking these medications    empagliflozin 25 MG Tabs tablet Commonly known as: Jardiance   ezetimibe 10 MG tablet Commonly known as: Zetia   sacubitril-valsartan 97-103 MG Commonly known as: ENTRESTO Replaced by: sacubitril-valsartan 49-51 MG       TAKE these medications    Accu-Chek Guide test strip Generic drug: glucose blood   atorvastatin 80 MG tablet Commonly known as: LIPITOR Take 1 tablet (80 mg total) by mouth daily.   carvedilol 25 MG tablet Commonly known as: COREG Take 1 tablet (25 mg total) by mouth 2 (two) times daily with a meal.   clopidogrel 75 MG tablet Commonly known as: Plavix Take 1 tablet (75 mg total) by mouth daily.   Eliquis 5 MG Tabs tablet Generic drug: apixaban TAKE 1 TABLET BY MOUTH TWICE DAILY   furosemide 40 MG tablet Commonly known as: LASIX Take 1 tablet (40 mg total) by mouth 2 (two) times  daily for 14 days then take 1 tablet once daily. What changed: when to take this   metFORMIN 1000 MG tablet Commonly known as: GLUCOPHAGE Take 1 tablet (1,000 mg total) by mouth 2 (two) times daily.   omeprazole 20 MG tablet Commonly known as: PRILOSEC OTC Take 20 mg by mouth daily as needed (for reflux).   OXYGEN Inhale 3 L/min into the lungs at bedtime as needed (for shortness of breath).   sacubitril-valsartan 49-51 MG Commonly known as: ENTRESTO Take 1 tablet by mouth 2 (two) times daily. Replaces: sacubitril-valsartan 97-103 MG   spironolactone 25 MG tablet Commonly known as: ALDACTONE Take 0.5 tablets (12.5 mg total) by mouth daily. What changed:  how much to take when to take this   spironolactone 25 MG tablet Commonly known as: ALDACTONE Take 1 tablet (25 mg total) by mouth daily. Start taking on: November 01, 2022 What changed: You were already taking a medication with the same name, and this prescription was added. Make sure you understand how and when to take each.   Evaristo Bury FlexTouch 100 UNIT/ML FlexTouch Pen Generic drug: insulin degludec Inject 50 Units into the skin in the morning.   Trulicity 3 MG/0.5ML Sopn Generic drug: Dulaglutide Inject 3 mg into the skin every Wednesday.               Durable Medical Equipment  (From admission, onward)  Start     Ordered   10/31/22 1316  For home use only DME Other see comment  Once       Comments: Evaluate for poc.  Question:  Length of Need  Answer:  Lifetime   10/31/22 1316            DISPOSITION AND FOLLOW-UP:  [name] was discharged from [facility] in Stable condition. At the hospital follow up visit please address:  HFrEF (EF 25-30%): Patient was admitted for acute CHF exacerbation.  Upon discharge, we recommended he double up on his Lasix until his follow-up with his cardiologist.  He was not taking Entresto, so we restarted him on 49-51 mg BID.   Consider rechecking creatinine and increasing to 97-103 mg BID.  He is also taking spironolactone 25 mg daily and Coreg 25 mg twice daily.  Obstructive Sleep Apnea: During admission, he was noted to have apneic episodes overnight with desats into the 50s.  He has not been using his CPAP mask at home because it is uncomfortable.  We encouraged him to try to use it, as it is important for his heart and blood pressure.   Type II diabetes, poorly controlled: Hemoglobin A1c 9.2.  Patient is taking [place] 25 units daily, Jardiance 25 mg daily, dulaglutide 1.5 mg weekly.  He ran out of metformin so we restarted him on 1000 mg twice daily.  Follow-up Recommendations: Consults: None Labs: BMP Studies: None Medications: Consider increasing Entresto to 97-103 mg twice daily. Doubled up on Lasix- taking 40 mg twice daily for 14 days; decrease to once daily after.  Follow-up Appointments:  Follow-up Information     Azalee Course, [place] .   Specialties: Cardiology, Radiology Contact information: [address] [place] [address] [phone]         [facility]. Go in 7 day(s).   Specialty: Cardiology Why: Hospital follow up 11/07/2022 @ 1:30 pm PLEASE bring a current medication list to appointment FREE valet parking, Entrance C, off [employer] information: [address] [id number] mc [place] [address] [phone]                HOSPITAL COURSE:  Patient Summary: [name] is a 62 y.o. male with a past medical history of coronary artery disease s/p ICD placement, hyperlipidemia, T2DM, HFrEF, OSA, and fibrillation who presented with shortness of breath and swelling and was admitted for management of an acute CHF exacerbation.  Acute CHF exacerbation HFrEF (25-30%) Coronary artery disease s/p CABG (2021) and ICD placement  Atrial fibrillation Patient has a history of HFrEF and follows  with Dr. Jens Som, his cardiologist, outpatient.  He was admitted with elevated BNP, shortness of breath, orthopnea, and edema in the left upper and lower extremities.  Chest x-ray showed bilateral pulmonary edema and he had bibasilar crackles on exam.  Started on IV Lasix, responded moderately to 40 mg IV; he was increased to 80 mg IV and returned to baseline.  On ambulatory walk test he was able to ambulate on 3 L nasal cannula without desats.  We will restart his home medications including: Coreg 25 mg twice daily, Spironolactone 12.5 mg daily, and Lasix 40 mg twice daily, and Entresto 49-51 mg BID.  We have doubled up on his Lasix until his outpatient follow-up with cardiology.  We started him on a low dose of Entresto; pharmacist has recommended increasing base as long as his  creatinine remains normal.  For his Afib, he is currently taking Eliquis 5 mg twice daily.  He is on 3 L of oxygen by nasal cannula nightly at home.   Obstructive Sleep Apnea Desats were noted overnight into the 50s while the patient was on 3 L nasal cannula.  Episodes of desat lasted 20 to 30 seconds each.  Patient previously used CPAP mask, but stopped using it a while ago because it was uncomfortable.  We spoke with him about the importance of continuing to use his CPAP mask to treat his OSA and to ensure he receives adequate oxygenation overnight.  Type 2 diabetes, poorly controlled Last hemoglobin A1c 9.2.  Patient is following out patient with endocrinology.  Current regimen includes dulaglutide 1.5 mg weekly, Jardiance 25 mg daily, metformin 1000 mg twice daily, and Tresiba 50 units daily.   Hypertension Hyperlipidemia Peripheral artery disease Patient is s/p distal right superficial femoral and proximal popliteal artery stents; right common iliac artery stent; drug-coated balloon angioplasty; and carotid endarterectomy. We will continue his home hypertension medications as listed above.  He is taking Lipitor 80 mg daily  and Plavix 75 mg daily for his hyperlipidemia/PAD.   DISCHARGE INSTRUCTIONS:   Discharge Instructions     Call MD for:  difficulty breathing, headache or visual disturbances   Complete by: As directed    Call MD for:  persistant dizziness or light-headedness   Complete by: As directed    Call MD for:  persistant nausea and vomiting   Complete by: As directed    Call MD for:  severe uncontrolled pain   Complete by: As directed    Diet - low sodium heart healthy   Complete by: As directed    Discharge instructions   Complete by: As directed    It was a pleasure taking care of you at [facility]  You were admitted and treated for heart failure exacerbation.  Your condition has improved and you are stable for discharge.  Please note the following changes to medications:  Start taking Entresto (sacubitril-valsartan) 49-51 mg daily for your heart failure.  Continue doubling up on your Lasix, 2 pills a day, until your follow-up appointment with your cardiologist.  We spoke with you about the importance of wearing your CPAP mask while sleeping, as we noted that the 3 L of oxygen you have been using was not sufficient to keep your oxygen saturations during sleeping at a healthy level.   Continue taking your other medications as prescribed: Lipitor 80 mg daily, Coreg 25 mg twice daily, Plavix 75 mg daily, Eliquis 5 mg twice daily, Jardiance 25 mg daily, metformin 1000 mg twice daily, omeprazole 20 mg daily, Aldactone 25 mg daily, Trulicity 3 mg injected weekly, Tresiba 50 units in the morning daily.  You have a hospital follow-up appointment with a primary care provider on July 18th at 1:15 PM at the [facility] internal medicine center.  You have a follow-up appointment scheduled on July 17 at 1:30 PM at the [facility] heart impact clinic.  You have a follow-up appointment with cardiology on July 24 at [facility].   Increase activity slowly   Complete by:  As directed        SUBJECTIVE:   Patient reports that he feels well today with no shortness of breath or orthopnea.  Swelling is decreased in his lower extremities.  Spoke with the patient and about the importance of using his CPAP mask  while sleeping, as several apneic episodes were noted overnight.  He says he feels like he is back at his baseline.  Discharge Vitals:   BP 130/87 (BP Location: Left Arm)   Pulse 62   Temp (!) 97.2 F (36.2 C) (Axillary)   Resp 18   Ht 5\' 7"  (1.702 m)   Wt 103.7 kg   SpO2 95%   BMI 35.81 kg/m   OBJECTIVE:  Physical Exam Constitutional:      Appearance: Normal appearance.  Cardiovascular:     Rate and Rhythm: Normal rate and regular rhythm.     Pulses: Normal pulses.     Heart sounds: Normal heart sounds.     Comments: Minimal edema in the lower extremities, left greater than right. Pulmonary:     Effort: Pulmonary effort is normal.     Breath sounds: Normal breath sounds.  Neurological:     General: No focal deficit present.     Mental Status: He is alert and oriented to person, place, and time.     Pertinent Labs, Studies, and Procedures:     Latest Ref Rng & Units 10/29/2022   10:37 AM 10/28/2022    9:23 AM 02/07/2021    5:32 AM  CBC  WBC 4.0 - 10.5 K/uL 9.1  9.8    Hemoglobin 13.0 - 17.0 g/dL 16.1  09.6  04.5   Hematocrit 39.0 - 52.0 % 45.7  45.4  47.0   Platelets 150 - 400 K/uL 280  287         Latest Ref Rng & Units 10/31/2022    1:26 AM 10/30/2022   12:26 AM 10/29/2022   10:37 AM  CMP  Glucose 70 - 99 mg/dL 409  811  914   BUN 6 - 20 mg/dL 22  24  15    Creatinine 0.61 - 1.24 mg/dL 7.82  9.56  2.13   Sodium 135 - 145 mmol/L 139  140  139   Potassium 3.5 - 5.1 mmol/L 3.7  3.6  3.7   Chloride 98 - 111 mmol/L 107  104  101   CO2 22 - 32 mmol/L 24  27  28    Calcium 8.9 - 10.3 mg/dL 8.5  8.4  8.4     Signed: Annett Fabian, MD Internal Medicine Resident, PGY-1 [facility]  Pager:  [phone] 1:22 PM, 10/31/2022

## 2022-10-31 NOTE — Progress Notes (Signed)
Heart Failure Stewardship Pharmacist Progress Note   PCP: Default, Provider, MD PCP-Cardiologist: Olga Millers, MD    HPI:  62 yo M with PMH of CAD s/p CABG, CHF, HLD, HTN, obesity, PAD, and T2DM.   Admitted 10/2019 with shortness of breath, LE edema, and fatigue. Cath showed multivessel disease, referred for CABG. ECHO with EF reduced to 30-35%. CABG done 10/29/19.  Readmitted 11/2019 with wound infection from surgery.   Admitted 05/2020 with acute on chronic CHF due to medication noncompliance. BNP 1905. Repeat ECHO showed LVEF reduced further to 25-30%. Followed up with HF TOC clinic on 06/08/20.   ICD placed 10/26/20.  Presented to the ED on 7/7 with shortness of breath, orthopnea, and LE edema. Reports dietary noncompliance but compliance to most medications (states he ran out of carvedilol for a month but has been taking it for the last week). States Dr. Jens Som instructed him to increase his lasix as outpatient with shortness of breath. He did not have improvement in his symptoms. CXR with central venous congestion and bilateral pleural effusions. ECHO 7/8 showed LVEF 25-30% (stable from 05/2020), global hypokinesis, moderate concentric LVH, G2DD, RV mildly reduced.   Current HF Medications: Beta Blocker: carvedilol 25 mg BID ACE/ARB/ARNI: Entresto 49/51 mg BID MRA: spironolactone 25 mg daily SGLT2i: Jardiance 25 mg daily  Prior to admission HF Medications: Diuretic: furosemide 40 mg daily Beta blocker: carvedilol 25 mg BID MRA: spironolactone 25 mg daily SGLT2i: Jardiance 25 mg daily  Pertinent Lab Values: Serum creatinine 1.17, BUN 22, Potassium 3.7, Sodium 139, BNP 796.1, Magnesium 2.1, A1c 9.2   Vital Signs: Weight: 228 lbs (admission weight: 235 lbs) Blood pressure: 130-150/70s  Heart rate: 50-60s  I/O: net -1.6L yesterday; net -4.8L since admission  Medication Assistance / Insurance Benefits Check: Does the patient have prescription insurance?  Yes Type of  insurance plan: [place] Medicaid   Outpatient Pharmacy:  Prior to admission outpatient pharmacy: [facility] Is the patient willing to use [facility] TOC pharmacy at discharge? Yes Is the patient willing to transition their outpatient pharmacy to utilize a [facility] outpatient pharmacy?   No    Assessment: 1. Acute on chronic systolic CHF (LVEF 25-30%), due to ICM. NYHA class III symptoms. - Furosemide 80 mg IV x 1 given yesterday. Did not receive BID dose. Volume status improved. Back down to home 3L O2. Strict I/Os and daily weights.  - Continue carvedilol 25 mg BID - He reports not taking Entresto PTA - he is unsure why this was stopped in the past but he has been off of it for several months. [place] was reduced to 49/51 mg BID due to creatinine bump. With improvement in creatinine, recommend increasing back to 97/103 mg BID. - Continue spironolactone 25 mg daily. He reports that he has gone back and forth with taking 12.5 and 25 mg daily based on potassium levels. Most recently, has been taking 25 mg daily with no issues. - Continue Jardiance 25 mg daily   Plan: 1) Medication changes recommended at this time: - Increase Entresto to 97/103 mg BID  2) Patient assistance: - None pending, has [place] Medicaid  3)  Education  - Patient has been educated on current HF medications and potential additions to HF medication regimen - Patient verbalizes understanding that over the next few months, these medication doses may change and more medications may be added to optimize HF regimen - Patient has been educated on basic disease state pathophysiology and goals of therapy  Sharen Hones, PharmD, BCPS Heart Failure Stewardship Pharmacist Phone [phone]

## 2022-10-31 NOTE — Progress Notes (Signed)
Nurse requested Mobility Specialist to perform oxygen saturation test with pt which includes removing pt from oxygen both at rest and while ambulating.  Below are the results from that testing.     Patient Saturations on Room Air at Rest = spO2 95%  Patient Saturations on Room Air while Ambulating = sp02 85% .   Patient Saturations on 3 Liters of oxygen while Ambulating = sp02 90%  At end of testing pt left in room on 3 Liters of oxygen.  Reported results to nurse.   Addison Lank Mobility Specialist Please contact via SecureChat or  Rehab office at [phone]

## 2022-10-31 NOTE — Progress Notes (Signed)
Explained discharge instructions to patient. Reviewed follow up appointment and next medication administration times. Also reviewed education. Patient verbalized having an understanding for instructions given. All belongings are in the patient's possession to include TOC meds. IV and telemetry were removed. CCMD was notified. No other needs verbalized. Waiting for his wife to come with home oxygen to transport home for discharge.

## 2022-10-31 NOTE — Progress Notes (Signed)
Central Telemetry called me multiple times during the night stating that patient's O2 stats would drop down into the 50s, stay there for 20-30 seconds, then rebound back to baseline.  Then 1 time they called because his O2 stats did not rebound to baseline but stayed at 80. I had to go into patient's and wake him up. Then his O2 stats returned to baseline.

## 2022-10-31 NOTE — Plan of Care (Signed)

## 2022-11-06 ENCOUNTER — Telehealth (HOSPITAL_COMMUNITY): Payer: Self-pay

## 2022-11-06 NOTE — Telephone Encounter (Signed)
Called to confirm Heart & Vascular Transitions of Care appointment. No answer, Left message to return call.

## 2022-11-07 ENCOUNTER — Ambulatory Visit (HOSPITAL_COMMUNITY)
Admit: 2022-11-07 | Discharge: 2022-11-07 | Disposition: A | Discharge status: Home / Self Care | Payer: Medicaid Other | Attending: Family Medicine | Admitting: Family Medicine

## 2022-11-07 ENCOUNTER — Encounter (HOSPITAL_COMMUNITY): Payer: Self-pay

## 2022-11-07 VITALS — BP 102/78 | HR 59 | Wt 227.6 lb

## 2022-11-07 DIAGNOSIS — Z951 Presence of aortocoronary bypass graft: Secondary | ICD-10-CM | POA: Diagnosis not present

## 2022-11-07 DIAGNOSIS — Z7984 Long term (current) use of oral hypoglycemic drugs: Secondary | ICD-10-CM | POA: Diagnosis not present

## 2022-11-07 DIAGNOSIS — I480 Paroxysmal atrial fibrillation: Secondary | ICD-10-CM | POA: Insufficient documentation

## 2022-11-07 DIAGNOSIS — Z87891 Personal history of nicotine dependence: Secondary | ICD-10-CM | POA: Diagnosis not present

## 2022-11-07 DIAGNOSIS — G4733 Obstructive sleep apnea (adult) (pediatric): Secondary | ICD-10-CM

## 2022-11-07 DIAGNOSIS — Z7901 Long term (current) use of anticoagulants: Secondary | ICD-10-CM | POA: Insufficient documentation

## 2022-11-07 DIAGNOSIS — I2510 Atherosclerotic heart disease of native coronary artery without angina pectoris: Secondary | ICD-10-CM | POA: Diagnosis not present

## 2022-11-07 DIAGNOSIS — I5022 Chronic systolic (congestive) heart failure: Secondary | ICD-10-CM | POA: Diagnosis not present

## 2022-11-07 DIAGNOSIS — Z833 Family history of diabetes mellitus: Secondary | ICD-10-CM | POA: Insufficient documentation

## 2022-11-07 DIAGNOSIS — I252 Old myocardial infarction: Secondary | ICD-10-CM | POA: Diagnosis not present

## 2022-11-07 DIAGNOSIS — Z95810 Presence of automatic (implantable) cardiac defibrillator: Secondary | ICD-10-CM | POA: Diagnosis not present

## 2022-11-07 DIAGNOSIS — Z7902 Long term (current) use of antithrombotics/antiplatelets: Secondary | ICD-10-CM | POA: Insufficient documentation

## 2022-11-07 DIAGNOSIS — Z79899 Other long term (current) drug therapy: Secondary | ICD-10-CM | POA: Insufficient documentation

## 2022-11-07 DIAGNOSIS — R9431 Abnormal electrocardiogram [ECG] [EKG]: Secondary | ICD-10-CM | POA: Diagnosis not present

## 2022-11-07 DIAGNOSIS — I255 Ischemic cardiomyopathy: Secondary | ICD-10-CM | POA: Diagnosis not present

## 2022-11-07 DIAGNOSIS — I110 Hypertensive heart disease with heart failure: Secondary | ICD-10-CM | POA: Diagnosis not present

## 2022-11-07 DIAGNOSIS — E1165 Type 2 diabetes mellitus with hyperglycemia: Secondary | ICD-10-CM | POA: Diagnosis not present

## 2022-11-07 DIAGNOSIS — Z794 Long term (current) use of insulin: Secondary | ICD-10-CM | POA: Insufficient documentation

## 2022-11-07 DIAGNOSIS — Z8249 Family history of ischemic heart disease and other diseases of the circulatory system: Secondary | ICD-10-CM | POA: Insufficient documentation

## 2022-11-07 DIAGNOSIS — E785 Hyperlipidemia, unspecified: Secondary | ICD-10-CM | POA: Diagnosis not present

## 2022-11-07 NOTE — Progress Notes (Signed)
HEART IMPACT TRANSITIONS OF CARE    PCP: Dr Jonny Ruiz  Primary Cardiologist: Dr [facility]   HPI: [name] is a 62 y.o.  male  with a PMH significant for HFrEF last EF 30%, CAD status post CABG in 2021, PAF, Prolonged QT interval, HTN, T2DM, ICD Bi[employer],  and Hyperlipidemia    Patient's cardiac history began with an ST elevation myocardial infarction in 2005 with percutaneous intervention to diagonal, right coronary artery totaled.  Later had a Non-ST segment elevation myocardial infarction August 2007 with bare-metal stent to the obtuse marginal 2, at that time his ejection fraction was 35-40%   Admitted for CHF exacerbation on 10/2019. Echo showed severely reduced EF. Cath showed multivessel CAD. S/P CABG x3.    Was out of his medications for months after CABG, says he was never scheduled for follow up with cardiology after his CT surgery appointments and follow up for sternal wound infection.    Had Biotronic ICD  implanted 10/2020  He called cardiology office 09/25/22 and had been out of coreg and lasix. Says he was able to get those filled.    Admitted 10/28/22 with A/C HFrEF suspected in the setting of high sodium diet.He told me he was eating whatever he wanted. Diuresed with IV lasix and transitioned to lasix 40 mg twice a day.    Overall feeling fine. Able to walk around [facility]. Able to walk up 3 steps. Denies SOB/PND/Orthopnea. No chest pain. Tries to follow low salt diet. Appetite ok. No fever or chills. He has not been weighing.  Taking all medications. Disabled. Lives with friend.   Cardiac Test Echo 10/2022  Left ventricular ejection fraction, by estimation, is 25 to 30%. The  left ventricle has severely decreased function. The left ventricle  demonstrates global hypokinesis with peri-apical akinesis. There is  moderate concentric left ventricular  hypertrophy. Left ventricular diastolic parameters are consistent with  Grade II diastolic  dysfunction (pseudonormalization).   2. Right ventricular systolic function is mildly reduced. The right  ventricular size is normal. There is normal pulmonary artery systolic  pressure. The estimated right ventricular systolic pressure is 28.2 mmHg.   3. Left atrial size was mildly dilated.   4. Right atrial size was mildly dilated.   5. The mitral valve is normal in structure. No evidence of mitral valve  regurgitation. No evidence of mitral stenosis.   6. The aortic valve is tricuspid. Aortic valve regurgitation is not  visualized. No aortic stenosis is present.   Echo 2021  ft ventricular ejection fraction, by estimation, is 30 to 35%. The  left ventricle has moderately decreased function. The left ventricle  demonstrates regional wall motion abnormalities (see scoring  diagram/findings for description). There is  moderate left ventricular hypertrophy. Left ventricular diastolic  parameters are consistent with Grade III diastolic dysfunction  (restrictive). Suggest Definity contrast study to more effectively exclude  apical thrombus - acoustic shadowing and false  tendon noted.   2. Right ventricular systolic function is mildly reduced. The right  ventricular size is normal. There is moderately elevated pulmonary artery  systolic pressure. The estimated right ventricular systolic pressure is  48.6 mmHg.   3. Left atrial size was mild to moderately dilated.   4. Right atrial size was mild to moderately dilated.   5. The mitral valve is abnormal, mildly thickened and with annular  calcification. Mild mitral valve regurgitation.   6. The tricuspid valve is abnormal.   7. The aortic valve  is tricuspid. Aortic valve regurgitation is trivial.   Cath 2021  Prox RCA lesion is 100% stenosed. 2nd Mrg lesion is 80% stenosed. Prox Cx to Mid Cx lesion is 80% stenosed. Ramus lesion is 70% stenosed. 2nd Diag lesion is 90% stenosed. Mid LAD lesion is 99% stenosed.  1. Severe triple vessel  CAD 2. Severe stenosis mid LAD 3. Severe stenosis moderate caliber Diagonal branch 4. Severe stenosis mid Circumflex into the stented segment of the OM branch 5. Severe stenosis moderate caliber intermediate branch 6. Chronic occlusion proximal RCA. The distal vessel fills from right to right and left to right collaterals.  7. Elevated filling pressures Referred to CT surgery and underwent CABG. .  ROS: All systems negative except as listed in HPI, PMH and Problem List.  SH:  Social History   Socioeconomic History   Marital status: Legally Separated    Spouse name: Not on file   Number of children: 3   Years of education: Not on file   Highest education level: 11th grade  Occupational History    Comment: Unemployed   Occupation: disability  Tobacco Use   Smoking status: Former    Current packs/day: 0.00    Average packs/day: 0.2 packs/day for 43.5 years (10.9 ttl pk-yrs)    Types: Cigarettes    Start date: 80    Quit date: 10/23/2019    Years since quitting: 3.0    Passive exposure: Never   Smokeless tobacco: Never   Tobacco comments:    1/4 pack per day  Vaping Use   Vaping status: Never Used  Substance and Sexual Activity   Alcohol use: No    Alcohol/week: 0.0 standard drinks of alcohol   Drug use: No   Sexual activity: Not on file  Other Topics Concern   Not on file  Social History Narrative   Not on file   Social Determinants of Health   Financial Resource Strain: Low Risk  (10/30/2022)   Overall Financial Resource Strain (CARDIA)    Difficulty of Paying Living Expenses: Not very hard  Food Insecurity: Low Risk  (11/01/2022)   Received from [facility]   Food vital sign    Within the past 12 months, you worried that your food would run out before you got money to buy more: Never true    Within the past 12 months, the food you bought just didn't last and you didn't have money to get more. : Never true  Transportation Needs: Not on file (11/01/2022)  Physical  Activity: Insufficiently Active (10/29/2019)   Exercise Vital Sign    Days of Exercise per Week: 1 day    Minutes of Exercise per Session: 30 min  Stress: Not on file  Social Connections: Socially Isolated (10/29/2019)   Social Connection and Isolation Panel [NHANES]    Frequency of Communication with Friends and Family: More than three times a week    Frequency of Social Gatherings with Friends and Family: More than three times a week    Attends Religious Services: Never    Database administrator or Organizations: No    Attends Banker Meetings: Never    Marital Status: Divorced  Catering manager Violence: Not At Risk (10/28/2022)   Humiliation, Afraid, Rape, and Kick questionnaire    Fear of Current or Ex-Partner: No    Emotionally Abused: No    Physically Abused: No    Sexually Abused: No    FH:  Family History  Problem Relation Age  of Onset   Diabetes Father    Kidney failure Father    Hypertension Father    Hyperlipidemia Father    Heart disease Father     Past Medical History:  Diagnosis Date   AICD (automatic cardioverter/defibrillator) present    Anginal pain (HCC)    Anxiety    CAD (coronary artery disease)    CHF (congestive heart failure) (HCC)    Chronic back pain    Colon polyps    Diabetes mellitus    Heart attack (HCC)    Hyperlipidemia    Hypertension    Neuropathy    Panic attacks    PVD (peripheral vascular disease) (HCC)    Sleep apnea     Current Outpatient Medications  Medication Sig Dispense Refill   ACCU-CHEK GUIDE test strip      atorvastatin (LIPITOR) 80 MG tablet Take 1 tablet (80 mg total) by mouth daily. 90 tablet 3   carvedilol (COREG) 25 MG tablet Take 1 tablet (25 mg total) by mouth 2 (two) times daily with a meal. 180 tablet 0   clopidogrel (PLAVIX) 75 MG tablet Take 1 tablet (75 mg total) by mouth daily. 30 tablet 11   ELIQUIS 5 MG TABS tablet TAKE 1 TABLET BY MOUTH TWICE DAILY 60 tablet 5   empagliflozin (JARDIANCE) 25  MG TABS tablet Take 25 mg by mouth daily.     furosemide (LASIX) 40 MG tablet Take 1 tablet (40 mg total) by mouth 2 (two) times daily for 14 days then take 1 tablet once daily. 90 tablet 1   metFORMIN (GLUCOPHAGE) 1000 MG tablet Take 1 tablet (1,000 mg total) by mouth 2 (two) times daily. 60 tablet 0   omeprazole (PRILOSEC OTC) 20 MG tablet Take 20 mg by mouth daily as needed (for reflux).     OXYGEN Inhale 3 L/min into the lungs at bedtime as needed (for shortness of breath).     sacubitril-valsartan (ENTRESTO) 49-51 MG Take 1 tablet by mouth 2 (two) times daily. 60 tablet 0   spironolactone (ALDACTONE) 25 MG tablet Take 1 tablet (25 mg total) by mouth daily. 30 tablet 0   TRESIBA FLEXTOUCH 100 UNIT/ML FlexTouch Pen Inject 50 Units into the skin in the morning.     TRULICITY 3 MG/0.5ML SOPN Inject 3 mg into the skin every Wednesday.     No current facility-administered medications for this encounter.    Vitals:   11/07/22 1331  BP: 102/78  Pulse: (!) 59  SpO2: 96%  Weight: 103.2 kg (227 lb 9.6 oz)   Wt Readings from Last 3 Encounters:  11/07/22 103.2 kg (227 lb 9.6 oz)  10/31/22 103.7 kg (228 lb 9.9 oz)  06/26/22 105 kg (231 lb 8 oz)    PHYSICAL EXAM: General:  Well appearing. No resp difficulty. Walked in the clinic.  HEENT: normal Neck: supple. JVP flat. Carotids 2+ bilaterally; no bruits. No lymphadenopathy or thryomegaly appreciated. Cor: PMI normal. Regular rate & rhythm. No rubs, gallops or murmurs. Lungs: clear Abdomen: soft, nontender, nondistended. No hepatosplenomegaly. No bruits or masses. Good bowel sounds. Extremities: no cyanosis, clubbing, rash, edema Neuro: alert & orientedx3, cranial nerves grossly intact. Moves all 4 extremities w/o difficulty. Affect pleasant.   ECG: SR 63 bpm    ASSESSMENT & PLAN:  1. Chronic HFrEF, ICM  ICD, [employer]  Echo 10/2022- EF 25-30% Grade II DD RV mildly reduced . EF has been ~ 25-30% for  > 3 years.  -  NYHA  II. - Looks pretty good today. Volume status stable. Continue lasix 40 mg twice a day  -Continue coreg 25 mg twice a day .  -Continue spironolactone 25 mg daily  -Continue entresto 49-51 mg twice a day. Would not increase with soft SBP.  - Continue jardiance 25 mg daily  - Discussed low salt food choices and limiting fluid intake to < 2 liters per day.  - Encourage to start walking daily,   2. OSA -He no longer uses CPAP. Says he lost it over a year ago.  -May need another sleep study. Will need PCP to reorder another sleep study/ .   3. PAF -EKG today -- In SR today.  -Continue coreg 25 mg twice a day  -Continue eliquis 5 mg twice a day   -No bleeding issues   4. DMII uncontrolled Hgb A1C 9.2   5. CAD  S/P CABG on plavix + statin + bb - No chest pain.   I personally reviewed all medications bottles to verify what he is taking. He did not have jardiance listed but he has been taking jardiance 25 mg daily. Updated medication list.   He would like to get BMET at his PCP follow up.   He is established with Dr Jens Som and has follow up. He prefers to follow with him but in the future if he compensates HF Team would be happy to see him again.   Follow up as needed.  Nayely Dingus NP-C  2:59 PM

## 2022-11-07 NOTE — Patient Instructions (Signed)
Medication Changes:  No Changes In Medications at this time.   Lab Work:  PLEASE GET BMET (BLOOD WORK WITH YOUR PRIMARY CARE OR PRIMARY CARDIOLOGIST)  Follow-Up in: AS NEEDED   At the [facility], you and your health needs are our priority. We have a designated team specialized in the treatment of Heart Failure. This Care Team includes your primary Heart Failure Specialized Cardiologist (physician), Advanced Practice Providers (APPs- Physician Assistants and Nurse Practitioners), and Pharmacist who all work together to provide you with the care you need, when you need it.   You may see any of the following providers on your designated Care Team at your next follow up:  Dr. Arvilla Meres Dr. Marca Ancona Dr. Marcos Eke, NP Robbie Lis, [place] [facility] [place], [place] Brynda Peon, NP Karle Plumber, PharmD   Please be sure to bring in all your medications bottles to every appointment.   Need to Contact us:  If you have any questions or concerns before your next appointment please send [place] a message through [place] or call our office at [phone].    TO LEAVE A MESSAGE FOR THE NURSE SELECT OPTION 2, PLEASE LEAVE A MESSAGE INCLUDING: YOUR NAME DATE OF BIRTH CALL BACK NUMBER REASON FOR CALL**this is important as we prioritize the call backs  YOU WILL RECEIVE A CALL BACK THE SAME DAY AS LONG AS YOU CALL BEFORE 4:00 PM

## 2022-11-08 ENCOUNTER — Ambulatory Visit (INDEPENDENT_AMBULATORY_CARE_PROVIDER_SITE_OTHER): Payer: Medicaid Other | Admitting: Student

## 2022-11-08 ENCOUNTER — Encounter (HOSPITAL_COMMUNITY): Payer: Medicaid Other

## 2022-11-08 ENCOUNTER — Other Ambulatory Visit: Payer: Self-pay

## 2022-11-08 ENCOUNTER — Encounter: Payer: Self-pay | Admitting: Student

## 2022-11-08 VITALS — BP 102/69 | HR 61 | Temp 97.8°F | Resp 24 | Ht 67.0 in | Wt 228.4 lb

## 2022-11-08 DIAGNOSIS — E1169 Type 2 diabetes mellitus with other specified complication: Secondary | ICD-10-CM

## 2022-11-08 DIAGNOSIS — I509 Heart failure, unspecified: Secondary | ICD-10-CM | POA: Diagnosis present

## 2022-11-08 DIAGNOSIS — Z7984 Long term (current) use of oral hypoglycemic drugs: Secondary | ICD-10-CM

## 2022-11-08 DIAGNOSIS — Z87891 Personal history of nicotine dependence: Secondary | ICD-10-CM

## 2022-11-08 DIAGNOSIS — Z794 Long term (current) use of insulin: Secondary | ICD-10-CM | POA: Diagnosis not present

## 2022-11-08 NOTE — Assessment & Plan Note (Addendum)
Patient has a history of heart failure presenting to the office for recent hospital follow-up for which he was managed with heart failure exacerbation, started on Entresto and his Lasix was increased.  Denies shortness of breath or chest pain at this time.  Says he is doing quite very well after discharge.Didn't appreciate any swelling in the lower extremities bilaterally.  Because his Lasix was increased, will consider rechecking his BMP for any electrolyte abnormality. -Order BMP

## 2022-11-08 NOTE — Patient Instructions (Signed)
Thank you, [name] for allowing [place] to provide your care today. Today we discussed we discussed your general health and your recent hospitalization/ We will order a blood work today  I will call you when the results comes back     I have ordered the following labs for you:  Lab Orders         BMP8+Anion Gap       Tests ordered today:   Referrals ordered today:   Referral Orders  No referral(s) requested today     I have ordered the following medication/changed the following medications:   Stop the following medications: There are no discontinued medications.   Start the following medications: No orders of the defined types were placed in this encounter.   Follow up: 2 months   Remember:   Should you have any questions or concerns please call the internal medicine clinic at [phone].    [name], M.D [facility]

## 2022-11-08 NOTE — Assessment & Plan Note (Addendum)
Long history of diabetes, hx of medication noncompliance, last A1c check was 9.2 (Last week).  Currently being managed with metformin ,[name] ,Jardiance and Trulicity.  Denies polyuria and polydipsia at this time, denies tingling sensation changes in vision or any weight loss. -Continue patient on the same regimen and recheck A1c in 2 to 3 months

## 2022-11-08 NOTE — Progress Notes (Signed)
CC: Recent hospitalization follow-up  HPI:  [name] is a 62 y.o. male living with a history stated below and presents today for recent hospitalization follow-up. Please see problem based assessment and plan for additional details.  Past Medical History:  Diagnosis Date   AICD (automatic cardioverter/defibrillator) present    Anginal pain (HCC)    Anxiety    CAD (coronary artery disease)    CHF (congestive heart failure) (HCC)    Chronic back pain    Colon polyps    Diabetes mellitus    Heart attack (HCC)    Hyperlipidemia    Hypertension    Neuropathy    Panic attacks    PVD (peripheral vascular disease) (HCC)    Sleep apnea     Current Outpatient Medications on File Prior to Visit  Medication Sig Dispense Refill   ACCU-CHEK GUIDE test strip      atorvastatin (LIPITOR) 80 MG tablet Take 1 tablet (80 mg total) by mouth daily. 90 tablet 3   carvedilol (COREG) 25 MG tablet Take 1 tablet (25 mg total) by mouth 2 (two) times daily with a meal. 180 tablet 0   clopidogrel (PLAVIX) 75 MG tablet Take 1 tablet (75 mg total) by mouth daily. 30 tablet 11   ELIQUIS 5 MG TABS tablet TAKE 1 TABLET BY MOUTH TWICE DAILY 60 tablet 5   empagliflozin (JARDIANCE) 25 MG TABS tablet Take 25 mg by mouth daily.     furosemide (LASIX) 40 MG tablet Take 1 tablet (40 mg total) by mouth 2 (two) times daily for 14 days then take 1 tablet once daily. 90 tablet 1   metFORMIN (GLUCOPHAGE) 1000 MG tablet Take 1 tablet (1,000 mg total) by mouth 2 (two) times daily. 60 tablet 0   omeprazole (PRILOSEC OTC) 20 MG tablet Take 20 mg by mouth daily as needed (for reflux).     OXYGEN Inhale 3 L/min into the lungs at bedtime as needed (for shortness of breath).     sacubitril-valsartan (ENTRESTO) 49-51 MG Take 1 tablet by mouth 2 (two) times daily. 60 tablet 0   spironolactone (ALDACTONE) 25 MG tablet Take 1 tablet (25 mg total) by mouth daily. 30 tablet 0   TRESIBA FLEXTOUCH 100 UNIT/ML FlexTouch Pen  Inject 50 Units into the skin in the morning.     TRULICITY 3 MG/0.5ML SOPN Inject 3 mg into the skin every Wednesday.     No current facility-administered medications on file prior to visit.    Family History  Problem Relation Age of Onset   Diabetes Father    Kidney failure Father    Hypertension Father    Hyperlipidemia Father    Heart disease Father     Social History   Socioeconomic History   Marital status: Legally Separated    Spouse name: Not on file   Number of children: 3   Years of education: Not on file   Highest education level: 11th grade  Occupational History    Comment: Unemployed   Occupation: disability  Tobacco Use   Smoking status: Former    Current packs/day: 0.00    Average packs/day: 0.2 packs/day for 43.5 years (10.9 ttl pk-yrs)    Types: Cigarettes    Start date: 76    Quit date: 10/23/2019    Years since quitting: 3.0    Passive exposure: Never   Smokeless tobacco: Never   Tobacco comments:    1/4 pack per day. Stopped 3 years ago  Vaping  Use   Vaping status: Never Used  Substance and Sexual Activity   Alcohol use: No    Alcohol/week: 0.0 standard drinks of alcohol   Drug use: No   Sexual activity: Not on file  Other Topics Concern   Not on file  Social History Narrative   Not on file   Social Determinants of Health   Financial Resource Strain: Low Risk  (10/30/2022)   Overall Financial Resource Strain (CARDIA)    Difficulty of Paying Living Expenses: Not very hard  Food Insecurity: Low Risk  (11/01/2022)   Received from [facility]   Food vital sign    Within the past 12 months, you worried that your food would run out before you got money to buy more: Never true    Within the past 12 months, the food you bought just didn't last and you didn't have money to get more. : Never true  Transportation Needs: Not on file (11/01/2022)  Physical Activity: Insufficiently Active (10/29/2019)   Exercise Vital Sign    Days of Exercise per Week:  1 day    Minutes of Exercise per Session: 30 min  Stress: Not on file  Social Connections: Socially Isolated (10/29/2019)   Social Connection and Isolation Panel [NHANES]    Frequency of Communication with Friends and Family: More than three times a week    Frequency of Social Gatherings with Friends and Family: More than three times a week    Attends Religious Services: Never    Database administrator or Organizations: No    Attends Banker Meetings: Never    Marital Status: Divorced  Catering manager Violence: Not At Risk (10/28/2022)   Humiliation, Afraid, Rape, and Kick questionnaire    Fear of Current or Ex-Partner: No    Emotionally Abused: No    Physically Abused: No    Sexually Abused: No    Review of Systems: ROS negative except for what is noted on the assessment and plan.  Vitals:   11/08/22 1320 11/08/22 1326  BP: 91/66 102/69  Pulse: (!) 59 61  Resp: (!) 24   Temp: 97.8 F (36.6 C)   TempSrc: Oral   SpO2: 94%   Weight: 228 lb 6.4 oz (103.6 kg)   Height: 5\' 7"  (1.702 m)     Physical Exam: Constitutional: Obese man , sitting in the chair, no acute distress Eyes: conjunctiva non-erythematous Cardiovascular: Normal heart sounds, no crackles  Pulmonary/Chest: lungs clear to auscultation bilaterally,no wheezing  Abdominal: Protruded abdomen Psych: normal mood and behavior  Assessment & Plan:   Acute exacerbation of CHF (congestive heart failure) (HCC) Patient has a history of heart failure presenting to the office for recent hospital follow-up for which he was managed with heart failure exacerbation, started on Entresto and his Lasix was increased.  Denies shortness of breath or chest pain at this time.  Says he is doing quite very well after discharge.Didn't appreciate any swelling in the lower extremities bilaterally.  Because his Lasix was increased, will consider rechecking his BMP for any electrolyte abnormality. -Order BMP  Diabetes mellitus  (HCC) Long history of diabetes, hx of medication noncompliance, last A1c check was 9.2 (Last week).  Currently being managed with metformin ,Evaristo Bury ,Jardiance and Trulicity.  Denies polyuria and polydipsia at this time, denies tingling sensation changes in vision or any weight loss. -Continue patient on the same regimen and recheck A1c in 2 to 3 months   Patient seen with Dr. Cleda Daub  Kathleen Lime, M.D [facility] Phone: [phone] Date 11/08/2022 Time 5:37 PM

## 2022-11-09 LAB — BMP8+ANION GAP
Anion Gap: 21 mmol/L — ABNORMAL HIGH (ref 10.0–18.0)
BUN/Creatinine Ratio: 20 (ref 10–24)
BUN: 26 mg/dL (ref 8–27)
CO2: 24 mmol/L (ref 20–29)
Calcium: 10.2 mg/dL (ref 8.6–10.2)
Chloride: 93 mmol/L — ABNORMAL LOW (ref 96–106)
Creatinine, Ser: 1.27 mg/dL (ref 0.76–1.27)
Glucose: 157 mg/dL — ABNORMAL HIGH (ref 70–99)
Potassium: 4.8 mmol/L (ref 3.5–5.2)
Sodium: 138 mmol/L (ref 134–144)
eGFR: 65 mL/min/{1.73_m2} (ref >59)

## 2022-11-12 NOTE — Progress Notes (Signed)
Remote ICD transmission.   

## 2022-11-13 NOTE — Progress Notes (Signed)
Internal Medicine Clinic Attending  I was physically present during the key portions of the resident provided service and participated in the medical decision making of patient's management care. I reviewed pertinent patient test results.  The assessment, diagnosis, and plan were formulated together and I agree with the documentation in the resident's note.  Gust Rung, DO

## 2022-11-14 ENCOUNTER — Ambulatory Visit: Payer: Medicaid Other | Attending: Physician Assistant | Admitting: Physician Assistant

## 2022-11-14 VITALS — BP 108/64 | HR 54 | Ht 67.0 in | Wt 230.2 lb

## 2022-11-14 DIAGNOSIS — Z95810 Presence of automatic (implantable) cardiac defibrillator: Secondary | ICD-10-CM | POA: Diagnosis not present

## 2022-11-14 DIAGNOSIS — I5022 Chronic systolic (congestive) heart failure: Secondary | ICD-10-CM

## 2022-11-14 DIAGNOSIS — I25810 Atherosclerosis of coronary artery bypass graft(s) without angina pectoris: Secondary | ICD-10-CM

## 2022-11-14 DIAGNOSIS — I480 Paroxysmal atrial fibrillation: Secondary | ICD-10-CM

## 2022-11-14 DIAGNOSIS — I10 Essential (primary) hypertension: Secondary | ICD-10-CM

## 2022-11-14 DIAGNOSIS — E785 Hyperlipidemia, unspecified: Secondary | ICD-10-CM

## 2022-11-14 DIAGNOSIS — I739 Peripheral vascular disease, unspecified: Secondary | ICD-10-CM

## 2022-11-14 NOTE — Progress Notes (Signed)
Cardiology Office Note:  .   Date:  11/14/2022  ID:  [name], DOB 01-Feb-1961, MRN [id number] PCP: Lee Lime, MD  [facility] Providers Cardiologist:  Lee Millers, MD     History of Present Illness: [name] Kitchen   [name] is a 62 y.o. male with past medical history of CAD s/p CABG with LIMA-LAD and RIMA-PDA in July 2021, postop atrial fibrillation, chronic systolic heart failure, ICM s/p Biotronik ICD 10/2018, carotid artery disease, PAD, hypertension and hyperlipidemia.  He had ST elevated MI in 2005 treated with percutaneous intervention of diagonal, RCA was totally occluded.  He also had NSTEMI in 2007 requiring bare-metal stent to OM 2.  EF was 35 to 40% the time.  He was admitted with acute CHF exacerbation in July 2021.  Echocardiogram showed severely reduced EF.  Subsequent cardiac catheterization showed three-vessel CAD that led to bypass surgery in July 2021.  Postop course complicated by atrial fibrillation.  Echocardiogram in February 2022 showed EF 25 to 30%, LVH, restrictive filling, moderate RV dysfunction, severe LAE.  Patient had ICD implanted in July 2022.  Carotid Doppler showed patent stent in the left and the right carotid artery.  Last lower extremity angiography by Dr. Myra [name] was in October 2022.  Lower extremity Doppler in July 2023 showed a 50 to 99% distal right popliteal artery on the right and 50 to 99% distal popliteal artery stenosis on the left.  ABI in June 2023 showed mild disease on the right and a abnormal TBI on the left.  Peripheral artery disease followed by vascular surgery Dr. Myra [name].  He was last seen by Dr. Jens [name] in September 2023 at which time he was doing well.  He was resumed on spironolactone 12.5 mg daily.  He had 1 episode of hyperkalemia in the past, however none since.  Lipid panel obtained at the time showed uncontrolled triglyceride, Lipitor was increased to 80 mg daily.  More recently, he contacted cardiology service in June  2024 after out of Lasix and carvedilol.  He was admitted in July 2024 showed acute on chronic systolic heart failure suspected in the setting of high sodium diet.  He underwent IV diuresis and discharged on Lasix 40 mg twice a day.  Echocardiogram obtained during that admission showed EF of 25 to 30%, global hypokinesis with periapical akinesis, moderate LVH, grade 2 DD, RV systolic pressure 28.2 mmHg, no significant mitral and aortic valve issue.  Since discharge, patient has been seen by heart failure TOC clinic on 11/07/2022 at which time he was doing well.  His heart failure medications including carvedilol, spironolactone, Entresto and Jardiance.  He was no longer using CPAP machine, it was recommended he has another sleep study.  Patient presents today for follow-up.  He has no lower extremity edema, orthopnea or PND.  He is functionally independent.  He denies any chest pain or worsening dyspnea.  Blood work obtained by PCP on 11/08/2022 showed a stable renal function and electrolyte.  He is aware to monitor his weight and call us if he is weight increased by more than 3 pounds overnight or 5 pounds in a single week.  He can follow-up with Dr. Royann [name] in 4 to 5 months.  He is not interested to have another sleep study.  ROS:   He denies chest pain, palpitations, dyspnea, pnd, orthopnea, n, v, dizziness, syncope, edema, weight gain, or early satiety. All other systems reviewed and are otherwise negative except as noted above.  Studies Reviewed: .        Cardiac Studies & Procedures   CARDIAC CATHETERIZATION  CARDIAC CATHETERIZATION 10/28/2019  Narrative  Prox RCA lesion is 100% stenosed.  2nd Mrg lesion is 80% stenosed.  Prox Cx to Mid Cx lesion is 80% stenosed.  Ramus lesion is 70% stenosed.  2nd Diag lesion is 90% stenosed.  Mid LAD lesion is 99% stenosed.  1. Severe triple vessel CAD 2. Severe stenosis mid LAD 3. Severe stenosis moderate caliber Diagonal branch 4. Severe  stenosis mid Circumflex into the stented segment of the OM branch 5. Severe stenosis moderate caliber intermediate branch 6. Chronic occlusion proximal RCA. The distal vessel fills from right to right and left to right collaterals. 7. Elevated filling pressures.  Will resume IV lasix. Will consult CT surgery for CABG.  Findings Coronary Findings Diagnostic  Dominance: Right  Left Anterior Descending Vessel is large. Mid LAD lesion is 99% stenosed.  Second Diagonal Branch Vessel is moderate in size. 2nd Diag lesion is 90% stenosed.  Ramus Intermedius Vessel is moderate in size. Ramus lesion is 70% stenosed.  Left Circumflex Vessel is large. Prox Cx to Mid Cx lesion is 80% stenosed.  Second Obtuse Marginal Branch Vessel is large in size. 2nd Mrg lesion is 80% stenosed. The lesion was previously treated using a stent (unknown type) over 2 years ago.  Right Coronary Artery Vessel is large. Prox RCA lesion is 100% stenosed. The lesion is chronically occluded.  Right Posterior Descending Artery  Right Posterior Atrioventricular Artery  Third Right Posterolateral Branch Collaterals 3rd RPL filled by collaterals from 2nd Sept.  Intervention  No interventions have been documented.     ECHOCARDIOGRAM  ECHOCARDIOGRAM COMPLETE 10/29/2022  Narrative ECHOCARDIOGRAM REPORT    Patient Name:   [name] Date of Exam: 10/29/2022 Medical Rec #:  [id number]         Height:       67.0 in Accession #:    [id number]        Weight:       239.9 lb Date of Birth:  12-Aug-1960         BSA:          2.185 m Patient Age:    62 years          BP:           145/77 mmHg Patient Gender: M                 HR:           55 bpm. Exam Location:  Inpatient  Procedure: 2D Echo, Color Doppler and Cardiac Doppler  Indications:    I50.21 Acute systolic (congestive) heart failure  History:        Patient has prior history of Echocardiogram examinations, most recent 06/04/2020. CHF, CAD,  Defibrillator, Arrythmias:Atrial Fibrillation; Risk Factors:Hypertension, Diabetes, Dyslipidemia and Sleep Apnea.  Sonographer:    [name] Senior RDCS Referring Phys: [phone] [facility] [name]   Sonographer Comments: Technically difficult study due to pectus excavatum. IMPRESSIONS   1. Left ventricular ejection fraction, by estimation, is 25 to 30%. The left ventricle has severely decreased function. The left ventricle demonstrates global hypokinesis with peri-apical akinesis. There is moderate concentric left ventricular hypertrophy. Left ventricular diastolic parameters are consistent with Grade II diastolic dysfunction (pseudonormalization). 2. Right ventricular systolic function is mildly reduced. The right ventricular size is normal. There is normal pulmonary artery systolic pressure. The estimated right ventricular systolic pressure is 28.2  mmHg. 3. Left atrial size was mildly dilated. 4. Right atrial size was mildly dilated. 5. The mitral valve is normal in structure. No evidence of mitral valve regurgitation. No evidence of mitral stenosis. 6. The aortic valve is tricuspid. Aortic valve regurgitation is not visualized. No aortic stenosis is present. 7. The inferior vena cava is normal in size with greater than 50% respiratory variability, suggesting right atrial pressure of 3 mmHg. 8. Technically difficult study with poor acoustic windows.  FINDINGS Left Ventricle: Left ventricular ejection fraction, by estimation, is 25 to 30%. The left ventricle has severely decreased function. The left ventricle demonstrates global hypokinesis. The left ventricular internal cavity size was normal in size. There is moderate concentric left ventricular hypertrophy. Left ventricular diastolic parameters are consistent with Grade II diastolic dysfunction (pseudonormalization).  Right Ventricle: The right ventricular size is normal. No increase in right ventricular wall thickness. Right ventricular  systolic function is mildly reduced. There is normal pulmonary artery systolic pressure. The tricuspid regurgitant velocity is 2.51 m/s, and with an assumed right atrial pressure of 3 mmHg, the estimated right ventricular systolic pressure is 28.2 mmHg.  Left Atrium: Left atrial size was mildly dilated.  Right Atrium: Right atrial size was mildly dilated.  Pericardium: There is no evidence of pericardial effusion.  Mitral Valve: The mitral valve is normal in structure. Mild mitral annular calcification. No evidence of mitral valve regurgitation. No evidence of mitral valve stenosis.  Tricuspid Valve: The tricuspid valve is normal in structure. Tricuspid valve regurgitation is trivial.  Aortic Valve: The aortic valve is tricuspid. Aortic valve regurgitation is not visualized. No aortic stenosis is present.  Pulmonic Valve: The pulmonic valve was normal in structure. Pulmonic valve regurgitation is not visualized.  Aorta: The aortic root is normal in size and structure.  Venous: The inferior vena cava is normal in size with greater than 50% respiratory variability, suggesting right atrial pressure of 3 mmHg.  IAS/Shunts: No atrial level shunt detected by color flow Doppler.  Additional Comments: A device lead is visualized in the right ventricle.   LEFT VENTRICLE PLAX 2D LVIDd:         4.70 cm LVIDs:         3.60 cm LV PW:         1.30 cm LV IVS:        1.40 cm LVOT diam:     2.00 cm LV SV:         54 LV SV Index:   25 LVOT Area:     3.14 cm  LV Volumes (MOD) LV vol d, MOD A2C: 123.0 ml LV vol d, MOD A4C: 198.0 ml LV vol s, MOD A2C: 80.6 ml LV vol s, MOD A4C: 122.0 ml LV SV MOD A2C:     42.4 ml LV SV MOD A4C:     198.0 ml LV SV MOD BP:      57.9 ml  RIGHT VENTRICLE RV S prime:     8.27 cm/s TAPSE (M-mode): 1.5 cm  LEFT ATRIUM             Index        RIGHT ATRIUM           Index LA diam:        5.00 cm 2.29 cm/m   RA Area:     19.80 cm LA Vol (A2C):   60.5 ml  27.69 ml/m  RA Volume:   52.40 ml  23.99 ml/m LA Vol (A4C):  69.5 ml 31.81 ml/m LA Biplane Vol: 66.4 ml 30.39 ml/m AORTIC VALVE LVOT Vmax:   94.17 cm/s LVOT Vmean:  56.833 cm/s LVOT VTI:    0.173 m  AORTA Ao Root diam: 3.20 cm Ao Asc diam:  3.60 cm  TRICUSPID VALVE TR Peak grad:   25.2 mmHg TR Vmax:        251.00 cm/s  SHUNTS Systemic VTI:  0.17 m Systemic Diam: 2.00 cm  Dalton McleanMD Electronically signed by Wilfred Lacy Signature Date/Time: 10/29/2022/11:31:50 AM    Final   TEE  ECHO INTRAOPERATIVE TEE 10/29/2019  Narrative *INTRAOPERATIVE TRANSESOPHAGEAL REPORT *    Patient Name:   AMARIEN CARNE Horlacher Date of Exam: 10/29/2019 Medical Rec #:  [id number]         Height:       67.0 in Accession #:    [id number]        Weight:       180.6 lb Date of Birth:  Dec 30, 1960         BSA:          1.94 m Patient Age:    59 years          BP:           165/92 mmHg Patient Gender: M                 HR:           63 bpm. Exam Location:  Anesthesiology  Transesophogeal exam was perform intraoperatively during surgical procedure. Patient was closely monitored under general anesthesia during the entirety of examination.  Indications:     Coronary artery disease Performing Phys: [id number] Ephriam Knuckles Z ATKINS Diagnosing Phys: Marcene Duos MD  Complications: No known complications during this procedure. POST-OP IMPRESSIONS - Left Ventricle: has severely reduced systolic function, with an ejection fraction of 25-30%. - Right Ventricle: The right ventricle appears unchanged from pre-bypass. - Aorta: The aorta appears unchanged from pre-bypass. - Left Atrium: The left atrium appears unchanged from pre-bypass. - Left Atrial Appendage: The left atrial appendage appears unchanged from pre-bypass. - Aortic Valve: The aortic valve appears unchanged from pre-bypass. - Mitral Valve: The mitral valve appears unchanged from pre-bypass. - Tricuspid Valve: The tricuspid valve appears  unchanged from pre-bypass. - Interatrial Septum: The interatrial septum appears unchanged from pre-bypass. - Interventricular Septum: The interventricular septum appears unchanged from pre-bypass. - Pericardium: The pericardium appears unchanged from pre-bypass.  PRE-OP FINDINGS Left Ventricle: The left ventricle has moderate-severely reduced systolic function, with an ejection fraction of 30-35%. The cavity size was mildly dilated. There is moderate concentric left ventricular hypertrophy. Left ventricular diffuse hypokinesis.  Right Ventricle: The right ventricle has mildly reduced systolic function. The cavity was moderately enlarged. There is no increase in right ventricular wall thickness.  Left Atrium: Left atrial size was dilated.  Right Atrium:  Interatrial Septum: No atrial level shunt detected by color flow Doppler.  Pericardium: There is no evidence of pericardial effusion.  Mitral Valve: The mitral valve is normal in structure. Mild thickening of the mitral valve leaflet. Mitral valve regurgitation is not visualized by color flow Doppler. There is No evidence of mitral stenosis.  Tricuspid Valve: The tricuspid valve was normal in structure. Tricuspid valve regurgitation is mild-moderate by color flow Doppler. The jet is directed eccentrically. The tricuspid valve is mildly thickened.  Aortic Valve: The aortic valve is tricuspid There is mild thickening of the aortic valve Aortic valve regurgitation was not visualized by color flow  Doppler. There is no stenosis of the aortic valve.  Pulmonic Valve: The pulmonic valve was normal in structure. Pulmonic valve regurgitation is not visualized by color flow Doppler.   Aorta: There is evidence of plaque in the descending aorta.  +--------------+--------++ LEFT VENTRICLE         +--------------+--------++ PLAX 2D                +--------------+--------++ +------------+-------------++ LVIDd:        5.80 cm  3D  Volume EF              +--------------+--------++ +------------+-------------++ LVIDs:        4.80 cm  LV 3D EF:   6.40 %        +--------------+--------++ +------------+-------------++ LVOT diam:    2.10 cm  LV 3D EDV:  135300.00 mm +--------------+--------++ +------------+-------------++ LV SV:        59 ml    LV 3D ESV:  126600.00 mm +--------------+--------++ +------------+-------------++ LV SV Index:  29.71    LV 3D SV:   8700.00 mm   +--------------+--------++ +------------+-------------++ LVOT Area:    3.46 cm +--------------+--------++                        +--------------+--------++  +---------------+---------++ TRICUSPID VALVE          +---------------+---------++ TR Peak grad:  45.0 mmHg +---------------+---------++  +--------------+-------+ SHUNTS                +--------------+-------+ Systemic Diam:2.10 cm +--------------+-------+   Marcene Duos MD Electronically signed by Marcene Duos MD Signature Date/Time: 11/09/2019/9:57:04 AM    Final            Risk Assessment/Calculations:    CHA2DS2-VASc Score = 3   This indicates a 3.2% annual risk of stroke. The patient's score is based upon: CHF History: 1 HTN History: 1 Diabetes History: 0 Stroke History: 0 Vascular Disease History: 1 Age Score: 0 Gender Score: 0            Physical Exam:   VS:  BP 108/64   Pulse (!) 54   Ht 5\' 7"  (1.702 m)   Wt 230 lb 3.2 oz (104.4 kg)   SpO2 94%   BMI 36.05 kg/m    Wt Readings from Last 3 Encounters:  11/14/22 230 lb 3.2 oz (104.4 kg)  11/08/22 228 lb 6.4 oz (103.6 kg)  11/07/22 227 lb 9.6 oz (103.2 kg)    GEN: Well nourished, well developed in no acute distress NECK: No JVD; No carotid bruits CARDIAC: RRR, no murmurs, rubs, gallops RESPIRATORY:  Clear to auscultation without rales, wheezing or rhonchi  ABDOMEN: Soft, non-tender, non-distended EXTREMITIES:  No edema; No deformity    ASSESSMENT AND PLAN: .    Chronic systolic heart failure: Euvolemic on exam.  Continue carvedilol, Jardiance, Entresto and spironolactone.  Lasix was recently increased.  Renal function stable on blood work obtained by PCP last week.  CAD s/p CABG: Denies any chest pain.  Not on aspirin given the need for Eliquis.  Continue carvedilol, Plavix and Lipitor.  No major bleeding issues.  PAF: Maintaining sinus rhythm based on physical exam.  Continue Eliquis.  Ischemic cardiomyopathy s/p Biotronik ICD: Remote interrogation every 3 months.  PAD: History of lower extremity and the carotid intervention.  Followed by Dr. Myra [name].  Upcoming carotid and lower extremity Doppler in September prior to follow-up with vascular surgery.  Hypertension: Blood pressure stable on current therapy  Hyperlipidemia: On  Lipitor       Dispo: Follow-up with Dr. Jens [name] in 4 to 5 months.  Signed, Azalee Course, PA

## 2022-11-14 NOTE — Patient Instructions (Signed)
Medication Instructions:  Your physician recommends that you continue on your current medications as directed. Please refer to the Current Medication list given to you today.  *If you need a refill on your cardiac medications before your next appointment, please call your pharmacy*   Lab Work: None ordered  If you have labs (blood work) drawn today and your tests are completely normal, you will receive your results only by: MyChart Message (if you have MyChart) OR A paper copy in the mail If you have any lab test that is abnormal or we need to change your treatment, we will call you to review the results.   Testing/Procedures: None ordered   Follow-Up: At [facility], you and your health needs are our priority.  As part of our continuing mission to provide you with exceptional heart care, we have created designated Provider Care Teams.  These Care Teams include your primary Cardiologist (physician) and Advanced Practice Providers (APPs -  Physician Assistants and Nurse Practitioners) who all work together to provide you with the care you need, when you need it.  We recommend signing up for the patient portal called "MyChart".  Sign up information is provided on this After Visit Summary.  MyChart is used to connect with patients for Virtual Visits (Telemedicine).  Patients are able to view lab/test results, encounter notes, upcoming appointments, etc.  Non-urgent messages can be sent to your provider as well.   To learn more about what you can do with MyChart, go to [email or web address].    Your next appointment:   5 month(s)  Provider:   Olga Millers, MD     Other Instructions

## 2022-11-16 ENCOUNTER — Other Ambulatory Visit: Payer: Self-pay

## 2022-12-10 ENCOUNTER — Other Ambulatory Visit: Payer: Self-pay

## 2022-12-10 MED ORDER — FUROSEMIDE 40 MG PO TABS: 40.0000 mg | ORAL_TABLET | Freq: Two times a day (BID) | ORAL | 3 refills | Status: DC

## 2022-12-25 ENCOUNTER — Other Ambulatory Visit: Payer: Self-pay

## 2022-12-25 MED ORDER — CLOPIDOGREL BISULFATE 75 MG PO TABS: 75.0000 mg | ORAL_TABLET | Freq: Every day | ORAL | 11 refills | Status: AC

## 2023-01-01 ENCOUNTER — Ambulatory Visit (INDEPENDENT_AMBULATORY_CARE_PROVIDER_SITE_OTHER)
Admission: RE | Admit: 2023-01-01 | Discharge: 2023-01-01 | Disposition: A | Discharge status: Home / Self Care | Payer: Medicaid Other | Source: Ambulatory Visit | Attending: Vascular Surgery

## 2023-01-01 ENCOUNTER — Ambulatory Visit (INDEPENDENT_AMBULATORY_CARE_PROVIDER_SITE_OTHER): Payer: Medicaid Other | Admitting: Physician Assistant

## 2023-01-01 ENCOUNTER — Ambulatory Visit (HOSPITAL_COMMUNITY)
Admission: RE | Admit: 2023-01-01 | Discharge: 2023-01-01 | Disposition: A | Discharge status: Home / Self Care | Payer: Medicaid Other | Source: Ambulatory Visit | Attending: Vascular Surgery | Admitting: Vascular Surgery

## 2023-01-01 ENCOUNTER — Ambulatory Visit (INDEPENDENT_AMBULATORY_CARE_PROVIDER_SITE_OTHER)
Admission: RE | Admit: 2023-01-01 | Discharge: 2023-01-01 | Disposition: A | Discharge status: Home / Self Care | Payer: Medicaid Other | Source: Ambulatory Visit | Attending: Vascular Surgery | Admitting: Vascular Surgery

## 2023-01-01 VITALS — BP 149/80 | HR 59 | Temp 97.9°F | Resp 18 | Ht 67.0 in | Wt 234.5 lb

## 2023-01-01 DIAGNOSIS — I70213 Atherosclerosis of native arteries of extremities with intermittent claudication, bilateral legs: Secondary | ICD-10-CM

## 2023-01-01 DIAGNOSIS — I6523 Occlusion and stenosis of bilateral carotid arteries: Secondary | ICD-10-CM

## 2023-01-01 DIAGNOSIS — I739 Peripheral vascular disease, unspecified: Secondary | ICD-10-CM | POA: Diagnosis present

## 2023-01-01 LAB — VAS US ABI WITH/WO TBI
Left ABI: 0.66
Right ABI: 0.75

## 2023-01-01 NOTE — Progress Notes (Signed)
Office Note     CC:  follow up Requesting Provider:  Cathey Endow, MD  HPI: [name] is a 62 y.o. ([birth year]) male who presents for follow up of PAD and Carotid artery stenosis. He has extensive vascular surgery history. This includes bilateral SFA stenting by Dr. Myra Gianotti in 2022 and staged bilateral TCAR's in 2022 as well. He overall has done well since these interventions. He does continue to have bilateral weakness in his legs but he has been without any classic claudication symptoms, rest pain or tissue loss.   Today he says overall he feels his legs are a little better. They still do get weak especially on incline or stairs. He feels though he can go longer than 100 yards before they get very tired. He denies any pain at rest or pain that wakes him up from sleep. He does not  have any tissue loss. He is medically managed on Plavix, Eliquis and statin.   He denies any visual changes, slurred speech, facial drooping, unilateral upper or lower extremity weakness or numbness.   He says he was in the Hospital in July for CHF but otherwise denies any changes in his medical history since his last visit.   Past Medical History:  Diagnosis Date   AICD (automatic cardioverter/defibrillator) present    Anginal pain (HCC)    Anxiety    CAD (coronary artery disease)    CHF (congestive heart failure) (HCC)    Chronic back pain    Colon polyps    Diabetes mellitus    Heart attack (HCC)    Hyperlipidemia    Hypertension    Neuropathy    Panic attacks    PVD (peripheral vascular disease) (HCC)    Sleep apnea     Past Surgical History:  Procedure Laterality Date   ABDOMINAL AORTOGRAM W/LOWER EXTREMITY N/A 01/17/2021   Procedure: ABDOMINAL AORTOGRAM W/LOWER EXTREMITY;  Surgeon: Nada Libman, MD;  [facility]: [facility];  Service: Cardiovascular;  Laterality: N/A;   ABDOMINAL AORTOGRAM W/LOWER EXTREMITY N/A 02/07/2021   Procedure: ABDOMINAL AORTOGRAM W/LOWER EXTREMITY;   Surgeon: Nada Libman, MD;  [facility]: [facility];  Service: Cardiovascular;  Laterality: N/A;   APPLICATION OF A-CELL OF BACK N/A 12/09/2019   Procedure: APPLICATION OF A-CELL OF BACK;  Surgeon: [name], MD;  Location: [facility];  Service: Thoracic;  Laterality: N/A;   APPLICATION OF A-CELL OF CHEST/ABDOMEN N/A 12/04/2019   Procedure: APPLICATION OF A-CELL OF CHEST;  Surgeon: [name], MD;  Location: [facility];  Service: Thoracic;  Laterality: N/A;   APPLICATION OF WOUND VAC N/A 11/27/2019   Procedure: APPLICATION OF WOUND VAC;  Surgeon: Kerin Perna, MD;  Location: [facility] OR;  Service: Cardiovascular;  Laterality: N/A;  Lower midline superficial sternal wound area.   APPLICATION OF WOUND VAC N/A 12/09/2019   Procedure: WOUND VAC EXCHANGE;  Surgeon: [name], MD;  [facility]: [facility];  Service: Thoracic;  Laterality: N/A;   APPLICATION OF WOUND VAC N/A 12/14/2019   Procedure: WOUND VAC CHANGE;  Surgeon: [name], MD;  [facility]: [facility];  Service: Thoracic;  Laterality: N/A;   CORONARY ANGIOPLASTY WITH STENT PLACEMENT     CORONARY ARTERY BYPASS GRAFT N/A 10/29/2019   Procedure: CORONARY ARTERY BYPASS GRAFTING (CABG) using LIMA to LAD; RIMA to PDA; and Left radial harvest vein to Circ.;  Surgeon: [name], MD;  [facility]: [facility];  Service: Open Heart Surgery;  Laterality: N/A;  EAR CYST EXCISION  12/10/2011   Procedure: CYST REMOVAL;  Surgeon: Georgia Lopes, DDS;  Location: [facility];  Service: Oral Surgery;  Laterality: Left;  Left Mandible Cyst Removal   ICD IMPLANT N/A 10/26/2020   Procedure: ICD IMPLANT;  Surgeon: Marinus Maw, MD;  Location: [facility] CV [facility];  Service: Cardiovascular;  Laterality: N/A;   IR THORACENTESIS ASP PLEURAL SPACE W/IMG GUIDE  10/27/2019   MULTIPLE EXTRACTIONS WITH ALVEOLOPLASTY  12/10/2011   Procedure: MULTIPLE EXTRACION WITH ALVEOLOPLASTY;  Surgeon: Georgia Lopes, DDS;  [facility];  Service: Oral Surgery;  Laterality: N/A;  Right  Maxillary Tuberosity Reduction; Right  Maxillary Buccal Exostosis; Bilateral Mandibular Tori    PERIPHERAL VASCULAR BALLOON ANGIOPLASTY Right 02/07/2021   Procedure: PERIPHERAL VASCULAR BALLOON ANGIOPLASTY;  Surgeon: Nada Libman, MD;  [facility];  Service: Cardiovascular;  Laterality: Right;  Superficial femoral artery   PERIPHERAL VASCULAR CATHETERIZATION N/A 12/28/2014   Procedure: Abdominal Aortogram;  Surgeon: Nada Libman, MD;  [facility];  Service: Cardiovascular;  Laterality: N/A;   PERIPHERAL VASCULAR CATHETERIZATION N/A 12/20/2015   Procedure: Abdominal Aortogram;  Surgeon: Nada Libman, MD;  [facility];  Service: Cardiovascular;  Laterality: N/A;   PERIPHERAL VASCULAR CATHETERIZATION  12/20/2015   Procedure: Peripheral Vascular Balloon Angioplasty;  Surgeon: Nada Libman, MD;  [facility];  Service: Cardiovascular;;   PERIPHERAL VASCULAR INTERVENTION Left 01/17/2021   Procedure: PERIPHERAL VASCULAR INTERVENTION;  Surgeon: Nada Libman, MD;  [facility];  Service: Cardiovascular;  Laterality: Left;  Left popiteal and left SFA   RADIAL ARTERY HARVEST Left 10/29/2019   Procedure: RADIAL ARTERY HARVEST;  Surgeon: [name], MD;  [facility];  Service: Open Heart Surgery;  Laterality: Left;   RIGHT/LEFT HEART CATH AND CORONARY ANGIOGRAPHY N/A 10/28/2019   Procedure: RIGHT/LEFT HEART CATH AND CORONARY ANGIOGRAPHY;  Surgeon: Kathleene Hazel, MD;  [facility];  Service: Cardiovascular;  Laterality: N/A;   STERNAL WOUND DEBRIDEMENT N/A 12/01/2019   Procedure: STERNAL WOUND DEBRIDEMENT and WOUND VAC CHANGE;  Surgeon: [name], MD;  [facility];  Service: Thoracic;  Laterality: N/A;   STERNAL WOUND DEBRIDEMENT N/A 12/04/2019   Procedure: STERNAL WOUND DEBRIDEMENT and WOUND VAC CHANGE;  Surgeon: [name], MD;  [facility];  Service: Thoracic;   Laterality: N/A;   STERNAL WOUND DEBRIDEMENT N/A 12/09/2019   Procedure: STERNAL WOUND DEBRIDEMENT;  Surgeon: [name], MD;  [facility];  Service: Thoracic;  Laterality: N/A;   TEE WITHOUT CARDIOVERSION N/A 10/29/2019   Procedure: TRANSESOPHAGEAL ECHOCARDIOGRAM (TEE);  Surgeon: [name], MD;  Location: [facility] OR;  Service: Open Heart Surgery;  Laterality: N/A;   TRANSCAROTID ARTERY REVASCULARIZATION  Right 12/01/2020   Procedure: RIGHT TRANSCAROTID ARTERY REVASCULARIZATION;  Surgeon: Nada Libman, MD;  [facility];  Service: Vascular;  Laterality: Right;   TRANSCAROTID ARTERY REVASCULARIZATION  Left 12/30/2020   Procedure: LEFT TRANSCAROTID ARTERY REVASCULARIZATION;  Surgeon: Nada Libman, MD;  [facility];  Service: Vascular;  Laterality: Left;   ULTRASOUND GUIDANCE FOR VASCULAR ACCESS Left 12/01/2020   Procedure: ULTRASOUND GUIDANCE FOR VASCULAR ACCESS;  Surgeon: Nada Libman, MD;  [facility];  Service: Vascular;  Laterality: Left;   ULTRASOUND GUIDANCE FOR VASCULAR ACCESS Right 12/30/2020   Procedure: ULTRASOUND GUIDANCE FOR VASCULAR ACCESS, RIGHT FEMORAL VEIN;  Surgeon: Nada Libman, MD;  [facility];  Service: Vascular;  Laterality: Right;   WOUND EXPLORATION N/A 11/27/2019   Procedure: EXCISIONAL DEBRIDEMENT OF SUPERFICIAL STERNAL WOUND EXPLORATION;  Surgeon: Kerin Perna, MD;  Location: [facility] OR;  Service: Cardiovascular;  Laterality: N/A;  Superficial sternal wound    Social History   Socioeconomic History   Marital status: Legally Separated    Spouse name: Not on file   Number of children: 3   Years of education: Not on file   Highest education level: 11th grade  Occupational History    Comment: Unemployed   Occupation: disability  Tobacco Use   Smoking status: Former    Current packs/day: 0.00    Average packs/day: 0.2 packs/day for 43.5 years (10.9 ttl pk-yrs)    Types: Cigarettes    Start date: 75    Quit date: 10/23/2019     Years since quitting: 3.1    Passive exposure: Never   Smokeless tobacco: Never   Tobacco comments:    1/4 pack per day. Stopped 3 years ago  Vaping Use   Vaping status: Never Used  Substance and Sexual Activity   Alcohol use: No    Alcohol/week: 0.0 standard drinks of alcohol   Drug use: No   Sexual activity: Not on file  Other Topics Concern   Not on file  Social History Narrative   Not on file   Social Determinants of Health   Financial Resource Strain: Low Risk  (10/30/2022)   Overall Financial Resource Strain (CARDIA)    Difficulty of Paying Living Expenses: Not very hard  Food Insecurity: Low Risk  (11/01/2022)   Received from [facility]   Hunger Vital Sign    Worried About Running Out of Food in the Last Year: Never true    Ran Out of Food in the Last Year: Never true  Transportation Needs: Not on file (11/01/2022)  Physical Activity: Insufficiently Active (10/29/2019)   Exercise Vital Sign    Days of Exercise per Week: 1 day    Minutes of Exercise per Session: 30 min  Stress: Not on file  Social Connections: Socially Isolated (10/29/2019)   Social Connection and Isolation Panel [NHANES]    Frequency of Communication with Friends and Family: More than three times a week    Frequency of Social Gatherings with Friends and Family: More than three times a week    Attends Religious Services: Never    Database administrator or Organizations: No    Attends Banker Meetings: Never    Marital Status: Divorced  Catering manager Violence: Not At Risk (10/28/2022)   Humiliation, Afraid, Rape, and Kick questionnaire    Fear of Current or Ex-Partner: No    Emotionally Abused: No    Physically Abused: No    Sexually Abused: No    Family History  Problem Relation Age of Onset   Diabetes Father    Kidney failure Father    Hypertension Father    Hyperlipidemia Father    Heart disease Father     Current Outpatient Medications  Medication Sig Dispense Refill    ACCU-CHEK GUIDE test strip      atorvastatin (LIPITOR) 80 MG tablet Take 1 tablet (80 mg total) by mouth daily. 90 tablet 3   carvedilol (COREG) 25 MG tablet Take 1 tablet (25 mg total) by mouth 2 (two) times daily with a meal. 180 tablet 0   clopidogrel (PLAVIX) 75 MG tablet Take 1 tablet (75 mg total) by mouth daily. 30  tablet 11   ELIQUIS 5 MG TABS tablet TAKE 1 TABLET BY MOUTH TWICE DAILY 60 tablet 5   empagliflozin (JARDIANCE) 25 MG TABS tablet Take 25 mg by mouth daily.     omeprazole (PRILOSEC OTC) 20 MG tablet Take 20 mg by mouth daily as needed (for reflux).     OXYGEN Inhale 3 L/min into the lungs at bedtime as needed (for shortness of breath).     spironolactone (ALDACTONE) 25 MG tablet Take 1 tablet (25 mg total) by mouth daily. 30 tablet 0   TRESIBA FLEXTOUCH 100 UNIT/ML FlexTouch Pen Inject 50 Units into the skin in the morning.     TRULICITY 3 MG/0.5ML SOPN Inject 3 mg into the skin every Wednesday.     furosemide (LASIX) 40 MG tablet Take 1 tablet (40 mg total) by mouth 2 (two) times daily for 14 days then take 1 tablet once daily. 90 tablet 3   metFORMIN (GLUCOPHAGE) 1000 MG tablet Take 1 tablet (1,000 mg total) by mouth 2 (two) times daily. 60 tablet 0   No current facility-administered medications for this visit.    Allergies  Allergen Reactions   Codeine Itching and Shortness Of Breath   Gabapentin Other (See Comments)    Suicidal thoughts, extreme dreams   Pregabalin Other (See Comments)    Suicidal thoughts, extreme dreams    Simvastatin Other (See Comments)    Pain in joints   Lisinopril Itching and Rash   Morphine Rash   Venlafaxine Rash     REVIEW OF SYSTEMS:   [X]  denotes positive finding, [ ]  denotes negative finding Cardiac  Comments:  Chest pain or chest pressure:    Shortness of breath upon exertion:    Short of breath when lying flat:    Irregular heart rhythm:        Vascular    Pain in calf, thigh, or hip brought on by ambulation:    Pain in  feet at night that wakes you up from your sleep:     Blood clot in your veins:    Leg swelling:  X       Pulmonary    Oxygen at home:    Productive cough:     Wheezing:         Neurologic    Sudden weakness in arms or legs:     Sudden numbness in arms or legs:     Sudden onset of difficulty speaking or slurred speech:    Temporary loss of vision in one eye:     Problems with dizziness:         Gastrointestinal    Blood in stool:     Vomited blood:         Genitourinary    Burning when urinating:     Blood in urine:        Psychiatric    Major depression:         Hematologic    Bleeding problems:    Problems with blood clotting too easily:        Skin    Rashes or ulcers:        Constitutional    Fever or chills:      PHYSICAL EXAMINATION:  Vitals:   01/01/23 0941 01/01/23 0943  BP: 132/70 (!) 149/80  Pulse: (!) 59   Resp: 18   Temp: 97.9 F (36.6 C)   TempSrc: Temporal   Weight: 234 lb 8 oz (106.4 kg)   Height: 5'  7" (1.702 m)     General:  WDWN in NAD; vital signs documented above Gait: Normal HENT: WNL, normocephalic Pulmonary: normal non-labored breathing , without wheezing Cardiac: regular HR Abdomen: distended, soft Vascular Exam/Pulses: 2+ femoral, no palpable distal pulses, feet warm and well perfused Extremities: without ischemic changes, without Gangrene , without cellulitis; without open wounds;  Musculoskeletal: no muscle wasting or atrophy  Neurologic: A&O X 3 Psychiatric:  The pt has Normal affect.   Non-Invasive Vascular Imaging:   +-------+-----------+-----------+------------+------------+  ABI/TBIToday's ABIToday's TBIPrevious ABIPrevious TBI  +-------+-----------+-----------+------------+------------+  Right 0.75       0.50       0.92        0.62          +-------+-----------+-----------+------------+------------+  Left  0.66       0.23       0.96        0.67           +-------+-----------+-----------+------------+------------+  Right Toe pressure 73 mmHg Left toe Pressure 33 mmHg  VAS [place] Lower Extremity Arterial Bilateral: +-----------+--------+-----+---------------+----------+--------+  RIGHT     PSV cm/sRatioStenosis       Waveform  Comments  +-----------+--------+-----+---------------+----------+--------+  CFA Distal 370          50-74% stenosisbiphasic            +-----------+--------+-----+---------------+----------+--------+  DFA       64                          monophasic          +-----------+--------+-----+---------------+----------+--------+  SFA Prox   167          30-49% stenosisbiphasic            +-----------+--------+-----+---------------+----------+--------+  TP Trunk   52                          biphasic            +-----------+--------+-----+---------------+----------+--------+  ATA Distal 36                          biphasic            +-----------+--------+-----+---------------+----------+--------+  PTA Distal 67                          biphasic            +-----------+--------+-----+---------------+----------+--------+  PERO Mid   41                          biphasic            +-----------+--------+-----+---------------+----------+--------+  PERO Distal26                          biphasic            +-----------+--------+-----+---------------+----------+--------+     Right Stent(s):  +---------------+--------+--------+--------+--------+  SFA-pop       PSV cm/sStenosisWaveformComments  +---------------+--------+--------+--------+--------+  Prox to Stent  139             biphasic          +---------------+--------+--------+--------+--------+  Proximal Stent 123             biphasic          +---------------+--------+--------+--------+--------+  Mid Stent  118             biphasic           +---------------+--------+--------+--------+--------+  Distal Stent   92              biphasic          +---------------+--------+--------+--------+--------+  Distal to Stent68              biphasic          +---------------+--------+--------+--------+--------+     +-----------+--------+-----+--------+------------------------------+-------  -+  LEFT      PSV cm/sRatioStenosisWaveform                       Comments  +-----------+--------+-----+--------+------------------------------+-------  -+  CFA Distal 113                  biphasic                                 +-----------+--------+-----+--------+------------------------------+-------  -+  DFA       95                   biphasic                                 +-----------+--------+-----+--------+------------------------------+-------  -+  SFA Prox   101                  biphasic                                 +-----------+--------+-----+--------+------------------------------+-------  -+  SFA Mid    61                                                            +-----------+--------+-----+--------+------------------------------+-------  -+  ATA Distal 17                   dampened retrograde monophasic           +-----------+--------+-----+--------+------------------------------+-------  -+  PTA Distal 53                   monophasic                               +-----------+--------+-----+--------+------------------------------+-------  -+  PERO Prox  12                   dampened monophasic                      +-----------+--------+-----+--------+------------------------------+-------  -+  PERO Mid                occluded                                         +-----------+--------+-----+--------+------------------------------+-------  -+  PERO Distal             occluded                                          +-----------+--------+-----+--------+------------------------------+-------  -+  Left Stent(s):  +---------------+--------+--------+----------+--------+  SFA- TPT       PSV cm/sStenosisWaveform  Comments  +---------------+--------+--------+----------+--------+  Prox to Stent  44              monophasic          +---------------+--------+--------+----------+--------+  Proximal Stent 45              biphasic            +---------------+--------+--------+----------+--------+  Mid Stent      55              biphasic            +---------------+--------+--------+----------+--------+  Distal Stent   57              biphasic            +---------------+--------+--------+----------+--------+  Distal to Stent56              monophasic          +---------------+--------+--------+----------+--------+    Summary:  Right: 50-74% stenosis noted in the common femoral artery. 30-49% stenosis noted in the deep femoral artery. Patent stent with no evidence of stenosis in the superficial femoral artery and popliteal artery.   Left: Total occlusion noted in the peroneal artery. Patent stent with no evidence of stenosis in the superficial femoral and popliteal artery. Retrograde anterior tibial artery with dampened monophasic flow.    VAS US Carotid Duplex: Summary:  Right Carotid: The ECA appears >50% stenosed. Patent ICA stent without evidence of restenosis.   Left Carotid: The ECA appears >50% stenosed. Patent ICA stent without evidence of restenosis.   Vertebrals:  Left vertebral artery demonstrates antegrade flow. Right vertebral artery demonstrates retrograde flow  Subclavians: Normal flow hemodynamics were seen in bilateral subclavian arteries.   ASSESSMENT/PLAN:: 62 y.o. male here for follow up for PAD and carotid artery stenosis. His symptoms are overall stable. He has weakness in his legs at about 100 yards. He does not have classic claudication  symptoms. He does not have any rest pain or tissue loss. He is without any TIA or stroke like symptoms. His Carotid duplex today shows patent bilateral ICA stents.  - Today's ABI decreased bilaterally. Toe pressures significantly decreased on the left from prior study -Arterial duplex demonstrates right CF elevated velocity, this has been present but velocities are trending up (previously 246 cm/s, now 370 cm/s). Bilateral SFA stents otherwise are patent. His left duplex does show worsening disease in his tibial arteries. This could possibly explain the drop in his left ABI/ TBI in particular - I have encouraged him to walk as much as possible to help promote collaterals - At this time his symptoms are unchanged and without any rest pain or tissue loss - continue Plavix, Eliquis and Statin - I do not think any intervention is indicated at this time. I will have him follow up with repeat duplex BLE and ABI in 6 months - Carotid duplex will be repeated in 1 year - He knows to call for earlier follow up should he have new or worseniing symptoms   Graceann Congress, PA-C [facility] [phone]  Clinic MD: Steve Rattler

## 2023-01-03 ENCOUNTER — Other Ambulatory Visit: Payer: Self-pay | Admitting: Cardiology

## 2023-01-03 DIAGNOSIS — I480 Paroxysmal atrial fibrillation: Secondary | ICD-10-CM

## 2023-01-04 ENCOUNTER — Other Ambulatory Visit: Payer: Self-pay | Admitting: Cardiology

## 2023-01-09 ENCOUNTER — Other Ambulatory Visit: Payer: Self-pay

## 2023-01-09 DIAGNOSIS — I6523 Occlusion and stenosis of bilateral carotid arteries: Secondary | ICD-10-CM

## 2023-01-09 DIAGNOSIS — I739 Peripheral vascular disease, unspecified: Secondary | ICD-10-CM

## 2023-01-11 ENCOUNTER — Other Ambulatory Visit: Payer: Self-pay | Admitting: Cardiology

## 2023-01-16 ENCOUNTER — Ambulatory Visit: Payer: Medicaid Other | Admitting: Physician Assistant

## 2023-01-24 ENCOUNTER — Telehealth: Payer: Self-pay | Admitting: Cardiology

## 2023-01-24 NOTE — Telephone Encounter (Signed)
*  STAT* If patient is at the pharmacy, call can be transferred to refill team.   1. Which medications need to be refilled? (please list name of each medication and dose if known)   ELIQUIS 5 MG TABS tablet  TAKE 1 TABLET BY MOUTH TWICE DAILY 2. Would you like to learn more about the convenience, safety, & potential cost savings by using the [facility]? No    3. Are you open to using the [facility] No   4. Which pharmacy/location (including street and city if local pharmacy) is medication to be sent to? [facility] [place] [place] - [address]    5. Do they need a 30 day or 90 day supply? 90 Day Supply   Pt is currently out of the medication

## 2023-01-25 ENCOUNTER — Other Ambulatory Visit: Payer: Self-pay

## 2023-01-25 ENCOUNTER — Ambulatory Visit: Payer: Medicaid Other

## 2023-01-25 DIAGNOSIS — I480 Paroxysmal atrial fibrillation: Secondary | ICD-10-CM | POA: Diagnosis not present

## 2023-01-25 LAB — CUP PACEART REMOTE DEVICE CHECK
Date Time Interrogation Session: 20241004120631
Implantable Lead Connection Status: 753985
Implantable Lead Implant Date: 20220706
Implantable Lead Location: 753860
Implantable Lead Model: 436909
Implantable Lead Serial Number: 81441117
Implantable Pulse Generator Implant Date: 20220706
Pulse Gen Model: 429525
Pulse Gen Serial Number: 84841030

## 2023-01-25 MED ORDER — APIXABAN 5 MG PO TABS: 5.0000 mg | ORAL_TABLET | Freq: Two times a day (BID) | ORAL | 1 refills | Status: DC

## 2023-01-25 NOTE — Telephone Encounter (Signed)
Prescription refill request for Eliquis received. Indication:afib Last office visit:7/24 Scr:1.27  7/24 Age: 62 Weight:106.4  kg  Prescription refilled

## 2023-01-30 ENCOUNTER — Other Ambulatory Visit: Payer: Self-pay | Admitting: Cardiology

## 2023-02-01 ENCOUNTER — Other Ambulatory Visit (HOSPITAL_COMMUNITY): Payer: Self-pay

## 2023-02-01 NOTE — Progress Notes (Signed)
Remote ICD transmission.   

## 2023-02-03 ENCOUNTER — Other Ambulatory Visit: Payer: Self-pay | Admitting: Cardiology

## 2023-02-03 DIAGNOSIS — E7800 Pure hypercholesterolemia, unspecified: Secondary | ICD-10-CM

## 2023-02-12 ENCOUNTER — Telehealth: Payer: Self-pay | Admitting: *Deleted

## 2023-02-12 NOTE — Telephone Encounter (Signed)
Spoke with pt, received a fax from [place] at [facility], the bnp level is elevated at 690.1. patient reports feeling fine. He denies swelling, SOB or weight gain. Follow up scheduled from recall. Patient voiced understanding to call prior to appointment if his current statis changes.

## 2023-04-03 ENCOUNTER — Other Ambulatory Visit: Payer: Self-pay | Admitting: Cardiology

## 2023-04-04 ENCOUNTER — Other Ambulatory Visit: Payer: Self-pay

## 2023-04-04 ENCOUNTER — Telehealth: Payer: Self-pay | Admitting: Cardiology

## 2023-04-04 MED ORDER — FUROSEMIDE 40 MG PO TABS: 40.0000 mg | ORAL_TABLET | Freq: Two times a day (BID) | ORAL | 0 refills | Status: DC

## 2023-04-04 NOTE — Telephone Encounter (Signed)
Called patient to advise medication refill sent to pharmacy °

## 2023-04-04 NOTE — Telephone Encounter (Signed)
*  STAT* If patient is at the pharmacy, call can be transferred to refill team.   1. Which medications need to be refilled? (please list name of each medication and dose if known) furosemide (LASIX) 40 MG tablet (Expired)   2. Which pharmacy/location (including street and city if local pharmacy) is medication to be sent to? [facility] - [place], [place] - [address]   3. Do they need a 30 day or 90 day supply? 90   Patient is out of medication

## 2023-04-05 ENCOUNTER — Telehealth: Payer: Self-pay | Admitting: Cardiology

## 2023-04-05 DIAGNOSIS — I480 Paroxysmal atrial fibrillation: Secondary | ICD-10-CM

## 2023-04-05 MED ORDER — APIXABAN 5 MG PO TABS: 5.0000 mg | ORAL_TABLET | Freq: Two times a day (BID) | ORAL | 1 refills | Status: AC

## 2023-04-05 NOTE — Telephone Encounter (Signed)
Spoke to patient regarding medication, a new script was sent in

## 2023-04-05 NOTE — Telephone Encounter (Signed)
Pt c/o medication issue:  1. Name of Medication: furosemide (LASIX) 40 MG tablet   2. How are you currently taking this medication (dosage and times per day)?    3. Are you having a reaction (difficulty breathing--STAT)? no  4. What is your medication issue? Calling to see if our office have any samples, that could get him till 12/16. Please advise

## 2023-04-15 NOTE — Progress Notes (Signed)
 HPI: Follow-up PAF, coronary artery disease and congestive heart failure.  Patient admitted with congestive heart failure July 2021.  Cardiac catheterization showed severe three-vessel coronary artery disease. He had coronary artery bypass graft with a LIMA to the LAD and RIMA to the PDA July 2021.  Postoperative course complicated by atrial fibrillation. Patient had ICD implanted June 2022. Echocardiogram July 2024 showed ejection fraction 25 to 30%, moderate left ventricular hypertrophy, grade 2 diastolic dysfunction, mild RV dysfunction, mild biatrial enlargement.  Carotid Doppler September 2024 showed patent stent in the right internal carotid artery and left carotid artery.  Also with peripheral vascular disease followed by vascular surgery.  Since last seen he does have dyspnea on exertion unchanged.  No orthopnea, PND, pedal edema, chest pain or syncope.  Current Outpatient Medications  Medication Sig Dispense Refill   ACCU-CHEK GUIDE test strip      apixaban  (ELIQUIS ) 5 MG TABS tablet Take 1 tablet (5 mg total) by mouth 2 (two) times daily. 180 tablet 1   apixaban  (ELIQUIS ) 5 MG TABS tablet Take 1 tablet (5 mg total) by mouth 2 (two) times daily. 60 tablet 1   atorvastatin  (LIPITOR ) 80 MG tablet TAKE ONE TABLET BY MOUTH DAILY 90 tablet 3   carvedilol  (COREG ) 25 MG tablet take ONE tablet by MOUTH TWO times daily with a meal 180 tablet 1   clopidogrel  (PLAVIX ) 75 MG tablet Take 1 tablet (75 mg total) by mouth daily. 30 tablet 11   empagliflozin  (JARDIANCE ) 25 MG TABS tablet Take 25 mg by mouth daily.     omeprazole (PRILOSEC OTC) 20 MG tablet Take 20 mg by mouth daily as needed (for reflux).     OXYGEN Inhale 3 L/min into the lungs at bedtime as needed (for shortness of breath).     spironolactone  (ALDACTONE ) 25 MG tablet TAKE ONE-HALF TABLET BY MOUTH DAILY 45 tablet 3   TRESIBA  FLEXTOUCH 100 UNIT/ML FlexTouch Pen Inject 50 Units into the skin in the morning.     TRULICITY  3 MG/0.5ML  SOPN Inject 3 mg into the skin every Wednesday.     furosemide  (LASIX ) 40 MG tablet Take 1 tablet (40 mg total) by mouth 2 (two) times daily for 14 days. Schedule Appointment for Future Refills [phone] 90 tablet 0   metFORMIN  (GLUCOPHAGE ) 1000 MG tablet Take 1 tablet (1,000 mg total) by mouth 2 (two) times daily. 60 tablet 0   No current facility-administered medications for this visit.     Past Medical History:  Diagnosis Date   AICD (automatic cardioverter/defibrillator) present    Anginal pain (HCC)    Anxiety    CAD (coronary artery disease)    CHF (congestive heart failure) (HCC)    Chronic back pain    Colon polyps    Diabetes mellitus    Heart attack (HCC)    Hyperlipidemia    Hypertension    Neuropathy    Panic attacks    PVD (peripheral vascular disease) (HCC)    Sleep apnea     Past Surgical History:  Procedure Laterality Date   ABDOMINAL AORTOGRAM W/LOWER EXTREMITY N/A 01/17/2021   Procedure: ABDOMINAL AORTOGRAM W/LOWER EXTREMITY;  Surgeon: Serene Gaile ORN, MD;  Location: [facility];  Service: Cardiovascular;  Laterality: N/A;   ABDOMINAL AORTOGRAM W/LOWER EXTREMITY N/A 02/07/2021   Procedure: ABDOMINAL AORTOGRAM W/LOWER EXTREMITY;  Surgeon: Serene Gaile ORN, MD;  Location: [facility];  Service: Cardiovascular;  Laterality: N/A;   APPLICATION OF A-CELL OF  BACK N/A 12/09/2019   Procedure: APPLICATION OF A-CELL OF BACK;  Surgeon: German Bartlett PEDLAR, MD;  Location: [facility];  Service: Thoracic;  Laterality: N/A;   APPLICATION OF A-CELL OF CHEST/ABDOMEN N/A 12/04/2019   Procedure: APPLICATION OF A-CELL OF CHEST;  Surgeon: German Bartlett PEDLAR, MD;  Location: [facility];  Service: Thoracic;  Laterality: N/A;   APPLICATION OF WOUND VAC N/A 11/27/2019   Procedure: APPLICATION OF WOUND VAC;  Surgeon: Fleeta Hanford Coy, MD;  Location: [facility] OR;  Service: Cardiovascular;  Laterality: N/A;  Lower midline superficial sternal wound area.   APPLICATION OF WOUND VAC N/A 12/09/2019    Procedure: WOUND VAC EXCHANGE;  Surgeon: German Bartlett PEDLAR, MD;  [facility]: [facility];  Service: Thoracic;  Laterality: N/A;   APPLICATION OF WOUND VAC N/A 12/14/2019   Procedure: WOUND VAC CHANGE;  Surgeon: German Bartlett PEDLAR, MD;  Location: [facility];  Service: Thoracic;  Laterality: N/A;   CORONARY ANGIOPLASTY WITH STENT PLACEMENT     CORONARY ARTERY BYPASS GRAFT N/A 10/29/2019   Procedure: CORONARY ARTERY BYPASS GRAFTING (CABG) using LIMA to LAD; RIMA to PDA; and Left radial harvest vein to Circ.;  Surgeon: German Bartlett PEDLAR, MD;  Location: [facility];  Service: Open Heart Surgery;  Laterality: N/A;   EAR CYST EXCISION  12/10/2011   Procedure: CYST REMOVAL;  Surgeon: Glendia CHRISTELLA Primrose, DDS;  Location: [facility];  Service: Oral Surgery;  Laterality: Left;  Left Mandible Cyst Removal   ICD IMPLANT N/A 10/26/2020   Procedure: ICD IMPLANT;  Surgeon: Waddell Danelle ORN, MD;  [facility] IN[facility] CV LAB;  Service: Cardiovascular;  Laterality: N/A;   IR THORACENTESIS ASP PLEURAL SPACE W/IMG GUIDE  10/27/2019   MULTIPLE EXTRACTIONS WITH ALVEOLOPLASTY  12/10/2011   Procedure: MULTIPLE EXTRACION WITH ALVEOLOPLASTY;  Surgeon: Glendia CHRISTELLA Primrose, DDS;  Location: [facility];  Service: Oral Surgery;  Laterality: N/A;  Right Maxillary Tuberosity Reduction; Right  Maxillary Buccal Exostosis; Bilateral Mandibular Tori    PERIPHERAL VASCULAR BALLOON ANGIOPLASTY Right 02/07/2021   Procedure: PERIPHERAL VASCULAR BALLOON ANGIOPLASTY;  Surgeon: Serene Gaile ORN, MD;  [facility]: [facility];  Service: Cardiovascular;  Laterality: Right;  Superficial femoral artery   PERIPHERAL VASCULAR CATHETERIZATION N/A 12/28/2014   Procedure: Abdominal Aortogram;  Surgeon: Gaile ORN Serene, MD;  [facility]: [facility];  Service: Cardiovascular;  Laterality: N/A;   PERIPHERAL VASCULAR CATHETERIZATION N/A 12/20/2015   Procedure: Abdominal Aortogram;  Surgeon: Gaile ORN Serene, MD;  [facility]: [facility];  Service: Cardiovascular;  Laterality: N/A;    PERIPHERAL VASCULAR CATHETERIZATION  12/20/2015   Procedure: Peripheral Vascular Balloon Angioplasty;  Surgeon: Gaile ORN Serene, MD;  [facility];  Service: Cardiovascular;;   PERIPHERAL VASCULAR INTERVENTION Left 01/17/2021   Procedure: PERIPHERAL VASCULAR INTERVENTION;  Surgeon: Serene Gaile ORN, MD;  [facility];  Service: Cardiovascular;  Laterality: Left;  Left popiteal and left SFA   RADIAL ARTERY HARVEST Left 10/29/2019   Procedure: RADIAL ARTERY HARVEST;  Surgeon: German Bartlett PEDLAR, MD;  [facility];  Service: Open Heart Surgery;  Laterality: Left;   RIGHT/LEFT HEART CATH AND CORONARY ANGIOGRAPHY N/A 10/28/2019   Procedure: RIGHT/LEFT HEART CATH AND CORONARY ANGIOGRAPHY;  Surgeon: Verlin Lonni BIRCH, MD;  [facility]: [facility];  Service: Cardiovascular;  Laterality: N/A;   STERNAL WOUND DEBRIDEMENT N/A 12/01/2019   Procedure: STERNAL WOUND DEBRIDEMENT and WOUND VAC CHANGE;  Surgeon: German Bartlett PEDLAR, MD;  [facility]: [facility];  Service: Thoracic;  Laterality:  N/A;   STERNAL WOUND DEBRIDEMENT N/A 12/04/2019   Procedure: STERNAL WOUND DEBRIDEMENT and WOUND VAC CHANGE;  Surgeon: German Bartlett PEDLAR, MD;  [facility];  Service: Thoracic;  Laterality: N/A;   STERNAL WOUND DEBRIDEMENT N/A 12/09/2019   Procedure: STERNAL WOUND DEBRIDEMENT;  Surgeon: German Bartlett PEDLAR, MD;  [facility];  Service: Thoracic;  Laterality: N/A;   TEE WITHOUT CARDIOVERSION N/A 10/29/2019   Procedure: TRANSESOPHAGEAL ECHOCARDIOGRAM (TEE);  Surgeon: German Bartlett PEDLAR, MD;  Location: [facility] OR;  Service: Open Heart Surgery;  Laterality: N/A;   TRANSCAROTID ARTERY REVASCULARIZATION  Right 12/01/2020   Procedure: RIGHT TRANSCAROTID ARTERY REVASCULARIZATION;  Surgeon: Serene Gaile ORN, MD;  [facility];  Service: Vascular;  Laterality: Right;   TRANSCAROTID ARTERY REVASCULARIZATION  Left 12/30/2020   Procedure: LEFT TRANSCAROTID ARTERY REVASCULARIZATION;  Surgeon: Serene Gaile ORN, MD;   [facility];  Service: Vascular;  Laterality: Left;   ULTRASOUND GUIDANCE FOR VASCULAR ACCESS Left 12/01/2020   Procedure: ULTRASOUND GUIDANCE FOR VASCULAR ACCESS;  Surgeon: Serene Gaile ORN, MD;  [facility];  Service: Vascular;  Laterality: Left;   ULTRASOUND GUIDANCE FOR VASCULAR ACCESS Right 12/30/2020   Procedure: ULTRASOUND GUIDANCE FOR VASCULAR ACCESS, RIGHT FEMORAL VEIN;  Surgeon: Serene Gaile ORN, MD;  Location: [facility];  Service: Vascular;  Laterality: Right;   WOUND EXPLORATION N/A 11/27/2019   Procedure: EXCISIONAL DEBRIDEMENT OF SUPERFICIAL STERNAL WOUND EXPLORATION;  Surgeon: Fleeta Hanford Coy, MD;  Location: [facility] OR;  Service: Cardiovascular;  Laterality: N/A;  Superficial sternal wound    Social History   Socioeconomic History   Marital status: Legally Separated    Spouse name: Not on file   Number of children: 3   Years of education: Not on file   Highest education level: 11th grade  Occupational History    Comment: Unemployed   Occupation: disability  Tobacco Use   Smoking status: Former    Current packs/day: 0.00    Average packs/day: 0.2 packs/day for 43.5 years (10.9 ttl pk-yrs)    Types: Cigarettes    Start date: 36    Quit date: 10/23/2019    Years since quitting: 3.5    Passive exposure: Never   Smokeless tobacco: Never   Tobacco comments:    1/4 pack per day. Stopped 3 years ago  Vaping Use   Vaping status: Never Used  Substance and Sexual Activity   Alcohol use: No    Alcohol/week: 0.0 standard drinks of alcohol   Drug use: No   Sexual activity: Not on file  Other Topics Concern   Not on file  Social History Narrative   Not on file   Social Drivers of Health   Financial Resource Strain: Low Risk  (10/30/2022)   Overall Financial Resource Strain (CARDIA)    Difficulty of Paying Living Expenses: Not very hard  Food Insecurity: Low Risk  (11/01/2022)   Received from [facility]   Hunger Vital Sign    Worried About Running Out of Food in the  Last Year: Never true    Ran Out of Food in the Last Year: Never true  Transportation Needs: Not on file (11/01/2022)  Physical Activity: Insufficiently Active (10/29/2019)   Exercise Vital Sign    Days of Exercise per Week: 1 day    Minutes of Exercise per Session: 30 min  Stress: Not on file  Social Connections: Socially Isolated (10/29/2019)   Social Connection and Isolation Panel [NHANES]    Frequency of Communication with Friends and Family:  More than three times a week    Frequency of Social Gatherings with Friends and Family: More than three times a week    Attends Religious Services: Never    Database Administrator or Organizations: No    Attends Banker Meetings: Never    Marital Status: Divorced  Catering Manager Violence: Not At Risk (10/28/2022)   Humiliation, Afraid, Rape, and Kick questionnaire    Fear of Current or Ex-Partner: No    Emotionally Abused: No    Physically Abused: No    Sexually Abused: No    Family History  Problem Relation Age of Onset   Diabetes Father    Kidney failure Father    Hypertension Father    Hyperlipidemia Father    Heart disease Father     ROS: no fevers or chills, productive cough, hemoptysis, dysphasia, odynophagia, melena, hematochezia, dysuria, hematuria, rash, seizure activity, orthopnea, PND, pedal edema, claudication. Remaining systems are negative.  Physical Exam: Well-developed well-nourished in no acute distress.  Skin is warm and dry.  HEENT is normal.  Neck is supple.  Chest is clear to auscultation with normal expansion.  Cardiovascular exam is regular rate and rhythm.  Abdominal exam nontender or distended. No masses palpated. Extremities show no edema. neuro grossly intact   A/P  1 coronary artery disease status post coronary bypass and graft-continue statin.  2 ischemic cardiomyopathy-continue carvedilol .  Will resume Entresto  24/26 twice daily.  Check potassium and renal function in 1 week.  3  chronic systolic congestive heart failure-patient is euvolemic on examination.  Continue Lasix , spironolactone  and Jardiance .    4 paroxysmal atrial fibrillation-Continue beta-blocker and apixaban .  Check hemoglobin and renal function.  5 ICD-followed by electrophysiology.  6 Peripheral vascular disease/carotid artery disease-Per vascular surgery.  7 hypertension-blood pressure elevated.  Resume Entresto  and follow.  8 hyperlipidemia-continue Lipitor ; check lipids and liver.  Redell Shallow, MD

## 2023-04-26 ENCOUNTER — Ambulatory Visit (INDEPENDENT_AMBULATORY_CARE_PROVIDER_SITE_OTHER): Payer: Medicaid Other

## 2023-04-26 DIAGNOSIS — I255 Ischemic cardiomyopathy: Secondary | ICD-10-CM

## 2023-04-26 LAB — CUP PACEART REMOTE DEVICE CHECK
Date Time Interrogation Session: 20250103074324
Implantable Lead Connection Status: 753985
Implantable Lead Implant Date: 20220706
Implantable Lead Location: 753860
Implantable Lead Model: 436909
Implantable Lead Serial Number: 81441117
Implantable Pulse Generator Implant Date: 20220706
Pulse Gen Model: 429525
Pulse Gen Serial Number: 84841030

## 2023-04-29 ENCOUNTER — Encounter: Payer: Self-pay | Admitting: Cardiology

## 2023-04-29 ENCOUNTER — Ambulatory Visit: Payer: Medicaid Other | Attending: Cardiology | Admitting: Cardiology

## 2023-04-29 VITALS — BP 140/60 | HR 69 | Ht 67.0 in | Wt 235.0 lb

## 2023-04-29 DIAGNOSIS — I480 Paroxysmal atrial fibrillation: Secondary | ICD-10-CM

## 2023-04-29 DIAGNOSIS — E785 Hyperlipidemia, unspecified: Secondary | ICD-10-CM

## 2023-04-29 DIAGNOSIS — I5022 Chronic systolic (congestive) heart failure: Secondary | ICD-10-CM | POA: Diagnosis not present

## 2023-04-29 MED ORDER — SACUBITRIL-VALSARTAN 24-26 MG PO TABS
1.0000 | ORAL_TABLET | Freq: Two times a day (BID) | ORAL | 11 refills | Status: DC
Start: 2023-04-29 — End: 2023-04-29

## 2023-04-29 MED ORDER — SACUBITRIL-VALSARTAN 24-26 MG PO TABS: 1.0000 | ORAL_TABLET | Freq: Two times a day (BID) | ORAL | Status: DC

## 2023-04-29 NOTE — Patient Instructions (Signed)
 Medication Instructions:   START ENTRESTO  24/26 MG ONE TABLET TWICE DAILY  *If you need a refill on your cardiac medications before your next appointment, please call your pharmacy*   Lab Work:  Your physician recommends that you return for lab work in: ONE [facility]  If you have labs (blood work) drawn today and your tests are completely normal, you will receive your results only by: MyChart Message (if you have MyChart) OR A paper copy in the mail If you have any lab test that is abnormal or we need to change your treatment, we will call you to review the results.   Follow-Up: At [facility], you and your health needs are our priority.  As part of our continuing mission to provide you with exceptional heart care, we have created designated Provider Care Teams.  These Care Teams include your primary Cardiologist (physician) and Advanced Practice Providers (APPs -  Physician Assistants and Nurse Practitioners) who all work together to provide you with the care you need, when you need it.  We recommend signing up for the patient portal called MyChart.  Sign up information is provided on this After Visit Summary.  MyChart is used to connect with patients for Virtual Visits (Telemedicine).  Patients are able to view lab/test results, encounter notes, upcoming appointments, etc.  Non-urgent messages can be sent to your provider as well.   To learn more about what you can do with MyChart, go to [email or web address].    Your next appointment:   6 month(s)  Provider:   Redell Shallow, MD

## 2023-05-07 LAB — LIPID PANEL

## 2023-05-08 ENCOUNTER — Encounter: Payer: Self-pay | Admitting: Cardiology

## 2023-05-08 LAB — COMPREHENSIVE METABOLIC PANEL
ALT: 10 IU/L (ref 0–44)
AST: 13 IU/L (ref 0–40)
Albumin: 4.1 g/dL (ref 3.8–4.9)
Alkaline Phosphatase: 96 IU/L (ref 44–121)
BUN/Creatinine Ratio: 18 (ref 10–24)
BUN: 24 mg/dL (ref 8–27)
Bilirubin Total: 0.4 mg/dL (ref 0.0–1.2)
CO2: 26 mmol/L (ref 20–29)
Calcium: 10.4 mg/dL — ABNORMAL HIGH (ref 8.6–10.2)
Chloride: 99 mmol/L (ref 96–106)
Creatinine, Ser: 1.32 mg/dL — ABNORMAL HIGH (ref 0.76–1.27)
Globulin, Total: 2.4 g/dL (ref 1.5–4.5)
Glucose: 227 mg/dL — ABNORMAL HIGH (ref 70–99)
Potassium: 4.9 mmol/L (ref 3.5–5.2)
Sodium: 142 mmol/L (ref 134–144)
Total Protein: 6.5 g/dL (ref 6.0–8.5)
eGFR: 62 mL/min/{1.73_m2} (ref >59)

## 2023-05-08 LAB — CBC
Hematocrit: 49.5 % (ref 37.5–51.0)
Hemoglobin: 15.5 g/dL (ref 13.0–17.7)
MCH: 27.3 pg (ref 26.6–33.0)
MCHC: 31.3 g/dL — ABNORMAL LOW (ref 31.5–35.7)
MCV: 87 fL (ref 79–97)
Platelets: 320 10*3/uL (ref 150–450)
RBC: 5.67 x10E6/uL (ref 4.14–5.80)
RDW: 14.9 % (ref 11.6–15.4)
WBC: 8.4 10*3/uL (ref 3.4–10.8)

## 2023-05-08 LAB — LIPID PANEL
Cholesterol, Total: 116 mg/dL (ref 100–199)
HDL: 27 mg/dL — ABNORMAL LOW (ref >39)
LDL CALC COMMENT:: 4.3 ratio (ref 0.0–5.0)
LDL Chol Calc (NIH): 63 mg/dL (ref 0–99)
Triglycerides: 147 mg/dL (ref 0–149)
VLDL Cholesterol Cal: 26 mg/dL (ref 5–40)

## 2023-06-05 NOTE — Progress Notes (Signed)
Remote ICD transmission.

## 2023-07-01 ENCOUNTER — Ambulatory Visit: Payer: Medicaid Other

## 2023-07-01 ENCOUNTER — Encounter (HOSPITAL_COMMUNITY): Payer: Medicaid Other

## 2023-07-07 ENCOUNTER — Other Ambulatory Visit: Payer: Self-pay | Admitting: Cardiology

## 2023-07-07 DIAGNOSIS — I480 Paroxysmal atrial fibrillation: Secondary | ICD-10-CM

## 2023-07-08 ENCOUNTER — Ambulatory Visit (INDEPENDENT_AMBULATORY_CARE_PROVIDER_SITE_OTHER)
Admission: RE | Admit: 2023-07-08 | Discharge: 2023-07-08 | Disposition: A | Discharge status: Home / Self Care | Payer: Medicaid Other | Source: Ambulatory Visit | Attending: Vascular Surgery

## 2023-07-08 ENCOUNTER — Ambulatory Visit (HOSPITAL_COMMUNITY)
Admission: RE | Admit: 2023-07-08 | Discharge: 2023-07-08 | Disposition: A | Discharge status: Home / Self Care | Payer: Medicaid Other | Source: Ambulatory Visit | Attending: Vascular Surgery | Admitting: Vascular Surgery

## 2023-07-08 ENCOUNTER — Ambulatory Visit (INDEPENDENT_AMBULATORY_CARE_PROVIDER_SITE_OTHER): Payer: Medicaid Other | Admitting: Physician Assistant

## 2023-07-08 VITALS — BP 144/92 | HR 59 | Temp 97.2°F | Ht 67.0 in | Wt 238.3 lb

## 2023-07-08 DIAGNOSIS — I6523 Occlusion and stenosis of bilateral carotid arteries: Secondary | ICD-10-CM

## 2023-07-08 DIAGNOSIS — I739 Peripheral vascular disease, unspecified: Secondary | ICD-10-CM | POA: Insufficient documentation

## 2023-07-08 DIAGNOSIS — I70213 Atherosclerosis of native arteries of extremities with intermittent claudication, bilateral legs: Secondary | ICD-10-CM

## 2023-07-08 LAB — VAS US ABI WITH/WO TBI
Left ABI: 0.93
Right ABI: 0.88

## 2023-07-08 NOTE — Telephone Encounter (Signed)
 Prescription refill request for Eliquis received. Indication:afib Last office visit:1/25 Scr:1.32  1/25 Age: 63 Weight:106.6  kg  Prescription refilled

## 2023-07-10 NOTE — Progress Notes (Signed)
 Office Note   History of Present Illness   [name] is a 63 y.o. ([birth year]) male who presents for surveillance of PAD.  He has a history of bilateral SFA stenting in 2022 by Dr. Myra Gianotti.  He also has a history of carotid artery stenosis with staged bilateral TCAR's in 2022.  After his interventions, he denies any issues with claudication, rest pain, or tissue loss.  He does have some stable, chronic issues with weakness in his legs.  He returns today for follow-up.  He states he has been doing well since his last office visit.  He denies any claudication, rest pain, or tissue loss.  He says that his weakness in his legs has improved and he can walk up and down his driveway without issue.  He also denies any neurological events.   He takes his daily Plavix, Eliquis, and statin. Current Outpatient Medications  Medication Sig Dispense Refill   ACCU-CHEK GUIDE test strip      apixaban (ELIQUIS) 5 MG TABS tablet Take 1 tablet (5 mg total) by mouth 2 (two) times daily. 60 tablet 1   atorvastatin (LIPITOR) 80 MG tablet TAKE ONE TABLET BY MOUTH DAILY 90 tablet 3   carvedilol (COREG) 25 MG tablet take ONE tablet by MOUTH TWO times daily with a meal 180 tablet 1   clopidogrel (PLAVIX) 75 MG tablet Take 1 tablet (75 mg total) by mouth daily. 30 tablet 11   ELIQUIS 5 MG TABS tablet Take 1 tablet (5 mg total) by mouth 2 (two) times daily. 180 tablet 1   empagliflozin (JARDIANCE) 25 MG TABS tablet Take 25 mg by mouth daily.     furosemide (LASIX) 40 MG tablet Take 1 tablet (40 mg total) by mouth 2 (two) times daily for 14 days. Schedule Appointment for Future Refills [phone] 90 tablet 0   metFORMIN (GLUCOPHAGE) 1000 MG tablet Take 1 tablet (1,000 mg total) by mouth 2 (two) times daily. 60 tablet 0   omeprazole (PRILOSEC OTC) 20 MG tablet Take 20 mg by mouth daily as needed (for reflux).     OXYGEN Inhale 3 L/min into the lungs at bedtime as needed (for shortness of breath).      sacubitril-valsartan (ENTRESTO) 24-26 MG Take 1 tablet by mouth 2 (two) times daily. 28 tablet    spironolactone (ALDACTONE) 25 MG tablet TAKE ONE-HALF TABLET BY MOUTH DAILY 45 tablet 3   TRESIBA FLEXTOUCH 100 UNIT/ML FlexTouch Pen Inject 50 Units into the skin in the morning.     TRULICITY 3 MG/0.5ML SOPN Inject 3 mg into the skin every Wednesday.     No current facility-administered medications for this visit.    REVIEW OF SYSTEMS (negative unless checked):   Cardiac:  []  Chest pain or chest pressure? []  Shortness of breath upon activity? []  Shortness of breath when lying flat? []  Irregular heart rhythm?  Vascular:  []  Pain in calf, thigh, or hip brought on by walking? []  Pain in feet at night that wakes you up from your sleep? []  Blood clot in your veins? []  Leg swelling?  Pulmonary:  []  Oxygen at home? []  Productive cough? []  Wheezing?  Neurologic:  []  Sudden weakness in arms or legs? []  Sudden numbness in arms or legs? []  Sudden onset of difficult speaking or slurred speech? []  Temporary loss of vision in one eye? []  Problems with dizziness?  Gastrointestinal:  []  Blood in stool? []  Vomited blood?  Genitourinary:  []  Burning when urinating? []  Blood in  urine?  Psychiatric:  []  Major depression  Hematologic:  []  Bleeding problems? []  Problems with blood clotting?  Dermatologic:  []  Rashes or ulcers?  Constitutional:  []  Fever or chills?  Ear/Nose/Throat:  []  Change in hearing? []  Nose bleeds? []  Sore throat?  Musculoskeletal:  []  Back pain? []  Joint pain? []  Muscle pain?   Physical Examination   Vitals:   07/08/23 1449  BP: (!) 144/92  Pulse: (!) 59  Temp: (!) 97.2 F (36.2 C)  SpO2: 96%  Weight: 238 lb 4.8 oz (108.1 kg)  Height: 5\' 7"  (1.702 m)   Body mass index is 37.32 kg/m.  General:  WDWN in NAD; vital signs documented above Gait: Not observed HENT: WNL, normocephalic Pulmonary: normal non-labored breathing , without rales,  rhonchi,  wheezing Cardiac: Regular Abdomen: soft, NT, no masses Skin: without rashes Vascular Exam/Pulses: No palpable pedal pulses, brisk Doppler signals bilaterally Extremities: without ischemic changes, without gangrene , without cellulitis; without open wounds;  Musculoskeletal: no muscle wasting or atrophy  Neurologic: A&O X 3;  No focal weakness or paresthesias are detected Psychiatric:  The pt has Normal affect.  Non-Invasive Vascular imaging   ABI (07/08/2023) R:  ABI: 0.88 (0.75),  PT: mono DP: bi TBI: 0.69 L:  ABI: 0.93 (0.66),  PT: bi DP: mono TBI: 0.64   Bilateral lower extremity arterial Duplex (07/08/2023) Patent bilateral SFA stents without stenosis.  Stable 50% stenosis in distal right common femoral artery.  Newly visualized 50% stenosis of proximal left SFA.  Medical Decision Making   [name] is a 63 y.o. male who presents for surveillance of PAD  Based on the patient's vascular studies, his ABIs appear improved since his last visit.  His right ABI is 0.88 and left ABI 0.93 Arterial duplex of bilateral lower extremities demonstrates patent SFA stents without stenosis.  There is stable stenosis in the distal right common femoral artery at 50%.  There is newly visualized stenosis in the proximal left SFA at about 50%.  Peak systolic velocities in the 300s.  This does not appear flow-limiting and we can monitor this stenosis at this time He denies any claudication, rest pain, or tissue loss.  Overall he is feeling well and says that his weakness in his legs has improved On exam he has nonpalpable pedal pulses.  He does have brisk Doppler signals in both feet He can follow-up with our office in 6 months with carotid duplex, ABIs, and bilateral lower extremity arterial duplex   Loel Dubonnet PA-C [facility] Office: [phone]  Clinic MD: Myra Gianotti

## 2023-07-11 ENCOUNTER — Other Ambulatory Visit: Payer: Self-pay | Admitting: *Deleted

## 2023-07-11 DIAGNOSIS — I6523 Occlusion and stenosis of bilateral carotid arteries: Secondary | ICD-10-CM

## 2023-07-11 DIAGNOSIS — I70213 Atherosclerosis of native arteries of extremities with intermittent claudication, bilateral legs: Secondary | ICD-10-CM

## 2023-07-11 DIAGNOSIS — I739 Peripheral vascular disease, unspecified: Secondary | ICD-10-CM

## 2023-07-29 ENCOUNTER — Ambulatory Visit: Payer: Medicaid Other

## 2023-07-29 DIAGNOSIS — I255 Ischemic cardiomyopathy: Secondary | ICD-10-CM

## 2023-07-29 DIAGNOSIS — I5022 Chronic systolic (congestive) heart failure: Secondary | ICD-10-CM

## 2023-07-29 LAB — CUP PACEART REMOTE DEVICE CHECK
Date Time Interrogation Session: 20250407090740
Implantable Lead Connection Status: 753985
Implantable Lead Implant Date: 20220706
Implantable Lead Location: 753860
Implantable Lead Model: 436909
Implantable Lead Serial Number: 81441117
Implantable Pulse Generator Implant Date: 20220706
Pulse Gen Model: 429525
Pulse Gen Serial Number: 84841030

## 2023-09-09 NOTE — Progress Notes (Signed)
 Remote ICD transmission.

## 2023-09-20 ENCOUNTER — Other Ambulatory Visit: Payer: Self-pay | Admitting: Cardiology

## 2023-10-16 ENCOUNTER — Encounter (HOSPITAL_COMMUNITY): Payer: Self-pay | Admitting: *Deleted

## 2023-10-16 ENCOUNTER — Other Ambulatory Visit: Payer: Self-pay

## 2023-10-16 ENCOUNTER — Emergency Department (HOSPITAL_COMMUNITY)

## 2023-10-16 ENCOUNTER — Emergency Department (HOSPITAL_COMMUNITY)
Admission: EM | Admit: 2023-10-16 | Discharge: 2023-10-17 | Disposition: A | Discharge status: Home / Self Care | Attending: Emergency Medicine | Admitting: Emergency Medicine

## 2023-10-16 DIAGNOSIS — Z7984 Long term (current) use of oral hypoglycemic drugs: Secondary | ICD-10-CM | POA: Diagnosis not present

## 2023-10-16 DIAGNOSIS — Z7901 Long term (current) use of anticoagulants: Secondary | ICD-10-CM | POA: Diagnosis not present

## 2023-10-16 DIAGNOSIS — L089 Local infection of the skin and subcutaneous tissue, unspecified: Secondary | ICD-10-CM

## 2023-10-16 DIAGNOSIS — Z7902 Long term (current) use of antithrombotics/antiplatelets: Secondary | ICD-10-CM | POA: Diagnosis not present

## 2023-10-16 DIAGNOSIS — E11621 Type 2 diabetes mellitus with foot ulcer: Secondary | ICD-10-CM | POA: Insufficient documentation

## 2023-10-16 DIAGNOSIS — Z794 Long term (current) use of insulin: Secondary | ICD-10-CM | POA: Insufficient documentation

## 2023-10-16 DIAGNOSIS — L97529 Non-pressure chronic ulcer of other part of left foot with unspecified severity: Secondary | ICD-10-CM | POA: Insufficient documentation

## 2023-10-16 LAB — COMPREHENSIVE METABOLIC PANEL WITH GFR
ALT: 15 U/L (ref 0–44)
AST: 15 U/L (ref 15–41)
Albumin: 3.4 g/dL — ABNORMAL LOW (ref 3.5–5.0)
Alkaline Phosphatase: 68 U/L (ref 38–126)
Anion gap: 11 (ref 5–15)
BUN: 21 mg/dL (ref 8–23)
CO2: 26 mmol/L (ref 22–32)
Calcium: 9.6 mg/dL (ref 8.9–10.3)
Chloride: 100 mmol/L (ref 98–111)
Creatinine, Ser: 1.43 mg/dL — ABNORMAL HIGH (ref 0.61–1.24)
GFR, Estimated: 56 mL/min — ABNORMAL LOW (ref >60)
Glucose, Bld: 264 mg/dL — ABNORMAL HIGH (ref 70–99)
Potassium: 4.9 mmol/L (ref 3.5–5.1)
Sodium: 137 mmol/L (ref 135–145)
Total Bilirubin: 0.6 mg/dL (ref 0.0–1.2)
Total Protein: 7.2 g/dL (ref 6.5–8.1)

## 2023-10-16 LAB — CBC WITH DIFFERENTIAL/PLATELET
Abs Immature Granulocytes: 0.02 10*3/uL (ref 0.00–0.07)
Basophils Absolute: 0.1 10*3/uL (ref 0.0–0.1)
Basophils Relative: 1 %
Eosinophils Absolute: 0.2 10*3/uL (ref 0.0–0.5)
Eosinophils Relative: 2 %
HCT: 48.6 % (ref 39.0–52.0)
Hemoglobin: 15.4 g/dL (ref 13.0–17.0)
Immature Granulocytes: 0 %
Lymphocytes Relative: 16 %
Lymphs Abs: 1.4 10*3/uL (ref 0.7–4.0)
MCH: 29 pg (ref 26.0–34.0)
MCHC: 31.7 g/dL (ref 30.0–36.0)
MCV: 91.5 fL (ref 80.0–100.0)
Monocytes Absolute: 0.8 10*3/uL (ref 0.1–1.0)
Monocytes Relative: 10 %
Neutro Abs: 6 10*3/uL (ref 1.7–7.7)
Neutrophils Relative %: 71 %
Platelets: 298 10*3/uL (ref 150–400)
RBC: 5.31 MIL/uL (ref 4.22–5.81)
RDW: 16.8 % — ABNORMAL HIGH (ref 11.5–15.5)
WBC: 8.5 10*3/uL (ref 4.0–10.5)
nRBC: 0 % (ref 0.0–0.2)

## 2023-10-16 LAB — I-STAT CG4 LACTIC ACID, ED
Lactic Acid, Venous: 2.9 mmol/L (ref 0.5–1.9)
Lactic Acid, Venous: 2.2 mmol/L (ref 0.5–1.9)

## 2023-10-16 LAB — PROTIME-INR
INR: 1.2 (ref 0.8–1.2)
Prothrombin Time: 16.3 s — ABNORMAL HIGH (ref 11.4–15.2)

## 2023-10-16 MED ORDER — AMOXICILLIN-POT CLAVULANATE 875-125 MG PO TABS
1.0000 | ORAL_TABLET | Freq: Once | ORAL | Status: AC
  Administered 2023-10-17: 1 via ORAL
  Filled 2023-10-16: qty 1

## 2023-10-16 MED ORDER — AMOXICILLIN-POT CLAVULANATE 875-125 MG PO TABS: 1.0000 | ORAL_TABLET | Freq: Two times a day (BID) | ORAL | 0 refills | Status: AC

## 2023-10-16 NOTE — ED Provider Notes (Addendum)
 [place] EMERGENCY DEPARTMENT AT [facility] Provider Note   CSN: [id number] Arrival date & time: 10/16/23  1718     Patient presents with: Foot Ulcer   [name] is a 63 y.o. male.   Patient is a known diabetic.  Patient was seen in urgent care and [place].  He has had a wound to the lateral aspect of his left foot down in the metatarsal area toe area small toe area for about 2 weeks.  He was sent in because they were concerned about osteomyelitis.  Here temp is 97.9 pulse 58 respiration 16 blood pressure 124/68 oxygen saturation is 93% on room air.  Patient is on Eliquis .  Patient has a history of atrial fibs.  Patient is also on Coreg .  Patient is followed by cardiology for the atrial fibs.  Past medical history sniffer hypertension diabetes hyperlipidemia coronary artery disease peripheral vascular disease automatic cardioverter defibrillator present history of congestive heart failure.  Past surgical history significant coronary angioplasty with stent coronary artery bypass graft in 2021 peripheral vascular intervention left popliteal left SFA in 2022 also had a balloon angioplasty done by Dr. Penne right superficial femoral artery.  Patient former smoker quit in 2021.  Patient states that he takes insulin  and pills for his diabetes.  Looks like he is on metformin  he is on Trulicity .  Patient denies any fevers denies any significant foot pain.  Patient's been cleaning the wound with alcohol at home.         Prior to Admission medications   Medication Sig Start Date End Date Taking? Authorizing Provider  ACCU-CHEK GUIDE test strip  03/10/21   [provider]  apixaban  (ELIQUIS ) 5 MG TABS tablet Take 1 tablet (5 mg total) by mouth 2 (two) times daily. 04/05/23   Pietro Redell RAMAN, MD  atorvastatin  (LIPITOR ) 80 MG tablet TAKE ONE TABLET BY MOUTH DAILY 02/04/23   Pietro Redell RAMAN, MD  carvedilol  (COREG ) 25 MG tablet TAKE ONE TABLET BY MOUTH TWICE DAILY  WITH A MEAL 09/20/23   Pietro Redell RAMAN, MD  clopidogrel  (PLAVIX ) 75 MG tablet Take 1 tablet (75 mg total) by mouth daily. 12/25/22   Serene Gaile ORN, MD  ELIQUIS  5 MG TABS tablet Take 1 tablet (5 mg total) by mouth 2 (two) times daily. 07/08/23   Pietro Redell RAMAN, MD  empagliflozin  (JARDIANCE ) 25 MG TABS tablet Take 25 mg by mouth daily.    [provider]  furosemide  (LASIX ) 40 MG tablet Take 1 tablet (40 mg total) by mouth 2 (two) times daily for 14 days. Schedule Appointment for Future Refills [phone] 04/04/23 04/18/23  Meng, Hao, PA  metFORMIN  (GLUCOPHAGE ) 1000 MG tablet Take 1 tablet (1,000 mg total) by mouth 2 (two) times daily. 10/31/22 11/30/22  Fernand Prost, MD  omeprazole (PRILOSEC OTC) 20 MG tablet Take 20 mg by mouth daily as needed (for reflux).    [provider]  OXYGEN Inhale 3 L/min into the lungs at bedtime as needed (for shortness of breath).    [provider]  sacubitril -valsartan  (ENTRESTO ) 24-26 MG Take 1 tablet by mouth 2 (two) times daily. 04/29/23   Pietro Redell RAMAN, MD  spironolactone  (ALDACTONE ) 25 MG tablet TAKE ONE-HALF TABLET BY MOUTH DAILY 01/31/23   Meng, Hao, PA  TRESIBA  FLEXTOUCH 100 UNIT/ML FlexTouch Pen Inject 50 Units into the skin in the morning. 08/17/20   [provider]  TRULICITY  3 MG/0.5ML SOPN Inject 3 mg into the skin every Wednesday. 10/19/22  [provider]    Allergies: Codeine, Gabapentin, Pregabalin, Simvastatin , Lisinopril, Morphine , and Venlafaxine    Review of Systems  Constitutional:  Negative for chills and fever.  HENT:  Negative for ear pain and sore throat.   Eyes:  Negative for pain and visual disturbance.  Respiratory:  Negative for cough and shortness of breath.   Cardiovascular:  Negative for chest pain and palpitations.  Gastrointestinal:  Negative for abdominal pain and vomiting.  Genitourinary:  Negative for dysuria and hematuria.  Musculoskeletal:  Negative for arthralgias and back  pain.  Skin:  Positive for wound. Negative for color change and rash.  Neurological:  Negative for seizures and syncope.  All other systems reviewed and are negative.   Updated Vital Signs BP 124/68 (BP Location: Right Arm)   Pulse (!) 58   Temp 97.9 F (36.6 C)   Resp 16   Ht 1.702 m (5' 7)   Wt 108.1 kg   SpO2 93%   BMI 37.33 kg/m   Physical Exam Vitals and nursing note reviewed.  Constitutional:      General: He is not in acute distress.    Appearance: Normal appearance. He is well-developed. He is not ill-appearing.  HENT:     Head: Normocephalic and atraumatic.     Mouth/Throat:     Mouth: Mucous membranes are moist.   Eyes:     Conjunctiva/sclera: Conjunctivae normal.    Cardiovascular:     Rate and Rhythm: Normal rate and regular rhythm.     Heart sounds: No murmur heard. Pulmonary:     Effort: Pulmonary effort is normal. No respiratory distress.     Breath sounds: Normal breath sounds.  Abdominal:     Palpations: Abdomen is soft.     Tenderness: There is no abdominal tenderness.   Musculoskeletal:        General: No swelling.     Cervical back: Normal range of motion and neck supple.     Comments: Left lateral foot along the fifth metatarsal some erythema with exudate sore to dried and of a superficial linear abrasion that measures probably about 6 cm.  Good cap refill to the toes.  No tenderness to palpation.   Skin:    General: Skin is warm and dry.     Capillary Refill: Capillary refill takes less than 2 seconds.   Neurological:     General: No focal deficit present.     Mental Status: He is alert and oriented to person, place, and time.   Psychiatric:        Mood and Affect: Mood normal.     (all labs ordered are listed, but only abnormal results are displayed) Labs Reviewed  I-STAT CG4 LACTIC ACID, ED - Abnormal; Notable for the following components:      Result Value   Lactic Acid, Venous 2.9 (*)    All other components within normal  limits  CULTURE, BLOOD (ROUTINE X 2)  CULTURE, BLOOD (ROUTINE X 2)  CBC WITH DIFFERENTIAL/PLATELET  COMPREHENSIVE METABOLIC PANEL WITH GFR  PROTIME-INR    EKG: None  Radiology: No results found.   Procedures   Medications Ordered in the ED - No data to display                                  Medical Decision Making Risk Prescription drug management.   Patient's lactic acid 2.9.  However vital signs here  are reassuring with temp 97.9.  White count is 8.5's not elevated hemoglobin 15.4 platelets 298.  INR 1.2.  Complete metabolic panel pending.  X-ray of the left foot pending.  EKG shows normal sinus rhythm rate of 94.  Patient's vital signs remain normal.  No fever.  Patient's lactic acid first 1 was 2.9 repeat was 2.2.  White count though normal at 8.5 hemoglobin 15.4 platelets 298.  Complete metabolic panel GFR 56 glucose 736 CO2 though 26 and no signs of any acidosis.  INR was normal at 1.2.  X-ray of the left foot was negative there was no evidence of fracture or dislocation there is no evidence of arthropathy or any focal bony abnormality soft tissues are unremarkable.  Since this is been ongoing for only the 2 weeks.  Does not seem to be any evidence of osteomyelitis.  Will treat as a diabetic foot ulcer with Augmentin.  Have him follow-up with his doctors.  Workup here today very reassuring.  Final diagnoses:  None    ED Discharge Orders     None          Micala Saltsman, MD 10/16/23 1925    Geraldene Hamilton, MD 10/16/23 2340    Geraldene Hamilton, MD 10/16/23 2352

## 2023-10-16 NOTE — ED Notes (Signed)
 Extra DG, SST & Red top drawn

## 2023-10-16 NOTE — ED Provider Triage Note (Signed)
 Emergency Medicine Provider Triage Evaluation Note  [name] , a 63 y.o. male  was evaluated in triage.  Pt complains of left foot wound that is been ongoing for the past 2 weeks.  Evaluated by urgent care today and sent here to rule out osteomyelitis.  Not endorsing any fever or chills.  Review of Systems  Positive: Wound Negative: Fever, chills  Physical Exam  There were no vitals taken for this visit. Gen:   Awake, no distress   Resp:  Normal effort  MSK:   Moves extremities without difficulty  Other:  Visualized wound on patient's phone, contains some hair noted, surrounding erythema.  Medical Decision Making  Medically screening exam initiated at 6:13 PM.  Appropriate orders placed.  [name] was informed that the remainder of the evaluation will be completed by another provider, this initial triage assessment does not replace that evaluation, and the importance of remaining in the ED until their evaluation is complete.     Ha Placeres, PA-C 10/16/23 1823

## 2023-10-16 NOTE — ED Triage Notes (Signed)
 The pt is c/o a sore on his lt foot  he was seen at urgent care in [place] and the pt was sent here.  He is a diabetic

## 2023-10-16 NOTE — Discharge Instructions (Signed)
 Workup here very reassuring.  No evidence of any bony infection.  Take the Augmentin as directed twice a day for the next 7 days.  First dose given to you here tonight.  Would soak the foot in warm water  not hot for 20 minutes twice a day.  Then pat dry.  Put on clean socks.  Make an appointment to follow-up with your primary care doctor.  Return for any new or worse symptoms.  And as you stated following up with your podiatrist would be excellent for them to help you follow the wound.

## 2023-10-17 LAB — CULTURE, BLOOD (ROUTINE X 2): Culture: NO GROWTH

## 2023-10-21 LAB — CULTURE, BLOOD (ROUTINE X 2)
Culture: NO GROWTH
Special Requests: ADEQUATE
Special Requests: ADEQUATE

## 2023-10-28 ENCOUNTER — Ambulatory Visit: Payer: Medicaid Other

## 2023-10-28 DIAGNOSIS — I5022 Chronic systolic (congestive) heart failure: Secondary | ICD-10-CM | POA: Diagnosis not present

## 2023-10-28 LAB — CUP PACEART REMOTE DEVICE CHECK
Date Time Interrogation Session: 20250707120252
Implantable Lead Connection Status: 753985
Implantable Lead Implant Date: 20220706
Implantable Lead Location: 753860
Implantable Lead Model: 436909
Implantable Lead Serial Number: 81441117
Implantable Pulse Generator Implant Date: 20220706
Pulse Gen Model: 429525
Pulse Gen Serial Number: 84841030

## 2023-10-31 ENCOUNTER — Ambulatory Visit: Payer: Self-pay | Admitting: Internal Medicine

## 2023-11-11 ENCOUNTER — Other Ambulatory Visit: Payer: Self-pay | Admitting: *Deleted

## 2023-11-11 MED ORDER — FUROSEMIDE 40 MG PO TABS: 40.0000 mg | ORAL_TABLET | Freq: Every day | ORAL | 1 refills | Status: DC

## 2024-01-02 ENCOUNTER — Other Ambulatory Visit: Payer: Self-pay | Admitting: Cardiology

## 2024-01-02 DIAGNOSIS — I480 Paroxysmal atrial fibrillation: Secondary | ICD-10-CM

## 2024-01-02 NOTE — Telephone Encounter (Signed)
 Prescription refill request for Eliquis  received. Indication:afib Last office visit:1/25 Scr:1.43  6/25 Age: 62 Weight:108.1  kg  Prescription refilled

## 2024-01-06 ENCOUNTER — Ambulatory Visit (HOSPITAL_BASED_OUTPATIENT_CLINIC_OR_DEPARTMENT_OTHER)
Admission: RE | Admit: 2024-01-06 | Discharge: 2024-01-06 | Disposition: A | Discharge status: Home / Self Care | Source: Ambulatory Visit | Attending: Physician Assistant | Admitting: Physician Assistant

## 2024-01-06 ENCOUNTER — Ambulatory Visit (HOSPITAL_BASED_OUTPATIENT_CLINIC_OR_DEPARTMENT_OTHER)
Admission: RE | Admit: 2024-01-06 | Discharge: 2024-01-06 | Disposition: A | Discharge status: Home / Self Care | Source: Ambulatory Visit | Attending: Physician Assistant

## 2024-01-06 ENCOUNTER — Ambulatory Visit: Admitting: Physician Assistant

## 2024-01-06 ENCOUNTER — Ambulatory Visit (HOSPITAL_COMMUNITY)
Admission: RE | Admit: 2024-01-06 | Discharge: 2024-01-06 | Disposition: A | Discharge status: Home / Self Care | Source: Ambulatory Visit | Attending: Physician Assistant | Admitting: Physician Assistant

## 2024-01-06 VITALS — BP 139/82 | HR 50 | Temp 97.7°F | Wt 232.3 lb

## 2024-01-06 DIAGNOSIS — I70213 Atherosclerosis of native arteries of extremities with intermittent claudication, bilateral legs: Secondary | ICD-10-CM | POA: Insufficient documentation

## 2024-01-06 DIAGNOSIS — I6523 Occlusion and stenosis of bilateral carotid arteries: Secondary | ICD-10-CM | POA: Diagnosis present

## 2024-01-06 DIAGNOSIS — I739 Peripheral vascular disease, unspecified: Secondary | ICD-10-CM | POA: Insufficient documentation

## 2024-01-06 LAB — VAS US ABI WITH/WO TBI
Left ABI: 0.9
Right ABI: 0.81

## 2024-01-07 ENCOUNTER — Other Ambulatory Visit: Payer: Self-pay | Admitting: *Deleted

## 2024-01-07 DIAGNOSIS — I70213 Atherosclerosis of native arteries of extremities with intermittent claudication, bilateral legs: Secondary | ICD-10-CM

## 2024-01-07 DIAGNOSIS — I739 Peripheral vascular disease, unspecified: Secondary | ICD-10-CM

## 2024-01-10 NOTE — Progress Notes (Signed)
 Office Note   History of Present Illness   [name] is a 63 y.o. (12-22-1960) male who presents for surveillance of PAD.  He has a history of bilateral SFA stenting in 2022 by Dr. Serene.  He also has a history of carotid artery stenosis with staged bilateral TCAR's in 2022.  After his interventions, he denies any issues with claudication, rest pain, or tissue loss.   He returns today for follow-up.  He has no complaints at today's visit.  He denies any strokelike symptoms such as slurred speech, facial droop, sudden vision changes, or sudden unilateral weakness/numbness.  He also denies any claudication, rest pain, or tissue loss.  He says he did have a wound on his left lateral foot about a month ago after cutting it on a lawnmower.  This wound healed up about 2 weeks ago.  He denies any issues with wound healing or infection.  Current Outpatient Medications  Medication Sig Dispense Refill   ACCU-CHEK GUIDE test strip      amoxicillin -clavulanate (AUGMENTIN ) 875-125 MG tablet Take 1 tablet by mouth every 12 (twelve) hours. 14 tablet 0   apixaban  (ELIQUIS ) 5 MG TABS tablet Take 1 tablet (5 mg total) by mouth 2 (two) times daily. 60 tablet 1   atorvastatin  (LIPITOR ) 80 MG tablet TAKE ONE TABLET BY MOUTH DAILY 90 tablet 3   carvedilol  (COREG ) 25 MG tablet TAKE ONE TABLET BY MOUTH TWICE DAILY WITH A MEAL 180 tablet 2   clopidogrel  (PLAVIX ) 75 MG tablet Take 1 tablet (75 mg total) by mouth daily. 30 tablet 11   ELIQUIS  5 MG TABS tablet Take 1 tablet by mouth 2 times daily. 180 tablet 1   empagliflozin  (JARDIANCE ) 25 MG TABS tablet Take 25 mg by mouth daily.     furosemide  (LASIX ) 40 MG tablet Take 1 tablet (40 mg total) by mouth daily. 90 tablet 1   metFORMIN  (GLUCOPHAGE ) 1000 MG tablet Take 1 tablet (1,000 mg total) by mouth 2 (two) times daily. 60 tablet 0   omeprazole (PRILOSEC OTC) 20 MG tablet Take 20 mg by mouth daily as needed (for reflux).     OXYGEN Inhale 3 L/min into the  lungs at bedtime as needed (for shortness of breath).     sacubitril -valsartan  (ENTRESTO ) 24-26 MG Take 1 tablet by mouth 2 (two) times daily. 28 tablet    spironolactone  (ALDACTONE ) 25 MG tablet TAKE ONE-HALF TABLET BY MOUTH DAILY 45 tablet 3   TRESIBA  FLEXTOUCH 100 UNIT/ML FlexTouch Pen Inject 50 Units into the skin in the morning.     TRULICITY  3 MG/0.5ML SOPN Inject 3 mg into the skin every Wednesday.     No current facility-administered medications for this visit.    REVIEW OF SYSTEMS (negative unless checked):   Cardiac:  []  Chest pain or chest pressure? []  Shortness of breath upon activity? []  Shortness of breath when lying flat? []  Irregular heart rhythm?  Vascular:  []  Pain in calf, thigh, or hip brought on by walking? []  Pain in feet at night that wakes you up from your sleep? []  Blood clot in your veins? []  Leg swelling?  Pulmonary:  []  Oxygen at home? []  Productive cough? []  Wheezing?  Neurologic:  []  Sudden weakness in arms or legs? []  Sudden numbness in arms or legs? []  Sudden onset of difficult speaking or slurred speech? []  Temporary loss of vision in one eye? []  Problems with dizziness?  Gastrointestinal:  []  Blood in stool? []  Vomited blood?  Genitourinary:  []  Burning when urinating? []  Blood in urine?  Psychiatric:  []  Major depression  Hematologic:  []  Bleeding problems? []  Problems with blood clotting?  Dermatologic:  []  Rashes or ulcers?  Constitutional:  []  Fever or chills?  Ear/Nose/Throat:  []  Change in hearing? []  Nose bleeds? []  Sore throat?  Musculoskeletal:  []  Back pain? []  Joint pain? []  Muscle pain?   Physical Examination   Vitals:   01/06/24 1436 01/06/24 1438  BP: (!) 160/91 139/82  Pulse: 60 (!) 50  Temp: 97.7 F (36.5 C)   TempSrc: Temporal   Weight: 232 lb 4.8 oz (105.4 kg)    Body mass index is 36.38 kg/m.  General:  WDWN in NAD; vital signs documented above Gait: Not observed HENT: WNL,  normocephalic Pulmonary: normal non-labored breathing  Cardiac: regular Abdomen: soft, NT, no masses Skin: without rashes Vascular Exam/Pulses: Nonpalpable pedal pulses, brisk DP/PT Doppler signals bilaterally Extremities: without ischemic changes, without gangrene , without cellulitis; without open wounds; healed wound on the left lateral foot Musculoskeletal: no muscle wasting or atrophy  Neurologic: A&O X 3;  No focal weakness or paresthesias are detected Psychiatric:  The pt has Normal affect.  Non-Invasive Vascular imaging   ABI (01/06/2024) R:  ABI: 0.81 (0.88),  PT: bi DP: bi TBI: 0.63 L:  ABI: 0.9 (0.93),  PT: bi DP: bi TBI: 0.64  Bilateral lower extremity arterial duplex (01/06/2024) +-----------+--------+-----+---------------+--------+--------+  RIGHT     PSV cm/sRatioStenosis       WaveformComments  +-----------+--------+-----+---------------+--------+--------+  CFA Distal 245          50-74% stenosisbiphasic          +-----------+--------+-----+---------------+--------+--------+  DFA       188          30-49% stenosisbiphasic          +-----------+--------+-----+---------------+--------+--------+  SFA Prox   220          50-74% stenosisbiphasic          +-----------+--------+-----+---------------+--------+--------+  SFA Mid    103                                           +-----------+--------+-----+---------------+--------+--------+  TP Trunk   48                          biphasic          +-----------+--------+-----+---------------+--------+--------+  ATA Distal 37                          biphasic          +-----------+--------+-----+---------------+--------+--------+  PTA Distal 50                          biphasic          +-----------+--------+-----+---------------+--------+--------+  PERO Distal41                          biphasic           +-----------+--------+-----+---------------+--------+--------+       Right Stent(s):  +---------------+--------+---------------+--------+--------+  SFA-POP       PSV cm/sStenosis       WaveformComments  +---------------+--------+---------------+--------+--------+  Prox to Stent  79  biphasic          +---------------+--------+---------------+--------+--------+  Proximal Stent 253     50-99% stenosisbiphasic          +---------------+--------+---------------+--------+--------+  Mid Stent      87                     biphasic          +---------------+--------+---------------+--------+--------+  Distal Stent   119                    biphasic          +---------------+--------+---------------+--------+--------+  Distal to Stent99                     biphasic          +---------------+--------+---------------+--------+--------+            +-----------+--------+-----+---------------+----------+--------+  LEFT      PSV cm/sRatioStenosis       Waveform  Comments  +-----------+--------+-----+---------------+----------+--------+  CFA Distal 143                         biphasic            +-----------+--------+-----+---------------+----------+--------+  DFA       93                          biphasic            +-----------+--------+-----+---------------+----------+--------+  SFA Prox   348          50-74% stenosisbiphasic            +-----------+--------+-----+---------------+----------+--------+  SFA Mid    105                         biphasic            +-----------+--------+-----+---------------+----------+--------+  TP Trunk   51                          biphasic            +-----------+--------+-----+---------------+----------+--------+  ATA Distal 12                          monophasic          +-----------+--------+-----+---------------+----------+--------+  PTA Distal 71                           biphasic            +-----------+--------+-----+---------------+----------+--------+  PERO Distal             occluded                           +-----------+--------+-----+---------------+----------+--------+     Left Stent(s):  +---------------+--------+--------+--------+--------+  SFA-POP       PSV cm/sStenosisWaveformComments  +---------------+--------+--------+--------+--------+  Prox to Stent  87              biphasic          +---------------+--------+--------+--------+--------+  Proximal Stent 73              biphasic          +---------------+--------+--------+--------+--------+  Mid Stent      102  biphasic          +---------------+--------+--------+--------+--------+  Distal Stent   92              biphasic          +---------------+--------+--------+--------+--------+  Distal to Stent84              biphasic          +---------------+--------+--------+--------+--------+   Carotid Duplex (01/06/2024) Patent bilateral internal carotid artery stents without stenosis  Medical Decision Making   DARLY FAILS is a 63 y.o. male who presents for surveillance of PAD and carotid artery stenosis  Based on the patient's vascular studies, his ABIs are essentially unchanged from his last office visit.  His right ABI is 0.81 and left ABI is 0.9 Arterial duplex of bilateral lower extremities demonstrates patent SFA stents without hemodynamically significant stenosis.  There is unchanged 50% stenosis in the distal right common femoral artery and proximal SFA. There appears to be a new 50% stenosis of the proximal right SFA stent, with a PSV of 253 cm/s.  There is unchanged 50% stenosis of the proximal left SFA.  None of the stenoses in bilateral lower extremities appears flow-limiting. Carotid duplex demonstrates patent bilateral internal carotid artery stents without stenosis He denies any strokelike  symptoms since his last office visit.  He also denies any claudication or rest pain.  He says he did have a wound on his left lateral foot a month ago after injuring it on a lawnmower.  This healed without complications about 2 weeks ago.  He denies any other tissue loss. On exam he is neurologically intact.  He has a healed left lateral foot wound.  He has brisk DP/PT Doppler signals bilaterally. I do not think that the patient warrants intervention at this time on either of his stents.  His stenosis is overall stable and not flow-limiting.  If his stenosis worsens within the stents, with velocities above 300, we should likely pursue angiogram. He can follow-up with our office in 6 months with repeat ABIs and bilateral lower extremity arterial duplex.  His carotid duplex can be repeated in 1 year   Ahmed Holster PA-C [facility] Office: [phone]  Clinic MD: Se[facility]

## 2024-01-12 ENCOUNTER — Other Ambulatory Visit: Payer: Self-pay | Admitting: Physician Assistant

## 2024-01-14 ENCOUNTER — Other Ambulatory Visit: Payer: Self-pay | Admitting: Cardiology

## 2024-01-14 DIAGNOSIS — E7800 Pure hypercholesterolemia, unspecified: Secondary | ICD-10-CM

## 2024-01-27 ENCOUNTER — Ambulatory Visit (INDEPENDENT_AMBULATORY_CARE_PROVIDER_SITE_OTHER): Payer: Medicaid Other

## 2024-01-27 DIAGNOSIS — I480 Paroxysmal atrial fibrillation: Secondary | ICD-10-CM | POA: Diagnosis not present

## 2024-01-29 LAB — CUP PACEART REMOTE DEVICE CHECK
Date Time Interrogation Session: 20251006112318
Implantable Lead Connection Status: 753985
Implantable Lead Implant Date: 20220706
Implantable Lead Location: 753860
Implantable Lead Model: 436909
Implantable Lead Serial Number: 81441117
Implantable Pulse Generator Implant Date: 20220706
Pulse Gen Model: 429525
Pulse Gen Serial Number: 84841030

## 2024-01-30 ENCOUNTER — Ambulatory Visit: Payer: Self-pay | Admitting: Internal Medicine

## 2024-01-30 NOTE — Progress Notes (Signed)
 Remote ICD Transmission

## 2024-02-03 NOTE — Progress Notes (Signed)
 Remote ICD Transmission

## 2024-02-14 ENCOUNTER — Other Ambulatory Visit: Payer: Self-pay | Admitting: *Deleted

## 2024-02-14 DIAGNOSIS — I480 Paroxysmal atrial fibrillation: Secondary | ICD-10-CM

## 2024-02-14 MED ORDER — APIXABAN 5 MG PO TABS: 5.0000 mg | ORAL_TABLET | Freq: Two times a day (BID) | ORAL | 1 refills | Status: AC

## 2024-02-14 NOTE — Telephone Encounter (Signed)
 Eliquis  5mg  refill request received. Patient is 63 years old, weight-105.4kg, Crea-1.43 on 10/16/23, Diagnosis-Afib, and last seen by Dr. Pietro on 04/29/23. Dose is appropriate based on dosing criteria. Will send in refill to requested pharmacy.

## 2024-04-01 ENCOUNTER — Other Ambulatory Visit: Payer: Self-pay | Admitting: Cardiology

## 2024-04-01 DIAGNOSIS — I5022 Chronic systolic (congestive) heart failure: Secondary | ICD-10-CM

## 2024-04-14 ENCOUNTER — Other Ambulatory Visit: Payer: Self-pay | Admitting: Cardiology

## 2024-04-14 DIAGNOSIS — I5022 Chronic systolic (congestive) heart failure: Secondary | ICD-10-CM

## 2024-04-27 ENCOUNTER — Ambulatory Visit: Payer: Medicaid Other

## 2024-04-27 DIAGNOSIS — I480 Paroxysmal atrial fibrillation: Secondary | ICD-10-CM | POA: Diagnosis not present

## 2024-04-28 ENCOUNTER — Ambulatory Visit: Payer: Self-pay | Admitting: Cardiology

## 2024-04-28 LAB — CUP PACEART REMOTE DEVICE CHECK
Date Time Interrogation Session: 20260105091800
Implantable Lead Connection Status: 753985
Implantable Lead Implant Date: 20220706
Implantable Lead Location: 753860
Implantable Lead Model: 436909
Implantable Lead Serial Number: 81441117
Implantable Pulse Generator Implant Date: 20220706
Pulse Gen Model: 429525
Pulse Gen Serial Number: 84841030

## 2024-04-30 NOTE — Progress Notes (Signed)
 Remote ICD Transmission

## 2024-05-14 ENCOUNTER — Other Ambulatory Visit: Payer: Self-pay | Admitting: Cardiology

## 2024-05-21 NOTE — Telephone Encounter (Signed)
 In accordance with refill protocols, please review and address the following requirements before this medication refill can be authorized:  Labs

## 2024-06-29 ENCOUNTER — Encounter (HOSPITAL_COMMUNITY)

## 2024-06-29 ENCOUNTER — Ambulatory Visit

## 2024-10-26 ENCOUNTER — Encounter

## 2025-01-25 ENCOUNTER — Encounter

## 2025-04-26 ENCOUNTER — Encounter

## 2025-07-26 ENCOUNTER — Encounter

## 2025-10-25 ENCOUNTER — Encounter
